# Patient Record
Sex: Male | Born: 1965 | Race: White | Hispanic: No | Smoking: Never smoker
Health system: Southern US, Community
[De-identification: ages and names within clinical notes are randomized; demographics above are authoritative.]

## PROBLEM LIST (undated history)

## (undated) DIAGNOSIS — L0292 Furuncle, unspecified: Secondary | ICD-10-CM

## (undated) DIAGNOSIS — M869 Osteomyelitis, unspecified: Secondary | ICD-10-CM

## (undated) DIAGNOSIS — E1140 Type 2 diabetes mellitus with diabetic neuropathy, unspecified: Secondary | ICD-10-CM

## (undated) DIAGNOSIS — E349 Endocrine disorder, unspecified: Secondary | ICD-10-CM

## (undated) DIAGNOSIS — I4891 Unspecified atrial fibrillation: Secondary | ICD-10-CM

## (undated) DIAGNOSIS — M14679 Charcot's joint, unspecified ankle and foot: Secondary | ICD-10-CM

## (undated) DIAGNOSIS — L0293 Carbuncle, unspecified: Secondary | ICD-10-CM

## (undated) DIAGNOSIS — I10 Essential (primary) hypertension: Secondary | ICD-10-CM

## (undated) DIAGNOSIS — G63 Polyneuropathy in diseases classified elsewhere: Secondary | ICD-10-CM

## (undated) DIAGNOSIS — I480 Paroxysmal atrial fibrillation: Secondary | ICD-10-CM

## (undated) DIAGNOSIS — I499 Cardiac arrhythmia, unspecified: Secondary | ICD-10-CM

## (undated) HISTORY — DX: Essential (primary) hypertension: I10

## (undated) HISTORY — DX: Paroxysmal atrial fibrillation: I48.0

## (undated) HISTORY — PX: FOOT AMPUTATION: SHX951

## (undated) HISTORY — DX: Endocrine disorder, unspecified: E34.9

## (undated) HISTORY — DX: Polyneuropathy in diseases classified elsewhere: G63

## (undated) HISTORY — PX: HERNIA REPAIR: SHX51

---
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
## 1998-04-14 ENCOUNTER — Encounter: Admission: RE | Admit: 1998-04-14 | Discharge: 1998-07-13 | Payer: Self-pay

## 2003-12-11 ENCOUNTER — Ambulatory Visit (HOSPITAL_BASED_OUTPATIENT_CLINIC_OR_DEPARTMENT_OTHER): Admission: RE | Admit: 2003-12-11 | Discharge: 2003-12-11 | Payer: Self-pay | Admitting: General Surgery

## 2005-06-14 ENCOUNTER — Emergency Department (HOSPITAL_COMMUNITY): Admission: EM | Admit: 2005-06-14 | Discharge: 2005-06-14 | Payer: Self-pay | Admitting: Emergency Medicine

## 2009-01-20 ENCOUNTER — Emergency Department (HOSPITAL_BASED_OUTPATIENT_CLINIC_OR_DEPARTMENT_OTHER): Admission: EM | Admit: 2009-01-20 | Discharge: 2009-01-20 | Payer: Self-pay | Admitting: Emergency Medicine

## 2009-03-14 ENCOUNTER — Emergency Department (HOSPITAL_BASED_OUTPATIENT_CLINIC_OR_DEPARTMENT_OTHER): Admission: EM | Admit: 2009-03-14 | Discharge: 2009-03-14 | Payer: Self-pay | Admitting: Emergency Medicine

## 2009-10-12 ENCOUNTER — Ambulatory Visit: Payer: Self-pay | Admitting: Diagnostic Radiology

## 2009-10-12 ENCOUNTER — Emergency Department (HOSPITAL_BASED_OUTPATIENT_CLINIC_OR_DEPARTMENT_OTHER): Admission: EM | Admit: 2009-10-12 | Discharge: 2009-10-12 | Payer: Self-pay | Admitting: Emergency Medicine

## 2011-02-14 LAB — CULTURE, ROUTINE-ABSCESS

## 2011-03-24 NOTE — Op Note (Signed)
[name], [name]                           ACCOUNT NO.:  [id number]   MEDICAL RECORD NO.:  [id number]                   PATIENT TYPE:  AMB   LOCATION:  DSC                                  FACILITY:  [facility]   PHYSICIAN:  Jimmye Norman III, M.D.               DATE OF BIRTH:  [birth year]   DATE OF PROCEDURE:  12/11/2003  DATE OF DISCHARGE:                                 OPERATIVE REPORT   PREOPERATIVE DIAGNOSIS:  Bilateral inguinal hernias.   POSTOPERATIVE DIAGNOSIS:  Recurrent left inguinal hernia and right inguinal  hernia.   PROCEDURE:  Bilateral inguinal hernia repair with mesh.   SURGEON:  Jimmye Norman, M.D.   ASSISTANT:  None.   ANESTHESIA:  General with a laryngeal airway.   ESTIMATED BLOOD LOSS:  Less than 50 mL.   COMPLICATIONS:  None.   CONDITION:  Stable.   FINDINGS:  The patient had bilateral direct and a right-sided indirect  hernia with a lipoma of the cord.   OPERATION:  The patient was taken to the operating room and placed on the  table in the supine position.  After an adequate laryngeal airway anesthetic  was administered, he was prepped and draped in the usual sterile manner  exposing both groins.   A left-sided incision was made initially at the site of his previous  inguinal hernia repair.  It was taken down to and through the subcutaneous  tissue using electrocautery, then down to and through the external oblique  fascia down through the superficial ring.  The spermatic cord on the left  side had been skeletonized from his previous operation.  There was no  apparent mesh in place.  We were able to see easily the very bulbous direct  hernia defect on the left side, which we repaired using mesh.   We explored the cord for an indirect sac, and none was found.  With  appropriate retractors in place, we were able to imbricate the direct hernia  on itself using 0 Ethibond sutures, then subsequently placed an oval piece  of mesh measuring approximately 6  x 3 cm in size from the pubic tubercle  along the reflected portion of the inguinal ligament inferolaterally and the  conjoined tendon anteromedially.   We stopped the sutures of the old Prolene attaching the mesh at the deep  inguinal ring where we tied it to itself.  We used antibiotic solution to  soak the mesh in prior to implantation and also to irrigate with.   Once the mesh was sewn into place we allowed the cord to fall back into the  canal and then we reapproximated the external oblique fascia using a running  3-0 Vicryl suture.  Once this was done, Scarpa's fascia was reapproximated  using interrupted 3-0 Vicryl sutures and then the skin was closed using a  running subcuticular stitch of 4-0 Prolene, which had to be replaced  with a  3-0 Prolene after that suture broke and the incision came apart.   On the right side there was almost an identical repair; however, upon  placing the spermatic cord up on a workbench on the right side, we found  that there was an indirect sac and a lipoma of the cord which was resected  and ligated at its base using a suture ligature of 0 Ethibond suture.  The  direct sac was imbricated it was on the left side using the 0 Ethibond  suture, then we implanted an oval piece of mesh starting at the pubic  tubercle, anteromedially attaching it to the conjoined tendon and  inferolaterally to the reflected portion of the inguinal ligament.   Once this was done, we tied the 0 Prolene to itself and then we irrigated  with antibiotic solution.  The external oblique fascia was closed using  running 3-0 Vicryl on top of the spermatic cord, then the subcu Scarpa's  fascia reapproximated using interrupted 3-0 Vicryl sutures.  Marcaine 0.5%  without epi was injected into the wound and into the incision on both sides;  a total of 30 cc was used.  We did close the skin on the right side using a  running subcuticular stitch of 4-0 Prolene.  The sterile dressings  were  applied and the patient was taken to the recovery room.                                               Kathrin Ruddy, M.D.    JW/MEDQ  D:  12/11/2003  T:  12/12/2003  Job:  [id number]

## 2011-06-12 DIAGNOSIS — E119 Type 2 diabetes mellitus without complications: Secondary | ICD-10-CM | POA: Insufficient documentation

## 2011-06-12 DIAGNOSIS — I10 Essential (primary) hypertension: Secondary | ICD-10-CM | POA: Insufficient documentation

## 2011-06-12 DIAGNOSIS — L0201 Cutaneous abscess of face: Secondary | ICD-10-CM | POA: Insufficient documentation

## 2011-06-12 DIAGNOSIS — L03211 Cellulitis of face: Secondary | ICD-10-CM | POA: Insufficient documentation

## 2011-06-13 ENCOUNTER — Encounter: Payer: Self-pay | Admitting: *Deleted

## 2011-06-13 ENCOUNTER — Emergency Department (HOSPITAL_BASED_OUTPATIENT_CLINIC_OR_DEPARTMENT_OTHER)
Admission: EM | Admit: 2011-06-13 | Discharge: 2011-06-13 | Disposition: A | Discharge status: Home / Self Care | Payer: BC Managed Care – PPO | Attending: Emergency Medicine | Admitting: Emergency Medicine

## 2011-06-13 DIAGNOSIS — L0201 Cutaneous abscess of face: Secondary | ICD-10-CM

## 2011-06-13 HISTORY — DX: Essential (primary) hypertension: I10

## 2011-06-13 HISTORY — DX: Furuncle, unspecified: L02.92

## 2011-06-13 MED ORDER — LIDOCAINE-EPINEPHRINE 2 %-1:100000 IJ SOLN
20.0000 mL | Freq: Once | INTRAMUSCULAR | Status: AC
  Administered 2011-06-13: 20 mL

## 2011-06-13 MED ORDER — DOXYCYCLINE HYCLATE 100 MG PO TABS
100.0000 mg | ORAL_TABLET | Freq: Once | ORAL | Status: AC
  Administered 2011-06-13: 100 mg via ORAL
  Filled 2011-06-13: qty 1

## 2011-06-13 MED ORDER — DOXYCYCLINE HYCLATE 100 MG PO CAPS: 100.0000 mg | ORAL_CAPSULE | Freq: Two times a day (BID) | ORAL | Status: AC

## 2011-06-13 NOTE — ED Notes (Signed)
Pt just started on BP meds, has not taken any tonight

## 2011-06-13 NOTE — ED Provider Notes (Signed)
History     CSN: [id number] Arrival date & time: 06/13/2011 12:56 AM  Chief Complaint  Patient presents with  . Recurrent Skin Infections   Patient is a 45 y.o. male presenting with abscess.  Abscess  This is a new problem. Episode onset: 3-4 days ago. The problem has been gradually worsening. The abscess is present on the face. The problem is moderate. The abscess is characterized by painfulness. Pertinent negatives include no fever.  pt states has hx 'skin infections' including to various other areas of face in past, although not current area in past. States has had areas 'lanced' in past. Denies hx mrsa. No fever or chills. Does not feel ill. No nv. Denies bite or sting. No injury to area. Is localized to left cheek. No eye involvement or pain. No oral/mouth pain. No headache.   Past Medical History  Diagnosis Date  . Boil     numerous boils in past  . Diabetes mellitus   . Hypertension     Past Surgical History  Procedure Date  . Hernia repair     History reviewed. No pertinent family history.  History  Substance Use Topics  . Smoking status: Never Smoker   . Smokeless tobacco: Not on file  . Alcohol Use: No      Review of Systems  Constitutional: Negative for fever.  HENT: Negative for neck pain.   Eyes: Negative for pain.  Respiratory: Negative for shortness of breath.   Cardiovascular: Negative for chest pain.  Gastrointestinal: Negative for abdominal pain.  Musculoskeletal: Negative for myalgias and arthralgias.  Skin: Negative for rash.  Neurological: Negative for headaches.  Hematological: Negative for adenopathy. Does not bruise/bleed easily.    Physical Exam  BP 154/106  Pulse 95  Temp(Src) 99.1 F (37.3 C) (Oral)  Resp 20  Ht 6' 4.5" (1.943 m)  Wt 248 lb (112.492 kg)  BMI 29.79 kg/m2  SpO2 97%  Physical Exam  Nursing note and vitals reviewed. Constitutional: He is oriented to person, place, and time. He appears well-developed and  well-nourished. No distress.  HENT:  Head: Atraumatic.       Area of induration, redness, warmth, tenderness to left cheek approx 3-4 cm diameter, coming to tiny head. No oral/mucosal lesions seen. No orbital or periorbital involvement.   Eyes: Conjunctivae are normal. Pupils are equal, round, and reactive to light.  Neck: Neck supple. No tracheal deviation present.  Cardiovascular: Normal rate.   Pulmonary/Chest: Effort normal. No accessory muscle usage. No respiratory distress.  Abdominal: He exhibits no distension.  Musculoskeletal: Normal range of motion.  Lymphadenopathy:    He has no cervical adenopathy.  Neurological: He is alert and oriented to person, place, and time.  Skin: Skin is warm and dry.  Psychiatric: He has a normal mood and affect.    ED Course  INCISION AND DRAINAGE Date/Time: 06/13/2011 3:06 AM Performed by: Suzi Roots Authorized by: Cathren Laine E Consent: Verbal consent obtained. Risks and benefits: risks, benefits and alternatives were discussed Consent given by: patient Type: abscess Body area: head/neck Location details: face Anesthesia: local infiltration Local anesthetic: lidocaine 1% with epinephrine Anesthetic total: 2 ml Patient sedated: no Scalpel size: 11 Needle gauge: 18 Incision type: single straight Drainage: purulent Drainage amount: moderate Wound treatment: wound left open Patient tolerance: Patient tolerated the procedure well with no immediate complications.    MDM Allergy to pcns. Doxycycline po given. Motrin po. I and D.   Pt tolerated I and D well. As  on face, relatively small incision made. Expressed pus, blunt dis to break loculations, additional amt pus drainage. Swelling improved. Pain relieved.     Suzi Roots, MD 06/13/11 [id number]

## 2011-06-13 NOTE — ED Notes (Signed)
Red area to left cheek for few days, now swelling all the way up to left eye

## 2011-07-06 ENCOUNTER — Ambulatory Visit: Payer: BC Managed Care – PPO | Attending: Family Medicine | Admitting: Physical Therapy

## 2011-07-06 DIAGNOSIS — R5381 Other malaise: Secondary | ICD-10-CM | POA: Insufficient documentation

## 2011-07-06 DIAGNOSIS — IMO0001 Reserved for inherently not codable concepts without codable children: Secondary | ICD-10-CM | POA: Insufficient documentation

## 2011-07-06 DIAGNOSIS — M25569 Pain in unspecified knee: Secondary | ICD-10-CM | POA: Insufficient documentation

## 2011-07-12 ENCOUNTER — Encounter: Payer: BC Managed Care – PPO | Admitting: Physical Therapy

## 2011-07-20 ENCOUNTER — Ambulatory Visit: Payer: BC Managed Care – PPO | Attending: Family Medicine | Admitting: Physical Therapy

## 2011-07-20 DIAGNOSIS — IMO0001 Reserved for inherently not codable concepts without codable children: Secondary | ICD-10-CM | POA: Insufficient documentation

## 2011-07-20 DIAGNOSIS — M25569 Pain in unspecified knee: Secondary | ICD-10-CM | POA: Insufficient documentation

## 2011-07-20 DIAGNOSIS — R5381 Other malaise: Secondary | ICD-10-CM | POA: Insufficient documentation

## 2011-07-26 ENCOUNTER — Encounter: Payer: BC Managed Care – PPO | Admitting: Physical Therapy

## 2012-08-22 ENCOUNTER — Ambulatory Visit (INDEPENDENT_AMBULATORY_CARE_PROVIDER_SITE_OTHER): Payer: Self-pay | Admitting: General Surgery

## 2012-11-11 ENCOUNTER — Other Ambulatory Visit: Payer: Self-pay | Admitting: Family Medicine

## 2012-11-11 ENCOUNTER — Ambulatory Visit (HOSPITAL_COMMUNITY)
Admission: RE | Admit: 2012-11-11 | Discharge: 2012-11-11 | Disposition: A | Discharge status: Home / Self Care | Payer: BC Managed Care – PPO | Source: Ambulatory Visit | Attending: Family Medicine | Admitting: Family Medicine

## 2012-11-11 DIAGNOSIS — R109 Unspecified abdominal pain: Secondary | ICD-10-CM

## 2012-11-11 DIAGNOSIS — R319 Hematuria, unspecified: Secondary | ICD-10-CM

## 2012-11-11 DIAGNOSIS — R1031 Right lower quadrant pain: Secondary | ICD-10-CM | POA: Insufficient documentation

## 2013-03-03 ENCOUNTER — Telehealth: Payer: Self-pay | Admitting: Nurse Practitioner

## 2013-03-03 ENCOUNTER — Encounter: Payer: Self-pay | Admitting: Nurse Practitioner

## 2013-03-03 ENCOUNTER — Ambulatory Visit (INDEPENDENT_AMBULATORY_CARE_PROVIDER_SITE_OTHER): Payer: BC Managed Care – PPO | Admitting: Nurse Practitioner

## 2013-03-03 VITALS — BP 158/102 | HR 74 | Temp 96.6°F | Ht 76.0 in | Wt 230.0 lb

## 2013-03-03 DIAGNOSIS — M5137 Other intervertebral disc degeneration, lumbosacral region: Secondary | ICD-10-CM

## 2013-03-03 DIAGNOSIS — E119 Type 2 diabetes mellitus without complications: Secondary | ICD-10-CM | POA: Insufficient documentation

## 2013-03-03 DIAGNOSIS — E118 Type 2 diabetes mellitus with unspecified complications: Secondary | ICD-10-CM

## 2013-03-03 DIAGNOSIS — I10 Essential (primary) hypertension: Secondary | ICD-10-CM

## 2013-03-03 DIAGNOSIS — I152 Hypertension secondary to endocrine disorders: Secondary | ICD-10-CM | POA: Insufficient documentation

## 2013-03-03 DIAGNOSIS — E1165 Type 2 diabetes mellitus with hyperglycemia: Secondary | ICD-10-CM

## 2013-03-03 DIAGNOSIS — M5136 Other intervertebral disc degeneration, lumbar region: Secondary | ICD-10-CM

## 2013-03-03 MED ORDER — CIPROFLOXACIN HCL 500 MG PO TABS: 500.0000 mg | ORAL_TABLET | Freq: Two times a day (BID) | ORAL | Status: DC

## 2013-03-03 NOTE — Telephone Encounter (Signed)
appt made for thurs at 8:30am with [facility]

## 2013-03-03 NOTE — Patient Instructions (Signed)
Epsom salt soaks BID Keep wound clean and dry Duoderm dialy Limit standing to 4 hours at work X 1 week Follow up Thursday

## 2013-03-03 NOTE — Progress Notes (Signed)
  Subjective:    Patient ID: [name], male    DOB: 07-19-1966, 47 y.o.   MRN: [id number]  HPI Patient has a large wound on bottom of right big toe. He is a diabetic and this started out as a blister on bottom of toe that he popped and now it os a big wound. It is tender to touch.    Review of Systems  All other systems reviewed and are negative.       Objective:   Physical Exam  Constitutional: He appears well-developed and well-nourished.  Cardiovascular: Normal rate and normal heart sounds.   Pulmonary/Chest: Effort normal and breath sounds normal.  Skin:  3 cm open wound bottom of right big toe. Surrounding tissue pale and tender to touch with central raw looking area.   BP 158/102  Pulse 74  Temp(Src) 96.6 F (35.9 C) (Oral)  Ht 6\' 4"  (1.93 m)  Wt 230 lb (104.327 kg)  BMI 28.01 kg/m2        Assessment & Plan:  Open wound right foot/ Diabetic Limit standing on foot for no more than 4 hours at work for at least 1 week Soak in epsom salt BID Do not pick at area. Duoderm daily RTO Thursday for recheck. - Cipro as ordered Mary-Margaret Daphine Deutscher, FNP

## 2013-03-04 ENCOUNTER — Telehealth: Payer: Self-pay | Admitting: Nurse Practitioner

## 2013-03-04 NOTE — Telephone Encounter (Signed)
How long does he need a note for?

## 2013-03-04 NOTE — Telephone Encounter (Signed)
Mmm to see ? Need note

## 2013-03-04 NOTE — Telephone Encounter (Signed)
Pt states that he doesn't need note.

## 2013-03-06 ENCOUNTER — Encounter: Payer: Self-pay | Admitting: Nurse Practitioner

## 2013-03-06 ENCOUNTER — Ambulatory Visit (INDEPENDENT_AMBULATORY_CARE_PROVIDER_SITE_OTHER): Payer: BC Managed Care – PPO | Admitting: Nurse Practitioner

## 2013-03-06 VITALS — BP 138/94 | HR 82 | Temp 98.0°F | Ht 76.0 in | Wt 224.0 lb

## 2013-03-06 DIAGNOSIS — S91101S Unspecified open wound of right great toe without damage to nail, sequela: Secondary | ICD-10-CM

## 2013-03-06 DIAGNOSIS — E119 Type 2 diabetes mellitus without complications: Secondary | ICD-10-CM

## 2013-03-06 DIAGNOSIS — IMO0002 Reserved for concepts with insufficient information to code with codable children: Secondary | ICD-10-CM

## 2013-03-06 NOTE — Progress Notes (Signed)
  Subjective:    Patient ID: [name], male    DOB: [birth year], 47 y.o.   MRN: [id number]  HPI Patient was seen Monday with a sore on bottom of right great toe. Patient is a diabetic which complicates the situation. He has been using duoderm on it which has helped. But his steel toe shoes that he wears at work puts pressure on area and makes it worse. Needs to be out of work for at least 1 week.    Review of Systems  All other systems reviewed and are negative.       Objective:   Physical Exam  Constitutional: He appears well-developed and well-nourished.  Cardiovascular: Normal rate and normal heart sounds.   Pulses:      Posterior tibial pulses are 2+ on the right side, and 2+ on the left side.  Pulmonary/Chest: Effort normal and breath sounds normal.  Skin:  3 cm annular erytematous area with 2cm central open raw area. Some of redness has improved since last visit.   BP 138/94  Pulse 82  Temp(Src) 98 F (36.7 C) (Oral)  Ht 6\' 4"  (1.93 m)  Wt 224 lb (101.606 kg)  BMI 27.28 kg/m2        Assessment & Plan:  Healing wound Right great toe  Continue Duoderm  Continue epsom salt soaks BID  recheck in 1 week  Stay off of it as much as possible Mary-Margaret Daphine Deutscher, FNP

## 2013-03-12 ENCOUNTER — Encounter: Payer: Self-pay | Admitting: Nurse Practitioner

## 2013-03-12 ENCOUNTER — Ambulatory Visit (INDEPENDENT_AMBULATORY_CARE_PROVIDER_SITE_OTHER): Payer: BC Managed Care – PPO | Admitting: Nurse Practitioner

## 2013-03-12 VITALS — BP 139/92 | HR 86 | Temp 98.8°F | Ht 76.0 in | Wt 225.0 lb

## 2013-03-12 DIAGNOSIS — IMO0002 Reserved for concepts with insufficient information to code with codable children: Secondary | ICD-10-CM

## 2013-03-12 DIAGNOSIS — S91101S Unspecified open wound of right great toe without damage to nail, sequela: Secondary | ICD-10-CM

## 2013-03-12 NOTE — Progress Notes (Signed)
  Subjective:    Patient ID: [name], male    DOB: [birth year], 47 y.o.   MRN: [id number]  HPI Patient in for recheck of wound right great toe. It has been there for about 2 weeks.Seems to be healing slowly. No drainage to speak of. STill tender to touch.    Review of Systems  All other systems reviewed and are negative.       Objective:   Physical Exam  Constitutional: He appears well-developed and well-nourished.  Cardiovascular: Normal rate and normal heart sounds.   Pulmonary/Chest: Effort normal and breath sounds normal.  Skin:  3cm hard yellowish dead tissue with central 2 cm opening.          Assessment & Plan:  Slowly healing wound right great toe/ Diabetes  To wound care center-ASAP  Mary-Margaret Daphine Deutscher, FNP

## 2013-03-17 ENCOUNTER — Telehealth: Payer: Self-pay | Admitting: Nurse Practitioner

## 2013-03-17 NOTE — Telephone Encounter (Signed)
Need chart to see if form is on chart

## 2013-03-17 NOTE — Telephone Encounter (Signed)
[place] Coaster have we received form yet

## 2013-03-19 ENCOUNTER — Encounter: Payer: Self-pay | Admitting: *Deleted

## 2013-03-19 NOTE — Telephone Encounter (Signed)
Pt picked up work note today. Disability forms also faxed today

## 2013-03-19 NOTE — Telephone Encounter (Signed)
Mmm to address 

## 2013-04-02 ENCOUNTER — Telehealth: Payer: Self-pay | Admitting: Nurse Practitioner

## 2013-04-02 MED ORDER — HYDROCODONE-ACETAMINOPHEN 10-325 MG PO TABS: 1.0000 | ORAL_TABLET | Freq: Four times a day (QID) | ORAL | Status: DC | PRN

## 2013-04-02 NOTE — Telephone Encounter (Signed)
Was given #20 of Norco on 5/13 at [facility]

## 2013-04-02 NOTE — Telephone Encounter (Signed)
Find out from pharmacy when was filled last and how much

## 2013-04-02 NOTE — Telephone Encounter (Signed)
Ready for pick up

## 2013-04-02 NOTE — Telephone Encounter (Signed)
rx up front pt aware 

## 2013-04-02 NOTE — Telephone Encounter (Signed)
Mmm to address 

## 2013-04-03 ENCOUNTER — Telehealth: Payer: Self-pay | Admitting: Nurse Practitioner

## 2013-04-03 MED ORDER — CIPROFLOXACIN HCL 500 MG PO TABS: 500.0000 mg | ORAL_TABLET | Freq: Two times a day (BID) | ORAL | Status: DC

## 2013-04-03 NOTE — Telephone Encounter (Signed)
Please advise 

## 2013-04-03 NOTE — Telephone Encounter (Signed)
Antibiotic rx sent to pharmacy

## 2013-04-04 ENCOUNTER — Telehealth: Payer: Self-pay | Admitting: Nurse Practitioner

## 2013-04-04 NOTE — Telephone Encounter (Signed)
Pt aware.

## 2013-04-04 NOTE — Telephone Encounter (Signed)
Spoke with patient.

## 2013-07-22 ENCOUNTER — Encounter (HOSPITAL_BASED_OUTPATIENT_CLINIC_OR_DEPARTMENT_OTHER): Payer: Self-pay | Admitting: *Deleted

## 2013-07-22 ENCOUNTER — Emergency Department (HOSPITAL_BASED_OUTPATIENT_CLINIC_OR_DEPARTMENT_OTHER): Payer: BC Managed Care – PPO

## 2013-07-22 ENCOUNTER — Emergency Department (HOSPITAL_BASED_OUTPATIENT_CLINIC_OR_DEPARTMENT_OTHER)
Admission: EM | Admit: 2013-07-22 | Discharge: 2013-07-23 | Disposition: A | Discharge status: Home / Self Care | Payer: BC Managed Care – PPO | Attending: Emergency Medicine | Admitting: Emergency Medicine

## 2013-07-22 DIAGNOSIS — M1990 Unspecified osteoarthritis, unspecified site: Secondary | ICD-10-CM

## 2013-07-22 DIAGNOSIS — Z792 Long term (current) use of antibiotics: Secondary | ICD-10-CM | POA: Insufficient documentation

## 2013-07-22 DIAGNOSIS — M19029 Primary osteoarthritis, unspecified elbow: Secondary | ICD-10-CM | POA: Insufficient documentation

## 2013-07-22 DIAGNOSIS — I10 Essential (primary) hypertension: Secondary | ICD-10-CM | POA: Insufficient documentation

## 2013-07-22 DIAGNOSIS — Z880 Allergy status to penicillin: Secondary | ICD-10-CM | POA: Insufficient documentation

## 2013-07-22 DIAGNOSIS — Z79899 Other long term (current) drug therapy: Secondary | ICD-10-CM | POA: Insufficient documentation

## 2013-07-22 DIAGNOSIS — E119 Type 2 diabetes mellitus without complications: Secondary | ICD-10-CM | POA: Insufficient documentation

## 2013-07-22 DIAGNOSIS — Z872 Personal history of diseases of the skin and subcutaneous tissue: Secondary | ICD-10-CM | POA: Insufficient documentation

## 2013-07-22 NOTE — ED Notes (Signed)
Right elbow pain for a few weeks. Pain radiates into his right shoulder. States he lifts boxes at work and has dislocated his shoulder in the past. Drove himself here from [place].

## 2013-07-23 MED ORDER — MELOXICAM 7.5 MG PO TABS: 7.5000 mg | ORAL_TABLET | Freq: Every day | ORAL | Status: DC

## 2013-07-23 MED ORDER — IBUPROFEN 800 MG PO TABS
800.0000 mg | ORAL_TABLET | Freq: Once | ORAL | Status: AC
  Administered 2013-07-23: 800 mg via ORAL
  Filled 2013-07-23: qty 1

## 2013-07-23 NOTE — ED Notes (Signed)
MD at bedside. 
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
## 2013-09-02 DIAGNOSIS — Y929 Unspecified place or not applicable: Secondary | ICD-10-CM | POA: Insufficient documentation

## 2013-09-02 DIAGNOSIS — M4802 Spinal stenosis, cervical region: Secondary | ICD-10-CM | POA: Insufficient documentation

## 2013-09-02 DIAGNOSIS — Y9389 Activity, other specified: Secondary | ICD-10-CM | POA: Insufficient documentation

## 2013-09-02 DIAGNOSIS — M5412 Radiculopathy, cervical region: Secondary | ICD-10-CM | POA: Insufficient documentation

## 2013-09-02 DIAGNOSIS — X500XXA Overexertion from strenuous movement or load, initial encounter: Secondary | ICD-10-CM | POA: Insufficient documentation

## 2013-09-02 DIAGNOSIS — Z792 Long term (current) use of antibiotics: Secondary | ICD-10-CM | POA: Insufficient documentation

## 2013-09-02 DIAGNOSIS — S0990XA Unspecified injury of head, initial encounter: Secondary | ICD-10-CM | POA: Insufficient documentation

## 2013-09-02 DIAGNOSIS — Z880 Allergy status to penicillin: Secondary | ICD-10-CM | POA: Insufficient documentation

## 2013-09-02 DIAGNOSIS — Z79899 Other long term (current) drug therapy: Secondary | ICD-10-CM | POA: Insufficient documentation

## 2013-09-02 DIAGNOSIS — E119 Type 2 diabetes mellitus without complications: Secondary | ICD-10-CM | POA: Insufficient documentation

## 2013-09-02 DIAGNOSIS — I10 Essential (primary) hypertension: Secondary | ICD-10-CM | POA: Insufficient documentation

## 2013-09-02 DIAGNOSIS — Z791 Long term (current) use of non-steroidal anti-inflammatories (NSAID): Secondary | ICD-10-CM | POA: Insufficient documentation
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
## 2013-09-03 ENCOUNTER — Ambulatory Visit (INDEPENDENT_AMBULATORY_CARE_PROVIDER_SITE_OTHER): Payer: BC Managed Care – PPO | Admitting: Nurse Practitioner

## 2013-09-03 ENCOUNTER — Emergency Department (HOSPITAL_BASED_OUTPATIENT_CLINIC_OR_DEPARTMENT_OTHER): Payer: BC Managed Care – PPO

## 2013-09-03 ENCOUNTER — Emergency Department (HOSPITAL_BASED_OUTPATIENT_CLINIC_OR_DEPARTMENT_OTHER)
Admission: EM | Admit: 2013-09-03 | Discharge: 2013-09-03 | Disposition: A | Discharge status: Home / Self Care | Payer: BC Managed Care – PPO | Attending: Emergency Medicine | Admitting: Emergency Medicine

## 2013-09-03 VITALS — BP 145/94 | HR 91 | Temp 97.5°F | Ht 74.0 in | Wt 229.0 lb

## 2013-09-03 DIAGNOSIS — M5412 Radiculopathy, cervical region: Secondary | ICD-10-CM

## 2013-09-03 DIAGNOSIS — M4802 Spinal stenosis, cervical region: Secondary | ICD-10-CM

## 2013-09-03 DIAGNOSIS — M436 Torticollis: Secondary | ICD-10-CM

## 2013-09-03 MED ORDER — CYCLOBENZAPRINE HCL 5 MG PO TABS: 5.0000 mg | ORAL_TABLET | Freq: Three times a day (TID) | ORAL | Status: DC | PRN

## 2013-09-03 MED ORDER — HYDROCODONE-ACETAMINOPHEN 5-325 MG PO TABS: 1.0000 | ORAL_TABLET | Freq: Four times a day (QID) | ORAL | Status: DC | PRN

## 2013-09-03 MED ORDER — IBUPROFEN 800 MG PO TABS: 800.0000 mg | ORAL_TABLET | Freq: Three times a day (TID) | ORAL | Status: DC | PRN

## 2013-09-03 NOTE — ED Notes (Signed)
Patient transported to CT 

## 2013-09-03 NOTE — Patient Instructions (Signed)
Torticollis, Acute °You have suddenly (acutely) developed a twisted neck (torticollis). This is usually a self-limited condition. °CAUSES  °Acute torticollis may be caused by malposition, trauma or infection. Most commonly, acute torticollis is caused by sleeping in an awkward position. Torticollis may also be caused by the flexion, extension or twisting of the neck muscles beyond their normal position. Sometimes, the exact cause may not be known. °SYMPTOMS  °Usually, there is pain and limited movement of the neck. Your neck may twist to one side. °DIAGNOSIS  °The diagnosis is often made by physical examination. X-rays, CT scans or MRIs may be done if there is a history of trauma or concern of infection. °TREATMENT  °For a common, stiff neck that develops during sleep, treatment is focused on relaxing the contracted neck muscle. Medications (including shots) may be used to treat the problem. Most cases resolve in several days. Torticollis usually responds to conservative physical therapy. If left untreated, the shortened and spastic neck muscle can cause deformities in the face and neck. Rarely, surgery is required. °HOME CARE INSTRUCTIONS  °· Use over-the-counter and prescription medications as directed by your caregiver. °· Do stretching exercises and massage the neck as directed by your caregiver. °· Follow up with physical therapy if needed and as directed by your caregiver. °SEEK IMMEDIATE MEDICAL CARE IF:  °· You develop difficulty breathing or noisy breathing (stridor). °· You drool, develop trouble swallowing or have pain with swallowing. °· You develop numbness or weakness in the hands or feet. °· You have changes in speech or vision. °· You have problems with urination or bowel movements. °· You have difficulty walking. °· You have a fever. °· You have increased pain. °MAKE SURE YOU:  °· Understand these instructions. °· Will watch your condition. °· Will get help right away if you are not doing well or  get worse. °Document Released: 10/20/2000 Document Revised: 01/15/2012 Document Reviewed: 12/01/2009 °[employer]® Patient Information ©2014 [employer]. ° °
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
## 2013-09-03 NOTE — ED Notes (Signed)
MD at bedside. 
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
## 2013-09-22 ENCOUNTER — Telehealth: Payer: Self-pay | Admitting: *Deleted

## 2013-09-22 ENCOUNTER — Encounter: Payer: Self-pay | Admitting: Nurse Practitioner

## 2013-09-22 ENCOUNTER — Ambulatory Visit (INDEPENDENT_AMBULATORY_CARE_PROVIDER_SITE_OTHER): Payer: BC Managed Care – PPO | Admitting: Nurse Practitioner

## 2013-09-22 VITALS — BP 152/91 | HR 76 | Temp 97.6°F | Ht 74.0 in | Wt 225.0 lb

## 2013-09-22 DIAGNOSIS — E1165 Type 2 diabetes mellitus with hyperglycemia: Secondary | ICD-10-CM

## 2013-09-22 DIAGNOSIS — R7309 Other abnormal glucose: Secondary | ICD-10-CM

## 2013-09-22 DIAGNOSIS — I10 Essential (primary) hypertension: Secondary | ICD-10-CM

## 2013-09-22 DIAGNOSIS — R739 Hyperglycemia, unspecified: Secondary | ICD-10-CM

## 2013-09-22 LAB — POCT GLYCOSYLATED HEMOGLOBIN (HGB A1C): Hemoglobin A1C: 9.1

## 2013-09-22 LAB — POCT UA - MICROALBUMIN: Microalbumin Ur, POC: 20 mg/L

## 2013-09-22 MED ORDER — METFORMIN HCL ER (MOD) 1000 MG PO TB24: 1000.0000 mg | ORAL_TABLET | Freq: Two times a day (BID) | ORAL | Status: DC

## 2013-09-22 MED ORDER — LISINOPRIL 20 MG PO TABS: 20.0000 mg | ORAL_TABLET | Freq: Every day | ORAL | Status: DC

## 2013-09-22 NOTE — Patient Instructions (Signed)

## 2013-09-22 NOTE — Progress Notes (Signed)
Subjective:    Patient ID: [name], male    DOB: 15-Feb-1966, 47 y.o.   MRN: [id number]  Hypertension This is a chronic problem. The current episode started more than 1 year ago. The problem is unchanged. The problem is uncontrolled. Pertinent negatives include no blurred vision, chest pain, headaches, malaise/fatigue, orthopnea, palpitations, peripheral edema or shortness of breath. There are no associated agents to hypertension. Risk factors for coronary artery disease include diabetes mellitus and male gender. Past treatments include nothing. The current treatment provides no improvement. Compliance problems include diet and exercise.   Diabetes He presents for his follow-up diabetic visit. He has type 2 diabetes mellitus. No MedicAlert identification noted. His disease course has been fluctuating. Pertinent negatives for hypoglycemia include no headaches. Pertinent negatives for diabetes include no blurred vision, no chest pain, no polydipsia, no polyphagia, no polyuria, no weakness and no weight loss. Symptoms are worsening. He is compliant with treatment some of the time. He rarely participates in exercise. His home blood glucose trend is fluctuating dramatically. (Patient has not been checking blood sugaars) An ACE inhibitor/angiotensin II receptor blocker is not being taken. He does not see a podiatrist.Eye exam is not current.   Patient went to er last week and they checked his blood sugar and  was 264. They told hm needed to be checked by PCP- patient denies polyuria and,polydipsia. He says that he feels fine.    Review of Systems  Constitutional: Negative for weight loss and malaise/fatigue.  Eyes: Negative for blurred vision.  Respiratory: Negative for shortness of breath.   Cardiovascular: Negative for chest pain, palpitations and orthopnea.  Endocrine: Negative for polydipsia, polyphagia and polyuria.  Neurological: Negative for weakness and headaches.       Objective:    Physical Exam  Constitutional: He is oriented to person, place, and time. He appears well-developed and well-nourished.  HENT:  Head: Normocephalic.  Right Ear: External ear normal.  Left Ear: External ear normal.  Nose: Nose normal.  Mouth/Throat: Oropharynx is clear and moist.  Eyes: EOM are normal. Pupils are equal, round, and reactive to light.  Neck: Normal range of motion. Neck supple. No JVD present. No thyromegaly present.  Cardiovascular: Normal rate, regular rhythm, normal heart sounds and intact distal pulses.  Exam reveals no gallop and no friction rub.   No murmur heard. Pulmonary/Chest: Effort normal and breath sounds normal. No respiratory distress. He has no wheezes. He has no rales. He exhibits no tenderness.  Abdominal: Soft. Bowel sounds are normal. He exhibits no mass. There is no tenderness.  Genitourinary: Prostate normal and penis normal.  Musculoskeletal: Normal range of motion. He exhibits no edema.  Lymphadenopathy:    He has no cervical adenopathy.  Neurological: He is alert and oriented to person, place, and time. No cranial nerve deficit.  Skin: Skin is warm and dry.  Psychiatric: He has a normal mood and affect. His behavior is normal. Judgment and thought content normal.    BP 152/91  Pulse 76  Temp(Src) 97.6 F (36.4 C) (Oral)  Ht 6\' 2"  (1.88 m)  Wt 225 lb (102.059 kg)  BMI 28.88 kg/m2].  Results for orders placed in visit on 09/22/13  POCT GLYCOSYLATED HEMOGLOBIN (HGB A1C)      Result Value Range   Hemoglobin A1C 9.1    POCT UA - MICROALBUMIN      Result Value Range   Microalbumin Ur, POC 20         Assessment & Plan:  1. Hyperglycemia   2. Type II or unspecified type diabetes mellitus with unspecified complication, uncontrolled   3. Essential hypertension, benign    Orders Placed This Encounter  Procedures  . Microalbumin, urine  . POCT glycosylated hemoglobin (Hb A1C)  . POCT UA - Microalbumin   Meds ordered this encounter   Medications  . metFORMIN (GLUMETZA) 1000 MG (MOD) 24 hr tablet    Sig: Take 1 tablet (1,000 mg total) by mouth 2 (two) times daily with a meal.    Dispense:  60 tablet    Refill:  5    Order Specific Question:  Supervising Provider    Answer:  Ernestina Penna [[id number]]  . lisinopril (PRINIVIL,ZESTRIL) 20 MG tablet    Sig: Take 1 tablet (20 mg total) by mouth daily.    Dispense:  30 tablet    Refill:  5    Order Specific Question:  Supervising Provider    Answer:  Ernestina Penna [[id number]]   Patient has not been taking metformin like suppose to because he wanted to stretch meds out- instructed MUST take as rx Continue all meds Labs pending Diet and exercise encouraged Health maintenance reviewed Follow up in 3 months  Mary-Margaret Daphine Deutscher, FNP

## 2013-09-24 ENCOUNTER — Telehealth: Payer: Self-pay | Admitting: Nurse Practitioner

## 2013-09-29 ENCOUNTER — Ambulatory Visit: Payer: Self-pay | Admitting: Nurse Practitioner

## 2013-10-16 NOTE — Telephone Encounter (Signed)
Taken care of per note

## 2013-12-08 ENCOUNTER — Emergency Department (HOSPITAL_BASED_OUTPATIENT_CLINIC_OR_DEPARTMENT_OTHER)
Admission: EM | Admit: 2013-12-08 | Discharge: 2013-12-09 | Disposition: A | Discharge status: Home / Self Care | Payer: 59 | Attending: Emergency Medicine | Admitting: Emergency Medicine

## 2013-12-08 ENCOUNTER — Encounter (HOSPITAL_BASED_OUTPATIENT_CLINIC_OR_DEPARTMENT_OTHER): Payer: Self-pay | Admitting: Emergency Medicine

## 2013-12-08 DIAGNOSIS — R5383 Other fatigue: Secondary | ICD-10-CM

## 2013-12-08 DIAGNOSIS — E119 Type 2 diabetes mellitus without complications: Secondary | ICD-10-CM | POA: Insufficient documentation

## 2013-12-08 DIAGNOSIS — R05 Cough: Secondary | ICD-10-CM | POA: Insufficient documentation

## 2013-12-08 DIAGNOSIS — Z872 Personal history of diseases of the skin and subcutaneous tissue: Secondary | ICD-10-CM | POA: Insufficient documentation

## 2013-12-08 DIAGNOSIS — Z880 Allergy status to penicillin: Secondary | ICD-10-CM | POA: Insufficient documentation

## 2013-12-08 DIAGNOSIS — B349 Viral infection, unspecified: Secondary | ICD-10-CM

## 2013-12-08 DIAGNOSIS — Z79899 Other long term (current) drug therapy: Secondary | ICD-10-CM | POA: Insufficient documentation

## 2013-12-08 DIAGNOSIS — B9789 Other viral agents as the cause of diseases classified elsewhere: Secondary | ICD-10-CM | POA: Insufficient documentation

## 2013-12-08 DIAGNOSIS — R059 Cough, unspecified: Secondary | ICD-10-CM | POA: Insufficient documentation

## 2013-12-08 DIAGNOSIS — R5381 Other malaise: Secondary | ICD-10-CM | POA: Insufficient documentation

## 2013-12-08 DIAGNOSIS — R1110 Vomiting, unspecified: Secondary | ICD-10-CM | POA: Insufficient documentation

## 2013-12-08 DIAGNOSIS — I10 Essential (primary) hypertension: Secondary | ICD-10-CM | POA: Insufficient documentation

## 2013-12-08 MED ORDER — SODIUM CHLORIDE 0.9 % IV BOLUS (SEPSIS)
1000.0000 mL | Freq: Once | INTRAVENOUS | Status: AC
  Administered 2013-12-09: 1000 mL via INTRAVENOUS

## 2013-12-08 NOTE — ED Notes (Signed)
Dizziness esp when moving head,  Some vomiting,  Headache,  chills

## 2013-12-09 ENCOUNTER — Emergency Department (HOSPITAL_BASED_OUTPATIENT_CLINIC_OR_DEPARTMENT_OTHER): Payer: 59

## 2013-12-09 LAB — COMPREHENSIVE METABOLIC PANEL
ALT: 24 U/L (ref 0–53)
AST: 16 U/L (ref 0–37)
Albumin: 3.9 g/dL (ref 3.5–5.2)
Alkaline Phosphatase: 158 U/L — ABNORMAL HIGH (ref 39–117)
BUN: 15 mg/dL (ref 6–23)
CO2: 28 mEq/L (ref 19–32)
Calcium: 9.4 mg/dL (ref 8.4–10.5)
Chloride: 97 mEq/L (ref 96–112)
Creatinine, Ser: 1.1 mg/dL (ref 0.50–1.35)
GFR calc Af Amer: 89 mL/min — ABNORMAL LOW (ref >90)
GFR calc non Af Amer: 77 mL/min — ABNORMAL LOW (ref >90)
Glucose, Bld: 263 mg/dL — ABNORMAL HIGH (ref 70–99)
Potassium: 4.1 mEq/L (ref 3.7–5.3)
Sodium: 136 mEq/L — ABNORMAL LOW (ref 137–147)
Total Bilirubin: 0.7 mg/dL (ref 0.3–1.2)
Total Protein: 7.4 g/dL (ref 6.0–8.3)

## 2013-12-09 LAB — URINALYSIS, ROUTINE W REFLEX MICROSCOPIC
Bilirubin Urine: NEGATIVE
Glucose, UA: >1000 mg/dL — AB
Hgb urine dipstick: NEGATIVE
Ketones, ur: NEGATIVE mg/dL
Leukocytes, UA: NEGATIVE
Nitrite: NEGATIVE
Protein, ur: NEGATIVE mg/dL
Specific Gravity, Urine: 1.034 — ABNORMAL HIGH (ref 1.005–1.030)
Urobilinogen, UA: 0.2 mg/dL (ref 0.0–1.0)
pH: 5 (ref 5.0–8.0)

## 2013-12-09 LAB — URINE MICROSCOPIC-ADD ON

## 2013-12-09 LAB — CBC WITH DIFFERENTIAL/PLATELET
Basophils Absolute: 0 10*3/uL (ref 0.0–0.1)
Basophils Relative: 0 % (ref 0–1)
Eosinophils Absolute: 0 10*3/uL (ref 0.0–0.7)
Eosinophils Relative: 0 % (ref 0–5)
HCT: 46.5 % (ref 39.0–52.0)
Hemoglobin: 16.3 g/dL (ref 13.0–17.0)
Lymphocytes Relative: 5 % — ABNORMAL LOW (ref 12–46)
Lymphs Abs: 0.3 10*3/uL — ABNORMAL LOW (ref 0.7–4.0)
MCH: 31 pg (ref 26.0–34.0)
MCHC: 35.1 g/dL (ref 30.0–36.0)
MCV: 88.6 fL (ref 78.0–100.0)
Monocytes Absolute: 0.8 10*3/uL (ref 0.1–1.0)
Monocytes Relative: 14 % — ABNORMAL HIGH (ref 3–12)
Neutro Abs: 4.9 10*3/uL (ref 1.7–7.7)
Neutrophils Relative %: 81 % — ABNORMAL HIGH (ref 43–77)
Platelets: 145 10*3/uL — ABNORMAL LOW (ref 150–400)
RBC: 5.25 MIL/uL (ref 4.22–5.81)
RDW: 12.3 % (ref 11.5–15.5)
WBC: 6 10*3/uL (ref 4.0–10.5)

## 2013-12-09 MED ORDER — ACETAMINOPHEN 500 MG PO TABS
1000.0000 mg | ORAL_TABLET | Freq: Once | ORAL | Status: AC
  Administered 2013-12-09: 1000 mg via ORAL
  Filled 2013-12-09: qty 2

## 2013-12-09 MED ORDER — SODIUM CHLORIDE 0.9 % IV BOLUS (SEPSIS)
1000.0000 mL | Freq: Once | INTRAVENOUS | Status: AC
  Administered 2013-12-09: 1000 mL via INTRAVENOUS

## 2013-12-09 MED ORDER — OSELTAMIVIR PHOSPHATE 75 MG PO CAPS: 75.0000 mg | ORAL_CAPSULE | Freq: Two times a day (BID) | ORAL | Status: DC

## 2013-12-09 MED ORDER — OSELTAMIVIR PHOSPHATE 75 MG PO CAPS
75.0000 mg | ORAL_CAPSULE | Freq: Once | ORAL | Status: AC
  Administered 2013-12-09: 75 mg via ORAL

## 2013-12-09 NOTE — Discharge Instructions (Signed)

## 2013-12-09 NOTE — ED Provider Notes (Addendum)
Medical screening examination/treatment/procedure(s) were conducted as a shared visit with non-physician practitioner(s) and myself.  I personally evaluated the patient during the encounter.  EKG Interpretation   None       Pt with URI symptoms, myalgias, fatigue, lightheadedness, one episode of vomiting.  No CP or SOB.  +chills, but no known fever.  No CP or SOB.  No vertigo symptoms or neuro deficits.  No pneumonia on CXR. Will give fluids and reassess.  3:38 pt feeling much better, ambulated without problem, no ongoing dizziness.  Tachycardia has resolved.  Will d/c, start on tamiflu given other comordities.  F/u with his PMD  Rolan BuccoMelanie Govanni Plemons, MD 12/09/13 [phone]  Rolan BuccoMelanie Nahal Wanless, MD 12/09/13 [phone]

## 2013-12-09 NOTE — ED Provider Notes (Signed)
CSN: [id number]     Arrival date & time 12/08/13  2247 History   First MD Initiated Contact with Patient 12/08/13 2324     Chief Complaint  Patient presents with  . Dizziness   (Consider location/radiation/quality/duration/timing/severity/associated sxs/prior Treatment) HPI Patient presents emergency department with weakness, vomiting, and cough.  The patient, states, that around 8 PM tonight he began to feel dizzy and vomited times one.  The patient denies chest pain, shortness breath, headache blurred vision, neck pain, neck pain, fever, or syncope.  The patient, states he did feel like he might pass out.  Patient, states nothing seems to make his condition, better or worse.  Patient did not take any medications prior to arrival for his symptoms Past Medical History  Diagnosis Date  . Boil     numerous boils in past  . Diabetes mellitus   . Hypertension    Past Surgical History  Procedure Laterality Date  . Hernia repair     Family History  Problem Relation Age of Onset  . Stroke Father    History  Substance Use Topics  . Smoking status: Never Smoker   . Smokeless tobacco: Not on file  . Alcohol Use: No    Review of Systems All other systems negative except as documented in the HPI. All pertinent positives and negatives as reviewed in the HPI.  Allergies  Penicillins  Home Medications   Current Outpatient Rx  Name  Route  Sig  Dispense  Refill  . HYDROcodone-acetaminophen (NORCO) 10-325 MG per tablet   Oral   Take 1 tablet by mouth every 6 (six) hours as needed for pain.   30 tablet   0   . ibuprofen (ADVIL,MOTRIN) 800 MG tablet   Oral   Take 1 tablet (800 mg total) by mouth every 8 (eight) hours as needed for pain.   40 tablet   0   . lisinopril (PRINIVIL,ZESTRIL) 20 MG tablet   Oral   Take 1 tablet (20 mg total) by mouth daily.   30 tablet   5   . metFORMIN (GLUMETZA) 1000 MG (MOD) 24 hr tablet   Oral   Take 1 tablet (1,000 mg total) by mouth 2 (two)  times daily with a meal.   60 tablet   5    BP 161/72  Pulse 118  Temp(Src) 99.1 F (37.3 C) (Oral)  SpO2 98% Physical Exam  Nursing note and vitals reviewed. Constitutional: He is oriented to person, place, and time. He appears well-developed and well-nourished. No distress.  HENT:  Head: Normocephalic and atraumatic.  Mouth/Throat: Oropharynx is clear and moist.  Eyes: Pupils are equal, round, and reactive to light.  Neck: Normal range of motion. Neck supple.  Cardiovascular: Normal rate, regular rhythm and normal heart sounds.  Exam reveals no gallop and no friction rub.   No murmur heard. Pulmonary/Chest: Effort normal and breath sounds normal. No respiratory distress.  Neurological: He is alert and oriented to person, place, and time. He exhibits normal muscle tone. Coordination normal.  Skin: Skin is warm and dry. No erythema.    ED Course  Procedures (including critical care time) Labs Review Labs Reviewed  COMPREHENSIVE METABOLIC PANEL - Abnormal; Notable for the following:    Sodium 136 (*)    Glucose, Bld 263 (*)    Alkaline Phosphatase 158 (*)    GFR calc non Af Amer 77 (*)    GFR calc Af Amer 89 (*)    All other components within  normal limits  CBC WITH DIFFERENTIAL - Abnormal; Notable for the following:    Platelets 145 (*)    Neutrophils Relative % 81 (*)    Lymphocytes Relative 5 (*)    Lymphs Abs 0.3 (*)    Monocytes Relative 14 (*)    All other components within normal limits  URINALYSIS, ROUTINE W REFLEX MICROSCOPIC - Abnormal; Notable for the following:    Specific Gravity, Urine 1.034 (*)    Glucose, UA >1000 (*)    All other components within normal limits  URINE MICROSCOPIC-ADD ON   Imaging Review Dg Chest 2 View  12/09/2013   CLINICAL DATA:  Weakness and dizziness.  EXAM: CHEST  2 VIEW  COMPARISON:  None.  FINDINGS: The lungs are clear. Heart size is normal. No pneumothorax or pleural fluid. No focal bony abnormality.  IMPRESSION: No acute  disease.   Electronically Signed   By: Drusilla Kannerhomas  Dalessio M.D.   On: 12/09/2013 00:43    Return here as needed. Follow up with your doctor. Increase his fluid intake. This is most likely a combination of dehydration and URI.   Carlyle Dollyhristopher W Tykira Wachs, PA-C 12/09/13 [phone]

## 2014-01-02 ENCOUNTER — Ambulatory Visit: Payer: Self-pay | Admitting: Nurse Practitioner

## 2014-03-09 ENCOUNTER — Telehealth: Payer: Self-pay | Admitting: Pharmacist

## 2014-03-09 ENCOUNTER — Ambulatory Visit (INDEPENDENT_AMBULATORY_CARE_PROVIDER_SITE_OTHER): Payer: 59 | Admitting: Family Medicine

## 2014-03-09 ENCOUNTER — Telehealth: Payer: Self-pay | Admitting: Family Medicine

## 2014-03-09 VITALS — BP 133/85 | HR 87 | Temp 99.8°F | Ht 74.0 in | Wt 218.0 lb

## 2014-03-09 DIAGNOSIS — R5383 Other fatigue: Secondary | ICD-10-CM

## 2014-03-09 DIAGNOSIS — R5381 Other malaise: Secondary | ICD-10-CM

## 2014-03-09 DIAGNOSIS — M14679 Charcot's joint, unspecified ankle and foot: Secondary | ICD-10-CM

## 2014-03-09 DIAGNOSIS — G609 Hereditary and idiopathic neuropathy, unspecified: Secondary | ICD-10-CM

## 2014-03-09 DIAGNOSIS — Z0000 Encounter for general adult medical examination without abnormal findings: Secondary | ICD-10-CM

## 2014-03-09 DIAGNOSIS — A5211 Tabes dorsalis: Secondary | ICD-10-CM

## 2014-03-09 DIAGNOSIS — R350 Frequency of micturition: Secondary | ICD-10-CM

## 2014-03-09 DIAGNOSIS — E119 Type 2 diabetes mellitus without complications: Secondary | ICD-10-CM

## 2014-03-09 LAB — GLUCOSE, POCT (MANUAL RESULT ENTRY): POC Glucose: 322 mg/dl — AB (ref 70–99)

## 2014-03-09 LAB — POCT UA - MICROALBUMIN: Microalbumin Ur, POC: NEGATIVE mg/L

## 2014-03-09 LAB — POCT GLYCOSYLATED HEMOGLOBIN (HGB A1C): Hemoglobin A1C: 10.1

## 2014-03-09 MED ORDER — GABAPENTIN 300 MG PO CAPS: 300.0000 mg | ORAL_CAPSULE | Freq: Three times a day (TID) | ORAL | Status: DC

## 2014-03-09 NOTE — Addendum Note (Signed)
Addended by: Prescott GumLAND, Denym Christenberry M on: 03/09/2014 11:32 AM   Modules accepted: Orders

## 2014-03-09 NOTE — Telephone Encounter (Signed)
Ok to schedule him in 2- 30 minute appointment wherever you can find a place.

## 2014-03-09 NOTE — Progress Notes (Signed)
   Subjective:    Patient ID: [name], male    DOB: 1966/06/08, 48 y.o.   MRN: [id number]  HPI This 48 y.o. male presents for evaluation of numbness and tingling in his LE's and having Some joint pain.  He was seen last week by orthopedics and had an injection in his left ankle. 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  He has hx of uncontrolled diabetes.  He is having difficulty walking on his feet.  He has pain in his feet and ortho is rx'd his pain meds.  He is not on neurontin.   Review of Systems C/o pain in his feet, ankles, and knees.   No chest pain, SOB, HA, dizziness, vision change, N/V, diarrhea, constipation, dysuria, urinary urgency or frequency, myalgias, arthralgias or rash.     Objective:   Physical Exam  Vital signs noted  Well developed well nourished male.  HEENT - Head atraumatic Normocephalic                Eyes - PERRLA, Conjuctiva - clear Sclera- Clear EOMI                Ears - EAC's Wnl TM's Wnl Gross Hearing WNL                Throat - oropharanx wnl Respiratory - Lungs CTA bilateral Cardiac - RRR S1 and S2 without murmur GI - Abdomen soft Nontender and bowel sounds active x 4 Extremities - No edema. Neuro - Grossly intact. MS - Ankles with deformity and charcot joint bilateral  Feet - patient with flat feet, normal sensation in bottom of feet bilateral no ulcers      Assessment & Plan:  Unspecified hereditary and idiopathic peripheral neuropathy - Plan: gabapentin (NEURONTIN) 300 MG capsule  Diabetes - Plan: gabapentin (NEURONTIN) 300 MG capsule, POCT glycosylated hemoglobin (Hb A1C), POCT UA - Microalbumin  Urine frequency - Plan: PSA, total and free  Other malaise and fatigue - Plan: POCT glycosylated hemoglobin (Hb A1C), CBC with Differential, CMP14+EGFR, PSA, total and free, Lipid panel, Thyroid Panel With TSH  Routine general medical examination at a health care facility - Plan: CBC with Differential,  CMP14+EGFR, PSA, total and free, Lipid panel, Thyroid Panel With TSH  Charcot ankle- Follow up with Ortho  Follow up with Tammy Eckard next week and myself the week after.  Lysbeth Penner FNP

## 2014-03-09 NOTE — Telephone Encounter (Signed)
appt given for monday

## 2014-03-10 ENCOUNTER — Other Ambulatory Visit: Payer: Self-pay | Admitting: Family Medicine

## 2014-03-10 DIAGNOSIS — E119 Type 2 diabetes mellitus without complications: Secondary | ICD-10-CM

## 2014-03-10 LAB — CBC WITH DIFFERENTIAL/PLATELET
Basophils Absolute: 0 10*3/uL (ref 0.0–0.2)
Basos: 1 %
Eos: 0 %
Eosinophils Absolute: 0 10*3/uL (ref 0.0–0.4)
HCT: 52.9 % — ABNORMAL HIGH (ref 37.5–51.0)
Hemoglobin: 18.5 g/dL — ABNORMAL HIGH (ref 12.6–17.7)
Immature Grans (Abs): 0.1 10*3/uL (ref 0.0–0.1)
Immature Granulocytes: 2 %
Lymphocytes Absolute: 2 10*3/uL (ref 0.7–3.1)
Lymphs: 30 %
MCH: 31.3 pg (ref 26.6–33.0)
MCHC: 35 g/dL (ref 31.5–35.7)
MCV: 90 fL (ref 79–97)
Monocytes Absolute: 0.4 10*3/uL (ref 0.1–0.9)
Monocytes: 7 %
Neutrophils Absolute: 4.1 10*3/uL (ref 1.4–7.0)
Neutrophils Relative %: 61 %
RBC: 5.91 x10E6/uL — ABNORMAL HIGH (ref 4.14–5.80)
RDW: 13.2 % (ref 12.3–15.4)
WBC: 6.7 10*3/uL (ref 3.4–10.8)

## 2014-03-10 LAB — CMP14+EGFR
ALT: 26 IU/L (ref 0–44)
AST: 14 IU/L (ref 0–40)
Albumin/Globulin Ratio: 1.7 (ref 1.1–2.5)
Albumin: 4.5 g/dL (ref 3.5–5.5)
Alkaline Phosphatase: 154 IU/L — ABNORMAL HIGH (ref 39–117)
BUN/Creatinine Ratio: 14 (ref 9–20)
BUN: 14 mg/dL (ref 6–24)
CO2: 25 mmol/L (ref 18–29)
Calcium: 9.7 mg/dL (ref 8.7–10.2)
Chloride: 96 mmol/L — ABNORMAL LOW (ref 97–108)
Creatinine, Ser: 0.97 mg/dL (ref 0.76–1.27)
GFR calc Af Amer: 106 mL/min/{1.73_m2} (ref >59)
GFR calc non Af Amer: 91 mL/min/{1.73_m2} (ref >59)
Globulin, Total: 2.6 g/dL (ref 1.5–4.5)
Glucose: 334 mg/dL — ABNORMAL HIGH (ref 65–99)
Potassium: 5.2 mmol/L (ref 3.5–5.2)
Sodium: 137 mmol/L (ref 134–144)
Total Bilirubin: 0.7 mg/dL (ref 0.0–1.2)
Total Protein: 7.1 g/dL (ref 6.0–8.5)

## 2014-03-10 LAB — PSA, TOTAL AND FREE
PSA, Free Pct: 32.5 %
PSA, Free: 0.13 ng/mL
PSA: 0.4 ng/mL (ref 0.0–4.0)

## 2014-03-10 LAB — THYROID PANEL WITH TSH
Free Thyroxine Index: 2.2 (ref 1.2–4.9)
T3 Uptake Ratio: 29 % (ref 24–39)
T4, Total: 7.6 ug/dL (ref 4.5–12.0)
TSH: 1.49 u[IU]/mL (ref 0.450–4.500)

## 2014-03-10 LAB — LIPID PANEL
Chol/HDL Ratio: 3.6 ratio units (ref 0.0–5.0)
Cholesterol, Total: 130 mg/dL (ref 100–199)
HDL: 36 mg/dL — ABNORMAL LOW (ref >39)
LDL Calculated: 65 mg/dL (ref 0–99)
Triglycerides: 144 mg/dL (ref 0–149)
VLDL Cholesterol Cal: 29 mg/dL (ref 5–40)

## 2014-03-10 MED ORDER — GLIPIZIDE 5 MG PO TABS
5.0000 mg | ORAL_TABLET | Freq: Two times a day (BID) | ORAL | Status: DC
Start: 2014-03-10 — End: 2014-07-03

## 2014-03-16 ENCOUNTER — Telehealth: Payer: Self-pay | Admitting: Family Medicine

## 2014-03-16 ENCOUNTER — Ambulatory Visit (INDEPENDENT_AMBULATORY_CARE_PROVIDER_SITE_OTHER): Payer: 59 | Admitting: Family Medicine

## 2014-03-16 ENCOUNTER — Encounter: Payer: Self-pay | Admitting: Family Medicine

## 2014-03-16 VITALS — BP 137/84 | HR 77 | Temp 98.4°F | Ht 74.0 in | Wt 221.8 lb

## 2014-03-16 DIAGNOSIS — E119 Type 2 diabetes mellitus without complications: Secondary | ICD-10-CM

## 2014-03-16 DIAGNOSIS — G629 Polyneuropathy, unspecified: Secondary | ICD-10-CM

## 2014-03-16 DIAGNOSIS — M14679 Charcot's joint, unspecified ankle and foot: Secondary | ICD-10-CM

## 2014-03-16 DIAGNOSIS — A5211 Tabes dorsalis: Secondary | ICD-10-CM

## 2014-03-16 DIAGNOSIS — G589 Mononeuropathy, unspecified: Secondary | ICD-10-CM

## 2014-03-16 NOTE — Telephone Encounter (Signed)
Message copied by Azalee CourseFULP, ASHLEY on Mon Mar 16, 2014 10:25 AM ------      Message from: Deatra CanterXFORD, WILLIAM J      Created: Tue Mar 10, 2014  7:47 PM       hgbaic elevated and diabetes uncontrolled added glipizide 5mg  po bid and follow up in 3 months ------

## 2014-03-16 NOTE — Progress Notes (Signed)
   Subjective:    Patient ID: [name], male    DOB: 12/12/1965, 48 y.o.   MRN: [id number]  HPI  This 48 y.o. male presents for evaluation of peripheral neuropathy.  He has charcot joint.  He has diabetes that is uncontrolled.  He has been hurting in his lower extremities.  He has been out of work for a week.  He has been still been hurting in his legs.  He has been started on neurontin and has been started on glipizide 5mg  po bid.  He is out of work.  He has been having a lot of pain in his ankles and his feet and has had to take off time from work.  Review of Systems C/o bilateral foot pain and numbness and tingling No chest pain, SOB, HA, dizziness, vision change, N/V, diarrhea, constipation, dysuria, urinary urgency or frequency, myalgias, arthralgias or rash.     Objective:   Physical Exam Vital signs noted  Well developed well nourished male.  HEENT - Head atraumatic Normocephalic                Eyes - PERRLA, Conjuctiva - clear Sclera- Clear EOMI                Ears - EAC's Wnl TM's Wnl Gross Hearing WNL                Nose - Nares patent                 Throat - oropharanx wnl Respiratory - Lungs CTA bilateral Cardiac - RRR S1 and S2 without murmur GI - Abdomen soft Nontender and bowel sounds active x 4 Extremities - bilateral charcot joint and diminished sensation in bilateral feet. Neuro - Grossly intact.        Assessment & Plan:  Neuropathy - Increase the neurontin to 600mg  po tid  Charcot ankle - Increase neurontin and follow up next week.  Continue to be out of work and re-eval next week.  Diabetes - Continue with the new glipizide and follow up with tammy Eckerd pharm D.  Explained why diabetes control important.  Deatra CanterWilliam J Oxford FNP

## 2014-03-23 ENCOUNTER — Encounter: Payer: Self-pay | Admitting: Family Medicine

## 2014-03-23 ENCOUNTER — Ambulatory Visit (INDEPENDENT_AMBULATORY_CARE_PROVIDER_SITE_OTHER): Payer: 59 | Admitting: Family Medicine

## 2014-03-23 VITALS — BP 139/86 | HR 82 | Temp 98.1°F | Ht 74.0 in | Wt 224.0 lb

## 2014-03-23 DIAGNOSIS — G629 Polyneuropathy, unspecified: Secondary | ICD-10-CM

## 2014-03-23 DIAGNOSIS — E119 Type 2 diabetes mellitus without complications: Secondary | ICD-10-CM

## 2014-03-23 DIAGNOSIS — A5211 Tabes dorsalis: Secondary | ICD-10-CM

## 2014-03-23 DIAGNOSIS — M1460 Charcot's joint, unspecified site: Secondary | ICD-10-CM

## 2014-03-23 DIAGNOSIS — G609 Hereditary and idiopathic neuropathy, unspecified: Secondary | ICD-10-CM

## 2014-03-23 NOTE — Progress Notes (Signed)
   Subjective:    Patient ID: [name], male    DOB: January 10, 1966, 48 y.o.   MRN: [id number]  HPI This 48 y.o. male presents for evaluation of charcot ankles and peripheral neuropathy. He has been having severe pain in his lower extremities and has a job which requires him To be on his feet for extended time.  He has been taking neurontin and is now on 600mg  tid. He has been feeling better.  He is diabetic and has been taking glipized which has brought his fsbs down.  His recent hgbaic is 10.1. He is tolerating the neurontin and walking some better but still Has some discomfort in his feet and ankles. He is going to follow up with orthopedics this summer.   Review of Systems C/o pain in lower extremities, feet, and ankles. No chest pain, SOB, HA, dizziness, vision change, N/V, diarrhea, constipation, dysuria, urinary urgency or frequency or rash.     Objective:   Physical Exam Vital signs noted  Well developed well nourished male.  HEENT - Head atraumatic Normocephalic                Eyes - PERRLA, Conjuctiva - clear Sclera- Clear EOMI                Ears - EAC's Wnl TM's Wnl Gross Hearing WNL                Throat - oropharanx wnl Respiratory - Lungs CTA bilateral Cardiac - RRR S1 and S2 without murmur GI - Abdomen soft Nontender and bowel sounds active x 4 Extremities - No edema. Neuro - Grossly intact. Feet - Mild numbness with tactile stimuli bilateral feet MS - Charcot ankles      Assessment & Plan:  Peripheral neuropathy - Continue taking neurontin 600mg  po tid  Charcot's joint- Continue taking neurontin 600mg  tid and take off work for another week.  Diabetes - Continue taking glipizide and follow up with Tammy Eckerd for diabetes visit. Discussed the importance of diabetes control.  Follow up next week  Deatra CanterWilliam J Omarri Eich FNP

## 2014-03-24 ENCOUNTER — Telehealth: Payer: Self-pay | Admitting: Pharmacist

## 2014-03-24 NOTE — Telephone Encounter (Signed)
appt made

## 2014-03-25 NOTE — Telephone Encounter (Signed)
appt made for 03/26/14 at 10:30am.

## 2014-03-26 ENCOUNTER — Encounter: Payer: Self-pay | Admitting: Pharmacist

## 2014-03-26 ENCOUNTER — Ambulatory Visit (INDEPENDENT_AMBULATORY_CARE_PROVIDER_SITE_OTHER): Payer: 59 | Admitting: Pharmacist

## 2014-03-26 ENCOUNTER — Telehealth: Payer: Self-pay | Admitting: Family Medicine

## 2014-03-26 VITALS — BP 118/78 | HR 77 | Ht 74.0 in | Wt 226.0 lb

## 2014-03-26 DIAGNOSIS — E118 Type 2 diabetes mellitus with unspecified complications: Secondary | ICD-10-CM

## 2014-03-26 LAB — GLUCOSE, POCT (MANUAL RESULT ENTRY): POC GLUCOSE: 434 mg/dL — AB (ref 70–99)

## 2014-03-26 LAB — POCT URINALYSIS DIPSTICK
BILIRUBIN UA: NEGATIVE
Ketones, UA: NEGATIVE
Leukocytes, UA: NEGATIVE
NITRITE UA: NEGATIVE
Protein, UA: NEGATIVE
Spec Grav, UA: 1.01
Urobilinogen, UA: NEGATIVE
pH, UA: 5

## 2014-03-26 LAB — POCT UA - MICROALBUMIN: Microalbumin Ur, POC: NEGATIVE mg/L

## 2014-03-26 MED ORDER — ASPIRIN EC 81 MG PO TBEC: 81.0000 mg | DELAYED_RELEASE_TABLET | Freq: Every day | ORAL | Status: DC

## 2014-03-26 MED ORDER — GLUCOSE BLOOD VI STRP: ORAL_STRIP | Status: DC

## 2014-03-26 NOTE — Telephone Encounter (Signed)
Rx sent to [facility] pharmacy.  

## 2014-03-26 NOTE — Progress Notes (Signed)
Subjective:    [name] is a 48 y.o. male who presents for an initial evaluation of Type 2 diabetes [place].  He was initially diagnosed with type 2 DM about 2 years ago.  Current symptoms/problems include hyperglycemia, paresthesia of the feet and polyuria and have been worsening. Symptoms have been present for 2 months.  Known diabetic complications: nephropathy Cardiovascular risk factors: diabetes mellitus, hypertension, male gender and sedentary lifestyle Current diabetic medications include oral agents (dual therapy): glipizide (generic), Glucophage XR.   Eye exam current (within one year): unknown Weight trend: stable Prior visit with dietician: no Current diet: in general, an "unhealthy" diet - patient reports drinking sugary drinks regularly and having sweets / cookie daily. Current exercise: none  Current monitoring regimen: home blood tests - one times daily Home blood sugar records: patient self reports home BG readings 170-175 over the last 2-3 days.. Any episodes of hypoglycemia? no  Is He on ACE inhibitor or angiotensin II receptor blocker?  Yes  lisinopril (Prinivil)    The following portions of the patient's history were reviewed and updated as appropriate: allergies, current medications, past family history, past medical history, past social history, past surgical history and problem list.  Objective:    BP 118/78  Pulse 77  Ht 6\' 2"  (1.88 m)  Wt 226 lb (102.513 kg)  BMI 29.00 kg/m2  Lab Review Glucose (mg/dL)  Date Value  1/4/78295/02/2014 334*     Glucose, Bld (mg/dL)  Date Value  5/6/21302/12/2013 263*     CO2 (mmol/L)  Date Value  03/09/2014 25   12/08/2013 28      BUN (mg/dL)  Date Value  8/6/57845/02/2014 14   12/08/2013 15      Creatinine, Ser (mg/dL)  Date Value  6/9/62955/02/2014 0.97   12/08/2013 1.10        Assessment:    Diabetes Mellitus type II, under inadequate control.    Plan:    1.  Since patient just started glipizide 5mg  bid and BG is improving  will not change at this time. Also continue Metformin 1000mg  BID  Start ASA 81mg  1 tablet daily 2.  Education: Reviewed 'ABCs' of diabetes management (respective goals in parentheses):  A1C (<7), blood pressure (<140/80), and cholesterol (LDL <100). Reviewed BG targets. 3.  Compliance at present is estimated to be inadequate. Efforts to improve compliance (if necessary) will be directed at dietary modifications: limit refined sugar, discussed CHO serving sizes, increase whole grains, low fat protein sources, increased exercise and regular blood sugar monitoring: two times daily. 4. Follow up: 4 weeks   Henrene Pastorammy Sammi Stolarz, PharmD, CPP

## 2014-03-26 NOTE — Telephone Encounter (Signed)
complete

## 2014-03-26 NOTE — Telephone Encounter (Signed)
Left message on patient's phone about appointment for today. I asked him to call me back to confirm.

## 2014-03-27 ENCOUNTER — Telehealth: Payer: Self-pay | Admitting: Pharmacist

## 2014-03-27 NOTE — Telephone Encounter (Signed)
Called patient. BG much better than yesterday.  Continue to check BG up to bid. Call office if BG over 200.

## 2014-03-31 ENCOUNTER — Encounter: Payer: Self-pay | Admitting: Family Medicine

## 2014-03-31 ENCOUNTER — Ambulatory Visit (INDEPENDENT_AMBULATORY_CARE_PROVIDER_SITE_OTHER): Payer: 59 | Admitting: Family Medicine

## 2014-03-31 VITALS — BP 123/77 | HR 86 | Temp 98.3°F | Ht 74.0 in | Wt 224.4 lb

## 2014-03-31 DIAGNOSIS — A5211 Tabes dorsalis: Secondary | ICD-10-CM

## 2014-03-31 DIAGNOSIS — M14679 Charcot's joint, unspecified ankle and foot: Secondary | ICD-10-CM

## 2014-03-31 DIAGNOSIS — E118 Type 2 diabetes mellitus with unspecified complications: Secondary | ICD-10-CM

## 2014-03-31 DIAGNOSIS — E1149 Type 2 diabetes mellitus with other diabetic neurological complication: Secondary | ICD-10-CM

## 2014-03-31 DIAGNOSIS — E1142 Type 2 diabetes mellitus with diabetic polyneuropathy: Secondary | ICD-10-CM

## 2014-03-31 DIAGNOSIS — IMO0002 Reserved for concepts with insufficient information to code with codable children: Secondary | ICD-10-CM

## 2014-03-31 DIAGNOSIS — E1165 Type 2 diabetes mellitus with hyperglycemia: Secondary | ICD-10-CM

## 2014-03-31 DIAGNOSIS — E1140 Type 2 diabetes mellitus with diabetic neuropathy, unspecified: Secondary | ICD-10-CM

## 2014-03-31 NOTE — Progress Notes (Signed)
   Subjective:    Patient ID: SWAYAM CORPORON, male    DOB: 02/22/1966, 48 y.o.   MRN: [id number]  HPI This 48 y.o. male presents for evaluation of pain in his bilateral ankles.  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  He has been out of work and feels like he needs another week before he will be ready for 12 hours shifts on his feet.  He sees a podiatrist in  [place] and he has custom inserts.  He has been seeing Tammy Eckerd clinical pharmacist and she is helping him with his diabetes. He has been taking the neurontin and is tolerating it fine.  He has been feeling a lot better.   Review of Systems C/o bilateral ankle pain No chest pain, SOB, HA, dizziness, vision change, N/V, diarrhea, constipation, dysuria, urinary urgency or frequency, myalgias, arthralgias or rash.     Objective:   Physical Exam Vital signs noted  Well developed well nourished male.  HEENT - Head atraumatic Normocephalic                Eyes - PERRLA, Conjuctiva - clear Sclera- Clear EOMI                Ears - EAC's Wnl TM's Wnl Gross Hearing WNL                Nose - Nares patent                 Throat - oropharanx wnl Respiratory - Lungs CTA bilateral Cardiac - RRR S1 and S2 without murmur GI - Abdomen soft Nontender and bowel sounds active x 4 Extremities - No edema. Neuro - Grossly intact. MS - Bilateral charcot ankle joints      Assessment & Plan:  Charcot ankle - Follow up with podiatry and consider referral to ortho if not better  Type II or unspecified type diabetes mellitus with unspecified complication, uncontrolled Follow up with Tammy Eckerd Pharm D.  Neuropathy in diabetes - Continue gabapentin and out of work for an additional week and follow up next week  [name] FNP

## 2014-04-01 ENCOUNTER — Telehealth: Payer: Self-pay | Admitting: Family Medicine

## 2014-04-01 NOTE — Telephone Encounter (Signed)
error 

## 2014-04-06 ENCOUNTER — Ambulatory Visit (INDEPENDENT_AMBULATORY_CARE_PROVIDER_SITE_OTHER): Payer: 59 | Admitting: Family Medicine

## 2014-04-06 VITALS — BP 139/78 | HR 87 | Temp 98.8°F | Ht 74.0 in | Wt 227.0 lb

## 2014-04-06 DIAGNOSIS — A5211 Tabes dorsalis: Secondary | ICD-10-CM

## 2014-04-06 DIAGNOSIS — G609 Hereditary and idiopathic neuropathy, unspecified: Secondary | ICD-10-CM

## 2014-04-06 DIAGNOSIS — M1460 Charcot's joint, unspecified site: Secondary | ICD-10-CM

## 2014-04-06 DIAGNOSIS — E119 Type 2 diabetes mellitus without complications: Secondary | ICD-10-CM

## 2014-04-06 MED ORDER — GABAPENTIN 300 MG PO CAPS: 300.0000 mg | ORAL_CAPSULE | Freq: Two times a day (BID) | ORAL | Status: DC

## 2014-04-06 NOTE — Progress Notes (Signed)
   Subjective:    Patient ID: [name], male    DOB: [birth year], 48 y.o.   MRN: [id number]  HPI This 48 y.o. male presents for evaluation of diabetes and bilateral charcot joint in the ankles. He has been doing better with the pain in his ankles due to charcot joint bilateral.  He is seeing ortho And is going to get surgery in the winter time.  He has doing better with his blood sugars and states last night blood sugar was 110.   Review of Systems C/o numbness and discomfort in bilateral feet.   No chest pain, SOB, HA, dizziness, vision change, N/V, diarrhea, constipation, dysuria, urinary urgency or frequency, myalgias, arthralgias or rash.  Objective:   Physical Exam Vital signs noted  Well developed well nourished male.  HEENT - Head atraumatic Normocephalic Respiratory - Lungs CTA bilateral Cardiac - RRR S1 and S2 without murmur GI - Abdomen soft Nontender and bowel sounds active x 4 Extremities - No edema. MS - Bilateral ankles with charcot joint and diminished sensation bilateral feet with monofilament      Assessment & Plan:  Unspecified hereditary and idiopathic peripheral neuropathy - Plan: gabapentin (NEURONTIN) 300 MG capsule  Diabetes - Plan: gabapentin (NEURONTIN) 300 MG capsule  Charcot's joint - Plan: gabapentin (NEURONTIN) 300 MG capsule  Out of work for another week and return 04/13/14 and light duty until 05/03/14  [name] FNP

## 2014-04-09 ENCOUNTER — Telehealth: Payer: Self-pay | Admitting: Family Medicine

## 2014-05-11 ENCOUNTER — Ambulatory Visit: Payer: 59

## 2014-05-26 ENCOUNTER — Other Ambulatory Visit: Payer: Self-pay | Admitting: Family Medicine

## 2014-05-27 NOTE — Telephone Encounter (Signed)
Not on med list

## 2014-06-12 ENCOUNTER — Encounter (HOSPITAL_COMMUNITY): Payer: Self-pay

## 2014-06-12 ENCOUNTER — Inpatient Hospital Stay (HOSPITAL_COMMUNITY)
Admission: AD | Admit: 2014-06-12 | Discharge: 2014-06-18 | DRG: 617 | Disposition: A | Discharge status: Home Health | Payer: 59 | Source: Ambulatory Visit | Attending: Internal Medicine | Admitting: Internal Medicine

## 2014-06-12 ENCOUNTER — Ambulatory Visit (INDEPENDENT_AMBULATORY_CARE_PROVIDER_SITE_OTHER): Payer: 59 | Admitting: Family Medicine

## 2014-06-12 ENCOUNTER — Encounter: Payer: Self-pay | Admitting: Family Medicine

## 2014-06-12 ENCOUNTER — Ambulatory Visit (INDEPENDENT_AMBULATORY_CARE_PROVIDER_SITE_OTHER): Payer: 59

## 2014-06-12 VITALS — BP 150/83 | HR 89 | Temp 98.2°F | Ht 74.0 in | Wt 224.0 lb

## 2014-06-12 DIAGNOSIS — Y33XXXS Other specified events, undetermined intent, sequela: Secondary | ICD-10-CM

## 2014-06-12 DIAGNOSIS — S91301S Unspecified open wound, right foot, sequela: Secondary | ICD-10-CM

## 2014-06-12 DIAGNOSIS — Z823 Family history of stroke: Secondary | ICD-10-CM

## 2014-06-12 DIAGNOSIS — E118 Type 2 diabetes mellitus with unspecified complications: Secondary | ICD-10-CM

## 2014-06-12 DIAGNOSIS — E1142 Type 2 diabetes mellitus with diabetic polyneuropathy: Secondary | ICD-10-CM | POA: Diagnosis present

## 2014-06-12 DIAGNOSIS — Z833 Family history of diabetes mellitus: Secondary | ICD-10-CM

## 2014-06-12 DIAGNOSIS — L97509 Non-pressure chronic ulcer of other part of unspecified foot with unspecified severity: Secondary | ICD-10-CM | POA: Diagnosis present

## 2014-06-12 DIAGNOSIS — E1149 Type 2 diabetes mellitus with other diabetic neurological complication: Secondary | ICD-10-CM | POA: Diagnosis present

## 2014-06-12 DIAGNOSIS — E1165 Type 2 diabetes mellitus with hyperglycemia: Secondary | ICD-10-CM

## 2014-06-12 DIAGNOSIS — IMO0002 Reserved for concepts with insufficient information to code with codable children: Principal | ICD-10-CM | POA: Diagnosis present

## 2014-06-12 DIAGNOSIS — Z79899 Other long term (current) drug therapy: Secondary | ICD-10-CM

## 2014-06-12 DIAGNOSIS — E1169 Type 2 diabetes mellitus with other specified complication: Principal | ICD-10-CM

## 2014-06-12 DIAGNOSIS — Z7982 Long term (current) use of aspirin: Secondary | ICD-10-CM

## 2014-06-12 DIAGNOSIS — L03039 Cellulitis of unspecified toe: Secondary | ICD-10-CM | POA: Diagnosis present

## 2014-06-12 DIAGNOSIS — A4901 Methicillin susceptible Staphylococcus aureus infection, unspecified site: Secondary | ICD-10-CM | POA: Diagnosis present

## 2014-06-12 DIAGNOSIS — I152 Hypertension secondary to endocrine disorders: Secondary | ICD-10-CM | POA: Diagnosis present

## 2014-06-12 DIAGNOSIS — M869 Osteomyelitis, unspecified: Secondary | ICD-10-CM | POA: Diagnosis present

## 2014-06-12 DIAGNOSIS — M9080 Osteopathy in diseases classified elsewhere, unspecified site: Secondary | ICD-10-CM | POA: Diagnosis present

## 2014-06-12 DIAGNOSIS — M14679 Charcot's joint, unspecified ankle and foot: Secondary | ICD-10-CM

## 2014-06-12 DIAGNOSIS — I10 Essential (primary) hypertension: Secondary | ICD-10-CM | POA: Diagnosis present

## 2014-06-12 DIAGNOSIS — E119 Type 2 diabetes mellitus without complications: Secondary | ICD-10-CM | POA: Diagnosis present

## 2014-06-12 DIAGNOSIS — L02619 Cutaneous abscess of unspecified foot: Secondary | ICD-10-CM | POA: Diagnosis present

## 2014-06-12 DIAGNOSIS — T148XXA Other injury of unspecified body region, initial encounter: Secondary | ICD-10-CM | POA: Diagnosis present

## 2014-06-12 DIAGNOSIS — M5136 Other intervertebral disc degeneration, lumbar region: Secondary | ICD-10-CM

## 2014-06-12 DIAGNOSIS — E1140 Type 2 diabetes mellitus with diabetic neuropathy, unspecified: Secondary | ICD-10-CM | POA: Diagnosis present

## 2014-06-12 LAB — COMPREHENSIVE METABOLIC PANEL
ALK PHOS: 173 U/L — AB (ref 39–117)
ALT: 18 U/L (ref 0–53)
AST: 13 U/L (ref 0–37)
Albumin: 3.7 g/dL (ref 3.5–5.2)
Anion gap: 9 (ref 5–15)
BUN: 16 mg/dL (ref 6–23)
CALCIUM: 9.5 mg/dL (ref 8.4–10.5)
CO2: 30 mEq/L (ref 19–32)
Chloride: 99 mEq/L (ref 96–112)
Creatinine, Ser: 0.88 mg/dL (ref 0.50–1.35)
GFR calc non Af Amer: >90 mL/min (ref >90)
Glucose, Bld: 220 mg/dL — ABNORMAL HIGH (ref 70–99)
POTASSIUM: 4.3 meq/L (ref 3.7–5.3)
SODIUM: 138 meq/L (ref 137–147)
TOTAL PROTEIN: 7.4 g/dL (ref 6.0–8.3)
Total Bilirubin: 0.9 mg/dL (ref 0.3–1.2)

## 2014-06-12 LAB — GLUCOSE, CAPILLARY
GLUCOSE-CAPILLARY: 194 mg/dL — AB (ref 70–99)
Glucose-Capillary: 160 mg/dL — ABNORMAL HIGH (ref 70–99)

## 2014-06-12 LAB — CBC WITH DIFFERENTIAL/PLATELET
BASOS PCT: 1 % (ref 0–1)
Basophils Absolute: 0 10*3/uL (ref 0.0–0.1)
EOS ABS: 0.1 10*3/uL (ref 0.0–0.7)
EOS PCT: 1 % (ref 0–5)
HEMATOCRIT: 45.2 % (ref 39.0–52.0)
HEMOGLOBIN: 16.2 g/dL (ref 13.0–17.0)
LYMPHS ABS: 2 10*3/uL (ref 0.7–4.0)
Lymphocytes Relative: 32 % (ref 12–46)
MCH: 32 pg (ref 26.0–34.0)
MCHC: 35.8 g/dL (ref 30.0–36.0)
MCV: 89.3 fL (ref 78.0–100.0)
MONO ABS: 0.7 10*3/uL (ref 0.1–1.0)
Monocytes Relative: 10 % (ref 3–12)
Neutro Abs: 3.6 10*3/uL (ref 1.7–7.7)
Neutrophils Relative %: 56 % (ref 43–77)
Platelets: 179 10*3/uL (ref 150–400)
RBC: 5.06 MIL/uL (ref 4.22–5.81)
RDW: 12.1 % (ref 11.5–15.5)
WBC: 6.3 10*3/uL (ref 4.0–10.5)

## 2014-06-12 MED ORDER — VANCOMYCIN HCL 10 G IV SOLR
1500.0000 mg | Freq: Once | INTRAVENOUS | Status: AC
  Administered 2014-06-12: 1500 mg via INTRAVENOUS
  Filled 2014-06-12: qty 1500

## 2014-06-12 MED ORDER — MORPHINE SULFATE 2 MG/ML IJ SOLN: 1.0000 mg | INTRAMUSCULAR | Status: DC | PRN

## 2014-06-12 MED ORDER — DOXYCYCLINE HYCLATE 100 MG PO TABS: 100.0000 mg | ORAL_TABLET | Freq: Two times a day (BID) | ORAL | Status: DC

## 2014-06-12 MED ORDER — SODIUM CHLORIDE 0.9 % IV SOLN: 1.5000 g | Freq: Four times a day (QID) | INTRAVENOUS | Status: DC

## 2014-06-12 MED ORDER — INSULIN ASPART 100 UNIT/ML ~~LOC~~ SOLN
0.0000 [IU] | Freq: Every day | SUBCUTANEOUS | Status: DC
  Administered 2014-06-13: 3 [IU] via SUBCUTANEOUS

## 2014-06-12 MED ORDER — ENOXAPARIN SODIUM 40 MG/0.4ML ~~LOC~~ SOLN
40.0000 mg | SUBCUTANEOUS | Status: DC
  Administered 2014-06-12: 40 mg via SUBCUTANEOUS
  Filled 2014-06-12: qty 0.4

## 2014-06-12 MED ORDER — VANCOMYCIN HCL IN DEXTROSE 1-5 GM/200ML-% IV SOLN
1000.0000 mg | Freq: Three times a day (TID) | INTRAVENOUS | Status: DC
  Administered 2014-06-13 – 2014-06-14 (×4): 1000 mg via INTRAVENOUS
  Filled 2014-06-12 (×8): qty 200

## 2014-06-12 MED ORDER — GABAPENTIN 300 MG PO CAPS
300.0000 mg | ORAL_CAPSULE | Freq: Two times a day (BID) | ORAL | Status: DC
  Administered 2014-06-12 – 2014-06-18 (×12): 300 mg via ORAL
  Filled 2014-06-12 (×12): qty 1

## 2014-06-12 MED ORDER — ACETAMINOPHEN 650 MG RE SUPP: 650.0000 mg | Freq: Four times a day (QID) | RECTAL | Status: DC | PRN

## 2014-06-12 MED ORDER — ONDANSETRON HCL 4 MG PO TABS: 4.0000 mg | ORAL_TABLET | Freq: Four times a day (QID) | ORAL | Status: DC | PRN

## 2014-06-12 MED ORDER — HYDROCODONE-ACETAMINOPHEN 10-325 MG PO TABS: 1.0000 | ORAL_TABLET | Freq: Four times a day (QID) | ORAL | Status: DC | PRN

## 2014-06-12 MED ORDER — LEVOFLOXACIN 750 MG PO TABS: 750.0000 mg | ORAL_TABLET | Freq: Every day | ORAL | Status: DC

## 2014-06-12 MED ORDER — LISINOPRIL 10 MG PO TABS
20.0000 mg | ORAL_TABLET | Freq: Every day | ORAL | Status: DC
  Administered 2014-06-12 – 2014-06-18 (×7): 20 mg via ORAL
  Filled 2014-06-12 (×7): qty 2

## 2014-06-12 MED ORDER — SENNA 8.6 MG PO TABS
1.0000 | ORAL_TABLET | Freq: Two times a day (BID) | ORAL | Status: DC
  Administered 2014-06-12 – 2014-06-18 (×11): 8.6 mg via ORAL
  Filled 2014-06-12 (×12): qty 1

## 2014-06-12 MED ORDER — ACETAMINOPHEN 325 MG PO TABS: 650.0000 mg | ORAL_TABLET | Freq: Four times a day (QID) | ORAL | Status: DC | PRN

## 2014-06-12 MED ORDER — ALUM & MAG HYDROXIDE-SIMETH 200-200-20 MG/5ML PO SUSP: 30.0000 mL | Freq: Four times a day (QID) | ORAL | Status: DC | PRN

## 2014-06-12 MED ORDER — HYDROCODONE-ACETAMINOPHEN 10-325 MG PO TABS
1.0000 | ORAL_TABLET | Freq: Four times a day (QID) | ORAL | Status: DC | PRN
  Administered 2014-06-16 – 2014-06-17 (×2): 1 via ORAL
  Filled 2014-06-12 (×2): qty 1

## 2014-06-12 MED ORDER — TRAZODONE HCL 50 MG PO TABS: 50.0000 mg | ORAL_TABLET | Freq: Every evening | ORAL | Status: DC | PRN

## 2014-06-12 MED ORDER — ONDANSETRON HCL 4 MG/2ML IJ SOLN: 4.0000 mg | Freq: Four times a day (QID) | INTRAMUSCULAR | Status: DC | PRN

## 2014-06-12 MED ORDER — HEPARIN SODIUM (PORCINE) 5000 UNIT/ML IJ SOLN: 5000.0000 [IU] | Freq: Three times a day (TID) | INTRAMUSCULAR | Status: DC

## 2014-06-12 MED ORDER — INSULIN ASPART 100 UNIT/ML ~~LOC~~ SOLN
0.0000 [IU] | Freq: Three times a day (TID) | SUBCUTANEOUS | Status: DC
  Administered 2014-06-12 – 2014-06-14 (×5): 3 [IU] via SUBCUTANEOUS
  Administered 2014-06-14: 8 [IU] via SUBCUTANEOUS
  Administered 2014-06-14: 3 [IU] via SUBCUTANEOUS

## 2014-06-12 NOTE — H&P (Signed)
Patient seen, independently examined and chart reviewed. I agree with exam, assessment and plan discussed with Toya SmothersKaren Black, NP.  Very pleasant 48 year old man with history of diabetes mellitus with Charcot deformities bilateral ankles/feet who developed wound on the right third toe several weeks ago, scratched and shower and had worsening over the course of several days. He developed redness and pain and was seen by his primary care provider stay in the office. Imaging suggested osteomyelitis and he was referred for direct admission.  PMH Diabetes mellitus with neuropathy, charcot joints Hypertension  Subjective: Currently he feels well. Minimal pain in the third right toe.  Objective:  Gen.  Appears calm and comfortable.  Psych. Alert. Speech fluent and clear.  Eyes. Appear grossly normal  ENT. Lips appear grossly normal  Cardiovascular. Regular rate and rhythm. No murmur, rub or gallop. No lower extremity edema.  Respiratory. Clear to auscultation bilaterally, no w/w/r. Normal respiratory effort.  Skin. Right 3rd toe with erythema and some edema. Non-tender. Ulcer bottom aspect of toe with no significant drainage.  Musculoskeletal. Charcot deformities both feet  Neurologic. Grossly unremarkable   Chemistry: Alkaline phosphatase somewhat elevated otherwise CMP unremarkable.  Heme: CBC normal  ID: Blood cultures pending  Imaging: Foot x-ray revealed erosive changes in the third distal phalanx right foot consistent with osteomyelitis.  Osteomyelitis right third toe. No systemic signs or symptoms. Cultures pending (blood here, wound obtained at office). No significant drainage at this point. Orthopedics has recommended antibiotics for now. No surgery anticipated currently.  If improves rapidly with antibiotics with consider discharge on oral antibiotics and followup with wound care in [place].  Brendia Sacksaniel Zian Delair, MD [facility] [phone]

## 2014-06-12 NOTE — Consult Note (Signed)
History and Physical  Anthony RavensKeith B Propp [id number] DOB: October 28, 1966 DOA: 06/12/2014  Referring physician:  PCP: Rudi HeapMOORE, DONALD, MD   Chief Complaint: foot wound/osteo  HPI: [name] is a very pleasant 48 y.o. male with past medical hx that includes DM, HTN, bilateral charcot joint ankles, neuropathy presents to room 327 with cc persistent foot wound. He reports had callus type wound on pad of 3rd toe on right foot. Yesterday noticed drainage. Last night at work pain intensified to point he had to leave work. He attributes this to the steel toe shoes as pain is much better not wearing those shoes. He chills, fever headache, nausea/vomiting/diarrhea. He went to PCP who obtained xray of foot that is concerning for osteomyelitis. He was referred for direct admit.   He reports antibiotics were "called in" today but he has not taken any. On admission VSS afebrile and not toxic appearing.  Review of Systems:  10 point review of systems completed and all systems are negative except as indicated in the history of present illness     Past Medical History   Diagnosis  Date   .  Boil         numerous boils in past   .  Diabetes mellitus     .  Hypertension     .  Neuropathy associated with endocrine disorder        Past Surgical History   Procedure  Laterality  Date   .  Hernia repair        Social History:  reports that he has never smoked. He does not have any smokeless tobacco history on file. He reports that he does not drink alcohol or use illicit drugs. He lives at home he works full-time at [employer]. he is independent with ADLs Allergies   Allergen  Reactions   .  Penicillins         Family History   Problem  Relation  Age of Onset   .  Stroke  Father     .  Diabetes  Brother     .  Stroke  Brother     .  Diabetes  Mother          Prior to Admission medications    Medication  Sig  Start Date  End Date  Taking?  Authorizing Provider   aspirin EC 81 MG tablet   Take 1 tablet (81 mg total) by mouth daily.  03/26/14      Tammy Eckard, PHARMD   doxycycline (VIBRA-TABS) 100 MG tablet  Take 1 tablet (100 mg total) by mouth 2 (two) times daily.  06/12/14      Deatra CanterWilliam J Oxford, FNP   gabapentin (NEURONTIN) 300 MG capsule  Take 1 capsule (300 mg total) by mouth 2 (two) times daily.  04/06/14      Deatra CanterWilliam J Oxford, FNP   glipiZIDE (GLUCOTROL) 5 MG tablet  Take 1 tablet (5 mg total) by mouth 2 (two) times daily before a meal.  03/10/14      Deatra CanterWilliam J Oxford, FNP   glucose blood (ACCU-CHEK AVIVA PLUS) test strip  Use to check BG up to twice daily.  Dx:  250.02 (uncontrolled type 2 DM)  03/26/14      Tammy Eckard, PHARMD   HYDROcodone-acetaminophen (NORCO) 10-325 MG per tablet  Take 1 tablet by mouth every 6 (six) hours as needed.  06/12/14      Deatra CanterWilliam J Oxford, FNP   ibuprofen (ADVIL,MOTRIN) 800  MG tablet  Take 1 tablet (800 mg total) by mouth every 8 (eight) hours as needed for pain.  09/03/13      Mary-Margaret Daphine Deutscher, FNP   levofloxacin (LEVAQUIN) 750 MG tablet  Take 1 tablet (750 mg total) by mouth daily.  06/12/14      [name], FNP   lisinopril (PRINIVIL,ZESTRIL) 20 MG tablet  Take 1 tablet (20 mg total) by mouth daily.  09/22/13      Mary-Margaret Daphine Deutscher, FNP   meloxicam (MOBIC) 15 MG tablet  1 TABLET ONCE A DAY ORALLY 30 DAY(S)        [name], FNP   metFORMIN (GLUMETZA) 1000 MG (MOD) 24 hr tablet  Take 1 tablet (1,000 mg total) by mouth 2 (two) times daily with a meal.  09/22/13      Mary-Margaret Daphine Deutscher, FNP    Physical Exam: Filed Vitals:     06/12/14 1239  06/12/14 1250   BP:  157/94  154/97   Pulse:  83  85   Temp:  98.4 F (36.9 C)  98.6 F (37 C)   TempSrc:  Oral  Oral   Resp:  18     Height:    6\' 4"  (1.93 m)   Weight:    102.059 kg (225 lb)   SpO2:  98%  98%       Wt Readings from Last 3 Encounters:   06/12/14  102.059 kg (225 lb)   06/12/14  101.606 kg (224 lb)   04/06/14  102.967 kg (227 lb)     Vital signs are stable as  recorded  General appearance is normal, body habitus NORMAL   The patient is alert and oriented x 3  The patient's mood and affect are normal  Gait assessment: NORMAL   The cardiovascular exam reveals normal pulses and temperature without edema or  swelling.  The lymphatic system is negative for palpable lymph nodes  The sensory exam is normal.  There are no pathologic reflexes.  Balance is normal.   Exam of the LEFT THIRD DIGIT OF THE FOOT   Inspection PLANTAR ULCER WITH SURROUNDING CALLOUS  REDNESS TO THE mcp JOINT  Range of motion UNAFFECTED  Stability UNAFFECTED  Strength grade 5 motor strength  Skin ABnormal, ERYTHEMA   A/P  IMPRESSION: Erosive changes in the third distal phalanx consistent with osteomyelitis.                  Labs on Admission:  Basic Metabolic Panel: No results found for this basename: NA, K, CL, CO2, GLUCOSE, BUN, CREATININE, CALCIUM, MG, PHOS,  in the last 168 hours Liver Function Tests: No results found for this basename: AST, ALT, ALKPHOS, BILITOT, PROT, ALBUMIN,  in the last 168 hours No results found for this basename: LIPASE, AMYLASE,  in the last 168 hours No results found for this basename: AMMONIA,  in the last 168 hours CBC: No results found for this basename: WBC, NEUTROABS, HGB, HCT, MCV, PLT,  in the last 168 hours Cardiac Enzymes: No results found for this basename: CKTOTAL, CKMB, CKMBINDEX, TROPONINI,  in the last 168 hours  BNP (last 3 results) No results found for this basename: PROBNP,  in the last 8760 hours CBG: No results found for this basename: GLUCAP,  in the last 168 hours  Radiological Exams on Admission: Dg Foot Complete Right  06/12/2014   CLINICAL DATA:  Third toe infection  EXAM: RIGHT FOOT COMPLETE - 3+ VIEW  COMPARISON:  None.  FINDINGS: No acute fracture or dislocation is noted. Erosive changes are noted in the third distal phalanx consistent with osteomyelitis. Degenerative changes of the first  tarsal metatarsal articulation are seen  IMPRESSION: Erosive changes in the third distal phalanx consistent with osteomyelitis.   Electronically Signed   By: Alcide Clever M.D.   On: 06/12/2014 10:28     EKG:   Assessment/Plan Principal Problem:   Osteomyelitis: Admit to medical floor. X-ray consistent with osteomyelitis. Will obtain CBC with diff, CMET, blood cultures. Have requested orthopedic consult. No s/sx systemic infection. Will defer antibiotics until evaluated by ortho in hopes of obtaining good wound culture. If patient develops s/sx systemic infection will initiate antibiotics.    Active Problems:   Essential hypertension, benign: home meds include lisinopril will continue    Type II or unspecified type diabetes mellitus with unspecified complication, will obtain A1c and use SSI for optimal control. Will hold oral agents for now.     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 Planning surgery in winter months    Code Status: full DVT Prophylaxis: Family Communication: none present Disposition Plan: home when ready  Agree with above plans XC START ANTIBIOTICS

## 2014-06-12 NOTE — Progress Notes (Signed)
ANTIBIOTIC CONSULT NOTE - INITIAL  Pharmacy Consult for Vancomycin Indication: Osteomyelitis  Allergies  Allergen Reactions  . Penicillins     Patient Measurements: Height: 6\' 4"  (193 cm) Weight: 225 lb (102.059 kg) IBW/kg (Calculated) : 86.8 Adjusted Body Weight:   Vital Signs: Temp: 98.6 F (37 C) (08/07 1250) Temp src: Oral (08/07 1250) BP: 152/87 mmHg (08/07 1516) Pulse Rate: 85 (08/07 1250) Intake/Output from previous day:   Intake/Output from this shift:    Labs:  Recent Labs  06/12/14 1346  WBC 6.3  HGB 16.2  PLT 179  CREATININE 0.88   Estimated Creatinine Clearance: 124.7 ml/min (by C-G formula based on Cr of 0.88). No results found for this basename: VANCOTROUGH, VANCOPEAK, VANCORANDOM, GENTTROUGH, GENTPEAK, GENTRANDOM, TOBRATROUGH, TOBRAPEAK, TOBRARND, AMIKACINPEAK, AMIKACINTROU, AMIKACIN,  in the last 72 hours   Microbiology: No results found for this or any previous visit (from the past 720 hour(s)).  Medical History: Past Medical History  Diagnosis Date  . Boil     numerous boils in past  . Diabetes mellitus   . Hypertension   . Neuropathy associated with endocrine disorder     Medications:  Scheduled:  . gabapentin  300 mg Oral BID  . heparin subcutaneous  5,000 Units Subcutaneous 3 times per day  . insulin aspart  0-15 Units Subcutaneous TID WC  . insulin aspart  0-5 Units Subcutaneous QHS  . lisinopril  20 mg Oral Daily  . senna  1 tablet Oral BID  . vancomycin  1,500 mg Intravenous Once  . [START ON 06/13/2014] vancomycin  1,000 mg Intravenous Q8H   Assessment: Callus type wound pad 3rd toe on right foot XRAY concerning for osteomyelitis  Goal of Therapy:  Vancomycin trough level 15-20 mcg/ml  Plan:  Vancomycin 1500 mg IV loading dose, then Vancomycin 1 GM IV every 8 hours Vancomycin trough at steady state Monitor renal function Labs per protocol  [name], Soo Steelman Bennett 06/12/2014,4:00 PM

## 2014-06-12 NOTE — H&P (Signed)
[facility] History and Physical  Anthony RavensKeith B Jeremiah [id number] DOB: [birth year] DOA: 06/12/2014  Referring physician:  PCP: Rudi HeapMOORE, DONALD, MD   Chief Complaint: foot wound/osteo  HPI: [name] is a very pleasant 48 y.o. male with past medical hx that includes DM, HTN, bilateral charcot joint ankles, neuropathy presents to room 327 with cc persistent foot wound. He reports had callus type wound on pad of 3rd toe on right foot. Yesterday noticed drainage. Last night at work pain intensified to point he had to leave work. He attributes this to the steel toe shoes as pain is much better not wearing those shoes. He chills, fever headache, nausea/vomiting/diarrhea. He went to PCP who obtained xray of foot that is concerning for osteomyelitis. He was referred for direct admit.   He reports antibiotics were "called in" today but he has not taken any. On admission VSS afebrile and not toxic appearing.  Review of Systems:  10 point review of systems completed and all systems are negative except as indicated in the history of present illness   Past Medical History  Diagnosis Date  . Boil     numerous boils in past  . Diabetes mellitus   . Hypertension   . Neuropathy associated with endocrine disorder    Past Surgical History  Procedure Laterality Date  . Hernia repair     Social History:  reports that he has never smoked. He does not have any smokeless tobacco history on file. He reports that he does not drink alcohol or use illicit drugs. He lives at home he works full-time at [employer]. he is independent with ADLs Allergies  Allergen Reactions  . Penicillins     Family History  Problem Relation Age of Onset  . Stroke Father   . Diabetes Brother   . Stroke Brother   . Diabetes Mother      Prior to Admission medications   Medication Sig Start Date End Date Taking? Authorizing Provider  aspirin EC 81 MG tablet Take 1 tablet (81 mg total) by mouth daily.  03/26/14   Tammy Eckard, PHARMD  doxycycline (VIBRA-TABS) 100 MG tablet Take 1 tablet (100 mg total) by mouth 2 (two) times daily. 06/12/14   Deatra CanterWilliam J Oxford, FNP  gabapentin (NEURONTIN) 300 MG capsule Take 1 capsule (300 mg total) by mouth 2 (two) times daily. 04/06/14   Deatra CanterWilliam J Oxford, FNP  glipiZIDE (GLUCOTROL) 5 MG tablet Take 1 tablet (5 mg total) by mouth 2 (two) times daily before a meal. 03/10/14   Deatra CanterWilliam J Oxford, FNP  glucose blood (ACCU-CHEK AVIVA PLUS) test strip Use to check BG up to twice daily.  Dx:  250.02 (uncontrolled type 2 DM) 03/26/14   Tammy Eckard, PHARMD  HYDROcodone-acetaminophen (NORCO) 10-325 MG per tablet Take 1 tablet by mouth every 6 (six) hours as needed. 06/12/14   Deatra CanterWilliam J Oxford, FNP  ibuprofen (ADVIL,MOTRIN) 800 MG tablet Take 1 tablet (800 mg total) by mouth every 8 (eight) hours as needed for pain. 09/03/13   Mary-Margaret Daphine DeutscherMartin, FNP  levofloxacin (LEVAQUIN) 750 MG tablet Take 1 tablet (750 mg total) by mouth daily. 06/12/14   Deatra CanterWilliam J Oxford, FNP  lisinopril (PRINIVIL,ZESTRIL) 20 MG tablet Take 1 tablet (20 mg total) by mouth daily. 09/22/13   Mary-Margaret Daphine DeutscherMartin, FNP  meloxicam (MOBIC) 15 MG tablet 1 TABLET ONCE A DAY ORALLY 30 DAY(S)    Deatra CanterWilliam J Oxford, FNP  metFORMIN (GLUMETZA) 1000 MG (MOD) 24 hr tablet Take 1 tablet (1,000  mg total) by mouth 2 (two) times daily with a meal. 09/22/13   Mary-Margaret Daphine Deutscher, FNP   Physical Exam: Filed Vitals:   06/12/14 1239 06/12/14 1250  BP: 157/94 154/97  Pulse: 83 85  Temp: 98.4 F (36.9 C) 98.6 F (37 C)  TempSrc: Oral Oral  Resp: 18   Height:  6\' 4"  (1.93 m)  Weight:  102.059 kg (225 lb)  SpO2: 98% 98%    Wt Readings from Last 3 Encounters:  06/12/14 102.059 kg (225 lb)  06/12/14 101.606 kg (224 lb)  04/06/14 102.967 kg (227 lb)    General:  Appears calm and comfortable Eyes: PERRL, normal lids, irises & conjunctiva ENT: grossly normal hearing, lips & tongue Neck: no LAD, masses or  thyromegaly Cardiovascular: RRR, no m/r/g. No LE edema. Respiratory: CTA bilaterally, no w/r/r. Normal respiratory effort. Abdomen: soft, ntnd positive bowel sounds Skin: no rash. Patent of third toe on right foot with open ulcer small amount of clear drainage. Peripheral tissue pale firm nontender. Tear aspect of third toe with erythema and swelling Musculoskeletal: grossly normal tone BUE/BLE Psychiatric: grossly normal mood and affect, speech fluent and appropriate Neurologic: grossly non-focal.          Labs on Admission:  Basic Metabolic Panel: No results found for this basename: NA, K, CL, CO2, GLUCOSE, BUN, CREATININE, CALCIUM, MG, PHOS,  in the last 168 hours Liver Function Tests: No results found for this basename: AST, ALT, ALKPHOS, BILITOT, PROT, ALBUMIN,  in the last 168 hours No results found for this basename: LIPASE, AMYLASE,  in the last 168 hours No results found for this basename: AMMONIA,  in the last 168 hours CBC: No results found for this basename: WBC, NEUTROABS, HGB, HCT, MCV, PLT,  in the last 168 hours Cardiac Enzymes: No results found for this basename: CKTOTAL, CKMB, CKMBINDEX, TROPONINI,  in the last 168 hours  BNP (last 3 results) No results found for this basename: PROBNP,  in the last 8760 hours CBG: No results found for this basename: GLUCAP,  in the last 168 hours  Radiological Exams on Admission: Dg Foot Complete Right  06/12/2014   CLINICAL DATA:  Third toe infection  EXAM: RIGHT FOOT COMPLETE - 3+ VIEW  COMPARISON:  None.  FINDINGS: No acute fracture or dislocation is noted. Erosive changes are noted in the third distal phalanx consistent with osteomyelitis. Degenerative changes of the first tarsal metatarsal articulation are seen  IMPRESSION: Erosive changes in the third distal phalanx consistent with osteomyelitis.   Electronically Signed   By: Alcide Clever M.D.   On: 06/12/2014 10:28    EKG:   Assessment/Plan Principal Problem:    Osteomyelitis: Admit to medical floor. X-ray consistent with osteomyelitis. Will obtain CBC with diff, CMET, blood cultures. Have requested orthopedic consult. No s/sx systemic infection. Will defer antibiotics until evaluated by ortho in hopes of obtaining good wound culture. If patient develops s/sx systemic infection will initiate antibiotics.   Active Problems:   Essential hypertension, benign: home meds include lisinopril will continue    Type II or unspecified type diabetes mellitus with unspecified complication, will obtain A1c and use SSI for optimal control. Will hold oral agents for now.     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 Planning surgery in winter months    Code Status: full DVT Prophylaxis: Family Communication: none present Disposition Plan: home when ready  Time spent: 47 mintues  [facility] Pager [phone]  **Disclaimer: This note may have been  dictated with voice recognition software. Similar sounding words can inadvertently be transcribed and this note may contain transcription errors which may not have been corrected upon publication of note.**

## 2014-06-12 NOTE — Progress Notes (Signed)
   Subjective:    Patient ID: [name], male    DOB: 1966-10-19, 48 y.o.   MRN: [id number]  HPI  This 48 y.o. male presents for evaluation of foot wound to right third toe.  He noticed the toe this am after work.  He has hx of flat feet and charcot joint due to peripheral neuropathy.  He has diabetes.  Review of Systems C/o right third toe pain   No chest pain, SOB, HA, dizziness, vision change, N/V, diarrhea, constipation, dysuria, urinary urgency or frequency, myalgias, arthralgias or rash.  Objective:   Physical Exam  Vital signs noted  Well developed well nourished male.  HEENT - Head atraumatic Normocephalic Respiratory - Lungs CTA bilateral Cardiac - RRR S1 and S2 without murmur GI - Abdomen soft Nontender and bowel sounds active x 4 Right foot - bottom of third toe with ulcer with clear drainage     Xray - Right 3rd toe with possible bony destruction Prelimnary reading by Anthony NoaWilliam Lakeidra Reliford,FNP Assessment & Plan:  Wound, open, foot with complication, right, sequela - Plan: DG Foot Complete Right, Ambulatory referral to Wound Clinic, levofloxacin (LEVAQUIN) 750 MG tablet, doxycycline (VIBRA-TABS) 100 MG tablet, Aerobic culture, HYDROcodone-acetaminophen (NORCO) 10-325 MG per tablet.  The Xrays show osteomyelitis and he should go to hospital.  Hospitalist at [facility] called and will accept for Medicine observation and he is advised to go to hospital to be admitted.  Deatra CanterWilliam J Shankar Silber FNP

## 2014-06-13 DIAGNOSIS — L97509 Non-pressure chronic ulcer of other part of unspecified foot with unspecified severity: Secondary | ICD-10-CM | POA: Diagnosis present

## 2014-06-13 DIAGNOSIS — E1149 Type 2 diabetes mellitus with other diabetic neurological complication: Secondary | ICD-10-CM | POA: Diagnosis present

## 2014-06-13 DIAGNOSIS — E1165 Type 2 diabetes mellitus with hyperglycemia: Secondary | ICD-10-CM | POA: Diagnosis present

## 2014-06-13 DIAGNOSIS — Z823 Family history of stroke: Secondary | ICD-10-CM | POA: Diagnosis not present

## 2014-06-13 DIAGNOSIS — L02619 Cutaneous abscess of unspecified foot: Secondary | ICD-10-CM | POA: Diagnosis present

## 2014-06-13 DIAGNOSIS — I10 Essential (primary) hypertension: Secondary | ICD-10-CM | POA: Diagnosis present

## 2014-06-13 DIAGNOSIS — M869 Osteomyelitis, unspecified: Secondary | ICD-10-CM | POA: Diagnosis present

## 2014-06-13 DIAGNOSIS — Z833 Family history of diabetes mellitus: Secondary | ICD-10-CM | POA: Diagnosis not present

## 2014-06-13 DIAGNOSIS — M79609 Pain in unspecified limb: Secondary | ICD-10-CM | POA: Diagnosis present

## 2014-06-13 DIAGNOSIS — IMO0002 Reserved for concepts with insufficient information to code with codable children: Secondary | ICD-10-CM | POA: Diagnosis present

## 2014-06-13 DIAGNOSIS — Z7982 Long term (current) use of aspirin: Secondary | ICD-10-CM | POA: Diagnosis not present

## 2014-06-13 DIAGNOSIS — Z79899 Other long term (current) drug therapy: Secondary | ICD-10-CM | POA: Diagnosis not present

## 2014-06-13 DIAGNOSIS — A4901 Methicillin susceptible Staphylococcus aureus infection, unspecified site: Secondary | ICD-10-CM | POA: Diagnosis present

## 2014-06-13 DIAGNOSIS — M9080 Osteopathy in diseases classified elsewhere, unspecified site: Secondary | ICD-10-CM | POA: Diagnosis present

## 2014-06-13 DIAGNOSIS — E1142 Type 2 diabetes mellitus with diabetic polyneuropathy: Secondary | ICD-10-CM | POA: Diagnosis present

## 2014-06-13 LAB — CBC
HEMATOCRIT: 45.2 % (ref 39.0–52.0)
HEMOGLOBIN: 15.8 g/dL (ref 13.0–17.0)
MCH: 31.2 pg (ref 26.0–34.0)
MCHC: 35 g/dL (ref 30.0–36.0)
MCV: 89.3 fL (ref 78.0–100.0)
Platelets: 163 10*3/uL (ref 150–400)
RBC: 5.06 MIL/uL (ref 4.22–5.81)
RDW: 12.2 % (ref 11.5–15.5)
WBC: 4.8 10*3/uL (ref 4.0–10.5)

## 2014-06-13 LAB — URINALYSIS, ROUTINE W REFLEX MICROSCOPIC
Bilirubin Urine: NEGATIVE
HGB URINE DIPSTICK: NEGATIVE
Ketones, ur: NEGATIVE mg/dL
Leukocytes, UA: NEGATIVE
Nitrite: NEGATIVE
Protein, ur: NEGATIVE mg/dL
Specific Gravity, Urine: 1.02 (ref 1.005–1.030)
Urobilinogen, UA: 0.2 mg/dL (ref 0.0–1.0)
pH: 6 (ref 5.0–8.0)

## 2014-06-13 LAB — BASIC METABOLIC PANEL
ANION GAP: 9 (ref 5–15)
BUN: 11 mg/dL (ref 6–23)
CHLORIDE: 99 meq/L (ref 96–112)
CO2: 29 meq/L (ref 19–32)
CREATININE: 0.89 mg/dL (ref 0.50–1.35)
Calcium: 8.9 mg/dL (ref 8.4–10.5)
GFR calc Af Amer: >90 mL/min (ref >90)
GFR calc non Af Amer: >90 mL/min (ref >90)
Glucose, Bld: 187 mg/dL — ABNORMAL HIGH (ref 70–99)
POTASSIUM: 4.6 meq/L (ref 3.7–5.3)
Sodium: 137 mEq/L (ref 137–147)

## 2014-06-13 LAB — HEMOGLOBIN A1C
Hgb A1c MFr Bld: 9.7 % — ABNORMAL HIGH (ref <5.7)
MEAN PLASMA GLUCOSE: 232 mg/dL — AB (ref <117)

## 2014-06-13 LAB — URINE MICROSCOPIC-ADD ON

## 2014-06-13 LAB — GLUCOSE, CAPILLARY
GLUCOSE-CAPILLARY: 174 mg/dL — AB (ref 70–99)
GLUCOSE-CAPILLARY: 198 mg/dL — AB (ref 70–99)
Glucose-Capillary: 158 mg/dL — ABNORMAL HIGH (ref 70–99)
Glucose-Capillary: 152 mg/dL — ABNORMAL HIGH (ref 70–99)

## 2014-06-13 NOTE — Progress Notes (Signed)
Patient ID: [name], male   DOB: Jan 03, 1966, 48 y.o.   MRN: [id number] The patient has osteomyelitis of his toe with a diabetic plantar ulcer on the third digit of the left foot. I've asked podiatry to consult on him it's okay if it's not until Monday

## 2014-06-13 NOTE — Progress Notes (Signed)
  PROGRESS NOTE  [name] [id number] DOB: 11/27/1965 DOA: 06/12/2014 PCP: Rudi HeapMOORE, DONALD, MD  Summary: 48 year old man with history of diabetes mellitus, bilateral Charcot deformities of ankles/feet who is admitted for osteomyelitis right third toe.  Assessment/Plan: 1. Osteomyelitis right third toe with overlying cellulitis. No systemic signs or symptoms. Remains afebrile. Appears somewhat better today. 2. 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 Stable. 3. Essential hypertension.   Plan to continue IV antibiotics. Orthopedics is recommended podiatry consultation. Would anticipate discharge home in 48 hours if continues to improve.  Code Status: full code DVT prophylaxis: Lovenox Family Communication: none present Disposition Plan: home  Brendia Sacksaniel Goodrich, MD  [facility]  Pager [phone] If 7PM-7AM, please contact night-coverage at [email or web address], password [email or web address] 06/13/2014, 3:27 PM  LOS: 1 day   Consultants:  Orthopedics  Procedures:    Antibiotics:  Vancomycin 8/7 >>   HPI/Subjective: Feels good. No pain. Ambulating to the bathroom without difficulty. No drainage.  Objective: Filed Vitals:   06/12/14 2034 06/13/14 0431 06/13/14 0802 06/13/14 1452  BP: 138/82 119/75 121/82 131/83  Pulse: 64 75  75  Temp: 97.7 F (36.5 C) 98.6 F (37 C)  98.7 F (37.1 C)  TempSrc: Oral Oral  Oral  Resp: 20 20  20   Height:      Weight:      SpO2: 100% 100%  100%    Intake/Output Summary (Last 24 hours) at 06/13/14 1527 Last data filed at 06/13/14 1453  Gross per 24 hour  Intake    480 ml  Output   1450 ml  Net   -970 ml     Filed Weights   06/12/14 1250  Weight: 102.059 kg (225 lb)    Exam:     Afebrile, vital signs stable. No hypoxia. Gen. Appears calm, comfortable.  Psych. Alert. Speech fluent and clear.  Cardiovascular. Regular rate and rhythm. No murmur, rub gallop. No lower extremity edema.  Respiratory. Clear to auscultation  bilaterally. No wheezes, rales or rhonchi. Normal respiratory effort.  Skin. Somewhat decreased erythema proximally third toe. Distal toe still erythematous. Ulcer on tip of toe. Try.  Data Reviewed:  Chemistry: Basic metabolic panel unremarkable. Blood sugars stable.  Heme: CBC stable.  ID: Blood cultures no growth today.  Scheduled Meds: . gabapentin  300 mg Oral BID  . insulin aspart  0-15 Units Subcutaneous TID WC  . insulin aspart  0-5 Units Subcutaneous QHS  . lisinopril  20 mg Oral Daily  . senna  1 tablet Oral BID  . vancomycin  1,000 mg Intravenous Q8H   Continuous Infusions:   Principal Problem:   Osteomyelitis of toe of right foot Active Problems:   Essential hypertension, benign   Type II or unspecified type diabetes mellitus with unspecified complication, uncontrolled   Neuropathy in diabetes   Osteomyelitis   Time spent 15 minutes

## 2014-06-14 DIAGNOSIS — A5211 Tabes dorsalis: Secondary | ICD-10-CM

## 2014-06-14 LAB — BASIC METABOLIC PANEL
Anion gap: 9 (ref 5–15)
BUN: 12 mg/dL (ref 6–23)
CALCIUM: 8.8 mg/dL (ref 8.4–10.5)
CO2: 30 mEq/L (ref 19–32)
Chloride: 98 mEq/L (ref 96–112)
Creatinine, Ser: 0.92 mg/dL (ref 0.50–1.35)
GFR calc Af Amer: >90 mL/min (ref >90)
Glucose, Bld: 300 mg/dL — ABNORMAL HIGH (ref 70–99)
POTASSIUM: 4.5 meq/L (ref 3.7–5.3)
Sodium: 137 mEq/L (ref 137–147)

## 2014-06-14 LAB — GLUCOSE, CAPILLARY
GLUCOSE-CAPILLARY: 153 mg/dL — AB (ref 70–99)
GLUCOSE-CAPILLARY: 189 mg/dL — AB (ref 70–99)
GLUCOSE-CAPILLARY: 225 mg/dL — AB (ref 70–99)
Glucose-Capillary: 261 mg/dL — ABNORMAL HIGH (ref 70–99)

## 2014-06-14 LAB — VANCOMYCIN, TROUGH: Vancomycin Tr: 11.3 ug/mL (ref 10.0–20.0)

## 2014-06-14 MED ORDER — VANCOMYCIN HCL 10 G IV SOLR
1250.0000 mg | Freq: Three times a day (TID) | INTRAVENOUS | Status: DC
  Administered 2014-06-14 – 2014-06-18 (×12): 1250 mg via INTRAVENOUS
  Filled 2014-06-14 (×15): qty 1250

## 2014-06-14 MED ORDER — INSULIN ASPART 100 UNIT/ML ~~LOC~~ SOLN
0.0000 [IU] | Freq: Every day | SUBCUTANEOUS | Status: DC
  Administered 2014-06-14: 2 [IU] via SUBCUTANEOUS

## 2014-06-14 MED ORDER — INSULIN ASPART 100 UNIT/ML ~~LOC~~ SOLN
0.0000 [IU] | Freq: Three times a day (TID) | SUBCUTANEOUS | Status: DC
  Administered 2014-06-15: 4 [IU] via SUBCUTANEOUS
  Administered 2014-06-15: 7 [IU] via SUBCUTANEOUS
  Administered 2014-06-15 – 2014-06-16 (×2): 4 [IU] via SUBCUTANEOUS
  Administered 2014-06-16 (×2): 7 [IU] via SUBCUTANEOUS
  Administered 2014-06-17 (×2): 3 [IU] via SUBCUTANEOUS
  Administered 2014-06-17: 7 [IU] via SUBCUTANEOUS
  Administered 2014-06-18: 3 [IU] via SUBCUTANEOUS
  Administered 2014-06-18: 7 [IU] via SUBCUTANEOUS

## 2014-06-14 NOTE — Progress Notes (Signed)
ANTIBIOTIC CONSULT NOTE   Pharmacy Consult for Vancomycin Indication: Osteomyelitis  Allergies  Allergen Reactions  . Penicillins Other (See Comments)    Pt broke out in sweat when he was 14    Patient Measurements: Height: 6\' 4"  (193 cm) Weight: 225 lb (102.059 kg) IBW/kg (Calculated) : 86.8 Adjusted Body Weight:   Vital Signs: Temp: 97.7 F (36.5 C) (08/09 0426) Temp src: Oral (08/09 0426) BP: 108/82 mmHg (08/09 0843) Pulse Rate: 68 (08/09 0426) Intake/Output from previous day: 08/08 0701 - 08/09 0700 In: 720 [P.O.:720] Out: 2825 [Urine:2825] Intake/Output from this shift:    Labs:  Recent Labs  06/12/14 1346 06/13/14 0627 06/14/14 0559  WBC 6.3 4.8  --   HGB 16.2 15.8  --   PLT 179 163  --   CREATININE 0.88 0.89 0.92   Estimated Creatinine Clearance: 119.2 ml/min (by C-G formula based on Cr of 0.92).  Recent Labs  06/14/14 0841  VANCOTROUGH 11.3     Microbiology: Recent Results (from the past 720 hour(s))  CULTURE, BLOOD (ROUTINE X 2)     Status: None   Collection Time    06/12/14  2:36 PM      Result Value Ref Range Status   Specimen Description LEFT ANTECUBITAL   Final   Special Requests BOTTLES DRAWN AEROBIC AND ANAEROBIC 10CC   Final   Culture NO GROWTH 2 DAYS   Final   Report Status PENDING   Incomplete  CULTURE, BLOOD (ROUTINE X 2)     Status: None   Collection Time    06/12/14  2:37 PM      Result Value Ref Range Status   Specimen Description RIGHT ANTECUBITAL   Final   Special Requests BOTTLES DRAWN AEROBIC AND ANAEROBIC 12CC   Final   Culture NO GROWTH 2 DAYS   Final   Report Status PENDING   Incomplete    Medical History: Past Medical History  Diagnosis Date  . Boil     numerous boils in past  . Diabetes mellitus   . Hypertension   . Neuropathy associated with endocrine disorder     Medications:  Scheduled:  . gabapentin  300 mg Oral BID  . insulin aspart  0-15 Units Subcutaneous TID WC  . insulin aspart  0-5 Units  Subcutaneous QHS  . lisinopril  20 mg Oral Daily  . senna  1 tablet Oral BID  . vancomycin  1,250 mg Intravenous Q8H   Assessment: Callus type wound pad 3rd toe on right foot XRAY concerning for osteomyelitis Orthopedics recommending podiatry consultation Patient afebrile, appears better Vancomycin trough below goal  Goal of Therapy:  Vancomycin trough level 15-20 mcg/ml  Plan:  Increase Vancomycin to 1250 mg IV every 8 hours Vancomycin trough at steady state Monitor renal function Labs per protocol  Raquel JamesPittman, Zerek Litsey Bennett 06/14/2014,9:52 AM

## 2014-06-14 NOTE — Plan of Care (Signed)
Lab called with critical value from previous blood cultures - Gram po cocci and clusters.  Dr. Truitt MerleNotified.

## 2014-06-14 NOTE — Progress Notes (Signed)
  PROGRESS NOTE  Anthony RavensKeith B [name] [id number] DOB: [birth year] DOA: 06/12/2014 PCP: Rudi HeapMOORE, DONALD, MD  Summary: 48 year old man with history of diabetes mellitus, bilateral Charcot deformities of ankles/feet who is admitted for osteomyelitis right third toe.  Assessment/Plan: 1. Osteomyelitis right third toe with overlying cellulitis. No systemic signs or symptoms. Remains afebrile. Appears somewhat better today. 2. 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 Stable. 3. Essential hypertension. Stable.   Overall seems to be improving. No systemic signs or symptoms. Plan to continue IV antibiotics. Await podiatry recommendations.  Increased sliding scale to resistant.  Would anticipate discharge home 8/10 of the procedure planned.  Code Status: full code DVT prophylaxis: Lovenox Family Communication: none present Disposition Plan: home  Brendia Sacksaniel Abdi Husak, MD  [facility]  Pager [phone] If 7PM-7AM, please contact night-coverage at [email or web address], password [email or web address] 06/14/2014, 6:32 PM  LOS: 2 days   Consultants:  Orthopedics  Procedures:    Antibiotics:  Vancomycin 8/7 >>   HPI/Subjective: No complaints. He is walking without difficulty. He feels that his toe is improving.  Objective: Filed Vitals:   06/13/14 2138 06/14/14 0426 06/14/14 0843 06/14/14 1449  BP: 127/93 112/78 108/82 119/83  Pulse: 68 68  79  Temp: 98 F (36.7 C) 97.7 F (36.5 C)  98.6 F (37 C)  TempSrc: Oral Oral  Oral  Resp: 20 20  20   Height:      Weight:      SpO2: 100% 99%  100%    Intake/Output Summary (Last 24 hours) at 06/14/14 1832 Last data filed at 06/14/14 1031  Gross per 24 hour  Intake    240 ml  Output   1950 ml  Net  -1710 ml     Filed Weights   06/12/14 1250  Weight: 102.059 kg (225 lb)    Exam:     Afebrile, vitals stable. No hypoxia.  Appears calm and comfortable.  Speech fluent and clear. Alert.  Cardiovascular  regular rate and rhythm. No murmur, rub or gallop. No lower extremity edema.  Respiratory clear to auscultation bilaterally. No wheezes, rales rhonchi. Normal respiratory effort.  Right third toe with some regression of erythema and some less edema. No drainage noted.  Data Reviewed:  Excellent urine output, 2825.  Capillary blood sugars are stable. Basic metabolic panel unremarkable.  Scheduled Meds: . gabapentin  300 mg Oral BID  . insulin aspart  0-15 Units Subcutaneous TID WC  . insulin aspart  0-5 Units Subcutaneous QHS  . lisinopril  20 mg Oral Daily  . senna  1 tablet Oral BID  . vancomycin  1,250 mg Intravenous Q8H   Continuous Infusions:   Principal Problem:   Osteomyelitis of toe of right foot Active Problems:   Essential hypertension, benign   Type II or unspecified type diabetes mellitus with unspecified complication, uncontrolled   Neuropathy in diabetes   Osteomyelitis   Time spent 15 minutes

## 2014-06-15 LAB — BASIC METABOLIC PANEL
ANION GAP: 8 (ref 5–15)
BUN: 12 mg/dL (ref 6–23)
CHLORIDE: 102 meq/L (ref 96–112)
CO2: 28 mEq/L (ref 19–32)
Calcium: 8.8 mg/dL (ref 8.4–10.5)
Creatinine, Ser: 0.94 mg/dL (ref 0.50–1.35)
GFR calc non Af Amer: >90 mL/min (ref >90)
Glucose, Bld: 237 mg/dL — ABNORMAL HIGH (ref 70–99)
POTASSIUM: 4.3 meq/L (ref 3.7–5.3)
SODIUM: 138 meq/L (ref 137–147)

## 2014-06-15 LAB — AEROBIC CULTURE

## 2014-06-15 LAB — GLUCOSE, CAPILLARY
GLUCOSE-CAPILLARY: 203 mg/dL — AB (ref 70–99)
GLUCOSE-CAPILLARY: 174 mg/dL — AB (ref 70–99)
Glucose-Capillary: 163 mg/dL — ABNORMAL HIGH (ref 70–99)
Glucose-Capillary: 137 mg/dL — ABNORMAL HIGH (ref 70–99)

## 2014-06-15 MED ORDER — INSULIN GLARGINE 100 UNIT/ML ~~LOC~~ SOLN
20.0000 [IU] | Freq: Every day | SUBCUTANEOUS | Status: DC
  Administered 2014-06-15 – 2014-06-16 (×2): 20 [IU] via SUBCUTANEOUS
  Filled 2014-06-15 (×2): qty 0.2

## 2014-06-15 NOTE — Progress Notes (Signed)
[facility] PROGRESS NOTE  ASAF ELMQUIST ZOX:[id number] DOB: 22-Sep-1966 DOA: 06/12/2014 PCP: [name], MD   Assessment/Plan:  1. Osteomyelitis right third toe with overlying cellulitis. One out of 2 positive blood culture. Likely contaminant.  No systemic signs or symptoms. Remains afebrile. Less erythema and swelling. Await podiatry recommendations. Vancomycin day #4. Consider transitioning to po antibiotics if appropriated 2. 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 CBG range 174-203. Adding Lantus per diabetes coordinator recommendations. monitor 3. Essential hypertension. Stable.  Code Status: full Family Communication: none present Disposition Plan: home hopefully later today or in am   Consultants:  Orthopedics  podiatry  Procedures:  none  Antibiotics:  Vancomycin 06/12/14>>  HPI/Subjective: Sitting up in bed eating breakfast. Denies pain/discomfort.   Objective: Filed Vitals:   06/15/14 0657  BP: 105/72  Pulse: 76  Temp: 97.8 F (36.6 C)  Resp: 14    Intake/Output Summary (Last 24 hours) at 06/15/14 1244 Last data filed at 06/15/14 0659  Gross per 24 hour  Intake      0 ml  Output    500 ml  Net   -500 ml   Filed Weights   06/12/14 1250  Weight: 102.059 kg (225 lb)    Exam:   General:  Well nourished calm and appears comfortable  Cardiovascular: RRR no m/g/r/ no LE edema  Respiratory: normal effort BS clear bilaterally to auscultation. i hear no wheeze/rhonchi or crackles  Abdomen: non-distended non-tender with +BS throughout.   Musculoskeletal: third toe on right foot less swollen and only mild erythema. Non-tender. Callus/wound on posterior to without drainage   Data Reviewed: Basic Metabolic Panel:  Recent Labs Lab 06/12/14 1346 06/13/14 0627 06/14/14 0559 06/15/14 0646  NA 138 137 137 138  K 4.3 4.6 4.5 4.3  CL 99 99 98 102  CO2 30 29 30 28   GLUCOSE 220* 187* 300* 237*   BUN 16 11 12 12   CREATININE 0.88 0.89 0.92 0.94  CALCIUM 9.5 8.9 8.8 8.8   Liver Function Tests:  Recent Labs Lab 06/12/14 1346  AST 13  ALT 18  ALKPHOS 173*  BILITOT 0.9  PROT 7.4  ALBUMIN 3.7   No results found for this basename: LIPASE, AMYLASE,  in the last 168 hours No results found for this basename: AMMONIA,  in the last 168 hours CBC:  Recent Labs Lab 06/12/14 1346 06/13/14 0627  WBC 6.3 4.8  NEUTROABS 3.6  --   HGB 16.2 15.8  HCT 45.2 45.2  MCV 89.3 89.3  PLT 179 163   Cardiac Enzymes: No results found for this basename: CKTOTAL, CKMB, CKMBINDEX, TROPONINI,  in the last 168 hours BNP (last 3 results) No results found for this basename: PROBNP,  in the last 8760 hours CBG:  Recent Labs Lab 06/14/14 1207 06/14/14 1654 06/14/14 2114 06/15/14 0819 06/15/14 1155  GLUCAP 153* 189* 225* 203* 174*    Recent Results (from the past 240 hour(s))  CULTURE, BLOOD (ROUTINE X 2)     Status: None   Collection Time    06/12/14  2:36 PM      Result Value Ref Range Status   Specimen Description LEFT ANTECUBITAL   Final   Special Requests BOTTLES DRAWN AEROBIC AND ANAEROBIC 10CC   Final   Culture NO GROWTH 3 DAYS   Final   Report Status PENDING   Incomplete  CULTURE, BLOOD (ROUTINE X 2)     Status: None   Collection Time  06/12/14  2:37 PM      Result Value Ref Range Status   Specimen Description RIGHT ANTECUBITAL   Final   Special Requests BOTTLES DRAWN AEROBIC AND ANAEROBIC 12CC   Final   Culture     Final   Value: GRAM POSITIVE COCCI IN CLUSTERS     Gram Stain Report Called to,Read Back By and Verified With: M.WRIGHT AT 1907 ON 06/14/14 BY S.VANHOORNE     Performed [facility]   Report Status PENDING   Incomplete     Studies: No results found.  Scheduled Meds: . gabapentin  300 mg Oral BID  . insulin aspart  0-20 Units Subcutaneous TID WC  . insulin aspart  0-5 Units Subcutaneous QHS  . insulin glargine  20 Units Subcutaneous QHS  .  lisinopril  20 mg Oral Daily  . senna  1 tablet Oral BID  . vancomycin  1,250 mg Intravenous Q8H   Continuous Infusions:   Principal Problem:   Osteomyelitis of toe of right foot Active Problems:   Essential hypertension, benign   Type II or unspecified type diabetes mellitus with unspecified complication, uncontrolled   Neuropathy in diabetes   Osteomyelitis    Time spent: 30 minutes    [facility] Response CenterBLACK,KAREN M  [facility] Pager [phone]. If 7PM-7AM, please contact night-coverage at [email or web address], password [email or web address] 06/15/2014, 12:44 PM  LOS: 3 days

## 2014-06-15 NOTE — Progress Notes (Signed)
Inpatient Diabetes Program Recommendations  AACE/ADA: New Consensus Statement on Inpatient Glycemic Control (2013)  Target Ranges:  Prepandial:   less than 140 mg/dL      Peak postprandial:   less than 180 mg/dL (1-2 hours)      Critically ill patients:  140 - 180 mg/dL   Reason for Assessment: Sub-optimal glycemic control  Diabetes history: Type 2 diabetes Outpatient Diabetes medications: Glucotrol 5 mg bid, Metformin 1000 mg bid  Current orders for Inpatient glycemic control: Resistant Novolog correction tid with meals and HS correction  Results for [name], [name] (MRN [id number]) as of 06/15/2014 10:31  Ref. Range 06/14/2014 07:47 06/14/2014 12:07 06/14/2014 16:54 06/14/2014 21:14 06/15/2014 08:19  Glucose-Capillary Latest Range: 70-99 mg/dL 865261 (H) 784153 (H) 696189 (H) 225 (H) 203 (H)   Note:  Sub-optimal glycemic control.  A1C of 9.7 indicates sub-optimal control prior to admission as well.  May benefit from addition of Lantus insulin 20 units daily or at HS to help improve glycemic control.  If patient is discharged after seeing podiatrist, could be started by PCP as an outpatient.  Attempted to contact patient by telephone, but line was busy.  Will [employer] diabetes class information to his nurse so he can be encouraged to attend.  Thank you.  Vincentina Sollers S. Elsie Lincolnouth, RN, CNS, CDE Inpatient Diabetes Program, team pager [phone]

## 2014-06-15 NOTE — Care Management Note (Unsigned)
    Page 1 of 1   06/18/2014     11:34:58 AM CARE MANAGEMENT NOTE 06/18/2014  Patient:  [name],[name]   Account Number:  [id number]  Date Initiated:  06/15/2014  Documentation initiated by:  Anibal HendersonBOLDEN,Reubin Bushnell  Subjective/Objective Assessment:   Admitted with osteomyelitis of toe. He is from home, is independent, and will return home. May need IV ABX for several weeks     Action/Plan:   He is concerned about missing work, and would like to go back to work asap. He is worried he will dislodge a PICC due to lifting at his job. Will discuss with MD   Anticipated DC Date:  06/18/2014   Anticipated DC Plan:  HOME W HOME HEALTH SERVICES      DC Planning Services  CM consult      [facility]   Choice offered to / List presented to:  C-1 Patient        HH arranged  HH-1 RN      [facility]   Status of service:  Completed, signed off Medicare Important Message given?   (If response is "NO", the following Medicare IM given date fields will be blank) Date Medicare IM given:   Medicare IM given by:   Date Additional Medicare IM given:   Additional Medicare IM given by:    Discharge Disposition:  HOME/SELF CARE  Per UR Regulation:  Reviewed for med. necessity/level of care/duration of stay  If discussed at Long Length of Stay Meetings, dates discussed:    Comments:  06/18/14 1130 Anibal HendersonGeneva Derelle Cockrell RN/CM Pt being D/C home on PO ABX. Had  partial toe amputation yesterday, but will return to MD office for dressing changes. Has been started on insulin pen, but was instructed by RN, and has a family member who is an Charity fundraiserN, and she will come by each day to give insulin and instruct pt until he feels comfortable with this. So, pt refuses HH. Does not feel he needs this. 06/15/14 1300 Anibal HendersonGeneva Quentez Lober RN/CM

## 2014-06-15 NOTE — Consult Note (Signed)
Podiatry Consult Note  Reason for Consultation:  Osteomyelitis of right third toe.  History of Present Illness: [name] is a 48 y.o. male admitted on 06/12/2014 for osteomyelitis of his right third toe.  He states that he has had a callus on the tip of his right third toe for some time.  The day before his admission he started noticing drainage.  He started experiencing pain.  He relates history of an ulceration of his right great toe.  He has been treated in the past by Dr. Tanda Rockers at [facility].  The ulceration of his right great toe responded to local wound care using Iodosorb.  Past Medical History  Diagnosis Date  . Boil     numerous boils in past  . Diabetes mellitus   . Hypertension   . Neuropathy associated with endocrine disorder    Scheduled Meds: . gabapentin  300 mg Oral BID  . insulin aspart  0-20 Units Subcutaneous TID WC  . insulin aspart  0-5 Units Subcutaneous QHS  . insulin glargine  20 Units Subcutaneous QHS  . lisinopril  20 mg Oral Daily  . senna  1 tablet Oral BID  . vancomycin  1,250 mg Intravenous Q8H   Continuous Infusions:  PRN Meds:.acetaminophen, acetaminophen, alum & mag hydroxide-simeth, HYDROcodone-acetaminophen, morphine injection, ondansetron (ZOFRAN) IV, ondansetron, traZODone  Allergies  Allergen Reactions  . Penicillins Other (See Comments)    Pt broke out in sweat when he was 14   Past Surgical History  Procedure Laterality Date  . Hernia repair     Family History  Problem Relation Age of Onset  . Stroke Father   . Diabetes Brother   . Stroke Brother   . Diabetes Mother    Social History:  reports that he has never smoked. He does not have any smokeless tobacco history on file. He reports that he does not drink alcohol or use illicit drugs.  Review of Systems: Constitutional:  No fever.  No chills. GI Symptoms:  No nausea.  No vomiting.  Physical Examination: Vital signs in last 24 hours:    Temp:  [97.8 F (36.6 C)-98.3 F (36.8 C)] 98.3 F (36.8 C) (08/10 1419) Pulse Rate:  [72-76] 73 (08/10 1419) Resp:  [14-20] 16 (08/10 1419) BP: (105-135)/(72-85) 135/85 mmHg (08/10 1419) SpO2:  [97 %-100 %] 100 % (08/10 1419)  The patient is a pleasant 48 year old male in no acute distress.  There is a full-thickness ulceration present along the distal tip of the right third toe is surrounding hyperkeratotic tissue present.  The wound bed is fibrotic.  The distal aspect of the right third toe is edematous and warm.  Dorsalis pedis pulse is palpable bilaterally.  Posterior tibial pulses palpable bilaterally.  There is collapse of the medial longitudinal arch of both feet.  Protective sensation is diminished bilaterally.  Lab/Test Results:   Recent Labs  06/13/14 0627 06/14/14 0559 06/15/14 0646  WBC 4.8  --   --   HGB 15.8  --   --   HCT 45.2  --   --   PLT 163  --   --   NA 137 137 138  K 4.6 4.5 4.3  CL 99 98 102  CO2 29 30 28   BUN 11 12 12   CREATININE 0.89 0.92 0.94  GLUCOSE 187* 300* 237*  CALCIUM 8.9 8.8 8.8    Recent Results (from the past 240 hour(s))  AEROBIC CULTURE     Status: Abnormal  Collection Time    06/12/14 10:24 AM      Result Value Ref Range Status   Aerobic Bacterial Culture Final report (*)  Final   Result 1 Staphylococcus aureus (*)  Final   Comment: Heavy growth     Based on resistance to penicillin and susceptibility to oxacillin     this isolate would be susceptible to:     * Penicillinase-stable penicillins; such as:         Cloxacillin         Dicloxacillin         Nafcillin     * Beta-lactam/beta-lactamase inhibitor combinations; such as:         Amoxicillin-clavulanic acid         Ampicillin-sulbactam     * Antistaphylococcal cephems; such as:         Cefaclor         Cefuroxime     * Antistaphylococcal carbapenems; such as:         Imipenem         Meropenem   Result 2 Comment (*)  Final   Comment: Beta hemolytic Streptococcus,  group B     Moderate growth     Penicillin and ampicillin are drugs of choice for treatment of     beta-hemolytic streptococcal infections. Susceptibility testing of     penicillins and other beta-lactam agents approved by the FDA for     treatment of beta-hemolytic streptococcal infections need not be     performed routinely because nonsusceptible isolates are extremely     rare in any beta-hemolytic streptococcus and have not been reported     for Streptococcus pyogenes (group A). (CLSI 2011)   Result 3 Routine flora   Final   Comment: Heavy growth   ANTIMICROBIAL SUSCEPTIBILITY Comment   Final   Comment:       ** S = Susceptible; I = Intermediate; R = Resistant **                        P = Positive; N = Negative                 MICS are expressed in micrograms per mL        Antibiotic                 RSLT#1    RSLT#2    RSLT#3    RSLT#4     Ciprofloxacin                  R     Clindamycin                    S     Erythromycin                   S     Gentamicin                     S     Levofloxacin                   I     Linezolid                      S     Moxifloxacin                   R  Oxacillin                      S     Penicillin                     R     Quinupristin/Dalfopristin      S     Rifampin                       S     Tetracycline                   S     Trimethoprim/Sulfa             S     Vancomycin                     S  CULTURE, BLOOD (ROUTINE X 2)     Status: None   Collection Time    06/12/14  2:36 PM      Result Value Ref Range Status   Specimen Description LEFT ANTECUBITAL   Final   Special Requests BOTTLES DRAWN AEROBIC AND ANAEROBIC 10CC   Final   Culture NO GROWTH 3 DAYS   Final   Report Status PENDING   Incomplete  CULTURE, BLOOD (ROUTINE X 2)     Status: None   Collection Time    06/12/14  2:37 PM      Result Value Ref Range Status   Specimen Description BLOOD RIGHT ANTECUBITAL   Final   Special Requests BOTTLES DRAWN AEROBIC AND  ANAEROBIC 12CC   Final   Culture  Setup Time     Final   Value: 06/14/2014 19:00     [facility]   Culture     Final   Value: GRAM POSITIVE COCCI IN CLUSTERS     Note: Gram Stain Report Called to,Read Back By and Verified With: M.WRIGHT AT 1907 ON 06/14/14 BY S.VANHORNE     [facility]formed at [facility]   Report Status PENDING   Incomplete     No results found.  Assessment: Osteomyelitis of the third toe of the right foot secondary to ulceration from neuropathy attributed diabetes mellitus.  Plan: The exam findings, diagnoses and treatment options were explained to the patient.  Given the erosive changes on plain film radiography, I recommend partial amputation of the right third toe.  The benefits and risk of this procedure were explained to him.  He agrees with this course of care.  For now, I recommend continuing IV antibiotic therapy.  Iodosorb dressing is to be applied to the right third toe and changed daily.  Surgery is tentatively planned for Wednesday morning.  I spoke with the patient's mother at his request regarding the planned surgery.  Rozelia Catapano IVAN 06/15/2014, 6:40 PM

## 2014-06-15 NOTE — Progress Notes (Signed)
Patient seen, independently examined and chart reviewed. I agree with exam, assessment and plan discussed with Toya SmothersKaren Black, NP.  Subjective: 1/2 blood cultures positive for gram-positive cocci in clusters. Staphylococcal S. aureus, beta-hemolytic strep and wound culture.  No complaints. No pain. Feels like to is improving.  Objective: Afebrile, vital signs stable. No hypoxia.  Gen. Appears calm and comfortable.  Psych. Alert. Speech fluent and clear.  Cardiovascular. Regular rate and rhythm. No murmur, rub or gallop. No lower extremity edema.  Respiratory. Clear to auscultation bilaterally. No wheezes, rales or rhonchi. Normal respiratory effort.  Skin. Right third toe appears unchanged today there is still some edema and erythema of the toe. Ulcer appears unchanged.   Basic metabolic panel unremarkable. Capillary blood sugars stable.  Right third toe cellulitis, osteomyelitis secondary to MSSA and Strep. Will adjust antibiotic therapy based on culture data. Follow-up podiatry recs  1/2 GPC bacteremia, suspect contaminant. Patient never had systemic symptoms or signs. F/u culture. If MSSA or Strep will need repeat cultures before placing a PICC.  Brendia Sacksaniel Kaylenn Civil, MD [facility] [phone]

## 2014-06-16 LAB — GLUCOSE, CAPILLARY
GLUCOSE-CAPILLARY: 208 mg/dL — AB (ref 70–99)
GLUCOSE-CAPILLARY: 213 mg/dL — AB (ref 70–99)
GLUCOSE-CAPILLARY: 168 mg/dL — AB (ref 70–99)
Glucose-Capillary: 161 mg/dL — ABNORMAL HIGH (ref 70–99)

## 2014-06-16 LAB — SURGICAL PCR SCREEN
MRSA, PCR: NEGATIVE
Staphylococcus aureus: POSITIVE — AB

## 2014-06-16 MED ORDER — INSULIN ASPART 100 UNIT/ML ~~LOC~~ SOLN
3.0000 [IU] | Freq: Three times a day (TID) | SUBCUTANEOUS | Status: DC
  Administered 2014-06-16 – 2014-06-17 (×3): 3 [IU] via SUBCUTANEOUS

## 2014-06-16 MED ORDER — LIVING WELL WITH DIABETES BOOK
Freq: Once | Status: DC
  Filled 2014-06-16: qty 1

## 2014-06-16 NOTE — Progress Notes (Signed)
Spoke with patient about diabetes and home regimen for diabetes control. Patient reports that he is followed by his PCP for diabetes management and currently he takes Glipizide 5 mg daily and Metformin 1000 mg BID as an outpatient for diabetes control. Patient states that he checks his blood glucose at home several times a day and it usually runs in the low to mid 100's mg/dl. Inquired about knowledge about A1C and patient reports that he knows what an A1C is but does not recall his most recent A1C.  Discussed A1C results (9.7%% on 06/12/14) and explained what an A1C is, basic pathophysiology of DM Type 2, basic home care, importance of checking CBGs and maintaining good CBG control to prevent long-term and short-term complications. Discussed impact of nutrition, exercise, stress, infection, sickness, and medications on diabetes control.  Patient reports that he does not know if the 9.7% A1C is an improvement in glycemic control or not. Discussed current insulin regimen patient as ordered while inpatient and inquired about the possibility of using insulin as an outpatient if MD orders at time of discharge. Patient states that he prefers to continue his oral DM medications if at all possible. Explained importance of tighter glycemic control especially after surgery for wound healing. Patient reports that he is willing to take insulin if he has too but is hopeful that after he has surgery and his infection is treated with antibiotics that his blood glucose will improve. Patient verbalized understanding of information discussed and he states that he has no further questions at this time related to diabetes.   If discharge plan for glycemic control is for patient to discharge on insulin, please advise nursing staff so that patient can receive education on insulin administration (RN can order insulin starter kit, have patient watch diabetes education videos, and allow patient to administer his own insulin injections  while inpatient).   Thanks, Barnie Alderman, RN, MSN, CCRN Diabetes Coordinator [facility]patient Diabetes [facility] [phone] (Team Pager) [phone] ([facility] office) [phone] [facility] office)

## 2014-06-16 NOTE — Progress Notes (Signed)
[facility] PROGRESS NOTE  [name] ZOX:[id number] DOB: [birth year] DOA: 06/12/2014 PCP: Rudi HeapMOORE, DONALD, MD  Assessment/Plan: 1. Osteomyelitis right third toe with overlying cellulitis. One out of 2 positive blood culture. Likely contaminant. No systemic signs or symptoms. Remains afebrile. Less erythema and swelling. appreciate podiatry recommendations. Scheduled for partial amputation tomorrow.  Vancomycin day #5.  2. 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 CBG range 137-208.  Will add meal coverage. Continue SSI and lantus. monitor 3. Essential hypertension. Stable.    Code Status: full Family Communication: none present Disposition Plan: home when ready   Consultants:  podiatry  Procedures:  none  Antibiotics:  Vancomycin 06/12/14>>  HPI/Subjective: Awake alert. Denies pain/discomfort. Verbalizes understanding of procedure scheduled tomorrow  Objective: Filed Vitals:   06/16/14 0704  BP: 109/74  Pulse: 80  Temp: 98.4 F (36.9 C)  Resp: 20    Intake/Output Summary (Last 24 hours) at 06/16/14 0916 Last data filed at 06/16/14 0239  Gross per 24 hour  Intake    240 ml  Output    400 ml  Net   -160 ml   Filed Weights   06/12/14 1250  Weight: 102.059 kg (225 lb)    Exam:   General:  Well nourished NAD  Cardiovascular: RRR No MGR No LE edema  Respiratory: normal effort BS clear bilaterally no wheeze  Abdomen: soft +BS non-tender non-distended  Musculoskeletal:  3rd toe right foot less erythema. Continues with edema. No tenderness  Data Reviewed: Basic Metabolic Panel:  Recent Labs Lab 06/12/14 1346 06/13/14 0627 06/14/14 0559 06/15/14 0646  NA 138 137 137 138  K 4.3 4.6 4.5 4.3  CL 99 99 98 102  CO2 30 29 30 28   GLUCOSE 220* 187* 300* 237*  BUN 16 11 12 12   CREATININE 0.88 0.89 0.92 0.94  CALCIUM 9.5 8.9 8.8 8.8   Liver Function Tests:  Recent Labs Lab 06/12/14 1346  AST 13   ALT 18  ALKPHOS 173*  BILITOT 0.9  PROT 7.4  ALBUMIN 3.7   No results found for this basename: LIPASE, AMYLASE,  in the last 168 hours No results found for this basename: AMMONIA,  in the last 168 hours CBC:  Recent Labs Lab 06/12/14 1346 06/13/14 0627  WBC 6.3 4.8  NEUTROABS 3.6  --   HGB 16.2 15.8  HCT 45.2 45.2  MCV 89.3 89.3  PLT 179 163   Cardiac Enzymes: No results found for this basename: CKTOTAL, CKMB, CKMBINDEX, TROPONINI,  in the last 168 hours BNP (last 3 results) No results found for this basename: PROBNP,  in the last 8760 hours CBG:  Recent Labs Lab 06/15/14 0819 06/15/14 1155 06/15/14 1712 06/15/14 2125 06/16/14 0728  GLUCAP 203* 174* 163* 137* 208*    Recent Results (from the past 240 hour(s))  AEROBIC CULTURE     Status: Abnormal   Collection Time    06/12/14 10:24 AM      Result Value Ref Range Status   Aerobic Bacterial Culture Final report (*)  Final   Result 1 Staphylococcus aureus (*)  Final   Comment: Heavy growth     Based on resistance to penicillin and susceptibility to oxacillin     this isolate would be susceptible to:     * Penicillinase-stable penicillins; such as:         Cloxacillin         Dicloxacillin         Nafcillin     *  Beta-lactam/beta-lactamase inhibitor combinations; such as:         Amoxicillin-clavulanic acid         Ampicillin-sulbactam     * Antistaphylococcal cephems; such as:         Cefaclor         Cefuroxime     * Antistaphylococcal carbapenems; such as:         Imipenem         Meropenem   Result 2 Comment (*)  Final   Comment: Beta hemolytic Streptococcus, group B     Moderate growth     Penicillin and ampicillin are drugs of choice for treatment of     beta-hemolytic streptococcal infections. Susceptibility testing of     penicillins and other beta-lactam agents approved by the FDA for     treatment of beta-hemolytic streptococcal infections need not be     performed routinely because  nonsusceptible isolates are extremely     rare in any beta-hemolytic streptococcus and have not been reported     for Streptococcus pyogenes (group A). (CLSI 2011)   Result 3 Routine flora   Final   Comment: Heavy growth   ANTIMICROBIAL SUSCEPTIBILITY Comment   Final   Comment:       ** S = Susceptible; I = Intermediate; R = Resistant **                        P = Positive; N = Negative                 MICS are expressed in micrograms per mL        Antibiotic                 RSLT#1    RSLT#2    RSLT#3    RSLT#4     Ciprofloxacin                  R     Clindamycin                    S     Erythromycin                   S     Gentamicin                     S     Levofloxacin                   I     Linezolid                      S     Moxifloxacin                   R     Oxacillin                      S     Penicillin                     R     Quinupristin/Dalfopristin      S     Rifampin                       S     Tetracycline  S     Trimethoprim/Sulfa             S     Vancomycin                     S  CULTURE, BLOOD (ROUTINE X 2)     Status: None   Collection Time    06/12/14  2:36 PM      Result Value Ref Range Status   Specimen Description LEFT ANTECUBITAL   Final   Special Requests BOTTLES DRAWN AEROBIC AND ANAEROBIC 10CC   Final   Culture NO GROWTH 3 DAYS   Final   Report Status PENDING   Incomplete  CULTURE, BLOOD (ROUTINE X 2)     Status: None   Collection Time    06/12/14  2:37 PM      Result Value Ref Range Status   Specimen Description BLOOD RIGHT ANTECUBITAL   Final   Special Requests BOTTLES DRAWN AEROBIC AND ANAEROBIC 12CC   Final   Culture  Setup Time     Final   Value: 06/14/2014 19:00     Performed at [facility]   Culture     Final   Value: GRAM POSITIVE COCCI IN CLUSTERS     Note: Gram Stain Report Called to,Read Back By and Verified With: M.WRIGHT AT 1907 ON 06/14/14 BY S.VANHORNE     Performed at [facility]   Report  Status PENDING   Incomplete     Studies: No results found.  Scheduled Meds: . gabapentin  300 mg Oral BID  . insulin aspart  0-20 Units Subcutaneous TID WC  . insulin aspart  0-5 Units Subcutaneous QHS  . insulin glargine  20 Units Subcutaneous QHS  . lisinopril  20 mg Oral Daily  . senna  1 tablet Oral BID  . vancomycin  1,250 mg Intravenous Q8H   Continuous Infusions:   Principal Problem:   Osteomyelitis of toe of right foot Active Problems:   Essential hypertension, benign   Type II or unspecified type diabetes mellitus with unspecified complication, uncontrolled   Neuropathy in diabetes   Osteomyelitis    Time spent: 35 minutes    [facility] M  [facility] Pager [phone]. If 7PM-7AM, please contact night-coverage at [email or web address], password [facility] 06/16/2014, 9:16 AM  LOS: 4 days

## 2014-06-16 NOTE — Progress Notes (Signed)
Podiatry Progress Note  History of Present Illness: [name] is a 48 y.o. male is being followed for osteomyelitis of his right third toe.  He denies any nausea, vomiting, fever or chills.  His parents and a neighbor are present at bedside.    Past Medical History  Diagnosis Date  . Boil     numerous boils in past  . Diabetes mellitus   . Hypertension   . Neuropathy associated with endocrine disorder    Scheduled Meds: . gabapentin  300 mg Oral BID  . insulin aspart  0-20 Units Subcutaneous TID WC  . insulin aspart  0-5 Units Subcutaneous QHS  . insulin aspart  3 Units Subcutaneous TID WC  . insulin glargine  20 Units Subcutaneous QHS  . lisinopril  20 mg Oral Daily  . senna  1 tablet Oral BID  . vancomycin  1,250 mg Intravenous Q8H   Continuous Infusions:  PRN Meds:.acetaminophen, acetaminophen, alum & mag hydroxide-simeth, HYDROcodone-acetaminophen, morphine injection, ondansetron (ZOFRAN) IV, ondansetron, traZODone  Allergies  Allergen Reactions  . Penicillins Other (See Comments)    Pt broke out in sweat when he was 14   Past Surgical History  Procedure Laterality Date  . Hernia repair     Family History  Problem Relation Age of Onset  . Stroke Father   . Diabetes Brother   . Stroke Brother   . Diabetes Mother    Social History:  reports that he has never smoked. He does not have any smokeless tobacco history on file. He reports that he does not drink alcohol or use illicit drugs.  Review of Systems: Constitutional:  No fever.  No chills. GI Symptoms:  No nausea.  No vomiting.  Physical Examination: Vital signs in last 24 hours:   Temp:  [98.2 F (36.8 C)-98.4 F (36.9 C)] 98.4 F (36.9 C) (08/11 0704) Pulse Rate:  [73-80] 80 (08/11 0704) Resp:  [16-20] 20 (08/11 0704) BP: (109-135)/(73-85) 109/74 mmHg (08/11 0704) SpO2:  [97 %-100 %] 97 % (08/11 0704)  The patient is a pleasant 48 year old male in no acute distress.  There are no changes in his  physical exam.  There is a full-thickness ulceration present along the distal tip of the right third toe is surrounding hyperkeratotic tissue present.  The wound bed is fibrotic.  The distal aspect of the right third toe is edematous and warm.  Dorsalis pedis pulse is palpable bilaterally.  Posterior tibial pulses palpable bilaterally.  There is collapse of the medial longitudinal arch of both feet.  Protective sensation is diminished bilaterally.  Lab/Test Results:    Recent Labs  06/14/14 0559 06/15/14 0646  NA 137 138  K 4.5 4.3  CL 98 102  CO2 30 28  BUN 12 12  CREATININE 0.92 0.94  GLUCOSE 300* 237*  CALCIUM 8.8 8.8    Recent Results (from the past 240 hour(s))  AEROBIC CULTURE     Status: Abnormal   Collection Time    06/12/14 10:24 AM      Result Value Ref Range Status   Aerobic Bacterial Culture Final report (*)  Final   Result 1 Staphylococcus aureus (*)  Final   Comment: Heavy growth     Based on resistance to penicillin and susceptibility to oxacillin     this isolate would be susceptible to:     * Penicillinase-stable penicillins; such as:         Cloxacillin         Dicloxacillin  Nafcillin     * Beta-lactam/beta-lactamase inhibitor combinations; such as:         Amoxicillin-clavulanic acid         Ampicillin-sulbactam     * Antistaphylococcal cephems; such as:         Cefaclor         Cefuroxime     * Antistaphylococcal carbapenems; such as:         Imipenem         Meropenem   Result 2 Comment (*)  Final   Comment: Beta hemolytic Streptococcus, group B     Moderate growth     Penicillin and ampicillin are drugs of choice for treatment of     beta-hemolytic streptococcal infections. Susceptibility testing of     penicillins and other beta-lactam agents approved by the FDA for     treatment of beta-hemolytic streptococcal infections need not be     performed routinely because nonsusceptible isolates are extremely     rare in any beta-hemolytic  streptococcus and have not been reported     for Streptococcus pyogenes (group A). (CLSI 2011)   Result 3 Routine flora   Final   Comment: Heavy growth   ANTIMICROBIAL SUSCEPTIBILITY Comment   Final   Comment:       ** S = Susceptible; I = Intermediate; R = Resistant **                        P = Positive; N = Negative                 MICS are expressed in micrograms per mL        Antibiotic                 RSLT#1    RSLT#2    RSLT#3    RSLT#4     Ciprofloxacin                  R     Clindamycin                    S     Erythromycin                   S     Gentamicin                     S     Levofloxacin                   I     Linezolid                      S     Moxifloxacin                   R     Oxacillin                      S     Penicillin                     R     Quinupristin/Dalfopristin      S     Rifampin                       S     Tetracycline  S     Trimethoprim/Sulfa             S     Vancomycin                     S  CULTURE, BLOOD (ROUTINE X 2)     Status: None   Collection Time    06/12/14  2:36 PM      Result Value Ref Range Status   Specimen Description BLOOD LEFT ANTECUBITAL   Final   Special Requests BOTTLES DRAWN AEROBIC AND ANAEROBIC 10CC   Final   Culture NO GROWTH 4 DAYS   Final   Report Status PENDING   Incomplete  CULTURE, BLOOD (ROUTINE X 2)     Status: None   Collection Time    06/12/14  2:37 PM      Result Value Ref Range Status   Specimen Description BLOOD RIGHT ANTECUBITAL   Final   Special Requests BOTTLES DRAWN AEROBIC AND ANAEROBIC 12CC   Final   Culture  Setup Time     Final   Value: 06/14/2014 19:00     Performed at [facility]   Culture     Final   Value: GRAM POSITIVE COCCI IN CLUSTERS     Note: Gram Stain Report Called to,Read Back By and Verified With: M.WRIGHT AT 1907 ON 06/14/14 BY S.VANHORNE     Performed at [facility]   Report Status PENDING   Incomplete     No results  found.  Assessment: Osteomyelitis of the third toe of the right foot secondary to ulceration from neuropathy attributed diabetes mellitus.  Plan: The benefits and risk of the surgical procedure were explained.  A consent will be obtained.  A partial amputation of the right third toe is planned for tomorrow at 11:20 AM.  He is NPO after midnight.  Nemesio Castrillon IVAN 06/16/2014, 1:08 PM

## 2014-06-16 NOTE — Progress Notes (Signed)
Patient seen and examined by me- please see note from Toya SmothersKaren Black.  For surgery in AM.  Continue IV abx for now.  Blood sugars still not controlled.  Meal coverage added- may need lantus increase  Marlin CanaryJessica Krysti Hickling DO

## 2014-06-17 ENCOUNTER — Inpatient Hospital Stay (HOSPITAL_COMMUNITY): Payer: 59

## 2014-06-17 ENCOUNTER — Encounter (HOSPITAL_COMMUNITY)
Admission: AD | Disposition: A | Discharge status: Home Health | Payer: Self-pay | Source: Ambulatory Visit | Attending: Family Medicine

## 2014-06-17 ENCOUNTER — Encounter (HOSPITAL_COMMUNITY): Payer: Self-pay

## 2014-06-17 ENCOUNTER — Encounter (HOSPITAL_COMMUNITY): Payer: 59 | Admitting: Anesthesiology

## 2014-06-17 ENCOUNTER — Inpatient Hospital Stay (HOSPITAL_COMMUNITY): Payer: 59 | Admitting: Anesthesiology

## 2014-06-17 HISTORY — PX: AMPUTATION: SHX166

## 2014-06-17 LAB — BASIC METABOLIC PANEL
Anion gap: 9 (ref 5–15)
BUN: 12 mg/dL (ref 6–23)
CALCIUM: 8.8 mg/dL (ref 8.4–10.5)
CHLORIDE: 101 meq/L (ref 96–112)
CO2: 28 meq/L (ref 19–32)
CREATININE: 0.97 mg/dL (ref 0.50–1.35)
GFR calc Af Amer: >90 mL/min (ref >90)
GFR calc non Af Amer: >90 mL/min (ref >90)
Glucose, Bld: 251 mg/dL — ABNORMAL HIGH (ref 70–99)
Potassium: 4.3 mEq/L (ref 3.7–5.3)
Sodium: 138 mEq/L (ref 137–147)

## 2014-06-17 LAB — GLUCOSE, CAPILLARY
GLUCOSE-CAPILLARY: 228 mg/dL — AB (ref 70–99)
GLUCOSE-CAPILLARY: 129 mg/dL — AB (ref 70–99)
Glucose-Capillary: 111 mg/dL — ABNORMAL HIGH (ref 70–99)
Glucose-Capillary: 138 mg/dL — ABNORMAL HIGH (ref 70–99)
Glucose-Capillary: 138 mg/dL — ABNORMAL HIGH (ref 70–99)

## 2014-06-17 LAB — CULTURE, BLOOD (ROUTINE X 2): Culture: NO GROWTH

## 2014-06-17 SURGERY — AMPUTATION DIGIT
Anesthesia: Monitor Anesthesia Care | Laterality: Right

## 2014-06-17 MED ORDER — CHLORHEXIDINE GLUCONATE CLOTH 2 % EX PADS: 6.0000 | MEDICATED_PAD | Freq: Every day | CUTANEOUS | Status: DC

## 2014-06-17 MED ORDER — MIDAZOLAM HCL 2 MG/2ML IJ SOLN
INTRAMUSCULAR | Status: AC
  Filled 2014-06-17: qty 2

## 2014-06-17 MED ORDER — FENTANYL CITRATE 0.05 MG/ML IJ SOLN
25.0000 ug | INTRAMUSCULAR | Status: AC
  Administered 2014-06-17 (×2): 25 ug via INTRAVENOUS

## 2014-06-17 MED ORDER — INSULIN STARTER KIT- PEN NEEDLES (ENGLISH)
1.0000 | Freq: Once | Status: AC
  Administered 2014-06-17: 1
  Filled 2014-06-17: qty 1

## 2014-06-17 MED ORDER — ONDANSETRON HCL 4 MG/2ML IJ SOLN
4.0000 mg | Freq: Once | INTRAMUSCULAR | Status: AC
  Administered 2014-06-17: 4 mg via INTRAVENOUS

## 2014-06-17 MED ORDER — INSULIN STARTER KIT- PEN NEEDLES (ENGLISH): 1.0000 | Freq: Once | Status: DC

## 2014-06-17 MED ORDER — LIDOCAINE HCL (CARDIAC) 10 MG/ML IV SOLN
INTRAVENOUS | Status: DC | PRN
  Administered 2014-06-17: 10 mg via INTRAVENOUS

## 2014-06-17 MED ORDER — ONDANSETRON HCL 4 MG/2ML IJ SOLN
INTRAMUSCULAR | Status: AC
  Filled 2014-06-17: qty 2

## 2014-06-17 MED ORDER — BUPIVACAINE HCL (PF) 0.5 % IJ SOLN
INTRAMUSCULAR | Status: AC
  Filled 2014-06-17: qty 30

## 2014-06-17 MED ORDER — BUPIVACAINE HCL (PF) 0.5 % IJ SOLN
INTRAMUSCULAR | Status: DC | PRN
  Administered 2014-06-17: 10 mL

## 2014-06-17 MED ORDER — INSULIN GLARGINE 100 UNIT/ML ~~LOC~~ SOLN
25.0000 [IU] | Freq: Every day | SUBCUTANEOUS | Status: DC
  Administered 2014-06-17: 25 [IU] via SUBCUTANEOUS
  Filled 2014-06-17 (×2): qty 0.25

## 2014-06-17 MED ORDER — FENTANYL CITRATE 0.05 MG/ML IJ SOLN: 25.0000 ug | INTRAMUSCULAR | Status: DC | PRN

## 2014-06-17 MED ORDER — FENTANYL CITRATE 0.05 MG/ML IJ SOLN
INTRAMUSCULAR | Status: DC | PRN
  Administered 2014-06-17 (×2): 50 ug via INTRAVENOUS

## 2014-06-17 MED ORDER — BACITRACIN-NEOMYCIN-POLYMYXIN OINTMENT TUBE
TOPICAL_OINTMENT | Freq: Two times a day (BID) | CUTANEOUS | Status: DC
  Administered 2014-06-17 – 2014-06-18 (×2): via TOPICAL
  Filled 2014-06-17: qty 15
  Filled 2014-06-17: qty 1

## 2014-06-17 MED ORDER — PROPOFOL INFUSION 10 MG/ML OPTIME
INTRAVENOUS | Status: DC | PRN
  Administered 2014-06-17: 200 ug/kg/min via INTRAVENOUS

## 2014-06-17 MED ORDER — LIDOCAINE HCL (PF) 1 % IJ SOLN
INTRAMUSCULAR | Status: AC
  Filled 2014-06-17: qty 5

## 2014-06-17 MED ORDER — FENTANYL CITRATE 0.05 MG/ML IJ SOLN
INTRAMUSCULAR | Status: AC
  Filled 2014-06-17: qty 2

## 2014-06-17 MED ORDER — PROPOFOL 10 MG/ML IV BOLUS
INTRAVENOUS | Status: AC
  Filled 2014-06-17: qty 20

## 2014-06-17 MED ORDER — MIDAZOLAM HCL 2 MG/2ML IJ SOLN
1.0000 mg | INTRAMUSCULAR | Status: DC | PRN
  Administered 2014-06-17: 2 mg via INTRAVENOUS

## 2014-06-17 MED ORDER — ONDANSETRON HCL 4 MG/2ML IJ SOLN: 4.0000 mg | Freq: Once | INTRAMUSCULAR | Status: DC | PRN

## 2014-06-17 MED ORDER — LACTATED RINGERS IV SOLN
INTRAVENOUS | Status: DC
  Administered 2014-06-17: 11:00:00 via INTRAVENOUS

## 2014-06-17 MED ORDER — INSULIN ASPART 100 UNIT/ML ~~LOC~~ SOLN
5.0000 [IU] | Freq: Three times a day (TID) | SUBCUTANEOUS | Status: DC
  Administered 2014-06-17 – 2014-06-18 (×4): 5 [IU] via SUBCUTANEOUS

## 2014-06-17 MED ORDER — PROPOFOL 10 MG/ML IV BOLUS
INTRAVENOUS | Status: DC | PRN
  Administered 2014-06-17 (×2): 10.2 mg via INTRAVENOUS

## 2014-06-17 MED ORDER — 0.9 % SODIUM CHLORIDE (POUR BTL) OPTIME
TOPICAL | Status: DC | PRN
  Administered 2014-06-17: 1000 mL

## 2014-06-17 MED ORDER — MUPIROCIN 2 % EX OINT
1.0000 "application " | TOPICAL_OINTMENT | Freq: Two times a day (BID) | CUTANEOUS | Status: DC
  Administered 2014-06-17 – 2014-06-18 (×2): 1 via NASAL

## 2014-06-17 SURGICAL SUPPLY — 39 items
BAG HAMPER (MISCELLANEOUS) ×2 IMPLANT
BANDAGE CONFORM 2  STR LF (GAUZE/BANDAGES/DRESSINGS) ×2 IMPLANT
BANDAGE ELASTIC 4 VELCRO NS (GAUZE/BANDAGES/DRESSINGS) ×2 IMPLANT
BANDAGE ESMARK 4X12 BL STRL LF (DISPOSABLE) ×1 IMPLANT
BANDAGE GAUZE ELAST BULKY 4 IN (GAUZE/BANDAGES/DRESSINGS) ×2 IMPLANT
BLADE SURG 15 STRL LF DISP TIS (BLADE) ×1 IMPLANT
BLADE SURG 15 STRL SS (BLADE) ×1
BNDG CONFORM 2 STRL LF (GAUZE/BANDAGES/DRESSINGS) ×2 IMPLANT
BNDG ESMARK 4X12 BLUE STRL LF (DISPOSABLE) ×2
BNDG GAUZE ELAST 4 BULKY (GAUZE/BANDAGES/DRESSINGS) ×2 IMPLANT
CLOTH BEACON ORANGE TIMEOUT ST (SAFETY) ×2 IMPLANT
COVER LIGHT HANDLE STERIS (MISCELLANEOUS) ×4 IMPLANT
CUFF TOURNIQUET SINGLE 18IN (TOURNIQUET CUFF) ×2 IMPLANT
DECANTER SPIKE VIAL GLASS SM (MISCELLANEOUS) ×2 IMPLANT
DRSG ADAPTIC 3X8 NADH LF (GAUZE/BANDAGES/DRESSINGS) ×2 IMPLANT
ELECT REM PT RETURN 9FT ADLT (ELECTROSURGICAL) ×2
ELECTRODE REM PT RTRN 9FT ADLT (ELECTROSURGICAL) ×1 IMPLANT
GAUZE SPONGE 4X4 12PLY STRL (GAUZE/BANDAGES/DRESSINGS) ×2 IMPLANT
GLOVE BIO SURGEON STRL SZ7.5 (GLOVE) ×2 IMPLANT
GLOVE BIOGEL PI IND STRL 7.0 (GLOVE) ×1 IMPLANT
GLOVE BIOGEL PI IND STRL 7.5 (GLOVE) ×2 IMPLANT
GLOVE BIOGEL PI INDICATOR 7.0 (GLOVE) ×1
GLOVE BIOGEL PI INDICATOR 7.5 (GLOVE) ×2
GOWN STRL REUS W/TWL LRG LVL3 (GOWN DISPOSABLE) ×2 IMPLANT
KIT ROOM TURNOVER APOR (KITS) ×2 IMPLANT
MANIFOLD NEPTUNE II (INSTRUMENTS) ×2 IMPLANT
NEEDLE HYPO 27GX1-1/4 (NEEDLE) ×4 IMPLANT
NS IRRIG 1000ML POUR BTL (IV SOLUTION) ×2 IMPLANT
PACK BASIC LIMB (CUSTOM PROCEDURE TRAY) ×2 IMPLANT
PAD ARMBOARD 7.5X6 YLW CONV (MISCELLANEOUS) ×2 IMPLANT
SET BASIN LINEN APH (SET/KITS/TRAYS/PACK) ×2 IMPLANT
SOL PREP PROV IODINE SCRUB 4OZ (MISCELLANEOUS) ×2 IMPLANT
SPONGE GAUZE 4X4 12PLY (GAUZE/BANDAGES/DRESSINGS) ×2 IMPLANT
SPONGE LAP 18X18 X RAY DECT (DISPOSABLE) ×2 IMPLANT
SUT ETHILON 4 0 PS 2 18 (SUTURE) IMPLANT
SUT PROLENE 4 0 PS 2 18 (SUTURE) ×2 IMPLANT
SUT VIC AB 4-0 PS2 27 (SUTURE) IMPLANT
SWAB CULTURE LIQ STUART DBL (MISCELLANEOUS) ×2 IMPLANT
SYRINGE CONTROL L 12CC (SYRINGE) ×4 IMPLANT

## 2014-06-17 NOTE — Progress Notes (Signed)
[facility] PROGRESS NOTE  TARANCE BALAN HYW:[id number] DOB: February 10, 1966 DOA: 06/12/2014 PCP: Redge Gainer, MD  Assessment/Plan:  1. Osteomyelitis right third toe with overlying cellulitis. One out of 2 positive blood culture. Likely contaminant. No systemic signs or symptoms of infection. Surgical screen + staph aureus. Negative MRSA Remains afebrile. Less erythema and swelling on 3rd right toe. Partial amputation today . Vancomycin day #6 per pharmacy. Hopefully will be able to transition to oral antibiotics after surgery. If not will need PICC.   2. 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 CBG range 168-228. Increase meal coverage and Lantus per diabetes coordinator. Will likely need insulin at discharge so will get starter kit and have him watch video and administer injection.  3. Essential hypertension. Controlled. Continue lisinopril.    Code Status: full Family Communication: none present Disposition Plan: home hopefully tomorrow   Consultants:  podiatry  Procedures:  Partial amputation right 3rd toe 06/17/14  Antibiotics:  Vancomycin 06/12/14>>>  HPI/Subjective: Awake alert "ready to get this done" denies pain/discomfort.   Objective: Filed Vitals:   06/17/14 0813  BP: 122/78  Pulse:   Temp:   Resp:     Intake/Output Summary (Last 24 hours) at 06/17/14 0932 Last data filed at 06/16/14 1855  Gross per 24 hour  Intake    600 ml  Output   1000 ml  Net   -400 ml   Filed Weights   06/12/14 1250  Weight: 102.059 kg (225 lb)    Exam:   General:  Ambulating in room with steady gait, appears calm and comfortable  Cardiovascular: RRR No m/g/r no LE edema  Respiratory: normal effort BS clear bilaterally no wheeze  Abdomen: soft non-distended +BS throughout non-tender  Musculoskeletal:  Right 3rd toe with swelling and erythema that seems to be improving. No tenderness.   Data Reviewed: Basic Metabolic  Panel:  Recent Labs Lab 06/12/14 1346 06/13/14 0627 06/14/14 0559 06/15/14 0646 06/17/14 0613  NA 138 137 137 138 138  K 4.3 4.6 4.5 4.3 4.3  CL 99 99 98 102 101  CO2 30 29 30 28 28   GLUCOSE 220* 187* 300* 237* 251*  BUN 16 11 12 12 12   CREATININE 0.88 0.89 0.92 0.94 0.97  CALCIUM 9.5 8.9 8.8 8.8 8.8   Liver Function Tests:  Recent Labs Lab 06/12/14 1346  AST 13  ALT 18  ALKPHOS 173*  BILITOT 0.9  PROT 7.4  ALBUMIN 3.7   No results found for this basename: LIPASE, AMYLASE,  in the last 168 hours No results found for this basename: AMMONIA,  in the last 168 hours CBC:  Recent Labs Lab 06/12/14 1346 06/13/14 0627  WBC 6.3 4.8  NEUTROABS 3.6  --   HGB 16.2 15.8  HCT 45.2 45.2  MCV 89.3 89.3  PLT 179 163   Cardiac Enzymes: No results found for this basename: CKTOTAL, CKMB, CKMBINDEX, TROPONINI,  in the last 168 hours BNP (last 3 results) No results found for this basename: PROBNP,  in the last 8760 hours CBG:  Recent Labs Lab 06/16/14 0728 06/16/14 1132 06/16/14 1649 06/16/14 2025 06/17/14 0732  GLUCAP 208* 213* 161* 168* 228*    Recent Results (from the past 240 hour(s))  AEROBIC CULTURE     Status: Abnormal   Collection Time    06/12/14 10:24 AM      Result Value Ref Range Status   Aerobic Bacterial Culture Final report (*)  Final   Result  1 Staphylococcus aureus (*)  Final   Comment: Heavy growth     Based on resistance to penicillin and susceptibility to oxacillin     this isolate would be susceptible to:     * Penicillinase-stable penicillins; such as:         Cloxacillin         Dicloxacillin         Nafcillin     * Beta-lactam/beta-lactamase inhibitor combinations; such as:         Amoxicillin-clavulanic acid         Ampicillin-sulbactam     * Antistaphylococcal cephems; such as:         Cefaclor         Cefuroxime     * Antistaphylococcal carbapenems; such as:         Imipenem         Meropenem   Result 2 Comment (*)  Final    Comment: Beta hemolytic Streptococcus, group B     Moderate growth     Penicillin and ampicillin are drugs of choice for treatment of     beta-hemolytic streptococcal infections. Susceptibility testing of     penicillins and other beta-lactam agents approved by the FDA for     treatment of beta-hemolytic streptococcal infections need not be     performed routinely because nonsusceptible isolates are extremely     rare in any beta-hemolytic streptococcus and have not been reported     for Streptococcus pyogenes (group A). (CLSI 2011)   Result 3 Routine flora   Final   Comment: Heavy growth   ANTIMICROBIAL SUSCEPTIBILITY Comment   Final   Comment:       ** S = Susceptible; I = Intermediate; R = Resistant **                        P = Positive; N = Negative                 MICS are expressed in micrograms per mL        Antibiotic                 RSLT#1    RSLT#2    RSLT#3    RSLT#4     Ciprofloxacin                  R     Clindamycin                    S     Erythromycin                   S     Gentamicin                     S     Levofloxacin                   I     Linezolid                      S     Moxifloxacin                   R     Oxacillin                      S     Penicillin  R     Quinupristin/Dalfopristin      S     Rifampin                       S     Tetracycline                   S     Trimethoprim/Sulfa             S     Vancomycin                     S  CULTURE, BLOOD (ROUTINE X 2)     Status: None   Collection Time    06/12/14  2:36 PM      Result Value Ref Range Status   Specimen Description BLOOD LEFT ANTECUBITAL   Final   Special Requests BOTTLES DRAWN AEROBIC AND ANAEROBIC 10CC   Final   Culture NO GROWTH 4 DAYS   Final   Report Status PENDING   Incomplete  CULTURE, BLOOD (ROUTINE X 2)     Status: None   Collection Time    06/12/14  2:37 PM      Result Value Ref Range Status   Specimen Description BLOOD RIGHT ANTECUBITAL   Final    Special Requests BOTTLES DRAWN AEROBIC AND ANAEROBIC 12CC   Final   Culture  Setup Time     Final   Value: 06/14/2014 19:00     Performed at [facility]   Culture     Final   Value: GRAM POSITIVE COCCI IN CLUSTERS     Note: Gram Stain Report Called to,Read Back By and Verified With: M.WRIGHT AT 1907 ON 06/14/14 BY S.VANHORNE     Performed at [facility]   Report Status PENDING   Incomplete  SURGICAL PCR SCREEN     Status: Abnormal   Collection Time    06/16/14  6:00 PM      Result Value Ref Range Status   MRSA, PCR NEGATIVE  NEGATIVE Final   Staphylococcus aureus POSITIVE (*) NEGATIVE Final   Comment:            The Xpert SA Assay (FDA     approved for NASAL specimens     in patients over 58 years of age),     is one component of     a comprehensive surveillance     program.  Test performance has     been validated by [employer] for patients greater     than or equal to 12 year old.     It is not intended     to diagnose infection nor to     guide or monitor treatment.     RESULT CALLED TO, READ BACK BY AND VERIFIED WITH:     WRIGHT A AT 2015 ON [id number] BY FORSYTH K     Studies: No results found.  Scheduled Meds: . Chlorhexidine Gluconate Cloth  6 each Topical Q0600  . gabapentin  300 mg Oral BID  . insulin aspart  0-20 Units Subcutaneous TID WC  . insulin aspart  0-5 Units Subcutaneous QHS  . insulin aspart  5 Units Subcutaneous TID WC  . insulin glargine  25 Units Subcutaneous QHS  . insulin starter kit- pen needles  1 kit Other Once  . insulin starter kit- pen needles  1 kit Other Once  . lisinopril  20 mg Oral Daily  . living well with diabetes book   Does not apply Once  . mupirocin ointment  1 application Nasal BID  . senna  1 tablet Oral BID  . vancomycin  1,250 mg Intravenous Q8H   Continuous Infusions:   Principal Problem:   Osteomyelitis of toe of right foot Active Problems:   Essential hypertension, benign   Type II or unspecified  type diabetes mellitus with unspecified complication, uncontrolled   Neuropathy in diabetes   Osteomyelitis    Time spent: 35 minutes    [facility] Pager [phone]. If 7PM-7AM, please contact night-coverage at [email or web address], password [facility] 06/17/2014, 9:32 AM  LOS: 5 days       Addendum I personally saw and evaluated patient, labs and vitals were reviewed. Patient having no complaints today, plan for partial amputation of third toe today. Will continue IV Vancomycin.

## 2014-06-17 NOTE — Progress Notes (Signed)
LATE ENTRY :   Patient started watching diabetic videos this evening after supper, including ones on self administration of insulin in the case that patient may need to be on insulin when discharged.  Patient verbalizes some previous knowledge on diabetes management regarding diet and PO medications.  Educated patient on foot care and instructed to assess feet daily to detect any sores/wounds early before infection develops.  Patient left to watch videos and to let staff know of any questions.  Diabetes consult already in place.

## 2014-06-17 NOTE — Transfer of Care (Signed)
Immediate Anesthesia Transfer of Care Note  Patient: [name]  Procedure(s) Performed: Procedure(s) (LRB): PARTIAL AMPUTATION RIGHT 3RD TOE (Right)  Patient Location: PACU  Anesthesia Type: MAC  Level of Consciousness: awake  Airway & Oxygen Therapy: Patient Spontanous Breathing. Nasal cannula  Post-op Assessment: Report given to PACU RN, Post -op Vital signs reviewed and stable and Patient moving all extremities  Post vital signs: Reviewed and stable  Complications: No apparent anesthesia complications

## 2014-06-17 NOTE — Progress Notes (Signed)
Inpatient Diabetes Program Recommendations  AACE/ADA: New Consensus Statement on Inpatient Glycemic Control (2013)  Target Ranges:  Prepandial:   less than 140 mg/dL      Peak postprandial:   less than 180 mg/dL (1-2 hours)      Critically ill patients:  140 - 180 mg/dL   Results for [name], [name] (MRN [id number]) as of 06/17/2014 08:05  Ref. Range 06/16/2014 07:28 06/16/2014 11:32 06/16/2014 16:49 06/16/2014 20:25 06/17/2014 07:32  Glucose-Capillary Latest Range: 70-99 mg/dL 027208 (H) 253213 (H) 664161 (H) 168 (H) 228 (H)   Diabetes history: Type 2 diabetes  Outpatient Diabetes medications: Glucotrol 5 mg BID, Metformin 1000 mg BID Current orders for Inpatient glycemic control: Lantus 20 units QHS, Novolog 0-20 units AC, Novolog 0-5 units HS, Novolog 3 units TID  Inpatient Diabetes Program Recommendations Insulin - Basal: Please consider increasing Lantus ot 25 units QHS. Insulin - Meal Coverage: Once diet is resumed, please consider increasing meal coverage to Novolog 5 units TID with meals.  Thanks, Orlando PennerMarie Niguel Moure, RN, MSN, CCRN Diabetes Coordinator Inpatient Diabetes Program [phone] (Team Pager) [phone] ([facility] office) [phone] [facility] [address]([facility] office)

## 2014-06-17 NOTE — Anesthesia Procedure Notes (Signed)
Procedure Name: MAC Date/Time: 06/17/2014 11:34 AM Performed by: Franco NonesYATES, Liese Dizdarevic S Pre-anesthesia Checklist: Patient identified, Emergency Drugs available, Suction available, Timeout performed and Patient being monitored Patient Re-evaluated:Patient Re-evaluated prior to inductionOxygen Delivery Method: Nasal Cannula    Procedure Name: MAC Date/Time: 06/17/2014 11:42 AM Performed by: Franco NonesYATES, Raef Sprigg S Pre-anesthesia Checklist: Patient identified, Emergency Drugs available, Suction available, Timeout performed and Patient being monitored Patient Re-evaluated:Patient Re-evaluated prior to inductionOxygen Delivery Method: Non-rebreather mask

## 2014-06-17 NOTE — Op Note (Signed)
OPERATIVE NOTE  DATE OF PROCEDURE:  06/17/2014  SURGEON:   Dallas SchimkeBenjamin Ivan Rees Santistevan, DPM  OR STAFF:   Circulator: Lennox Pippinsebra P Dallas, RN Scrub Person: Donald Poreeborah A Blackwell, CST   PREOPERATIVE DIAGNOSIS:   1.  Osteomyelitis of the distal phalanx of the third digit, right foot (ICD-9 730.17) 2.  Ulceration of the third digit, right foot (ICD-9 707.15) 3.  Diabetes mellitus with peripheral neuropathy (ICD-9 357.2)  POSTOPERATIVE DIAGNOSIS: Same  PROCEDURE: 1.  Partial amputation of the third digit, right foot (CPT 28825-T7)  ANESTHESIA:  Monitor Anesthesia Care   HEMOSTASIS:   Pneumatic ankle tourniquet set at 250 mmHg  ESTIMATED BLOOD LOSS:   Minimal (<5 cc)  MATERIALS USED:  None  INJECTABLES: Marcaine 0.5% plain  PATHOLOGY:   1.  Aerobic culture 2.  Anaerobic culture 3.  Distal aspect of the third toe, right foot  COMPLICATIONS:   None  INDICATIONS:  Ulceration of the distal aspect of the right third toe with radiographic findings consistent with osteomyelitis.  DESCRIPTION OF THE PROCEDURE:   The patient was brought to the operating room and placed on the operative table in the supine position.  A pneumatic tourniquet was applied to the patient's ankle.  Following sedation, the surgical site was anesthetized with 0.5% Marcaine plain.  The foot was then prepped, scrubbed, and draped in the usual sterile technique.  The foot was elevated, exsanguinated and the pneumatic ankle tourniquet inflated to 250 mmHg.    Attention was directed to the distal aspect of the right third toe.  An ulceration was noted along the distal aspect of the right third toe.  2 converging semielliptical incisions were performed encompassing the toe circumferentially.  Dissection was continued deep down to the level of the distal interphalangeal joint.  The distal interphalangeal joint was disarticulated.  The distal aspect of the toe was passed from the operative field and sent to pathology for  evaluation.  The head of the middle phalanx was evaluated and noted to be free of erosive changes.  Aerobic and anaerobic cultures were performed.  The surgical wound was irrigated with copious amounts of sterile irrigant.  The skin was reapproximated using 4-0 Prolene in a simple suture technique.    A sterile compressive dressing was applied to the right foot.  The pneumatic ankle tourniquet was deflated and a prompt hyperemic response was noted to all digits of the right foot.   The patient tolerated the procedure well.  The patient was then transferred to PACU with vital signs stable and vascular status intact to all toes of the operative foot.  Following a period of postoperative monitoring, the patient will be transferred back to his room.

## 2014-06-17 NOTE — Anesthesia Postprocedure Evaluation (Signed)
Anesthesia Post Note  Patient: [name]  Procedure(s) Performed: Procedure(s) (LRB): PARTIAL AMPUTATION RIGHT 3RD TOE (Right)  Anesthesia type: MAC  Patient location: PACU  Post pain: Pain level controlled  Post assessment: Post-op Vital signs reviewed, Patient's Cardiovascular Status Stable, Respiratory Function Stable, Patent Airway, No signs of Nausea or vomiting and Pain level controlled  Last Vitals:  Filed Vitals:   06/17/14 1218  BP:   Pulse:   Temp: 37.1 C  Resp:     Post vital signs: Reviewed and stable  Level of consciousness: awake and alert   Complications: No apparent anesthesia complications

## 2014-06-17 NOTE — Discharge Planning (Signed)
Pt educated on how to use insulin pen and shown how to watch diabetic videos.  Encouraged to ask questions as needed.  Pt stated he also has EMT and RN in family and will ask them as needed.

## 2014-06-17 NOTE — Anesthesia Preprocedure Evaluation (Signed)
Anesthesia Evaluation  Patient identified by MRN, date of birth, ID band Patient awake    Reviewed: Allergy & Precautions, H&P , NPO status , Patient's Chart, lab work & pertinent test results  Airway Mallampati: I TM Distance: >3 FB     Dental  (+) Teeth Intact   Pulmonary neg pulmonary ROS,  breath sounds clear to auscultation        Cardiovascular hypertension, Pt. on medications Rhythm:Regular Rate:Normal     Neuro/Psych    GI/Hepatic negative GI ROS,   Endo/Other  diabetes, Type 2, Oral Hypoglycemic Agents  Renal/GU      Musculoskeletal   Abdominal   Peds  Hematology   Anesthesia Other Findings   Reproductive/Obstetrics                           Anesthesia Physical Anesthesia Plan  ASA: III  Anesthesia Plan: MAC   Post-op Pain Management:    Induction: Intravenous  Airway Management Planned: Simple Face Mask  Additional Equipment:   Intra-op Plan:   Post-operative Plan:   Informed Consent: I have reviewed the patients History and Physical, chart, labs and discussed the procedure including the risks, benefits and alternatives for the proposed anesthesia with the patient or authorized representative who has indicated his/her understanding and acceptance.     Plan Discussed with:   Anesthesia Plan Comments:         Anesthesia Quick Evaluation

## 2014-06-18 ENCOUNTER — Telehealth: Payer: Self-pay | Admitting: Family Medicine

## 2014-06-18 DIAGNOSIS — T148XXA Other injury of unspecified body region, initial encounter: Secondary | ICD-10-CM | POA: Diagnosis present

## 2014-06-18 LAB — BASIC METABOLIC PANEL
ANION GAP: 9 (ref 5–15)
BUN: 13 mg/dL (ref 6–23)
CO2: 31 mEq/L (ref 19–32)
Calcium: 9.4 mg/dL (ref 8.4–10.5)
Chloride: 98 mEq/L (ref 96–112)
Creatinine, Ser: 1.02 mg/dL (ref 0.50–1.35)
GFR calc Af Amer: >90 mL/min (ref >90)
GFR calc non Af Amer: 85 mL/min — ABNORMAL LOW (ref >90)
Glucose, Bld: 139 mg/dL — ABNORMAL HIGH (ref 70–99)
Potassium: 4.7 mEq/L (ref 3.7–5.3)
Sodium: 138 mEq/L (ref 137–147)

## 2014-06-18 LAB — CBC
HCT: 48.7 % (ref 39.0–52.0)
Hemoglobin: 17.5 g/dL — ABNORMAL HIGH (ref 13.0–17.0)
MCH: 32.2 pg (ref 26.0–34.0)
MCHC: 35.9 g/dL (ref 30.0–36.0)
MCV: 89.5 fL (ref 78.0–100.0)
PLATELETS: 209 10*3/uL (ref 150–400)
RBC: 5.44 MIL/uL (ref 4.22–5.81)
RDW: 12.1 % (ref 11.5–15.5)
WBC: 6.8 10*3/uL (ref 4.0–10.5)

## 2014-06-18 LAB — GLUCOSE, CAPILLARY
GLUCOSE-CAPILLARY: 135 mg/dL — AB (ref 70–99)
Glucose-Capillary: 214 mg/dL — ABNORMAL HIGH (ref 70–99)

## 2014-06-18 MED ORDER — INSULIN GLARGINE 100 UNIT/ML ~~LOC~~ SOLN: 25.0000 [IU] | Freq: Every day | SUBCUTANEOUS | Status: DC

## 2014-06-18 MED ORDER — CIPROFLOXACIN HCL 500 MG PO TABS: 500.0000 mg | ORAL_TABLET | Freq: Two times a day (BID) | ORAL | Status: DC

## 2014-06-18 MED ORDER — BACITRACIN-NEOMYCIN-POLYMYXIN OINTMENT TUBE: 1.0000 "application " | TOPICAL_OINTMENT | Freq: Two times a day (BID) | CUTANEOUS | Status: DC

## 2014-06-18 MED ORDER — CIPROFLOXACIN HCL 250 MG PO TABS
500.0000 mg | ORAL_TABLET | Freq: Two times a day (BID) | ORAL | Status: DC
  Administered 2014-06-18: 500 mg via ORAL
  Filled 2014-06-18: qty 2

## 2014-06-18 NOTE — Progress Notes (Signed)
Patient with orders to be discharge home. Discharge instructions given, patient verbalized understanding. Prescriptions given. Patient stable. Patient left in private vehicle with friend.  

## 2014-06-18 NOTE — Discharge Summary (Signed)
Physician Discharge Summary  Anthony RavensKeith B Tingler WUJ:[id number] DOB: Apr 02, 1966 DOA: 06/12/2014  PCP: Rudi HeapMOORE, DONALD, MD  Admit date: 06/12/2014 Discharge date: 06/18/2014  Time spent: 40 minutes  Recommendations for Outpatient Follow-up:  1. Follow up with Dr Nolen MuMcKinney 06/19/14 for dressing change and follow cultures 2. Follow up with PCP 1 week for evaluation of CBG readings for optimal diabetes control as insulin started  Discharge Diagnoses:  Principal Problem:   Osteomyelitis of toe of right foot Active Problems:   Essential hypertension, benign   Type II or unspecified type diabetes mellitus with unspecified complication, uncontrolled   Neuropathy in diabetes   Osteomyelitis   Splinter   Discharge Condition: stable at discharge  Diet recommendation: carb modified  Filed Weights   06/12/14 1250  Weight: 102.059 kg (225 lb)    History of present illness:  [name] is a very pleasant 48 y.o. male with past medical hx that includes DM, HTN, bilateral charcot joint ankles, neuropathy presented to room 327 on 06/12/14 with cc persistent foot wound. He reported had callus type wound on pad of 3rd toe on right foot. Day prior to admission noticed drainage. Pain intensified to point he had to leave work. He attributed this to the steel toe shoes as pain much better not wearing those shoes. He had chills, fever headache, nausea/vomiting/diarrhea. He went to PCP who obtained xray of foot that concerning for osteomyelitis. He was referred for direct admit.  He reported antibiotics were "called in" but he had not taken any. On admission VSS afebrile and not toxic appearing.   Hospital Course:  Assessment/Plan:  1. Osteomyelitis right third toe with overlying cellulitis. Admitted to medical bed. Evaluated by Dr Nolen MuMckinney podiatry. 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 No complaints of pain. One out of 2 positive blood culture. Likely contaminant. No systemic signs or symptoms of infection. He  received Vancomycin for 6 days. Will be discharged 10 days of cipro. Follow up with Dr Nolen MuMckinney 06/19/14 for dressing change. Dr Nolen Mumckinney will follow cultures and adjust antibiotics as indicated 2. Diabetes mellitus type 2, uncontrolled by hemoglobin A1c 9.7. Provided with education regarding diet, A1c, insulin administration and signs symptoms of hypoglycemia. Will discharge with Lantus. Has demonstrated ability to self inject. Follow up with PCP 1-2 weeks for evaluation of control  3. Essential hypertension. Controlled. Continue lisinopril.  4. Splinter: right thumb. Splinter removed at bedside. Area tender without erythema or drainage. Small amount swelling. Neosporin applied. Advised to monitor closely. On antibiotics that will cover. If fails to improve see PCP  Procedures:  Partial amputation of 3rd right toe.   Consultations:  Dr Nolen MuMcKinney  Discharge Exam: Ceasar MonsFiled Vitals:   06/18/14 0530  BP: 132/80  Pulse: 80  Temp: 98 F (36.7 C)  Resp: 20    General: well nourished appears comfortable  Cardiovascular: RRR No MGR No LE edema Respiratory: normal effort BS clear no wheeze Skin: dressing to right foot dry and intact  Discharge Instructions You were cared for by a hospitalist during your hospital stay. If you have any questions about your discharge medications or the care you received while you were in the hospital after you are discharged, you can call the unit and asked to speak with the hospitalist on call if the hospitalist that took care of you is not available. Once you are discharged, your primary care physician will handle any further medical issues. Please note that NO REFILLS for any discharge medications will be authorized once you are discharged,  as it is imperative that you return to your primary care physician (or establish a relationship with a primary care physician if you do not have one) for your aftercare needs so that they can reassess your need for medications and  monitor your lab values.      Discharge Instructions   Diet - low sodium heart healthy    Complete by:  As directed      Discharge instructions    Complete by:  As directed   Take medication as directed Follow up with Dr Nolen Mu as scheduled 06/19/14 Follow up with PCP 1-2 weeks for evaluation of daily CBG readings     Increase activity slowly    Complete by:  As directed             Medication List    STOP taking these medications       doxycycline 100 MG tablet  Commonly known as:  VIBRA-TABS     levofloxacin 750 MG tablet  Commonly known as:  LEVAQUIN      TAKE these medications       ciprofloxacin 500 MG tablet  Commonly known as:  CIPRO  Take 1 tablet (500 mg total) by mouth 2 (two) times daily.     gabapentin 300 MG capsule  Commonly known as:  NEURONTIN  Take 600 mg by mouth 2 (two) times daily.     glipiZIDE 5 MG tablet  Commonly known as:  GLUCOTROL  Take 1 tablet (5 mg total) by mouth 2 (two) times daily before a meal.     HYDROcodone-acetaminophen 10-325 MG per tablet  Commonly known as:  NORCO  Take 1 tablet by mouth every 6 (six) hours as needed.     ibuprofen 800 MG tablet  Commonly known as:  ADVIL,MOTRIN  Take 1 tablet (800 mg total) by mouth every 8 (eight) hours as needed for pain.     insulin glargine 100 UNIT/ML injection  Commonly known as:  LANTUS  Inject 0.25 mLs (25 Units total) into the skin at bedtime.     lisinopril 20 MG tablet  Commonly known as:  PRINIVIL,ZESTRIL  Take 1 tablet (20 mg total) by mouth daily.     meloxicam 15 MG tablet  Commonly known as:  MOBIC  1 TABLET ONCE A DAY ORALLY 30 DAY(S)     metFORMIN 1000 MG (MOD) 24 hr tablet  Commonly known as:  GLUMETZA  Take 1 tablet (1,000 mg total) by mouth 2 (two) times daily with a meal.     neomycin-bacitracin-polymyxin Oint  Commonly known as:  NEOSPORIN  Apply 1 application topically 2 (two) times daily.       Allergies  Allergen Reactions  . Penicillins  Other (See Comments)    Pt broke out in sweat when he was 14   Follow-up Information   Follow up with [name], DPM On 06/19/2014. (appointment at 11:45.pleas arrive at 11:30 for dressing change)    Specialty:  Podiatry   Contact information:   [address] [place] [address] [phone]       Follow up with [name], MD. Schedule an appointment as soon as possible for a visit in 1 week. (for evaluation of CBG readings and diabetes control)    Specialty:  Family Medicine   Contact information:   [address] [place] [address] [phone]        The results of significant diagnostics from this hospitalization (including imaging, microbiology, ancillary and laboratory)  are listed below for reference.    Significant Diagnostic Studies: Dg Foot Complete Right  04-Jul-2014   CLINICAL DATA:  Post op  EXAM: RIGHT FOOT COMPLETE - 3+ VIEW  COMPARISON:  06/12/2014  FINDINGS: Again noted erosive changes at the tip of distal phalanx third toe consistent with osteomyelitis. Stable mild cystic changes distal aspect proximal phalanx great toe. Extensive degenerative changes first tarsal metatarsal joint again noted. There is plantar spur of calcaneus. Dorsal spurring of navicular and talus. No acute fracture or subluxation.  IMPRESSION: No acute fracture or subluxation. Again noted erosive changes distal phalanx third toe consistent with osteomyelitis. Mild cystic changes distal aspect proximal phalanx great toe again noted. Extensive degenerative changes first tarsal metatarsal joint. Plantar spur of calcaneus. Dorsal spurring of navicular and talus.   Electronically Signed   By: Natasha Mead M.D.   On: 07-04-14 13:05   Dg Foot Complete Right  06/12/2014   CLINICAL DATA:  Third toe infection  EXAM: RIGHT FOOT COMPLETE - 3+ VIEW  COMPARISON:  None.  FINDINGS: No acute fracture or dislocation is noted. Erosive changes are noted in the third distal phalanx consistent with  osteomyelitis. Degenerative changes of the first tarsal metatarsal articulation are seen  IMPRESSION: Erosive changes in the third distal phalanx consistent with osteomyelitis.   Electronically Signed   By: Alcide Clever M.D.   On: 06/12/2014 10:28    Microbiology: Recent Results (from the past 240 hour(s))  AEROBIC CULTURE     Status: Abnormal   Collection Time    06/12/14 10:24 AM      Result Value Ref Range Status   Aerobic Bacterial Culture Final report (*)  Final   Result 1 Staphylococcus aureus (*)  Final   Comment: Heavy growth     Based on resistance to penicillin and susceptibility to oxacillin     this isolate would be susceptible to:     * Penicillinase-stable penicillins; such as:         Cloxacillin         Dicloxacillin         Nafcillin     * Beta-lactam/beta-lactamase inhibitor combinations; such as:         Amoxicillin-clavulanic acid         Ampicillin-sulbactam     * Antistaphylococcal cephems; such as:         Cefaclor         Cefuroxime     * Antistaphylococcal carbapenems; such as:         Imipenem         Meropenem   Result 2 Comment (*)  Final   Comment: Beta hemolytic Streptococcus, group B     Moderate growth     Penicillin and ampicillin are drugs of choice for treatment of     beta-hemolytic streptococcal infections. Susceptibility testing of     penicillins and other beta-lactam agents approved by the FDA for     treatment of beta-hemolytic streptococcal infections need not be     performed routinely because nonsusceptible isolates are extremely     rare in any beta-hemolytic streptococcus and have not been reported     for Streptococcus pyogenes (group A). (CLSI 2011)   Result 3 Routine flora   Final   Comment: Heavy growth   ANTIMICROBIAL SUSCEPTIBILITY Comment   Final   Comment:       ** S = Susceptible; I = Intermediate; R = Resistant **  P = Positive; N = Negative                 MICS are expressed in micrograms per mL         Antibiotic                 RSLT#1    RSLT#2    RSLT#3    RSLT#4     Ciprofloxacin                  R     Clindamycin                    S     Erythromycin                   S     Gentamicin                     S     Levofloxacin                   I     Linezolid                      S     Moxifloxacin                   R     Oxacillin                      S     Penicillin                     R     Quinupristin/Dalfopristin      S     Rifampin                       S     Tetracycline                   S     Trimethoprim/Sulfa             S     Vancomycin                     S  CULTURE, BLOOD (ROUTINE X 2)     Status: None   Collection Time    06/12/14  2:36 PM      Result Value Ref Range Status   Specimen Description BLOOD LEFT ANTECUBITAL   Final   Special Requests BOTTLES DRAWN AEROBIC AND ANAEROBIC 10CC   Final   Culture NO GROWTH 5 DAYS   Final   Report Status 06/17/2014 FINAL   Final  CULTURE, BLOOD (ROUTINE X 2)     Status: None   Collection Time    06/12/14  2:37 PM      Result Value Ref Range Status   Specimen Description BLOOD RIGHT ANTECUBITAL   Final   Special Requests BOTTLES DRAWN AEROBIC AND ANAEROBIC 12CC   Final   Culture  Setup Time     Final   Value: 06/14/2014 19:00     Performed at [facility]   Culture     Final   Value: STAPHYLOCOCCUS SPECIES (COAGULASE NEGATIVE)     Note: THE SIGNIFICANCE OF ISOLATING THIS ORGANISM FROM A SINGLE VENIPUNCTURE CANNOT BE PREDICTED WITHOUT FURTHER CLINICAL AND CULTURE CORRELATION. SUSCEPTIBILITIES AVAILABLE ONLY ON REQUEST.  Note: Gram Stain Report Called to,Read Back By and Verified With: M.WRIGHT AT 1907 ON 06/14/14 BY S.VANHORNE     Performed at [facility]   Report Status 06/17/2014 FINAL   Final  SURGICAL PCR SCREEN     Status: Abnormal   Collection Time    06/16/14  6:00 PM      Result Value Ref Range Status   MRSA, PCR NEGATIVE  NEGATIVE Final   Staphylococcus aureus POSITIVE (*) NEGATIVE  Final   Comment:            The Xpert SA Assay (FDA     approved for NASAL specimens     in patients over 55 years of age),     is one component of     a comprehensive surveillance     program.  Test performance has     been validated by [facility] for patients greater     than or equal to 60 year old.     It is not intended     to diagnose infection nor to     guide or monitor treatment.     RESULT CALLED TO, READ BACK BY AND VERIFIED WITH:     WRIGHT A AT 2015 ON [id number] BY FORSYTH K  ANAEROBIC CULTURE     Status: None   Collection Time    06/17/14 11:56 AM      Result Value Ref Range Status   Specimen Description WOUND RIGHT 3RD TOE   Final   Special Requests VANCOMYCIN   Final   Gram Stain PENDING   Incomplete   Culture     Final   Value: NO ANAEROBES ISOLATED; CULTURE IN PROGRESS FOR 5 DAYS     Performed at [facility]   Report Status PENDING   Incomplete  WOUND CULTURE     Status: None   Collection Time    06/17/14 11:56 AM      Result Value Ref Range Status   Specimen Description WOUND RIGHT 3RD TOE   Final   Special Requests VANCOMYCIN   Final   Gram Stain PENDING   Incomplete   Culture     Final   Value: NO GROWTH 1 DAY     Performed at [facility]   Report Status PENDING   Incomplete     Labs: Basic Metabolic Panel:  Recent Labs Lab 06/13/14 0627 06/14/14 0559 06/15/14 0646 06/17/14 0613 06/18/14 0532  NA 137 137 138 138 138  K 4.6 4.5 4.3 4.3 4.7  CL 99 98 102 101 98  CO2 29 30 28 28 31   GLUCOSE 187* 300* 237* 251* 139*  BUN 11 12 12 12 13   CREATININE 0.89 0.92 0.94 0.97 1.02  CALCIUM 8.9 8.8 8.8 8.8 9.4   Liver Function Tests:  Recent Labs Lab 06/12/14 1346  AST 13  ALT 18  ALKPHOS 173*  BILITOT 0.9  PROT 7.4  ALBUMIN 3.7   No results found for this basename: LIPASE, AMYLASE,  in the last 168 hours No results found for this basename: AMMONIA,  in the last 168 hours CBC:  Recent Labs Lab 06/12/14 1346  06/13/14 0627 06/18/14 0532  WBC 6.3 4.8 6.8  NEUTROABS 3.6  --   --   HGB 16.2 15.8 17.5*  HCT 45.2 45.2 48.7  MCV 89.3 89.3 89.5  PLT 179 163 209   Cardiac Enzymes: No results found for this basename: CKTOTAL, CKMB, CKMBINDEX,  TROPONINI,  in the last 168 hours BNP: BNP (last 3 results) No results found for this basename: PROBNP,  in the last 8760 hours CBG:  Recent Labs Lab 06/17/14 1248 06/17/14 1620 06/17/14 2046 06/18/14 0738 06/18/14 1116  GLUCAP 138* 129* 138* 135* 214*       Signed:  BLACK,KAREN M  [facility] 06/18/2014, 12:13 PM    Addendum I personally examined patient on day of discharge. Dr Nolen Mu recommending discharge on 10 days of Cipro and will follow up on OR cultures from partial amputation of the third digit. Patient otherwise remained stable, afebrile, tolerating PO intake.  He was discharged home in stable condition on 06/18/2014.

## 2014-06-18 NOTE — Progress Notes (Signed)
UR review complete.  

## 2014-06-18 NOTE — Progress Notes (Addendum)
ANTIBIOTIC CONSULT NOTE   Pharmacy Consult for Vancomycin Indication: Osteomyelitis  Allergies  Allergen Reactions  . Penicillins Other (See Comments)    Pt broke out in sweat when he was 48    Patient Measurements: Height: 6\' 4"  (193 cm) Weight: 225 lb (102.059 kg) IBW/kg (Calculated) : 86.8  Vital Signs: Temp: 98 F (36.7 C) (08/13 0530) Temp src: Oral (08/13 0530) BP: 132/80 mmHg (08/13 0530) Pulse Rate: 80 (08/13 0530) Intake/Output from previous day: 08/12 0701 - 08/13 0700 In: 690 [P.O.:240; I.V.:450] Out: 300 [Urine:300] Intake/Output from this shift:    Labs:  Recent Labs  06/17/14 0613 06/18/14 0532  WBC  --  6.8  HGB  --  17.5*  PLT  --  209  CREATININE 0.97 1.02   Estimated Creatinine Clearance: 107.6 ml/min (by C-G formula based on Cr of 1.02). No results found for this basename: VANCOTROUGH, Leodis Binet, VANCORANDOM, GENTTROUGH, GENTPEAK, GENTRANDOM, TOBRATROUGH, TOBRAPEAK, TOBRARND, AMIKACINPEAK, AMIKACINTROU, AMIKACIN,  in the last 72 hours   Microbiology: Recent Results (from the past 720 hour(s))  AEROBIC CULTURE     Status: Abnormal   Collection Time    06/12/14 10:24 AM      Result Value Ref Range Status   Aerobic Bacterial Culture Final report (*)  Final   Result 1 Staphylococcus aureus (*)  Final   Comment: Heavy growth     Based on resistance to penicillin and susceptibility to oxacillin     this isolate would be susceptible to:     * Penicillinase-stable penicillins; such as:         Cloxacillin         Dicloxacillin         Nafcillin     * Beta-lactam/beta-lactamase inhibitor combinations; such as:         Amoxicillin-clavulanic acid         Ampicillin-sulbactam     * Antistaphylococcal cephems; such as:         Cefaclor         Cefuroxime     * Antistaphylococcal carbapenems; such as:         Imipenem         Meropenem   Result 2 Comment (*)  Final   Comment: Beta hemolytic Streptococcus, group B     Moderate growth   Penicillin and ampicillin are drugs of choice for treatment of     beta-hemolytic streptococcal infections. Susceptibility testing of     penicillins and other beta-lactam agents approved by the FDA for     treatment of beta-hemolytic streptococcal infections need not be     performed routinely because nonsusceptible isolates are extremely     rare in any beta-hemolytic streptococcus and have not been reported     for Streptococcus pyogenes (group A). (CLSI 2011)   Result 3 Routine flora   Final   Comment: Heavy growth   ANTIMICROBIAL SUSCEPTIBILITY Comment   Final   Comment:       ** S = Susceptible; I = Intermediate; R = Resistant **                        P = Positive; N = Negative                 MICS are expressed in micrograms per mL        Antibiotic  RSLT#1    RSLT#2    RSLT#3    RSLT#4     Ciprofloxacin                  R     Clindamycin                    S     Erythromycin                   S     Gentamicin                     S     Levofloxacin                   I     Linezolid                      S     Moxifloxacin                   R     Oxacillin                      S     Penicillin                     R     Quinupristin/Dalfopristin      S     Rifampin                       S     Tetracycline                   S     Trimethoprim/Sulfa             S     Vancomycin                     S  CULTURE, BLOOD (ROUTINE X 2)     Status: None   Collection Time    06/12/14  2:36 PM      Result Value Ref Range Status   Specimen Description BLOOD LEFT ANTECUBITAL   Final   Special Requests BOTTLES DRAWN AEROBIC AND ANAEROBIC 10CC   Final   Culture NO GROWTH 5 DAYS   Final   Report Status 06/17/2014 FINAL   Final  CULTURE, BLOOD (ROUTINE X 2)     Status: None   Collection Time    06/12/14  2:37 PM      Result Value Ref Range Status   Specimen Description BLOOD RIGHT ANTECUBITAL   Final   Special Requests BOTTLES DRAWN AEROBIC AND ANAEROBIC 12CC   Final    Culture  Setup Time     Final   Value: 06/14/2014 19:00     Performed at [facility]   Culture     Final   Value: STAPHYLOCOCCUS SPECIES (COAGULASE NEGATIVE)     Note: THE SIGNIFICANCE OF ISOLATING THIS ORGANISM FROM A SINGLE VENIPUNCTURE CANNOT BE PREDICTED WITHOUT FURTHER CLINICAL AND CULTURE CORRELATION. SUSCEPTIBILITIES AVAILABLE ONLY ON REQUEST.     Note: Gram Stain Report Called to,Read Back By and Verified With: M.WRIGHT AT 1907 ON 06/14/14 BY S.VANHORNE     Performed at [facility]   Report Status 06/17/2014 FINAL   Final  SURGICAL PCR SCREEN     Status: Abnormal   Collection  Time    06/16/14  6:00 PM      Result Value Ref Range Status   MRSA, PCR NEGATIVE  NEGATIVE Final   Staphylococcus aureus POSITIVE (*) NEGATIVE Final   Comment:            The Xpert SA Assay (FDA     approved for NASAL specimens     in patients over 50 years of age),     is one component of     a comprehensive surveillance     program.  Test performance has     been validated by [facility] for patients greater     than or equal to 50 year old.     It is not intended     to diagnose infection nor to     guide or monitor treatment.     RESULT CALLED TO, READ BACK BY AND VERIFIED WITH:     WRIGHT A AT 2015 ON [id number] BY FORSYTH K  WOUND CULTURE     Status: None   Collection Time    06/17/14 11:56 AM      Result Value Ref Range Status   Specimen Description WOUND RIGHT 3RD TOE   Final   Special Requests VANCOMYCIN   Final   Gram Stain PENDING   Incomplete   Culture     Final   Value: NO GROWTH 1 DAY     Performed at [facility]   Report Status PENDING   Incomplete    Medical History: Past Medical History  Diagnosis Date  . Boil     numerous boils in past  . Diabetes mellitus   . Hypertension   . Neuropathy associated with endocrine disorder     Medications:  Scheduled:  . Chlorhexidine Gluconate Cloth  6 each Topical Q0600  . gabapentin  300 mg Oral BID  .  insulin aspart  0-20 Units Subcutaneous TID WC  . insulin aspart  0-5 Units Subcutaneous QHS  . insulin aspart  5 Units Subcutaneous TID WC  . insulin glargine  25 Units Subcutaneous QHS  . lisinopril  20 mg Oral Daily  . living well with diabetes book   Does not apply Once  . mupirocin ointment  1 application Nasal BID  . neomycin-bacitracin-polymyxin   Topical BID  . senna  1 tablet Oral BID  . vancomycin  1,250 mg Intravenous Q8H   Assessment: 48 yo M admitted with wound on 3rd right toe.  Xray concerning for osteo.   He is s/p partial ray amputation on 06/17/14.  Hx MRSA.   He is afebrile with normal WBC.   Wound cx pending. 1/2 Blood cx +CNS-->likely contaminant. Renal function remains stable.  Vancomycin dose was increased on 06/14/14.  Vancomycin 8/7>>  Goal of Therapy:  Vancomycin trough level 15-20 mcg/ml  Plan:  Vancomycin to 1250 mg IV every 8 hours Weekly Vancomycin trough while on Vancomycin  Monitor renal function & cx data Duration of therapy per MD--->  If all infected bone removed would recommend IV Vancomycin x 24-48 hrs post-op followed by 2 weeks of Bactrim DS 2 tabs BID + Rifampin 600mg  daily.  Recommendation discussed with Dr Luciana Axeomer [facility]  If plan for IV Vanc after discharge would change dose to 2gm IV q12h & monitor trough.    Elson ClanLilliston, Zayden Maffei Michelle 06/18/2014,8:49 AM

## 2014-06-19 ENCOUNTER — Encounter (HOSPITAL_COMMUNITY): Payer: Self-pay | Admitting: Podiatry

## 2014-06-19 LAB — WOUND CULTURE
Culture: NO GROWTH
Gram Stain: NONE SEEN

## 2014-06-19 NOTE — Telephone Encounter (Signed)
Patient aware.

## 2014-06-19 NOTE — Telephone Encounter (Signed)
If you are geeting regular insulin syringes you do not need rx.

## 2014-06-22 LAB — ANAEROBIC CULTURE: Gram Stain: NONE SEEN

## 2014-06-29 ENCOUNTER — Ambulatory Visit (INDEPENDENT_AMBULATORY_CARE_PROVIDER_SITE_OTHER): Payer: 59 | Admitting: Family Medicine

## 2014-06-29 ENCOUNTER — Encounter: Payer: Self-pay | Admitting: Family Medicine

## 2014-06-29 VITALS — BP 139/82 | HR 81 | Temp 99.6°F | Ht 74.0 in | Wt 225.0 lb

## 2014-06-29 DIAGNOSIS — E1169 Type 2 diabetes mellitus with other specified complication: Secondary | ICD-10-CM

## 2014-06-29 DIAGNOSIS — M869 Osteomyelitis, unspecified: Secondary | ICD-10-CM

## 2014-06-29 DIAGNOSIS — E1059 Type 1 diabetes mellitus with other circulatory complications: Secondary | ICD-10-CM

## 2014-06-29 DIAGNOSIS — M9080 Osteopathy in diseases classified elsewhere, unspecified site: Secondary | ICD-10-CM

## 2014-06-29 MED ORDER — INSULIN SYRINGES (DISPOSABLE) U-100 0.3 ML MISC: 1.0000 | Status: DC

## 2014-06-29 MED ORDER — CANAGLIFLOZIN 300 MG PO TABS: 300.0000 mg | ORAL_TABLET | ORAL | Status: DC

## 2014-06-29 NOTE — Progress Notes (Signed)
   Subjective:    Patient ID: [name], male    DOB: 02-12-1966, 48 y.o.   MRN: [id number]  HPI  This 48 y.o. male presents for evaluation of follow up on right toe cellulitis and osteomyelitis. He has been having problems with diabetes control.  He is currently on metformin, glipizide, and now is on lantus insulin.  He states he wants off the lantus because he is deathly afraid of needles.  He Has been taking 25 units sq qd and states he has his sister administer the lantus and cannot watch because of fear of needles.  He reports having fasting glucose of 100 and postprandial blood sugars at or around 200.  He is seeing Podiatry for his toe.  His last hgbaic in 5/15 was 10.1.  Review of Systems C/o right toe amputation from osteomyleoliits.   No chest pain, SOB, HA, dizziness, vision change, N/V, diarrhea, constipation, dysuria, urinary urgency or frequency, myalgias, arthralgias or rash.  Objective:   Physical Exam Vital signs noted  Well developed well nourished male.  HEENT - Head atraumatic Normocephalic                Eyes - PERRLA, Conjuctiva - clear Sclera- Clear EOMI                Ears - EAC's Wnl TM's Wnl Gross Hearing WNL                Throat - oropharanx wnl Respiratory - Lungs CTA bilateral Cardiac - RRR S1 and S2 without murmur GI - Abdomen soft Nontender and bowel sounds active x 4 Extremities - right 3rd toe in dressing and right foot in boot       Assessment & Plan:  Type I (juvenile type) diabetes mellitus with peripheral circulatory disorders, not stated as uncontrolled(250.71) - Plan: Canagliflozin (INVOKANA) 300 MG TABS, Insulin Syringes, Disposable, U-100 0.3 ML MISC.  #28 samples of invokana given.  Diabetic osteomyelitis - Continue to follow up with podiatry.  Discussed with patient that we need to get his diabetes under control and for now he will need to take the lantus and will see if we can cut back.  Discussed if fsbs drops below 70 then cut  lantus dose in half.    Follow up in one week.  [name] FNP

## 2014-07-03 ENCOUNTER — Other Ambulatory Visit: Payer: Self-pay | Admitting: Family Medicine

## 2014-07-06 ENCOUNTER — Telehealth: Payer: Self-pay | Admitting: Family Medicine

## 2014-07-06 ENCOUNTER — Ambulatory Visit: Payer: 59 | Admitting: Family Medicine

## 2014-07-06 NOTE — Telephone Encounter (Signed)
Appt moved to 8:00 am on 07/08/14.  Patient aware.

## 2014-07-08 ENCOUNTER — Encounter: Payer: Self-pay | Admitting: Family Medicine

## 2014-07-08 ENCOUNTER — Ambulatory Visit (INDEPENDENT_AMBULATORY_CARE_PROVIDER_SITE_OTHER): Payer: 59 | Admitting: Family Medicine

## 2014-07-08 VITALS — BP 142/89 | HR 86 | Temp 97.3°F | Ht 74.0 in | Wt 224.0 lb

## 2014-07-08 DIAGNOSIS — M129 Arthropathy, unspecified: Secondary | ICD-10-CM

## 2014-07-08 DIAGNOSIS — E1149 Type 2 diabetes mellitus with other diabetic neurological complication: Secondary | ICD-10-CM

## 2014-07-08 DIAGNOSIS — M1990 Unspecified osteoarthritis, unspecified site: Secondary | ICD-10-CM

## 2014-07-08 MED ORDER — DICLOFENAC SODIUM 1 % TD GEL: 2.0000 g | Freq: Four times a day (QID) | TRANSDERMAL | Status: DC

## 2014-07-08 NOTE — Progress Notes (Signed)
   Subjective:    Patient ID: [name], male    DOB: Mar 30, 1966, 48 y.o.   MRN: [id number]  HPI  This 48 y.o. male presents for evaluation of follow up on diabetes and bilateral foot pain.  He has hx of peripheral neuropathy and charcot joint.  He is having some discomfort in his left ankle.  He takes mobic  po qd and he takes gabapentin and hydrocodone.  He is seeing podiatry.  He reports that his blood sugars have improved and run 130's in the am fasting.  Review of Systems C/o pain in ankles and feet   No chest pain, SOB, HA, dizziness, vision change, N/V, diarrhea, constipation, dysuria, urinary urgency or frequency, myalgias, arthralgias or rash.  Objective:   Physical Exam  Vital signs noted  Well developed well nourished male.  HEENT - Head atraumatic Normocephalic                Eyes - PERRLA, Conjuctiva - clear Sclera- Clear EOMI                Ears - EAC's Wnl TM's Wnl Gross Hearing WNL                Nose - Nares patent                 Throat - oropharanx wnl Respiratory - Lungs CTA bilateral Cardiac - RRR S1 and S2 without murmur MS - Bilateral feet with flat arches and deformities in ankle consistent with charcot joints     Assessment & Plan:  Arthritis - Plan: diclofenac sodium (VOLTAREN) 1 % GEL and mobic and hydrocodone.                  ACE wrap left ankle Diabetes - continue with invokana  Charcot joints - Continue to follow up with Podiatry  Neuropathy - Continue Gabapentin  Follow up in 2 weeks  Note for work  [name] FNP

## 2014-07-27 ENCOUNTER — Other Ambulatory Visit: Payer: Self-pay | Admitting: Pharmacist

## 2014-07-28 ENCOUNTER — Ambulatory Visit (INDEPENDENT_AMBULATORY_CARE_PROVIDER_SITE_OTHER): Payer: 59 | Admitting: Family Medicine

## 2014-07-28 ENCOUNTER — Encounter: Payer: Self-pay | Admitting: Family Medicine

## 2014-07-28 ENCOUNTER — Ambulatory Visit (INDEPENDENT_AMBULATORY_CARE_PROVIDER_SITE_OTHER): Payer: 59

## 2014-07-28 ENCOUNTER — Ambulatory Visit: Payer: 59 | Admitting: Family Medicine

## 2014-07-28 VITALS — BP 153/96 | HR 91 | Temp 97.3°F | Ht 74.0 in | Wt 230.0 lb

## 2014-07-28 DIAGNOSIS — IMO0001 Reserved for inherently not codable concepts without codable children: Secondary | ICD-10-CM

## 2014-07-28 DIAGNOSIS — S99921S Unspecified injury of right foot, sequela: Secondary | ICD-10-CM

## 2014-07-28 NOTE — Progress Notes (Signed)
   Subjective:    Patient ID: [name], male    DOB: July 23, 1966, 48 y.o.   MRN: [id number]  HPI Patient is here for right second toe injury.     Review of Systems No chest pain, SOB, HA, dizziness, vision change, N/V, diarrhea, constipation, dysuria, urinary urgency or frequency, myalgias, arthralgias or rash.     Objective:   Physical Exam Vital signs noted  Well developed well nourished male.  HEENT - Head atraumatic Normocephalic Respiratory - Lungs CTA bilateral Cardiac - RRR S1 and S2 without murmur Right second toe - tip of toe ecchymotic no ulcer. Bruising right second toe.  Xray right second toe - No fx    Assessment & Plan:  Toe injury, right, sequela - Plan: DG Foot Complete Right  [name] FNP

## 2014-08-31 ENCOUNTER — Ambulatory Visit (INDEPENDENT_AMBULATORY_CARE_PROVIDER_SITE_OTHER): Payer: 59 | Admitting: Family Medicine

## 2014-08-31 ENCOUNTER — Encounter: Payer: Self-pay | Admitting: Family Medicine

## 2014-08-31 ENCOUNTER — Ambulatory Visit (INDEPENDENT_AMBULATORY_CARE_PROVIDER_SITE_OTHER): Payer: 59

## 2014-08-31 VITALS — BP 121/78 | HR 97 | Temp 97.1°F | Ht 74.0 in | Wt 230.0 lb

## 2014-08-31 DIAGNOSIS — L97519 Non-pressure chronic ulcer of other part of right foot with unspecified severity: Secondary | ICD-10-CM

## 2014-08-31 MED ORDER — SULFAMETHOXAZOLE-TMP DS 800-160 MG PO TABS: 1.0000 | ORAL_TABLET | Freq: Two times a day (BID) | ORAL | Status: DC

## 2014-08-31 NOTE — Progress Notes (Signed)
   Subjective:    Patient ID: [name], male    DOB: 1966/05/26, 48 y.o.   MRN: [id number]  HPI C/o right second toe pain and ulcer.  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  He recently had osteomyelitis and had to have the tip of his third toe amputated.  He denies any pain in his right second toe.  Review of Systems  Constitutional: Negative for fever.  HENT: Negative for ear pain.   Eyes: Negative for discharge.  Respiratory: Negative for cough.   Cardiovascular: Negative for chest pain.  Gastrointestinal: Negative for abdominal distention.  Endocrine: Negative for polyuria.  Genitourinary: Negative for difficulty urinating.  Musculoskeletal: Negative for gait problem and neck pain.  Skin: Negative for color change and rash.  Neurological: Negative for speech difficulty and headaches.  Psychiatric/Behavioral: Negative for agitation.      xray right foot - Right second toe with possible osteomyelitis Prelimnary reading by Chrissie NoaWilliam Kerrie Timm,FNP Objective:    BP 121/78  Pulse 97  Temp(Src) 97.1 F (36.2 C) (Oral)  Ht 6\' 2"  (1.88 m)  Wt 230 lb (104.327 kg)  BMI 29.52 kg/m2 Physical Exam  Constitutional: He is oriented to person, place, and time. He appears well-developed and well-nourished.  HENT:  Head: Normocephalic and atraumatic.  Mouth/Throat: Oropharynx is clear and moist.  Eyes: Pupils are equal, round, and reactive to light.  Neck: Normal range of motion. Neck supple.  Cardiovascular: Normal rate and regular rhythm.   No murmur heard. Pulmonary/Chest: Effort normal and breath sounds normal.  Abdominal: Soft. Bowel sounds are normal. There is no tenderness.  Musculoskeletal:  Right second toe with large ulcer at bottom and tip of toe with necrotic tissue.  Neurological: He is alert and oriented to person, place, and time.  Skin: Skin is warm and dry.  Psychiatric: He has a normal mood and affect.           Assessment & Plan:     ICD-9-CM ICD-10-CM   1. Toe ulcer, right, with unspecified severity 707.15 L97.519 DG Foot Complete Right     Ambulatory referral to Wound Clinic     sulfamethoxazole-trimethoprim (BACTRIM DS) 800-160 MG per tablet   He has an appointment to see Dr. Nolen MuMcKinney for right second toe ulcer and discussed with him the xray findings.   Recommend be off of feet until seen by Dr. Nolen MuMckinney.  Return if symptoms worsen or fail to improve.  Deatra CanterWilliam J Mionna Advincula FNP

## 2014-09-01 ENCOUNTER — Encounter (HOSPITAL_COMMUNITY): Payer: Self-pay | Admitting: Pharmacy Technician

## 2014-09-01 ENCOUNTER — Encounter (HOSPITAL_COMMUNITY): Payer: Self-pay

## 2014-09-01 ENCOUNTER — Other Ambulatory Visit: Payer: Self-pay | Admitting: Podiatry

## 2014-09-01 ENCOUNTER — Encounter (HOSPITAL_COMMUNITY)
Admission: RE | Admit: 2014-09-01 | Discharge: 2014-09-01 | Disposition: A | Discharge status: Home / Self Care | Payer: 59 | Source: Ambulatory Visit | Attending: Podiatry | Admitting: Podiatry

## 2014-09-01 ENCOUNTER — Other Ambulatory Visit: Payer: Self-pay

## 2014-09-01 DIAGNOSIS — I10 Essential (primary) hypertension: Secondary | ICD-10-CM | POA: Diagnosis not present

## 2014-09-01 DIAGNOSIS — Z79899 Other long term (current) drug therapy: Secondary | ICD-10-CM | POA: Diagnosis not present

## 2014-09-01 DIAGNOSIS — Z7982 Long term (current) use of aspirin: Secondary | ICD-10-CM | POA: Diagnosis not present

## 2014-09-01 DIAGNOSIS — E1142 Type 2 diabetes mellitus with diabetic polyneuropathy: Secondary | ICD-10-CM | POA: Diagnosis not present

## 2014-09-01 DIAGNOSIS — M86171 Other acute osteomyelitis, right ankle and foot: Secondary | ICD-10-CM | POA: Diagnosis not present

## 2014-09-01 DIAGNOSIS — M1990 Unspecified osteoarthritis, unspecified site: Secondary | ICD-10-CM | POA: Diagnosis not present

## 2014-09-01 DIAGNOSIS — M868X7 Other osteomyelitis, ankle and foot: Secondary | ICD-10-CM | POA: Diagnosis present

## 2014-09-01 LAB — BASIC METABOLIC PANEL
Anion gap: 13 (ref 5–15)
BUN: 17 mg/dL (ref 6–23)
CALCIUM: 9.5 mg/dL (ref 8.4–10.5)
CHLORIDE: 100 meq/L (ref 96–112)
CO2: 27 mEq/L (ref 19–32)
CREATININE: 1.29 mg/dL (ref 0.50–1.35)
GFR calc Af Amer: 73 mL/min — ABNORMAL LOW (ref >90)
GFR calc non Af Amer: 63 mL/min — ABNORMAL LOW (ref >90)
Glucose, Bld: 106 mg/dL — ABNORMAL HIGH (ref 70–99)
Potassium: 5.1 mEq/L (ref 3.7–5.3)
Sodium: 140 mEq/L (ref 137–147)

## 2014-09-01 LAB — HEMOGLOBIN AND HEMATOCRIT, BLOOD
HEMATOCRIT: 45 % (ref 39.0–52.0)
HEMOGLOBIN: 16 g/dL (ref 13.0–17.0)

## 2014-09-01 NOTE — Pre-Procedure Instructions (Signed)
Patient given information to sign up for my chart at home. 

## 2014-09-01 NOTE — Patient Instructions (Signed)
[name]  09/01/2014   Your procedure is scheduled on:   09/02/2014  [facility] at  1030  AM.  Call this number if you have problems the morning of surgery: [phone]   Remember:   Do not eat food or drink liquids after midnight.   Take these medicines the morning of surgery with A SIP OF WATER:  Gabaprntin, hydrocodone, lisinopril, mobic. Take 1/2  Of your usual insulin dosage the night before your surgery.   Do not wear jewelry, make-up or nail polish.  Do not wear lotions, powders, or perfumes.   Do not shave 48 hours prior to surgery. Men may shave face and neck.  Do not bring valuables to the hospital.  [facility] is not [facility] any belongings or valuables.               Contacts, dentures or bridgework may not be worn into surgery.  Leave suitcase in the car. After surgery it may be brought to your room.  For patients admitted to the hospital, discharge time is determined by your treatment team.               Patients discharged the day of surgery will not be allowed to drive home.  Name and phone number of your driver: family  Special Instructions: Shower using CHG 2 nights before surgery and the night before surgery.  If you shower the day of surgery use CHG.  Use special wash - you have one bottle of CHG for all showers.  You should use approximately 1/3 of the bottle for each shower.   Please read over the following fact sheets that you were given: Pain Booklet, Coughing and Deep Breathing, Surgical Site Infection Prevention, Anesthesia Post-op Instructions and Care and Recovery After Surgery Toe Injuries and Amputations You have cut off (amputated) part of your toe. Your outcome depends largely on how much was amputated. If just the tip is amputated, often the end of the toe will grow back and the toe may return much to the same as it was before the injury. If more of the toe is missing, your caregiver has done the best with the tissue  remaining to allow you to keep as much toe as is possible or has finished the amputation at a level that will leave you with the most functional toe. This means a toe that will work the best for you. Please read the instructions outlined below and refer to this sheet in the next few weeks. These instructions provide you with general information on caring for yourself. Your caregiver may also give you specific instructions. While your treatment has been done according to the most current medical practices available, unavoidable complications occasionally occur. If you have any problems or questions after discharge, call your caregiver. HOME CARE INSTRUCTIONS   You may resume a normal diet and activities as directed or allowed.  Keep your foot elevated when possible. This helps decrease pain and swelling.  Keep ice packs (a bag of ice wrapped in a towel) on the injured area for 15-20 minutes, 03-04 times per day, for the first two days. Use ice only if OK with your caregiver.  Change dressings if necessary or as directed.  Clean the wounded area as directed.  Only take over-the-counter or prescription medicines for pain, discomfort, or fever as directed by your caregiver.  Keep appointments as directed. SEEK IMMEDIATE MEDICAL CARE IF:  There is redness,  swelling, numbness or increasing pain in the wound.  There is pus coming from wound.  You have an unexplained oral temperature above 102 F (38.9 C) or as your caregiver suggests.  There is a bad (foul) smell coming from the wound or dressing.  The edges of the wound break open (the edges are not staying together) after sutures or staples have been removed. Document Released: 09/13/2005 Document Revised: 01/15/2012 Document Reviewed: 02/10/2009 [facility] Patient Information 2015 [place] [place]. This information is not intended to replace advice given to you by your health care provider. Make sure you discuss any questions you have with your  health care provider. PATIENT INSTRUCTIONS POST-ANESTHESIA  IMMEDIATELY FOLLOWING SURGERY:  Do not drive or operate machinery for the first twenty four hours after surgery.  Do not make any important decisions for twenty four hours after surgery or while taking narcotic pain medications or sedatives.  If you develop intractable nausea and vomiting or a severe headache please notify your doctor immediately.  FOLLOW-UP:  Please make an appointment with your surgeon as instructed. You do not need to follow up with anesthesia unless specifically instructed to do so.  WOUND CARE INSTRUCTIONS (if applicable):  Keep a dry clean dressing on the anesthesia/puncture wound site if there is drainage.  Once the wound has quit draining you may leave it open to air.  Generally you should leave the bandage intact for twenty four hours unless there is drainage.  If the epidural site drains for more than 36-48 hours please call the anesthesia department.  QUESTIONS?:  Please feel free to call your physician or the hospital operator if you have any questions, and they will be happy to assist you.

## 2014-09-02 ENCOUNTER — Encounter (HOSPITAL_COMMUNITY)
Admission: RE | Disposition: A | Discharge status: Home / Self Care | Payer: Self-pay | Source: Ambulatory Visit | Attending: Podiatry

## 2014-09-02 ENCOUNTER — Ambulatory Visit (HOSPITAL_COMMUNITY): Payer: 59 | Admitting: Anesthesiology

## 2014-09-02 ENCOUNTER — Encounter (HOSPITAL_COMMUNITY): Payer: Self-pay | Admitting: *Deleted

## 2014-09-02 ENCOUNTER — Ambulatory Visit (HOSPITAL_COMMUNITY)
Admission: RE | Admit: 2014-09-02 | Discharge: 2014-09-02 | Disposition: A | Discharge status: Home / Self Care | Payer: 59 | Source: Ambulatory Visit | Attending: Podiatry | Admitting: Podiatry

## 2014-09-02 ENCOUNTER — Encounter (HOSPITAL_COMMUNITY): Payer: 59 | Admitting: Anesthesiology

## 2014-09-02 ENCOUNTER — Ambulatory Visit (HOSPITAL_COMMUNITY): Payer: 59

## 2014-09-02 DIAGNOSIS — M869 Osteomyelitis, unspecified: Secondary | ICD-10-CM

## 2014-09-02 DIAGNOSIS — M1990 Unspecified osteoarthritis, unspecified site: Secondary | ICD-10-CM | POA: Insufficient documentation

## 2014-09-02 DIAGNOSIS — M86171 Other acute osteomyelitis, right ankle and foot: Secondary | ICD-10-CM | POA: Diagnosis not present

## 2014-09-02 DIAGNOSIS — Z9889 Other specified postprocedural states: Secondary | ICD-10-CM

## 2014-09-02 DIAGNOSIS — Z7982 Long term (current) use of aspirin: Secondary | ICD-10-CM | POA: Insufficient documentation

## 2014-09-02 DIAGNOSIS — E1142 Type 2 diabetes mellitus with diabetic polyneuropathy: Secondary | ICD-10-CM | POA: Insufficient documentation

## 2014-09-02 DIAGNOSIS — Z79899 Other long term (current) drug therapy: Secondary | ICD-10-CM | POA: Insufficient documentation

## 2014-09-02 DIAGNOSIS — I10 Essential (primary) hypertension: Secondary | ICD-10-CM | POA: Insufficient documentation

## 2014-09-02 HISTORY — PX: AMPUTATION: SHX166

## 2014-09-02 LAB — GLUCOSE, CAPILLARY: Glucose-Capillary: 176 mg/dL — ABNORMAL HIGH (ref 70–99)

## 2014-09-02 SURGERY — AMPUTATION DIGIT
Anesthesia: Monitor Anesthesia Care | Site: Toe | Laterality: Right

## 2014-09-02 MED ORDER — FENTANYL CITRATE 0.05 MG/ML IJ SOLN: 25.0000 ug | INTRAMUSCULAR | Status: DC | PRN

## 2014-09-02 MED ORDER — FENTANYL CITRATE 0.05 MG/ML IJ SOLN
INTRAMUSCULAR | Status: AC
  Filled 2014-09-02: qty 2

## 2014-09-02 MED ORDER — 0.9 % SODIUM CHLORIDE (POUR BTL) OPTIME
TOPICAL | Status: DC | PRN
  Administered 2014-09-02: 500 mL

## 2014-09-02 MED ORDER — MIDAZOLAM HCL 5 MG/5ML IJ SOLN
INTRAMUSCULAR | Status: DC | PRN
  Administered 2014-09-02: 1 mg via INTRAVENOUS
  Administered 2014-09-02: 2 mg via INTRAVENOUS
  Administered 2014-09-02: 1 mg via INTRAVENOUS

## 2014-09-02 MED ORDER — LACTATED RINGERS IV SOLN
INTRAVENOUS | Status: DC
  Administered 2014-09-02: 1000 mL via INTRAVENOUS

## 2014-09-02 MED ORDER — PROPOFOL 10 MG/ML IV EMUL
INTRAVENOUS | Status: AC
  Filled 2014-09-02: qty 20

## 2014-09-02 MED ORDER — BUPIVACAINE HCL (PF) 0.5 % IJ SOLN
INTRAMUSCULAR | Status: DC | PRN
  Administered 2014-09-02: 10 mL

## 2014-09-02 MED ORDER — MIDAZOLAM HCL 2 MG/2ML IJ SOLN
INTRAMUSCULAR | Status: AC
  Filled 2014-09-02: qty 2

## 2014-09-02 MED ORDER — PROPOFOL INFUSION 10 MG/ML OPTIME
INTRAVENOUS | Status: DC | PRN
  Administered 2014-09-02: 60 ug/kg/min via INTRAVENOUS

## 2014-09-02 MED ORDER — CLINDAMYCIN PHOSPHATE 600 MG/50ML IV SOLN
600.0000 mg | Freq: Once | INTRAVENOUS | Status: AC
  Administered 2014-09-02: 600 mg via INTRAVENOUS
  Filled 2014-09-02: qty 50

## 2014-09-02 MED ORDER — ONDANSETRON HCL 4 MG/2ML IJ SOLN: 4.0000 mg | Freq: Once | INTRAMUSCULAR | Status: DC | PRN

## 2014-09-02 MED ORDER — BUPIVACAINE HCL (PF) 0.5 % IJ SOLN
INTRAMUSCULAR | Status: AC
  Filled 2014-09-02: qty 30

## 2014-09-02 MED ORDER — MIDAZOLAM HCL 2 MG/2ML IJ SOLN
1.0000 mg | INTRAMUSCULAR | Status: DC | PRN
  Administered 2014-09-02: 2 mg via INTRAVENOUS

## 2014-09-02 MED ORDER — FENTANYL CITRATE 0.05 MG/ML IJ SOLN
INTRAMUSCULAR | Status: DC | PRN
  Administered 2014-09-02: 50 ug via INTRAVENOUS
  Administered 2014-09-02 (×2): 25 ug via INTRAVENOUS

## 2014-09-02 MED ORDER — FENTANYL CITRATE 0.05 MG/ML IJ SOLN
25.0000 ug | INTRAMUSCULAR | Status: AC
  Administered 2014-09-02 (×2): 25 ug via INTRAVENOUS

## 2014-09-02 SURGICAL SUPPLY — 37 items
BAG HAMPER (MISCELLANEOUS) ×2 IMPLANT
BANDAGE ELASTIC 4 VELCRO NS (GAUZE/BANDAGES/DRESSINGS) ×2 IMPLANT
BANDAGE ESMARK 4X12 BL STRL LF (DISPOSABLE) ×1 IMPLANT
BANDAGE GAUZE ELAST BULKY 4 IN (GAUZE/BANDAGES/DRESSINGS) ×2 IMPLANT
BLADE 15 SAFETY STRL DISP (BLADE) ×4 IMPLANT
BLADE AVERAGE 25X9 (BLADE) IMPLANT
BNDG CONFORM 2 STRL LF (GAUZE/BANDAGES/DRESSINGS) IMPLANT
BNDG ESMARK 4X12 BLUE STRL LF (DISPOSABLE) ×2
BNDG GAUZE ELAST 4 BULKY (GAUZE/BANDAGES/DRESSINGS) ×2 IMPLANT
CLOTH BEACON ORANGE TIMEOUT ST (SAFETY) ×2 IMPLANT
COVER LIGHT HANDLE STERIS (MISCELLANEOUS) ×4 IMPLANT
CUFF TOURNIQUET SINGLE 18IN (TOURNIQUET CUFF) ×2 IMPLANT
DECANTER SPIKE VIAL GLASS SM (MISCELLANEOUS) ×2 IMPLANT
DRSG ADAPTIC 3X8 NADH LF (GAUZE/BANDAGES/DRESSINGS) ×2 IMPLANT
ELECT REM PT RETURN 9FT ADLT (ELECTROSURGICAL) ×2
ELECTRODE REM PT RTRN 9FT ADLT (ELECTROSURGICAL) ×1 IMPLANT
GAUZE SPONGE 4X4 12PLY STRL (GAUZE/BANDAGES/DRESSINGS) ×2 IMPLANT
GLOVE BIO SURGEON STRL SZ7.5 (GLOVE) ×2 IMPLANT
GLOVE BIOGEL PI IND STRL 7.0 (GLOVE) ×1 IMPLANT
GLOVE BIOGEL PI INDICATOR 7.0 (GLOVE) ×1
GLOVE ECLIPSE 7.0 STRL STRAW (GLOVE) ×2 IMPLANT
GOWN STRL REUS W/TWL LRG LVL3 (GOWN DISPOSABLE) ×4 IMPLANT
KIT ROOM TURNOVER APOR (KITS) ×2 IMPLANT
MANIFOLD NEPTUNE II (INSTRUMENTS) ×2 IMPLANT
NEEDLE HYPO 27GX1-1/4 (NEEDLE) ×4 IMPLANT
NS IRRIG 1000ML POUR BTL (IV SOLUTION) ×2 IMPLANT
PACK BASIC LIMB (CUSTOM PROCEDURE TRAY) ×2 IMPLANT
PAD ARMBOARD 7.5X6 YLW CONV (MISCELLANEOUS) ×2 IMPLANT
RASP SM TEAR CROSS CUT (RASP) IMPLANT
SET BASIN LINEN APH (SET/KITS/TRAYS/PACK) ×2 IMPLANT
SOL PREP PROV IODINE SCRUB 4OZ (MISCELLANEOUS) IMPLANT
SPONGE GAUZE 4X4 12PLY (GAUZE/BANDAGES/DRESSINGS) ×2 IMPLANT
SPONGE LAP 18X18 X RAY DECT (DISPOSABLE) ×2 IMPLANT
SUT ETHILON 4 0 PS 2 18 (SUTURE) IMPLANT
SUT PROLENE 4 0 PS 2 18 (SUTURE) ×2 IMPLANT
SUT VIC AB 4-0 PS2 27 (SUTURE) IMPLANT
SYRINGE CONTROL L 12CC (SYRINGE) ×4 IMPLANT

## 2014-09-02 NOTE — H&P (Signed)
HISTORY AND PHYSICAL INTERVAL NOTE:  09/02/2014  12:21 PM  [name]  has presented today for surgery, with the diagnosis of osteomyelitis 2nd toe right foot, diabetes mellitus with peripheral neuropathy.  The various methods of treatment have been discussed with the patient.  No guarantees were given.  After consideration of risks, benefits and other options for treatment, the patient has consented to surgery.  I have reviewed the patients' chart and labs.    Patient Vitals for the past 24 hrs:  BP Temp Temp src Pulse Resp SpO2  09/02/14 1200 95/58 mmHg - - - 19 97 %  09/02/14 1145 99/52 mmHg - - - 20 97 %  09/02/14 1130 99/62 mmHg - - - 19 97 %  09/02/14 1115 101/64 mmHg - - - 13 97 %  09/02/14 1100 101/63 mmHg - - - 26 97 %  09/02/14 1050 108/69 mmHg - - - 20 95 %  09/02/14 1045 105/77 mmHg - - - 35 92 %  09/02/14 1032 126/69 mmHg 98 F (36.7 C) Oral 103 21 93 %    A history and physical examination was performed in my office.  The patient was reexamined.  There have been no changes to this history and physical examination.  Anthony SchimkeBenjamin Ivan Maddy [name], DPM

## 2014-09-02 NOTE — Anesthesia Postprocedure Evaluation (Signed)
  Anesthesia Post-op Note  Patient: [name]  Procedure(s) Performed: Procedure(s): PARTIAL AMPUTATION 2ND TOE RIGHT FOOT (Right)  Patient Location: PACU  Anesthesia Type:MAC  Level of Consciousness: awake, alert , oriented and patient cooperative  Airway and Oxygen Therapy: Patient Spontanous Breathing  Post-op Pain: 2 /10, mild  Post-op Assessment: Post-op Vital signs reviewed, Patient's Cardiovascular Status Stable, Respiratory Function Stable, Patent Airway, No signs of Nausea or vomiting and Pain level controlled  Post-op Vital Signs: Reviewed and stable  Last Vitals:  Filed Vitals:   09/02/14 1245  BP: 91/61  Pulse:   Temp:   Resp: 19    Complications: No apparent anesthesia complications

## 2014-09-02 NOTE — Transfer of Care (Signed)
Immediate Anesthesia Transfer of Care Note  Patient: [name]  Procedure(s) Performed: Procedure(s): PARTIAL AMPUTATION 2ND TOE RIGHT FOOT (Right)  Patient Location: PACU  Anesthesia Type:MAC  Level of Consciousness: awake, alert  and patient cooperative  Airway & Oxygen Therapy: Patient Spontanous Breathing and Patient connected to face mask oxygen  Post-op Assessment: Report given to PACU RN, Post -op Vital signs reviewed and stable and Patient moving all extremities  Post vital signs: Reviewed and stable  Complications: No apparent anesthesia complications

## 2014-09-02 NOTE — Anesthesia Preprocedure Evaluation (Signed)
Anesthesia Evaluation  Patient identified by MRN, date of birth, ID band Patient awake    Reviewed: Allergy & Precautions, H&P , NPO status   Airway Mallampati: I  TM Distance: >3 FB     Dental  (+) Teeth Intact   Pulmonary  breath sounds clear to auscultation        Cardiovascular hypertension, Rhythm:Regular     Neuro/Psych  Neuromuscular disease (peripheral neuropathy)    GI/Hepatic   Endo/Other  diabetes, Type 2, Oral Hypoglycemic Agents  Renal/GU      Musculoskeletal  (+) Arthritis -,   Abdominal   Peds  Hematology   Anesthesia Other Findings   Reproductive/Obstetrics                             Anesthesia Physical Anesthesia Plan  ASA: III  Anesthesia Plan: MAC   Post-op Pain Management:    Induction: Intravenous  Airway Management Planned: Simple Face Mask  Additional Equipment:   Intra-op Plan:   Post-operative Plan:   Informed Consent: I have reviewed the patients History and Physical, chart, labs and discussed the procedure including the risks, benefits and alternatives for the proposed anesthesia with the patient or authorized representative who has indicated his/her understanding and acceptance.     Plan Discussed with:   Anesthesia Plan Comments:         Anesthesia Quick Evaluation

## 2014-09-02 NOTE — Op Note (Signed)
OPERATIVE NOTE  DATE OF PROCEDURE:  09/02/2014  SURGEON:   Dallas SchimkeBenjamin Ivan Kmya Placide, DPM  OR STAFF:   Circulator: Eliane Decreeatherine Jann Page, RN Scrub Person: Donald Poreeborah A Blackwell, CST   PREOPERATIVE DIAGNOSIS:   1.  Osteomyelitis of the second toe, right foot (ICD 10 M86.9) 2.  Diabetes mellitus with peripheral neuropathy (ICD 10 E 11.42)  POSTOPERATIVE DIAGNOSIS: Same  PROCEDURE: Partial amputation of the second toe, right foot (CPT [phone])  ANESTHESIA:  Monitor Anesthesia Care   HEMOSTASIS:   Pneumatic ankle tourniquet set at 250 mmHg  ESTIMATED BLOOD LOSS:   Minimal (<5 cc)  MATERIALS USED:  None  INJECTABLES: Marcaine 0.5% plain; 10mL  PATHOLOGY:   Distal aspect of the second toe, right foot  COMPLICATIONS:   None  INDICATIONS:  Ulceration of the right second toe with necrosis and radiographic changes consistent with osteomyelitis.  DESCRIPTION OF THE PROCEDURE:   The patient was brought to the operating room and placed on the operative table in the supine position.  A pneumatic ankle tourniquet was applied to the patient's ankle.  Following sedation, the surgical site was anesthetized with 0.5% Marcaine plain.  The foot was then prepped, scrubbed, and draped in the usual sterile technique.  The foot was elevated, exsanguinated and the pneumatic ankle tourniquet inflated to 250 mmHg.    Attention was directed to the distal aspect of the right second toe.  2 converging semi-elliptical incisions were made encompassing the toe circumferentially.  Dissection was continued deep down to the level of the distal interphalangeal joint.  The distal interphalangeal joint was disarticulated.  The distal aspect of the toe was removed, passed from the operative field and sent to pathology for evaluation.  Upon inspection of the head of the middle phalanx, a small defect in the articular surface was identified.  The head of the middle phalanx was resected and passed from the operative  field.  The surgical wound was irrigated with copious amounts of sterile irrigant.  The skin was reapproximated using 4-0 Prolene in a simple suture technique.  A sterile dressing was applied to the right foot.  The pneumatic ankle tourniquet was deflated and a prompt hyperemic response was noted to all digits of the operative foot.   The patient tolerated the procedure well.  The patient was then transferred to PACU with vital signs stable and vascular status intact to all toes of the operative foot.  Following a period of postoperative monitoring, the patient will be discharged home.

## 2014-09-02 NOTE — Discharge Instructions (Signed)
Incision Care °An incision is when a surgeon cuts into your body tissues. After surgery, the incision needs to be cared for properly to prevent infection.  °HOME CARE INSTRUCTIONS  °· Take all medicine as directed by your caregiver. Only take over-the-counter or prescription medicines for pain, discomfort, or fever as directed by your caregiver. °· Do not remove your bandage (dressing) or get your incision wet until your surgeon gives you permission. In the event that your dressing becomes wet, dirty, or starts to smell, change the dressing and call your surgeon for instructions as soon as possible. °· Take showers. Do not take tub baths, swim, or do anything that may soak the wound until it is healed. °· Resume your normal diet and activities as directed or allowed. °· Avoid lifting any weight until you are instructed otherwise. °· Use anti-itch antihistamine medicine as directed by your caregiver. The wound may itch when it is healing. Do not pick or scratch at the wound. °· Follow up with your caregiver for stitch (suture) or staple removal as directed. °· Drink enough fluids to keep your urine clear or pale yellow. °SEEK MEDICAL CARE IF:  °· You have redness, swelling, or increasing pain in the wound that is not controlled with medicine. °· You have drainage, blood, or pus coming from the wound that lasts longer than 1 day. °· You develop muscle aches, chills, or a general ill feeling. °· You notice a bad smell coming from the wound or dressing. °· Your wound edges separate after the sutures, staples, or skin adhesive strips have been removed. °· You develop persistent nausea or vomiting. °SEEK IMMEDIATE MEDICAL CARE IF:  °· You have a fever. °· You develop a rash. °· You develop dizzy episodes or faint while standing. °· You have difficulty breathing. °· You develop any reaction or side effects to medicine given. °MAKE SURE YOU:  °· Understand these instructions. °· Will watch your condition. °· Will get help  right away if you are not doing well or get worse. °Document Released: 05/12/2005 Document Revised: 01/15/2012 Document Reviewed: 12/17/2013 °ExitCare® Patient Information ©2015 [employer], LLC. This information is not intended to replace advice given to you by your health care provider. Make sure you discuss any questions you have with your health care provider. ° ° °

## 2014-09-02 NOTE — Addendum Note (Signed)
Addended by: Ferman HammingMCKINNEY, Jonni Oelkers on: 09/02/2014 07:30 AM   Modules accepted: Orders

## 2014-09-03 ENCOUNTER — Encounter (HOSPITAL_COMMUNITY): Payer: Self-pay | Admitting: Podiatry

## 2014-09-03 LAB — GLUCOSE, CAPILLARY: Glucose-Capillary: 138 mg/dL — ABNORMAL HIGH (ref 70–99)

## 2014-09-30 ENCOUNTER — Other Ambulatory Visit: Payer: Self-pay | Admitting: *Deleted

## 2014-09-30 MED ORDER — GLUCOSE BLOOD VI STRP: ORAL_STRIP | Status: DC

## 2014-10-08 ENCOUNTER — Telehealth: Payer: Self-pay | Admitting: Family Medicine

## 2014-10-08 ENCOUNTER — Other Ambulatory Visit: Payer: Self-pay | Admitting: Nurse Practitioner

## 2014-10-08 NOTE — Telephone Encounter (Signed)
[name]

## 2014-10-08 NOTE — Telephone Encounter (Signed)
Rx for Metformin okayed by Ander SladeBill [place] [facility]

## 2014-10-09 NOTE — Telephone Encounter (Signed)
Refill on Metformin done by Ander SladeBill Oxford.

## 2014-10-16 ENCOUNTER — Telehealth: Payer: Self-pay | Admitting: Family Medicine

## 2014-10-16 MED ORDER — METFORMIN HCL 1000 MG PO TABS: ORAL_TABLET | ORAL | Status: DC

## 2014-10-16 NOTE — Telephone Encounter (Signed)
done

## 2014-10-30 ENCOUNTER — Other Ambulatory Visit: Payer: Self-pay | Admitting: Family Medicine

## 2014-11-24 ENCOUNTER — Telehealth: Payer: Self-pay | Admitting: Family Medicine

## 2014-11-24 NOTE — Telephone Encounter (Signed)
Pt notified that he will need diabetic recheck this month and he can discuss meds at appt Verbalizes understanding

## 2014-11-29 ENCOUNTER — Other Ambulatory Visit: Payer: Self-pay | Admitting: Family Medicine

## 2014-12-09 ENCOUNTER — Other Ambulatory Visit: Payer: Self-pay | Admitting: Nurse Practitioner

## 2015-01-01 ENCOUNTER — Other Ambulatory Visit: Payer: Self-pay | Admitting: Family Medicine

## 2015-01-04 NOTE — Telephone Encounter (Signed)
Last seen 09/03/15 B Oxford  Last glucose in hospital 10/15

## 2015-01-30 ENCOUNTER — Other Ambulatory Visit: Payer: Self-pay | Admitting: Family Medicine

## 2015-01-31 ENCOUNTER — Other Ambulatory Visit: Payer: Self-pay | Admitting: Family Medicine

## 2015-02-01 ENCOUNTER — Other Ambulatory Visit: Payer: Self-pay

## 2015-02-01 MED ORDER — METFORMIN HCL 1000 MG PO TABS: ORAL_TABLET | ORAL | Status: DC

## 2015-02-27 ENCOUNTER — Other Ambulatory Visit: Payer: Self-pay | Admitting: Family Medicine

## 2015-03-01 NOTE — Telephone Encounter (Signed)
no more refills without being seen  

## 2015-03-02 NOTE — Telephone Encounter (Signed)
lmovm that refills were sent to pharmacy, but NTBS before more refills

## 2015-03-05 ENCOUNTER — Other Ambulatory Visit: Payer: Self-pay | Admitting: Family Medicine

## 2015-03-23 ENCOUNTER — Other Ambulatory Visit: Payer: Self-pay | Admitting: Family Medicine

## 2015-03-24 MED ORDER — LISINOPRIL 20 MG PO TABS: ORAL_TABLET | ORAL | Status: DC

## 2015-03-24 NOTE — Telephone Encounter (Signed)
done

## 2015-03-25 ENCOUNTER — Other Ambulatory Visit: Payer: Self-pay | Admitting: Family Medicine

## 2015-03-25 NOTE — Telephone Encounter (Signed)
Last seen 08/31/14 B Oxford

## 2015-03-29 ENCOUNTER — Other Ambulatory Visit (HOSPITAL_COMMUNITY): Payer: Self-pay | Admitting: Podiatry

## 2015-03-29 ENCOUNTER — Ambulatory Visit (HOSPITAL_COMMUNITY)
Admission: RE | Admit: 2015-03-29 | Discharge: 2015-03-29 | Disposition: A | Discharge status: Home / Self Care | Payer: 59 | Source: Ambulatory Visit | Attending: Podiatry | Admitting: Podiatry

## 2015-03-29 DIAGNOSIS — L03031 Cellulitis of right toe: Secondary | ICD-10-CM | POA: Insufficient documentation

## 2015-03-29 DIAGNOSIS — M869 Osteomyelitis, unspecified: Secondary | ICD-10-CM

## 2015-03-29 DIAGNOSIS — S91101A Unspecified open wound of right great toe without damage to nail, initial encounter: Secondary | ICD-10-CM | POA: Diagnosis not present

## 2015-03-29 DIAGNOSIS — X58XXXA Exposure to other specified factors, initial encounter: Secondary | ICD-10-CM | POA: Insufficient documentation

## 2015-03-29 DIAGNOSIS — L02611 Cutaneous abscess of right foot: Secondary | ICD-10-CM | POA: Diagnosis not present

## 2015-03-29 DIAGNOSIS — M79671 Pain in right foot: Secondary | ICD-10-CM | POA: Diagnosis present

## 2015-03-29 LAB — POCT I-STAT CREATININE: Creatinine, Ser: 0.9 mg/dL (ref 0.61–1.24)

## 2015-03-29 MED ORDER — GADOBENATE DIMEGLUMINE 529 MG/ML IV SOLN
20.0000 mL | Freq: Once | INTRAVENOUS | Status: AC | PRN
  Administered 2015-03-29: 20 mL via INTRAVENOUS

## 2015-03-31 ENCOUNTER — Ambulatory Visit (HOSPITAL_COMMUNITY)
Admission: RE | Admit: 2015-03-31 | Discharge: 2015-03-31 | Disposition: A | Discharge status: Home / Self Care | Payer: 59 | Source: Ambulatory Visit | Attending: Podiatry | Admitting: Podiatry

## 2015-03-31 ENCOUNTER — Encounter (HOSPITAL_COMMUNITY): Payer: Self-pay

## 2015-03-31 ENCOUNTER — Other Ambulatory Visit: Payer: Self-pay | Admitting: Podiatry

## 2015-03-31 ENCOUNTER — Encounter (HOSPITAL_COMMUNITY)
Admission: RE | Admit: 2015-03-31 | Discharge: 2015-03-31 | Disposition: A | Discharge status: Home / Self Care | Payer: 59 | Source: Ambulatory Visit | Attending: Podiatry | Admitting: Podiatry

## 2015-03-31 DIAGNOSIS — M86179 Other acute osteomyelitis, unspecified ankle and foot: Secondary | ICD-10-CM | POA: Insufficient documentation

## 2015-03-31 LAB — CBC
HCT: 44.2 % (ref 39.0–52.0)
Hemoglobin: 15.3 g/dL (ref 13.0–17.0)
MCH: 31.4 pg (ref 26.0–34.0)
MCHC: 34.6 g/dL (ref 30.0–36.0)
MCV: 90.6 fL (ref 78.0–100.0)
PLATELETS: 260 10*3/uL (ref 150–400)
RBC: 4.88 MIL/uL (ref 4.22–5.81)
RDW: 12.2 % (ref 11.5–15.5)
WBC: 8.3 10*3/uL (ref 4.0–10.5)

## 2015-03-31 LAB — BASIC METABOLIC PANEL
ANION GAP: 11 (ref 5–15)
BUN: 16 mg/dL (ref 6–20)
CHLORIDE: 98 mmol/L — AB (ref 101–111)
CO2: 28 mmol/L (ref 22–32)
Calcium: 9.3 mg/dL (ref 8.9–10.3)
Creatinine, Ser: 1.04 mg/dL (ref 0.61–1.24)
GFR calc Af Amer: >60 mL/min (ref >60)
GFR calc non Af Amer: >60 mL/min (ref >60)
Glucose, Bld: 301 mg/dL — ABNORMAL HIGH (ref 65–99)
Potassium: 4.6 mmol/L (ref 3.5–5.1)
SODIUM: 137 mmol/L (ref 135–145)

## 2015-03-31 NOTE — Patient Instructions (Addendum)
Your procedure is scheduled on: 04/01/2015  [facility] at  12:10   AM.  Call this number if you have problems the morning of surgery: [phone]   Remember:   Do not drink or eat food:After Midnight.  :  Take these medicines the morning of surgery with A SIP OF WATER: Lisinopril   Do not wear jewelry, make-up or nail polish.  Do not wear lotions, powders, or perfumes. You may wear deodorant.  Do not shave 48 hours prior to surgery. Men may shave face and neck.  Do not bring valuables to the hospital.  Contacts, dentures or bridgework may not be worn into surgery.  Leave suitcase in the car. After surgery it may be brought to your room.  For patients admitted to the hospital, checkout time is 11:00 AM the day of discharge.   Patients discharged the day of surgery will not be allowed to drive home.    Special Instructions: Shower using CHG night before surgery and shower the day of surgery use CHG.  Use special wash - you have one bottle of CHG for all showers.  You should use approximately 1/2 of the bottle for each shower.   Please read over the following fact sheets that you were given: Pain Booklet, MRSA Information, Surgical Site Infection Prevention and Care and Recovery After Surgery  Toe Injuries and Amputations You have cut off (amputated) part of your toe. Your outcome depends largely on how much was amputated. If just the tip is amputated, often the end of the toe will grow back and the toe may return much to the same as it was before the injury. If more of the toe is missing, your caregiver has done the best with the tissue remaining to allow you to keep as much toe as is possible or has finished the amputation at a level that will leave you with the most functional toe. This means a toe that will work the best for you. Please read the instructions outlined below and refer to this sheet in the next few weeks. These instructions provide you with general information on  caring for yourself. Your caregiver may also give you specific instructions. While your treatment has been done according to the most current medical practices available, unavoidable complications occasionally occur. If you have any problems or questions after discharge, call your caregiver. HOME CARE INSTRUCTIONS   You may resume a normal diet and activities as directed or allowed.  Keep your foot elevated when possible. This helps decrease pain and swelling.  Keep ice packs (a bag of ice wrapped in a towel) on the injured area for 15-20 minutes, 03-04 times per day, for the first two days. Use ice only if OK with your caregiver.  Change dressings if necessary or as directed.  Clean the wounded area as directed.  Only take over-the-counter or prescription medicines for pain, discomfort, or fever as directed by your caregiver.  Keep appointments as directed. SEEK IMMEDIATE MEDICAL CARE IF:  There is redness, swelling, numbness or increasing pain in the wound.  There is pus coming from wound.  You have an unexplained oral temperature above 102 F (38.9 C) or as your caregiver suggests.  There is a bad (foul) smell coming from the wound or dressing.  The edges of the wound break open (the edges are not staying together) after sutures or staples have been removed. Document Released: 09/13/2005 Document Revised: 01/15/2012 Document Reviewed: 02/10/2009 ExitCare Patient Information 2015 [employer]. This information is not intended to replace advice given to you by your health care provider. Make sure you discuss any questions you have with your health care provider.  

## 2015-04-01 ENCOUNTER — Encounter (HOSPITAL_COMMUNITY)
Admission: RE | Disposition: A | Discharge status: Home / Self Care | Payer: Self-pay | Source: Ambulatory Visit | Attending: Podiatry

## 2015-04-01 ENCOUNTER — Ambulatory Visit (HOSPITAL_COMMUNITY)
Admission: RE | Admit: 2015-04-01 | Discharge: 2015-04-01 | Disposition: A | Discharge status: Home / Self Care | Payer: 59 | Source: Ambulatory Visit | Attending: Podiatry | Admitting: Podiatry

## 2015-04-01 ENCOUNTER — Ambulatory Visit (HOSPITAL_COMMUNITY): Payer: 59 | Admitting: Anesthesiology

## 2015-04-01 ENCOUNTER — Other Ambulatory Visit (HOSPITAL_COMMUNITY): Payer: Self-pay

## 2015-04-01 ENCOUNTER — Encounter (HOSPITAL_COMMUNITY): Payer: Self-pay | Admitting: *Deleted

## 2015-04-01 DIAGNOSIS — M1990 Unspecified osteoarthritis, unspecified site: Secondary | ICD-10-CM | POA: Insufficient documentation

## 2015-04-01 DIAGNOSIS — M868X7 Other osteomyelitis, ankle and foot: Secondary | ICD-10-CM | POA: Diagnosis present

## 2015-04-01 DIAGNOSIS — M87877 Other osteonecrosis, right toe(s): Secondary | ICD-10-CM | POA: Insufficient documentation

## 2015-04-01 DIAGNOSIS — M86171 Other acute osteomyelitis, right ankle and foot: Secondary | ICD-10-CM | POA: Insufficient documentation

## 2015-04-01 DIAGNOSIS — G629 Polyneuropathy, unspecified: Secondary | ICD-10-CM | POA: Insufficient documentation

## 2015-04-01 DIAGNOSIS — I10 Essential (primary) hypertension: Secondary | ICD-10-CM | POA: Insufficient documentation

## 2015-04-01 DIAGNOSIS — E119 Type 2 diabetes mellitus without complications: Secondary | ICD-10-CM | POA: Diagnosis not present

## 2015-04-01 HISTORY — PX: AMPUTATION: SHX166

## 2015-04-01 LAB — GLUCOSE, CAPILLARY
Glucose-Capillary: 211 mg/dL — ABNORMAL HIGH (ref 65–99)
Glucose-Capillary: 161 mg/dL — ABNORMAL HIGH (ref 65–99)

## 2015-04-01 SURGERY — AMPUTATION DIGIT
Anesthesia: Monitor Anesthesia Care | Site: Toe | Laterality: Right

## 2015-04-01 MED ORDER — MIDAZOLAM HCL 5 MG/5ML IJ SOLN
INTRAMUSCULAR | Status: DC | PRN
  Administered 2015-04-01: 2 mg via INTRAVENOUS

## 2015-04-01 MED ORDER — VANCOMYCIN HCL 10 G IV SOLR
2000.0000 mg | Freq: Once | INTRAVENOUS | Status: AC
  Administered 2015-04-01: 2000 mg via INTRAVENOUS
  Filled 2015-04-01: qty 500

## 2015-04-01 MED ORDER — 0.9 % SODIUM CHLORIDE (POUR BTL) OPTIME
TOPICAL | Status: DC | PRN
  Administered 2015-04-01: 1000 mL

## 2015-04-01 MED ORDER — FENTANYL CITRATE (PF) 100 MCG/2ML IJ SOLN
25.0000 ug | INTRAMUSCULAR | Status: AC
  Administered 2015-04-01 (×2): 25 ug via INTRAVENOUS
  Filled 2015-04-01: qty 2

## 2015-04-01 MED ORDER — ONDANSETRON HCL 4 MG/2ML IJ SOLN: 4.0000 mg | Freq: Once | INTRAMUSCULAR | Status: DC | PRN

## 2015-04-01 MED ORDER — PROPOFOL 10 MG/ML IV BOLUS
INTRAVENOUS | Status: AC
  Filled 2015-04-01: qty 20

## 2015-04-01 MED ORDER — LACTATED RINGERS IV SOLN
INTRAVENOUS | Status: DC
  Administered 2015-04-01: 14:00:00 via INTRAVENOUS

## 2015-04-01 MED ORDER — MIDAZOLAM HCL 2 MG/2ML IJ SOLN
1.0000 mg | INTRAMUSCULAR | Status: DC | PRN
  Administered 2015-04-01: 2 mg via INTRAVENOUS
  Filled 2015-04-01: qty 2

## 2015-04-01 MED ORDER — MIDAZOLAM HCL 2 MG/2ML IJ SOLN
INTRAMUSCULAR | Status: AC
  Filled 2015-04-01: qty 2

## 2015-04-01 MED ORDER — DEXTROSE 5 % IV SOLN
INTRAVENOUS | Status: DC | PRN
  Administered 2015-04-01: 14:00:00 via INTRAVENOUS

## 2015-04-01 MED ORDER — FENTANYL CITRATE (PF) 100 MCG/2ML IJ SOLN
INTRAMUSCULAR | Status: AC
  Filled 2015-04-01: qty 2

## 2015-04-01 MED ORDER — BUPIVACAINE HCL (PF) 0.25 % IJ SOLN
INTRAMUSCULAR | Status: AC
  Filled 2015-04-01: qty 30

## 2015-04-01 MED ORDER — PROPOFOL INFUSION 10 MG/ML OPTIME
INTRAVENOUS | Status: DC | PRN
  Administered 2015-04-01: 125 ug/kg/min via INTRAVENOUS

## 2015-04-01 MED ORDER — FENTANYL CITRATE (PF) 100 MCG/2ML IJ SOLN: 25.0000 ug | INTRAMUSCULAR | Status: DC | PRN

## 2015-04-01 MED ORDER — FENTANYL CITRATE (PF) 100 MCG/2ML IJ SOLN
INTRAMUSCULAR | Status: DC | PRN
  Administered 2015-04-01: 50 ug via INTRAVENOUS

## 2015-04-01 MED ORDER — LIDOCAINE HCL 1 % IJ SOLN
INTRAMUSCULAR | Status: DC | PRN
  Administered 2015-04-01: 10 mL

## 2015-04-01 MED ORDER — LIDOCAINE HCL (PF) 1 % IJ SOLN
INTRAMUSCULAR | Status: AC
  Filled 2015-04-01: qty 30

## 2015-04-01 SURGICAL SUPPLY — 39 items
BAG HAMPER (MISCELLANEOUS) ×2 IMPLANT
BANDAGE ELASTIC 4 VELCRO NS (GAUZE/BANDAGES/DRESSINGS) ×2 IMPLANT
BANDAGE ESMARK 4X12 BL STRL LF (DISPOSABLE) ×1 IMPLANT
BLADE SURG 15 STRL LF DISP TIS (BLADE) ×1 IMPLANT
BLADE SURG 15 STRL SS (BLADE) ×1
BNDG ESMARK 4X12 BLUE STRL LF (DISPOSABLE) ×2
BNDG GAUZE ELAST 4 BULKY (GAUZE/BANDAGES/DRESSINGS) ×2 IMPLANT
CLOTH BEACON ORANGE TIMEOUT ST (SAFETY) ×2 IMPLANT
COVER LIGHT HANDLE STERIS (MISCELLANEOUS) ×2 IMPLANT
CUFF TOURNIQUET SINGLE 18IN (TOURNIQUET CUFF) ×2 IMPLANT
DRAPE ORTHO 2.5IN SPLIT 77X108 (DRAPES) IMPLANT
DRAPE ORTHO SPLIT 77X108 STRL (DRAPES)
DRSG XEROFORM 1X8 (GAUZE/BANDAGES/DRESSINGS) ×2 IMPLANT
ELECT REM PT RETURN 9FT ADLT (ELECTROSURGICAL) ×2
ELECTRODE REM PT RTRN 9FT ADLT (ELECTROSURGICAL) ×1 IMPLANT
FORMALIN 10 PREFIL 120ML (MISCELLANEOUS) ×2 IMPLANT
GAUZE SPONGE 4X4 12PLY STRL (GAUZE/BANDAGES/DRESSINGS) ×2 IMPLANT
GLOVE BIO SURGEON STRL SZ 6 (GLOVE) ×2 IMPLANT
GLOVE BIOGEL PI IND STRL 6 (GLOVE) ×1 IMPLANT
GLOVE BIOGEL PI IND STRL 6.5 (GLOVE) ×1 IMPLANT
GLOVE BIOGEL PI INDICATOR 6 (GLOVE) ×1
GLOVE BIOGEL PI INDICATOR 6.5 (GLOVE) ×1
GLOVE ECLIPSE 6.5 STRL STRAW (GLOVE) ×2 IMPLANT
GLOVE INDICATOR 7.0 STRL GRN (GLOVE) ×2 IMPLANT
GOWN STRL REUS W/TWL LRG LVL3 (GOWN DISPOSABLE) ×2 IMPLANT
KIT ROOM TURNOVER APOR (KITS) ×2 IMPLANT
MANIFOLD NEPTUNE II (INSTRUMENTS) ×2 IMPLANT
NEEDLE HYPO 27GX1-1/4 (NEEDLE) ×4 IMPLANT
NS IRRIG 1000ML POUR BTL (IV SOLUTION) ×2 IMPLANT
PACK BASIC LIMB (CUSTOM PROCEDURE TRAY) ×2 IMPLANT
PAD ARMBOARD 7.5X6 YLW CONV (MISCELLANEOUS) ×2 IMPLANT
SET BASIN LINEN APH (SET/KITS/TRAYS/PACK) ×2 IMPLANT
SPONGE GAUZE 4X4 12PLY (GAUZE/BANDAGES/DRESSINGS) ×2 IMPLANT
SUT ETHILON 3 0 FSL (SUTURE) ×2 IMPLANT
SUT ETHILON 4 0 PS 2 18 (SUTURE) ×2 IMPLANT
SUT VIC AB 4-0 PS2 27 (SUTURE) IMPLANT
SUT VICRYL AB 3-0 FS1 BRD 27IN (SUTURE) IMPLANT
SYR CONTROL 10ML LL (SYRINGE) ×2 IMPLANT
TOWEL OR 17X26 4PK STRL BLUE (TOWEL DISPOSABLE) ×2 IMPLANT

## 2015-04-01 NOTE — H&P (Signed)
HISTORY AND PHYSICAL INTERVAL NOTE:  04/01/2015  1:38 PM  [name]  has presented today for surgery, with the diagnosis of osteomyelitis and right hallux amputation.  The various methods of treatment have been discussed with the patient.  No guarantees were given.  After consideration of risks, benefits and other options for treatment, the patient has consented to surgery.  I have reviewed the patients' chart and labs.    Patient Vitals for the past 24 hrs:  BP Temp Temp src Pulse Resp SpO2 Height Weight  04/01/15 1307 106/78 mmHg 98 F (36.7 C) Oral 88 18 98 % 6\' 4"  (1.93 m) 102.059 kg (225 lb)    A history and physical examination was performed in my office.  The patient was reexamined.  There have been no changes to this history and physical examination.  [name], DPM

## 2015-04-01 NOTE — Transfer of Care (Signed)
Immediate Anesthesia Transfer of Care Note  Patient: [name]  Procedure(s) Performed: Procedure(s): AMPUTATION DIGIT 1ST TOE RIGHT FOOT (Right)  Patient Location: PACU  Anesthesia Type:MAC  Level of Consciousness: awake, alert , oriented and patient cooperative  Airway & Oxygen Therapy: Patient Spontanous Breathing  Post-op Assessment: Report given to RN, Post -op Vital signs reviewed and stable and Patient moving all extremities  Post vital signs: Reviewed and stable  Last Vitals:  Filed Vitals:   04/01/15 1400  BP: 112/68  Pulse:   Temp:   Resp: 22    Complications: No apparent anesthesia complications

## 2015-04-01 NOTE — Anesthesia Preprocedure Evaluation (Signed)
Anesthesia Evaluation  Patient identified by MRN, date of birth, ID band Patient awake    Reviewed: Allergy & Precautions, H&P , NPO status   Airway Mallampati: I  TM Distance: >3 FB     Dental  (+) Teeth Intact   Pulmonary  breath sounds clear to auscultation        Cardiovascular hypertension, Rhythm:Regular     Neuro/Psych  Neuromuscular disease (peripheral neuropathy)    GI/Hepatic   Endo/Other  diabetes, Type 2, Oral Hypoglycemic Agents  Renal/GU      Musculoskeletal  (+) Arthritis -,   Abdominal   Peds  Hematology   Anesthesia Other Findings   Reproductive/Obstetrics                             Anesthesia Physical Anesthesia Plan  ASA: III  Anesthesia Plan: MAC   Post-op Pain Management:    Induction: Intravenous  Airway Management Planned: Simple Face Mask  Additional Equipment:   Intra-op Plan:   Post-operative Plan:   Informed Consent: I have reviewed the patients History and Physical, chart, labs and discussed the procedure including the risks, benefits and alternatives for the proposed anesthesia with the patient or authorized representative who has indicated his/her understanding and acceptance.     Plan Discussed with:   Anesthesia Plan Comments:         Anesthesia Quick Evaluation  

## 2015-04-01 NOTE — Discharge Instructions (Signed)
Please keep dressing clean, dry, intact and do not get wet.  Keep the pin clean, dry, and protected.  Ice and elevate surgical extremity on top of foot or behind the knee for 30 minutes on and 30 minutes off.  Non weight bear to the right foot with aid of surgical shoe and walker.  Decrease weight bearing activity for only bathroom privileges.  Wear surgical shoe when going to the bathroom otherwise surgical shoe should be off if resting on chair/bed.  Incision Care An incision is when a surgeon cuts into your body tissues. After surgery, the incision needs to be cared for properly to prevent infection.  HOME CARE INSTRUCTIONS   Take all medicine as directed by your caregiver. Only take over-the-counter or prescription medicines for pain, discomfort, or fever as directed by your caregiver.  Do not remove your bandage (dressing) or get your incision wet until your surgeon gives you permission. In the event that your dressing becomes wet, dirty, or starts to smell, change the dressing and call your surgeon for instructions as soon as possible.  Take showers. Do not take tub baths, swim, or do anything that may soak the wound until it is healed.  Resume your normal diet and activities as directed or allowed.  Avoid lifting any weight until you are instructed otherwise.  Use anti-itch antihistamine medicine as directed by your caregiver. The wound may itch when it is healing. Do not pick or scratch at the wound.  Follow up with your caregiver for stitch (suture) or staple removal as directed.  Drink enough fluids to keep your urine clear or pale yellow. SEEK MEDICAL CARE IF:   You have redness, swelling, or increasing pain in the wound that is not controlled with medicine.  You have drainage, blood, or pus coming from the wound that lasts longer than 1 day.  You develop muscle aches, chills, or a general ill feeling.  You notice a bad smell coming from the wound or dressing.  Your  wound edges separate after the sutures, staples, or skin adhesive strips have been removed.  You develop persistent nausea or vomiting. SEEK IMMEDIATE MEDICAL CARE IF:   You have a fever.  You develop a rash.  You develop dizzy episodes or faint while standing.  You have difficulty breathing.  You develop any reaction or side effects to medicine given. MAKE SURE YOU:   Understand these instructions.  Will watch your condition.  Will get help right away if you are not doing well or get worse. Document Released: 05/12/2005 Document Revised: 01/15/2012 Document Reviewed: 12/17/2013 [facility] Patient Information 2015 [place], [place]. This information is not intended to replace advice given to you by your health care provider. Make sure you discuss any questions you have with your health care provider.

## 2015-04-01 NOTE — Op Note (Signed)
OPERATIVE NOTE  DATE OF PROCEDURE:  04/01/2015  SURGEON:   Laurell JosephsSherry Rashidah [place], DPM  OR STAFF:   Circulator: Epimenio Sarinatherine J Page, RN Scrub Person: Nicki Reaperynthia S Wrenn, RN   PREOPERATIVE DIAGNOSIS:   osteomyelitis right foot-right great toe  POSTOPERATIVE DIAGNOSIS: Same  PROCEDURE: Right hallux amputation  PATHOLOGY:   Right hallux  ANESTHESIA:  Monitor Anesthesia Care with 10 mL of one-to-one mix of 1% lidocaine plain and 0.25% Marcaine plain  HEMOSTASIS:   Pneumatic ankle tourniquet set at 250 mmHg  ESTIMATED BLOOD LOSS:   Minimal  MATERIALS USED:  3-0 nylon  INJECTABLES: 10 mL of a one-to-one mix of 1% lidocaine plain and 0.25% Marcaine plain  COMPLICATIONS:   None  DESCRIPTION OF THE PROCEDURE:   The patient was brought to the operating room and placed on the operative table in the supine position.  A pneumatic ankle tourniquet was applied to the patient's ankle.  Following sedation, the surgical site was anesthetized with 10 mL of one-to-one mix of 1% lidocaine plain and 0.25% Marcaine plain.  The foot was then prepped, scrubbed, and draped in the usual sterile technique.  The foot was elevated, exsanguinated and the pneumatic ankle tourniquet inflated to 250 mmHg.    Attention was directed to the right hallux where an ulceration was noted to the distal medial and another ulceration noted to the distal lateral tip of the toe. The wound was extremely malodorous with purulent drainage expressed from the distal medial ulceration. A fishmouth incision was made from the skin to bone. The right hallux was disarticulated at the metatarsophalangeal joint, removed from the operative field, and sent to pathology. The first metatarsal head appeared healthy and viable.  The wound was then irrigated with copious amounts of normal sterile saline. The skin was reapproximated using 3-0 nylon with vertical mattress and simple interrupted suture techniques. A postop injection consisting of 10 mL of a  one-to-one mix of 1% lidocaine plain and 0.25% Marcaine plain was injected. The incision was then dressed with Xeroform, 4 x 4's, Kerlix, and an Ace bandage. The pneumatic ankle tourniquet was deflated and a prompt hyperemic response was noted to all remaining toes of the right foot. The patient tolerated the procedure and anesthesia well. He was transferred to the PACU with vital signs stable and vascular status at preoperative baselines all remaining toes of the right foot. Following a period of postop monitoring the patient will be discharged home on written and oral postoperative instructions.

## 2015-04-01 NOTE — Anesthesia Postprocedure Evaluation (Signed)
  Anesthesia Post-op Note  Patient: [name]  Procedure(s) Performed: Procedure(s): AMPUTATION DIGIT 1ST TOE RIGHT FOOT (Right)  Patient Location: PACU  Anesthesia Type:MAC  Level of Consciousness: awake, alert , oriented and patient cooperative  Airway and Oxygen Therapy: Patient Spontanous Breathing  Post-op Pain: none  Post-op Assessment: Post-op Vital signs reviewed, Patient's Cardiovascular Status Stable, Respiratory Function Stable, Patent Airway and Pain level controlled  Post-op Vital Signs: Reviewed and stable  Last Vitals:  Filed Vitals:   04/01/15 1400  BP: 112/68  Pulse:   Temp:   Resp: 22    Complications: No apparent anesthesia complications

## 2015-04-02 ENCOUNTER — Encounter (HOSPITAL_COMMUNITY): Payer: Self-pay | Admitting: Podiatry

## 2015-04-08 ENCOUNTER — Ambulatory Visit: Payer: Self-pay | Admitting: Family Medicine

## 2015-04-18 ENCOUNTER — Other Ambulatory Visit: Payer: Self-pay | Admitting: Family Medicine

## 2015-04-21 ENCOUNTER — Ambulatory Visit (INDEPENDENT_AMBULATORY_CARE_PROVIDER_SITE_OTHER): Payer: 59 | Admitting: Family Medicine

## 2015-04-21 ENCOUNTER — Encounter: Payer: Self-pay | Admitting: Family Medicine

## 2015-04-21 VITALS — BP 149/95 | HR 89 | Temp 97.7°F | Ht 76.0 in | Wt 223.0 lb

## 2015-04-21 DIAGNOSIS — L97509 Non-pressure chronic ulcer of other part of unspecified foot with unspecified severity: Secondary | ICD-10-CM

## 2015-04-21 DIAGNOSIS — I10 Essential (primary) hypertension: Secondary | ICD-10-CM

## 2015-04-21 DIAGNOSIS — E11621 Type 2 diabetes mellitus with foot ulcer: Secondary | ICD-10-CM | POA: Diagnosis not present

## 2015-04-21 LAB — POCT UA - MICROALBUMIN: Microalbumin Ur, POC: NEGATIVE mg/L

## 2015-04-21 LAB — GLUCOSE, POCT (MANUAL RESULT ENTRY): POC Glucose: 292 mg/dl — AB (ref 70–99)

## 2015-04-21 LAB — POCT GLYCOSYLATED HEMOGLOBIN (HGB A1C): Hemoglobin A1C: 10

## 2015-04-21 MED ORDER — AMLODIPINE BESYLATE 5 MG PO TABS: 5.0000 mg | ORAL_TABLET | Freq: Every day | ORAL | Status: DC

## 2015-04-21 NOTE — Progress Notes (Signed)
Subjective:    Patient ID: [name], male    DOB: [birth year], 49 y.o.   MRN: [id number]  HPI 49 year old gentleman with diabetes and hypertension. Recently had amputation of the right great toe secondary to osteomyelitis. Diabetes regimen includes glipizide metformin and Invokana. Sugars range from 150-200 fasting and last A1c was 9.7. That was last checked 10 months ago. At that time lipids were good on no medication. Generally he feels well and attitudes seems good.  Patient Active Problem List   Diagnosis Date Noted  . Splinter 06/18/2014  . Osteomyelitis of toe of right foot 06/13/2014  . Osteomyelitis 06/12/2014  . Neuropathy in diabetes 03/31/2014  . Charcot ankle 03/09/2014  . Essential hypertension, benign 03/03/2013  . Diabetes 03/03/2013  . DDD (degenerative disc disease), lumbar 03/03/2013   Outpatient Encounter Prescriptions as of 04/21/2015  Medication Sig  . aspirin (ASPIRIN EC) 81 MG EC tablet Take 81 mg by mouth daily. Swallow whole.  . gabapentin (NEURONTIN) 300 MG capsule Take 600 mg by mouth daily.   Marland Kitchen glipiZIDE (GLUCOTROL) 5 MG tablet Take 5 mg by mouth daily before breakfast.  . glucose blood (BAYER CONTOUR TEST) test strip TEST BLOOD SUGAR UP TO TWICE DAILY  . lisinopril (PRINIVIL,ZESTRIL) 20 MG tablet TAKE 1 TABLET (20 MG TOTAL) BY MOUTH DAILY.  . metFORMIN (GLUCOPHAGE) 1000 MG tablet TAKE 1 TABLET TWICE A DAY  . Multiple Vitamin (MULTIVITAMIN WITH MINERALS) TABS tablet Take 1 tablet by mouth daily.  . canagliflozin (INVOKANA) 300 MG TABS tablet Take 300 mg by mouth daily before breakfast.   . HYDROcodone-acetaminophen (NORCO) 10-325 MG per tablet Take 1 tablet by mouth every 6 (six) hours as needed. (Patient not taking: Reported on 04/21/2015)  . ibuprofen (ADVIL,MOTRIN) 800 MG tablet Take 1 tablet (800 mg total) by mouth every 8 (eight) hours as needed for pain. (Patient not taking: Reported on 04/21/2015)   No facility-administered encounter medications  on file as of 04/21/2015.      Review of Systems  Constitutional: Negative.   HENT: Negative.   Eyes: Negative.   Respiratory: Negative.  Negative for shortness of breath.   Cardiovascular: Negative.  Negative for chest pain and leg swelling.  Gastrointestinal: Negative.   Genitourinary: Negative.   Musculoskeletal: Negative.   Skin: Negative.   Neurological: Negative.   Psychiatric/Behavioral: Negative.   All other systems reviewed and are negative.      Objective:   Physical Exam  Constitutional: He is oriented to person, place, and time. He appears well-developed and well-nourished.  Cardiovascular: Normal rate and regular rhythm.   Pulmonary/Chest: Effort normal and breath sounds normal.  Neurological: He is alert and oriented to person, place, and time.  Psychiatric: He has a normal mood and affect.     BP 149/95 mmHg  Pulse 89  Temp(Src) 97.7 F (36.5 C) (Oral)  Ht $R'6\' 4"'yg$  (1.93 m)  Wt 223 lb (101.152 kg)  BMI 27.16 kg/m2      Assessment & Plan:  1. Type 2 diabetes mellitus with foot ulcer We need to do better by his diabetes. He should be taking all his medicines twice a day. I think probably he was taking glipizide once a day. - POCT glycosylated hemoglobin (Hb A1C) - BMP8+EGFR - POCT UA - Microalbumin - Lipid panel  2. Essential hypertension, benign Will keep track of his lipids at least once a year. Add amlodipine 5 mg for better control blood pressure but continue lisinopril    Lillette Boxer  Sabra Heck MD - Lipid panel

## 2015-04-21 NOTE — Patient Instructions (Addendum)
How to Avoid Diabetes Problems You can do a lot to prevent or slow down diabetes problems. Following your diabetes plan and taking care of yourself can reduce your risk of serious or life-threatening complications. Below, you will find certain things you can do to prevent diabetes problems. MANAGE YOUR DIABETES Follow your health care provider's, nurse educator's, and dietitian's instructions for managing your diabetes. They will teach you the basics of diabetes care. They can help answer questions you may have. Learn about diabetes and make healthy choices regarding eating and physical activity. Monitor your blood glucose level regularly. Your health care provider will help you decide how often to check your blood glucose level depending on your treatment goals and how well you are meeting them.  DO NOT USE NICOTINE Nicotine and diabetes are a dangerous combination. Nicotine raises your risk for diabetes problems. If you quit using nicotine, you will lower your risk for heart attack, stroke, nerve disease, and kidney disease. Your cholesterol and your blood pressure levels may improve. Your blood circulation will also improve. Do not use any tobacco products, including cigarettes, chewing tobacco, or electronic cigarettes. If you need help quitting, ask your health care provider. KEEP YOUR BLOOD PRESSURE UNDER CONTROL Keeping your blood pressure under control will help prevent damage to your eyes, kidneys, heart, and blood vessels. Blood pressure consists of two numbers. The top number should be below 120, and the bottom number should be below 80 (120/80). Keep your blood pressure as close to these numbers as you can. If you already have kidney disease, you may want even lower blood pressure to protect your kidneys. Talk to your health care provider to make sure that your blood pressure goal is right for your needs. Meal planning, medicines, and exercise can help you reach your blood pressure target. Have  your blood pressure checked at every visit with your health care provider. KEEP YOUR CHOLESTEROL UNDER CONTROL Normal cholesterol levels will help prevent heart disease and stroke. These are the biggest health problems for people with diabetes. Keeping cholesterol levels under control can also help with blood flow. Have your cholesterol level checked at least once a year. Your health care provider may prescribe a medicine known as a statin. Statins lower your cholesterol. If you are not taking a statin, ask your health care provider if you should be. Meal planning, exercise, and medicines can help you reach your cholesterol targets.  SCHEDULE AND KEEP YOUR ANNUAL PHYSICAL EXAMS AND EYE EXAMS Your health care provider will tell you how often he or she wants to see you depending on your plan of treatment. It is important that you keep these appointments so that possible problems can be identified early and complications can be avoided or treated.  Every visit with your health care provider should include your weight, blood pressure, and an evaluation of your blood glucose control.  Your hemoglobin A1c should be checked:  At least twice a year if you are at your goal.  Every 3 months if there are changes in treatment.  If you are not meeting your goals.  Your blood lipids should be checked yearly. You should also be checked yearly to see if you have protein in your urine (microalbumin).  Schedule a dilated eye exam within 5 years of your diagnosis if you have type 1 diabetes, and then yearly. Schedule a dilated eye exam at diagnosis if you have type 2 diabetes, and then yearly. All exams thereafter can be extended to every 2   to 3 years if one or more exams have been normal. KEEP YOUR VACCINES CURRENT The flu vaccine is recommended yearly. The formula for the vaccine changes every year and needs to be updated for the best protection against current viruses. It is recommended that people with diabetes  who are over 65 years old get the pneumonia vaccine. In some cases, two separate shots may be given. Ask your health care provider if your pneumonia vaccination is up-to-date. However, there are some instances where another vaccine is recommended. Check with your health care provider. TAKE CARE OF YOUR FEET  Diabetes may cause you to have a poor blood supply (circulation) to your legs and feet. Because of this, the skin may be thinner, break easier, and heal more slowly. You also may have nerve damage in your legs and feet, causing decreased feeling. You may not notice minor injuries to your feet that could lead to serious problems or infections. Taking care of your feet is very important. Visual foot exams are performed at every routine medical visit. The exams check for cuts, injuries, or other problems with the feet. A comprehensive foot exam should be done yearly. This includes visual inspection as well as assessing foot pulses and testing for loss of sensation. You should also do the following:  Inspect your feet daily for cuts, calluses, blisters, ingrown toenails, and signs of infection, such as redness, swelling, or pus.  Wash and dry your feet thoroughly, especially between the toes.  Avoid soaking your feet regularly in hot water baths.  Moisturize dry skin with lotion, avoiding areas between your toes.  Cut toenails straight across and file the edges.  Avoid shoes that do not fit well or have areas that irritate your skin.  Avoid going barefooted or wearing only socks. Your feet need protection. TAKE CARE OF YOUR TEETH People with poorly controlled diabetes are more likely to have gum (periodontal) disease. These infections make diabetes harder to control. Periodontal diseases, if left untreated, can lead to tooth loss. Brush your teeth twice a day, floss, and see your dentist for checkups and cleaning every 6 months, or 2 times a year. ASK YOUR HEALTH CARE PROVIDER ABOUT TAKING  ASPIRIN Taking aspirin daily is recommended to help prevent cardiovascular disease in people with and without diabetes. Ask your health care provider if this would benefit you and what dose he or she would recommend. DRINK RESPONSIBLY Moderate amounts of alcohol (less than 1 drink per day for adult women and less than 2 drinks per day for adult men) have a minimal effect on blood glucose if ingested with food. It is important to eat food with alcohol to avoid hypoglycemia. People should avoid alcohol if they have a history of alcohol abuse or dependence, if they are pregnant, and if they have liver disease, pancreatitis, advanced neuropathy, or severe hypertriglyceridemia. LESSEN STRESS Living with diabetes can be stressful. When you are under stress, your blood glucose may be affected in two ways:  Stress hormones may cause your blood glucose to rise.  You may be distracted from taking good care of yourself. It is a good idea to be aware of your stress level and make changes that are necessary to help you better manage challenging situations. Support groups, planned relaxation, a hobby you enjoy, meditation, healthy relationships, and exercise all work to lower your stress level. If your efforts do not seem to be helping, get help from your health care provider or a trained mental health professional. Document   Released: 07/11/2011 Document Revised: 03/09/2014 Document Reviewed: 12/17/2013 [employer] Patient Information 2015 [employer]. This information is not intended to replace advice given to you by your health care provider. Make sure you discuss any questions you have with your health care provider.  

## 2015-04-21 NOTE — Addendum Note (Signed)
Addended by: Tommas Olp on: 04/21/2015 09:22 AM   Modules accepted: Orders

## 2015-04-22 LAB — LIPID PANEL
CHOLESTEROL TOTAL: 154 mg/dL (ref 100–199)
Chol/HDL Ratio: 5.3 ratio units — ABNORMAL HIGH (ref 0.0–5.0)
HDL: 29 mg/dL — ABNORMAL LOW (ref >39)
LDL CALC: 67 mg/dL (ref 0–99)
TRIGLYCERIDES: 292 mg/dL — AB (ref 0–149)
VLDL CHOLESTEROL CAL: 58 mg/dL — AB (ref 5–40)

## 2015-04-22 LAB — BMP8+EGFR
BUN / CREAT RATIO: 19 (ref 9–20)
BUN: 16 mg/dL (ref 6–24)
CALCIUM: 9.9 mg/dL (ref 8.7–10.2)
CO2: 26 mmol/L (ref 18–29)
CREATININE: 0.85 mg/dL (ref 0.76–1.27)
Chloride: 96 mmol/L — ABNORMAL LOW (ref 97–108)
GFR calc Af Amer: 117 mL/min/{1.73_m2} (ref >59)
GFR calc non Af Amer: 102 mL/min/{1.73_m2} (ref >59)
Glucose: 276 mg/dL — ABNORMAL HIGH (ref 65–99)
Potassium: 5 mmol/L (ref 3.5–5.2)
Sodium: 139 mmol/L (ref 134–144)

## 2015-04-27 ENCOUNTER — Ambulatory Visit: Payer: Self-pay

## 2015-05-06 ENCOUNTER — Encounter: Payer: Self-pay | Admitting: Pharmacist

## 2015-05-06 ENCOUNTER — Ambulatory Visit (INDEPENDENT_AMBULATORY_CARE_PROVIDER_SITE_OTHER): Payer: 59 | Admitting: Pharmacist

## 2015-05-06 VITALS — BP 124/78 | HR 77 | Ht 76.0 in | Wt 227.5 lb

## 2015-05-06 DIAGNOSIS — E1169 Type 2 diabetes mellitus with other specified complication: Secondary | ICD-10-CM

## 2015-05-06 DIAGNOSIS — G629 Polyneuropathy, unspecified: Secondary | ICD-10-CM | POA: Diagnosis not present

## 2015-05-06 DIAGNOSIS — I10 Essential (primary) hypertension: Secondary | ICD-10-CM

## 2015-05-06 DIAGNOSIS — L97509 Non-pressure chronic ulcer of other part of unspecified foot with unspecified severity: Secondary | ICD-10-CM | POA: Diagnosis not present

## 2015-05-06 DIAGNOSIS — E785 Hyperlipidemia, unspecified: Secondary | ICD-10-CM | POA: Diagnosis not present

## 2015-05-06 DIAGNOSIS — E11621 Type 2 diabetes mellitus with foot ulcer: Secondary | ICD-10-CM

## 2015-05-06 MED ORDER — PEN NEEDLES 32G X 4 MM MISC: 1.0000 | Freq: Every day | Status: DC

## 2015-05-06 MED ORDER — INSULIN GLARGINE 100 UNIT/ML SOLOSTAR PEN: 10.0000 [IU] | PEN_INJECTOR | Freq: Every day | SUBCUTANEOUS | Status: DC

## 2015-05-06 NOTE — Progress Notes (Signed)
Subjective:    [name] is a 49 y.o. male who presents for evaluation of Type 2 diabetes mellitus.  It has been about 3 years since I last saw [name] when he was initially diagnosed with type 2 diabetes.   Until the last year his BG was well controlled.  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  Current medications for diabetes are Metformin  bid, glipizide  1 tablet qd and Invokana  1 tablet qd.   [name] states that he does not think he could ever give himself an insulin shot or injection.  He is frightened of injections.  Current symptoms/problems include foot ulcerations, hyperglycemia and polydipsia and have been worsening.   Known diabetic complications: neuropathy, toe amputations Cardiovascular risk factors: advanced age (older than 70 for men, 79 for women), diabetes mellitus, dyslipidemia, hypertension, male gender and sedentary lifestyle  Eye exam current (within one year): no patient does have appt for 2 weeks Weight trend: increased 4# over last month  Current diet: in general, an "unhealthy" diet Current exercise: none  Patient c/o fatigue and drowsiness at work - he works 3rd shift and thinks its related to shift changes.  When reviewing meds though patient states he is taking gabapentin prior to work.  Current monitoring regimen: home blood tests - 1 to 2 times daily Home blood sugar records: fasting range: 150 to 250 Any episodes of hypoglycemia? no  Is He on ACE inhibitor or angiotensin II receptor blocker?  Yes  lisinopril (Prinivil)    The following portions of the patient's history were reviewed and updated as appropriate: allergies, current medications, past family history, past medical history, past social history, past surgical history and problem list.   Objective:    BP 124/78 mmHg  Pulse 77  Ht  (1.93 m)  Wt 227 lb 8 oz (103.193 kg)  BMI 27.70  kg/m2  A1c = 10% (04/21/2015)  Lab Review GLUCOSE (mg/dL)  Date Value  16/08/9603 276*  03/09/2014 334*   GLUCOSE, BLD (mg/dL)  Date Value  54/07/8118 301*  09/01/2014 106*  06/18/2014 139*   CO2  Date Value  04/21/2015 26 mmol/L  03/31/2015 28 mmol/L  09/01/2014 27 mEq/L   BUN (mg/dL)  Date Value  14/78/2956 16  03/31/2015 16  09/01/2014 17  06/18/2014 13  03/09/2014 14   CREATININE, SER (mg/dL)  Date Value  21/30/8657 0.85  03/31/2015 1.04  03/29/2015 0.90    Assessment:    Diabetes Mellitus type II, under inadequate control.   Fatigue and Drowsiness during waking hours - possibly related to elevated BG or timing of gabapentin.  Neuropathy HTN - controlled Hyperlipidemia - LDL was at goal but Tg elevated - likely related to diet and uncontrolled DM. Plan:    1.  Rx changes: Lantus inject 10 units daily for 1 week - increase by 2 units every 7 days while am FBG is 150 of greater.  First injection is given in office and patient is agreeable to injecting insulin with pen once he sees it is simple.    Recommended that patient switch gabapentin from taking before work to before time of sleep to decrease fatigue and drowsiness at work. 2.  Education: Reviewed 'ABCs' of diabetes management (respective goals in parentheses):  A1C (<7), blood pressure (<130/80), and cholesterol (LDL <100). 3.  Compliance at present is estimated to be fair. Efforts to  improve compliance (if necessary) will be directed at dietary modifications: reviewed CHO counting and serving sizes.  Patient is to limit CHO to 50 to 55 grams per meal and 20 grams per snack and increased exercise. 4. Follow up: 4 weeks

## 2015-05-06 NOTE — Patient Instructions (Signed)
Start Lantus insulin - inject 10 units once a day.  Check blood glucose daily when you get up.  If your blood glucose readings after 1 week are over 150 then increase by 2 units to 12 units daily.  Continue to increase weekly by 2 units until your blood glucose readings upon arising are less than 150.  Change gabapentin and take prior to when you go to sleep to help with drowsiness.   Diabetes and Standards of Medical Care   Diabetes is complicated. You may find that your diabetes team includes a dietitian, nurse, diabetes educator, eye doctor, and more. To help everyone know what is going on and to help you get the care you deserve, the following schedule of care was developed to help keep you on track. Below are the tests, exams, vaccines, medicines, education, and plans you will need.  Blood Glucose Goals Prior to meals = 80 - 130 Within 2 hours of the start of a meal = less than 180  HbA1c test (goal is less than 7.0% - your last value was 10.0%) This test shows how well you have controlled your glucose over the past 2 to 3 months. It is used to see if your diabetes management plan needs to be adjusted.   It is performed at least 2 times a year if you are meeting treatment goals.  It is performed 4 times a year if therapy has changed or if you are not meeting treatment goals.  Blood pressure test  This test is performed at every routine medical visit. The goal is less than 140/90 mmHg for most people, but 130/80 mmHg in some cases. Ask your health care provider about your goal.  Dental exam  Follow up with the dentist regularly.  Eye exam  If you are diagnosed with type 1 diabetes as a child, get an exam upon reaching the age of 58 years or older and have had diabetes for 3 to 5 years. Yearly eye exams are recommended after that initial eye exam.  If you are diagnosed with type 1 diabetes as an adult, get an exam within 5 years of diagnosis and then yearly.  If you are diagnosed  with type 2 diabetes, get an exam as soon as possible after the diagnosis and then yearly.  Foot care exam  Visual foot exams are performed at every routine medical visit. The exams check for cuts, injuries, or other problems with the feet.  A comprehensive foot exam should be done yearly. This includes visual inspection as well as assessing foot pulses and testing for loss of sensation.  Check your feet nightly for cuts, injuries, or other problems with your feet. Tell your health care provider if anything is not healing.  Kidney function test (urine microalbumin)  This test is performed once a year.  Type 1 diabetes: The first test is performed 5 years after diagnosis.  Type 2 diabetes: The first test is performed at the time of diagnosis.  A serum creatinine and estimated glomerular filtration rate (eGFR) test is done once a year to assess the level of chronic kidney disease (CKD), if present.  Lipid profile (cholesterol, HDL, LDL, triglycerides)  Performed every 5 years for most people.  The goal for LDL is less than 100 mg/dL. If you are at high risk, the goal is less than 70 mg/dL.  The goal for HDL is 40 mg/dL to 50 mg/dL for men and 50 mg/dL to 60 mg/dL for women. An HDL cholesterol  of 60 mg/dL or higher gives some protection against heart disease.  The goal for triglycerides is less than 150 mg/dL.  Influenza vaccine, pneumococcal vaccine, and hepatitis B vaccine  The influenza vaccine is recommended yearly.  The pneumococcal vaccine is generally given once in a lifetime. However, there are some instances when another vaccination is recommended. Check with your health care provider.  The hepatitis B vaccine is also recommended for adults with diabetes.  Diabetes self-management education  Education is recommended at diagnosis and ongoing as needed.  Treatment plan  Your treatment plan is reviewed at every medical visit.  Document Released: 08/20/2009 Document  Revised: 06/25/2013 Document Reviewed: 03/25/2013 [facility] Patient Information 2014 [place].

## 2015-06-07 ENCOUNTER — Encounter (INDEPENDENT_AMBULATORY_CARE_PROVIDER_SITE_OTHER): Payer: Self-pay

## 2015-06-07 ENCOUNTER — Ambulatory Visit (INDEPENDENT_AMBULATORY_CARE_PROVIDER_SITE_OTHER): Payer: 59 | Admitting: Pharmacist

## 2015-06-07 VITALS — BP 132/80 | HR 80 | Ht 76.0 in | Wt 226.0 lb

## 2015-06-07 DIAGNOSIS — G629 Polyneuropathy, unspecified: Secondary | ICD-10-CM

## 2015-06-07 DIAGNOSIS — E11621 Type 2 diabetes mellitus with foot ulcer: Secondary | ICD-10-CM

## 2015-06-07 DIAGNOSIS — E1169 Type 2 diabetes mellitus with other specified complication: Secondary | ICD-10-CM

## 2015-06-07 DIAGNOSIS — L97509 Non-pressure chronic ulcer of other part of unspecified foot with unspecified severity: Secondary | ICD-10-CM | POA: Diagnosis not present

## 2015-06-07 DIAGNOSIS — E785 Hyperlipidemia, unspecified: Secondary | ICD-10-CM | POA: Diagnosis not present

## 2015-06-07 MED ORDER — CEPHALEXIN 500 MG PO CAPS: 500.0000 mg | ORAL_CAPSULE | Freq: Three times a day (TID) | ORAL | Status: DC

## 2015-06-07 NOTE — Patient Instructions (Signed)
Diabetes and Standards of Medical Care   Diabetes is complicated. You may find that your diabetes team includes a dietitian, nurse, diabetes educator, eye doctor, and more. To help everyone know what is going on and to help you get the care you deserve, the following schedule of care was developed to help keep you on track. Below are the tests, exams, vaccines, medicines, education, and plans you will need.  Blood Glucose Goals Prior to meals = 80 - 130 Within 2 hours of the start of a meal = less than 180  HbA1c test (goal is less than 7.0% - your last value was 10.0%) This test shows how well you have controlled your glucose over the past 2 to 3 months. It is used to see if your diabetes management plan needs to be adjusted.   It is performed at least 2 times a year if you are meeting treatment goals.  It is performed 4 times a year if therapy has changed or if you are not meeting treatment goals.  Blood pressure test  This test is performed at every routine medical visit. The goal is less than 140/90 mmHg for most people, but 130/80 mmHg in some cases. Ask your health care provider about your goal.  Dental exam  Follow up with the dentist regularly.  Eye exam  If you are diagnosed with type 1 diabetes as a child, get an exam upon reaching the age of 28 years or older and have had diabetes for 3 to 5 years. Yearly eye exams are recommended after that initial eye exam.  If you are diagnosed with type 1 diabetes as an adult, get an exam within 5 years of diagnosis and then yearly.  If you are diagnosed with type 2 diabetes, get an exam as soon as possible after the diagnosis and then yearly.  Foot care exam  Visual foot exams are performed at every routine medical visit. The exams check for cuts, injuries, or other problems with the feet.  A comprehensive foot exam should be done yearly. This includes visual inspection as well as assessing foot pulses and testing for loss of  sensation.  Check your feet nightly for cuts, injuries, or other problems with your feet. Tell your health care provider if anything is not healing.  Kidney function test (urine microalbumin)  This test is performed once a year.  Type 1 diabetes: The first test is performed 5 years after diagnosis.  Type 2 diabetes: The first test is performed at the time of diagnosis.  A serum creatinine and estimated glomerular filtration rate (eGFR) test is done once a year to assess the level of chronic kidney disease (CKD), if present.  Lipid profile (cholesterol, HDL, LDL, triglycerides)  Performed every 5 years for most people.  The goal for LDL is less than 100 mg/dL. If you are at high risk, the goal is less than 70 mg/dL.  The goal for HDL is 40 mg/dL to 50 mg/dL for men and 50 mg/dL to 60 mg/dL for women. An HDL cholesterol of 60 mg/dL or higher gives some protection against heart disease.  The goal for triglycerides is less than 150 mg/dL.  Influenza vaccine, pneumococcal vaccine, and hepatitis B vaccine  The influenza vaccine is recommended yearly.  The pneumococcal vaccine is generally given once in a lifetime. However, there are some instances when another vaccination is recommended. Check with your health care provider.  The hepatitis B vaccine is also recommended for adults with diabetes.  Diabetes self-management education  Education is recommended at diagnosis and ongoing as needed.  Treatment plan  Your treatment plan is reviewed at every medical visit.  Document Released: 08/20/2009 Document Revised: 06/25/2013 Document Reviewed: 03/25/2013 [employer] Patient Information 2014 [employer].   

## 2015-06-07 NOTE — Progress Notes (Signed)
Subjective:    [name] is a 49 y.o. male who presents for evaluation of Type 2 diabetes mellitus.  I last saw [name] about 1 month ago when Lantus insulin was initiated.  He at first was not sure he could self administer injections but he states that he has not had any problems.  He states that his fatigue has improved greatly and the he feels better than he has in a long time.  He reports less neuropathy and has not been taking gabapentin regularly as he has not needed.   He was initially diagnosed with DM about 3 years ago and until the last year his BG was well controlled.  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 Patient reports that he is checking feet daily.  Current medications for diabetes are Lantus 12 units daily, Metformin  bid, glipizide  1 tablet qd and Invokana  1 tablet qd.    Current symptoms/problems include foot ulcerations, hyperglycemia and polydipsia and have been improving over the last month.  Patient does have a boil on the wrist of his right arm.  It has been present for about 3 weeks and per patient is improving.  He is applying polysporin and perioxide to it.   Known diabetic complications: neuropathy, toe amputations Cardiovascular risk factors: advanced age (older than 47 for men, 65 for women), diabetes mellitus, dyslipidemia, hypertension, male gender and sedentary lifestyle  Eye exam current (within one year): yes  Weight trend: decreased 2# over last month  Current diet: in general, a "healthy" diet  , patient is no longer drinking drinks with sugar,  he has increased his vegetable and salad intake.   Current exercise: none   Current monitoring regimen: home blood tests - 1 to 2 times daily Home blood sugar records: fasting range: 125 - 150 Any episodes of hypoglycemia? no  Is He on ACE inhibitor or angiotensin II receptor blocker?  Yes  lisinopril  (Prinivil)   The following portions of the patient's history were reviewed and updated as appropriate: allergies, current medications, past family history, past medical history, past social history, past surgical history and problem list.   Objective:    There were no vitals taken for this visit.  A1c = 10% (04/21/2015)  Lab Review GLUCOSE (mg/dL)  Date Value  16/08/9603 276*  03/09/2014 334*   GLUCOSE, BLD (mg/dL)  Date Value  54/07/8118 301*  09/01/2014 106*  06/18/2014 139*   CO2  Date Value  04/21/2015 26 mmol/L  03/31/2015 28 mmol/L  09/01/2014 27 mEq/L   BUN (mg/dL)  Date Value  14/78/2956 16  03/31/2015 16  09/01/2014 17  06/18/2014 13  03/09/2014 14   CREATININE, SER (mg/dL)  Date Value  21/30/8657 0.85  03/31/2015 1.04  03/29/2015 0.90    Assessment:    Diabetes Mellitus type II, under improving control.   Fatigue and Drowsiness - improved over last month with better BG and stopping gabapentin Neuropathy - improved with better BG control HTN - controlled Hyperlipidemia - LDL was at goal but Tg elevated - likely related to diet and uncontrolled DM. Folliculitis  Plan:    1.  Rx changes:   Continue Lantus inject 12 units daily, metformin  bid, Invokana  daily and glipizide  qam  Recommended that patient continue gabapentin prn neuropathic pain  Cephalexin  1 capsule tid for 7 days. 2.  Education: Reviewed 'ABCs'  of diabetes management (respective goals in parentheses):  A1C (<7), blood pressure (<130/80), and cholesterol (LDL <100). 3.  Compliance at present is estimated to be good:  Continue to limit high CHO foods and sugar containing drinks.  Also continue to incrase vegetables.    Recommended increase physical activity 150 minutes weekly. 4. Follow up: 6 weeks.   Henrene Pastor, PharmD, CPP, CDE

## 2015-06-21 ENCOUNTER — Other Ambulatory Visit: Payer: Self-pay | Admitting: *Deleted

## 2015-06-21 MED ORDER — CANAGLIFLOZIN 300 MG PO TABS: 300.0000 mg | ORAL_TABLET | Freq: Every day | ORAL | Status: DC

## 2015-07-22 ENCOUNTER — Telehealth: Payer: Self-pay | Admitting: Pharmacist

## 2015-07-22 ENCOUNTER — Ambulatory Visit (INDEPENDENT_AMBULATORY_CARE_PROVIDER_SITE_OTHER): Payer: 59 | Admitting: Pharmacist

## 2015-07-22 ENCOUNTER — Encounter: Payer: Self-pay | Admitting: Pharmacist

## 2015-07-22 VITALS — BP 128/88 | HR 78 | Ht 76.0 in | Wt 214.0 lb

## 2015-07-22 DIAGNOSIS — E119 Type 2 diabetes mellitus without complications: Secondary | ICD-10-CM | POA: Insufficient documentation

## 2015-07-22 DIAGNOSIS — E1169 Type 2 diabetes mellitus with other specified complication: Secondary | ICD-10-CM | POA: Diagnosis not present

## 2015-07-22 DIAGNOSIS — E785 Hyperlipidemia, unspecified: Secondary | ICD-10-CM

## 2015-07-22 DIAGNOSIS — E1140 Type 2 diabetes mellitus with diabetic neuropathy, unspecified: Secondary | ICD-10-CM | POA: Diagnosis not present

## 2015-07-22 DIAGNOSIS — S91301S Unspecified open wound, right foot, sequela: Secondary | ICD-10-CM

## 2015-07-22 DIAGNOSIS — I10 Essential (primary) hypertension: Secondary | ICD-10-CM

## 2015-07-22 LAB — POCT GLYCOSYLATED HEMOGLOBIN (HGB A1C): Hemoglobin A1C: 9.4

## 2015-07-22 MED ORDER — CEPHALEXIN 500 MG PO CAPS: 500.0000 mg | ORAL_CAPSULE | Freq: Three times a day (TID) | ORAL | Status: DC

## 2015-07-22 NOTE — Telephone Encounter (Signed)
appt made for 08/04/2015 with Dr Hyacinth Meeker.  Patient has enough medication to last until appt.

## 2015-07-22 NOTE — Patient Instructions (Signed)
Increase lantus to 15 units once a day.  If after 5 days day your fasting blood glucose in the morning (when awaking) is over 120 then increase to 17 units.   Call office if you experience any low blood glucose readings (less than 70) call office for adjustment -  [phone]) or send a message through My Chart if you have further questions.   Hypoglycemia Hypoglycemia occurs when the glucose in your blood is too low. Glucose is a type of sugar that is your body's main energy source. Hormones, such as insulin and glucagon, control the level of glucose in the blood. Insulin lowers blood glucose and glucagon increases blood glucose. Having too much insulin in your blood stream, or not eating enough food containing sugar, can result in hypoglycemia. Hypoglycemia can happen to people with or without diabetes. It can develop quickly and can be a medical emergency.  CAUSES   Missing or delaying meals.  Not eating enough carbohydrates at meals.  Taking too much diabetes medicine.  Not timing your oral diabetes medicine or insulin doses with meals, snacks, and exercise.  Nausea and vomiting.  Certain medicines.  Severe illnesses, such as hepatitis, kidney disorders, and certain eating disorders.  Increased activity or exercise without eating something extra or adjusting medicines.  Drinking too much alcohol.  A nerve disorder that affects body functions like your heart rate, blood pressure, and digestion (autonomic neuropathy).  A condition where the stomach muscles do not function properly (gastroparesis). Therefore, medicines and food may not absorb properly.  Rarely, a tumor of the pancreas can produce too much insulin. SYMPTOMS   Hunger.  Sweating (diaphoresis).  Change in body temperature.  Shakiness.  Headache.  Anxiety.  Lightheadedness.  Irritability.  Difficulty concentrating.  Dry mouth.  Tingling or numbness in the hands or feet.  Restless sleep or sleep  disturbances.  Altered speech and coordination.  Change in mental status.  Seizures or prolonged convulsions.  Combativeness.  Drowsiness (lethargic).  Weakness.  Increased heart rate or palpitations.  Confusion.  Pale, gray skin color.  Blurred or double vision.  Fainting. DIAGNOSIS  A physical exam and medical history will be performed. Your caregiver may make a diagnosis based on your symptoms. Blood tests and other lab tests may be performed to confirm a diagnosis. Once the diagnosis is made, your caregiver will see if your signs and symptoms go away once your blood glucose is raised.  TREATMENT  Usually, you can easily treat your hypoglycemia when you notice symptoms.  Check your blood glucose. If it is less than 70 mg/dl, take one of the following:   3-4 glucose tablets.    cup juice.    cup regular soda.   1 cup skim milk.   -1 tube of glucose gel.   5-6 hard candies.   Avoid high-fat drinks or food that may delay a rise in blood glucose levels.  Do not take more than the recommended amount of sugary foods, drinks, gel, or tablets. Doing so will cause your blood glucose to go too high.   Wait 10-15 minutes and recheck your blood glucose. If it is still less than 70 mg/dl or below your target range, repeat treatment.   Eat a snack if it is more than 1 hour until your next meal.  There may be a time when your blood glucose may go so low that you are unable to treat yourself at home when you start to notice symptoms. You may need someone to  help you. You may even faint or be unable to swallow. If you cannot treat yourself, someone will need to bring you to the hospital.  [place]  If you have diabetes, follow your diabetes management plan by:  Taking your medicines as directed.  Following your exercise plan.  Following your meal plan. Do not skip meals. Eat on time.  Testing your blood glucose regularly. Check your blood  glucose before and after exercise. If you exercise longer or different than usual, be sure to check blood glucose more frequently.  Wearing your medical alert jewelry that says you have diabetes.  Identify the cause of your hypoglycemia. Then, develop ways to prevent the recurrence of hypoglycemia.  Do not take a hot bath or shower right after an insulin shot.  Always carry treatment with you. Glucose tablets are the easiest to carry.  If you are going to drink alcohol, drink it only with meals.  Tell friends or family members ways to keep you safe during a seizure. This may include removing hard or sharp objects from the area or turning you on your side.  Maintain a healthy weight. SEEK MEDICAL CARE IF:   You are having problems keeping your blood glucose in your target range.  You are having frequent episodes of hypoglycemia.  You feel you might be having side effects from your medicines.  You are not sure why your blood glucose is dropping so low.  You notice a change in vision or a new problem with your vision. SEEK IMMEDIATE MEDICAL CARE IF:   Confusion develops.  A change in mental status occurs.  The inability to swallow develops.  Fainting occurs. Document Released: 10/23/2005 Document Revised: 10/28/2013 Document Reviewed: 02/19/2012 [facility] Patient Information 2015 [place], [place]. This information is not intended to replace advice given to you by your health care provider. Make sure you discuss any questions you have with your health care provider.   Diabetes and Standards of Medical Care   Diabetes is complicated. You may find that your diabetes team includes a dietitian, nurse, diabetes educator, eye doctor, and more. To help everyone know what is going on and to help you get the care you deserve, the following schedule of care was developed to help keep you on track. Below are the tests, exams, vaccines, medicines, education, and plans you will need.  Blood  Glucose Goals Prior to meals = 80 - 130 Within 2 hours of the start of a meal = less than 180  HbA1c test (goal is less than 7.0% - your last value was 9.4% - down from 10%) This test shows how well you have controlled your glucose over the past 2 to 3 months. It is used to see if your diabetes management plan needs to be adjusted.   It is performed at least 2 times a year if you are meeting treatment goals.  It is performed 4 times a year if therapy has changed or if you are not meeting treatment goals.  Blood pressure test  This test is performed at every routine medical visit. The goal is less than 140/90 mmHg for most people, but 130/80 mmHg in some cases. Ask your health care provider about your goal.  Dental exam  Follow up with the dentist regularly.  Eye exam  If you are diagnosed with type 1 diabetes as a child, get an exam upon reaching the age of 60 years or older and have had diabetes for 3 to 5 years. Yearly  eye exams are recommended after that initial eye exam.  If you are diagnosed with type 1 diabetes as an adult, get an exam within 5 years of diagnosis and then yearly.  If you are diagnosed with type 2 diabetes, get an exam as soon as possible after the diagnosis and then yearly.  Foot care exam  Visual foot exams are performed at every routine medical visit. The exams check for cuts, injuries, or other problems with the feet.  A comprehensive foot exam should be done yearly. This includes visual inspection as well as assessing foot pulses and testing for loss of sensation.  Check your feet nightly for cuts, injuries, or other problems with your feet. Tell your health care provider if anything is not healing.  Kidney function test (urine microalbumin)  This test is performed once a year.  Type 1 diabetes: The first test is performed 5 years after diagnosis.  Type 2 diabetes: The first test is performed at the time of diagnosis.  A serum creatinine and  estimated glomerular filtration rate (eGFR) test is done once a year to assess the level of chronic kidney disease (CKD), if present.  Lipid profile (cholesterol, HDL, LDL, triglycerides)  Performed every 5 years for most people.  The goal for LDL is less than 100 mg/dL. If you are at high risk, the goal is less than 70 mg/dL.  The goal for HDL is 40 mg/dL to 50 mg/dL for men and 50 mg/dL to 60 mg/dL for women. An HDL cholesterol of 60 mg/dL or higher gives some protection against heart disease.  The goal for triglycerides is less than 150 mg/dL.  Influenza vaccine, pneumococcal vaccine, and hepatitis B vaccine  The influenza vaccine is recommended yearly.  The pneumococcal vaccine is generally given once in a lifetime. However, there are some instances when another vaccination is recommended. Check with your health care provider.  The hepatitis B vaccine is also recommended for adults with diabetes.  Diabetes self-management education  Education is recommended at diagnosis and ongoing as needed.  Treatment plan  Your treatment plan is reviewed at every medical visit.  Document Released: 08/20/2009 Document Revised: 06/25/2013 Document Reviewed: 03/25/2013 [facility] Patient Information 2014 [place].

## 2015-07-22 NOTE — Progress Notes (Signed)
Subjective:    [name] is a 49 y.o. male who presents for evaluation of Type 2 diabetes mellitus.  I last saw [name] about 6 weeks ago.   He has been taking Lantus for about 10 weeks now and has not had any complications.   He states that his fatigue has improved greatly and the he feels better than he has in a long time.   He has not been taking gabapentin regularly as he feels that neuropathy is better.  He works on concrete floors and when he needs something for pain he is taking hydrocodone/APAP 1/2 to 1 tablet as needed for pain.  He asks for Rx today (he last got after foot surgery 04/2015).  He was initially diagnosed with DM about 3 years ago and until the last year his BG was well controlled.  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 Patient reports that he is checking feet daily.  Current medications for diabetes are Lantus 12 units daily, Metformin 1000mg  bid, glipizide 5mg  1 tablet qd and Invokana 300mg  1 tablet qd.    Current symptoms/problems include foot ulcerations, hyperglycemia and polydipsia and have been improving over the last month.  Patient does have a sore his right arm which he states is from an insect bite.  It is located just above his right elbow.  It has been present for about 1 week and per patient is improving.  He is applying polysporin.  There is purulent discharge from area.  Its About the size of a nickel with red borders.  Known diabetic complications: neuropathy, toe amputations.  Patient reports that neuropathy has improved with better BG results. Cardiovascular risk factors: advanced age (older than 46 for men, 39 for women), diabetes mellitus, dyslipidemia, hypertension, male gender and sedentary lifestyle  Eye exam current (within one year): yes  Weight trend: decreased 12# over 6 weeks  Current diet: in general, a "healthy" diet  , patient no longer  drinks beverages  with sugar,  he has increased his vegetable and salad intake.   Current exercise: none but he is doing more yard work for extra work.   Current monitoring regimen: home blood tests - 1 to 2 times daily Home blood sugar records: fasting range: 125 - 150 Any episodes of hypoglycemia? no  Is He on ACE inhibitor or angiotensin II receptor blocker?  Yes  lisinopril (Prinivil)   The following portions of the patient's history were reviewed and updated as appropriate: allergies, current medications, past family history, past medical history, past social history, past surgical history and problem list.   Objective:    BP 128/88 mmHg  Pulse 78  Ht 6\' 4"  (1.93 m)  Wt 214 lb (97.07 kg)  BMI 26.06 kg/m2  A1c = 9.4% (today)  Lab Review GLUCOSE (mg/dL)  Date Value  16/08/9603 276*  03/09/2014 334*   GLUCOSE, BLD (mg/dL)  Date Value  54/07/8118 301*  09/01/2014 106*  06/18/2014 139*   CO2  Date Value  04/21/2015 26 mmol/L  03/31/2015 28 mmol/L  09/01/2014 27 mEq/L   BUN (mg/dL)  Date Value  14/78/2956 16  03/31/2015 16  09/01/2014 17  06/18/2014 13  03/09/2014 14   CREATININE, SER (mg/dL)  Date Value  21/30/8657 0.85  03/31/2015 1.04  03/29/2015 0.90    Assessment:    Diabetes Mellitus type II, under improving control.   Neuropathy  HTN -  controlled Hyperlipidemia - LDL was at goal but Tg elevated - likely related to diet and uncontrolled DM. Overweight - weight has decreased by 12# since last visit.   Plan:    1.  Rx changes:   Increase Lantus inject to 15 units daily,  Continue metformin  bid, Invokana  daily and glipizide  qam  Recommended that patient continue gabapentin prn neuropathic pain.  He is given appt to follow up with PCP regarding chronic pain and treatment.  Continue lisinopril  daily.  Ok to remain off amlodipine  as BP is at goal despite not taking amlodipine for the last 4 to 6 week.s  Cephalexin  1 capsule tid for 7  days. 2.  Education: Reviewed 'ABCs' of diabetes management (respective goals in parentheses):  A1C (<7), blood pressure (<130/80), and cholesterol (LDL <100). 3.  Compliance at present is estimated to be good:  Continue to limit high CHO foods and sugar containing drinks.  Also continue to increase vegetables.    Continue with increase physical activity - try to add in exercise and not just yardwork. Goal is 150 minutes weekly. 4. Follow up: 6 weeks.   Henrene Pastor, PharmD, CPP, CDE

## 2015-07-23 LAB — LIPID PANEL
CHOLESTEROL TOTAL: 122 mg/dL (ref 100–199)
Chol/HDL Ratio: 4.2 ratio units (ref 0.0–5.0)
HDL: 29 mg/dL — ABNORMAL LOW (ref >39)
LDL Calculated: 65 mg/dL (ref 0–99)
Triglycerides: 142 mg/dL (ref 0–149)
VLDL Cholesterol Cal: 28 mg/dL (ref 5–40)

## 2015-07-23 LAB — CMP14+EGFR
ALBUMIN: 3.9 g/dL (ref 3.5–5.5)
ALK PHOS: 166 IU/L — AB (ref 39–117)
ALT: 25 IU/L (ref 0–44)
AST: 18 IU/L (ref 0–40)
Albumin/Globulin Ratio: 1.4 (ref 1.1–2.5)
BILIRUBIN TOTAL: 0.7 mg/dL (ref 0.0–1.2)
BUN / CREAT RATIO: 12 (ref 9–20)
BUN: 11 mg/dL (ref 6–24)
CHLORIDE: 97 mmol/L (ref 97–108)
CO2: 32 mmol/L — ABNORMAL HIGH (ref 18–29)
Calcium: 9.4 mg/dL (ref 8.7–10.2)
Creatinine, Ser: 0.9 mg/dL (ref 0.76–1.27)
GFR calc Af Amer: 115 mL/min/{1.73_m2} (ref >59)
GFR calc non Af Amer: 99 mL/min/{1.73_m2} (ref >59)
GLOBULIN, TOTAL: 2.8 g/dL (ref 1.5–4.5)
GLUCOSE: 285 mg/dL — AB (ref 65–99)
POTASSIUM: 4.8 mmol/L (ref 3.5–5.2)
SODIUM: 137 mmol/L (ref 134–144)
Total Protein: 6.7 g/dL (ref 6.0–8.5)

## 2015-07-23 LAB — MICROALBUMIN / CREATININE URINE RATIO
Creatinine, Urine: 200.3 mg/dL
MICROALB/CREAT RATIO: 90.1 mg/g{creat} — AB (ref 0.0–30.0)
MICROALBUM., U, RANDOM: 180.5 ug/mL

## 2015-07-27 NOTE — Telephone Encounter (Signed)
It looks like Dr Hyacinth Meeker OK'd this but I do not see an Rx printed.  Please complete Rx and have Dr Hyacinth Meeker to sign.  Patient needs to be notified.

## 2015-07-27 NOTE — Addendum Note (Signed)
Addended by: Gwenith Daily on: 07/27/2015 05:01 PM   Modules accepted: Orders

## 2015-07-27 NOTE — Telephone Encounter (Signed)
I called [facility] to verify what the directions on his hydrocodone are and the normal quantity.  Last filled there 03/2015 for hydrocodone 5/300 take 1 every 4 hrs PRN. This isn't what the patient reported. Called patient to clarify and phone was busy x 2 .  Will try again later.

## 2015-07-27 NOTE — Telephone Encounter (Signed)
Okay to refill? 

## 2015-08-04 ENCOUNTER — Ambulatory Visit (INDEPENDENT_AMBULATORY_CARE_PROVIDER_SITE_OTHER): Payer: 59 | Admitting: Family Medicine

## 2015-08-04 ENCOUNTER — Encounter: Payer: Self-pay | Admitting: Family Medicine

## 2015-08-04 VITALS — BP 137/83 | HR 76 | Temp 97.1°F | Ht 76.0 in | Wt 218.0 lb

## 2015-08-04 DIAGNOSIS — M79672 Pain in left foot: Secondary | ICD-10-CM | POA: Diagnosis not present

## 2015-08-04 DIAGNOSIS — S91301S Unspecified open wound, right foot, sequela: Secondary | ICD-10-CM

## 2015-08-04 MED ORDER — AMITRIPTYLINE HCL 25 MG PO TABS: 25.0000 mg | ORAL_TABLET | Freq: Every day | ORAL | Status: DC

## 2015-08-04 MED ORDER — HYDROCODONE-ACETAMINOPHEN 10-325 MG PO TABS: 1.0000 | ORAL_TABLET | Freq: Four times a day (QID) | ORAL | Status: DC | PRN

## 2015-08-04 NOTE — Progress Notes (Signed)
Subjective:    Patient ID: [name], male    DOB: Jan 24, 1966, 49 y.o.   MRN: [id number]  HPI Patient here today for on-going left foot pain. 50 year old with left foot pain. He has multiple issues with the foot. In looking at it it is formed but he also has some diabetic neuropathy. He also has a diagnosis of Charcot ankle. He had osteomyelitis in the right foot and amputation of one toe. We do not think this is related to a heel spur or plantar fasciitis. He has taken hydrocodone in the past with success and only takes it as needed. He doubts the efficacy of gabapentin and I am inclined to agree.       Patient Active Problem List   Diagnosis Date Noted  . Type 2 diabetes mellitus 07/22/2015  . Splinter 06/18/2014  . Osteomyelitis of toe of right foot 06/13/2014  . Osteomyelitis 06/12/2014  . Neuropathy in diabetes 03/31/2014  . Charcot ankle 03/09/2014  . Essential hypertension, benign 03/03/2013  . Diabetes 03/03/2013  . DDD (degenerative disc disease), lumbar 03/03/2013   Outpatient Encounter Prescriptions as of 08/04/2015  Medication Sig  . aspirin (ASPIRIN EC) 81 MG EC tablet Take 81 mg by mouth daily. Swallow whole.  . canagliflozin (INVOKANA) 300 MG TABS tablet Take 300 mg by mouth daily before breakfast.  . cephALEXin (KEFLEX) 500 MG capsule Take 1 capsule (500 mg total) by mouth 3 (three) times daily.  Marland Kitchen gabapentin (NEURONTIN) 300 MG capsule Take 600 mg by mouth daily. Take before sleep  . glipiZIDE (GLUCOTROL) 5 MG tablet Take 5 mg by mouth daily before breakfast.  . glucose blood (BAYER CONTOUR TEST) test strip TEST BLOOD SUGAR UP TO TWICE DAILY  . HYDROcodone-acetaminophen (NORCO) 10-325 MG per tablet Take 1 tablet by mouth every 6 (six) hours as needed.  Marland Kitchen ibuprofen (ADVIL,MOTRIN) 800 MG tablet Take 1 tablet (800 mg total) by mouth every 8 (eight) hours as needed for pain.  . Insulin Glargine (LANTUS SOLOSTAR) 100 UNIT/ML Solostar Pen Inject 10-20 Units into the  skin daily.  . Insulin Pen Needle (PEN NEEDLES) 32G X 4 MM MISC 1 each by Does not apply route daily. Use with Lantus Solostar to inject insulin once daily  . lisinopril (PRINIVIL,ZESTRIL) 20 MG tablet TAKE 1 TABLET (20 MG TOTAL) BY MOUTH DAILY.  . metFORMIN (GLUCOPHAGE) 1000 MG tablet TAKE 1 TABLET TWICE A DAY  . Multiple Vitamin (MULTIVITAMIN WITH MINERALS) TABS tablet Take 1 tablet by mouth daily.   No facility-administered encounter medications on file as of 08/04/2015.      Review of Systems  Constitutional: Negative.   HENT: Negative.   Eyes: Negative.   Respiratory: Negative.   Cardiovascular: Negative.   Gastrointestinal: Negative.   Endocrine: Negative.   Genitourinary: Negative.   Musculoskeletal: Positive for arthralgias (left foot pain).  Skin: Negative.   Allergic/Immunologic: Negative.   Neurological: Negative.   Hematological: Negative.   Psychiatric/Behavioral: Negative.        Objective:   Physical Exam  Constitutional: He appears well-developed and well-nourished.  Cardiovascular: Normal rate and regular rhythm.   Pulmonary/Chest: Effort normal and breath sounds normal.  Musculoskeletal:  Left foot: The foot is flat with a soft tissue protuberance in the instep. Grossly it appears abnormal. There is no particular tenderness over the heel or the plantar fascia. Pain seems to localize more the dorsum and the area of tarsals  Psychiatric: He has a normal mood and affect. Thought content normal.  BP 137/83 mmHg  Pulse 76  Temp(Src) 97.1 F (36.2 C) (Oral)  Ht  (1.93 m)  Wt 218 lb (98.884 kg)  BMI 26.55 kg/m2       Assessment & Plan:  1. Wound, open, foot with complication, right, sequela We'll refill hydrocodone but I would like to taper him off of gabapentin which she feels is not effective and begin amitriptyline 25 mg at bedtime for chronic pain. - HYDROcodone-acetaminophen (NORCO) 10-325 MG tablet; Take 1 tablet by mouth every 6 (six) hours  as needed.  Dispense: 30 tablet; Refill: 0  Frederica Kuster MD

## 2015-08-24 ENCOUNTER — Encounter: Payer: Self-pay | Admitting: Family Medicine

## 2015-09-17 ENCOUNTER — Encounter (HOSPITAL_COMMUNITY): Payer: Self-pay | Admitting: Emergency Medicine

## 2015-09-17 ENCOUNTER — Encounter: Payer: Self-pay | Admitting: Family Medicine

## 2015-09-17 ENCOUNTER — Emergency Department (HOSPITAL_COMMUNITY): Payer: 59

## 2015-09-17 ENCOUNTER — Emergency Department (HOSPITAL_COMMUNITY)
Admission: EM | Admit: 2015-09-17 | Discharge: 2015-09-17 | Disposition: A | Discharge status: Home / Self Care | Payer: 59 | Attending: Emergency Medicine | Admitting: Emergency Medicine

## 2015-09-17 ENCOUNTER — Telehealth: Payer: Self-pay | Admitting: Family Medicine

## 2015-09-17 ENCOUNTER — Ambulatory Visit (INDEPENDENT_AMBULATORY_CARE_PROVIDER_SITE_OTHER): Payer: 59 | Admitting: Family Medicine

## 2015-09-17 VITALS — BP 127/92 | HR 93 | Temp 101.1°F | Ht 76.0 in | Wt 212.6 lb

## 2015-09-17 DIAGNOSIS — I10 Essential (primary) hypertension: Secondary | ICD-10-CM | POA: Insufficient documentation

## 2015-09-17 DIAGNOSIS — G629 Polyneuropathy, unspecified: Secondary | ICD-10-CM | POA: Insufficient documentation

## 2015-09-17 DIAGNOSIS — Z79899 Other long term (current) drug therapy: Secondary | ICD-10-CM | POA: Diagnosis not present

## 2015-09-17 DIAGNOSIS — M869 Osteomyelitis, unspecified: Secondary | ICD-10-CM

## 2015-09-17 DIAGNOSIS — Z1211 Encounter for screening for malignant neoplasm of colon: Secondary | ICD-10-CM

## 2015-09-17 DIAGNOSIS — L03119 Cellulitis of unspecified part of limb: Secondary | ICD-10-CM

## 2015-09-17 DIAGNOSIS — S91301S Unspecified open wound, right foot, sequela: Secondary | ICD-10-CM

## 2015-09-17 DIAGNOSIS — Z880 Allergy status to penicillin: Secondary | ICD-10-CM | POA: Diagnosis not present

## 2015-09-17 DIAGNOSIS — R000 Tachycardia, unspecified: Secondary | ICD-10-CM | POA: Diagnosis not present

## 2015-09-17 DIAGNOSIS — Z7982 Long term (current) use of aspirin: Secondary | ICD-10-CM | POA: Insufficient documentation

## 2015-09-17 DIAGNOSIS — Z794 Long term (current) use of insulin: Secondary | ICD-10-CM | POA: Insufficient documentation

## 2015-09-17 DIAGNOSIS — L03116 Cellulitis of left lower limb: Secondary | ICD-10-CM | POA: Diagnosis not present

## 2015-09-17 DIAGNOSIS — L84 Corns and callosities: Secondary | ICD-10-CM | POA: Diagnosis not present

## 2015-09-17 DIAGNOSIS — R05 Cough: Secondary | ICD-10-CM | POA: Insufficient documentation

## 2015-09-17 DIAGNOSIS — R21 Rash and other nonspecific skin eruption: Secondary | ICD-10-CM | POA: Diagnosis present

## 2015-09-17 DIAGNOSIS — M79671 Pain in right foot: Secondary | ICD-10-CM | POA: Diagnosis not present

## 2015-09-17 DIAGNOSIS — E119 Type 2 diabetes mellitus without complications: Secondary | ICD-10-CM | POA: Diagnosis not present

## 2015-09-17 LAB — BASIC METABOLIC PANEL
ANION GAP: 9 (ref 5–15)
BUN: 19 mg/dL (ref 6–20)
CALCIUM: 9.1 mg/dL (ref 8.9–10.3)
CO2: 26 mmol/L (ref 22–32)
Chloride: 97 mmol/L — ABNORMAL LOW (ref 101–111)
Creatinine, Ser: 0.92 mg/dL (ref 0.61–1.24)
GFR calc non Af Amer: >60 mL/min (ref >60)
GLUCOSE: 234 mg/dL — AB (ref 65–99)
POTASSIUM: 3.6 mmol/L (ref 3.5–5.1)
SODIUM: 132 mmol/L — AB (ref 135–145)

## 2015-09-17 LAB — CBC WITH DIFFERENTIAL/PLATELET
BASOS ABS: 0 10*3/uL (ref 0.0–0.1)
BASOS PCT: 0 %
EOS ABS: 0 10*3/uL (ref 0.0–0.7)
EOS PCT: 0 %
HCT: 44.4 % (ref 39.0–52.0)
Hemoglobin: 16 g/dL (ref 13.0–17.0)
LYMPHS PCT: 16 %
Lymphs Abs: 1.8 10*3/uL (ref 0.7–4.0)
MCH: 31.6 pg (ref 26.0–34.0)
MCHC: 36 g/dL (ref 30.0–36.0)
MCV: 87.7 fL (ref 78.0–100.0)
MONO ABS: 0.9 10*3/uL (ref 0.1–1.0)
Monocytes Relative: 8 %
Neutro Abs: 8.7 10*3/uL — ABNORMAL HIGH (ref 1.7–7.7)
Neutrophils Relative %: 76 %
PLATELETS: 222 10*3/uL (ref 150–400)
RBC: 5.06 MIL/uL (ref 4.22–5.81)
RDW: 12 % (ref 11.5–15.5)
WBC: 11.5 10*3/uL — AB (ref 4.0–10.5)

## 2015-09-17 LAB — CBG MONITORING, ED: Glucose-Capillary: 198 mg/dL — ABNORMAL HIGH (ref 65–99)

## 2015-09-17 MED ORDER — HYDROCODONE-ACETAMINOPHEN 10-325 MG PO TABS: 1.0000 | ORAL_TABLET | Freq: Four times a day (QID) | ORAL | Status: DC | PRN

## 2015-09-17 MED ORDER — VANCOMYCIN HCL 10 G IV SOLR
1500.0000 mg | Freq: Once | INTRAVENOUS | Status: AC
  Administered 2015-09-17: 1500 mg via INTRAVENOUS
  Filled 2015-09-17: qty 1500

## 2015-09-17 MED ORDER — CEPHALEXIN 500 MG PO CAPS: 500.0000 mg | ORAL_CAPSULE | Freq: Four times a day (QID) | ORAL | Status: DC

## 2015-09-17 MED ORDER — SODIUM CHLORIDE 0.9 % IV BOLUS (SEPSIS)
1000.0000 mL | Freq: Once | INTRAVENOUS | Status: AC
  Administered 2015-09-17: 1000 mL via INTRAVENOUS

## 2015-09-17 MED ORDER — SULFAMETHOXAZOLE-TRIMETHOPRIM 800-160 MG PO TABS: 1.0000 | ORAL_TABLET | Freq: Two times a day (BID) | ORAL | Status: AC

## 2015-09-17 MED ORDER — ACETAMINOPHEN 325 MG PO TABS
650.0000 mg | ORAL_TABLET | Freq: Once | ORAL | Status: AC
  Administered 2015-09-17: 650 mg via ORAL
  Filled 2015-09-17: qty 2

## 2015-09-17 MED ORDER — FENTANYL CITRATE (PF) 100 MCG/2ML IJ SOLN
100.0000 ug | Freq: Once | INTRAMUSCULAR | Status: DC
  Filled 2015-09-17: qty 2

## 2015-09-17 NOTE — ED Notes (Addendum)
Pt sent by PCP for possible IV antibiotics. Pt has a ruptured blood vessel on LT interior ankle. Pt noted to have redness and edema spreading from ruptured vessel down to bottom of his LT foot. Pt is a diabetic. Pt able to ambulate. CBG 198 in triage.

## 2015-09-17 NOTE — ED Provider Notes (Addendum)
CSN: [id number]     Arrival date & time 09/17/15  1815 History   First MD Initiated Contact with Patient 09/17/15 1843     Chief Complaint  Patient presents with  . Foot Pain     (Consider location/radiation/quality/duration/timing/severity/associated sxs/prior Treatment) Patient is a 49 y.o. male presenting with rash.  Rash Location:  Foot Foot rash location:  Sole of R foot and sole of L foot Quality: dryness, painful and redness   Quality: not scaling and not swelling   Pain details:    Severity:  Mild   Onset quality:  Gradual   Duration:  6 days   Timing:  Constant   Progression:  Unchanged Severity:  Moderate Onset quality:  Gradual Timing:  Constant Chronicity:  New Associated symptoms: fever   Associated symptoms: no abdominal pain, no shortness of breath and no sore throat     Past Medical History  Diagnosis Date  . Boil     numerous boils in past  . Diabetes mellitus   . Hypertension   . Neuropathy associated with endocrine disorder [facility])    Past Surgical History  Procedure Laterality Date  . Amputation Right 06/17/2014    Procedure: PARTIAL AMPUTATION RIGHT 3RD TOE;  Surgeon: [name], DPM;  Location: [facility];  Service: Podiatry;  Laterality: Right;  . Hernia repair Bilateral     and 3rd hernia repair does not remember ehat side  . Amputation Right 09/02/2014    Procedure: PARTIAL AMPUTATION 2ND TOE RIGHT FOOT;  Surgeon: [name], DPM;  Location: [facility];  Service: Podiatry;  Laterality: Right;  . Amputation Right 04/01/2015    Procedure: AMPUTATION DIGIT 1ST TOE RIGHT FOOT;  Surgeon: Laurell Josephs, DPM;  Location: [facility];  Service: Podiatry;  Laterality: Right;   Family History  Problem Relation Age of Onset  . Stroke Father   . Diabetes Brother   . Stroke Brother   . Diabetes Mother    Social History  Substance Use Topics  . Smoking status: Never Smoker   . Smokeless tobacco: None  . Alcohol Use: No    Review of Systems   Constitutional: Positive for fever. Negative for chills.  HENT: Negative for congestion, drooling, ear pain, facial swelling, sneezing and sore throat.   Respiratory: Positive for cough (intermittent, unchanged). Negative for shortness of breath.   Cardiovascular: Negative for chest pain.  Gastrointestinal: Negative for abdominal pain.  Musculoskeletal: Negative for back pain.  Skin: Positive for rash (bottom of left foot with some calluses, slight pain ).  All other systems reviewed and are negative.     Allergies  Penicillins  Home Medications   Prior to Admission medications   Medication Sig Start Date End Date Taking? Authorizing Provider  amitriptyline (ELAVIL) 25 MG tablet Take 1 tablet (25 mg total) by mouth at bedtime. 08/04/15  Yes Frederica Kuster, MD  aspirin (ASPIRIN EC) 81 MG EC tablet Take 81 mg by mouth daily. Swallow whole.   Yes Historical Provider, MD  canagliflozin (INVOKANA) 300 MG TABS tablet Take 300 mg by mouth daily before breakfast. 06/21/15  Yes Frederica Kuster, MD  gabapentin (NEURONTIN) 300 MG capsule Take 600 mg by mouth daily. Take before sleep   Yes Historical Provider, MD  glipiZIDE (GLUCOTROL) 5 MG tablet Take 5 mg by mouth daily before breakfast.   Yes Historical Provider, MD  Insulin Glargine (LANTUS SOLOSTAR) 100 UNIT/ML Solostar Pen Inject 10-20 Units into the skin daily. Patient taking differently: Inject  16 Units into the skin daily.  05/06/15  Yes Tammy Eckard, PHARMD  lisinopril (PRINIVIL,ZESTRIL) 20 MG tablet TAKE 1 TABLET (20 MG TOTAL) BY MOUTH DAILY. 04/19/15  Yes Frederica KusterStephen M Miller, MD  metFORMIN (GLUCOPHAGE) 1000 MG tablet TAKE 1 TABLET TWICE A DAY 04/19/15  Yes Frederica KusterStephen M Miller, MD  Multiple Vitamin (MULTIVITAMIN WITH MINERALS) TABS tablet Take 1 tablet by mouth daily.   Yes Historical Provider, MD  cephALEXin (KEFLEX) 500 MG capsule Take 1 capsule (500 mg total) by mouth 4 (four) times daily. 09/17/15   Marily MemosJason Rinaldo Macqueen, MD  glucose blood (BAYER  CONTOUR TEST) test strip TEST BLOOD SUGAR UP TO TWICE DAILY 09/30/14   Deatra CanterWilliam J Oxford, FNP  HYDROcodone-acetaminophen [facility](NORCO) 10-325 MG tablet Take 1 tablet by mouth every 6 (six) hours as needed for moderate pain. 09/17/15   Marily MemosJason Ciarah Peace, MD  Insulin Pen Needle (PEN NEEDLES) 32G X 4 MM MISC 1 each by Does not apply route daily. Use with Lantus Solostar to inject insulin once daily 05/06/15   Tammy Eckard, PHARMD  sulfamethoxazole-trimethoprim (BACTRIM DS,SEPTRA DS) 800-160 MG tablet Take 1 tablet by mouth 2 (two) times daily. 09/17/15 09/24/15  Barbara CowerJason Len Azeez, MD   BP 145/91 mmHg  Pulse 110  Temp(Src) 98.3 F (36.8 C) (Oral)  Resp 18  Wt 212 lb (96.163 kg)  SpO2 100% Physical Exam  Constitutional: He appears well-developed and well-nourished.  HENT:  Head: Normocephalic and atraumatic.  Neck: Normal range of motion.  Cardiovascular: Normal rate.   Pulmonary/Chest: Effort normal. No respiratory distress.  Abdominal: He exhibits no distension.  Musculoskeletal: Normal range of motion. He exhibits tenderness (none in bony portion of right foot).  Neurological: He is alert.  Skin: Rash (2/3 sole of leftfoot with some ttp assocaited with it and warmth, minimal induration, no fluctuancee ) noted.  Nursing note and vitals reviewed.   ED Course  Procedures (including critical care time) Labs Review Labs Reviewed  CBC WITH DIFFERENTIAL/PLATELET - Abnormal; Notable for the following:    WBC 11.5 (*)    Neutro Abs 8.7 (*)    All other components within normal limits  BASIC METABOLIC PANEL - Abnormal; Notable for the following:    Sodium 132 (*)    Chloride 97 (*)    Glucose, Bld 234 (*)    All other components within normal limits  CBG MONITORING, ED - Abnormal; Notable for the following:    Glucose-Capillary 198 (*)    All other components within normal limits    Imaging Review Dg Foot 2 Views Left  09/17/2015  CLINICAL DATA:  Redness and swelling along the plantar aspect of the  foot for 2-3 days. No known injury. Chronic foot deformity. EXAM: LEFT FOOT - 2 VIEW COMPARISON:  Right foot radiograph from 2015. FINDINGS: Chronic midfoot, hindfoot and ankle joint degenerative changes. Possible previous subtalar fusion. No acute bony findings or destructive bony changes. No gas is seen in the soft tissues. 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 No acute bony findings or destructive bony changes. Electronically Signed   By: Rudie MeyerP.  Gallerani M.D.   On: 09/17/2015 19:49   I have personally reviewed and evaluated these images and lab results as part of my medical decision-making.   EKG Interpretation None      MDM   Final diagnoses:  Cellulitis of left lower extremity   Likely cellulitis with slight tachycardia. Febrile at primary office, will give dose of vanc, fluids, pain meds, tylenol and reassess.  Likely will be able to dc on po abx if symptoms and VS improve.   On reassessment, pain or erythema hasn't worsened. Vitals improved. Continuing vanc infusion and can be d/c on keflex/bactrim to follow up with pcp on Monday, here if not improving in 2 days. Sister will help keep an eye on it and will bring back if needed.     Marily Memos, MD 09/17/15 9147  Marily Memos, MD 09/17/15 [phone]

## 2015-09-17 NOTE — Telephone Encounter (Signed)
Form on [place] office

## 2015-09-20 NOTE — Telephone Encounter (Signed)
[facility]

## 2015-09-21 NOTE — Progress Notes (Signed)
BP 127/92 mmHg  Pulse 93  Temp(Src) 101.1 F (38.4 C) (Oral)  Ht _0  (1.93 m)  Wt 212 lb 9.6 oz (96.435 kg)  BMI 25.89 kg/m2   Subjective:    Patient ID: [name], male    DOB: 04-May-1966, 49 y.o.   MRN: [id number]  HPI: [name] is a 49 y.o. male presenting on 09/17/2015 for Red spot on inside of left ankle, breakdown on bottom of lef and Fever   HPI Left foot infection Patient has redness and warmth on the sole of his left foot going up to the inside of his ankle. He also has developed a fever of 101. He has decreased sensation in his feet from his diabetes. He has had this spot on his foot and ankle for at least a week. He has also lost some toes on the other foot because of his diabetes and infections.  Relevant past medical, surgical, family and social history reviewed and updated as indicated. Interim medical history since our last visit reviewed. Allergies and medications reviewed and updated.  Review of Systems  Constitutional: Positive for fever.  HENT: Negative for ear discharge and ear pain.   Eyes: Negative for discharge and visual disturbance.  Respiratory: Negative for shortness of breath and wheezing.   Cardiovascular: Positive for leg swelling. Negative for chest pain.  Gastrointestinal: Negative for abdominal pain, diarrhea and constipation.  Genitourinary: Negative for difficulty urinating.  Musculoskeletal: Negative for back pain and gait problem.  Skin: Positive for color change and rash.  Neurological: Negative for syncope, light-headedness and headaches.  All other systems reviewed and are negative.   Per HPI unless specifically indicated above     Medication List       This list is accurate as of: 09/17/15  6:24 PM.  Always use your most recent med list.               amitriptyline 25 MG tablet  Commonly known as:  ELAVIL  Take 1 tablet (25 mg total) by mouth at bedtime.     aspirin EC 81 MG EC tablet  Generic drug:  aspirin   Take 81 mg by mouth daily. Swallow whole.     canagliflozin 300 MG Tabs tablet  Commonly known as:  INVOKANA  Take 300 mg by mouth daily before breakfast.     gabapentin 300 MG capsule  Commonly known as:  NEURONTIN  Take 600 mg by mouth daily. Take before sleep     glipiZIDE 5 MG tablet  Commonly known as:  GLUCOTROL  Take 5 mg by mouth daily before breakfast.     glucose blood test strip  Commonly known as:  BAYER CONTOUR TEST  TEST BLOOD SUGAR UP TO TWICE DAILY     HYDROcodone-acetaminophen 10-325 MG tablet  Commonly known as:  NORCO  Take 1 tablet by mouth every 6 (six) hours as needed.     ibuprofen 800 MG tablet  Commonly known as:  ADVIL,MOTRIN  Take 1 tablet (800 mg total) by mouth every 8 (eight) hours as needed for pain.     Insulin Glargine 100 UNIT/ML Solostar Pen  Commonly known as:  LANTUS SOLOSTAR  Inject 10-20 Units into the skin daily.     lisinopril 20 MG tablet  Commonly known as:  PRINIVIL,ZESTRIL  TAKE 1 TABLET (20 MG TOTAL) BY MOUTH DAILY.     metFORMIN 1000 MG tablet  Commonly known as:  GLUCOPHAGE  TAKE 1 TABLET TWICE A  DAY     multivitamin with minerals Tabs tablet  Take 1 tablet by mouth daily.     Pen Needles 32G X 4 MM Misc  1 each by Does not apply route daily. Use with Lantus Solostar to inject insulin once daily           Objective:    BP 127/92 mmHg  Pulse 93  Temp(Src) 101.1 F (38.4 C) (Oral)  Ht _0  (1.93 m)  Wt 212 lb 9.6 oz (96.435 kg)  BMI 25.89 kg/m2  Wt Readings from Last 3 Encounters:  09/17/15 212 lb (96.163 kg)  09/17/15 212 lb 9.6 oz (96.435 kg)  08/04/15 218 lb (98.884 kg)    Physical Exam  Constitutional: He is oriented to person, place, and time. He appears well-developed and well-nourished. No distress.  Eyes: Conjunctivae and EOM are normal. Pupils are equal, round, and reactive to light. Right eye exhibits no discharge. No scleral icterus.  Neck: Neck supple. No thyromegaly present.    Cardiovascular: Normal rate, regular rhythm, normal heart sounds and intact distal pulses.   No murmur heard. Pulmonary/Chest: Effort normal and breath sounds normal. No respiratory distress. He has no wheezes.  Musculoskeletal: Normal range of motion. He exhibits no edema.  Lymphadenopathy:    He has no cervical adenopathy.  Neurological: He is alert and oriented to person, place, and time. Coordination normal.  Skin: Skin is warm and dry. Rash (Petechial rash on inner left ankle) noted. He is not diaphoretic. There is erythema (Erythema and ulceration on the plantar surface of left foot that is 4 cm in diameter).  Psychiatric: He has a normal mood and affect. His behavior is normal.  Vitals reviewed.   Results for orders placed or performed in visit on 07/22/15  CMP14+EGFR  Result Value Ref Range   Glucose 285 (H) 65 - 99 mg/dL   BUN 11 6 - 24 mg/dL   Creatinine, Ser 0.90 0.76 - 1.27 mg/dL   GFR calc non Af Amer 99 >59 mL/min/1.73   GFR calc Af Amer 115 >59 mL/min/1.73   BUN/Creatinine Ratio 12 9 - 20   Sodium 137 134 - 144 mmol/L   Potassium 4.8 3.5 - 5.2 mmol/L   Chloride 97 97 - 108 mmol/L   CO2 32 (H) 18 - 29 mmol/L   Calcium 9.4 8.7 - 10.2 mg/dL   Total Protein 6.7 6.0 - 8.5 g/dL   Albumin 3.9 3.5 - 5.5 g/dL   Globulin, Total 2.8 1.5 - 4.5 g/dL   Albumin/Globulin Ratio 1.4 1.1 - 2.5   Bilirubin Total 0.7 0.0 - 1.2 mg/dL   Alkaline Phosphatase 166 (H) 39 - 117 IU/L   AST 18 0 - 40 IU/L   ALT 25 0 - 44 IU/L  Lipid panel  Result Value Ref Range   Cholesterol, Total 122 100 - 199 mg/dL   Triglycerides 142 0 - 149 mg/dL   HDL 29 (L) >39 mg/dL   VLDL Cholesterol Cal 28 5 - 40 mg/dL   LDL Calculated 65 0 - 99 mg/dL   Chol/HDL Ratio 4.2 0.0 - 5.0 ratio units  Microalbumin / creatinine urine ratio  Result Value Ref Range   Creatinine, Urine 200.3 Not Estab. mg/dL   Microalbum.,U,Random 180.5 Not Estab. ug/mL   MICROALB/CREAT RATIO 90.1 (H) 0.0 - 30.0 mg/g creat  POCT  glycosylated hemoglobin (Hb A1C)  Result Value Ref Range   Hemoglobin A1C 9.4       Assessment & Plan:  Problem List Items Addressed This Visit    None    Visit Diagnoses    Cellulitis of foot    -  Primary    fever and concern for possible osteomyelitis, sent to emergency department    Screen for colon cancer        Relevant Orders    Ambulatory referral to Gastroenterology       Follow up plan: Return in about 1 week (around 09/24/2015), or if symptoms worsen or fail to improve.  Counseling provided for all of the vaccine components Orders Placed This Encounter  Procedures  . Ambulatory referral to Gastroenterology    Caryl Pina, MD [facility] 09/21/2015, 11:20 AM

## 2015-09-22 ENCOUNTER — Ambulatory Visit: Payer: Self-pay | Admitting: Pharmacist

## 2015-10-14 ENCOUNTER — Other Ambulatory Visit: Payer: Self-pay | Admitting: Family Medicine

## 2015-10-15 ENCOUNTER — Ambulatory Visit (INDEPENDENT_AMBULATORY_CARE_PROVIDER_SITE_OTHER): Payer: 59 | Admitting: Pharmacist

## 2015-10-15 ENCOUNTER — Encounter: Payer: Self-pay | Admitting: Pharmacist

## 2015-10-15 VITALS — BP 132/87 | HR 76 | Ht 76.0 in | Wt 220.8 lb

## 2015-10-15 DIAGNOSIS — Z23 Encounter for immunization: Secondary | ICD-10-CM

## 2015-10-15 DIAGNOSIS — Z794 Long term (current) use of insulin: Secondary | ICD-10-CM

## 2015-10-15 DIAGNOSIS — E1140 Type 2 diabetes mellitus with diabetic neuropathy, unspecified: Secondary | ICD-10-CM

## 2015-10-15 LAB — POCT GLYCOSYLATED HEMOGLOBIN (HGB A1C): HEMOGLOBIN A1C: 11.4

## 2015-10-15 MED ORDER — INSULIN GLARGINE 100 UNIT/ML SOLOSTAR PEN: 25.0000 [IU] | PEN_INJECTOR | Freq: Every day | SUBCUTANEOUS | Status: DC

## 2015-10-15 MED ORDER — METFORMIN HCL 1000 MG PO TABS: 1000.0000 mg | ORAL_TABLET | Freq: Two times a day (BID) | ORAL | Status: DC

## 2015-10-15 NOTE — Patient Instructions (Signed)
Increase Lantus inject to 25 units daily - check blood glucose upon getting up.  If over 150 then increase by 1 units daily.  When you start to get readings less than 150 then hold at that Lantus dose.    Continue metformin 1060m twice a day with food, Invokana 3093mdaily and glipizide 9m19make 1 tablet daily  Diabetes and Standards of Medical Care   Diabetes is complicated. You may find that your diabetes team includes a dietitian, nurse, diabetes educator, eye doctor, and more. To help everyone know what is going on and to help you get the care you deserve, the following schedule of care was developed to help keep you on track. Below are the tests, exams, vaccines, medicines, education, and plans you will need.  Blood Glucose Goals Prior to meals = 80 - 130 Within 2 hours of the start of a meal = less than 180  HbA1c test (goal is less than 7.0% - your last value was 11.4%) This test shows how well you have controlled your glucose over the past 2 to 3 months. It is used to see if your diabetes management plan needs to be adjusted.   It is performed at least 2 times a year if you are meeting treatment goals.  It is performed 4 times a year if therapy has changed or if you are not meeting treatment goals.  Blood pressure test  This test is performed at every routine medical visit. The goal is less than 140/90 mmHg for most people, but 130/80 mmHg in some cases. Ask your health care provider about your goal.  Dental exam  Follow up with the dentist regularly.  Eye exam  If you are diagnosed with type 1 diabetes as a child, get an exam upon reaching the age of 10 35ars or older and have had diabetes for 3 to 5 years. Yearly eye exams are recommended after that initial eye exam.  If you are diagnosed with type 1 diabetes as an adult, get an exam within 5 years of diagnosis and then yearly.  If you are diagnosed with type 2 diabetes, get an exam as soon as possible after the diagnosis  and then yearly.  Foot care exam  Visual foot exams are performed at every routine medical visit. The exams check for cuts, injuries, or other problems with the feet.  A comprehensive foot exam should be done yearly. This includes visual inspection as well as assessing foot pulses and testing for loss of sensation.  Check your feet nightly for cuts, injuries, or other problems with your feet. Tell your health care provider if anything is not healing.  Kidney function test (urine microalbumin)  This test is performed once a year.  Type 1 diabetes: The first test is performed 5 years after diagnosis.  Type 2 diabetes: The first test is performed at the time of diagnosis.  A serum creatinine and estimated glomerular filtration rate (eGFR) test is done once a year to assess the level of chronic kidney disease (CKD), if present.  Lipid profile (cholesterol, HDL, LDL, triglycerides)  Performed every 5 years for most people.  The goal for LDL is less than 100 mg/dL. If you are at high risk, the goal is less than 70 mg/dL.  The goal for HDL is 40 mg/dL to 50 mg/dL for men and 50 mg/dL to 60 mg/dL for women. An HDL cholesterol of 60 mg/dL or higher gives some protection against heart disease.  The goal for  triglycerides is less than 150 mg/dL.  Influenza vaccine, pneumococcal vaccine, and hepatitis B vaccine  The influenza vaccine is recommended yearly.  The pneumococcal vaccine is generally given once in a lifetime. However, there are some instances when another vaccination is recommended. Check with your health care provider.  The hepatitis B vaccine is also recommended for adults with diabetes.  Diabetes self-management education  Education is recommended at diagnosis and ongoing as needed.  Treatment plan  Your treatment plan is reviewed at every medical visit.  Document Released: 08/20/2009 Document Revised: 06/25/2013 Document Reviewed: 03/25/2013 [facility] Patient  Information 2014 [place].

## 2015-10-15 NOTE — Progress Notes (Addendum)
Subjective:    [name] is a 49 y.o. male who presents for reevaluation of Type 2 diabetes mellitus.  I last saw [name] about 3 months ago.   He has been taking Lantus for about 10 weeks now and has not had any complications.   Current medications for diabetes are Lantus 12 units daily, Metformin  bid, glipizide  1 tablet qd and Invokana  1 tablet qd.    He states that his fatigue has increased over the last month.  He states this is due to trying to care for his father and mother.  His mother recently broke her hip and is at [facility] for rehab.  He also mentions that he is dating someone new who is a Engineer, civil (consulting) and she has been really encouraging him to get better control of his DM  He was initially diagnosed with DM about 3 years ago and until the last year his BG was well controlled.  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 He was in hospital 09/17/2015 due to cellulitis.  Patient reports that he is checking feet daily.  Current symptoms/problems include foot ulcerations, hyperglycemia, polydipsia and fatigue   Known diabetic complications: neuropathy, toe amputations.  Cardiovascular risk factors: advanced age (older than 37 for men, 13 for women), diabetes mellitus, dyslipidemia, hypertension, male gender and sedentary lifestyle  Eye exam current (within one year): yes  Weight trend: increased 8# over last 3 montsh  Current diet: in general, a "unhealthy" diet  , patient no longer drinks beverages with sugar,  A little more candy with the holidays.  More eating out - hamburgers and fries. Eating 3 sandwiches. Current exercise: none but he is doing more yard work for extra work.   Current monitoring regimen: home blood tests - 1 to 2 times daily Home blood sugar records: fasting range: 125 - 190 (per patient report).  Denies any BG readings over 200. Any episodes of  hypoglycemia? no  Is He on ACE inhibitor or angiotensin II receptor blocker?  Yes  lisinopril (Prinivil)    He has not been taking gabapentin regularly as he feels that neuropathy is better.  He works on concrete floors and when he needs something for pain he is taking hydrocodone/APAP 1/2 to 1 tablet as needed for pain.   The following portions of the patient's history were reviewed and updated as appropriate: allergies, current medications, past family history, past medical history, past social history, past surgical history and problem list.   Objective:    BP 132/87 mmHg  Pulse 76  Ht  (1.93 m)  Wt 220 lb 12 oz (100.132 kg)  BMI 26.88 kg/m2  A1c = 11.4% (today) increased from 9.4% 07/22/2015  Lab Review GLUCOSE (mg/dL)  Date Value  16/08/9603 285*  04/21/2015 276*  03/09/2014 334*   GLUCOSE, BLD (mg/dL)  Date Value  54/07/8118 234*  03/31/2015 301*  09/01/2014 106*   CO2 (mmol/L)  Date Value  09/17/2015 26  07/22/2015 32*  04/21/2015 26   BUN (mg/dL)  Date Value  14/78/2956 19  07/22/2015 11  04/21/2015 16  03/31/2015 16  09/01/2014 17  03/09/2014 14   CREATININE, SER (mg/dL)  Date Value  21/30/8657 0.92  07/22/2015 0.90  04/21/2015 0.85    Assessment:    Diabetes Mellitus type II, under inadequate control.  I am puzzled that patient reported BG readings  are much lower than what his A1c reflects.   Neuropathy - stable HTN - controlled Hyperlipidemia - LDL and Tg was at goal at last check - HDL was low Overweight - weight has increased over last 3 months by 8#   Plan:    1.  Rx changes:   Increase Lantus inject to 25 units daily - check blood glucose upon getting up.  If over 150 then increase by 1 unit daily   Continue metformin 1000mg  bid, Invokana 300mg  daily and glipizide 5mg  qam  Recommended that patient continue gabapentin prn neuropathic pain.  He is given appt to follow up with PCP regarding chronic pain and treatment.  Continue  lisinopril 20mg  daily.   2.  Education: Reviewed 'ABCs' of diabetes management (respective goals in parentheses):  A1C (<7), blood pressure (<130/80), and cholesterol (LDL <100).  Reviewed complications associated with DM especially poor wound healing and neuropathy. 3.  Compliance at present is estimated to be poor:    Discussed limiting bread (1 sandwich instead of 3) and other high CHO foods.  Discussed ways to increase non starchy vegetables.  Continue to increase physical activity - try to add in exercise and not just yardwork. Goal is 150 minutes weekly. 4. Follow up: 1 week with PCP;  4 weeks with me - he is reminded to bring in his blood glucose monitor to review at next visit.Henrene Pastor.   Caylei Sperry, PharmD, CPP, CDE  Addendum:  influenza vaccine given in office today

## 2015-10-16 LAB — CMP14+EGFR
A/G RATIO: 1.6 (ref 1.1–2.5)
ALK PHOS: 160 IU/L — AB (ref 39–117)
ALT: 26 IU/L (ref 0–44)
AST: 12 IU/L (ref 0–40)
Albumin: 4.2 g/dL (ref 3.5–5.5)
BILIRUBIN TOTAL: 0.6 mg/dL (ref 0.0–1.2)
BUN / CREAT RATIO: 16 (ref 9–20)
BUN: 16 mg/dL (ref 6–24)
CALCIUM: 9.3 mg/dL (ref 8.7–10.2)
CHLORIDE: 95 mmol/L — AB (ref 97–106)
CO2: 27 mmol/L (ref 18–29)
Creatinine, Ser: 0.98 mg/dL (ref 0.76–1.27)
GFR calc non Af Amer: 89 mL/min/{1.73_m2} (ref >59)
GFR, EST AFRICAN AMERICAN: 103 mL/min/{1.73_m2} (ref >59)
GLOBULIN, TOTAL: 2.6 g/dL (ref 1.5–4.5)
Glucose: 397 mg/dL — ABNORMAL HIGH (ref 65–99)
Potassium: 5.1 mmol/L (ref 3.5–5.2)
SODIUM: 134 mmol/L — AB (ref 136–144)
Total Protein: 6.8 g/dL (ref 6.0–8.5)

## 2015-10-20 ENCOUNTER — Ambulatory Visit: Payer: Self-pay | Admitting: Family Medicine

## 2015-10-28 ENCOUNTER — Ambulatory Visit (INDEPENDENT_AMBULATORY_CARE_PROVIDER_SITE_OTHER): Payer: 59 | Admitting: Family Medicine

## 2015-10-28 ENCOUNTER — Encounter: Payer: Self-pay | Admitting: Family Medicine

## 2015-10-28 VITALS — BP 143/90 | HR 93 | Temp 97.6°F | Ht 76.0 in | Wt 226.0 lb

## 2015-10-28 DIAGNOSIS — S91301S Unspecified open wound, right foot, sequela: Secondary | ICD-10-CM

## 2015-10-28 MED ORDER — HYDROCODONE-ACETAMINOPHEN 10-325 MG PO TABS: 1.0000 | ORAL_TABLET | Freq: Four times a day (QID) | ORAL | Status: DC | PRN

## 2015-10-28 NOTE — Progress Notes (Signed)
   Subjective:    Patient ID: [name], male    DOB: January 29, 1966, 49 y.o.   MRN: [id number]  HPI 49 year old gentleman with diabetes that is not well controlled. Admittedly he eats much of the wrong food. Last A1c was 11.4. 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 He takes Neurontin as needed as well as hydrocodone 10 mg as needed.    Review of Systems  Constitutional: Negative.   Respiratory: Negative.   Cardiovascular: Negative.   Gastrointestinal: Negative.   Musculoskeletal: Positive for arthralgias.  Allergic/Immunologic: Positive for environmental allergies.  Psychiatric/Behavioral: Negative.       BP 143/90 mmHg  Pulse 93  Temp(Src) 97.6 F (36.4 C) (Oral)  Ht 6\' 4"  (1.93 m)  Wt 226 lb (102.513 kg)  BMI 27.52 kg/m2  Objective:   Physical Exam  Constitutional: He appears well-developed and well-nourished.  Cardiovascular: Normal rate and regular rhythm.   Pulmonary/Chest: Effort normal and breath sounds normal.  Musculoskeletal:  Exam of left knee reveals some crepitance with flexion and extension          Assessment & Plan:  1. Wound, open, foot with complication, right, sequela The wound has healed but he still has significant pain worse with walking and working but he has not considered not working. Will refill hydrocodone. He takes this as needed. I do not think there is any abuse potential here - HYDROcodone-acetaminophen (NORCO) 10-325 MG tablet; Take 1 tablet by mouth every 6 (six) hours as needed for moderate pain.  Dispense: 120 tablet; Refill: 0  Frederica KusterStephen M Caretha Rumbaugh MD

## 2015-11-17 ENCOUNTER — Ambulatory Visit: Payer: Self-pay | Admitting: Pharmacist

## 2015-12-07 ENCOUNTER — Encounter: Payer: Self-pay | Admitting: Family Medicine

## 2015-12-07 ENCOUNTER — Encounter (INDEPENDENT_AMBULATORY_CARE_PROVIDER_SITE_OTHER): Payer: Self-pay

## 2015-12-07 ENCOUNTER — Ambulatory Visit (INDEPENDENT_AMBULATORY_CARE_PROVIDER_SITE_OTHER): Payer: 59 | Admitting: Family Medicine

## 2015-12-07 ENCOUNTER — Ambulatory Visit (HOSPITAL_COMMUNITY)
Admission: RE | Admit: 2015-12-07 | Discharge: 2015-12-07 | Disposition: A | Discharge status: Home / Self Care | Payer: 59 | Source: Ambulatory Visit | Attending: Family Medicine | Admitting: Family Medicine

## 2015-12-07 VITALS — BP 126/92 | HR 101 | Temp 97.2°F | Ht 76.0 in | Wt 221.0 lb

## 2015-12-07 DIAGNOSIS — E119 Type 2 diabetes mellitus without complications: Secondary | ICD-10-CM | POA: Diagnosis not present

## 2015-12-07 DIAGNOSIS — R29810 Facial weakness: Secondary | ICD-10-CM | POA: Diagnosis not present

## 2015-12-07 DIAGNOSIS — R519 Headache, unspecified: Secondary | ICD-10-CM

## 2015-12-07 DIAGNOSIS — R51 Headache: Secondary | ICD-10-CM

## 2015-12-07 DIAGNOSIS — I10 Essential (primary) hypertension: Secondary | ICD-10-CM | POA: Insufficient documentation

## 2015-12-07 DIAGNOSIS — R42 Dizziness and giddiness: Secondary | ICD-10-CM

## 2015-12-07 DIAGNOSIS — R202 Paresthesia of skin: Secondary | ICD-10-CM

## 2015-12-07 LAB — GLUCOSE, POCT (MANUAL RESULT ENTRY): POC GLUCOSE: 214 mg/dL — AB (ref 70–99)

## 2015-12-07 NOTE — Patient Instructions (Signed)
Great to meet you!  I am sending you for an MRI to be sure that you have not had any stroke.   It sounds like you could possibly have a viral infection causing several symptoms but nothing can explain all of your symptoms easily.   Get plenty of fluids, continue Lantus.   Lets see you back Thursday.

## 2015-12-07 NOTE — Progress Notes (Signed)
   HPI  Patient presents today here with acute complaints  He reports 3 days of BL Foot numbness, dizziness with mild room spinning sensation, and intermittent HA.   He also states that he had R sided facial droop last week which has resolved.   He took a partial course of antibiotics last week for nasal lesion while his fasce was drooping.   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  He is compliant with lantus, tolerating fluids normally.  His last CBG was 150.   He has no increased dizziness with head turning.    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, status post great toe education on the right Neuro: Alert and oriented, Dix-Hallpike maneuver, strength 5/5 and sensation intact in all 4 extremities, cranial nerves II through XII intact, sensation intact to monofilament throughout bilateral feet  Diabetic foot exam 1+ dorsalis pedis pulse bilaterally, status post great toe amputation on the right Inversion of left foot consistent with Charcot ankle Sensation intact to monofilament throughout, no paresthesia  He shows me a picture of himself from last week with a right-sided facial droop, no facial droop on exam today  Assessment and plan:  # Dizziness, headache, foot numbness Considering poorly controlled diabetes, obvious severe diabetic damage leading to amputation and Charcot ankle, and unexplained dizziness, headache, right-sided facial droop which has resolved, and foot numbness I am going to treat him aggressively and evaluate for stroke today Stat MRI The bilateral foot  numbness does not make sense for stroke, however given his severe diabetes I think this is most prudent. Note that his brother has had an early ad a stroke which was debilitating and is now residing in a nursing home. (brother was in his 51s at the time)   Follow up on Thursday (2 days), covered out of work for 2 missed nights + 2 more days.    Orders Placed This Encounter  Procedures  . MR Brain Wo Contrast    Standing Status: Future     Number of Occurrences:      Standing Expiration Date: 02/03/2017    Order Specific Question:  Reason for Exam (SYMPTOM  OR DIAGNOSIS REQUIRED)    Answer:  eval for ischemia    Order Specific Question:  Preferred imaging location?    Answer:  [facility]    Order Specific Question:  Does the patient have a pacemaker or implanted devices?    Answer:  No    Order Specific Question:  What is the patient's sedation requirement?    Answer:  No Sedation  . CMP14+EGFR  . CBC with Differential/Platelet  . POCT glucose (manual entry)    Laroy Apple, MD [facility] 12/07/2015, 9:42 AM

## 2015-12-08 LAB — CMP14+EGFR
ALK PHOS: 152 IU/L — AB (ref 39–117)
ALT: 31 IU/L (ref 0–44)
AST: 18 IU/L (ref 0–40)
Albumin/Globulin Ratio: 1.6 (ref 1.1–2.5)
Albumin: 4.3 g/dL (ref 3.5–5.5)
BILIRUBIN TOTAL: 0.7 mg/dL (ref 0.0–1.2)
BUN / CREAT RATIO: 16 (ref 9–20)
BUN: 15 mg/dL (ref 6–24)
CHLORIDE: 95 mmol/L — AB (ref 96–106)
CO2: 27 mmol/L (ref 18–29)
Calcium: 9.6 mg/dL (ref 8.7–10.2)
Creatinine, Ser: 0.93 mg/dL (ref 0.76–1.27)
GFR calc Af Amer: 109 mL/min/{1.73_m2} (ref >59)
GFR calc non Af Amer: 95 mL/min/{1.73_m2} (ref >59)
GLUCOSE: 209 mg/dL — AB (ref 65–99)
Globulin, Total: 2.7 g/dL (ref 1.5–4.5)
Potassium: 4.3 mmol/L (ref 3.5–5.2)
Sodium: 139 mmol/L (ref 134–144)
Total Protein: 7 g/dL (ref 6.0–8.5)

## 2015-12-08 LAB — CBC WITH DIFFERENTIAL/PLATELET
BASOS ABS: 0 10*3/uL (ref 0.0–0.2)
Basos: 0 %
EOS (ABSOLUTE): 0 10*3/uL (ref 0.0–0.4)
Eos: 0 %
HEMOGLOBIN: 19 g/dL — AB (ref 12.6–17.7)
Hematocrit: 53.5 % — ABNORMAL HIGH (ref 37.5–51.0)
Immature Grans (Abs): 0.1 10*3/uL (ref 0.0–0.1)
Immature Granulocytes: 1 %
LYMPHS ABS: 1.7 10*3/uL (ref 0.7–3.1)
LYMPHS: 24 %
MCH: 31.6 pg (ref 26.6–33.0)
MCHC: 35.5 g/dL (ref 31.5–35.7)
MCV: 89 fL (ref 79–97)
MONOCYTES: 6 %
Monocytes Absolute: 0.4 10*3/uL (ref 0.1–0.9)
NEUTROS PCT: 69 %
Neutrophils Absolute: 4.8 10*3/uL (ref 1.4–7.0)
Platelets: 183 10*3/uL (ref 150–379)
RBC: 6.01 x10E6/uL — AB (ref 4.14–5.80)
RDW: 13.7 % (ref 12.3–15.4)
WBC: 7.1 10*3/uL (ref 3.4–10.8)

## 2015-12-09 ENCOUNTER — Telehealth: Payer: Self-pay | Admitting: Family Medicine

## 2015-12-09 ENCOUNTER — Encounter: Payer: Self-pay | Admitting: Family Medicine

## 2015-12-09 ENCOUNTER — Ambulatory Visit (INDEPENDENT_AMBULATORY_CARE_PROVIDER_SITE_OTHER): Payer: 59 | Admitting: Family Medicine

## 2015-12-09 VITALS — BP 122/80 | HR 85 | Temp 97.5°F | Ht 76.0 in | Wt 221.8 lb

## 2015-12-09 DIAGNOSIS — R42 Dizziness and giddiness: Secondary | ICD-10-CM | POA: Diagnosis not present

## 2015-12-09 DIAGNOSIS — D751 Secondary polycythemia: Secondary | ICD-10-CM

## 2015-12-09 MED ORDER — PREGABALIN 75 MG PO CAPS: 75.0000 mg | ORAL_CAPSULE | Freq: Two times a day (BID) | ORAL | Status: DC

## 2015-12-09 NOTE — Progress Notes (Signed)
   HPI  Patient presents today seen for follow-up of dizziness.  Patient explains that his dizziness has improved but continued. He feels these improved with rest, he got out side and walked around a little bit yesterday and had a return of dizziness. It is improved clearly been is persisting.  On further discussion I found that he has had this dizziness intermittently for several months, usually comes on in short spells and then goes away. It does not get worse with head turning, however it is a room spinning type sensation.  He also has a cap- Like distribution headache for about 3-4 days, no nausea or vomiting and no photophobia.  Considering his dizziness, right facial droop last week, and recent worsening of foot numbness I sent him for an MRI on Monday which was negative for acute stroke.  PMH: Smoking status noted ROS: Per HPI  Objective: BP 122/80 mmHg  Pulse 85  Temp(Src) 97.5 F (36.4 C) (Oral)  Ht  (1.93 m)  Wt 221 lb 12.8 oz (100.608 kg)  BMI 27.01 kg/m2 Gen: NAD, alert, cooperative with exam HEENT: NCAT, PERRLA CV: RRR, good S1/S2, no murmur Resp: CTABL, no wheezes, non-labored Ext: No edema, warm Neuro: Alert and oriented, No gross deficits  Assessment and plan:  # Dizziness, recent right-sided facial droop which has resolved. MRI brain was negative for stroke. Considering his severe diabetes and family history on concerned that this may have been a TIA. Refer to neurology for recommendations and evaluation For now provided reassurance, it is improving. It sounds vertiginous in room spinning sensation, however he has no worsening with head turning in his Dix-Hallpike was negative last visit.  # Diabetic neuropathy Change gabapentin to Lyrica. Discontinue Elavil, he has not been using this  # Headache Tension type headache  I have placed him out of work for another few days, he will return to see me on Monday and plan to return to work on  Tuesday. Considering his dizziness have asked him not to drive, he drives a forklift at work so I believe that this may be dangerous for him to perform for this short period of time.  Orders Placed This Encounter  Procedures  . CBC with Differential    Meds ordered this encounter  Medications  . pregabalin (LYRICA) 75 MG capsule    Sig: Take 1 capsule (75 mg total) by mouth 2 (two) times daily.    Dispense:  60 capsule    Refill:  2    Murtis Sink, MD [facility] 12/09/2015, 11:06 AM

## 2015-12-09 NOTE — Patient Instructions (Signed)
Great to see you!  Lets see you back on Monday and plan to return back to work Tuesday.   Start lyrica tonight, stop gabapentin  Dizziness Dizziness is a common problem. It is a feeling of unsteadiness or light-headedness. You may feel like you are about to faint. Dizziness can lead to injury if you stumble or fall. Anyone can become dizzy, but dizziness is more common in older adults. This condition can be caused by a number of things, including medicines, dehydration, or illness. HOME CARE INSTRUCTIONS Taking these steps may help with your condition: Eating and Drinking  Drink enough fluid to keep your urine clear or pale yellow. This helps to keep you from becoming dehydrated. Try to drink more clear fluids, such as water.  Do not drink alcohol.  Limit your caffeine intake if directed by your health care provider.  Limit your salt intake if directed by your health care provider. Activity  Avoid making quick movements.  Rise slowly from chairs and steady yourself until you feel okay.  In the morning, first sit up on the side of the bed. When you feel okay, stand slowly while you hold onto something until you know that your balance is fine.  Move your legs often if you need to stand in one place for a long time. Tighten and relax your muscles in your legs while you are standing.  Do not drive or operate heavy machinery if you feel dizzy.  Avoid bending down if you feel dizzy. Place items in your home so that they are easy for you to reach without leaning over. Lifestyle  Do not use any tobacco products, including cigarettes, chewing tobacco, or electronic cigarettes. If you need help quitting, ask your health care provider.  Try to reduce your stress level, such as with yoga or meditation. Talk with your health care provider if you need help. General Instructions  Watch your dizziness for any changes.  Take medicines only as directed by your health care provider. Talk with  your health care provider if you think that your dizziness is caused by a medicine that you are taking.  Tell a friend or a family member that you are feeling dizzy. If he or she notices any changes in your behavior, have this person call your health care provider.  Keep all follow-up visits as directed by your health care provider. This is important. SEEK MEDICAL CARE IF:  Your dizziness does not go away.  Your dizziness or light-headedness gets worse.  You feel nauseous.  You have reduced hearing.  You have new symptoms.  You are unsteady on your feet or you feel like the room is spinning. SEEK IMMEDIATE MEDICAL CARE IF:  You vomit or have diarrhea and are unable to eat or drink anything.  You have problems talking, walking, swallowing, or using your arms, hands, or legs.  You feel generally weak.  You are not thinking clearly or you have trouble forming sentences. It may take a friend or family member to notice this.  You have chest pain, abdominal pain, shortness of breath, or sweating.  Your vision changes.  You notice any bleeding.  You have a headache.  You have neck pain or a stiff neck.  You have a fever.   This information is not intended to replace advice given to you by your health care provider. Make sure you discuss any questions you have with your health care provider.   Document Released: 04/18/2001 Document Revised: 03/09/2015 Document Reviewed:  10/19/2014 Elsevier Interactive Patient Education [employer]

## 2015-12-10 LAB — CBC WITH DIFFERENTIAL/PLATELET
BASOS: 1 %
Basophils Absolute: 0 10*3/uL (ref 0.0–0.2)
EOS (ABSOLUTE): 0 10*3/uL (ref 0.0–0.4)
Eos: 0 %
HEMOGLOBIN: 18.7 g/dL — AB (ref 12.6–17.7)
Hematocrit: 54.4 % — ABNORMAL HIGH (ref 37.5–51.0)
IMMATURE GRANS (ABS): 0 10*3/uL (ref 0.0–0.1)
IMMATURE GRANULOCYTES: 1 %
LYMPHS: 24 %
Lymphocytes Absolute: 1.6 10*3/uL (ref 0.7–3.1)
MCH: 30.8 pg (ref 26.6–33.0)
MCHC: 34.4 g/dL (ref 31.5–35.7)
MCV: 90 fL (ref 79–97)
MONOCYTES: 6 %
Monocytes Absolute: 0.4 10*3/uL (ref 0.1–0.9)
NEUTROS ABS: 4.4 10*3/uL (ref 1.4–7.0)
NEUTROS PCT: 68 %
Platelets: 191 10*3/uL (ref 150–379)
RBC: 6.07 x10E6/uL — ABNORMAL HIGH (ref 4.14–5.80)
RDW: 13.9 % (ref 12.3–15.4)
WBC: 6.5 10*3/uL (ref 3.4–10.8)

## 2015-12-10 NOTE — Telephone Encounter (Signed)
He has failed gabapentin, will ask to attempt prior auth.   Polycythemia that is unexplained, referring to Heme/Onc  Murtis Sink, MD [facility] 12/10/2015, 9:48 AM

## 2015-12-13 ENCOUNTER — Ambulatory Visit (INDEPENDENT_AMBULATORY_CARE_PROVIDER_SITE_OTHER): Payer: 59 | Admitting: Family Medicine

## 2015-12-13 ENCOUNTER — Encounter: Payer: Self-pay | Admitting: Family Medicine

## 2015-12-13 ENCOUNTER — Telehealth: Payer: Self-pay | Admitting: Family Medicine

## 2015-12-13 VITALS — BP 126/80 | HR 88 | Temp 97.7°F | Ht 76.0 in | Wt 223.8 lb

## 2015-12-13 DIAGNOSIS — E1142 Type 2 diabetes mellitus with diabetic polyneuropathy: Secondary | ICD-10-CM

## 2015-12-13 DIAGNOSIS — R42 Dizziness and giddiness: Secondary | ICD-10-CM

## 2015-12-13 DIAGNOSIS — Z0289 Encounter for other administrative examinations: Secondary | ICD-10-CM

## 2015-12-13 NOTE — Patient Instructions (Signed)
Great to see you!   

## 2015-12-13 NOTE — Progress Notes (Signed)
   HPI  Patient presents today  Here for follow-up of numbness and dizziness.   patient explains that he feels better since resting for a week.  his numbness and tingling f his bilateral feet has eased up some. Note most notably his dizziness has improved  he is able to drive to the clinic without problem this morning.   Lyrica was denied by insurance ,we will try to prior authorized the medicatin as he ha tried gabapentin which causes too muh sedation totake during daylight hours.  PMH: Smoking status noted ROS: Per HPI  Objective: BP 126/80 mmHg  Pulse 88  Temp(Src) 97.7 F (36.5 C) (Oral)  Ht  (1.93 m)  Wt 223 lb 12.8 oz (101.515 kg)  BMI 27.25 kg/m2 Gen: NAD, alert, cooperative with exam HEENT: NCAT CV: RRR, good S1/S2, no murmur Resp: CTABL, no wheezes, non-labored Ext: No edema, warm Neuro: Alert and oriented, No gross deficits  Assessment and plan:  # Dizziness resolved  # Diabetic neuropathy Trying to change to lyrica for day-time benefit without sedation Consider TCA Return to work tomorrow.     Murtis Sink, MD [facility] 12/13/2015, 10:00 AM

## 2015-12-14 ENCOUNTER — Telehealth: Payer: Self-pay

## 2015-12-14 MED ORDER — DULOXETINE HCL 30 MG PO CPEP: 30.0000 mg | ORAL_CAPSULE | Freq: Every day | ORAL | Status: DC

## 2015-12-14 MED ORDER — GABAPENTIN 600 MG PO TABS: 600.0000 mg | ORAL_TABLET | Freq: Every day | ORAL | Status: DC

## 2015-12-14 NOTE — Telephone Encounter (Signed)
Insurance denied prior authorization for Lyrica  Must have dx of Seizure disorder or Neuropathic pain associated with spinal cord injury or tried Cymbalta or Amitriptyline

## 2015-12-14 NOTE — Telephone Encounter (Signed)
I discussed with patient when he was here working on [employer] paperwork in the clinic.   Trial of cymbalta, RTC 1 month for re-check.   Continue gabapentin at night  Murtis Sink, MD [facility] 12/14/2015, 2:21 PM

## 2015-12-23 ENCOUNTER — Ambulatory Visit (HOSPITAL_COMMUNITY): Payer: 59 | Admitting: Oncology

## 2016-01-06 ENCOUNTER — Ambulatory Visit (HOSPITAL_COMMUNITY): Payer: 59 | Admitting: Oncology

## 2016-01-10 ENCOUNTER — Ambulatory Visit: Payer: 59 | Admitting: Family Medicine

## 2016-01-11 ENCOUNTER — Other Ambulatory Visit: Payer: Self-pay | Admitting: Family Medicine

## 2016-01-26 ENCOUNTER — Other Ambulatory Visit: Payer: Self-pay | Admitting: Family Medicine

## 2016-01-26 DIAGNOSIS — S91301S Unspecified open wound, right foot, sequela: Secondary | ICD-10-CM

## 2016-01-26 MED ORDER — HYDROCODONE-ACETAMINOPHEN 10-325 MG PO TABS: 1.0000 | ORAL_TABLET | Freq: Four times a day (QID) | ORAL | Status: DC | PRN

## 2016-01-26 NOTE — Telephone Encounter (Signed)
Home number busy x 2, cell phone no answer and VM full

## 2016-01-26 NOTE — Telephone Encounter (Signed)
Last seen and filled 10/28/15

## 2016-01-27 NOTE — Telephone Encounter (Signed)
Patient came by office for script.

## 2016-01-28 ENCOUNTER — Ambulatory Visit (HOSPITAL_COMMUNITY): Payer: 59 | Admitting: Oncology

## 2016-02-08 ENCOUNTER — Telehealth: Payer: Self-pay | Admitting: Family Medicine

## 2016-02-09 MED ORDER — BASAGLAR KWIKPEN 100 UNIT/ML ~~LOC~~ SOPN: 25.0000 [IU] | PEN_INJECTOR | Freq: Every day | SUBCUTANEOUS | Status: DC

## 2016-02-09 NOTE — Telephone Encounter (Signed)
Rx sent in for Basaglar. Patient is also due appt.   With PCP.  Appt made with Dr Hyacinth MeekerMiller for 03/03/16 to recheck diabetes. Patient notified.

## 2016-03-03 ENCOUNTER — Ambulatory Visit (INDEPENDENT_AMBULATORY_CARE_PROVIDER_SITE_OTHER): Payer: 59 | Admitting: Family Medicine

## 2016-03-03 ENCOUNTER — Encounter: Payer: Self-pay | Admitting: Family Medicine

## 2016-03-03 ENCOUNTER — Encounter (INDEPENDENT_AMBULATORY_CARE_PROVIDER_SITE_OTHER): Payer: Self-pay

## 2016-03-03 VITALS — BP 120/83 | HR 88 | Temp 97.5°F | Ht 76.0 in | Wt 221.6 lb

## 2016-03-03 DIAGNOSIS — Z794 Long term (current) use of insulin: Secondary | ICD-10-CM | POA: Diagnosis not present

## 2016-03-03 DIAGNOSIS — I10 Essential (primary) hypertension: Secondary | ICD-10-CM

## 2016-03-03 DIAGNOSIS — E1169 Type 2 diabetes mellitus with other specified complication: Secondary | ICD-10-CM | POA: Diagnosis not present

## 2016-03-03 DIAGNOSIS — E1140 Type 2 diabetes mellitus with diabetic neuropathy, unspecified: Secondary | ICD-10-CM | POA: Diagnosis not present

## 2016-03-03 DIAGNOSIS — E785 Hyperlipidemia, unspecified: Secondary | ICD-10-CM

## 2016-03-03 LAB — CMP14+EGFR
ALT: 28 IU/L (ref 0–44)
AST: 18 IU/L (ref 0–40)
Albumin/Globulin Ratio: 1.8 (ref 1.2–2.2)
Albumin: 4.3 g/dL (ref 3.5–5.5)
Alkaline Phosphatase: 143 IU/L — ABNORMAL HIGH (ref 39–117)
BILIRUBIN TOTAL: 1 mg/dL (ref 0.0–1.2)
BUN / CREAT RATIO: 15 (ref 9–20)
BUN: 14 mg/dL (ref 6–24)
CHLORIDE: 100 mmol/L (ref 96–106)
CO2: 27 mmol/L (ref 18–29)
Calcium: 9.3 mg/dL (ref 8.7–10.2)
Creatinine, Ser: 0.94 mg/dL (ref 0.76–1.27)
GFR, EST AFRICAN AMERICAN: 108 mL/min/{1.73_m2} (ref >59)
GFR, EST NON AFRICAN AMERICAN: 93 mL/min/{1.73_m2} (ref >59)
GLUCOSE: 218 mg/dL — AB (ref 65–99)
Globulin, Total: 2.4 g/dL (ref 1.5–4.5)
POTASSIUM: 4.8 mmol/L (ref 3.5–5.2)
SODIUM: 142 mmol/L (ref 134–144)
TOTAL PROTEIN: 6.7 g/dL (ref 6.0–8.5)

## 2016-03-03 LAB — LIPID PANEL
CHOL/HDL RATIO: 3.5 ratio (ref 0.0–5.0)
Cholesterol, Total: 113 mg/dL (ref 100–199)
HDL: 32 mg/dL — ABNORMAL LOW (ref >39)
LDL Calculated: 63 mg/dL (ref 0–99)
Triglycerides: 90 mg/dL (ref 0–149)
VLDL Cholesterol Cal: 18 mg/dL (ref 5–40)

## 2016-03-03 LAB — BAYER DCA HB A1C WAIVED: HB A1C: 11.1 % — AB (ref <7.0)

## 2016-03-03 NOTE — Progress Notes (Signed)
Subjective:    Patient ID: [name], male    DOB: 07-Jan-1966, 50 y.o.   MRN: [id number]  HPI 50 year old gentleman here to follow-up diabetes. He is on multiple medicines for sugar but continues to eat Snickers and biscuits and generally not doing very well with his diet. Last A1c was 11.4. Today is 11.1 which basically is unchanged. We spent some time today again talking about diet and he acknowledges he can do better but remains to be seen if that happens. He uses hydrocodone once or twice a week for leg pain if he has been walking a lot. But generally symptoms of neuropathy are not large.  Patient Active Problem List   Diagnosis Date Noted  . Polycythemia 12/09/2015  . Dizziness 12/09/2015  . Type 2 diabetes mellitus ([place]) 07/22/2015  . Splinter 06/18/2014  . Osteomyelitis of toe of right foot ([place]) 06/13/2014  . Osteomyelitis ([place]) 06/12/2014  . Neuropathy in diabetes ([place]) 03/31/2014  . Charcot ankle 03/09/2014  . Essential hypertension, benign 03/03/2013  . Diabetes ([place]) 03/03/2013  . DDD (degenerative disc disease), lumbar 03/03/2013   Outpatient Encounter Prescriptions as of 03/03/2016  Medication Sig  . aspirin (ASPIRIN EC) 81 MG EC tablet Take 81 mg by mouth daily. Swallow whole.  . DULoxetine (CYMBALTA) 30 MG capsule Take 1 capsule (30 mg total) by mouth daily.  Marland Kitchen gabapentin (NEURONTIN) 600 MG tablet Take 1 tablet (600 mg total) by mouth at bedtime.  Marland Kitchen glipiZIDE (GLUCOTROL) 5 MG tablet Take 5 mg by mouth daily before breakfast.  . glipiZIDE (GLUCOTROL) 5 MG tablet TAKE 1 TABLET (5 MG TOTAL) BY MOUTH 2 (TWO) TIMES DAILY BEFORE A MEAL.  Marland Kitchen glucose blood (BAYER CONTOUR TEST) test strip TEST BLOOD SUGAR UP TO TWICE DAILY  . HYDROcodone-acetaminophen (NORCO) 10-325 MG tablet Take 1 tablet by mouth every 6 (six) hours as needed for moderate pain.  . Insulin Glargine (BASAGLAR KWIKPEN) 100 UNIT/ML SOPN Inject 0.25-0.35 mLs (25-35 Units total) into the skin at bedtime.  .  Insulin Pen Needle (PEN NEEDLES) 32G X 4 MM MISC 1 each by Does not apply route daily. Use with Lantus Solostar to inject insulin once daily  . INVOKANA 300 MG TABS tablet TAKE 1 TABLET DAILY BEFORE BREAKFAST  . lisinopril (PRINIVIL,ZESTRIL) 20 MG tablet TAKE 1 TABLET (20 MG TOTAL) BY MOUTH DAILY.  . metFORMIN (GLUCOPHAGE) 1000 MG tablet Take 1 tablet (1,000 mg total) by mouth 2 (two) times daily.  . Multiple Vitamin (MULTIVITAMIN WITH MINERALS) TABS tablet Take 1 tablet by mouth daily.   No facility-administered encounter medications on file as of 03/03/2016.      Review of Systems  Constitutional: Negative.   HENT: Negative.   Respiratory: Negative.   Cardiovascular: Negative.   Neurological: Positive for numbness.  Psychiatric/Behavioral: Negative.        Objective:   Physical Exam  Constitutional: He is oriented to person, place, and time. He appears well-developed and well-nourished.  Cardiovascular: Normal rate, regular rhythm and normal heart sounds.   Pulmonary/Chest: Effort normal and breath sounds normal.  Abdominal: Soft.  Neurological: He is alert and oriented to person, place, and time.          Assessment & Plan:  1. Type 2 diabetes mellitus with diabetic neuropathy, with long-term current use of insulin (HCC) Just increased long-acting insulin to 30 units daily at bedtime and as noted above try to be more compliant with sweets and carbohydrate restriction - Bayer DCA Hb A1c Waived  2. Hyperlipidemia associated with type 2 diabetes mellitus ([place]) Surprisingly is not on any cholesterol medicine  - Lipid panel  3. Essential hypertension Blood pressures are controlled on current regimen continue lisinopril.  Wardell Honour MD - [phone]

## 2016-05-02 ENCOUNTER — Other Ambulatory Visit: Payer: Self-pay | Admitting: Family Medicine

## 2016-05-31 LAB — HM DIABETES EYE EXAM

## 2016-06-08 ENCOUNTER — Telehealth: Payer: Self-pay | Admitting: Family Medicine

## 2016-06-08 DIAGNOSIS — S91301S Unspecified open wound, right foot, sequela: Secondary | ICD-10-CM

## 2016-06-08 MED ORDER — HYDROCODONE-ACETAMINOPHEN 10-325 MG PO TABS: 1.0000 | ORAL_TABLET | Freq: Four times a day (QID) | ORAL | 0 refills | Status: DC | PRN

## 2016-06-08 NOTE — Telephone Encounter (Signed)
Last filled 01/26/16, last seen 03/03/16. Rx will print

## 2016-06-19 ENCOUNTER — Ambulatory Visit (INDEPENDENT_AMBULATORY_CARE_PROVIDER_SITE_OTHER): Payer: 59

## 2016-06-19 ENCOUNTER — Ambulatory Visit (INDEPENDENT_AMBULATORY_CARE_PROVIDER_SITE_OTHER): Payer: 59 | Admitting: Pediatrics

## 2016-06-19 ENCOUNTER — Encounter: Payer: Self-pay | Admitting: Pediatrics

## 2016-06-19 ENCOUNTER — Telehealth: Payer: Self-pay | Admitting: Family Medicine

## 2016-06-19 ENCOUNTER — Other Ambulatory Visit: Payer: Self-pay | Admitting: Pharmacist

## 2016-06-19 VITALS — BP 120/86 | HR 91 | Temp 97.8°F | Ht 76.0 in | Wt 218.6 lb

## 2016-06-19 DIAGNOSIS — M79671 Pain in right foot: Secondary | ICD-10-CM | POA: Diagnosis not present

## 2016-06-19 DIAGNOSIS — Z794 Long term (current) use of insulin: Secondary | ICD-10-CM

## 2016-06-19 DIAGNOSIS — L03031 Cellulitis of right toe: Secondary | ICD-10-CM

## 2016-06-19 DIAGNOSIS — E1140 Type 2 diabetes mellitus with diabetic neuropathy, unspecified: Secondary | ICD-10-CM

## 2016-06-19 DIAGNOSIS — Z1159 Encounter for screening for other viral diseases: Secondary | ICD-10-CM | POA: Diagnosis not present

## 2016-06-19 LAB — BAYER DCA HB A1C WAIVED: HB A1C: 7.4 % — AB (ref <7.0)

## 2016-06-19 MED ORDER — CEPHALEXIN 500 MG PO CAPS: 500.0000 mg | ORAL_CAPSULE | Freq: Four times a day (QID) | ORAL | 0 refills | Status: DC

## 2016-06-19 NOTE — Progress Notes (Signed)
    Subjective:    Patient ID: [name], male    DOB: 09-Dec-1965, 50 y.o.   MRN: [id number]  CC: Foot Pain and Foot Swelling   HPI: [name] is a 50 y.o. male presenting for Foot Pain and Foot Swelling     Relevant past medical, surgical, family and social history reviewed. Interim medical history since our last visit reviewed. Allergies and medications reviewed and updated.  History  Smoking Status  . Never Smoker  Smokeless Tobacco  . Never Used    ROS: Per HPI      Objective:    BP 120/86   Pulse 91   Temp 97.8 F (36.6 C) (Oral)   Ht 6\' 4"  (1.93 m)   Wt 218 lb 9.6 oz (99.2 kg)   BMI 26.61 kg/m   Wt Readings from Last 3 Encounters:  06/19/16 218 lb 9.6 oz (99.2 kg)  03/03/16 221 lb 9.6 oz (100.5 kg)  12/13/15 223 lb 12.8 oz (101.5 kg)     Gen: NAD, alert, cooperative with exam, NCAT EYES: EOMI, no conjunctival injection, or no icterus Ext: No edema, warm Neuro: Alert and oriented, strength equal b/l UE and LE, coordination grossly normal. Sensation intact to soft touch b/l feet. MSK: [place] ankles b/l,  No tenderness over 5th toe, R foot palpation. SKIN: R foot 5 toe with <651mm ulceration medial side. Some redness on toe, up to MCP joint.     Assessment & Plan:  [name] was seen today for foot pain and foot swelling.  Diagnoses and all orders for this visit:  Right foot pain No fractures -     DG Foot Complete Right; Future  Cellulitis of toe of right foot Treat for mild-mod diabetic foot infection Pt with no known MRSA No discharge to culture from wound. Pt has appt with podiatrist tomorrow -     cephALEXin (KEFLEX) 500 MG capsule; Take 1 capsule (500 mg total) by mouth 4 (four) times daily.  Need for hepatitis C screening test -     Hepatitis C antibody  Type 2 diabetes mellitus with diabetic neuropathy, with long-term current use of insulin (HCC) Increased insulin last DM2 visit. Taking meds regularly. Improved A1c today, to  7.4 Continue to check AM BGLs  Follow up plan: Return in about 4 weeks (around 07/17/2016) for Tammy DM2 appt.  Rex Krasarol Vincent, MD [facility] 06/19/2016, 12:46 PM

## 2016-06-19 NOTE — Telephone Encounter (Signed)
Letter is ready for pick-up.

## 2016-06-20 ENCOUNTER — Other Ambulatory Visit (HOSPITAL_COMMUNITY): Payer: Self-pay | Admitting: Podiatry

## 2016-06-20 ENCOUNTER — Other Ambulatory Visit: Payer: 59

## 2016-06-20 DIAGNOSIS — M869 Osteomyelitis, unspecified: Secondary | ICD-10-CM

## 2016-06-20 LAB — HEPATITIS C ANTIBODY: Hep C Virus Ab: <0.1 s/co ratio (ref 0.0–0.9)

## 2016-06-21 ENCOUNTER — Ambulatory Visit (HOSPITAL_COMMUNITY)
Admission: RE | Admit: 2016-06-21 | Discharge: 2016-06-21 | Disposition: A | Discharge status: Home / Self Care | Payer: 59 | Source: Ambulatory Visit | Attending: Podiatry | Admitting: Podiatry

## 2016-06-21 DIAGNOSIS — M25474 Effusion, right foot: Secondary | ICD-10-CM | POA: Diagnosis not present

## 2016-06-21 DIAGNOSIS — M869 Osteomyelitis, unspecified: Secondary | ICD-10-CM

## 2016-06-21 DIAGNOSIS — M868X7 Other osteomyelitis, ankle and foot: Secondary | ICD-10-CM | POA: Insufficient documentation

## 2016-06-21 MED ORDER — GADOBENATE DIMEGLUMINE 529 MG/ML IV SOLN
19.0000 mL | Freq: Once | INTRAVENOUS | Status: AC | PRN
  Administered 2016-06-21: 19 mL via INTRAVENOUS

## 2016-06-29 HISTORY — PX: AMPUTATION: SHX166

## 2016-07-02 ENCOUNTER — Other Ambulatory Visit: Payer: Self-pay | Admitting: Family Medicine

## 2016-07-06 ENCOUNTER — Encounter: Payer: Self-pay | Admitting: Family Medicine

## 2016-07-06 ENCOUNTER — Ambulatory Visit (INDEPENDENT_AMBULATORY_CARE_PROVIDER_SITE_OTHER): Payer: 59 | Admitting: Family Medicine

## 2016-07-06 VITALS — BP 131/88 | HR 90 | Temp 97.5°F | Ht 76.0 in | Wt 217.6 lb

## 2016-07-06 DIAGNOSIS — L97509 Non-pressure chronic ulcer of other part of unspecified foot with unspecified severity: Secondary | ICD-10-CM

## 2016-07-06 DIAGNOSIS — I10 Essential (primary) hypertension: Secondary | ICD-10-CM

## 2016-07-06 DIAGNOSIS — Z794 Long term (current) use of insulin: Secondary | ICD-10-CM | POA: Diagnosis not present

## 2016-07-06 DIAGNOSIS — M5136 Other intervertebral disc degeneration, lumbar region: Secondary | ICD-10-CM | POA: Diagnosis not present

## 2016-07-06 DIAGNOSIS — E11621 Type 2 diabetes mellitus with foot ulcer: Secondary | ICD-10-CM | POA: Diagnosis not present

## 2016-07-06 NOTE — Progress Notes (Signed)
Subjective:    Patient ID: [name], male    DOB: 23-Sep-1966, 50 y.o.   MRN: [id number]  HPI 50 year old gentleman with diabetes. Since his last visit here he's had a wound on the bottom of his foot and toe amputation that is being managed by podiatry. The wound is healing with Silvadene cream and patch. He had an A1c done 2 weeks ago was 7.4 down from greater than 10. Concern is the amputations on Invokana. I think it is still too early to say this is related to that particular drug or a class effect or just diabetes causing amputations. Pain is controlled with gabapentin. He does take hydrocodone about twice a week for pain.  Patient Active Problem List   Diagnosis Date Noted  . Polycythemia 12/09/2015  . Dizziness 12/09/2015  . Type 2 diabetes mellitus (HCC) 07/22/2015  . Splinter 06/18/2014  . Osteomyelitis of toe of right foot (HCC) 06/13/2014  . Osteomyelitis (HCC) 06/12/2014  . Neuropathy in diabetes (HCC) 03/31/2014  . Charcot ankle 03/09/2014  . Essential hypertension, benign 03/03/2013  . Diabetes (HCC) 03/03/2013  . DDD (degenerative disc disease), lumbar 03/03/2013   Outpatient Encounter Prescriptions as of 07/06/2016  Medication Sig  . aspirin (ASPIRIN EC) 81 MG EC tablet Take 81 mg by mouth daily. Swallow whole.  . DULoxetine (CYMBALTA) 30 MG capsule Take 1 capsule (30 mg total) by mouth daily.  Marland Kitchen. gabapentin (NEURONTIN) 600 MG tablet Take 1 tablet (600 mg total) by mouth at bedtime.  Marland Kitchen. glipiZIDE (GLUCOTROL) 5 MG tablet TAKE 1 TABLET (5 MG TOTAL) BY MOUTH 2 (TWO) TIMES DAILY BEFORE A MEAL.  Marland Kitchen. glucose blood (BAYER CONTOUR TEST) test strip TEST BLOOD SUGAR UP TO TWICE DAILY  . Insulin Glargine (BASAGLAR KWIKPEN) 100 UNIT/ML SOPN INJECT 0.25-0.35 MLS (25-35 UNITS TOTAL) INTO THE SKIN AT BEDTIME.  . Insulin Pen Needle (PEN NEEDLES) 32G X 4 MM MISC 1 each by Does not apply route daily. Use with Lantus Solostar to inject insulin once daily  . INVOKANA 300 MG TABS tablet  TAKE 1 TABLET DAILY BEFORE BREAKFAST  . lisinopril (PRINIVIL,ZESTRIL) 20 MG tablet TAKE 1 TABLET (20 MG TOTAL) BY MOUTH DAILY.  . metFORMIN (GLUCOPHAGE) 1000 MG tablet Take 1 tablet (1,000 mg total) by mouth 2 (two) times daily.  . Multiple Vitamin (MULTIVITAMIN WITH MINERALS) TABS tablet Take 1 tablet by mouth daily.  Marland Kitchen. HYDROcodone-acetaminophen (NORCO) 10-325 MG tablet Take 1 tablet by mouth every 6 (six) hours as needed for moderate pain. (Patient not taking: Reported on 07/06/2016)  . [DISCONTINUED] cephALEXin (KEFLEX) 500 MG capsule Take 1 capsule (500 mg total) by mouth 4 (four) times daily.   No facility-administered encounter medications on file as of 07/06/2016.       Review of Systems  Constitutional: Negative.   Eyes: Negative.   Respiratory: Negative.   Cardiovascular: Negative.   Musculoskeletal: Negative.   Psychiatric/Behavioral: Negative.        Objective:   Physical Exam  Constitutional: He is oriented to person, place, and time. He appears well-developed and well-nourished.  Eyes:  Head eye exam. By history unchanged from previous  Cardiovascular: Normal rate, regular rhythm and normal heart sounds.   Cannot palpate pulses  Pulmonary/Chest: Effort normal and breath sounds normal.  Neurological: He is alert and oriented to person, place, and time.  Psychiatric: He has a normal mood and affect. His behavior is normal.   BP 131/88 (BP Location: Right Arm, Patient Position: Sitting, Cuff Size: Large)  Pulse 90   Temp 97.5 F (36.4 C) (Oral)   Ht 6\' 4"  (1.93 m)   Wt 217 lb 9.6 oz (98.7 kg)   BMI 26.49 kg/m        Assessment & Plan:  1. Essential hypertension, benign Blood pressure is about work was at 131/88 on lisinopril  2. Type 2 diabetes mellitus with foot ulcer, with long-term current use of insulin (HCC) A1c has greatly improved on current regimen of long-acting insulin glipizide Invokana alignment metformin  3. DDD (degenerative disc disease),  lumbar Head toe amputation per podiatry. We discussed the use of Invokana and mutually decided to continue with that for now since his diabetes is otherwise doing so well.  Frederica Kuster MD

## 2016-07-11 ENCOUNTER — Encounter: Payer: Self-pay | Admitting: Family Medicine

## 2016-07-11 ENCOUNTER — Ambulatory Visit (INDEPENDENT_AMBULATORY_CARE_PROVIDER_SITE_OTHER): Payer: 59 | Admitting: Family Medicine

## 2016-07-11 VITALS — BP 134/77 | HR 96 | Temp 98.8°F | Ht 76.0 in | Wt 219.6 lb

## 2016-07-11 DIAGNOSIS — M109 Gout, unspecified: Secondary | ICD-10-CM

## 2016-07-11 DIAGNOSIS — R609 Edema, unspecified: Secondary | ICD-10-CM | POA: Diagnosis not present

## 2016-07-11 DIAGNOSIS — M1009 Idiopathic gout, multiple sites: Secondary | ICD-10-CM

## 2016-07-11 MED ORDER — COLCHICINE 0.6 MG PO TABS: ORAL_TABLET | ORAL | 1 refills | Status: DC

## 2016-07-11 MED ORDER — METHYLPREDNISOLONE ACETATE 80 MG/ML IJ SUSP
80.0000 mg | Freq: Once | INTRAMUSCULAR | Status: AC
  Administered 2016-07-11: 80 mg via INTRAMUSCULAR

## 2016-07-11 NOTE — Progress Notes (Signed)
   Subjective:    Patient ID: [name], male    DOB: 10-20-1966, 50 y.o.   MRN: [id number]  HPI sudden onset of left first finger in the joint pain and redness and swelling. He has no history of gout. He has been variously using icy hot warm soaks and ice packs without relief. Podiatrist, earlier this morning, told him he may have gout.  Patient Active Problem List   Diagnosis Date Noted  . Polycythemia 12/09/2015  . Dizziness 12/09/2015  . Type 2 diabetes mellitus (HCC) 07/22/2015  . Splinter 06/18/2014  . Osteomyelitis of toe of right foot (HCC) 06/13/2014  . Osteomyelitis (HCC) 06/12/2014  . Neuropathy in diabetes (HCC) 03/31/2014  . Charcot ankle 03/09/2014  . Essential hypertension, benign 03/03/2013  . Diabetes (HCC) 03/03/2013  . DDD (degenerative disc disease), lumbar 03/03/2013   Outpatient Encounter Prescriptions as of 07/11/2016  Medication Sig  . aspirin (ASPIRIN EC) 81 MG EC tablet Take 81 mg by mouth daily. Swallow whole.  . DULoxetine (CYMBALTA) 30 MG capsule Take 1 capsule (30 mg total) by mouth daily.  Marland Kitchen. gabapentin (NEURONTIN) 600 MG tablet Take 1 tablet (600 mg total) by mouth at bedtime.  Marland Kitchen. glipiZIDE (GLUCOTROL) 5 MG tablet TAKE 1 TABLET (5 MG TOTAL) BY MOUTH 2 (TWO) TIMES DAILY BEFORE A MEAL.  Marland Kitchen. glucose blood (BAYER CONTOUR TEST) test strip TEST BLOOD SUGAR UP TO TWICE DAILY  . HYDROcodone-acetaminophen (NORCO) 10-325 MG tablet Take 1 tablet by mouth every 6 (six) hours as needed for moderate pain.  . Insulin Glargine (BASAGLAR KWIKPEN) 100 UNIT/ML SOPN INJECT 0.25-0.35 MLS (25-35 UNITS TOTAL) INTO THE SKIN AT BEDTIME.  . Insulin Pen Needle (PEN NEEDLES) 32G X 4 MM MISC 1 each by Does not apply route daily. Use with Lantus Solostar to inject insulin once daily  . INVOKANA 300 MG TABS tablet TAKE 1 TABLET DAILY BEFORE BREAKFAST  . lisinopril (PRINIVIL,ZESTRIL) 20 MG tablet TAKE 1 TABLET (20 MG TOTAL) BY MOUTH DAILY.  . metFORMIN (GLUCOPHAGE) 1000 MG tablet Take 1  tablet (1,000 mg total) by mouth 2 (two) times daily.  . Multiple Vitamin (MULTIVITAMIN WITH MINERALS) TABS tablet Take 1 tablet by mouth daily.   No facility-administered encounter medications on file as of 07/11/2016.       Review of Systems  Musculoskeletal: Positive for arthralgias.  Skin: Positive for color change.       Objective:   Physical Exam  Constitutional: He appears well-developed and well-nourished.  Musculoskeletal:  Left hand: First MP joint is red and swollen and tender to the touch and warm to touch.   BP 134/77   Pulse 96   Temp 98.8 F (37.1 C) (Oral)   Ht 6\' 4"  (1.93 m)   Wt 219 lb 9.6 oz (99.6 kg)   BMI 26.73 kg/m         Assessment & Plan:  1. Edema, unspecified type I think this person has gout but we will see what the uric acid level is and how he responds to treatment - Uric acid - methylPREDNISolone acetate (DEPO-MEDROL) injection 80 mg; Inject 1 mL (80 mg total) into the muscle once. 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 Patient does have hydrocodone which he can take if needed. I've asked him to avoid NSAIDs and anything warm but ice packs are okay  Frederica KusterStephen M Miller MD

## 2016-07-11 NOTE — Addendum Note (Signed)
Addended by: Ardine EngAYTON, Tristan Proto A on: 07/11/2016 05:36 PM   Modules accepted: Orders

## 2016-07-12 LAB — URIC ACID: URIC ACID: 4.4 mg/dL (ref 3.7–8.6)

## 2016-07-13 ENCOUNTER — Telehealth: Payer: Self-pay | Admitting: Family Medicine

## 2016-07-13 NOTE — Telephone Encounter (Signed)
Pt needs to be seen. Pt's uric acid level was normal

## 2016-07-13 NOTE — Telephone Encounter (Signed)
[name] is [employer] coverage - what so you think?

## 2016-07-14 NOTE — Telephone Encounter (Signed)
Pt aware lab normal and NTBS - call for appt to follow up on edema

## 2016-07-15 ENCOUNTER — Encounter: Payer: Self-pay | Admitting: Nurse Practitioner

## 2016-07-15 ENCOUNTER — Ambulatory Visit (INDEPENDENT_AMBULATORY_CARE_PROVIDER_SITE_OTHER): Payer: 59 | Admitting: Nurse Practitioner

## 2016-07-15 VITALS — BP 139/84 | HR 83 | Temp 97.6°F | Ht 76.0 in | Wt 215.0 lb

## 2016-07-15 DIAGNOSIS — M79645 Pain in left finger(s): Secondary | ICD-10-CM | POA: Diagnosis not present

## 2016-07-15 MED ORDER — NAPROXEN 500 MG PO TABS: 500.0000 mg | ORAL_TABLET | Freq: Two times a day (BID) | ORAL | 1 refills | Status: DC

## 2016-07-15 NOTE — Patient Instructions (Signed)
Tendinitis °Tendinitis is swelling and inflammation of the tendons. Tendons are band-like tissues that connect muscle to bone. Tendinitis commonly occurs in the:  °· Shoulders (rotator cuff). °· Heels (Achilles tendon). °· Elbows (triceps tendon). °CAUSES °Tendinitis is usually caused by overusing the tendon, muscles, and joints involved. When the tissue surrounding a tendon (synovium) becomes inflamed, it is called tenosynovitis. Tendinitis commonly develops in people whose jobs require repetitive motions. °SYMPTOMS °· Pain. °· Tenderness. °· Mild swelling. °DIAGNOSIS °Tendinitis is usually diagnosed by physical exam. Your health care provider may also order X-rays or other imaging tests. °TREATMENT °Your health care provider may recommend certain medicines or exercises for your treatment. °HOME CARE INSTRUCTIONS  °· Use a sling or splint for as long as directed by your health care provider until the pain decreases. °· Put ice on the injured area. °¨ Put ice in a plastic bag. °¨ Place a towel between your skin and the bag. °¨ Leave the ice on for 15-20 minutes, 3-4 times a day, or as directed by your health care provider. °· Avoid using the limb while the tendon is painful. Perform gentle range of motion exercises only as directed by your health care provider. Stop exercises if pain or discomfort increase, unless directed otherwise by your health care provider. °· Only take over-the-counter or prescription medicines for pain, discomfort, or fever as directed by your health care provider. °SEEK MEDICAL CARE IF:  °· Your pain and swelling increase. °· You develop new, unexplained symptoms, especially increased numbness in the hands. °MAKE SURE YOU:  °· Understand these instructions. °· Will watch your condition. °· Will get help right away if you are not doing well or get worse. °  °This information is not intended to replace advice given to you by your health care provider. Make sure you discuss any questions you  have with your health care provider. °  °Document Released: 10/20/2000 Document Revised: 11/13/2014 Document Reviewed: 01/09/2011 °Elsevier Interactive Patient Education ©2016 [employer] ° °

## 2016-07-15 NOTE — Progress Notes (Signed)
   Subjective:    Patient ID: [name], male    DOB: 12/07/1965, 50 y.o.   MRN: [id number]  HPI Patient in today c/o hand pain- he saw Dr. Hyacinth MeekerMiller 07/11/16 and was told he had gout and was given a depomedrol injection. Uric acid level came back normal so was not gout. Patient is concerned because does not know what is going on and he can hardly move the thumb on his left hand.    Review of Systems  Constitutional: Negative.   HENT: Negative.   Respiratory: Negative.   Cardiovascular: Negative.   Genitourinary: Negative.   Neurological: Negative.   Psychiatric/Behavioral: Negative.   All other systems reviewed and are negative.      Objective:   Physical Exam  Constitutional: He appears well-developed and well-nourished. No distress.  Cardiovascular: Normal rate.   Pulmonary/Chest: Effort normal and breath sounds normal.  Musculoskeletal:  Pain in  proximal MIP joint in left thumb. Mild edema. Decreased ROM OF thumb with pain on extension.  Neurological: He is alert.  Skin: Skin is warm.  Psychiatric: He has a normal mood and affect. His behavior is normal. Judgment and thought content normal.   BP 139/84   Pulse 83   Temp 97.6 F (36.4 C) (Oral)   Ht 6\' 4"  (1.93 m)   Wt 215 lb (97.5 kg)   BMI 26.17 kg/m         Assessment & Plan:   1. Pain of left thumb    Meds ordered this encounter  Medications  . naproxen (NAPROSYN) 500 MG tablet    Sig: Take 1 tablet (500 mg total) by mouth 2 (two) times daily with a meal.    Dispense:  60 tablet    Refill:  1    Order Specific Question:   Supervising Provider    Answer:   Oswaldo DoneVINCENT, CAROL L [[id number]]   Orders Placed This Encounter  Procedures  . Ambulatory referral to Orthopedic Surgery    Referral Priority:   Routine    Referral Type:   Surgical    Referral Reason:   Specialty Services Required    Requested Specialty:   Orthopedic Surgery    Number of Visits Requested:   1   Soak in epsom salt  BID Rest Ice Compression sleeve for thumb RTO prn  Mary-Margaret Daphine DeutscherMartin, FNP

## 2016-07-20 ENCOUNTER — Telehealth: Payer: Self-pay | Admitting: Nurse Practitioner

## 2016-07-20 NOTE — Telephone Encounter (Signed)
Spoke to pt and advised we are working on the referral and as soon as we get the appt scheduled we will let him know. Pt voiced understanding.

## 2016-07-28 ENCOUNTER — Other Ambulatory Visit: Payer: Self-pay | Admitting: Family Medicine

## 2016-07-31 ENCOUNTER — Telehealth: Payer: Self-pay | Admitting: Family Medicine

## 2016-08-01 ENCOUNTER — Telehealth: Payer: Self-pay | Admitting: Family Medicine

## 2016-08-01 NOTE — Telephone Encounter (Signed)
scheduled

## 2016-08-01 NOTE — Telephone Encounter (Signed)
Patient aware that he will only need the flu shot

## 2016-08-05 ENCOUNTER — Ambulatory Visit (INDEPENDENT_AMBULATORY_CARE_PROVIDER_SITE_OTHER): Payer: 59 | Admitting: Family

## 2016-08-05 ENCOUNTER — Encounter: Payer: Self-pay | Admitting: Family

## 2016-08-05 ENCOUNTER — Ambulatory Visit (INDEPENDENT_AMBULATORY_CARE_PROVIDER_SITE_OTHER): Payer: 59

## 2016-08-05 VITALS — BP 138/88 | HR 91 | Temp 97.2°F | Ht 76.0 in | Wt 210.0 lb

## 2016-08-05 DIAGNOSIS — Z23 Encounter for immunization: Secondary | ICD-10-CM | POA: Diagnosis not present

## 2016-08-05 DIAGNOSIS — M25532 Pain in left wrist: Secondary | ICD-10-CM | POA: Diagnosis not present

## 2016-08-05 DIAGNOSIS — M25432 Effusion, left wrist: Secondary | ICD-10-CM | POA: Diagnosis not present

## 2016-08-05 MED ORDER — TRAMADOL HCL 50 MG PO TABS: 50.0000 mg | ORAL_TABLET | Freq: Three times a day (TID) | ORAL | 0 refills | Status: DC | PRN

## 2016-08-05 MED ORDER — PREDNISONE 10 MG (21) PO TBPK: ORAL_TABLET | ORAL | 0 refills | Status: DC

## 2016-08-05 NOTE — Progress Notes (Signed)
   Subjective:    Patient ID: France RavensKeith B Bishop, male    DOB: 1966-09-17, 50 y.o.   MRN: [id number]  PT presents to the office with wrist pain that started 4 weeks ago. PT has seen ortho and had an x-ray that showed inflammation, but no fracture. Pt's uric acid was 4.4.  Wrist Pain   The pain is present in the left wrist. This is a new problem. The current episode started 1 to 4 weeks ago. There has been no history of extremity trauma. The problem occurs constantly. The problem has been unchanged. The quality of the pain is described as aching. The pain is at a severity of 10/10. The pain is moderate. Associated symptoms include joint locking and joint swelling. Pertinent negatives include no inability to bear weight, itching, numbness, stiffness or tingling. He has tried NSAIDS, acetaminophen, heat, cold, rest and OTC pain meds for the symptoms. The treatment provided mild relief. Family history does not include gout. His past medical history is significant for osteoarthritis.      Review of Systems  Musculoskeletal: Positive for joint swelling. Negative for stiffness.  Skin: Negative for itching.  Neurological: Negative for tingling and numbness.  All other systems reviewed and are negative.      Objective:   Physical Exam  Constitutional: He is oriented to person, place, and time. He appears well-developed and well-nourished. No distress.  Cardiovascular: Normal rate, regular rhythm, normal heart sounds and intact distal pulses.   No murmur heard. Pulmonary/Chest: Effort normal and breath sounds normal. No respiratory distress. He has no wheezes.  Musculoskeletal: Normal range of motion. He exhibits edema and tenderness.  Neurological: He is alert and oriented to person, place, and time.  Skin: Skin is warm and dry. No rash noted. No erythema.  Psychiatric: He has a normal mood and affect. His behavior is normal. Judgment and thought content normal.  Vitals reviewed.    BP 138/88    Pulse 91   Temp 97.2 F (36.2 C) (Oral)   Ht 6\' 4"  (1.93 m)   Wt 210 lb (95.3 kg)   BMI 25.56 kg/m       Assessment & Plan:  1. Left wrist pain - Arthritis Panel - predniSONE (STERAPRED UNI-PAK 21 TAB) 10 MG (21) TBPK tablet; Use as directed  Dispense: 21 tablet; Refill: 0 - traMADol (ULTRAM) 50 MG tablet; Take 1-2 tablets (50-100 mg total) by mouth every 8 (eight) hours as needed.  Dispense: 60 tablet; Refill: 0  2. Wrist swelling, left - Arthritis Panel - predniSONE (STERAPRED UNI-PAK 21 TAB) 10 MG (21) TBPK tablet; Use as directed  Dispense: 21 tablet; Refill: 0 - traMADol (ULTRAM) 50 MG tablet; Take 1-2 tablets (50-100 mg total) by mouth every 8 (eight) hours as needed.  Dispense: 60 tablet; Refill: 0   Rest Ice Continue wrist splint and ROM exercises RTO prn and keep appt with ortho Jannifer Rodneyhristy Arlow Spiers, FNP

## 2016-08-05 NOTE — Patient Instructions (Signed)
Gout Gout is an inflammatory arthritis caused by a buildup of uric acid crystals in the joints. Uric acid is a chemical that is normally present in the blood. When the level of uric acid in the blood is too high it can form crystals that deposit in your joints and tissues. This causes joint redness, soreness, and swelling (inflammation). Repeat attacks are common. Over time, uric acid crystals can form into masses (tophi) near a joint, destroying bone and causing disfigurement. Gout is treatable and often preventable. CAUSES  The disease begins with elevated levels of uric acid in the blood. Uric acid is produced by your body when it breaks down a naturally found substance called purines. Certain foods you eat, such as meats and fish, contain high amounts of purines. Causes of an elevated uric acid level include:  Being passed down from parent to child (heredity).  Diseases that cause increased uric acid production (such as obesity, psoriasis, and certain cancers).  Excessive alcohol use.  Diet, especially diets rich in meat and seafood.  Medicines, including certain cancer-fighting medicines (chemotherapy), water pills (diuretics), and aspirin.  Chronic kidney disease. The kidneys are no longer able to remove uric acid well.  Problems with metabolism. Conditions strongly associated with gout include:  Obesity.  High blood pressure.  High cholesterol.  Diabetes. Not everyone with elevated uric acid levels gets gout. It is not understood why some people get gout and others do not. Surgery, joint injury, and eating too much of certain foods are some of the factors that can lead to gout attacks. SYMPTOMS   An attack of gout comes on quickly. It causes intense pain with redness, swelling, and warmth in a joint.  Fever can occur.  Often, only one joint is involved. Certain joints are more commonly involved:  Base of the big toe.  Knee.  Ankle.  Wrist.  Finger. Without  treatment, an attack usually goes away in a few days to weeks. Between attacks, you usually will not have symptoms, which is different from many other forms of arthritis. DIAGNOSIS  Your caregiver will suspect gout based on your symptoms and exam. In some cases, tests may be recommended. The tests may include:  Blood tests.  Urine tests.  X-rays.  Joint fluid exam. This exam requires a needle to remove fluid from the joint (arthrocentesis). Using a microscope, gout is confirmed when uric acid crystals are seen in the joint fluid. TREATMENT  There are two phases to gout treatment: treating the sudden onset (acute) attack and preventing attacks (prophylaxis).  Treatment of an Acute Attack.  Medicines are used. These include anti-inflammatory medicines or steroid medicines.  An injection of steroid medicine into the affected joint is sometimes necessary.  The painful joint is rested. Movement can worsen the arthritis.  You may use warm or cold treatments on painful joints, depending which works best for you.  Treatment to Prevent Attacks.  If you suffer from frequent gout attacks, your caregiver may advise preventive medicine. These medicines are started after the acute attack subsides. These medicines either help your kidneys eliminate uric acid from your body or decrease your uric acid production. You may need to stay on these medicines for a very long time.  The early phase of treatment with preventive medicine can be associated with an increase in acute gout attacks. For this reason, during the first few months of treatment, your caregiver may also advise you to take medicines usually used for acute gout treatment. Be sure you   understand your caregiver's directions. Your caregiver may make several adjustments to your medicine dose before these medicines are effective.  Discuss dietary treatment with your caregiver or dietitian. Alcohol and drinks high in sugar and fructose and foods  such as meat, poultry, and seafood can increase uric acid levels. Your caregiver or dietitian can advise you on drinks and foods that should be limited. HOME CARE INSTRUCTIONS   Do not take aspirin to relieve pain. This raises uric acid levels.  Only take over-the-counter or prescription medicines for pain, discomfort, or fever as directed by your caregiver.  Rest the joint as much as possible. When in bed, keep sheets and blankets off painful areas.  Keep the affected joint raised (elevated).  Apply warm or cold treatments to painful joints. Use of warm or cold treatments depends on which works best for you.  Use crutches if the painful joint is in your leg.  Drink enough fluids to keep your urine clear or pale yellow. This helps your body get rid of uric acid. Limit alcohol, sugary drinks, and fructose drinks.  Follow your dietary instructions. Pay careful attention to the amount of protein you eat. Your daily diet should emphasize fruits, vegetables, whole grains, and fat-free or low-fat milk products. Discuss the use of coffee, vitamin C, and cherries with your caregiver or dietitian. These may be helpful in lowering uric acid levels.  Maintain a healthy body weight. SEEK MEDICAL CARE IF:   You develop diarrhea, vomiting, or any side effects from medicines.  You do not feel better in 24 hours, or you are getting worse. SEEK IMMEDIATE MEDICAL CARE IF:   Your joint becomes suddenly more tender, and you have chills or a fever. MAKE SURE YOU:   Understand these instructions.  Will watch your condition.  Will get help right away if you are not doing well or get worse.   This information is not intended to replace advice given to you by your health care provider. Make sure you discuss any questions you have with your health care provider.   Document Released: 10/20/2000 Document Revised: 11/13/2014 Document Reviewed: 06/05/2012 Elsevier Interactive Patient Education 2016  [employer] Wrist Pain There are many things that can cause wrist pain. Some common causes include:  An injury to the wrist area, such as a sprain, strain, or fracture.  Overuse of the joint.  A condition that causes increased pressure on a nerve in the wrist (carpal tunnel syndrome).  Wear and tear of the joints that occurs with aging (osteoarthritis).  A variety of other types of arthritis. Sometimes, the cause of wrist pain is not known. The pain often goes away when you follow your health care provider's instructions for relieving pain at home. If your wrist pain continues, tests may need to be done to diagnose your condition. HOME CARE INSTRUCTIONS Pay attention to any changes in your symptoms. Take these actions to help with your pain:  Rest the wrist area for at least 48 hours or as told by your health care provider.  If directed, apply ice to the injured area:  Put ice in a plastic bag.  Place a towel between your skin and the bag.  Leave the ice on for 20 minutes, 2-3 times per day.  Keep your arm raised (elevated) above the level of your heart while you are sitting or lying down.  If a splint or elastic bandage has been applied, use it as told by your health care provider.  Remove the  splint or bandage only as told by your health care provider.  Loosen the splint or bandage if your fingers become numb or have a tingling feeling, or if they turn cold or blue.  Take over-the-counter and prescription medicines only as told by your health care provider.  Keep all follow-up visits as told by your health care provider. This is important. SEEK MEDICAL CARE IF:  Your pain is not helped by treatment.  Your pain gets worse. SEEK IMMEDIATE MEDICAL CARE IF:  Your fingers become swollen.  Your fingers turn white, very red, or cold and blue.  Your fingers are numb or have a tingling feeling.  You have difficulty moving your fingers.   This information is not  intended to replace advice given to you by your health care provider. Make sure you discuss any questions you have with your health care provider.   Document Released: 08/02/2005 Document Revised: 07/14/2015 Document Reviewed: 03/10/2015 Elsevier Interactive Patient Education [employer]

## 2016-08-06 LAB — ARTHRITIS PANEL
Basophils Absolute: 0 10*3/uL (ref 0.0–0.2)
Basos: 0 %
EOS (ABSOLUTE): 0 10*3/uL (ref 0.0–0.4)
EOS: 0 %
HEMATOCRIT: 49.3 % (ref 37.5–51.0)
HEMOGLOBIN: 16.5 g/dL (ref 12.6–17.7)
Immature Grans (Abs): 0 10*3/uL (ref 0.0–0.1)
Immature Granulocytes: 1 %
LYMPHS ABS: 1.5 10*3/uL (ref 0.7–3.1)
Lymphs: 20 %
MCH: 31 pg (ref 26.6–33.0)
MCHC: 33.5 g/dL (ref 31.5–35.7)
MCV: 93 fL (ref 79–97)
MONOCYTES: 7 %
MONOS ABS: 0.5 10*3/uL (ref 0.1–0.9)
NEUTROS ABS: 5.4 10*3/uL (ref 1.4–7.0)
Neutrophils: 72 %
Platelets: 220 10*3/uL (ref 150–379)
RBC: 5.32 x10E6/uL (ref 4.14–5.80)
RDW: 13.1 % (ref 12.3–15.4)
Rhuematoid fact SerPl-aCnc: <10 IU/mL (ref 0.0–13.9)
SED RATE: 3 mm/h (ref 0–30)
Uric Acid: 4.6 mg/dL (ref 3.7–8.6)
WBC: 7.5 10*3/uL (ref 3.4–10.8)

## 2016-08-08 ENCOUNTER — Ambulatory Visit: Payer: 59

## 2016-08-10 ENCOUNTER — Encounter: Payer: Self-pay | Admitting: Family Medicine

## 2016-08-10 ENCOUNTER — Ambulatory Visit (INDEPENDENT_AMBULATORY_CARE_PROVIDER_SITE_OTHER): Payer: 59 | Admitting: Family Medicine

## 2016-08-10 ENCOUNTER — Telehealth: Payer: Self-pay | Admitting: Family Medicine

## 2016-08-10 ENCOUNTER — Ambulatory Visit (INDEPENDENT_AMBULATORY_CARE_PROVIDER_SITE_OTHER): Payer: 59

## 2016-08-10 VITALS — BP 135/85 | HR 89 | Temp 98.0°F | Ht 76.0 in | Wt 210.4 lb

## 2016-08-10 DIAGNOSIS — M25532 Pain in left wrist: Secondary | ICD-10-CM

## 2016-08-10 NOTE — Progress Notes (Signed)
BP 135/85   Pulse 89   Temp 98 F (36.7 C) (Oral)   Ht 6\' 4"  (1.93 m)   Wt 210 lb 6.4 oz (95.4 kg)   BMI 25.61 kg/m    Subjective:    Patient ID: [name], male    DOB: 10-10-1966, 50 y.o.   MRN: [id number]  HPI: [name] is a 50 y.o. male presenting on 08/10/2016 for Left wrist pain (ongoing x 3-4 weeks, no injury, patient has been seen several times for this - last saw Christy on 08/05/16, has not taken Prednisone dose pack, did not know it was prescribed.  )   HPI Left wrist pain and thumb pain Patient comes in with worsening left wrist and thumb pain that's been going on for the past 4 weeks. He saw orthopedic about 3 weeks ago and had an x-ray which is on his phone that showed some degeneration in the first metacarpal carpal joint. On x-ray today the degeneration looks significantly worse along with the patient having pain going up into his forearm with both erythema and warmth surrounding that joint and going up into his forearm. He has had an arthritis panel within normal CRP and normal uric acid and a normal WBC. The pain has significantly inhibited his ability to perform his work functions with that hand and wrist. The pain is significantly worsened with flexion and extension of the wrist.  Relevant past medical, surgical, family and social history reviewed and updated as indicated. Interim medical history since our last visit reviewed. Allergies and medications reviewed and updated.  Review of Systems  Constitutional: Negative for chills and fever.  Eyes: Negative for discharge.  Respiratory: Negative for shortness of breath and wheezing.   Cardiovascular: Negative for chest pain and leg swelling.  Musculoskeletal: Positive for arthralgias and joint swelling. Negative for back pain and gait problem.  Skin: Positive for color change. Negative for rash.  All other systems reviewed and are negative.  Per HPI unless specifically indicated above     Objective:      BP 135/85   Pulse 89   Temp 98 F (36.7 C) (Oral)   Ht 6\' 4"  (1.93 m)   Wt 210 lb 6.4 oz (95.4 kg)   BMI 25.61 kg/m   Wt Readings from Last 3 Encounters:  08/10/16 210 lb 6.4 oz (95.4 kg)  08/05/16 210 lb (95.3 kg)  07/15/16 215 lb (97.5 kg)    Physical Exam  Constitutional: He is oriented to person, place, and time. He appears well-developed and well-nourished. No distress.  Eyes: Conjunctivae are normal. Right eye exhibits no discharge. No scleral icterus.  Cardiovascular: Normal rate, regular rhythm, normal heart sounds and intact distal pulses.   No murmur heard. Pulmonary/Chest: Effort normal and breath sounds normal. No respiratory distress. He has no wheezes.  Musculoskeletal: Normal range of motion. He exhibits no edema.       Left hand: He exhibits tenderness, bony tenderness and swelling. He exhibits normal range of motion. Normal sensation noted. Normal strength noted.       Hands: Neurological: He is alert and oriented to person, place, and time. Coordination normal.  Skin: Skin is warm and dry. No rash noted. He is not diaphoretic. There is erythema (Erythema and warmth overlying the joint).  Psychiatric: He has a normal mood and affect. His behavior is normal.  Nursing note and vitals reviewed.  Left wrist x-ray: Degenerative changes of the carpal metacarpal joint that's appear to be  worsened from his previous x-ray which she has a copy of on his phone. We cannot see the original images in our computer system, await final read per radiology    Assessment & Plan:   Problem List Items Addressed This Visit    None    Visit Diagnoses    Left wrist pain    -  Primary   Swelling and warmth and erythema in left wrist and first MCP joint. Has had rheumatologic workup and uric acid was normal and XR was of thumb not wrist   Relevant Orders   DG Wrist Complete Left (Completed)       Follow up plan: Return if symptoms worsen or fail to improve.  Counseling provided  for all of the vaccine components Orders Placed This Encounter  Procedures  . DG Wrist Complete Left    Arville CareJoshua Dettinger, MD [facility] 08/10/2016, 9:03 AM

## 2016-08-10 NOTE — Telephone Encounter (Signed)
Clarified patient is to stay out of work until after MRI results

## 2016-08-22 ENCOUNTER — Ambulatory Visit: Payer: 59 | Admitting: Pharmacist

## 2016-08-31 ENCOUNTER — Ambulatory Visit: Payer: 59 | Admitting: Pharmacist

## 2016-09-15 ENCOUNTER — Ambulatory Visit: Payer: 59 | Admitting: Family Medicine

## 2016-09-20 ENCOUNTER — Encounter: Payer: Self-pay | Admitting: Family Medicine

## 2016-09-20 ENCOUNTER — Ambulatory Visit (INDEPENDENT_AMBULATORY_CARE_PROVIDER_SITE_OTHER): Payer: 59 | Admitting: Family Medicine

## 2016-09-20 VITALS — BP 129/74 | HR 89 | Temp 97.4°F | Ht 76.0 in | Wt 226.0 lb

## 2016-09-20 DIAGNOSIS — E1142 Type 2 diabetes mellitus with diabetic polyneuropathy: Secondary | ICD-10-CM

## 2016-09-20 DIAGNOSIS — I10 Essential (primary) hypertension: Secondary | ICD-10-CM

## 2016-09-20 DIAGNOSIS — Z794 Long term (current) use of insulin: Secondary | ICD-10-CM | POA: Diagnosis not present

## 2016-09-20 DIAGNOSIS — E11621 Type 2 diabetes mellitus with foot ulcer: Secondary | ICD-10-CM

## 2016-09-20 DIAGNOSIS — S91301S Unspecified open wound, right foot, sequela: Secondary | ICD-10-CM

## 2016-09-20 DIAGNOSIS — L97509 Non-pressure chronic ulcer of other part of unspecified foot with unspecified severity: Secondary | ICD-10-CM

## 2016-09-20 DIAGNOSIS — Z0289 Encounter for other administrative examinations: Secondary | ICD-10-CM

## 2016-09-20 LAB — BAYER DCA HB A1C WAIVED: HB A1C (BAYER DCA - WAIVED): 7.3 % — ABNORMAL HIGH (ref <7.0)

## 2016-09-20 MED ORDER — HYDROCODONE-ACETAMINOPHEN 10-325 MG PO TABS: 1.0000 | ORAL_TABLET | Freq: Four times a day (QID) | ORAL | 0 refills | Status: DC | PRN

## 2016-09-20 NOTE — Progress Notes (Signed)
Subjective:    Patient ID: [name], male    DOB: 1966/10/03, 50 y.o.   MRN: [id number]  HPI since his last visit here he has had wrist surgery. It sounds like he probably had osteomyelitis. He tells me he had a staph infection and he is still taking an antibiotic for that. He has follow-up appointment with infectious his disease and then orthopedics after that. He has gained some 16 pounds in the last month or so. Last A1c was 7.4 but that is down from 11+. He has had some episodes that sound like hypoglycemia and I have taken him off of glipizide as of today. He will continue with Invokana Lantus insulin and metformin. He still requests refill hydrocodone that he takes when necessary when he is on his feet a lot. He tells me he takes about 2-3 per week  Patient Active Problem List   Diagnosis Date Noted  . Polycythemia 12/09/2015  . Dizziness 12/09/2015  . Type 2 diabetes mellitus (HCC) 07/22/2015  . Splinter 06/18/2014  . Osteomyelitis of toe of right foot (HCC) 06/13/2014  . Osteomyelitis (HCC) 06/12/2014  . Neuropathy in diabetes (HCC) 03/31/2014  . Charcot ankle 03/09/2014  . Essential hypertension, benign 03/03/2013  . Diabetes (HCC) 03/03/2013  . DDD (degenerative disc disease), lumbar 03/03/2013   Outpatient Encounter Prescriptions as of 09/20/2016  Medication Sig  . aspirin (ASPIRIN EC) 81 MG EC tablet Take 81 mg by mouth daily. Swallow whole.  . colchicine 0.6 MG tablet Take 2 tablets  and 1 tab 1 hour later  . DULoxetine (CYMBALTA) 30 MG capsule Take 1 capsule (30 mg total) by mouth daily.  Marland Kitchen. gabapentin (NEURONTIN) 600 MG tablet Take 1 tablet (600 mg total) by mouth at bedtime.  Marland Kitchen. glipiZIDE (GLUCOTROL) 5 MG tablet TAKE 1 TABLET (5 MG TOTAL) BY MOUTH 2 (TWO) TIMES DAILY BEFORE A MEAL.  Marland Kitchen. glucose blood (BAYER CONTOUR TEST) test strip TEST BLOOD SUGAR UP TO TWICE DAILY  . HYDROcodone-acetaminophen (NORCO) 10-325 MG tablet Take 1 tablet by mouth every 6 (six) hours as  needed for moderate pain.  . Insulin Glargine (BASAGLAR KWIKPEN) 100 UNIT/ML SOPN INJECT 0.25-0.35 MLS (25-35 UNITS TOTAL) INTO THE SKIN AT BEDTIME.  . Insulin Pen Needle (PEN NEEDLES) 32G X 4 MM MISC 1 each by Does not apply route daily. Use with Lantus Solostar to inject insulin once daily  . INVOKANA 300 MG TABS tablet TAKE 1 TABLET DAILY BEFORE BREAKFAST  . lisinopril (PRINIVIL,ZESTRIL) 20 MG tablet TAKE 1 TABLET (20 MG TOTAL) BY MOUTH DAILY.  . metFORMIN (GLUCOPHAGE) 1000 MG tablet Take 1 tablet (1,000 mg total) by mouth 2 (two) times daily.  . Multiple Vitamin (MULTIVITAMIN WITH MINERALS) TABS tablet Take 1 tablet by mouth daily.  . naproxen (NAPROSYN) 500 MG tablet Take 1 tablet (500 mg total) by mouth 2 (two) times daily with a meal.  . predniSONE (STERAPRED UNI-PAK 21 TAB) 10 MG (21) TBPK tablet Use as directed  . traMADol (ULTRAM) 50 MG tablet Take 1-2 tablets (50-100 mg total) by mouth every 8 (eight) hours as needed.   No facility-administered encounter medications on file as of 09/20/2016.       Review of Systems  Constitutional: Negative.   Respiratory: Negative.   Cardiovascular: Negative.   Musculoskeletal: Positive for arthralgias.  Psychiatric/Behavioral: Negative.        Objective:   Physical Exam  Constitutional: He is oriented to person, place, and time. He appears well-developed and well-nourished.  Cardiovascular: Normal rate, regular rhythm and normal heart sounds.   Pulmonary/Chest: Effort normal and breath sounds normal.  Neurological: He is alert and oriented to person, place, and time.  Psychiatric: He has a normal mood and affect.   BP 129/74   Pulse 89   Temp 97.4 F (36.3 C) (Oral)   Ht 6\' 4"  (1.93 m)   Wt 226 lb (102.5 kg)   BMI 27.51 kg/m         Assessment & Plan:  1. Wound, open, foot with complication, right, sequela Feet are doing okay. Still has pain and takes gabapentin and also depends on hydrocodone - HYDROcodone-acetaminophen  (NORCO) 10-325 MG tablet; Take 1 tablet by mouth every 6 (six) hours as needed for moderate pain.  Dispense: 60 tablet; Refill: 0  2. Type 2 diabetes mellitus with foot ulcer, with long-term current use of insulin (HCC) Repeat A1c today hopefully it still will be in the 7 range - Bayer DCA Hb A1c Waived - Microalbumin / creatinine urine ratio  3. Pain management contract signed Continue with hydrocodone at 2-3 per week per his history  4. Essential hypertension, benign Clydene Pughsher well controlled on current regimen. Continue same  5. Diabetic polyneuropathy associated with type 2 diabetes mellitus (HCC) Continue gabapentin and good control of diabetes  Frederica KusterStephen M Miller MD

## 2016-09-21 LAB — MICROALBUMIN / CREATININE URINE RATIO
Creatinine, Urine: 83.5 mg/dL
Microalb/Creat Ratio: 4.6 mg/g creat (ref 0.0–30.0)
Microalbumin, Urine: 3.8 ug/mL

## 2016-09-30 ENCOUNTER — Other Ambulatory Visit: Payer: Self-pay | Admitting: Pediatrics

## 2016-10-02 ENCOUNTER — Ambulatory Visit (INDEPENDENT_AMBULATORY_CARE_PROVIDER_SITE_OTHER): Payer: 59 | Admitting: Family

## 2016-10-02 ENCOUNTER — Encounter: Payer: Self-pay | Admitting: Family

## 2016-10-02 VITALS — BP 133/78 | HR 92 | Temp 97.2°F | Ht 76.0 in | Wt 219.0 lb

## 2016-10-02 DIAGNOSIS — E1142 Type 2 diabetes mellitus with diabetic polyneuropathy: Secondary | ICD-10-CM | POA: Diagnosis not present

## 2016-10-02 DIAGNOSIS — R609 Edema, unspecified: Secondary | ICD-10-CM

## 2016-10-02 MED ORDER — FUROSEMIDE 20 MG PO TABS: 20.0000 mg | ORAL_TABLET | Freq: Every day | ORAL | 3 refills | Status: DC | PRN

## 2016-10-02 NOTE — Patient Instructions (Signed)

## 2016-10-02 NOTE — Progress Notes (Signed)
   Subjective:    Patient ID: [name], male    DOB: 04/15/1966, 50 y.o.   MRN: [id number]  HPI Pt presents to the office today with bilateral ankle edema that started a few months, but has become worse. PT states he has diabetic neuropathy and takes gabapentin & Norco for the pain. PT states this seems to help. Pt states the swelling is worse in his right ankle and worse after he has been working all day.    Review of Systems  Cardiovascular: Positive for leg swelling. Palpitations: ankle swelling.  All other systems reviewed and are negative.      Objective:   Physical Exam  Constitutional: He is oriented to person, place, and time. He appears well-developed and well-nourished.  Neck: Normal range of motion. Neck supple.  Cardiovascular: Normal rate, regular rhythm, normal heart sounds and intact distal pulses.   Pulmonary/Chest: Effort normal and breath sounds normal.  Musculoskeletal: He exhibits edema (trace edema in bilateral medial ankles ).  Neurological: He is alert and oriented to person, place, and time.  Skin: Skin is warm and dry.  Psychiatric: He has a normal mood and affect. His behavior is normal. Judgment and thought content normal.      BP 133/78   Pulse 92   Temp 97.2 F (36.2 C) (Oral)   Ht 6\' 4"  (1.93 m)   Wt 219 lb (99.3 kg)   BMI 26.66 kg/m      Assessment & Plan:  1. Diabetic polyneuropathy associated with type 2 diabetes mellitus (HCC) -Continue gabapentin   2. Peripheral edema -Wear compression hose daily -Low salt diet -Take lasix only as needed for swelling in ankles -Keep appts with ortho - furosemide (LASIX) 20 MG tablet; Take 1 tablet (20 mg total) by mouth daily as needed.  Dispense: 30 tablet; Refill: 3  Jannifer Rodneyhristy Margarite Vessel, FNP   Jannifer Rodneyhristy Tryton Bodi, [place]

## 2016-10-05 ENCOUNTER — Encounter: Payer: Self-pay | Admitting: Internal Medicine

## 2016-10-05 ENCOUNTER — Ambulatory Visit (INDEPENDENT_AMBULATORY_CARE_PROVIDER_SITE_OTHER): Payer: 59 | Admitting: Internal Medicine

## 2016-10-05 VITALS — BP 137/87 | HR 88 | Temp 98.1°F | Ht 76.0 in | Wt 218.0 lb

## 2016-10-05 DIAGNOSIS — M86042 Acute hematogenous osteomyelitis, left hand: Secondary | ICD-10-CM

## 2016-10-05 NOTE — Progress Notes (Signed)
[place] [facility]      Reason for Consult: osteomyelitis of thumb    Referring Physician: Dr. Caralyn Guile    Patient ID: [name], male    DOB: [birth year], 50 y.o.   MRN: [id number]  HPI:   He comes in for evaluation following osteomyelitis of thumb.  He first developed swelling of his left arm after sleeping wrong and noted it was swollen from thumb up his forearm.  He saw his PCP and was given antibiotics and used ice but persisted.  This continued for about 1 month and he was referred to Dr. Caralyn Guile.  He did an MRI and noted septic arthritis of CMC joint of the left thumb with osteomyelitis and involvment of the extensor pollicis brevis and abductor pollicis longus tendon sheaths.  On 08/17/2016 he went to the OR and underwent bone excision and debridement and post op was on clindamycin.  Culture grew MRSA clindamycin resistant and was changed to doxycycline.  It appears he was given a 10 day course but he tells me he continued on antibiotics since.  He presents now with no complaints, thumb without pain, no erythema, no swelling, normal movement.  Incision healed.  No fever, no chills, no diarrhea. Recently diagnosed with diabetes and improving A1C.  MRI independently reviewed and area of infection noted. Previous record reviewed Dr. Angus Palms operative note.  Some purulent fluid noted.    Past Medical History:  Diagnosis Date  . Boil    numerous boils in past  . Diabetes mellitus   . Hypertension   . Neuropathy associated with endocrine disorder [facility])     Prior to Admission medications   Medication Sig Start Date End Date Taking? Authorizing Provider  aspirin (ASPIRIN EC) 81 MG EC tablet Take 81 mg by mouth daily. Swallow whole.   Yes Historical Provider, MD  colchicine 0.6 MG tablet Take 2 tablets  and 1 tab 1 hour later 07/11/16  Yes Wardell Honour, MD  DULoxetine (CYMBALTA) 30 MG capsule Take 1 capsule (30 mg total) by mouth daily. 12/14/15  Yes Timmothy Euler, MD  furosemide (LASIX) 20 MG tablet Take 1 tablet (20 mg total) by mouth daily as needed. 10/02/16  Yes Sharion Balloon, FNP  gabapentin (NEURONTIN) 600 MG tablet Take 1 tablet (600 mg total) by mouth at bedtime. 12/14/15  Yes Timmothy Euler, MD  glipiZIDE (GLUCOTROL) 5 MG tablet TAKE 1 TABLET (5 MG TOTAL) BY MOUTH 2 (TWO) TIMES DAILY BEFORE A MEAL. 07/28/16  Yes Wardell Honour, MD  glucose blood (BAYER CONTOUR TEST) test strip TEST BLOOD SUGAR UP TO TWICE DAILY 09/30/14  Yes Lysbeth Penner, FNP  HYDROcodone-acetaminophen (NORCO) 10-325 MG tablet Take 1 tablet by mouth every 6 (six) hours as needed for moderate pain. 09/20/16  Yes Wardell Honour, MD  Insulin Glargine [facility] KWIKPEN) 100 UNIT/ML SOPN INJECT 0.25-0.35 MLS (25-35 UNITS TOTAL) INTO THE SKIN AT BEDTIME. 10/02/16  Yes Wardell Honour, MD  Insulin Pen Needle (PEN NEEDLES) 32G X 4 MM MISC 1 each by Does not apply route daily. Use with Lantus Solostar to inject insulin once daily 05/06/15  Yes Cherre Robins, PharmD  INVOKANA 300 MG TABS tablet TAKE 1 TABLET DAILY BEFORE BREAKFAST 07/03/16  Yes Wardell Honour, MD  lisinopril (PRINIVIL,ZESTRIL) 20 MG tablet TAKE 1 TABLET (20 MG TOTAL) BY MOUTH DAILY. 04/19/15  Yes Wardell Honour, MD  metFORMIN (GLUCOPHAGE) 1000 MG tablet Take 1 tablet (1,000  mg total) by mouth 2 (two) times daily. 10/15/15  Yes Cherre Robins, PharmD  Multiple Vitamin (MULTIVITAMIN WITH MINERALS) TABS tablet Take 1 tablet by mouth daily.   Yes Historical Provider, MD  naproxen (NAPROSYN) 500 MG tablet Take 1 tablet (500 mg total) by mouth 2 (two) times daily with a meal. 07/15/16  Yes Mary-Margaret Hassell Done, FNP    Allergies  Allergen Reactions  . Penicillins Other (See Comments)    Has patient had a PCN reaction causing immediate rash, facial/tongue/throat swelling, SOB or lightheadedness with hypotension: No Has patient had a PCN reaction causing severe rash involving mucus membranes or skin necrosis: No Has  patient had a PCN reaction that required hospitalization No Has patient had a PCN reaction occurring within the last 10 years: No If all of the above answers are "NO", then may proceed with Cephalosporin use.     Social History  Substance Use Topics  . Smoking status: Never Smoker  . Smokeless tobacco: Never Used  . Alcohol use No    Family History  Problem Relation Age of Onset  . Stroke Father   . Diabetes Brother   . Stroke Brother   . Diabetes Mother     Review of Systems  Constitutional: negative for fevers and chills Gastrointestinal: negative for nausea and diarrhea Neurological: negative for headaches All other systems reviewed and are negative    Constitutional: in no apparent distress and alert  Vitals:   10/05/16 0842  BP: 137/87  Pulse: 88  Temp: 98.1 F (36.7 C)   EYES: anicteric ENMT: no thrush Cardiovascular: Cor RRR Respiratory: CT AB; normal respiratory effort GI: Bowel sounds are normal, liver is not enlarged, spleen is not enlarged Musculoskeletal: left thumb with no edema, no warmth, no erythema.  Normal ROM.  Incision healed.  Skin: negatives: no rash Hematologic: no cervical lad  Labs: Lab Results  Component Value Date   WBC 7.5 08/05/2016   HGB 16.0 09/17/2015   HCT 49.3 08/05/2016   MCV 93 08/05/2016   PLT 220 08/05/2016    Lab Results  Component Value Date   CREATININE 0.94 03/03/2016   BUN 14 03/03/2016   NA 142 03/03/2016   K 4.8 03/03/2016   CL 100 03/03/2016   CO2 27 03/03/2016    Lab Results  Component Value Date   ALT 28 03/03/2016   AST 18 03/03/2016   ALKPHOS 143 (H) 03/03/2016   BILITOT 1.0 03/03/2016     Assessment: osteomyeltis and septic arthritis of thumb.  At this point, it clinically appears healed and no current concerns for infection.  I will check inflammatory markers and if no significant issues, no indication for further treatment.  I discussed concerns including new swelling, pain and to let me know if  this appears.   Plan: 1) observe off of antibiotics 2) continued improvement of sugar management.  Will call if there are concerns with CRP or ESR, otherwise, rtc PRN

## 2016-10-06 LAB — C-REACTIVE PROTEIN: CRP: 3 mg/L (ref <8.0)

## 2016-10-06 LAB — SEDIMENTATION RATE: Sed Rate: 1 mm/hr (ref 0–20)

## 2016-10-23 ENCOUNTER — Other Ambulatory Visit: Payer: Self-pay | Admitting: Family Medicine

## 2016-10-23 ENCOUNTER — Other Ambulatory Visit: Payer: Self-pay | Admitting: Pharmacist

## 2016-11-24 ENCOUNTER — Telehealth: Payer: Self-pay | Admitting: Family Medicine

## 2016-11-24 ENCOUNTER — Ambulatory Visit (INDEPENDENT_AMBULATORY_CARE_PROVIDER_SITE_OTHER): Payer: 59 | Admitting: Family Medicine

## 2016-11-24 ENCOUNTER — Encounter: Payer: Self-pay | Admitting: Family Medicine

## 2016-11-24 VITALS — BP 131/78 | HR 107 | Temp 98.1°F | Ht 76.0 in | Wt 219.0 lb

## 2016-11-24 DIAGNOSIS — H6522 Chronic serous otitis media, left ear: Secondary | ICD-10-CM

## 2016-11-24 DIAGNOSIS — S91301S Unspecified open wound, right foot, sequela: Secondary | ICD-10-CM

## 2016-11-24 DIAGNOSIS — W57XXXA Bitten or stung by nonvenomous insect and other nonvenomous arthropods, initial encounter: Secondary | ICD-10-CM

## 2016-11-24 MED ORDER — HYDROCODONE-ACETAMINOPHEN 10-325 MG PO TABS: 1.0000 | ORAL_TABLET | Freq: Four times a day (QID) | ORAL | 0 refills | Status: DC | PRN

## 2016-11-24 MED ORDER — FLUTICASONE PROPIONATE 50 MCG/ACT NA SUSP: 2.0000 | Freq: Every day | NASAL | 6 refills | Status: DC

## 2016-11-24 MED ORDER — MUPIROCIN 2 % EX OINT: 1.0000 "application " | TOPICAL_OINTMENT | Freq: Two times a day (BID) | CUTANEOUS | 0 refills | Status: DC

## 2016-11-24 NOTE — Progress Notes (Signed)
Subjective:    Patient ID: [name], male    DOB: [birth year], 51 y.o.   MRN: [id number]  HPI Patient here today for 3 issues which includes ear pressure, bump on his leg and pain med refills.  The bump on his leg is 33 days old some posterior part of his right leg there is some pain associated with it. He is also requesting refill his hydrocodone he takes this when necessary for foot pain. Where he works he has to spend lots of time on his feet and walks and when he does that he takes the hydrocodone for pain in his feet. His diabetic with neuropathy. Also complains of a feeling of some fullness or fluid in his left ear. Is no pain.    Patient Active Problem List   Diagnosis Date Noted  . Peripheral edema 10/02/2016  . Polycythemia 12/09/2015  . Dizziness 12/09/2015  . Type 2 diabetes mellitus (HCC) 07/22/2015  . Splinter 06/18/2014  . Osteomyelitis of toe of right foot (HCC) 06/13/2014  . Osteomyelitis (HCC) 06/12/2014  . Neuropathy in diabetes (HCC) 03/31/2014  . Charcot ankle 03/09/2014  . Essential hypertension, benign 03/03/2013  . Diabetes (HCC) 03/03/2013  . DDD (degenerative disc disease), lumbar 03/03/2013   Outpatient Encounter Prescriptions as of 11/24/2016  Medication Sig  . aspirin (ASPIRIN EC) 81 MG EC tablet Take 81 mg by mouth daily. Swallow whole.  . colchicine 0.6 MG tablet Take 2 tablets  and 1 tab 1 hour later  . DULoxetine (CYMBALTA) 30 MG capsule Take 1 capsule (30 mg total) by mouth daily.  . furosemide (LASIX) 20 MG tablet Take 1 tablet (20 mg total) by mouth daily as needed.  . gabapentin (NEURONTIN) 600 MG tablet Take 1 tablet (600 mg total) by mouth at bedtime.  Marland Kitchen glipiZIDE (GLUCOTROL) 5 MG tablet TAKE 1 TABLET (5 MG TOTAL) BY MOUTH 2 (TWO) TIMES DAILY BEFORE A MEAL.  Marland Kitchen glucose blood (BAYER CONTOUR TEST) test strip TEST BLOOD SUGAR UP TO TWICE DAILY  . HYDROcodone-acetaminophen (NORCO) 10-325 MG tablet Take 1 tablet by mouth every 6 (six) hours as  needed for moderate pain.  . Insulin Glargine (BASAGLAR KWIKPEN) 100 UNIT/ML SOPN INJECT 0.25-0.35 MLS (25-35 UNITS TOTAL) INTO THE SKIN AT BEDTIME.  . Insulin Pen Needle (PEN NEEDLES) 32G X 4 MM MISC 1 each by Does not apply route daily. Use with Lantus Solostar to inject insulin once daily  . INVOKANA 300 MG TABS tablet TAKE 1 TABLET DAILY BEFORE BREAKFAST  . lisinopril (PRINIVIL,ZESTRIL) 20 MG tablet TAKE 1 TABLET (20 MG TOTAL) BY MOUTH DAILY.  . metFORMIN (GLUCOPHAGE) 1000 MG tablet TAKE 1 TABLET (1,000 MG TOTAL) BY MOUTH 2 (TWO) TIMES DAILY.  . Multiple Vitamin (MULTIVITAMIN WITH MINERALS) TABS tablet Take 1 tablet by mouth daily.  . naproxen (NAPROSYN) 500 MG tablet Take 1 tablet (500 mg total) by mouth 2 (two) times daily with a meal.   No facility-administered encounter medications on file as of 11/24/2016.      Review of Systems  Constitutional: Negative.   HENT: Positive for ear pain (left ).   Eyes: Negative.   Respiratory: Negative.   Cardiovascular: Negative.   Gastrointestinal: Negative.   Endocrine: Negative.   Genitourinary: Negative.   Musculoskeletal: Negative.   Skin: Negative.        Bump on right leg  Allergic/Immunologic: Negative.   Neurological: Negative.   Hematological: Negative.   Psychiatric/Behavioral: Negative.        Objective:  Physical Exam  Constitutional: He appears well-developed and well-nourished.  HENT:  Head: Normocephalic.  Both tympanic membranes are dull consistent with effusion  Skin:  There is a bite on posterior aspect of right leg: There is a scab in the center of it. It is indurated. There is no cellulitis.   BP 131/78 (BP Location: Left Arm)   Pulse (!) 107   Temp 98.1 F (36.7 C) (Oral)   Ht 6\' 4"  (1.93 m)   Wt 219 lb (99.3 kg)   BMI 26.66 kg/m         Assessment & Plan:  1. Wound, open, foot with complication, right, sequela He is followed by podiatrist. Requesting diabetic shoes. States that podiatrist will  send over form to sign for shoes. - HYDROcodone-acetaminophen (NORCO) 10-325 MG tablet; Take 1 tablet by mouth every 6 (six) hours as needed for moderate pain.  Dispense: 60 tablet; Refill: 0  2. Insect bite, initial encounter Suspicion is by her bite. No obvious cellulitis. Will treat with Bactroban ointment to be applied twice a day  3. Left chronic serous otitis media Do Valsalva and ear exercises. Flonase 1 puff each nostril daily  Frederica KusterStephen M Miller MD

## 2016-11-24 NOTE — Telephone Encounter (Signed)
Pt aware  done

## 2016-11-28 ENCOUNTER — Telehealth: Payer: Self-pay | Admitting: Family Medicine

## 2016-11-28 NOTE — Telephone Encounter (Signed)
Attempted to leave message, mailbox full. Forms were faxed to Podiatrist today

## 2016-11-29 ENCOUNTER — Other Ambulatory Visit: Payer: Self-pay | Admitting: Pharmacist

## 2016-12-27 ENCOUNTER — Other Ambulatory Visit: Payer: Self-pay | Admitting: Family Medicine

## 2016-12-29 ENCOUNTER — Telehealth: Payer: Self-pay | Admitting: Family Medicine

## 2016-12-29 NOTE — Telephone Encounter (Signed)
error 

## 2017-01-05 ENCOUNTER — Encounter: Payer: BLUE CROSS/BLUE SHIELD | Admitting: Family Medicine

## 2017-01-08 ENCOUNTER — Encounter: Payer: Self-pay | Admitting: Family Medicine

## 2017-01-08 ENCOUNTER — Ambulatory Visit (INDEPENDENT_AMBULATORY_CARE_PROVIDER_SITE_OTHER): Payer: BLUE CROSS/BLUE SHIELD | Admitting: Family Medicine

## 2017-01-08 VITALS — BP 117/80 | HR 110 | Temp 97.8°F | Ht 76.0 in | Wt 218.6 lb

## 2017-01-08 DIAGNOSIS — M7551 Bursitis of right shoulder: Secondary | ICD-10-CM

## 2017-01-08 MED ORDER — METHYLPREDNISOLONE ACETATE 80 MG/ML IJ SUSP
80.0000 mg | Freq: Once | INTRAMUSCULAR | Status: AC
  Administered 2017-01-08: 80 mg via INTRAMUSCULAR

## 2017-01-08 NOTE — Progress Notes (Signed)
BP 117/80   Pulse (!) 110   Temp 97.8 F (36.6 C) (Oral)   Ht 6\' 4"  (1.93 m)   Wt 218 lb 9.6 oz (99.2 kg)   BMI 26.61 kg/m    Subjective:    Patient ID: [name], male    DOB: 1966/08/19, 51 y.o.   MRN: [id number]  HPI: [name] is a 51 y.o. male presenting on 01/08/2017 for Right shoulder pain (no injury, began 4 weeks ago, pain has worsened)   HPI Right shoulder pain Patient has been having right shoulder pain for the past 4.5 weeks. He says it's worse with overhead movements and range of motion and also does bother him when he sleeps on that side. He denies any numbness or weakness in that arm. The pain is right in the anterior part in the middle of his shoulder. He feels like he gets pinched and he can't raise his arm up over his head. He does not recall any specific incident that caused this injury but does do a lot of movement and range of motion with that arm.  Relevant past medical, surgical, family and social history reviewed and updated as indicated. Interim medical history since our last visit reviewed. Allergies and medications reviewed and updated.  Review of Systems  Constitutional: Negative for chills and fever.  Eyes: Negative for discharge.  Respiratory: Negative for shortness of breath and wheezing.   Cardiovascular: Negative for chest pain and leg swelling.  Musculoskeletal: Positive for arthralgias. Negative for back pain, gait problem and joint swelling.  Skin: Negative for rash.  Neurological: Negative for weakness and numbness.  All other systems reviewed and are negative.   Per HPI unless specifically indicated above     Objective:    BP 117/80   Pulse (!) 110   Temp 97.8 F (36.6 C) (Oral)   Ht 6\' 4"  (1.93 m)   Wt 218 lb 9.6 oz (99.2 kg)   BMI 26.61 kg/m   Wt Readings from Last 3 Encounters:  01/08/17 218 lb 9.6 oz (99.2 kg)  11/24/16 219 lb (99.3 kg)  10/05/16 218 lb (98.9 kg)    Physical Exam  Constitutional: He is oriented  to person, place, and time. He appears well-developed and well-nourished. No distress.  Eyes: Conjunctivae are normal. No scleral icterus.  Musculoskeletal: Normal range of motion. He exhibits tenderness (Anterior and lateral tenderness on right shoulder). He exhibits no edema.  Neurological: He is alert and oriented to person, place, and time. Coordination normal.  Skin: Skin is warm and dry. No rash noted. He is not diaphoretic.  Psychiatric: He has a normal mood and affect. His behavior is normal.  Nursing note and vitals reviewed.  Subacromial right injection: Risk factors of bleeding and infection discussed with patient and patient is agreeable towards injection. Patient prepped with Betadine. Lateral approach towards injection used. Injected 80 mg of Depo-Medrol and 1 mL of 2% lidocaine. Patient tolerated procedure well and no side effects from noted. Minimal to no bleeding. Simple bandage applied after.     Assessment & Plan:   Problem List Items Addressed This Visit    None    Visit Diagnoses    Subacromial bursitis of right shoulder joint    -  Primary   Relevant Medications   methylPREDNISolone acetate (DEPO-MEDROL) injection 80 mg (Start on 01/08/2017  8:30 AM)       Follow up plan: Return if symptoms worsen or fail to improve.  Counseling provided  for all of the vaccine components No orders of the defined types were placed in this encounter.   Arville Care, MD [facility] 01/08/2017, 8:22 AM

## 2017-01-11 ENCOUNTER — Ambulatory Visit (INDEPENDENT_AMBULATORY_CARE_PROVIDER_SITE_OTHER): Payer: BLUE CROSS/BLUE SHIELD | Admitting: Family Medicine

## 2017-01-11 ENCOUNTER — Encounter: Payer: Self-pay | Admitting: Family Medicine

## 2017-01-11 VITALS — BP 125/77 | HR 89 | Temp 97.1°F | Ht 76.0 in | Wt 219.6 lb

## 2017-01-11 DIAGNOSIS — Z794 Long term (current) use of insulin: Secondary | ICD-10-CM

## 2017-01-11 DIAGNOSIS — Z0000 Encounter for general adult medical examination without abnormal findings: Secondary | ICD-10-CM

## 2017-01-11 DIAGNOSIS — I10 Essential (primary) hypertension: Secondary | ICD-10-CM

## 2017-01-11 DIAGNOSIS — E1140 Type 2 diabetes mellitus with diabetic neuropathy, unspecified: Secondary | ICD-10-CM

## 2017-01-11 DIAGNOSIS — E1142 Type 2 diabetes mellitus with diabetic polyneuropathy: Secondary | ICD-10-CM

## 2017-01-11 DIAGNOSIS — S91301S Unspecified open wound, right foot, sequela: Secondary | ICD-10-CM

## 2017-01-11 LAB — BAYER DCA HB A1C WAIVED: HB A1C (BAYER DCA - WAIVED): 12.3 % — ABNORMAL HIGH (ref <7.0)

## 2017-01-11 MED ORDER — HYDROCODONE-ACETAMINOPHEN 10-325 MG PO TABS: 1.0000 | ORAL_TABLET | Freq: Four times a day (QID) | ORAL | 0 refills | Status: DC | PRN

## 2017-01-11 MED ORDER — LISINOPRIL 5 MG PO TABS: 5.0000 mg | ORAL_TABLET | Freq: Every day | ORAL | 1 refills | Status: DC

## 2017-01-11 NOTE — Progress Notes (Signed)
Subjective:    Patient ID: [name], male    DOB: 05/05/1966, 51 y.o.   MRN: [id number]  HPI 51 year old gentleman who is followed for diabetes. He does not check sugars at home. Last A1c was 7.3. Lipids were assessed about one year ago and were at goal at. He is not on lipid lowering therapy. We talked about standards of care with his diabetes. I had thought he was on lisinopril but he says that he is not. We'll plan to restart lisinopril at 5 mg today. I would still hold off on the statin unless we see his LDL increasing. He continues to have some foot pain for which he takes gabapentin. He also uses hydrocodone when necessary for foot pain when he is on his feet and walking a lot at work  Patient Active Problem List   Diagnosis Date Noted  . Peripheral edema 10/02/2016  . Polycythemia 12/09/2015  . Dizziness 12/09/2015  . Type 2 diabetes mellitus ([place]) 07/22/2015  . Splinter 06/18/2014  . Osteomyelitis of toe of right foot ([place]) 06/13/2014  . Osteomyelitis ([place]) 06/12/2014  . Neuropathy in diabetes ([place]) 03/31/2014  . Charcot ankle 03/09/2014  . Essential hypertension, benign 03/03/2013  . Diabetes ([place]) 03/03/2013  . DDD (degenerative disc disease), lumbar 03/03/2013   Outpatient Encounter Prescriptions as of 01/11/2017  Medication Sig  . gabapentin (NEURONTIN) 600 MG tablet Take 1 tablet (600 mg total) by mouth at bedtime.  Marland Kitchen HYDROcodone-acetaminophen (NORCO) 10-325 MG tablet Take 1 tablet by mouth every 6 (six) hours as needed for moderate pain.  . Insulin Glargine (BASAGLAR KWIKPEN) 100 UNIT/ML SOPN INJECT 0.25-0.35 MLS (25-35 UNITS TOTAL) INTO THE SKIN AT BEDTIME.  . metFORMIN (GLUCOPHAGE) 1000 MG tablet TAKE 1 TABLET (1,000 MG TOTAL) BY MOUTH 2 (TWO) TIMES DAILY.  Marland Kitchen aspirin (ASPIRIN EC) 81 MG EC tablet Take 81 mg by mouth daily. Swallow whole.  . BD PEN NEEDLE NANO U/F 32G X 4 MM MISC USE WITH INSULIN ONCE DAILY (Patient not taking: Reported on 01/11/2017)  . colchicine 0.6  MG tablet Take 2 tablets  and 1 tab 1 hour later (Patient not taking: Reported on 01/11/2017)  . fluticasone (FLONASE) 50 MCG/ACT nasal spray Place 2 sprays into both nostrils daily. (Patient not taking: Reported on 01/11/2017)  . furosemide (LASIX) 20 MG tablet Take 1 tablet (20 mg total) by mouth daily as needed. (Patient not taking: Reported on 01/11/2017)  . glipiZIDE (GLUCOTROL) 5 MG tablet TAKE 1 TABLET (5 MG TOTAL) BY MOUTH 2 (TWO) TIMES DAILY BEFORE A MEAL. (Patient not taking: Reported on 01/11/2017)  . glucose blood (BAYER CONTOUR TEST) test strip TEST BLOOD SUGAR UP TO TWICE DAILY (Patient not taking: Reported on 01/11/2017)  . lisinopril (PRINIVIL,ZESTRIL) 20 MG tablet TAKE 1 TABLET (20 MG TOTAL) BY MOUTH DAILY. (Patient not taking: Reported on 01/11/2017)  . Multiple Vitamin (MULTIVITAMIN WITH MINERALS) TABS tablet Take 1 tablet by mouth daily.  . naproxen (NAPROSYN) 500 MG tablet Take 1 tablet (500 mg total) by mouth 2 (two) times daily with a meal. (Patient not taking: Reported on 01/11/2017)  . [DISCONTINUED] DULoxetine (CYMBALTA) 30 MG capsule Take 1 capsule (30 mg total) by mouth daily.  . [DISCONTINUED] mupirocin ointment (BACTROBAN) 2 % Apply 1 application topically 2 (two) times daily.   No facility-administered encounter medications on file as of 01/11/2017.       Review of Systems  Constitutional: Negative.   HENT: Negative.   Eyes: Negative.   Respiratory: Negative.  Negative for shortness of breath.   Cardiovascular: Negative.  Negative for chest pain and leg swelling.  Gastrointestinal: Negative.   Genitourinary: Negative.   Musculoskeletal: Negative.   Skin: Negative.   Neurological: Negative.   Psychiatric/Behavioral: Negative.   All other systems reviewed and are negative.      Objective:   Physical Exam  Constitutional: He is oriented to person, place, and time. He appears well-developed and well-nourished.  HENT:  Right Ear: External ear normal.  Left Ear: External  ear normal.  Nose: Nose normal.  Mouth/Throat: Oropharynx is clear and moist.  Eyes: Conjunctivae are normal. Pupils are equal, round, and reactive to light.  Neck: Normal range of motion.  Cardiovascular: Normal rate, regular rhythm and normal heart sounds.   Pulmonary/Chest: Effort normal and breath sounds normal.  Abdominal: Soft. He exhibits no mass. There is tenderness.  Musculoskeletal: Normal range of motion. He exhibits no edema.  Neurological: He is alert and oriented to person, place, and time. He has normal reflexes.  Skin: Skin is warm and dry.  Psychiatric: He has a normal mood and affect. His behavior is normal.   BP 125/77   Pulse 89   Temp 97.1 F (36.2 C) (Oral)   Ht 6' 4"  (1.93 m)   Wt 219 lb 9.6 oz (99.6 kg)   BMI 26.73 kg/m         Assessment & Plan:  1. Annual physical exam He sees a foot doctor regularly for issues related to his feet that seems to be doing well at present - CMP14+EGFR - Lipid panel  2. Type 2 diabetes mellitus with diabetic neuropathy, with long-term current use of insulin (HCC) Current medications include metformin and glipizide and basal insulin - Bayer DCA Hb A1c Waived  3. Wound, open, foot with complication, right, sequela Wounds have healed but he continues to have pain with excessive walking - HYDROcodone-acetaminophen (NORCO) 10-325 MG tablet; Take 1 tablet by mouth every 6 (six) hours as needed for moderate pain.  Dispense: 60 tablet; Refill: 0  4. Essential hypertension, benign Begin lisinopril 5 mg  5. Diabetic polyneuropathy associated with type 2 diabetes mellitus ([place]) Into new with gabapentin as before as well as good control of diabetes

## 2017-01-12 LAB — LIPID PANEL
CHOLESTEROL TOTAL: 125 mg/dL (ref 100–199)
Chol/HDL Ratio: 3.6 ratio units (ref 0.0–5.0)
HDL: 35 mg/dL — AB (ref >39)
LDL CALC: 65 mg/dL (ref 0–99)
Triglycerides: 124 mg/dL (ref 0–149)
VLDL Cholesterol Cal: 25 mg/dL (ref 5–40)

## 2017-01-12 LAB — CMP14+EGFR
ALBUMIN: 4.5 g/dL (ref 3.5–5.5)
ALK PHOS: 171 IU/L — AB (ref 39–117)
ALT: 22 IU/L (ref 0–44)
AST: 17 IU/L (ref 0–40)
Albumin/Globulin Ratio: 1.8 (ref 1.2–2.2)
BUN / CREAT RATIO: 19 (ref 9–20)
BUN: 18 mg/dL (ref 6–24)
Bilirubin Total: 0.9 mg/dL (ref 0.0–1.2)
CO2: 26 mmol/L (ref 18–29)
CREATININE: 0.96 mg/dL (ref 0.76–1.27)
Calcium: 9.4 mg/dL (ref 8.7–10.2)
Chloride: 98 mmol/L (ref 96–106)
GFR calc non Af Amer: 91 mL/min/{1.73_m2} (ref >59)
GFR, EST AFRICAN AMERICAN: 105 mL/min/{1.73_m2} (ref >59)
GLUCOSE: 226 mg/dL — AB (ref 65–99)
Globulin, Total: 2.5 g/dL (ref 1.5–4.5)
Potassium: 4.2 mmol/L (ref 3.5–5.2)
Sodium: 142 mmol/L (ref 134–144)
TOTAL PROTEIN: 7 g/dL (ref 6.0–8.5)

## 2017-02-07 ENCOUNTER — Other Ambulatory Visit: Payer: Self-pay | Admitting: Family Medicine

## 2017-02-08 ENCOUNTER — Emergency Department (HOSPITAL_COMMUNITY): Payer: BLUE CROSS/BLUE SHIELD

## 2017-02-08 ENCOUNTER — Encounter: Payer: Self-pay | Admitting: Pediatrics

## 2017-02-08 ENCOUNTER — Ambulatory Visit (INDEPENDENT_AMBULATORY_CARE_PROVIDER_SITE_OTHER): Payer: BLUE CROSS/BLUE SHIELD | Admitting: Pediatrics

## 2017-02-08 ENCOUNTER — Observation Stay (HOSPITAL_COMMUNITY)
Admission: EM | Admit: 2017-02-08 | Discharge: 2017-02-09 | Disposition: A | Discharge status: Home / Self Care | Payer: BLUE CROSS/BLUE SHIELD | Attending: Internal Medicine | Admitting: Internal Medicine

## 2017-02-08 ENCOUNTER — Encounter (HOSPITAL_COMMUNITY): Payer: Self-pay | Admitting: Emergency Medicine

## 2017-02-08 VITALS — BP 115/71 | HR 97 | Temp 98.1°F | Resp 20 | Ht 76.0 in | Wt 218.6 lb

## 2017-02-08 DIAGNOSIS — M14679 Charcot's joint, unspecified ankle and foot: Secondary | ICD-10-CM | POA: Diagnosis present

## 2017-02-08 DIAGNOSIS — L02612 Cutaneous abscess of left foot: Secondary | ICD-10-CM | POA: Diagnosis not present

## 2017-02-08 DIAGNOSIS — L02619 Cutaneous abscess of unspecified foot: Secondary | ICD-10-CM

## 2017-02-08 DIAGNOSIS — L03116 Cellulitis of left lower limb: Secondary | ICD-10-CM | POA: Diagnosis not present

## 2017-02-08 DIAGNOSIS — I10 Essential (primary) hypertension: Secondary | ICD-10-CM | POA: Diagnosis not present

## 2017-02-08 DIAGNOSIS — E119 Type 2 diabetes mellitus without complications: Secondary | ICD-10-CM | POA: Diagnosis not present

## 2017-02-08 DIAGNOSIS — L03119 Cellulitis of unspecified part of limb: Secondary | ICD-10-CM | POA: Diagnosis present

## 2017-02-08 DIAGNOSIS — Z794 Long term (current) use of insulin: Secondary | ICD-10-CM | POA: Insufficient documentation

## 2017-02-08 DIAGNOSIS — E1140 Type 2 diabetes mellitus with diabetic neuropathy, unspecified: Secondary | ICD-10-CM | POA: Diagnosis present

## 2017-02-08 DIAGNOSIS — M79671 Pain in right foot: Secondary | ICD-10-CM

## 2017-02-08 DIAGNOSIS — I152 Hypertension secondary to endocrine disorders: Secondary | ICD-10-CM | POA: Diagnosis present

## 2017-02-08 DIAGNOSIS — Z7982 Long term (current) use of aspirin: Secondary | ICD-10-CM | POA: Diagnosis not present

## 2017-02-08 DIAGNOSIS — Z79899 Other long term (current) drug therapy: Secondary | ICD-10-CM | POA: Diagnosis not present

## 2017-02-08 HISTORY — DX: Charcot's joint, unspecified ankle and foot: M14.679

## 2017-02-08 HISTORY — DX: Osteomyelitis, unspecified: M86.9

## 2017-02-08 LAB — BASIC METABOLIC PANEL
Anion gap: 9 (ref 5–15)
BUN: 18 mg/dL (ref 6–20)
CHLORIDE: 99 mmol/L — AB (ref 101–111)
CO2: 26 mmol/L (ref 22–32)
CREATININE: 0.96 mg/dL (ref 0.61–1.24)
Calcium: 9 mg/dL (ref 8.9–10.3)
GFR calc Af Amer: >60 mL/min (ref >60)
GFR calc non Af Amer: >60 mL/min (ref >60)
Glucose, Bld: 214 mg/dL — ABNORMAL HIGH (ref 65–99)
Potassium: 4 mmol/L (ref 3.5–5.1)
Sodium: 134 mmol/L — ABNORMAL LOW (ref 135–145)

## 2017-02-08 LAB — CBC WITH DIFFERENTIAL/PLATELET
BASOS ABS: 0 10*3/uL (ref 0.0–0.1)
Basophils Relative: 0 %
Eosinophils Absolute: 0 10*3/uL (ref 0.0–0.7)
Eosinophils Relative: 0 %
HCT: 45.3 % (ref 39.0–52.0)
Hemoglobin: 15.8 g/dL (ref 13.0–17.0)
LYMPHS PCT: 12 %
Lymphs Abs: 0.9 10*3/uL (ref 0.7–4.0)
MCH: 31.3 pg (ref 26.0–34.0)
MCHC: 34.9 g/dL (ref 30.0–36.0)
MCV: 89.9 fL (ref 78.0–100.0)
MONO ABS: 0.7 10*3/uL (ref 0.1–1.0)
MONOS PCT: 9 %
NEUTROS ABS: 6.4 10*3/uL (ref 1.7–7.7)
Neutrophils Relative %: 79 %
PLATELETS: 177 10*3/uL (ref 150–400)
RBC: 5.04 MIL/uL (ref 4.22–5.81)
RDW: 12.3 % (ref 11.5–15.5)
WBC: 8 10*3/uL (ref 4.0–10.5)

## 2017-02-08 LAB — LACTIC ACID, PLASMA
Lactic Acid, Venous: 0.9 mmol/L (ref 0.5–1.9)
Lactic Acid, Venous: 0.8 mmol/L (ref 0.5–1.9)

## 2017-02-08 LAB — GLUCOSE, CAPILLARY: Glucose-Capillary: 198 mg/dL — ABNORMAL HIGH (ref 65–99)

## 2017-02-08 MED ORDER — VANCOMYCIN HCL IN DEXTROSE 1-5 GM/200ML-% IV SOLN: 1000.0000 mg | Freq: Once | INTRAVENOUS | Status: DC

## 2017-02-08 MED ORDER — GABAPENTIN 300 MG PO CAPS
600.0000 mg | ORAL_CAPSULE | Freq: Every day | ORAL | Status: DC
  Administered 2017-02-08: 600 mg via ORAL
  Filled 2017-02-08: qty 2

## 2017-02-08 MED ORDER — POVIDONE-IODINE 10 % EX SOLN
CUTANEOUS | Status: AC
  Filled 2017-02-08: qty 118

## 2017-02-08 MED ORDER — ACETAMINOPHEN 325 MG PO TABS
650.0000 mg | ORAL_TABLET | Freq: Four times a day (QID) | ORAL | Status: DC | PRN
Start: 2017-02-08 — End: 2017-02-09

## 2017-02-08 MED ORDER — SODIUM CHLORIDE 0.9 % IV SOLN
INTRAVENOUS | Status: DC
Start: 2017-02-08 — End: 2017-02-09
  Administered 2017-02-08 – 2017-02-09 (×2): via INTRAVENOUS

## 2017-02-08 MED ORDER — ENOXAPARIN SODIUM 40 MG/0.4ML ~~LOC~~ SOLN
40.0000 mg | SUBCUTANEOUS | Status: DC
  Administered 2017-02-08: 40 mg via SUBCUTANEOUS
  Filled 2017-02-08: qty 0.4

## 2017-02-08 MED ORDER — ONDANSETRON HCL 4 MG PO TABS: 4.0000 mg | ORAL_TABLET | Freq: Four times a day (QID) | ORAL | Status: DC | PRN

## 2017-02-08 MED ORDER — INSULIN ASPART 100 UNIT/ML ~~LOC~~ SOLN: 0.0000 [IU] | Freq: Every day | SUBCUTANEOUS | Status: DC

## 2017-02-08 MED ORDER — VANCOMYCIN HCL IN DEXTROSE 1-5 GM/200ML-% IV SOLN
1000.0000 mg | Freq: Three times a day (TID) | INTRAVENOUS | Status: DC
  Administered 2017-02-09 (×2): 1000 mg via INTRAVENOUS
  Filled 2017-02-08 (×2): qty 200

## 2017-02-08 MED ORDER — GADOBENATE DIMEGLUMINE 529 MG/ML IV SOLN
20.0000 mL | Freq: Once | INTRAVENOUS | Status: AC | PRN
  Administered 2017-02-08: 20 mL via INTRAVENOUS

## 2017-02-08 MED ORDER — ONDANSETRON HCL 4 MG/2ML IJ SOLN: 4.0000 mg | Freq: Four times a day (QID) | INTRAMUSCULAR | Status: DC | PRN

## 2017-02-08 MED ORDER — INSULIN ASPART 100 UNIT/ML ~~LOC~~ SOLN
4.0000 [IU] | Freq: Three times a day (TID) | SUBCUTANEOUS | Status: DC
  Administered 2017-02-09 (×2): 4 [IU] via SUBCUTANEOUS

## 2017-02-08 MED ORDER — ASPIRIN EC 81 MG PO TBEC
81.0000 mg | DELAYED_RELEASE_TABLET | Freq: Every day | ORAL | Status: DC
  Administered 2017-02-08 – 2017-02-09 (×2): 81 mg via ORAL
  Filled 2017-02-08 (×2): qty 1

## 2017-02-08 MED ORDER — ACETAMINOPHEN 650 MG RE SUPP: 650.0000 mg | Freq: Four times a day (QID) | RECTAL | Status: DC | PRN

## 2017-02-08 MED ORDER — LISINOPRIL 5 MG PO TABS
5.0000 mg | ORAL_TABLET | Freq: Every day | ORAL | Status: DC
  Administered 2017-02-09: 5 mg via ORAL
  Filled 2017-02-08: qty 1

## 2017-02-08 MED ORDER — HYDROCODONE-ACETAMINOPHEN 10-325 MG PO TABS: 1.0000 | ORAL_TABLET | Freq: Four times a day (QID) | ORAL | Status: DC | PRN

## 2017-02-08 MED ORDER — ADULT MULTIVITAMIN W/MINERALS CH
1.0000 | ORAL_TABLET | Freq: Every day | ORAL | Status: DC
  Administered 2017-02-09: 1 via ORAL
  Filled 2017-02-08: qty 1

## 2017-02-08 MED ORDER — INSULIN GLARGINE 100 UNIT/ML ~~LOC~~ SOLN
30.0000 [IU] | Freq: Every day | SUBCUTANEOUS | Status: DC
  Administered 2017-02-08: 30 [IU] via SUBCUTANEOUS
  Filled 2017-02-08 (×4): qty 0.3

## 2017-02-08 MED ORDER — VANCOMYCIN HCL IN DEXTROSE 1-5 GM/200ML-% IV SOLN
1000.0000 mg | INTRAVENOUS | Status: AC
  Administered 2017-02-08 (×2): 1000 mg via INTRAVENOUS
  Filled 2017-02-08 (×2): qty 200

## 2017-02-08 MED ORDER — CLINDAMYCIN PHOSPHATE 600 MG/50ML IV SOLN
600.0000 mg | Freq: Once | INTRAVENOUS | Status: AC
  Administered 2017-02-08: 600 mg via INTRAVENOUS
  Filled 2017-02-08: qty 50

## 2017-02-08 MED ORDER — INSULIN ASPART 100 UNIT/ML ~~LOC~~ SOLN
0.0000 [IU] | Freq: Three times a day (TID) | SUBCUTANEOUS | Status: DC
  Administered 2017-02-09 (×2): 5 [IU] via SUBCUTANEOUS

## 2017-02-08 MED ORDER — PNEUMOCOCCAL VAC POLYVALENT 25 MCG/0.5ML IJ INJ: 0.5000 mL | INJECTION | INTRAMUSCULAR | Status: DC | PRN

## 2017-02-08 NOTE — ED Notes (Signed)
Foot rewrapped

## 2017-02-08 NOTE — ED Triage Notes (Addendum)
Patient sent here by DR Mckinney's office with doctors note to admit patient for abscess of left foot, cellulitis-IV antibiotics. Patient states abscess appeared Wednesday with chills. Patient had I&D at doctors office prior to coming to ER and had X-rays with no abnormalities or osteomyelitis.

## 2017-02-08 NOTE — Progress Notes (Signed)
  Subjective:   Patient ID: [name], male    DOB: 02/13/1966, 51 y.o.   MRN: [id number] CC: Chills; Dizziness; and no appetite  HPI: [name] is a 51 y.o. male presenting for Chills; Dizziness; and no appetite  Started having symptoms 3 days ago Has had chills off and on Feeling cold much of the time  DM2: worsening control past few months, A1c 12.3 4 weeks ago, was 7.3 four months ago   Relevant past medical, surgical, family and social history reviewed. Allergies and medications reviewed and updated. History  Smoking Status  . Never Smoker  Smokeless Tobacco  . Never Used   ROS: Per HPI   Objective:    BP 115/71   Pulse 97   Temp 98.1 F (36.7 C) (Oral)   Resp 20   Ht  (1.93 m)   Wt 218 lb 9.6 oz (99.2 kg)   SpO2 99%   BMI 26.61 kg/m   Wt Readings from Last 3 Encounters:  02/08/17 218 lb 9.6 oz (99.2 kg)  01/11/17 219 lb 9.6 oz (99.6 kg)  01/08/17 218 lb 9.6 oz (99.2 kg)    Gen: NAD, alert, cooperative with exam, NCAT EYES: EOMI, no conjunctival injection, or no icterus CV: NRRR, normal S1/S2, no murmur, distal pulses 2+ b/l Resp: CTABL, no wheezes, normal WOB Ext: No edema, warm Neuro: Alert and oriented MSK: L insole with 2cm superficial area of fluctuance visible through thin outer layer of skin, draining small amount of serous fluid from distal end. Redness, warmth over instep extending to top of foot  Assessment & Plan:   Infected diabetic foot wound with cellulitis Needs debridement, antibiotics No fever here Pt spoke with his foot/ankle doctor, will be able to see him now for debridement and further evaluation Pt wants to go there now to be seen rather than debridement here If any worsening systemic symptoms will need admission for IV abx  Follow up plan: prn Rex Kras, MD [place] [facility]

## 2017-02-08 NOTE — Progress Notes (Signed)
Pharmacy Antibiotic Note  [name] is a 51 y.o. male admitted on 02/08/2017 with cellulitis.  Pharmacy has been consulted for Vancomycin dosing.  Plan: Vancomycin  loading dose then  IV every 8 hours.  Goal trough 10-15 mcg/mL.  F/U cxs and clinical progress Monitor V/S, labs, and levels as indicated  Height:  (193 cm) Weight: 213 lb (96.6 kg) IBW/kg (Calculated) : 86.8  Temp (24hrs), Avg:98.5 F (36.9 C), Min:98.1 F (36.7 C), Max:99.2 F (37.3 C)   Recent Labs Lab 02/08/17 1230 02/08/17 1234 02/08/17 1521  WBC  --  8.0  --   CREATININE  --  0.96  --   LATICACIDVEN 0.9  --  0.8    Estimated Creatinine Clearance: 110.5 mL/min (by C-G formula based on SCr of 0.96 mg/dL).    Allergies  Allergen Reactions  . Penicillins Other (See Comments)    Has patient had a PCN reaction causing immediate rash, facial/tongue/throat swelling, SOB or lightheadedness with hypotension: No Has patient had a PCN reaction causing severe rash involving mucus membranes or skin necrosis: No Has patient had a PCN reaction that required hospitalization No Has patient had a PCN reaction occurring within the last 10 years: No If all of the above answers are "NO", then may proceed with Cephalosporin use.     Antimicrobials this admission: Vancomycin 4/5 >>  Clindamycin 4/5 x 1 dose in ED   Dose adjustments this admission: N/A  Microbiology results: 4/5 BCx: pending  Thank you for allowing pharmacy to be a part of this patient's care.  Elder Cyphers, BS Loura Back, [place] Clinical Pharmacist Pager [phone] 02/08/2017 7:41 PM

## 2017-02-08 NOTE — Consult Note (Signed)
Podiatry Consult Note  Reason for Consultation:  Cellulitis, left foot.  History of Present Illness: [name] is a 51 y.o. male started feeling bad on Tuesday.  Thought he had the flu.  Called out of work.  Saw his primary care physician earlier today.  Admission recommended.  Contacted my office for evaluation.  Debridement performed by Dr. Allena Katz earlier today.  Culture obtained.  Radiographs performed.  No erosive changes present.  Referred to ER for admission.   Past Medical History:  Diagnosis Date  . Boil    numerous boils in past  . Charcot ankle   . DDD (degenerative disc disease), lumbar   . Diabetes mellitus   . Hypertension   . Neuropathy associated with endocrine disorder (HCC)   . Osteomyelitis of toe of right foot (HCC)    Scheduled Meds: Continuous Infusions: PRN Meds:.  Allergies  Allergen Reactions  . Penicillins Other (See Comments)    Has patient had a PCN reaction causing immediate rash, facial/tongue/throat swelling, SOB or lightheadedness with hypotension: No Has patient had a PCN reaction causing severe rash involving mucus membranes or skin necrosis: No Has patient had a PCN reaction that required hospitalization No Has patient had a PCN reaction occurring within the last 10 years: No If all of the above answers are "NO", then may proceed with Cephalosporin use.    Past Surgical History:  Procedure Laterality Date  . AMPUTATION Right 06/17/2014   Procedure: PARTIAL AMPUTATION RIGHT 3RD TOE;  Surgeon: [name], DPM;  Location: [facility];  Service: Podiatry;  Laterality: Right;  . AMPUTATION Right 09/02/2014   Procedure: PARTIAL AMPUTATION 2ND TOE RIGHT FOOT;  Surgeon: [name], DPM;  Location: [facility];  Service: Podiatry;  Laterality: Right;  . AMPUTATION Right 04/01/2015   Procedure: AMPUTATION DIGIT 1ST TOE RIGHT FOOT;  Surgeon: Laurell Josephs, DPM;  Location: [facility];  Service: Podiatry;  Laterality: Right;  . AMPUTATION Right  06/29/2016   pinkeye   . HERNIA REPAIR Bilateral    and 3rd hernia repair does not remember ehat side   Family History  Problem Relation Age of Onset  . Stroke Father   . Diabetes Brother   . Stroke Brother   . Diabetes Mother    Social History:  reports that he has never smoked. He has never used smokeless tobacco. He reports that he does not drink alcohol or use drugs.  Review of Systems: CONSTITUTIONAL: Positive for fever. Positive for chills. GASTROINTESTINAL: Positive for nausea. No vomiting.  Physical Examination: Vital signs in last 24 hours:   Temp:  [98.1 F (36.7 C)-98.3 F (36.8 C)] 98.3 F (36.8 C) (04/05 1731) Pulse Rate:  [82-97] 84 (04/05 1730) Resp:  [16-20] 18 (04/05 1731) BP: (115-137)/(71-92) 120/78 (04/05 1730) SpO2:  [98 %-99 %] 98 % (04/05 1730) Weight:  [218 lb (98.9 kg)-218 lb 9.6 oz (99.2 kg)] 218 lb (98.9 kg) (04/05 1213)  Alert.  Sking of both feet is wam.  Erythema of the left foot extending to the ankle.  Full thickness wound of the plantar medial aspect of the left arch consistent with debridement earlier today.  Full thickness ulceration plantar aspect of the right first metatarsal.  Wound bed is granular.  Pedal pulses are palpable bilaterally.  Edema of the left foot.  Calves soft and nontender.  Lab/Test Results:   Recent Labs  02/08/17 1234  WBC 8.0  HGB 15.8  HCT 45.3  PLT 177  NA 134*  K  4.0  CL 99*  CO2 26  BUN 18  CREATININE 0.96  GLUCOSE 214*  CALCIUM 9.0    No results found for this or any previous visit (from the past 240 hour(s)).   [name]  Result Date: 02/08/2017 CLINICAL DATA:  Diabetic patient with an open wound on the plantar surface of the left foot at the third metatarsal head. The wound was lanced today. EXAM: MRI OF THE LEFT FOREFOOT WITHOUT AND WITH CONTRAST TECHNIQUE: Multiplanar, multisequence MR imaging of the left foot was performed both before and after administration of intravenous  contrast. CONTRAST:  20 ml MULTIHANCE GADOBENATE DIMEGLUMINE 529 MG/ML IV SOLN COMPARISON:  Plain films left foot 09/16/2017. FINDINGS: Bones/Joint/Cartilage There is no bone marrow signal abnormality to suggest osteomyelitis. Degenerative change is seen at the first tarsometatarsal joint with mild subchondral edema and cyst formation. There is some osteophytosis about the joint. No fracture dislocation. Ligaments Intact. Muscles and Tendons Intrinsic musculature of the foot appears mildly atrophied. No tear. No intramuscular fluid collection. Soft tissues No abscess is identified. Small defect in the skin on the plantar surface of the foot deep to the base of the first metatarsal may represent the site of abscess drainage. IMPRESSION: Negative for abscess, osteomyelitis or septic joint. Possible small defect in the skin on the plantar surface of the foot at the level of the base of the first metatarsal may represent the site of the patient's prior abscess. Electronically Signed   By: Drusilla Kanner M.D.   On: 02/08/2017 14:20    Assessment: 1.  Cellulitis, left foot. 2.  Wound, left foot. 3.  Ulceration, right foot. 4.  Diabetes mellitus with peripheral neuropathy.  Plan: The clinical findings, assessment and plan were explained.  Betadine dressing applied to both feet.  Change daily.  IV antibiotics per primary team.  Stay off feet.  Up to restroom and dinner table only.  Will follow during admission.  [name] 02/08/2017, 5:50 PM

## 2017-02-08 NOTE — ED Provider Notes (Signed)
[facility] Provider Note   CSN: [id number] Arrival date & time: 02/08/17  1205     History   Chief Complaint Chief Complaint  Patient presents with  . Cellulitis    HPI [name] is a 51 y.o. male.   Pt was seen at 1225. Per pt, c/o unknown onset and worsening of persistent left foot "wound" that was noticed 2 days ago. Pt states he "just developed chills" and "felt like I had a fever." Pt then noticed an abscess with surrounding redness on his left plantar foot. Pt was evaluated by his PMD and Podiatrist today, had abscess I&D in office, and was sent to the ED for further evaluation and admission for IV abx. Pt has hx of peripheral neuropathy, poor DM control and amputations of multiple toes due to osteomyelitis. Denies any other wounds/rashes, cannot recall injury, no focal motor weakness, no CP/SOB, no abd pain.   Past Medical History:  Diagnosis Date  . Boil    numerous boils in past  . Charcot ankle   . DDD (degenerative disc disease), lumbar   . Diabetes mellitus   . Hypertension   . Neuropathy associated with endocrine disorder (HCC)   . Osteomyelitis of toe of right foot [facility])     Patient Active Problem List   Diagnosis Date Noted  . Peripheral edema 10/02/2016  . Polycythemia 12/09/2015  . Dizziness 12/09/2015  . Type 2 diabetes mellitus (HCC) 07/22/2015  . Splinter 06/18/2014  . Osteomyelitis of toe of right foot (HCC) 06/13/2014  . Osteomyelitis (HCC) 06/12/2014  . Neuropathy in diabetes (HCC) 03/31/2014  . Charcot ankle 03/09/2014  . Essential hypertension, benign 03/03/2013  . Diabetes (HCC) 03/03/2013  . DDD (degenerative disc disease), lumbar 03/03/2013    Past Surgical History:  Procedure Laterality Date  . AMPUTATION Right 06/17/2014   Procedure: PARTIAL AMPUTATION RIGHT 3RD TOE;  Surgeon: [name], DPM;  Location: [facility];  Service: Podiatry;  Laterality: Right;  . AMPUTATION Right 09/02/2014   Procedure: PARTIAL  AMPUTATION 2ND TOE RIGHT FOOT;  Surgeon: [name], DPM;  Location: [facility];  Service: Podiatry;  Laterality: Right;  . AMPUTATION Right 04/01/2015   Procedure: AMPUTATION DIGIT 1ST TOE RIGHT FOOT;  Surgeon: Laurell Josephs, DPM;  Location: [facility];  Service: Podiatry;  Laterality: Right;  . AMPUTATION Right 06/29/2016   pinkeye   . HERNIA REPAIR Bilateral    and 3rd hernia repair does not remember ehat side       Home Medications    Prior to Admission medications   Medication Sig Start Date End Date Taking? Authorizing Provider  aspirin (ASPIRIN EC) 81 MG EC tablet Take 81 mg by mouth daily. Swallow whole.    Historical Provider, MD  BD PEN NEEDLE NANO U/F 32G X 4 MM MISC USE WITH INSULIN ONCE DAILY 11/30/16   Frederica Kuster, MD  colchicine 0.6 MG tablet Take 2 tablets  and 1 tab 1 hour later 07/11/16   Frederica Kuster, MD  fluticasone [facility]) 50 MCG/ACT nasal spray Place 2 sprays into both nostrils daily. 11/24/16   Frederica Kuster, MD  furosemide (LASIX) 20 MG tablet Take 1 tablet (20 mg total) by mouth daily as needed. 10/02/16   Junie Spencer, FNP  gabapentin (NEURONTIN) 600 MG tablet TAKE 1 TABLET (600 MG TOTAL) BY MOUTH AT BEDTIME. 02/07/17   Mechele Claude, MD  glipiZIDE (GLUCOTROL) 5 MG tablet TAKE 1 TABLET (5 MG TOTAL) BY MOUTH 2 (TWO)  TIMES DAILY BEFORE A MEAL. 10/23/16   Frederica Kuster, MD  glucose blood (BAYER CONTOUR TEST) test strip TEST BLOOD SUGAR UP TO TWICE DAILY 09/30/14   [name], FNP  HYDROcodone-acetaminophen [facility]) 10-325 MG tablet Take 1 tablet by mouth every 6 (six) hours as needed for moderate pain. 01/11/17   Frederica Kuster, MD  Insulin Glargine [facility] KWIKPEN) 100 UNIT/ML SOPN INJECT 0.25-0.35 MLS (25-35 UNITS TOTAL) INTO THE SKIN AT BEDTIME. 12/27/16   Frederica Kuster, MD  lisinopril (PRINIVIL,ZESTRIL) 5 MG tablet Take 1 tablet (5 mg total) by mouth daily. 01/11/17   Frederica Kuster, MD  metFORMIN (GLUCOPHAGE) 1000 MG tablet TAKE 1 TABLET  (1,000 MG TOTAL) BY MOUTH 2 (TWO) TIMES DAILY. 10/23/16   Frederica Kuster, MD  Multiple Vitamin (MULTIVITAMIN WITH MINERALS) TABS tablet Take 1 tablet by mouth daily.    Historical Provider, MD  naproxen (NAPROSYN) 500 MG tablet Take 1 tablet (500 mg total) by mouth 2 (two) times daily with a meal. 07/15/16   Mary-Margaret Daphine Deutscher, FNP    Family History Family History  Problem Relation Age of Onset  . Stroke Father   . Diabetes Brother   . Stroke Brother   . Diabetes Mother     Social History Social History  Substance Use Topics  . Smoking status: Never Smoker  . Smokeless tobacco: Never Used  . Alcohol use No     Allergies   Penicillins   Review of Systems Review of Systems ROS: Statement: All systems negative except as marked or noted in the HPI; Constitutional: +subjective fever and chills. ; ; Eyes: Negative for eye pain, redness and discharge. ; ; ENMT: Negative for ear pain, hoarseness, nasal congestion, sinus pressure and sore throat. ; ; Cardiovascular: Negative for chest pain, palpitations, diaphoresis, dyspnea and peripheral edema. ; ; Respiratory: Negative for cough, wheezing and stridor. ; ; Gastrointestinal: Negative for nausea, vomiting, diarrhea, abdominal pain, blood in stool, hematemesis, jaundice and rectal bleeding. . ; ; Genitourinary: Negative for dysuria, flank pain and hematuria. ; ; Musculoskeletal: Negative for back pain and neck pain. Negative for swelling and trauma.; ; Skin: +rash, abscess. Negative for pruritus, abrasions, blisters, bruising and skin lesion.; ; Neuro: Negative for headache, lightheadedness and neck stiffness. Negative for weakness, altered level of consciousness, altered mental status, extremity weakness, paresthesias, involuntary movement, seizure and syncope.       Physical Exam Updated Vital Signs BP (!) 134/92 (BP Location: Left Arm)   Pulse 94   Temp 98.2 F (36.8 C) (Oral)   Resp 18   Ht  (1.93 m)   Wt 218 lb (98.9 kg)    SpO2 99%   BMI 26.54 kg/m   Physical Exam 1230: Physical examination:  Nursing notes reviewed; Vital signs and O2 SAT reviewed;  Constitutional: Well developed, Well nourished, Well hydrated, In no acute distress; Head:  Normocephalic, atraumatic; Eyes: EOMI, PERRL, No scleral icterus; ENMT: Mouth and pharynx normal, Mucous membranes moist; Neck: Supple, Full range of motion, No lymphadenopathy; Cardiovascular: Regular rate and rhythm, No gallop; Respiratory: Breath sounds clear & equal bilaterally, No wheezes.  Speaking full sentences with ease, Normal respiratory effort/excursion; Chest: Nontender, Movement normal; Abdomen: Soft, Nontender, Nondistended, Normal bowel sounds; Genitourinary: No CVA tenderness; Extremities: Pulses normal, No tenderness, No edema, No calf edema or asymmetry. +left medial plantar foot with shallow open ulcer, surrounding edema, warmth, tenderness, and erythema with streaking up medial foot, ankle to lower leg.; Neuro: AA&Ox3, Major CN  grossly intact.  Speech clear. No gross focal motor or sensory deficits in extremities.; Skin: Color normal, Warm, Dry.   ED Treatments / Results  Labs (all labs ordered are listed, but only abnormal results are displayed)   EKG  EKG Interpretation None       Radiology   Procedures Procedures (including critical care time)  Medications Ordered in ED Medications  clindamycin (CLEOCIN) IVPB 600 mg (not administered)     Initial Impression / Assessment and Plan / ED Course  I have reviewed the triage vital signs and the nursing notes.  Pertinent labs & imaging results that were available during my care of the patient were reviewed by me and considered in my medical decision making (see chart for details).  MDM Reviewed: previous chart, vitals and nursing note Reviewed previous: labs and MRI Interpretation: labs and MRI    Results for orders placed or performed during the hospital encounter of 02/08/17  Basic  metabolic panel  Result Value Ref Range   Sodium 134 (L) 135 - 145 mmol/L   Potassium 4.0 3.5 - 5.1 mmol/L   Chloride 99 (L) 101 - 111 mmol/L   CO2 26 22 - 32 mmol/L   Glucose, Bld 214 (H) 65 - 99 mg/dL   BUN 18 6 - 20 mg/dL   Creatinine, Ser 1.61 0.61 - 1.24 mg/dL   Calcium 9.0 8.9 - 09.6 mg/dL   GFR calc non Af Amer >60 >60 mL/min   GFR calc Af Amer >60 >60 mL/min   Anion gap 9 5 - 15  Lactic acid, plasma  Result Value Ref Range   Lactic Acid, Venous 0.9 0.5 - 1.9 mmol/L  CBC with Differential  Result Value Ref Range   WBC 8.0 4.0 - 10.5 K/uL   RBC 5.04 4.22 - 5.81 MIL/uL   Hemoglobin 15.8 13.0 - 17.0 g/dL   HCT 04.5 40.9 - 81.1 %   MCV 89.9 78.0 - 100.0 fL   MCH 31.3 26.0 - 34.0 pg   MCHC 34.9 30.0 - 36.0 g/dL   RDW 91.4 78.2 - 95.6 %   Platelets 177 150 - 400 K/uL   Neutrophils Relative % 79 %   Neutro Abs 6.4 1.7 - 7.7 K/uL   Lymphocytes Relative 12 %   Lymphs Abs 0.9 0.7 - 4.0 K/uL   Monocytes Relative 9 %   Monocytes Absolute 0.7 0.1 - 1.0 K/uL   Eosinophils Relative 0 %   Eosinophils Absolute 0.0 0.0 - 0.7 K/uL   Basophils Relative 0 %   Basophils Absolute 0.0 0.0 - 0.1 K/uL   [name] Result Date: 02/08/2017 CLINICAL DATA:  Diabetic patient with an open wound on the plantar surface of the left foot at the third metatarsal head. The wound was lanced today. EXAM: MRI OF THE LEFT FOREFOOT WITHOUT AND WITH CONTRAST TECHNIQUE: Multiplanar, multisequence MR imaging of the left foot was performed both before and after administration of intravenous contrast. CONTRAST:  20 ml MULTIHANCE GADOBENATE DIMEGLUMINE 529 MG/ML IV SOLN COMPARISON:  Plain films left foot 09/16/2017. FINDINGS: Bones/Joint/Cartilage There is no bone marrow signal abnormality to suggest osteomyelitis. Degenerative change is seen at the first tarsometatarsal joint with mild subchondral edema and cyst formation. There is some osteophytosis about the joint. No fracture dislocation. Ligaments  Intact. Muscles and Tendons Intrinsic musculature of the foot appears mildly atrophied. No tear. No intramuscular fluid collection. Soft tissues No abscess is identified. Small defect in the skin on the  plantar surface of the foot deep to the base of the first metatarsal may represent the site of abscess drainage. IMPRESSION: Negative for abscess, osteomyelitis or septic joint. Possible small defect in the skin on the plantar surface of the foot at the level of the base of the first metatarsal may represent the site of the patient's prior abscess. Electronically Signed   By: Drusilla Kanner M.D.   On: 02/08/2017 14:20    1445:  Given pt's hx osteo with multiple amputations, as well as hx peripheral neuropathy; MRI obtained. No osteo on MRI. IV clindamycin given. Dx and testing d/w pt.  Questions answered.  Verb understanding, agreeable to admit.  T/C to [facility]. Ardyth Harps, case discussed, including:  HPI, pertinent PM/SHx, VS/PE, dx testing, ED course and treatment:  Agreeable to admit, requests to write temporary orders, obtain observational medical bed to team [facility].   Final Clinical Impressions(s) / ED Diagnoses   Final diagnoses:  Cellulitis and abscess of foot    New Prescriptions New Prescriptions   No medications on file      Samuel Jester, DO 02/11/17 0005

## 2017-02-08 NOTE — ED Notes (Signed)
Dr. Nolen Mu at bedside dressing foot.

## 2017-02-08 NOTE — H&P (Signed)
History and Physical    [name] ZOX:[id number] DOB: 1966/07/24 DOA: 02/08/2017  Referring MD/NP/PA: Samuel Jester, EDP PCP: Johna Sheriff, MD  Patient coming from: Podiatry office  Chief Complaint: Left foot edema, erythema, I and D today  HPI: [name] is a 51 y.o. male  with history of hypertension, insulin-dependent diabetes with Charcot foot who presents to the hospital today from his podiatrist's office for IV antibiotics. He states that 2 days ago he started noticing an area on the plantar aspect of his left foot that was fluctuant, he also developed subjective fevers and chills. His PCP referred him to the podiatrist today where an incision and drainage was performed, cultures were sent. The podiatrist recommended he come to the ER for admission for IV antibiotics. In the ED an MRI was performed that did not show evidence for osteomyelitis. WBC count is normal at 8.0 and there are no signs of sepsis.  Past Medical/Surgical History: Past Medical History:  Diagnosis Date  . Boil    numerous boils in past  . Charcot ankle   . DDD (degenerative disc disease), lumbar   . Diabetes mellitus   . Hypertension   . Neuropathy associated with endocrine disorder (HCC)   . Osteomyelitis of toe of right foot [facility])     Past Surgical History:  Procedure Laterality Date  . AMPUTATION Right 06/17/2014   Procedure: PARTIAL AMPUTATION RIGHT 3RD TOE;  Surgeon: [name], DPM;  Location: [facility];  Service: Podiatry;  Laterality: Right;  . AMPUTATION Right 09/02/2014   Procedure: PARTIAL AMPUTATION 2ND TOE RIGHT FOOT;  Surgeon: [name], DPM;  Location: [facility];  Service: Podiatry;  Laterality: Right;  . AMPUTATION Right 04/01/2015   Procedure: AMPUTATION DIGIT 1ST TOE RIGHT FOOT;  Surgeon: Laurell Josephs, DPM;  Location: [facility];  Service: Podiatry;  Laterality: Right;  . AMPUTATION Right 06/29/2016   pinkeye   . HERNIA REPAIR Bilateral    and 3rd hernia repair  does not remember ehat side    Social History:  reports that he has never smoked. He has never used smokeless tobacco. He reports that he does not drink alcohol or use drugs.  Allergies: Allergies  Allergen Reactions  . Penicillins Other (See Comments)    Has patient had a PCN reaction causing immediate rash, facial/tongue/throat swelling, SOB or lightheadedness with hypotension: No Has patient had a PCN reaction causing severe rash involving mucus membranes or skin necrosis: No Has patient had a PCN reaction that required hospitalization No Has patient had a PCN reaction occurring within the last 10 years: No If all of the above answers are "NO", then may proceed with Cephalosporin use.     Family History:  Family History  Problem Relation Age of Onset  . Stroke Father   . Diabetes Brother   . Stroke Brother   . Diabetes Mother     Prior to Admission medications   Medication Sig Start Date End Date Taking? Authorizing Provider  aspirin (ASPIRIN EC) 81 MG EC tablet Take 81 mg by mouth daily. Swallow whole.   Yes Historical Provider, MD  BD PEN NEEDLE NANO U/F 32G X 4 MM MISC USE WITH INSULIN ONCE DAILY 11/30/16  Yes Frederica Kuster, MD  fluticasone [facility]) 50 MCG/ACT nasal spray Place 2 sprays into both nostrils daily. 11/24/16  Yes Frederica Kuster, MD  gabapentin (NEURONTIN) 600 MG tablet TAKE 1 TABLET (600 MG TOTAL) BY MOUTH AT BEDTIME. 02/07/17  Yes  Mechele Claude, MD  glucose blood (BAYER CONTOUR TEST) test strip TEST BLOOD SUGAR UP TO TWICE DAILY 09/30/14  Yes [name], FNP  HYDROcodone-acetaminophen (NORCO) 10-325 MG tablet Take 1 tablet by mouth every 6 (six) hours as needed for moderate pain. 01/11/17  Yes Frederica Kuster, MD  Insulin Glargine [facility] KWIKPEN) 100 UNIT/ML SOPN INJECT 0.25-0.35 MLS (25-35 UNITS TOTAL) INTO THE SKIN AT BEDTIME. 12/27/16  Yes Frederica Kuster, MD  lisinopril (PRINIVIL,ZESTRIL) 5 MG tablet Take 1 tablet (5 mg total) by mouth daily.  01/11/17  Yes Frederica Kuster, MD  metFORMIN (GLUCOPHAGE) 1000 MG tablet TAKE 1 TABLET (1,000 MG TOTAL) BY MOUTH 2 (TWO) TIMES DAILY. 10/23/16  Yes Frederica Kuster, MD  Multiple Vitamin (MULTIVITAMIN WITH MINERALS) TABS tablet Take 1 tablet by mouth daily.   Yes Historical Provider, MD  colchicine 0.6 MG tablet Take 2 tablets  and 1 tab 1 hour later Patient not taking: Reported on 02/08/2017 07/11/16   Frederica Kuster, MD  glipiZIDE (GLUCOTROL) 5 MG tablet TAKE 1 TABLET (5 MG TOTAL) BY MOUTH 2 (TWO) TIMES DAILY BEFORE A MEAL. Patient not taking: Reported on 02/08/2017 10/23/16   Frederica Kuster, MD    Review of Systems:  Constitutional: Denies diaphoresis, appetite change and fatigue.  HEENT: Denies photophobia, eye pain, redness, hearing loss, ear pain, congestion, sore throat, rhinorrhea, sneezing, mouth sores, trouble swallowing, neck pain, neck stiffness and tinnitus.   Respiratory: Denies SOB, DOE, cough, chest tightness,  and wheezing.   Cardiovascular: Denies chest pain, palpitations and leg swelling.  Gastrointestinal: Denies nausea, vomiting, abdominal pain, diarrhea, constipation, blood in stool and abdominal distention.  Genitourinary: Denies dysuria, urgency, frequency, hematuria, flank pain and difficulty urinating.  Endocrine: Denies: hot or cold intolerance, sweats, changes in hair or nails, polyuria, polydipsia. Musculoskeletal: Denies myalgias, back pain, joint swelling, arthralgias and gait problem.  Skin: Denies pallor, rash and wound.  Neurological: Denies dizziness, seizures, syncope, weakness, light-headedness, numbness and headaches.  Hematological: Denies adenopathy. Easy bruising, personal or family bleeding history  Psychiatric/Behavioral: Denies suicidal ideation, mood changes, confusion, nervousness, sleep disturbance and agitation    Physical Exam: Vitals:   02/08/17 1630 02/08/17 1700 02/08/17 1730 02/08/17 1731  BP: 125/74 137/82 120/78   Pulse: 82 84 84     Resp:    18  Temp:    98.3 F (36.8 C)  TempSrc:    Oral  SpO2: 98% 98% 98%   Weight:      Height:         Constitutional: NAD, calm, comfortable Eyes: PERRL, lids and conjunctivae normal ENMT: Mucous membranes are moist. Posterior pharynx clear of any exudate or lesions.Normal dentition.  Neck: normal, supple, no masses, no thyromegaly Respiratory: clear to auscultation bilaterally, no wheezing, no crackles. Normal respiratory effort. No accessory muscle use.  Cardiovascular: Regular rate and rhythm, no murmurs / rubs / gallops. No extremity edema. 2+ pedal pulses. No carotid bruits.  Abdomen: no tenderness, no masses palpated. No hepatosplenomegaly. Bowel sounds positive.  Musculoskeletal: no clubbing / cyanosis. Bilateral Charcot foot deformation. Left foot with wound on the plantar aspect of the first metatarsal with streaky erythema around his ankle extending up the inner aspect of his calf.  Skin: no rashes, lesions, ulcers. No induration Neurologic: CN 2-12 grossly intact. Sensation intact, DTR normal. Strength 5/5 in all 4.  Psychiatric: Normal judgment and insight. Alert and oriented x 3. Normal mood.    Labs on Admission: I have personally reviewed  the following labs and imaging studies  CBC:  Recent Labs Lab 02/08/17 1234  WBC 8.0  NEUTROABS 6.4  HGB 15.8  HCT 45.3  MCV 89.9  PLT 177   Basic Metabolic Panel:  Recent Labs Lab 02/08/17 1234  NA 134*  K 4.0  CL 99*  CO2 26  GLUCOSE 214*  BUN 18  CREATININE 0.96  CALCIUM 9.0   GFR: Estimated Creatinine Clearance: 110.5 mL/min (by C-G formula based on SCr of 0.96 mg/dL). Liver Function Tests: No results for input(s): AST, ALT, ALKPHOS, BILITOT, PROT, ALBUMIN in the last 168 hours. No results for input(s): LIPASE, AMYLASE in the last 168 hours. No results for input(s): AMMONIA in the last 168 hours. Coagulation Profile: No results for input(s): INR, PROTIME in the last 168 hours. Cardiac Enzymes: No  results for input(s): CKTOTAL, CKMB, CKMBINDEX, TROPONINI in the last 168 hours. BNP (last 3 results) No results for input(s): PROBNP in the last 8760 hours. HbA1C: No results for input(s): HGBA1C in the last 72 hours. CBG: No results for input(s): GLUCAP in the last 168 hours. Lipid Profile: No results for input(s): CHOL, HDL, LDLCALC, TRIG, CHOLHDL, LDLDIRECT in the last 72 hours. Thyroid Function Tests: No results for input(s): TSH, T4TOTAL, FREET4, T3FREE, THYROIDAB in the last 72 hours. Anemia Panel: No results for input(s): VITAMINB12, FOLATE, FERRITIN, TIBC, IRON, RETICCTPCT in the last 72 hours. Urine analysis:    Component Value Date/Time   COLORURINE YELLOW 06/13/2014 1048   APPEARANCEUR CLEAR 06/13/2014 1048   LABSPEC 1.020 06/13/2014 1048   PHURINE 6.0 06/13/2014 1048   GLUCOSEU >1000 (A) 06/13/2014 1048   HGBUR NEGATIVE 06/13/2014 1048   BILIRUBINUR NEGATIVE 06/13/2014 1048   BILIRUBINUR negative 03/26/2014 1212   KETONESUR NEGATIVE 06/13/2014 1048   PROTEINUR NEGATIVE 06/13/2014 1048   UROBILINOGEN 0.2 06/13/2014 1048   NITRITE NEGATIVE 06/13/2014 1048   LEUKOCYTESUR NEGATIVE 06/13/2014 1048   Sepsis Labs: (procalcitonin:4,lacticidven:4) )No results found for this or any previous visit (from the past 240 hour(s)).   Radiological Exams on Admission: [name]  Result Date: 02/08/2017 CLINICAL DATA:  Diabetic patient with an open wound on the plantar surface of the left foot at the third metatarsal head. The wound was lanced today. EXAM: MRI OF THE LEFT FOREFOOT WITHOUT AND WITH CONTRAST TECHNIQUE: Multiplanar, multisequence MR imaging of the left foot was performed both before and after administration of intravenous contrast. CONTRAST:  20 ml MULTIHANCE GADOBENATE DIMEGLUMINE 529 MG/ML IV SOLN COMPARISON:  Plain films left foot 09/16/2017. FINDINGS: Bones/Joint/Cartilage There is no bone marrow signal abnormality to suggest osteomyelitis.  Degenerative change is seen at the first tarsometatarsal joint with mild subchondral edema and cyst formation. There is some osteophytosis about the joint. No fracture dislocation. Ligaments Intact. Muscles and Tendons Intrinsic musculature of the foot appears mildly atrophied. No tear. No intramuscular fluid collection. Soft tissues No abscess is identified. Small defect in the skin on the plantar surface of the foot deep to the base of the first metatarsal may represent the site of abscess drainage. IMPRESSION: Negative for abscess, osteomyelitis or septic joint. Possible small defect in the skin on the plantar surface of the foot at the level of the base of the first metatarsal may represent the site of the patient's prior abscess. Electronically Signed   By: Drusilla Kanner M.D.   On: 02/08/2017 14:20    EKG: Independently reviewed. None obtained in the ED  Assessment/Plan Principal Problem:   Cellulitis and abscess  of foot Active Problems:   Essential hypertension, benign   Diabetes (HCC)   Charcot ankle   Neuropathy in diabetes (HCC)   Cellulitis and abscess of left foot -Status post incision and drainage at the podiatrist's office, cultures have been sent and are pending. -Start IV vancomycin, suspect will remain on IV antibiotics for 24-48 hours prior to transitioning to oral antibiotics.  -I did speak with Dr. Nolen Mu, podiatrist, in the ED who is currently dressing the wound and will follow patient while in the hospital.  Diabetes -Check A1c. -Continue Lantus, start sliding scale insulin. -Hold metformin while in the hospital.  Diabetic peripheral neuropathy -Continue Neurontin.  Hypertension -Well-controlled, continue home medications.   DVT prophylaxis: Lovenox  Code Status: Full code  Family Communication: Patient only  Disposition Plan: Anticipate discharge home in 24-48 hours  Consults called: Podiatry  Admission status: Observation    Time Spent: 75 minutes    Chaya Jan MD [facility] Pager [phone]  If 7PM-7AM, please contact night-coverage [email or web address] Password [facility]  02/08/2017, 6:12 PM

## 2017-02-08 NOTE — ED Notes (Signed)
Pt given sprite zero 

## 2017-02-09 DIAGNOSIS — L03119 Cellulitis of unspecified part of limb: Secondary | ICD-10-CM | POA: Diagnosis not present

## 2017-02-09 DIAGNOSIS — L02619 Cutaneous abscess of unspecified foot: Secondary | ICD-10-CM | POA: Diagnosis not present

## 2017-02-09 LAB — BASIC METABOLIC PANEL
ANION GAP: 7 (ref 5–15)
BUN: 13 mg/dL (ref 6–20)
CALCIUM: 8.6 mg/dL — AB (ref 8.9–10.3)
CO2: 28 mmol/L (ref 22–32)
CREATININE: 0.9 mg/dL (ref 0.61–1.24)
Chloride: 98 mmol/L — ABNORMAL LOW (ref 101–111)
Glucose, Bld: 268 mg/dL — ABNORMAL HIGH (ref 65–99)
Potassium: 4.1 mmol/L (ref 3.5–5.1)
SODIUM: 133 mmol/L — AB (ref 135–145)

## 2017-02-09 LAB — CBC
HCT: 42.2 % (ref 39.0–52.0)
HEMOGLOBIN: 14.7 g/dL (ref 13.0–17.0)
MCH: 31.5 pg (ref 26.0–34.0)
MCHC: 34.8 g/dL (ref 30.0–36.0)
MCV: 90.4 fL (ref 78.0–100.0)
PLATELETS: 175 10*3/uL (ref 150–400)
RBC: 4.67 MIL/uL (ref 4.22–5.81)
RDW: 12.3 % (ref 11.5–15.5)
WBC: 5.2 10*3/uL (ref 4.0–10.5)

## 2017-02-09 LAB — GLUCOSE, CAPILLARY
GLUCOSE-CAPILLARY: 220 mg/dL — AB (ref 65–99)
Glucose-Capillary: 226 mg/dL — ABNORMAL HIGH (ref 65–99)

## 2017-02-09 MED ORDER — DOXYCYCLINE HYCLATE 100 MG PO TABS: 100.0000 mg | ORAL_TABLET | Freq: Two times a day (BID) | ORAL | 0 refills | Status: DC

## 2017-02-09 NOTE — Care Management Note (Signed)
Case Management Note  Patient Details  Name: [name] MRN: [id number] Date of Birth: 1966/07/21  Subjective/Objective:                  Pt admitted with wound to his foot. He is from home, ind with ADL's. He has PCP, transportation to appointments. He has insurance with drug coverage. He states podiatrist does his wound care. No HH pta. No needs communicated.   Action/Plan: Discharging home today with self care.   Expected Discharge Date:  02/09/17               Expected Discharge Plan:  Home/Self Care  In-House Referral:  NA  Discharge planning Services  NA  Post Acute Care Choice:  NA Choice offered to:  NA  Status of Service:  Completed, signed off  Malcolm Metro, RN 02/09/2017, 2:07 PM

## 2017-02-09 NOTE — Progress Notes (Signed)
IV removed, WNL. D/C instructions and prescription given to pt. Verbalized understanding. Pt is transporting himself home.

## 2017-02-09 NOTE — Discharge Summary (Signed)
Physician Discharge Summary  [name] ZOX:[id number] DOB: [birth year] DOA: 02/08/2017  PCP: Johna Sheriff, MD  Admit date: 02/08/2017 Discharge date: 02/09/2017  Time spent: 45 minutes  Recommendations for Outpatient Follow-up:  -Will be discharged home today. -Advised to follow up with podiatrist in 2 weeks.   Discharge Diagnoses:  Principal Problem:   Cellulitis and abscess of foot Active Problems:   Essential hypertension, benign   Diabetes (HCC)   Charcot ankle   Neuropathy in diabetes [facility])   Discharge Condition: Stable and improved  Filed Weights   02/08/17 1213 02/08/17 1838  Weight: 98.9 kg (218 lb) 96.6 kg (213 lb)    History of present illness:  [name] is a 51 y.o. male  with history of hypertension, insulin-dependent diabetes with Charcot foot who presents to the hospital today from his podiatrist's office for IV antibiotics. He states that 2 days ago he started noticing an area on the plantar aspect of his left foot that was fluctuant, he also developed subjective fevers and chills. His PCP referred him to the podiatrist today where an incision and drainage was performed, cultures were sent. The podiatrist recommended he come to the ER for admission for IV antibiotics. In the ED an MRI was performed that did not show evidence for osteomyelitis. WBC count is normal at 8.0 and there are no signs of sepsis.  Hospital Course:   Cellulitis and Abscess of Left Foot -Status post incision and drainage at the podiatrist's office, cultures have been sent and are pending. -Per Dr. Nolen Mu, ok to DC home today. -Will send on a 14 day course of doxycycline.  Rest of chronic conditions have been stable.  Procedures:  None   Consultations:  podiatry  Discharge Instructions  Discharge Instructions    Diet - low sodium heart healthy    Complete by:  As directed    Increase activity slowly    Complete by:  As directed      Allergies as of 02/09/2017        Reactions   Penicillins Other (See Comments)   Has patient had a PCN reaction causing immediate rash, facial/tongue/throat swelling, SOB or lightheadedness with hypotension: No Has patient had a PCN reaction causing severe rash involving mucus membranes or skin necrosis: No Has patient had a PCN reaction that required hospitalization No Has patient had a PCN reaction occurring within the last 10 years: No If all of the above answers are "NO", then may proceed with Cephalosporin use.      Medication List    STOP taking these medications   glipiZIDE 5 MG tablet Commonly known as:  GLUCOTROL     TAKE these medications   aspirin EC 81 MG EC tablet Generic drug:  aspirin Take 81 mg by mouth daily. Swallow whole.   BASAGLAR KWIKPEN 100 UNIT/ML Sopn INJECT 0.25-0.35 MLS (25-35 UNITS TOTAL) INTO THE SKIN AT BEDTIME.   BD PEN NEEDLE NANO U/F 32G X 4 MM Misc Generic drug:  Insulin Pen Needle USE WITH INSULIN ONCE DAILY   colchicine 0.6 MG tablet Take 2 tablets  and 1 tab 1 hour later   doxycycline 100 MG tablet Commonly known as:  VIBRA-TABS Take 1 tablet (100 mg total) by mouth 2 (two) times daily.   fluticasone 50 MCG/ACT nasal spray Commonly known as:  FLONASE Place 2 sprays into both nostrils daily.   gabapentin 600 MG tablet Commonly known as:  NEURONTIN TAKE 1 TABLET (600 MG TOTAL)  BY MOUTH AT BEDTIME.   glucose blood test strip Commonly known as:  BAYER CONTOUR TEST TEST BLOOD SUGAR UP TO TWICE DAILY   HYDROcodone-acetaminophen 10-325 MG tablet Commonly known as:  NORCO Take 1 tablet by mouth every 6 (six) hours as needed for moderate pain.   lisinopril 5 MG tablet Commonly known as:  PRINIVIL,ZESTRIL Take 1 tablet (5 mg total) by mouth daily.   metFORMIN 1000 MG tablet Commonly known as:  GLUCOPHAGE TAKE 1 TABLET (1,000 MG TOTAL) BY MOUTH 2 (TWO) TIMES DAILY.   multivitamin with minerals Tabs tablet Take 1 tablet by mouth daily.      Allergies   Allergen Reactions  . Penicillins Other (See Comments)    Has patient had a PCN reaction causing immediate rash, facial/tongue/throat swelling, SOB or lightheadedness with hypotension: No Has patient had a PCN reaction causing severe rash involving mucus membranes or skin necrosis: No Has patient had a PCN reaction that required hospitalization No Has patient had a PCN reaction occurring within the last 10 years: No If all of the above answers are "NO", then may proceed with Cephalosporin use.    Follow-up Information    [name], DPM. Schedule an appointment as soon as possible for a visit on 02/27/2017.   Specialty:  Podiatry Why:  10:00 am Contact information: [address] [place] [address] [phone]            The results of significant diagnostics from this hospitalization (including imaging, microbiology, ancillary and laboratory) are listed below for reference.    Significant Diagnostic Studies: [name]  Result Date: 02/08/2017 CLINICAL DATA:  Diabetic patient with an open wound on the plantar surface of the left foot at the third metatarsal head. The wound was lanced today. EXAM: MRI OF THE LEFT FOREFOOT WITHOUT AND WITH CONTRAST TECHNIQUE: Multiplanar, multisequence MR imaging of the left foot was performed both before and after administration of intravenous contrast. CONTRAST:  20 ml MULTIHANCE GADOBENATE DIMEGLUMINE 529 MG/ML IV SOLN COMPARISON:  Plain films left foot 09/16/2017. FINDINGS: Bones/Joint/Cartilage There is no bone marrow signal abnormality to suggest osteomyelitis. Degenerative change is seen at the first tarsometatarsal joint with mild subchondral edema and cyst formation. There is some osteophytosis about the joint. No fracture dislocation. Ligaments Intact. Muscles and Tendons Intrinsic musculature of the foot appears mildly atrophied. No tear. No intramuscular fluid collection. Soft tissues No abscess is identified.  Small defect in the skin on the plantar surface of the foot deep to the base of the first metatarsal may represent the site of abscess drainage. IMPRESSION: Negative for abscess, osteomyelitis or septic joint. Possible small defect in the skin on the plantar surface of the foot at the level of the base of the first metatarsal may represent the site of the patient's prior abscess. Electronically Signed   By: Drusilla Kanner M.D.   On: 02/08/2017 14:20    Microbiology: Recent Results (from the past 240 hour(s))  Culture, blood (routine x 2)     Status: None (Preliminary result)   Collection Time: 02/08/17  7:55 PM  Result Value Ref Range Status   Specimen Description RIGHT ANTECUBITAL  Final   Special Requests   Final    BOTTLES DRAWN AEROBIC AND ANAEROBIC Blood Culture adequate volume   Culture NO GROWTH < 12 HOURS  Final   Report Status PENDING  Incomplete  Culture, blood (routine x 2)     Status: None (Preliminary result)  Collection Time: 02/08/17  7:55 PM  Result Value Ref Range Status   Specimen Description LEFT ANTECUBITAL  Final   Special Requests   Final    BOTTLES DRAWN AEROBIC AND ANAEROBIC Blood Culture adequate volume   Culture NO GROWTH < 12 HOURS  Final   Report Status PENDING  Incomplete     Labs: Basic Metabolic Panel:  Recent Labs Lab 02/08/17 1234 02/09/17 0555  NA 134* 133*  K 4.0 4.1  CL 99* 98*  CO2 26 28  GLUCOSE 214* 268*  BUN 18 13  CREATININE 0.96 0.90  CALCIUM 9.0 8.6*   Liver Function Tests: No results for input(s): AST, ALT, ALKPHOS, BILITOT, PROT, ALBUMIN in the last 168 hours. No results for input(s): LIPASE, AMYLASE in the last 168 hours. No results for input(s): AMMONIA in the last 168 hours. CBC:  Recent Labs Lab 02/08/17 1234 02/09/17 0555  WBC 8.0 5.2  NEUTROABS 6.4  --   HGB 15.8 14.7  HCT 45.3 42.2  MCV 89.9 90.4  PLT 177 175   Cardiac Enzymes: No results for input(s): CKTOTAL, CKMB, CKMBINDEX, TROPONINI in the last 168  hours. BNP: BNP (last 3 results) No results for input(s): BNP in the last 8760 hours.  ProBNP (last 3 results) No results for input(s): PROBNP in the last 8760 hours.  CBG:  Recent Labs Lab 02/08/17 2144 02/09/17 0744 02/09/17 1116  GLUCAP 198* 226* 220*       Signed:  Chaya Jan  [facility] Pager: [phone] 02/09/2017, 5:10 PM

## 2017-02-09 NOTE — Progress Notes (Signed)
Podiatry Progress Note  Subjective [name] was admitted yesterday with cellulitis of his left lower extremity.  He Is currently receiving vancomycin.  He denies any nausea, vomiting, fever or chills.  Objective Vital signs in last 24 hours:   Temp:  [98.1 F (36.7 C)-99.2 F (37.3 C)] 98.7 F (37.1 C) (04/06 0413) Pulse Rate:  [82-86] 83 (04/06 0413) Resp:  [18] 18 (04/06 0413) BP: (106-137)/(56-86) 106/56 (04/06 0413) SpO2:  [97 %-99 %] 98 % (04/06 0413) Weight:  [213 lb (96.6 kg)] 213 lb (96.6 kg) (04/05 1838)  A dressing is clean, dry and intact on both feet.  There is a full-thickness ulceration present along the plantar aspect of the right first metatarsal head.  The wound bed is granular with no acute signs of infection present.  There is a full-thickness wound present on the plantar aspect of the left midfoot.  There is periwound erythema present.  The erythema has receded compared to yesterday.  Dorsalis pedis pulse is palpable bilaterally.  Posterior tibial pulse is palpable bilaterally.  There is edema of the left foot.  Calves are soft and nontender.  There is decrease in the medial longitudinal arch of both feet.  Lab/Test Results  Recent Labs  02/08/17 1234 02/09/17 0555  WBC 8.0 5.2  HGB 15.8 14.7  HCT 45.3 42.2  PLT 177 175  NA 134* 133*  K 4.0 4.1  CL 99* 98*  CO2 26 28  BUN 18 13  CREATININE 0.96 0.90  GLUCOSE 214* 268*  CALCIUM 9.0 8.6*    Recent Results (from the past 240 hour(s))  Culture, blood (routine x 2)     Status: None (Preliminary result)   Collection Time: 02/08/17  7:55 PM  Result Value Ref Range Status   Specimen Description RIGHT ANTECUBITAL  Final   Special Requests   Final    BOTTLES DRAWN AEROBIC AND ANAEROBIC Blood Culture adequate volume   Culture NO GROWTH < 12 HOURS  Final   Report Status PENDING  Incomplete  Culture, blood (routine x 2)     Status: None (Preliminary result)   Collection Time: 02/08/17  7:55 PM  Result  Value Ref Range Status   Specimen Description LEFT ANTECUBITAL  Final   Special Requests   Final    BOTTLES DRAWN AEROBIC AND ANAEROBIC Blood Culture adequate volume   Culture NO GROWTH < 12 HOURS  Final   Report Status PENDING  Incomplete     [name]  Result Date: 02/08/2017 CLINICAL DATA:  Diabetic patient with an open wound on the plantar surface of the left foot at the third metatarsal head. The wound was lanced today. EXAM: MRI OF THE LEFT FOREFOOT WITHOUT AND WITH CONTRAST TECHNIQUE: Multiplanar, multisequence MR imaging of the left foot was performed both before and after administration of intravenous contrast. CONTRAST:  20 ml MULTIHANCE GADOBENATE DIMEGLUMINE 529 MG/ML IV SOLN COMPARISON:  Plain films left foot 09/16/2017. FINDINGS: Bones/Joint/Cartilage There is no bone marrow signal abnormality to suggest osteomyelitis. Degenerative change is seen at the first tarsometatarsal joint with mild subchondral edema and cyst formation. There is some osteophytosis about the joint. No fracture dislocation. Ligaments Intact. Muscles and Tendons Intrinsic musculature of the foot appears mildly atrophied. No tear. No intramuscular fluid collection. Soft tissues No abscess is identified. Small defect in the skin on the plantar surface of the foot deep to the base of the first metatarsal may represent the site of abscess drainage. IMPRESSION:  Negative for abscess, osteomyelitis or septic joint. Possible small defect in the skin on the plantar surface of the foot at the level of the base of the first metatarsal may represent the site of the patient's prior abscess. Electronically Signed   By: [name] M.D.   On: 02/08/2017 14:20    Medications Scheduled Meds: . aspirin EC  81 mg Oral Daily  . enoxaparin (LOVENOX) injection  40 mg Subcutaneous Q24H  . gabapentin  600 mg Oral QHS  . insulin aspart  0-15 Units Subcutaneous TID WC  . insulin aspart  0-5 Units Subcutaneous QHS  .  insulin aspart  4 Units Subcutaneous TID WC  . insulin glargine  30 Units Subcutaneous QHS  . lisinopril  5 mg Oral Daily  . multivitamin with minerals  1 tablet Oral Daily  . vancomycin  1,000 mg Intravenous Q8H   Continuous Infusions: . sodium chloride 75 mL/hr at 02/09/17 1103   PRN Meds:.acetaminophen **OR** acetaminophen, HYDROcodone-acetaminophen, ondansetron **OR** ondansetron (ZOFRAN) IV, pneumococcal 23 valent vaccine  Assessment 1.  Cellulitis, left lower extremity. 2.  Diabetic ulceration, right foot. 3.  Diabetes mellitus with peripheral neuropathy.  Plan A Betadine dressing was applied to both feet.  He was instructed to change dressing daily.  From my standpoint, he is okay to go home with oral antibiotics for 10 days.  I recommend doxycycline 100 mg twice a day.  He is to follow-up with me on Tuesday in my private office or sooner if problems arise.  He may not return to work.  [name] 02/09/2017, 4:32 PM

## 2017-02-09 NOTE — Progress Notes (Signed)
Inpatient Diabetes Program Recommendations  AACE/ADA: New Consensus Statement on Inpatient Glycemic Control (2015)  Target Ranges:  Prepandial:   less than 140 mg/dL      Peak postprandial:   less than 180 mg/dL (1-2 hours)      Critically ill patients:  140 - 180 mg/dL  Results for JASKIRAT, SCHWIEGER (MRN [id number]) as of 02/09/2017 13:12  Ref. Range 02/08/2017 21:44 02/09/2017 07:44 02/09/2017 11:16  Glucose-Capillary Latest Ref Range: 65 - 99 mg/dL 409 (H) 811 (H) 914 (H)    Review of Glycemic Control  Diabetes history: DM2 Outpatient Diabetes medications: Basaglar 25-35 units QHS, Metformin 1000 mg BID, Glipizide 5 mg BID (not taking Glipizide per home medication list) Current orders for Inpatient glycemic control: Lantus 30 units QHS, Novolog 0-15 units TID with meals, Novolog 0-5 units QHS, Novolog 4 units TID with meals  Inpatient Diabetes Program Recommendations: Insulin - Basal: Please consider increasing Lantus to 35 units QHS. Insulin - Meal Coverage: Please consider increasing meal coverage to Novolog 6 units TID with meals.  Thanks, Orlando Penner, RN, MSN, CDE Diabetes Coordinator Inpatient Diabetes Program [phone] (Team Pager from 8am to 5pm)

## 2017-02-10 LAB — HIV ANTIBODY (ROUTINE TESTING W REFLEX): HIV SCREEN 4TH GENERATION: NONREACTIVE

## 2017-02-10 LAB — HEMOGLOBIN A1C
HEMOGLOBIN A1C: 10.8 % — AB (ref 4.8–5.6)
Mean Plasma Glucose: 263 mg/dL

## 2017-02-13 LAB — CULTURE, BLOOD (ROUTINE X 2)
CULTURE: NO GROWTH
Culture: NO GROWTH
SPECIAL REQUESTS: ADEQUATE
Special Requests: ADEQUATE

## 2017-03-06 HISTORY — PX: FOOT SURGERY: SHX648

## 2017-03-13 ENCOUNTER — Telehealth: Payer: Self-pay | Admitting: *Deleted

## 2017-03-13 MED ORDER — BASAGLAR KWIKPEN 100 UNIT/ML ~~LOC~~ SOPN: 25.0000 [IU] | PEN_INJECTOR | Freq: Every day | SUBCUTANEOUS | 3 refills | Status: DC

## 2017-03-13 MED ORDER — INSULIN PEN NEEDLE 32G X 4 MM MISC: 3 refills | Status: DC

## 2017-03-13 NOTE — Telephone Encounter (Signed)
done

## 2017-04-04 ENCOUNTER — Other Ambulatory Visit: Payer: Self-pay | Admitting: *Deleted

## 2017-04-04 MED ORDER — METFORMIN HCL 1000 MG PO TABS: 1000.0000 mg | ORAL_TABLET | Freq: Two times a day (BID) | ORAL | 0 refills | Status: DC

## 2017-04-04 MED ORDER — BASAGLAR KWIKPEN 100 UNIT/ML ~~LOC~~ SOPN: 25.0000 [IU] | PEN_INJECTOR | Freq: Every day | SUBCUTANEOUS | 1 refills | Status: DC

## 2017-04-04 MED ORDER — LISINOPRIL 5 MG PO TABS: 5.0000 mg | ORAL_TABLET | Freq: Every day | ORAL | 0 refills | Status: DC

## 2017-04-04 MED ORDER — GABAPENTIN 600 MG PO TABS: 600.0000 mg | ORAL_TABLET | Freq: Every day | ORAL | 1 refills | Status: DC

## 2017-04-04 MED ORDER — INSULIN PEN NEEDLE 32G X 4 MM MISC: 3 refills | Status: DC

## 2017-04-11 ENCOUNTER — Encounter (HOSPITAL_COMMUNITY): Payer: Self-pay | Admitting: Emergency Medicine

## 2017-04-11 ENCOUNTER — Emergency Department (HOSPITAL_COMMUNITY): Payer: BLUE CROSS/BLUE SHIELD

## 2017-04-11 ENCOUNTER — Inpatient Hospital Stay (HOSPITAL_COMMUNITY)
Admission: EM | Admit: 2017-04-11 | Discharge: 2017-04-13 | DRG: 638 | Disposition: A | Discharge status: Home / Self Care | Payer: BLUE CROSS/BLUE SHIELD | Attending: Internal Medicine | Admitting: Internal Medicine

## 2017-04-11 DIAGNOSIS — Z833 Family history of diabetes mellitus: Secondary | ICD-10-CM | POA: Diagnosis not present

## 2017-04-11 DIAGNOSIS — M14679 Charcot's joint, unspecified ankle and foot: Secondary | ICD-10-CM | POA: Diagnosis present

## 2017-04-11 DIAGNOSIS — L97522 Non-pressure chronic ulcer of other part of left foot with fat layer exposed: Secondary | ICD-10-CM

## 2017-04-11 DIAGNOSIS — L03119 Cellulitis of unspecified part of limb: Secondary | ICD-10-CM | POA: Diagnosis present

## 2017-04-11 DIAGNOSIS — L089 Local infection of the skin and subcutaneous tissue, unspecified: Secondary | ICD-10-CM | POA: Diagnosis present

## 2017-04-11 DIAGNOSIS — L97529 Non-pressure chronic ulcer of other part of left foot with unspecified severity: Secondary | ICD-10-CM | POA: Diagnosis present

## 2017-04-11 DIAGNOSIS — E871 Hypo-osmolality and hyponatremia: Secondary | ICD-10-CM | POA: Diagnosis present

## 2017-04-11 DIAGNOSIS — Z880 Allergy status to penicillin: Secondary | ICD-10-CM

## 2017-04-11 DIAGNOSIS — Z7984 Long term (current) use of oral hypoglycemic drugs: Secondary | ICD-10-CM

## 2017-04-11 DIAGNOSIS — E11621 Type 2 diabetes mellitus with foot ulcer: Secondary | ICD-10-CM | POA: Diagnosis not present

## 2017-04-11 DIAGNOSIS — I152 Hypertension secondary to endocrine disorders: Secondary | ICD-10-CM | POA: Diagnosis present

## 2017-04-11 DIAGNOSIS — L02619 Cutaneous abscess of unspecified foot: Secondary | ICD-10-CM | POA: Diagnosis present

## 2017-04-11 DIAGNOSIS — Z89411 Acquired absence of right great toe: Secondary | ICD-10-CM | POA: Diagnosis not present

## 2017-04-11 DIAGNOSIS — M5136 Other intervertebral disc degeneration, lumbar region: Secondary | ICD-10-CM | POA: Diagnosis present

## 2017-04-11 DIAGNOSIS — Z89421 Acquired absence of other right toe(s): Secondary | ICD-10-CM

## 2017-04-11 DIAGNOSIS — M14671 Charcot's joint, right ankle and foot: Secondary | ICD-10-CM

## 2017-04-11 DIAGNOSIS — Z7982 Long term (current) use of aspirin: Secondary | ICD-10-CM | POA: Diagnosis not present

## 2017-04-11 DIAGNOSIS — E119 Type 2 diabetes mellitus without complications: Secondary | ICD-10-CM

## 2017-04-11 DIAGNOSIS — L97512 Non-pressure chronic ulcer of other part of right foot with fat layer exposed: Secondary | ICD-10-CM

## 2017-04-11 DIAGNOSIS — E11628 Type 2 diabetes mellitus with other skin complications: Secondary | ICD-10-CM | POA: Diagnosis present

## 2017-04-11 DIAGNOSIS — E11610 Type 2 diabetes mellitus with diabetic neuropathic arthropathy: Secondary | ICD-10-CM | POA: Diagnosis present

## 2017-04-11 DIAGNOSIS — M14672 Charcot's joint, left ankle and foot: Secondary | ICD-10-CM

## 2017-04-11 DIAGNOSIS — I10 Essential (primary) hypertension: Secondary | ICD-10-CM | POA: Diagnosis present

## 2017-04-11 DIAGNOSIS — E1142 Type 2 diabetes mellitus with diabetic polyneuropathy: Secondary | ICD-10-CM | POA: Diagnosis present

## 2017-04-11 DIAGNOSIS — D649 Anemia, unspecified: Secondary | ICD-10-CM | POA: Diagnosis present

## 2017-04-11 DIAGNOSIS — L03116 Cellulitis of left lower limb: Secondary | ICD-10-CM

## 2017-04-11 DIAGNOSIS — L97519 Non-pressure chronic ulcer of other part of right foot with unspecified severity: Secondary | ICD-10-CM | POA: Diagnosis present

## 2017-04-11 DIAGNOSIS — Z79899 Other long term (current) drug therapy: Secondary | ICD-10-CM

## 2017-04-11 DIAGNOSIS — L97502 Non-pressure chronic ulcer of other part of unspecified foot with fat layer exposed: Secondary | ICD-10-CM

## 2017-04-11 LAB — BASIC METABOLIC PANEL
ANION GAP: 10 (ref 5–15)
BUN: 12 mg/dL (ref 6–20)
CALCIUM: 8.4 mg/dL — AB (ref 8.9–10.3)
CO2: 25 mmol/L (ref 22–32)
Chloride: 95 mmol/L — ABNORMAL LOW (ref 101–111)
Creatinine, Ser: 0.75 mg/dL (ref 0.61–1.24)
Glucose, Bld: 303 mg/dL — ABNORMAL HIGH (ref 65–99)
Potassium: 3.5 mmol/L (ref 3.5–5.1)
SODIUM: 130 mmol/L — AB (ref 135–145)

## 2017-04-11 LAB — CBC WITH DIFFERENTIAL/PLATELET
BASOS ABS: 0 10*3/uL (ref 0.0–0.1)
BASOS PCT: 0 %
EOS PCT: 0 %
Eosinophils Absolute: 0 10*3/uL (ref 0.0–0.7)
HCT: 36.2 % — ABNORMAL LOW (ref 39.0–52.0)
Hemoglobin: 12.5 g/dL — ABNORMAL LOW (ref 13.0–17.0)
Lymphocytes Relative: 11 %
Lymphs Abs: 1 10*3/uL (ref 0.7–4.0)
MCH: 30.1 pg (ref 26.0–34.0)
MCHC: 34.5 g/dL (ref 30.0–36.0)
MCV: 87.2 fL (ref 78.0–100.0)
MONO ABS: 0.6 10*3/uL (ref 0.1–1.0)
Monocytes Relative: 6 %
NEUTROS ABS: 7.8 10*3/uL — AB (ref 1.7–7.7)
Neutrophils Relative %: 83 %
PLATELETS: 234 10*3/uL (ref 150–400)
RBC: 4.15 MIL/uL — ABNORMAL LOW (ref 4.22–5.81)
RDW: 11.8 % (ref 11.5–15.5)
WBC: 9.4 10*3/uL (ref 4.0–10.5)

## 2017-04-11 LAB — GLUCOSE, CAPILLARY: GLUCOSE-CAPILLARY: 179 mg/dL — AB (ref 65–99)

## 2017-04-11 LAB — SEDIMENTATION RATE: Sed Rate: 64 mm/h — ABNORMAL HIGH (ref 0–16)

## 2017-04-11 LAB — C-REACTIVE PROTEIN: CRP: 17.8 mg/dL — ABNORMAL HIGH

## 2017-04-11 MED ORDER — CEFTRIAXONE SODIUM 2 G IJ SOLR
2.0000 g | INTRAMUSCULAR | Status: DC
  Administered 2017-04-11 – 2017-04-12 (×2): 2 g via INTRAVENOUS
  Filled 2017-04-11 (×5): qty 2

## 2017-04-11 MED ORDER — ONDANSETRON HCL 4 MG PO TABS: 4.0000 mg | ORAL_TABLET | Freq: Four times a day (QID) | ORAL | Status: DC | PRN

## 2017-04-11 MED ORDER — GADOBENATE DIMEGLUMINE 529 MG/ML IV SOLN
20.0000 mL | Freq: Once | INTRAVENOUS | Status: AC | PRN
  Administered 2017-04-11: 20 mL via INTRAVENOUS

## 2017-04-11 MED ORDER — DEXTROSE 5 % IV SOLN
INTRAVENOUS | Status: AC
  Filled 2017-04-11: qty 2

## 2017-04-11 MED ORDER — ACETAMINOPHEN 650 MG RE SUPP: 650.0000 mg | Freq: Four times a day (QID) | RECTAL | Status: DC | PRN

## 2017-04-11 MED ORDER — ACETAMINOPHEN 325 MG PO TABS: 650.0000 mg | ORAL_TABLET | Freq: Four times a day (QID) | ORAL | Status: DC | PRN

## 2017-04-11 MED ORDER — DOCUSATE SODIUM 100 MG PO CAPS
100.0000 mg | ORAL_CAPSULE | Freq: Two times a day (BID) | ORAL | Status: DC
  Filled 2017-04-11 (×2): qty 1

## 2017-04-11 MED ORDER — VANCOMYCIN HCL IN DEXTROSE 1-5 GM/200ML-% IV SOLN
1000.0000 mg | Freq: Three times a day (TID) | INTRAVENOUS | Status: DC
  Administered 2017-04-12 – 2017-04-13 (×5): 1000 mg via INTRAVENOUS
  Filled 2017-04-11 (×3): qty 200

## 2017-04-11 MED ORDER — INSULIN GLARGINE 100 UNIT/ML ~~LOC~~ SOLN
28.0000 [IU] | Freq: Every day | SUBCUTANEOUS | Status: DC
  Administered 2017-04-11: 28 [IU] via SUBCUTANEOUS
  Filled 2017-04-11 (×2): qty 0.28

## 2017-04-11 MED ORDER — HYDROCODONE-ACETAMINOPHEN 10-325 MG PO TABS: 1.0000 | ORAL_TABLET | Freq: Four times a day (QID) | ORAL | Status: DC | PRN

## 2017-04-11 MED ORDER — ONDANSETRON HCL 4 MG/2ML IJ SOLN: 4.0000 mg | Freq: Four times a day (QID) | INTRAMUSCULAR | Status: DC | PRN

## 2017-04-11 MED ORDER — INSULIN ASPART 100 UNIT/ML ~~LOC~~ SOLN
0.0000 [IU] | Freq: Three times a day (TID) | SUBCUTANEOUS | Status: DC
  Administered 2017-04-12: 3 [IU] via SUBCUTANEOUS
  Administered 2017-04-12: 5 [IU] via SUBCUTANEOUS
  Administered 2017-04-12 – 2017-04-13 (×3): 3 [IU] via SUBCUTANEOUS

## 2017-04-11 MED ORDER — LISINOPRIL 5 MG PO TABS
5.0000 mg | ORAL_TABLET | Freq: Every day | ORAL | Status: DC
Start: 2017-04-11 — End: 2017-04-13
  Administered 2017-04-12 – 2017-04-13 (×3): 5 mg via ORAL
  Filled 2017-04-11 (×4): qty 1

## 2017-04-11 MED ORDER — VANCOMYCIN HCL IN DEXTROSE 1-5 GM/200ML-% IV SOLN
1000.0000 mg | Freq: Once | INTRAVENOUS | Status: AC
  Administered 2017-04-11: 1000 mg via INTRAVENOUS
  Filled 2017-04-11: qty 200

## 2017-04-11 MED ORDER — ASPIRIN EC 81 MG PO TBEC
81.0000 mg | DELAYED_RELEASE_TABLET | Freq: Every day | ORAL | Status: DC
  Administered 2017-04-11 – 2017-04-13 (×3): 81 mg via ORAL
  Filled 2017-04-11 (×6): qty 1

## 2017-04-11 MED ORDER — LACTATED RINGERS IV SOLN
INTRAVENOUS | Status: DC
  Administered 2017-04-12 (×2): via INTRAVENOUS

## 2017-04-11 MED ORDER — METRONIDAZOLE IN NACL 5-0.79 MG/ML-% IV SOLN
500.0000 mg | Freq: Three times a day (TID) | INTRAVENOUS | Status: DC
  Administered 2017-04-12 – 2017-04-13 (×5): 500 mg via INTRAVENOUS
  Filled 2017-04-11 (×5): qty 100

## 2017-04-11 NOTE — ED Provider Notes (Signed)
[facility]-EMERGENCY DEPT Provider Note   CSN: [id number] Arrival date & time: 04/11/17  1429     History   Chief Complaint Chief Complaint  Patient presents with  . Foot Ulcer    HPI [name] is a 51 y.o. male.  The history is provided by the patient.  He was sent here from his podiatrist's office because of failure to respond to antibiotics. 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 He also in the left foot is now developing some erythema surrounding it, and his podiatrist was able to probe the wound down to the level of bone. He has been on indomethacin for the last 7 days, and erythema around the foot has progressed in that time, and the depth of the wound has been able to probe has increased. He denies fever, chills, sweats. He has severe neuropathy with Charcot joint and is not complaining of any pain.  Past Medical History:  Diagnosis Date  . Boil    numerous boils in past  . Charcot ankle   . DDD (degenerative disc disease), lumbar   . Diabetes mellitus   . Hypertension   . Neuropathy associated with endocrine disorder (HCC)   . Osteomyelitis of toe of right foot [facility])     Patient Active Problem List   Diagnosis Date Noted  . Cellulitis and abscess of foot 02/08/2017  . Peripheral edema 10/02/2016  . Polycythemia 12/09/2015  . Dizziness 12/09/2015  . Type 2 diabetes mellitus (HCC) 07/22/2015  . Splinter 06/18/2014  . Osteomyelitis of toe of right foot (HCC) 06/13/2014  . Osteomyelitis (HCC) 06/12/2014  . Neuropathy in diabetes (HCC) 03/31/2014  . Charcot ankle 03/09/2014  . Essential hypertension, benign 03/03/2013  . Diabetes (HCC) 03/03/2013  . DDD (degenerative disc disease), lumbar 03/03/2013    Past Surgical History:  Procedure Laterality Date  . AMPUTATION Right 06/17/2014   Procedure: PARTIAL AMPUTATION RIGHT 3RD TOE;  Surgeon: [name], DPM;  Location: [facility];  Service: Podiatry;  Laterality: Right;  . AMPUTATION Right  09/02/2014   Procedure: PARTIAL AMPUTATION 2ND TOE RIGHT FOOT;  Surgeon: [name], DPM;  Location: [facility];  Service: Podiatry;  Laterality: Right;  . AMPUTATION Right 04/01/2015   Procedure: AMPUTATION DIGIT 1ST TOE RIGHT FOOT;  Surgeon: Laurell Josephs, DPM;  Location: [facility];  Service: Podiatry;  Laterality: Right;  . AMPUTATION Right 06/29/2016   pinkeye   . HERNIA REPAIR Bilateral    and 3rd hernia repair does not remember ehat side       Home Medications    Prior to Admission medications   Medication Sig Start Date End Date Taking? Authorizing Provider  aspirin (ASPIRIN EC) 81 MG EC tablet Take 81 mg by mouth daily. Swallow whole.    [provider]  colchicine 0.6 MG tablet Take 2 tablets  and 1 tab 1 hour later Patient not taking: Reported on 02/08/2017 07/11/16   Frederica Kuster, MD  doxycycline (VIBRA-TABS) 100 MG tablet Take 1 tablet (100 mg total) by mouth 2 (two) times daily. 02/09/17   Philip Aspen, Limmie Patricia, MD  fluticasone (FLONASE) 50 MCG/ACT nasal spray Place 2 sprays into both nostrils daily. 11/24/16   Frederica Kuster, MD  gabapentin (NEURONTIN) 600 MG tablet Take 1 tablet (600 mg total) by mouth at bedtime. 04/04/17   Mechele Claude, MD  glucose blood (BAYER CONTOUR TEST) test strip TEST BLOOD SUGAR UP TO TWICE DAILY 09/30/14   Nils Pyle  J, FNP  HYDROcodone-acetaminophen (NORCO) 10-325 MG tablet Take 1 tablet by mouth every 6 (six) hours as needed for moderate pain. 01/11/17   Frederica Kuster, MD  Insulin Glargine (BASAGLAR KWIKPEN) 100 UNIT/ML SOPN Inject 0.25-0.35 mLs (25-35 Units total) into the skin at bedtime. 04/04/17   Mechele Claude, MD  Insulin Pen Needle (BD PEN NEEDLE NANO U/F) 32G X 4 MM MISC USE WITH INSULIN ONCE DAILY 04/04/17   Mechele Claude, MD  lisinopril (PRINIVIL,ZESTRIL) 5 MG tablet Take 1 tablet (5 mg total) by mouth daily. 04/04/17   Mechele Claude, MD  metFORMIN (GLUCOPHAGE) 1000 MG tablet Take 1 tablet (1,000 mg total) by  mouth 2 (two) times daily. 04/04/17   Mechele Claude, MD  Multiple Vitamin (MULTIVITAMIN WITH MINERALS) TABS tablet Take 1 tablet by mouth daily.    [provider]    Family History Family History  Problem Relation Age of Onset  . Stroke Father   . Diabetes Brother   . Stroke Brother   . Diabetes Mother     Social History Social History  Substance Use Topics  . Smoking status: Never Smoker  . Smokeless tobacco: Never Used  . Alcohol use No     Allergies   Penicillins   Review of Systems Review of Systems  All other systems reviewed and are negative.    Physical Exam Updated Vital Signs BP 127/75 (BP Location: Right Arm)   Pulse 91   Temp 99 F (37.2 C) (Oral)   Resp 18   Ht 6\' 4"  (1.93 m)   Wt 96.2 kg (212 lb)   SpO2 98%   BMI 25.81 kg/m   Physical Exam  Nursing note and vitals reviewed.  51 year old male, resting comfortably and in no acute distress. Vital signs are normal. Oxygen saturation is 98%, which is normal. Head is normocephalic and atraumatic. PERRLA, EOMI. Oropharynx is clear. Neck is nontender and supple without adenopathy or JVD. Back is nontender and there is no CVA tenderness. Lungs are clear without rales, wheezes, or rhonchi. Chest is nontender. Heart has regular rate and rhythm without murmur. Abdomen is soft, flat, nontender without masses or hepatosplenomegaly and peristalsis is normoactive. Extremities have no cyanosis or edema. Charcot foot present bilaterally. Status post amputation of right first toe. Plantar ulceration on the right foot over the distal first metatarsal region measures 1.5 x 2.0 cm. There is no erythema or drainage. His ulcer cannot be probed beyond the exposed subcutaneous tissue. Ulceration in the left mid foot measuring 3.0 cm in diameter. There is no obvious drainage, but wound can be probed to a depth of approximately 5 mm. There is erythema of the skin of the left midfoot. Skin is warm and dry without  rash. Neurologic: Mental status is normal, cranial nerves are intact, there are no motor deficits. Peripheral neuropathy present.  ED Treatments / Results  Labs (all labs ordered are listed, but only abnormal results are displayed) Labs Reviewed  CBC WITH DIFFERENTIAL/PLATELET - Abnormal; Notable for the following:       Result Value   RBC 4.15 (*)    Hemoglobin 12.5 (*)    HCT 36.2 (*)    Neutro Abs 7.8 (*)    All other components within normal limits  BASIC METABOLIC PANEL - Abnormal; Notable for the following:    Sodium 130 (*)    Chloride 95 (*)    Glucose, Bld 303 (*)    Calcium 8.4 (*)    All other  components within normal limits  SEDIMENTATION RATE - Abnormal; Notable for the following:    Sed Rate 64 (*)    All other components within normal limits  AEROBIC CULTURE (SUPERFICIAL SPECIMEN)  C-REACTIVE PROTEIN    Radiology Dg Foot Complete Left  Result Date: 04/11/2017 CLINICAL DATA:  Soft tissue ulcers of the left foot. 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 There is new soft tissue swelling over that joint. There is no bone destruction. The patient has a flatfoot deformity. Ulceration on the plantar aspect of the midfoot is noted there is no evidence of adjacent bone destruction. Severe arthritic changes of the ankle joint. IMPRESSION: 1. No findings of osteomyelitis. 2. Marked degenerative changes in the midfoot and ankle with a flatfoot deformity. 3. Soft tissue swelling over the first tarsal metatarsal joint. Electronically Signed   By: Francene BoyersJames  Maxwell M.D.   On: 04/11/2017 16:50    Procedures Procedures (including critical care time)  Medications Ordered in ED Medications  vancomycin (VANCOCIN) IVPB 1000 mg/200 mL premix (0 mg Intravenous Stopped 04/11/17 1746)  gadobenate dimeglumine (MULTIHANCE) injection 20 mL (20 mLs Intravenous Contrast Given 04/11/17 1755)       Initial Impression / Assessment and Plan / ED Course  I have reviewed the triage vital signs and the nursing notes.  Pertinent labs & imaging results that were available during my care of the patient were reviewed by me and considered in my medical decision making (see chart for details).  Bilateral foot ulcers secondary to Charcot joint with worsening erythema of the left foot. This is in spite of one week of antibiotics and is a clear failure of outpatient management. Old records are reviewed, and he had been admitted for abscess and cellulitis of the left foot 2 months ago. He is given a dose of vancomycin and will be sent for plain films and MRI scans.  Foot x-ray does not show signs of osteomyelitis, MRI is pending. Laboratory workup to show elevated sedimentation rate at 64. Hyponatremia is present and unchanged from baseline. Mild anemia is present, which is a 2 g drop from last hemoglobin which was obtained months ago. Case is discussed with Dr. Ophelia CharterYates of [facility] who agrees to admit the patient.  Final Clinical Impressions(s) / ED Diagnoses   Final diagnoses:  Foot ulcer with fat layer exposed (HCC)  Cellulitis of left foot  Skin ulcer of left foot with fat layer exposed (HCC)  Skin ulcer of right foot with fat layer exposed (HCC)  Charcot's joint, right ankle and foot  Charcot's joint, left ankle and foot  Hyponatremia  Normochromic normocytic anemia    New Prescriptions New Prescriptions   No medications on file     Dione BoozeGlick, Areesha Dehaven, MD 04/11/17 1900

## 2017-04-11 NOTE — ED Triage Notes (Signed)
Pt has chronic diabetic foot ulcers to both feet, right foot is stable.  Per PCP left foot probes to bone with peri wound erythema. Pt has been on Clindamycin 300mg . PCP sent to ED to get IV antibiotics

## 2017-04-11 NOTE — ED Notes (Signed)
Pt returned from xray

## 2017-04-11 NOTE — ED Notes (Signed)
Pt returned from MRI °

## 2017-04-11 NOTE — H&P (Signed)
History and Physical    [name] VQX:[id number] DOB: 1966/03/19 DOA: 04/11/2017  PCP: Eustaquio Maize, MD Consultants:  [name] - podiatry Patient coming from: home - lives with his parents; NOK: parents, [phone]  Chief Complaint: sent by podiatry for foot ulcer  HPI: [name] is a 51 y.o. male with medical history significant of prior amputations, HTN, DM, and Charcot feet presenting with chronic left foot ulcer which appears to be worsening.  He reports that it started with a small cyst.  He says a doctor at [facility] popped it and sent him to podiatry.  The podiatrist on call cut on it a little bit and since then Dr. [name] has been treating it.  Last Wednesday he noticed redness.  He was started on PO Clindamycin on Thursday.  It started really bothering him Tuesday - his leg was getting numb, more red, draining watery discharge.  Prior right toe amputation, similar presentation.  No fevers, feels fine otherwise.  He saw Dr. [name] today, who gave him a CAM walker and sent him to the ER.    Letter today from Dr. [name] at [facility]: "The patient was seen in my office today.  He suffers from chronic ulcerations of both feet.  The right foot appears stable.  The ulceration of the left foot probes to bone with periwound erythema.  He has been on  clindaymcin 300 mg TID.  I have instructed the patient to go to emergency room for evaluation, admission, and IV antibiotics."   ED Course:  B foot ulcers with Charcot foot and worsening erythema of the left foot despite 1 week of PO antibiotics and failure of outpatient management.  Given Vanc and sent for plain films (negative for osteomyelitis) and MRI.  ESR 64.  Stable hyponatremia.  Anemia worse than prior.  Review of Systems: As per HPI; otherwise review of systems reviewed and negative.   Ambulatory Status:  Ambulates without assistance  Past Medical History:  Diagnosis Date  . Boil    numerous  boils in past  . Charcot ankle   . DDD (degenerative disc disease), lumbar   . Diabetes mellitus   . Hypertension   . Neuropathy associated with endocrine disorder ([place])   . Osteomyelitis of toe of right foot [facility])     Past Surgical History:  Procedure Laterality Date  . AMPUTATION Right 06/17/2014   Procedure: PARTIAL AMPUTATION RIGHT 3RD TOE;  Surgeon: Marcheta Grammes, DPM;  Location: [facility];  Service: Podiatry;  Laterality: Right;  . AMPUTATION Right 09/02/2014   Procedure: PARTIAL AMPUTATION 2ND TOE RIGHT FOOT;  Surgeon: Marcheta Grammes, DPM;  Location: [facility];  Service: Podiatry;  Laterality: Right;  . AMPUTATION Right 04/01/2015   Procedure: AMPUTATION DIGIT 1ST TOE RIGHT FOOT;  Surgeon: Juanita Laster, DPM;  Location: [facility];  Service: Podiatry;  Laterality: Right;  . AMPUTATION Right 06/29/2016   pinkeye   . HERNIA REPAIR Bilateral    and 3rd hernia repair does not remember ehat side    Social History   Social History  . Marital status: Single    Spouse name: N/A  . Number of children: N/A  . Years of education: N/A   Occupational History  . Receiving    Social History Main Topics  . Smoking status: Never Smoker  . Smokeless tobacco: Never Used  . Alcohol use No  . Drug use: No  . Sexual activity: Yes    Birth control/  protection: None   Other Topics Concern  . Not on file   Social History Narrative  . No narrative on file    Allergies  Allergen Reactions  . Penicillins Other (See Comments)    Has patient had a PCN reaction causing immediate rash, facial/tongue/throat swelling, SOB or lightheadedness with hypotension: No Has patient had a PCN reaction causing severe rash involving mucus membranes or skin necrosis: No Has patient had a PCN reaction that required hospitalization No Has patient had a PCN reaction occurring within the last 10 years: No If all of the above answers are "NO", then may proceed with Cephalosporin use.     Family  History  Problem Relation Age of Onset  . Stroke Father   . Diabetes Brother   . Stroke Brother   . Diabetes Mother     Prior to Admission medications   Medication Sig Start Date End Date Taking? Authorizing Provider  aspirin (ASPIRIN EC) 81 MG EC tablet Take 81 mg by mouth daily. Swallow whole.   Yes [provider]  gabapentin (NEURONTIN) 600 MG tablet Take 1 tablet (600 mg total) by mouth at bedtime. 04/04/17  Yes Stacks, Cletus Gash, MD  glucose blood (BAYER CONTOUR TEST) test strip TEST BLOOD SUGAR UP TO TWICE DAILY 09/30/14  Yes Lysbeth Penner, FNP  HYDROcodone-acetaminophen (NORCO) 10-325 MG tablet Take 1 tablet by mouth every 6 (six) hours as needed for moderate pain. 01/11/17  Yes Wardell Honour, MD  Insulin Glargine (BASAGLAR KWIKPEN) 100 UNIT/ML SOPN Inject 0.25-0.35 mLs (25-35 Units total) into the skin at bedtime. Patient taking differently: Inject 28 Units into the skin at bedtime.  04/04/17  Yes Stacks, Cletus Gash, MD  lisinopril (PRINIVIL,ZESTRIL) 5 MG tablet Take 1 tablet (5 mg total) by mouth daily. 04/04/17  Yes Stacks, Cletus Gash, MD  clindamycin (CLEOCIN) 300 MG capsule 1 CAPSULE 3 TIMES A DAY FOR 10 DAYS 04/05/17   [provider]  Insulin Pen Needle (BD PEN NEEDLE NANO U/F) 32G X 4 MM MISC USE WITH INSULIN ONCE DAILY 04/04/17   Claretta Fraise, MD  metFORMIN (GLUCOPHAGE) 1000 MG tablet Take 1 tablet (1,000 mg total) by mouth 2 (two) times daily. 04/04/17   Claretta Fraise, MD    Physical Exam: Vitals:   04/11/17 1440  BP: 127/75  Pulse: 91  Resp: 18  Temp: 99 F (37.2 C)  TempSrc: Oral  SpO2: 98%  Weight: 96.2 kg (212 lb)  Height: _0  (1.93 m)     General:  Appears calm and comfortable and is NAD Eyes:  PERRL, EOMI, normal lids, iris ENT:  grossly normal hearing, lips & tongue, mmm Neck:  no LAD, masses or thyromegaly Cardiovascular:  RRR, no m/r/g. No LE edema.  Respiratory:  CTA bilaterally, no w/r/r. Normal respiratory effort. Abdomen:  soft,  ntnd, NABS Skin:  Foot ulcers on the right plantar first MT head (adjacent to amputation) and the left midfoot medially.  The left foot has surrounding erythema on the plantar and dorsal surfaces encompassing most of the foot.  No foul smelling drainage noted and no obvious purulence. Musculoskeletal:  grossly normal tone BUE/BLE, good ROM, no bony abnormality.  S/p amputation of right great toe. Psychiatric:  grossly normal mood and affect, speech fluent and appropriate, AOx3 Neurologic:  CN 2-12 grossly intact, moves all extremities in coordinated fashion, sensation intact  Labs on Admission: I have personally reviewed following labs and imaging studies  CBC:  Recent Labs Lab 04/11/17 1644  WBC 9.4  NEUTROABS 7.8*  HGB 12.5*  HCT 36.2*  MCV 87.2  PLT 099   Basic Metabolic Panel:  Recent Labs Lab 04/11/17 1644  NA 130*  K 3.5  CL 95*  CO2 25  GLUCOSE 303*  BUN 12  CREATININE 0.75  CALCIUM 8.4*   GFR: Estimated Creatinine Clearance: 132.6 mL/min (by C-G formula based on SCr of 0.75 mg/dL). Liver Function Tests: No results for input(s): AST, ALT, ALKPHOS, BILITOT, PROT, ALBUMIN in the last 168 hours. No results for input(s): LIPASE, AMYLASE in the last 168 hours. No results for input(s): AMMONIA in the last 168 hours. Coagulation Profile: No results for input(s): INR, PROTIME in the last 168 hours. Cardiac Enzymes: No results for input(s): CKTOTAL, CKMB, CKMBINDEX, TROPONINI in the last 168 hours. BNP (last 3 results) No results for input(s): PROBNP in the last 8760 hours. HbA1C: No results for input(s): HGBA1C in the last 72 hours. CBG: No results for input(s): GLUCAP in the last 168 hours. Lipid Profile: No results for input(s): CHOL, HDL, LDLCALC, TRIG, CHOLHDL, LDLDIRECT in the last 72 hours. Thyroid Function Tests: No results for input(s): TSH, T4TOTAL, FREET4, T3FREE, THYROIDAB in the last 72 hours. Anemia Panel: No results for input(s): VITAMINB12, FOLATE,  FERRITIN, TIBC, IRON, RETICCTPCT in the last 72 hours. Urine analysis:    Component Value Date/Time   COLORURINE YELLOW 06/13/2014 1048   APPEARANCEUR CLEAR 06/13/2014 1048   LABSPEC 1.020 06/13/2014 1048   PHURINE 6.0 06/13/2014 1048   GLUCOSEU >1000 (A) 06/13/2014 1048   HGBUR NEGATIVE 06/13/2014 1048   BILIRUBINUR NEGATIVE 06/13/2014 1048   BILIRUBINUR negative 03/26/2014 1212   KETONESUR NEGATIVE 06/13/2014 1048   PROTEINUR NEGATIVE 06/13/2014 1048   UROBILINOGEN 0.2 06/13/2014 1048   NITRITE NEGATIVE 06/13/2014 1048   LEUKOCYTESUR NEGATIVE 06/13/2014 1048    Creatinine Clearance: Estimated Creatinine Clearance: 132.6 mL/min (by C-G formula based on SCr of 0.75 mg/dL).  Sepsis Labs: _0 (procalcitonin:4,lacticidven:4) )No results found for this or any previous visit (from the past 240 hour(s)).   Radiological Exams on Admission: [name]  Result Date: 04/11/2017 CLINICAL DATA:  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 Foot ulcer with fat varix post and current clinical exam. Patient has been on clindamycin. EXAM: MRI OF THE LEFT FOREFOOT WITHOUT AND WITH CONTRAST TECHNIQUE: Multiplanar, multisequence MR imaging of the right forefoot was performed both before and after administration of intravenous contrast. CONTRAST:  55m MULTIHANCE GADOBENATE DIMEGLUMINE 529 MG/ML IV SOLN COMPARISON:  02/08/2017 FINDINGS: Bones/Joint/Cartilage Interval diffuse reactive marrow edema of the medial cuneiform and first metatarsal base through metatarsal neck as well as across the second TMT joint involving the middle cuneiform and base of second metatarsal secondary to chronic plantar soft tissue ulceration and inflammation. Chronic sub cortical cystic change is again noted along the plantar aspect of the first metatarsal base with minimal osteophytosis. No frank bone destruction is apparent. Ligaments Intact Muscles and Tendons No intramuscular fluid collections. Mild  intrinsic muscle atrophy of the forefoot. Soft tissues Diffuse inflammatory change about medial aspect of the forefoot with soft tissue ulceration along the plantar and medial aspect of the first metatarsal and medial cuneiform. IMPRESSION: 1. Prominent soft tissue ulceration along the plantar medial aspect of the forefoot adjacent to the first TMT articulation with inflammatory soft tissue edema consistent with cellulitis. 2. Diffuse pattern of marrow edema noted of the medial cuneiform and first metatarsal and to a lesser degree the middle cuneiform and base of second metatarsal in a  pattern suggesting reactive marrow edema. More focal subcortical cystic change at the base of the medial cuneiform and first metatarsal are believed to be secondary to pre-existing osteoarthritis and less likely due to changes of an early osteomyelitis. No definite evidence of septic arthritis. Electronically Signed   By: Ashley Royalty M.D.   On: 04/11/2017 18:56   Dg Foot Complete Left  Result Date: 04/11/2017 CLINICAL DATA:  Soft tissue ulcers of the left foot. 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 There is new soft tissue swelling over that joint. There is no bone destruction. The patient has a flatfoot deformity. Ulceration on the plantar aspect of the midfoot is noted there is no evidence of adjacent bone destruction. Severe arthritic changes of the ankle joint. IMPRESSION: 1. No findings of osteomyelitis. 2. Marked degenerative changes in the midfoot and ankle with a flatfoot deformity. 3. Soft tissue swelling over the first tarsal metatarsal joint. Electronically Signed   By: Lorriane Shire M.D.   On: 04/11/2017 16:50    EKG: Not done  Assessment/Plan Principal Problem:   Diabetic foot infection ([place]) Active Problems:   Essential hypertension, benign   Charcot ankle   Type 2 diabetes mellitus (H[facility])    Cellulitis and abscess of foot   Diabetic foot infection with ulceration and cellulitis in patient with Charcot foot deformity -MRI performed and appears to shows cellulitis but does not rule out osteomyelitis -Will admit, Med Surg -Antibiotics with Rocephin, Flagyl, and Vancomycin per diabetic foot ulcer order set -May need consult by orthopedics/surgery -Podiatry consult requested -Diabetic foot infection order set utilized, including orders for CM, SW, DM coordinator, wound care, and nutrition consult. -Goal would be for glucose <150 to facilitate wound healing. -Patient should be on bed rest, non-weight bearing. -Excellent BP control is needed  -Na 130 (corrected 133), prior 133 -Hgb 12.5, prior 14.7 -ESR 64 -Additional pending labs include: CRP; blood cultures x 2; pre-albumin; and aerobic and anaerobic cultures. -Continue ASA -Will order ABIs to evaluate circulation since this is essential for wound healing.  DM -Glucose 303 -Hold Metformin -Continue insulin glargine -Cover with SSI  HTN -Continue Lisinopril   DVT prophylaxis: SCDs Code Status:  Full - confirmed with patient Family Communication: None present Disposition Plan:  Home once clinically improved Consults called: Podiatry; SW/CM/wound care/DM coordinator/nutrition Admission status: Admit - It is my clinical opinion that admission to INPATIENT is reasonable and necessary because this patient will require at least 2 midnights in the hospital to treat this condition based on the medical complexity of the problems presented.  Given the aforementioned information, the predictability of an adverse outcome is felt to be significant.    Karmen Bongo MD [facility]  If 7PM-7AM, please contact night-coverage [email or web address] Password [facility]  04/11/2017, 7:57 PM

## 2017-04-11 NOTE — ED Notes (Signed)
Dr. Yates in room 

## 2017-04-11 NOTE — ED Notes (Signed)
Patient transported to X-ray 

## 2017-04-11 NOTE — Progress Notes (Signed)
Pharmacy Antibiotic Note  [name] is a 51 y.o. male admitted on 04/11/2017 with diabetic foot.  Pharmacy has been consulted for Vancomycin dosing.  Plan: Vancomycin 1gm IV every 8 hours.  Goal trough 15-20 mcg/mL.  Monitor labs, micro and vitals.    Height: 6\' 4"  (193 cm) Weight: 201 lb 8 oz (91.4 kg) IBW/kg (Calculated) : 86.8  Temp (24hrs), Avg:98.4 F (36.9 C), Min:97.8 F (36.6 C), Max:99 F (37.2 C)   Recent Labs Lab 04/11/17 1644  WBC 9.4  CREATININE 0.75    Estimated Creatinine Clearance: 132.6 mL/min (by C-G formula based on SCr of 0.75 mg/dL).    Allergies  Allergen Reactions  . Penicillins Other (See Comments)    Has patient had a PCN reaction causing immediate rash, facial/tongue/throat swelling, SOB or lightheadedness with hypotension: No Has patient had a PCN reaction causing severe rash involving mucus membranes or skin necrosis: No Has patient had a PCN reaction that required hospitalization No Has patient had a PCN reaction occurring within the last 10 years: No If all of the above answers are "NO", then may proceed with Cephalosporin use.    Antimicrobials this admission: Vanc 6/6 >>  Rocephin 6/6 >>  Flagyl 6/6 >>   Dose adjustments this admission: n/a   Microbiology results: 6/6 BCx: pending  Thank you for allowing pharmacy to be a part of this patient's care.  Mady GemmaHayes, Kerem Gilmer R 04/11/2017 9:22 PM

## 2017-04-12 ENCOUNTER — Inpatient Hospital Stay (HOSPITAL_COMMUNITY): Payer: BLUE CROSS/BLUE SHIELD

## 2017-04-12 DIAGNOSIS — L03116 Cellulitis of left lower limb: Secondary | ICD-10-CM

## 2017-04-12 DIAGNOSIS — M14672 Charcot's joint, left ankle and foot: Secondary | ICD-10-CM

## 2017-04-12 DIAGNOSIS — L089 Local infection of the skin and subcutaneous tissue, unspecified: Secondary | ICD-10-CM

## 2017-04-12 DIAGNOSIS — E11628 Type 2 diabetes mellitus with other skin complications: Secondary | ICD-10-CM

## 2017-04-12 LAB — GLUCOSE, CAPILLARY
GLUCOSE-CAPILLARY: 245 mg/dL — AB (ref 65–99)
GLUCOSE-CAPILLARY: 186 mg/dL — AB (ref 65–99)
Glucose-Capillary: 182 mg/dL — ABNORMAL HIGH (ref 65–99)

## 2017-04-12 LAB — BASIC METABOLIC PANEL
ANION GAP: 8 (ref 5–15)
BUN: 10 mg/dL (ref 6–20)
CHLORIDE: 98 mmol/L — AB (ref 101–111)
CO2: 29 mmol/L (ref 22–32)
CREATININE: 0.75 mg/dL (ref 0.61–1.24)
Calcium: 8.7 mg/dL — ABNORMAL LOW (ref 8.9–10.3)
GFR calc non Af Amer: >60 mL/min (ref >60)
Glucose, Bld: 220 mg/dL — ABNORMAL HIGH (ref 65–99)
POTASSIUM: 3.5 mmol/L (ref 3.5–5.1)
Sodium: 135 mmol/L (ref 135–145)

## 2017-04-12 LAB — CBC
HCT: 36.8 % — ABNORMAL LOW (ref 39.0–52.0)
HEMOGLOBIN: 12.3 g/dL — AB (ref 13.0–17.0)
MCH: 29.6 pg (ref 26.0–34.0)
MCHC: 33.4 g/dL (ref 30.0–36.0)
MCV: 88.7 fL (ref 78.0–100.0)
PLATELETS: 258 10*3/uL (ref 150–400)
RBC: 4.15 MIL/uL — AB (ref 4.22–5.81)
RDW: 11.5 % (ref 11.5–15.5)
WBC: 7.6 10*3/uL (ref 4.0–10.5)

## 2017-04-12 LAB — PREALBUMIN: Prealbumin: 9.6 mg/dL — ABNORMAL LOW (ref 18–38)

## 2017-04-12 MED ORDER — INSULIN ASPART 100 UNIT/ML ~~LOC~~ SOLN: 0.0000 [IU] | Freq: Every day | SUBCUTANEOUS | Status: DC

## 2017-04-12 MED ORDER — PRO-STAT SUGAR FREE PO LIQD
30.0000 mL | Freq: Two times a day (BID) | ORAL | Status: DC
  Administered 2017-04-12 – 2017-04-13 (×2): 30 mL via ORAL
  Filled 2017-04-12 (×2): qty 30

## 2017-04-12 MED ORDER — INSULIN GLARGINE 100 UNIT/ML ~~LOC~~ SOLN
30.0000 [IU] | Freq: Every day | SUBCUTANEOUS | Status: DC
  Administered 2017-04-12: 30 [IU] via SUBCUTANEOUS
  Filled 2017-04-12 (×4): qty 0.3

## 2017-04-12 MED ORDER — ADULT MULTIVITAMIN W/MINERALS CH
1.0000 | ORAL_TABLET | Freq: Every day | ORAL | Status: DC
  Administered 2017-04-12 – 2017-04-13 (×2): 1 via ORAL
  Filled 2017-04-12 (×2): qty 1

## 2017-04-12 MED ORDER — LIVING WELL WITH DIABETES BOOK
Freq: Once | Status: AC
  Administered 2017-04-12: 15:00:00
  Filled 2017-04-12: qty 1

## 2017-04-12 NOTE — Consult Note (Signed)
Podiatry Consult Note  Reason for Consultation:  Chronic ulceration of the left foot   History of Present Illness: [name] is a 51 y.o. male presented to my private office yesterday for evaluation of changes in the appearance of his left foot.  His foot was erythematous.  The ulceration appeared larger and probed to bone.  I was concerned by the cellulitic appearance of his foot and possible underlying osteomyelitis.  I referred to the emergency room for evaluation and admission.    He was placed on clindamycin last week by Dr. Posey Pronto.  He has been taking the antibiotic and tolerating it well.  He has been applying a collagen dressing to his left foot and changing the dressing every other day.  He has continued to work against medical advice.   Past Medical History:  Diagnosis Date  . Boil    numerous boils in past  . Charcot ankle   . DDD (degenerative disc disease), lumbar   . Diabetes mellitus   . Hypertension   . Neuropathy associated with endocrine disorder ([place])   . Osteomyelitis of toe of right foot ([facility])    Scheduled Meds: . aspirin EC  81 mg Oral Daily  . docusate sodium  100 mg Oral BID  . feeding supplement (PRO-STAT SUGAR FREE 64)  30 mL Oral BID  . insulin aspart  0-15 Units Subcutaneous TID WC  . insulin aspart  0-5 Units Subcutaneous QHS  . insulin glargine  30 Units Subcutaneous QHS  . lisinopril  5 mg Oral Daily  . multivitamin with minerals  1 tablet Oral Daily   Continuous Infusions: . cefTRIAXone (ROCEPHIN)  IV Stopped (04/11/17 2338)   And  . metronidazole 500 mg (04/12/17 1644)  . lactated ringers Stopped (04/12/17 1644)  . vancomycin 1,000 mg (04/12/17 1654)   PRN Meds:.acetaminophen **OR** acetaminophen, HYDROcodone-acetaminophen, ondansetron **OR** ondansetron (ZOFRAN) IV  Allergies  Allergen Reactions  . Penicillins Other (See Comments)    Has patient had a PCN reaction causing immediate rash, facial/tongue/throat swelling, SOB or  lightheadedness with hypotension: No Has patient had a PCN reaction causing severe rash involving mucus membranes or skin necrosis: No Has patient had a PCN reaction that required hospitalization No Has patient had a PCN reaction occurring within the last 10 years: No If all of the above answers are "NO", then may proceed with Cephalosporin use.    Past Surgical History:  Procedure Laterality Date  . AMPUTATION Right 06/17/2014   Procedure: PARTIAL AMPUTATION RIGHT 3RD TOE;  Surgeon: Marcheta Grammes, DPM;  Location: [facility];  Service: Podiatry;  Laterality: Right;  . AMPUTATION Right 09/02/2014   Procedure: PARTIAL AMPUTATION 2ND TOE RIGHT FOOT;  Surgeon: Marcheta Grammes, DPM;  Location: [facility];  Service: Podiatry;  Laterality: Right;  . AMPUTATION Right 04/01/2015   Procedure: AMPUTATION DIGIT 1ST TOE RIGHT FOOT;  Surgeon: Juanita Laster, DPM;  Location: [facility];  Service: Podiatry;  Laterality: Right;  . AMPUTATION Right 06/29/2016   pinkeye   . HERNIA REPAIR Bilateral    and 3rd hernia repair does not remember ehat side   Family History  Problem Relation Age of Onset  . Stroke Father   . Diabetes Brother   . Stroke Brother   . Diabetes Mother    Social History:  reports that he has never smoked. He has never used smokeless tobacco. He reports that he does not drink alcohol or use drugs.  Review of Systems: Significant for fever.  Denies  nausea, vomiting, chills or shortness of breath.  Physical Examination: Vital signs in last 24 hours:   Temp:  [97.8 F (36.6 C)-101 F (38.3 C)] 101 F (38.3 C) (06/07 0508) Pulse Rate:  [77-81] 81 (06/07 1011) Resp:  [16-18] 18 (06/07 1011) BP: (119-140)/(61-85) 119/61 (06/07 1011) SpO2:  [97 %-100 %] 98 % (06/07 1011) Weight:  [201 lb 8 oz (91.4 kg)] 201 lb 8 oz (91.4 kg) (06/06 2102)  Dressing intact on both feet with no strikethrough.  There is a full thickness ulceration of along the medial plantar aspect of the left midfoot.   The wound bed is pink, red and viable.  Periwound is hyperkeratotic and erythematous.  The erythema extends to the dorsal midfoot.  The ulceration probes to bone at 9 o'clock.  There is moderate serosanguineous exudate.  There is no malodor.  There is a full thickness ulceration along the plantar aspect of the right first metatarsal head.  The wound bed is granular.  Periwound is hyperkeratotic.  No erythema of the right foot.  Mild serosanguineous exudate.  No malodor.  No signs of infection of the right foot.   Pedal pulses are palpable bilaterally.  There is edema of the left foot.  There is collapse of the medial longitudinal arch of both feet.  Lab/Test Results:   Recent Labs  04/11/17 1644 04/12/17 0458  WBC 9.4 7.6  HGB 12.5* 12.3*  HCT 36.2* 36.8*  PLT 234 258  NA 130* 135  K 3.5 3.5  CL 95* 98*  CO2 25 29  BUN 12 10  CREATININE 0.75 0.75  GLUCOSE 303* 220*  CALCIUM 8.4* 8.7*    Recent Results (from the past 240 hour(s))  Aerobic Culture (superficial specimen)     Status: None (Preliminary result)   Collection Time: 04/11/17  3:57 PM  Result Value Ref Range Status   Specimen Description FOOT LEFT  Final   Special Requests NONE  Final   Gram Stain   Final    ABUNDANT WBC PRESENT, PREDOMINANTLY MONONUCLEAR RARE GRAM POSITIVE COCCI IN PAIRS    Culture   Final    CULTURE REINCUBATED FOR BETTER GROWTH Performed at [facility], [place] [address] [place], [place] [address]    Report Status PENDING  Incomplete  Blood Cultures x 2 sites     Status: None (Preliminary result)   Collection Time: 04/11/17  9:12 PM  Result Value Ref Range Status   Specimen Description RIGHT ANTECUBITAL  Final   Special Requests   Final    BOTTLES DRAWN AEROBIC AND ANAEROBIC Blood Culture results may not be optimal due to an inadequate volume of blood received in culture bottles   Culture NO GROWTH < 12 HOURS  Final   Report Status PENDING  Incomplete  Blood Cultures x 2 sites     Status:  None (Preliminary result)   Collection Time: 04/11/17  9:12 PM  Result Value Ref Range Status   Specimen Description BLOOD RIGHT HAND  Final   Special Requests   Final    BOTTLES DRAWN AEROBIC AND ANAEROBIC Blood Culture results may not be optimal due to an inadequate volume of blood received in culture bottles   Culture NO GROWTH < 12 HOURS  Final   Report Status PENDING  Incomplete     [name]  Result Date: 04/11/2017 CLINICAL DATA:  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 Foot ulcer with fat varix post and current clinical exam.  Patient has been on clindamycin. EXAM: MRI OF THE LEFT FOREFOOT WITHOUT AND WITH CONTRAST TECHNIQUE: Multiplanar, multisequence MR imaging of the right forefoot was performed both before and after administration of intravenous contrast. CONTRAST:  28m MULTIHANCE GADOBENATE DIMEGLUMINE 529 MG/ML IV SOLN COMPARISON:  02/08/2017 FINDINGS: Bones/Joint/Cartilage Interval diffuse reactive marrow edema of the medial cuneiform and first metatarsal base through metatarsal neck as well as across the second TMT joint involving the middle cuneiform and base of second metatarsal secondary to chronic plantar soft tissue ulceration and inflammation. Chronic sub cortical cystic change is again noted along the plantar aspect of the first metatarsal base with minimal osteophytosis. No frank bone destruction is apparent. Ligaments Intact Muscles and Tendons No intramuscular fluid collections. Mild intrinsic muscle atrophy of the forefoot. Soft tissues Diffuse inflammatory change about medial aspect of the forefoot with soft tissue ulceration along the plantar and medial aspect of the first metatarsal and medial cuneiform. IMPRESSION: 1. Prominent soft tissue ulceration along the plantar medial aspect of the forefoot adjacent to the first TMT articulation with inflammatory soft tissue edema consistent with cellulitis. 2. Diffuse pattern of marrow edema noted of the medial  cuneiform and first metatarsal and to a lesser degree the middle cuneiform and base of second metatarsal in a pattern suggesting reactive marrow edema. More focal subcortical cystic change at the base of the medial cuneiform and first metatarsal are believed to be secondary to pre-existing osteoarthritis and less likely due to changes of an early osteomyelitis. No definite evidence of septic arthritis. Electronically Signed   By: DAshley RoyaltyM.D.   On: 04/11/2017 18:56   UKoreaArterial Seg Single  Result Date: 04/12/2017 CLINICAL DATA:  51year old male with a diabetic foot infection. Evaluate for hemodynamically significant peripheral arterial disease. EXAM: NONINVASIVE PHYSIOLOGIC VASCULAR STUDY OF BILATERAL LOWER EXTREMITIES TECHNIQUE: Evaluation of both lower extremities was performed at rest, including calculation of ankle-brachial indices, multiple segmental pressure evaluation, segmental Doppler and segmental pulse volume recording. COMPARISON:  MRI of the left foot 04/11/2017 FINDINGS: Right ABI:  1.3 Left ABI:  1.3 Right Lower Extremity: Normal triphasic arterial waveforms at the ankle. Left Lower Extremity: Minimally abnormal biphasic arterial waveforms without significant dampening. IMPRESSION: Normal bilateral resting ankle-brachial indices and normal to minimally abnormal arterial waveforms. Signed, HCriselda Peaches MD [facility] Electronically Signed   By: HJacqulynn CadetM.D.   On: 04/12/2017 09:21   Dg Foot Complete Left  Result Date: 04/11/2017 CLINICAL DATA:  Soft tissue ulcers of the left foot. 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 There is new soft tissue swelling over that joint. There is no bone destruction. The patient has a flatfoot deformity. Ulceration on the plantar aspect of the midfoot is noted there is no  evidence of adjacent bone destruction. Severe arthritic changes of the ankle joint. IMPRESSION: 1. No findings of osteomyelitis. 2. Marked degenerative changes in the midfoot and ankle with a flatfoot deformity. 3. Soft tissue swelling over the first tarsal metatarsal joint. Electronically Signed   By: JLorriane ShireM.D.   On: 04/11/2017 16:50    Assessment: 1.  Cellulitis, left foot. 2.  Ulceration, left foot. 3.  Ulceration, right foot. 4.  Charcot arthropathy, both feet. 5.  Diabetes mellitus with peripheral neuropathy.  Plan: Radiographs and MRI negative for osteomyelitis.  Noninvasive arterial study revealed normal ABI and normal to minimally abnormal waveforms.  WBC within normal limits.  ESR and CRP elevated.  Likely cellulitis superimposed on possible acute charcot arthropathy of the left foot.  Agree with plan to discharge patient on oral antibiotics.  Recommend immobilization with CAM Walker.  He would benefit from [place] walker.  Will obtain as outpatient.  Limit weightbearing to bathroom privileges while wearing CAM Walker.  May not return to work.  Betadine dressing applied to both feet today.  He is to resume collagen dressings at home.  Follow-up with me in my [place] office on Tuesday.  Ashon Rosenberg IVAN 04/12/2017, 5:39 PM

## 2017-04-12 NOTE — Consult Note (Signed)
WOC nurse consulted as a part of DM foot ulcer order set.  It appears podiatry sent patient to the hospital for evaluation, admission and IV antibiotics.  Unclear if podiatry will see patient while inpatient.  MRI does not indicate osteomyelitis.  ABI's normal.   Discussed with bedside nurse, reviewed bedside nurse admission information, measurements and documentation for drainage.  I will order saline moist gauze for now until podiatry or ortho if needed completes any further surgical intervention.    Bedside nurse aware.   Discussed POC with patient and bedside nurse.  Re consult if needed, will not follow at this time. Thanks  Clarence Dunsmore M.D.[employer] MSN, RN,CWOCN, CNS [phone])

## 2017-04-12 NOTE — Progress Notes (Addendum)
Inpatient Diabetes Program Recommendations  AACE/ADA: New Consensus Statement on Inpatient Glycemic Control (2015)  Target Ranges:  Prepandial:   less than 140 mg/dL      Peak postprandial:   less than 180 mg/dL (1-2 hours)      Critically ill patients:  140 - 180 mg/dL  Results for [name], [name] (MRN [id number]) as of 04/12/2017 08:42  Ref. Range 04/11/2017 16:44 04/12/2017 04:58  Glucose Latest Ref Range: 65 - 99 mg/dL 528303 (H) 413220 (H)   Results for [name], [name] (MRN [id number]) as of 04/12/2017 08:42  Ref. Range 04/11/2017 21:32 04/12/2017 07:50  Glucose-Capillary Latest Ref Range: 65 - 99 mg/dL 536179 (H) 644182 (H)   Review of Glycemic Control  Diabetes history: DM2 Outpatient Diabetes medications: Basaglar 28-30 units QHS, Metformin 1000 mg BID Current orders for Inpatient glycemic control: Lantus 28 units QHS, Novolog 0-15 units TID with meals  Inpatient Diabetes Program Recommendations: Insulin - Basal: Please consider increasing Lantus to 30 units QHS. Correction (SSI): Please consider ordering Novolog 0-5 units QHS for bedtime correction scale. HgbA1C: A1C in process.  Addendum 04/12/17@14 :30-Spoke with patient about diabetes and home regimen for diabetes control. Patient reports that he is followed by PCP for diabetes management and currently he takes Basaglar 28-30 units QHS and Metformin 1000 mg BID as an outpatient for diabetes control. Patient reports that he is taking DM medications as prescribed.  Patient states that he does not consistently check his glucose but when he does check it, his glucose is usually in the upper 100's- mid 200's mg/dl.  Inquired about prior A1C and patient reports that his A1C has been good as 7.3% in the past but has recently gotten up to 12%.  Discussed last A1C results in the chart (10.8% on 02/08/17) and explained that his current A1C indicates an average glucose of 263 mg/dl over the past 2-3 months. Discussed glucose and A1C goals. Discussed importance of checking  CBGs and maintaining good CBG control to prevent long-term and short-term complications. Explained how hyperglycemia leads to damage within blood vessels which lead to the common complications seen with uncontrolled diabetes. Stressed to the patient the importance of improving glycemic control to prevent further complications from uncontrolled diabetes. Discussed impact of nutrition, exercise, stress, sickness, and medications on diabetes control. Patient reports that he has made several dietary changes over the past couple of months (after finding out his A1C was 12%), such as he has cut out bread and cookies completely and he is trying to be much more conscious of dietary choices.  Patient reports that he has lost a significant amount of weight since making dietary changes.  Discussed carb modified diet. Patient requested further dietary education on Carb Modified diet as well as more education on foods that promote wound healing. Placed consult for RD.  Encouraged patient to check his glucose more consistently to keep a log book of glucose readings which he will need to take to doctor appointments. Explained how the doctor he follows up with can use the log book to continue to make adjustments with DM medications if needed. Inquired about seeing an Endocrinologist in the past and patient states that he does not have an Actorndocrinologist. Encouraged patient to ask PCP about referring him to a local Endocrinologist to assist with improving DM control. Informed patient that the Living Well with Diabetes booklet had been ordered and once he receives it encouraged him to read the entire book to enhance DM knowledge. Patient verbalized understanding of  information discussed and he states that he has no further questions at this time related to diabetes.  Thanks, Orlando Penner, RN, MSN, CDE Diabetes Coordinator Inpatient Diabetes Program [phone] (Team Pager from 8am to 5pm)

## 2017-04-12 NOTE — Clinical Social Work Note (Signed)
Patient referred to CSW for access to medications. This is not a CSW function. This is a CM function. LCSW to notify CM of patient's need.   LCSW signing off.     Alyx Gee, Juleen ChinaHeather D, LCSW

## 2017-04-12 NOTE — Progress Notes (Addendum)
Initial Nutrition Assessment  DOCUMENTATION CODES:  Not applicable  INTERVENTION:  Will order 30 mL Prostat BID, each supplement provides 100 kcal and 15 grams of protein.  MVI with minerals   DM education given w/ handouts "Type 2 Diabetes Nutrition Therapy" , "Diabetes label reading" and a copy of the DM My Plate  Summary of recommendations: 1. No subway bread or fast food biscuits  2. Dont skip meals 3. Never eat a carb alone. Pair with veg/protein. Plate method  NUTRITION DIAGNOSIS:  Increased nutrient needs related to wound healing as evidenced by estimated nutritional requirements for this outcome  GOAL:  Patient will meet greater than or equal to 90% of their needs + Improvement in BG/Glycemic control  MONITOR:  PO intake, Supplement acceptance, Diet advancement, Labs, Weight trends, Skin (Education needs)  REASON FOR ASSESSMENT:  Consult Wound healing + Diet education  ASSESSMENT:  51 y/o male PMHx many prior toe amputations, HTN, Uncontrolled DM, charcot feet, DDD. Presents from outpatient visit at podiatry for evaluation of his Left foot. RD consulted for wound healing and DM diet ed.   RD provided "Type 2 Diabetes Nutrition Therapy" and "Diabetes Label Reading" handouts from the Academy of Nutrition and Dietetics.  Diet recall Breakfast: Biscuit (bacon/egg/cheese) Lunch: Subway sub, McDonald mcnuggets. Sometimes salad with fat free ranch Sara LeeDinner. Typically skips. Or eats left over of sandwich Bevs: Water, 0 kcal powerade.  Foods he eats "only once in a while": sweet tea, ice cream, chips, fries, juice, lemonade,   Patient gets the majority of his foods from fast food restaurants. We discussed why this is a problem. Portion size is typically much larger and the foods are typically very dense in carbs.  Also, it is much more difficult to know the nutrition information of the foods he is eating. Educated that the subway bread, the biscuits and the chicken nugget  breading are all very high in carbs. He thought his subs were okay because they were wheat.The salad was his only meal that sounded appropriate. He was very hesitant to agree to cut back on fast food. Whenever RD stated something wasn't good he would say he only ate a small amount or ate it infrequently.   He is skipping meals. RD stated why this is bad and how it can lead to erratic BG. He should always eat something, especially given the fact he is on insulin.   He sounds to eat carbs alone frequently. He mentioned eating a large quantity of fruit alone. We discussed importance of pairing his carbs with veg/protein as this will decrease rise in BG.   He does not read labels. RD showed him a label and pointed out pertinent aspects ie he wants protein/fiber to be high and sugar/total carbs to be low.    Provided examples of ways to balance meals/snacks and encouraged intake of high-fiber, whole grain complex carbohydrates.   He is not checking his BG. Strongly urged to do this as it can be a report card for how he is eating. If it is >250 he needs to look at what he had eaten. Though may be difficult to treat with infection.  Expect poor-fair compliance. Education significantly limited by the patients interjections and poor health literacy. He said "only now and again" to about a dozen high carb foods. Suspect he is eating high carb foods actually quite often, just different ones.   Current diet order is Carb Modified.  patient is consuming approximately 75-100% of meals at this time.  Will add MVI and Prostat to cover all bases.   Labs: New a1c pending. A1C on 4/5 was 10.8. Current BGs 180-250 Meds: Insulin, Colace, IV abx, IVF,    Recent Labs Lab 04/11/17 1644 04/12/17 0458  NA 130* 135  K 3.5 3.5  CL 95* 98*  CO2 25 29  BUN 12 10  CREATININE 0.75 0.75  CALCIUM 8.4* 8.7*  GLUCOSE 303* 220*   Diet Order:  Diet Carb Modified Fluid consistency: Thin; Room service appropriate?  Yes  Skin: Weeping bilateral feet, Diabetic uclerc to both feet. L worse than right.   Last BM:  6/6  Height:  Ht Readings from Last 1 Encounters:  04/11/17 6\' 4"  (1.93 m)   Weight:  Wt Readings from Last 1 Encounters:  04/11/17 201 lb 8 oz (91.4 kg)   Wt Readings from Last 10 Encounters:  04/11/17 201 lb 8 oz (91.4 kg)  02/08/17 213 lb (96.6 kg)  02/08/17 218 lb 9.6 oz (99.2 kg)  01/11/17 219 lb 9.6 oz (99.6 kg)  01/08/17 218 lb 9.6 oz (99.2 kg)  11/24/16 219 lb (99.3 kg)  10/05/16 218 lb (98.9 kg)  10/02/16 219 lb (99.3 kg)  09/20/16 226 lb (102.5 kg)  08/10/16 210 lb 6.4 oz (95.4 kg)   Ideal Body Weight:  91.82 kg  BMI:  Body mass index is 24.53 kg/m.  Estimated Nutritional Needs:  Kcal:  2300-2450 (25-26 kcal/kg bw) Protein:  90-100 g (1-1.1 g/kg bw) Fluid:  2>2.3 L (25 ml/kg bw)  EDUCATION NEEDS:  Education needs addressed  Christophe Louis RD, LDN, CNSC Clinical Nutrition Pager: [phone] 04/12/2017 4:04 PM

## 2017-04-12 NOTE — Progress Notes (Signed)
PROGRESS NOTE    [name]  BJY:[id number] DOB: December 17, 1965 DOA: 04/11/2017 PCP: Johna SheriffVincent, Carol L, MD    Brief Narrative: Patient with HTN, DM, charcot ankle joints, admitted for cellulitis, sent in by his podiatrist as clinda did not help.  ABI was negative.  MRI did not show any definite osteomyelitis.  BS has been in the 200's.  He thinks the redness is a little better.    Assessment & Plan:   Principal Problem:   Diabetic foot infection (HCC) Active Problems:   Essential hypertension, benign   Charcot ankle   Type 2 diabetes mellitus (HCC)   Cellulitis and abscess of foot  1. Diabetic foot ulcer:   He had temperature, but no leukocytosis, and negative MRI for osteo.  Will continue with IV antibiotic today, and plan to discharge him home tomorrow.   2. DM:  Will adjust insulin up.  Metformin has been held.   Carb modified diet.  3. HTN: Continue with meds.  BP controlled.     DVT prophylaxis: Lovenox.  Code Status: FULL CODE.  Family Communication: none at bedside.  Disposition Plan: Home   Consultants:   None.   Procedures:   None.   Antimicrobials: Anti-infectives    Start     Dose/Rate Route Frequency Ordered Stop   04/12/17 0000  vancomycin (VANCOCIN) IVPB 1000 mg/200 mL premix     1,000 mg 200 mL/hr over 60 Minutes Intravenous Every 8 hours 04/11/17 2125     04/11/17 2300  metroNIDAZOLE (FLAGYL) IVPB 500 mg     500 mg 100 mL/hr over 60 Minutes Intravenous Every 8 hours 04/11/17 2110     04/11/17 2200  cefTRIAXone (ROCEPHIN) 2 g in dextrose 5 % 50 mL IVPB     2 g 100 mL/hr over 30 Minutes Intravenous Every 24 hours 04/11/17 2110     04/11/17 1600  vancomycin (VANCOCIN) IVPB 1000 mg/200 mL premix     1,000 mg 200 mL/hr over 60 Minutes Intravenous  Once 04/11/17 1557 04/11/17 1746       Subjective:   No complaints.   Objective: Vitals:   04/11/17 2026 04/11/17 2102 04/12/17 0508 04/12/17 1011  BP: 140/85 137/69 125/69 119/61  Pulse: 79 77 80 81    Resp: 16 16 16 18   Temp:  97.8 F (36.6 C) (!) 101 F (38.3 C)   TempSrc:  Oral Oral   SpO2: 99% 100% 97% 98%  Weight:  91.4 kg (201 lb 8 oz)    Height:  6\' 4"  (1.93 m)      Intake/Output Summary (Last 24 hours) at 04/12/17 1702 Last data filed at 04/12/17 1701  Gross per 24 hour  Intake             2635 ml  Output              500 ml  Net             2135 ml   Filed Weights   04/11/17 1440 04/11/17 2102  Weight: 96.2 kg (212 lb) 91.4 kg (201 lb 8 oz)    Examination:  General exam: Appears calm and comfortable  Respiratory system: Clear to auscultation. Respiratory effort normal. Cardiovascular system: S1 & S2 heard, RRR. No JVD, murmurs, rubs, gallops or clicks. No pedal edema. Gastrointestinal system: Abdomen is nondistended, soft and nontender. No organomegaly or masses felt. Normal bowel sounds heard. Central nervous system: Alert and oriented. No focal neurological deficits. Extremities: Symmetric 5 x  5 power. Skin: ulcer on both feet.  Dry and clean.  Slight erythema of the right dorsal foot.  Psychiatry: Judgement and insight appear normal. Mood & affect appropriate.   Data Reviewed: I have personally reviewed following labs and imaging studies  CBC:  Recent Labs Lab 04/11/17 1644 04/12/17 0458  WBC 9.4 7.6  NEUTROABS 7.8*  --   HGB 12.5* 12.3*  HCT 36.2* 36.8*  MCV 87.2 88.7  PLT 234 258   Basic Metabolic Panel:  Recent Labs Lab 04/11/17 1644 04/12/17 0458  NA 130* 135  K 3.5 3.5  CL 95* 98*  CO2 25 29  GLUCOSE 303* 220*  BUN 12 10  CREATININE 0.75 0.75  CALCIUM 8.4* 8.7*    Recent Labs Lab 04/11/17 2132 04/12/17 0750 04/12/17 1133 04/12/17 1626  GLUCAP 179* 182* 245* 186*    Recent Results (from the past 240 hour(s))  Aerobic Culture (superficial specimen)     Status: None (Preliminary result)   Collection Time: 04/11/17  3:57 PM  Result Value Ref Range Status   Specimen Description FOOT LEFT  Final   Special Requests NONE   Final   Gram Stain   Final    ABUNDANT WBC PRESENT, PREDOMINANTLY MONONUCLEAR RARE GRAM POSITIVE COCCI IN PAIRS    Culture   Final    CULTURE REINCUBATED FOR BETTER GROWTH Performed at [facility], [address] [place], [place] [address]    Report Status PENDING  Incomplete  Blood Cultures x 2 sites     Status: None (Preliminary result)   Collection Time: 04/11/17  9:12 PM  Result Value Ref Range Status   Specimen Description RIGHT ANTECUBITAL  Final   Special Requests   Final    BOTTLES DRAWN AEROBIC AND ANAEROBIC Blood Culture results may not be optimal due to an inadequate volume of blood received in culture bottles   Culture NO GROWTH < 12 HOURS  Final   Report Status PENDING  Incomplete  Blood Cultures x 2 sites     Status: None (Preliminary result)   Collection Time: 04/11/17  9:12 PM  Result Value Ref Range Status   Specimen Description BLOOD RIGHT HAND  Final   Special Requests   Final    BOTTLES DRAWN AEROBIC AND ANAEROBIC Blood Culture results may not be optimal due to an inadequate volume of blood received in culture bottles   Culture NO GROWTH < 12 HOURS  Final   Report Status PENDING  Incomplete     Radiology Studies: [name]  Result Date: 04/11/2017 CLINICAL DATA:  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 Foot ulcer with fat varix post and current clinical exam. Patient has been on clindamycin. EXAM: MRI OF THE LEFT FOREFOOT WITHOUT AND WITH CONTRAST TECHNIQUE: Multiplanar, multisequence MR imaging of the right forefoot was performed both before and after administration of intravenous contrast. CONTRAST:  20mL MULTIHANCE GADOBENATE DIMEGLUMINE 529 MG/ML IV SOLN COMPARISON:  02/08/2017 FINDINGS: Bones/Joint/Cartilage Interval diffuse reactive marrow edema of the medial cuneiform and first metatarsal base through metatarsal neck as well as across the second TMT joint involving the middle cuneiform and base of second metatarsal secondary to  chronic plantar soft tissue ulceration and inflammation. Chronic sub cortical cystic change is again noted along the plantar aspect of the first metatarsal base with minimal osteophytosis. No frank bone destruction is apparent. Ligaments Intact Muscles and Tendons No intramuscular fluid collections. Mild intrinsic muscle atrophy of the forefoot. Soft tissues Diffuse  inflammatory change about medial aspect of the forefoot with soft tissue ulceration along the plantar and medial aspect of the first metatarsal and medial cuneiform. IMPRESSION: 1. Prominent soft tissue ulceration along the plantar medial aspect of the forefoot adjacent to the first TMT articulation with inflammatory soft tissue edema consistent with cellulitis. 2. Diffuse pattern of marrow edema noted of the medial cuneiform and first metatarsal and to a lesser degree the middle cuneiform and base of second metatarsal in a pattern suggesting reactive marrow edema. More focal subcortical cystic change at the base of the medial cuneiform and first metatarsal are believed to be secondary to pre-existing osteoarthritis and less likely due to changes of an early osteomyelitis. No definite evidence of septic arthritis. Electronically Signed   By: Tollie Eth M.D.   On: 04/11/2017 18:56   US Arterial Seg Single  Result Date: 04/12/2017 CLINICAL DATA:  51 year old male with a diabetic foot infection. Evaluate for hemodynamically significant peripheral arterial disease. EXAM: NONINVASIVE PHYSIOLOGIC VASCULAR STUDY OF BILATERAL LOWER EXTREMITIES TECHNIQUE: Evaluation of both lower extremities was performed at rest, including calculation of ankle-brachial indices, multiple segmental pressure evaluation, segmental Doppler and segmental pulse volume recording. COMPARISON:  MRI of the left foot 04/11/2017 FINDINGS: Right ABI:  1.3 Left ABI:  1.3 Right Lower Extremity: Normal triphasic arterial waveforms at the ankle. Left Lower Extremity: Minimally abnormal  biphasic arterial waveforms without significant dampening. IMPRESSION: Normal bilateral resting ankle-brachial indices and normal to minimally abnormal arterial waveforms. Signed, Sterling Big, MD [facility] Electronically Signed   By: Malachy Moan M.D.   On: 04/12/2017 09:21   Dg Foot Complete Left  Result Date: 04/11/2017 CLINICAL DATA:  Soft tissue ulcers of the left foot. 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 There is new soft tissue swelling over that joint. There is no bone destruction. The patient has a flatfoot deformity. Ulceration on the plantar aspect of the midfoot is noted there is no evidence of adjacent bone destruction. Severe arthritic changes of the ankle joint. IMPRESSION: 1. No findings of osteomyelitis. 2. Marked degenerative changes in the midfoot and ankle with a flatfoot deformity. 3. Soft tissue swelling over the first tarsal metatarsal joint. Electronically Signed   By: Francene Boyers M.D.   On: 04/11/2017 16:50    Scheduled Meds: . aspirin EC  81 mg Oral Daily  . docusate sodium  100 mg Oral BID  . feeding supplement (PRO-STAT SUGAR FREE 64)  30 mL Oral BID  . insulin aspart  0-15 Units Subcutaneous TID WC  . insulin aspart  0-5 Units Subcutaneous QHS  . insulin glargine  30 Units Subcutaneous QHS  . lisinopril  5 mg Oral Daily  . multivitamin with minerals  1 tablet Oral Daily   Continuous Infusions: . cefTRIAXone (ROCEPHIN)  IV Stopped (04/11/17 2338)   And  . metronidazole 500 mg (04/12/17 1644)  . lactated ringers Stopped (04/12/17 1644)  . vancomycin 1,000 mg (04/12/17 1654)     LOS: 1 day   Damaree Sargent, MD FACP Hospitalist.   If 7PM-7AM, please contact night-coverage [email or web address] Password [email or web address] 04/12/2017, 5:02 PM

## 2017-04-13 LAB — BASIC METABOLIC PANEL
ANION GAP: 9 (ref 5–15)
BUN: 11 mg/dL (ref 6–20)
CHLORIDE: 98 mmol/L — AB (ref 101–111)
CO2: 27 mmol/L (ref 22–32)
Calcium: 8.5 mg/dL — ABNORMAL LOW (ref 8.9–10.3)
Creatinine, Ser: 0.82 mg/dL (ref 0.61–1.24)
GFR calc Af Amer: >60 mL/min (ref >60)
GFR calc non Af Amer: >60 mL/min (ref >60)
Glucose, Bld: 188 mg/dL — ABNORMAL HIGH (ref 65–99)
Potassium: 3.8 mmol/L (ref 3.5–5.1)
Sodium: 134 mmol/L — ABNORMAL LOW (ref 135–145)

## 2017-04-13 LAB — GLUCOSE, CAPILLARY
GLUCOSE-CAPILLARY: 174 mg/dL — AB (ref 65–99)
Glucose-Capillary: 198 mg/dL — ABNORMAL HIGH (ref 65–99)

## 2017-04-13 LAB — HEMOGLOBIN A1C
Hgb A1c MFr Bld: 9.8 % — ABNORMAL HIGH (ref 4.8–5.6)
Mean Plasma Glucose: 235 mg/dL

## 2017-04-13 LAB — HIV ANTIBODY (ROUTINE TESTING W REFLEX): HIV SCREEN 4TH GENERATION: NONREACTIVE

## 2017-04-13 MED ORDER — SULFAMETHOXAZOLE-TRIMETHOPRIM 400-80 MG PO TABS: 1.0000 | ORAL_TABLET | Freq: Two times a day (BID) | ORAL | 0 refills | Status: DC

## 2017-04-13 MED ORDER — BLOOD GLUCOSE MONITOR KIT: PACK | 0 refills | Status: DC

## 2017-04-13 MED ORDER — INSULIN PEN NEEDLE 32G X 4 MM MISC: 3 refills | Status: DC

## 2017-04-13 NOTE — Care Management Note (Signed)
Case Management Note  Patient Details  Name: France RavensKeith B Loyd MRN: [id number] Date of Birth: [birth year]  Subjective/Objective:                  Admitted with food infection. Pt from home, lives with parents. Reports dressing wound himself, not being homebound and [facility]. Pt reports need for new diabetic supplies including meter and lancets.   Action/Plan: Pt plans to return home with self care. Possibly today. MD asked to provide Rx for diabetic supplies.   Expected Discharge Date:     04/13/2017             Expected Discharge Plan:  Home/Self Care  In-House Referral:  NA  Discharge planning Services  CM Consult  Post Acute Care Choice:  NA Choice offered to:  NA  Status of Service:  Completed, signed off  Malcolm MetroChildress, Johonna Binette Demske, RN 04/13/2017, 10:50 AM

## 2017-04-13 NOTE — Discharge Summary (Signed)
Physician Discharge Summary  [name]:[id number] DOB: 1966-04-14 DOA: 04/11/2017  PCP: Anthony Maize, MD  Admit date: 04/11/2017 Discharge date: 04/13/2017  Admitted From: Home.  Disposition: Home.   Recommendations for Outpatient Follow-up:  1. Follow up with PCP in 1-2 weeks 2. Follow up with Podiatry as scheduled.   Home Health: No.  Equipment/Devices: CAM walker.  Discharge Condition: Improved.  CODE STATUS: FULL CODE.  Diet recommendation: Carb modified diet.   Brief/Interim Summary: Patient was admitted for cellulitis by Dr [name] on April 11, 2017.  As per her H and P:   " [name] is a 51 y.o. male with medical history significant of prior amputations, HTN, DM, and Charcot feet presenting with chronic left foot ulcer which appears to be worsening.  He reports that it started with a small cyst.  He says a doctor at [name] popped it and sent him to podiatry.  The podiatrist on call cut on it a little bit and since then Dr. Caprice [name] has been treating it.  Last Wednesday he noticed redness.  He was started on PO Clindamycin on Thursday.  It started really bothering him Tuesday - his leg was getting numb, more red, draining watery discharge.  Prior right toe amputation, similar presentation.  No fevers, feels fine otherwise.  He saw Dr. Caprice [name] today, who gave him a CAM walker and sent him to the ER.    Letter today from Dr. Caprice [name] at [facility]: "The patient was seen in my office today.  He suffers from chronic ulcerations of both feet.  The right foot appears stable.  The ulceration of the left foot probes to bone with periwound erythema.  He has been on  clindaymcin 300 mg TID.  I have instructed the patient to go to emergency room for evaluation, admission, and IV antibiotics."   ED Course:  B foot ulcers with Charcot foot and worsening erythema of the left foot despite 1 week of PO antibiotics and failure of outpatient management.  Given Vanc and  sent for plain films (negative for osteomyelitis) and MRI.  ESR 64.  Stable hyponatremia.  Anemia worse than prior.  HOSPITAL COURSE: Patient with HTN, DM, charcot ankle joints, admitted for cellulitis, sent in by his podiatrist as clinda did not help.  ABI was negative.  MRI did not show any definite osteomyelitis.  BS has been in the 200's.  He thinks the redness is a little better. There is no leukocytosis, fever or chills.  He will be discharged home today on Bactrim, and follow up with his PCP and Podiatrist.  He was recommended to use the CAM walker, and stay off his feet as per instructions from podiatry.   Discharge Diagnoses:  Principal Problem:   Diabetic foot infection ([place]) Active Problems:   Essential hypertension, benign   Charcot ankle   Type 2 diabetes mellitus (HCC)   Cellulitis and abscess of foot    Discharge Instructions   Allergies as of 04/13/2017      Reactions   Penicillins Other (See Comments)   Has patient had a PCN reaction causing immediate rash, facial/tongue/throat swelling, SOB or lightheadedness with hypotension: No Has patient had a PCN reaction causing severe rash involving mucus membranes or skin necrosis: No Has patient had a PCN reaction that required hospitalization No Has patient had a PCN reaction occurring within the last 10 years: No If all of the above answers are "NO", then may proceed with Cephalosporin  use.      Medication List    STOP taking these medications   clindamycin 300 MG capsule Commonly known as:  CLEOCIN     TAKE these medications   aspirin EC 81 MG EC tablet Generic drug:  aspirin Take 81 mg by mouth daily. Swallow whole.   BASAGLAR KWIKPEN 100 UNIT/ML Sopn Inject 0.25-0.35 mLs (25-35 Units total) into the skin at bedtime. What changed:  how much to take   blood glucose meter kit and supplies Kit Dispense based on patient and insurance preference. Use up to four times daily as directed. (FOR ICD-9 250.00, 250.01).    gabapentin 600 MG tablet Commonly known as:  NEURONTIN Take 1 tablet (600 mg total) by mouth at bedtime.   glucose blood test strip Commonly known as:  BAYER CONTOUR TEST TEST BLOOD SUGAR UP TO TWICE DAILY   HYDROcodone-acetaminophen 10-325 MG tablet Commonly known as:  NORCO Take 1 tablet by mouth every 6 (six) hours as needed for moderate pain.   Insulin Pen Needle 32G X 4 MM Misc Commonly known as:  BD PEN NEEDLE NANO U/F USE WITH INSULIN ONCE DAILY   lisinopril 5 MG tablet Commonly known as:  PRINIVIL,ZESTRIL Take 1 tablet (5 mg total) by mouth daily.   metFORMIN 1000 MG tablet Commonly known as:  GLUCOPHAGE Take 1 tablet (1,000 mg total) by mouth 2 (two) times daily.   sulfamethoxazole-trimethoprim 400-80 MG tablet Commonly known as:  BACTRIM Take 1 tablet by mouth 2 (two) times daily.       Allergies  Allergen Reactions  . Penicillins Other (See Comments)    Has patient had a PCN reaction causing immediate rash, facial/tongue/throat swelling, SOB or lightheadedness with hypotension: No Has patient had a PCN reaction causing severe rash involving mucus membranes or skin necrosis: No Has patient had a PCN reaction that required hospitalization No Has patient had a PCN reaction occurring within the last 10 years: No If all of the above answers are "NO", then may proceed with Cephalosporin use.     Consultations:  Podiatry.    Procedures/Studies: [name]  Result Date: 04/11/2017 CLINICAL DATA:  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 Foot ulcer with fat varix post and current clinical exam. Patient has been on clindamycin. EXAM: MRI OF THE LEFT FOREFOOT WITHOUT AND WITH CONTRAST TECHNIQUE: Multiplanar, multisequence MR imaging of the right forefoot was performed both before and after administration of intravenous contrast. CONTRAST:  76m MULTIHANCE GADOBENATE DIMEGLUMINE 529 MG/ML IV SOLN COMPARISON:  02/08/2017 FINDINGS:  Bones/Joint/Cartilage Interval diffuse reactive marrow edema of the medial cuneiform and first metatarsal base through metatarsal neck as well as across the second TMT joint involving the middle cuneiform and base of second metatarsal secondary to chronic plantar soft tissue ulceration and inflammation. Chronic sub cortical cystic change is again noted along the plantar aspect of the first metatarsal base with minimal osteophytosis. No frank bone destruction is apparent. Ligaments Intact Muscles and Tendons No intramuscular fluid collections. Mild intrinsic muscle atrophy of the forefoot. Soft tissues Diffuse inflammatory change about medial aspect of the forefoot with soft tissue ulceration along the plantar and medial aspect of the first metatarsal and medial cuneiform. IMPRESSION: 1. Prominent soft tissue ulceration along the plantar medial aspect of the forefoot adjacent to the first TMT articulation with inflammatory soft tissue edema consistent with cellulitis. 2. Diffuse pattern of marrow edema noted of the medial cuneiform and first metatarsal and to a lesser degree the  middle cuneiform and base of second metatarsal in a pattern suggesting reactive marrow edema. More focal subcortical cystic change at the base of the medial cuneiform and first metatarsal are believed to be secondary to pre-existing osteoarthritis and less likely due to changes of an early osteomyelitis. No definite evidence of septic arthritis. Electronically Signed   By: Ashley Royalty M.D.   On: 04/11/2017 18:56   US Arterial Seg Single  Result Date: 04/12/2017 CLINICAL DATA:  51 year old male with a diabetic foot infection. Evaluate for hemodynamically significant peripheral arterial disease. EXAM: NONINVASIVE PHYSIOLOGIC VASCULAR STUDY OF BILATERAL LOWER EXTREMITIES TECHNIQUE: Evaluation of both lower extremities was performed at rest, including calculation of ankle-brachial indices, multiple segmental pressure evaluation, segmental  Doppler and segmental pulse volume recording. COMPARISON:  MRI of the left foot 04/11/2017 FINDINGS: Right ABI:  1.3 Left ABI:  1.3 Right Lower Extremity: Normal triphasic arterial waveforms at the ankle. Left Lower Extremity: Minimally abnormal biphasic arterial waveforms without significant dampening. IMPRESSION: Normal bilateral resting ankle-brachial indices and normal to minimally abnormal arterial waveforms. Signed, Criselda Peaches, MD [facility] Electronically Signed   By: Jacqulynn Cadet M.D.   On: 04/12/2017 09:21   Dg Foot Complete Left  Result Date: 04/11/2017 CLINICAL DATA:  Soft tissue ulcers of the left foot. 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 There is new soft tissue swelling over that joint. There is no bone destruction. The patient has a flatfoot deformity. Ulceration on the plantar aspect of the midfoot is noted there is no evidence of adjacent bone destruction. Severe arthritic changes of the ankle joint. IMPRESSION: 1. No findings of osteomyelitis. 2. Marked degenerative changes in the midfoot and ankle with a flatfoot deformity. 3. Soft tissue swelling over the first tarsal metatarsal joint. Electronically Signed   By: Lorriane Shire M.D.   On: 04/11/2017 16:50     Subjective: My foot felt better.   Discharge Exam: Vitals:   04/12/17 2036 04/13/17 0615  BP: 117/65 127/73  Pulse: 86 76  Resp: 18   Temp: 99.5 F (37.5 C) 98.5 F (36.9 C)   Vitals:   04/12/17 0508 04/12/17 1011 04/12/17 2036 04/13/17 0615  BP: 125/69 119/61 117/65 127/73  Pulse: 80 81 86 76  Resp: _0 Temp: (!) 101 F (38.3 C)  99.5 F (37.5 C) 98.5 F (36.9 C)  TempSrc: Oral  Oral Oral  SpO2: 97% 98% 99% 99%  Weight:      Height:        General: Pt is alert, awake, not in acute distress Cardiovascular: RRR, S1/S2  +, no rubs, no gallops Respiratory: CTA bilaterally, no wheezing, no rhonchi Abdominal: Soft, NT, ND, bowel sounds + Extremities: no edema, no cyanosis    The results of significant diagnostics from this hospitalization (including imaging, microbiology, ancillary and laboratory) are listed below for reference.     Microbiology: Recent Results (from the past 240 hour(s))  Aerobic Culture (superficial specimen)     Status: None (Preliminary result)   Collection Time: 04/11/17  3:57 PM  Result Value Ref Range Status   Specimen Description FOOT LEFT  Final   Special Requests NONE  Final   Gram Stain   Final    ABUNDANT WBC PRESENT, PREDOMINANTLY MONONUCLEAR RARE GRAM POSITIVE COCCI IN PAIRS    Culture   Final    FEW STAPHYLOCOCCUS AUREUS  SUSCEPTIBILITIES TO FOLLOW Performed at [facility], [place] [address] [place], [place] [address]    Report Status PENDING  Incomplete  Blood Cultures x 2 sites     Status: None (Preliminary result)   Collection Time: 04/11/17  9:12 PM  Result Value Ref Range Status   Specimen Description RIGHT ANTECUBITAL  Final   Special Requests   Final    BOTTLES DRAWN AEROBIC AND ANAEROBIC Blood Culture results may not be optimal due to an inadequate volume of blood received in culture bottles   Culture NO GROWTH 2 DAYS  Final   Report Status PENDING  Incomplete  Blood Cultures x 2 sites     Status: None (Preliminary result)   Collection Time: 04/11/17  9:12 PM  Result Value Ref Range Status   Specimen Description BLOOD RIGHT HAND  Final   Special Requests   Final    BOTTLES DRAWN AEROBIC AND ANAEROBIC Blood Culture results may not be optimal due to an inadequate volume of blood received in culture bottles   Culture NO GROWTH 2 DAYS  Final   Report Status PENDING  Incomplete     Labs: BNP (last 3 results) No results for input(s): BNP in the last 8760 hours. Basic Metabolic Panel:  Recent Labs Lab 04/11/17 1644 04/12/17 0458 04/13/17 0446   NA 130* 135 134*  K 3.5 3.5 3.8  CL 95* 98* 98*  CO2 _0 GLUCOSE 303* 220* 188*  BUN _1 CREATININE 0.75 0.75 0.82  CALCIUM 8.4* 8.7* 8.5*   CBC:  Recent Labs Lab 04/11/17 1644 04/12/17 0458  WBC 9.4 7.6  NEUTROABS 7.8*  --   HGB 12.5* 12.3*  HCT 36.2* 36.8*  MCV 87.2 88.7  PLT 234 258   CBG:  Recent Labs Lab 04/12/17 0750 04/12/17 1133 04/12/17 1626 04/13/17 0800 04/13/17 1120  GLUCAP 182* 245* 186* 174* 198*   Hgb A1c  Recent Labs  04/11/17 2112  HGBA1C 9.8*   Urinalysis    Component Value Date/Time   COLORURINE YELLOW 06/13/2014 1048   APPEARANCEUR CLEAR 06/13/2014 1048   LABSPEC 1.020 06/13/2014 1048   PHURINE 6.0 06/13/2014 1048   GLUCOSEU >1000 (A) 06/13/2014 1048   HGBUR NEGATIVE 06/13/2014 1048   BILIRUBINUR NEGATIVE 06/13/2014 1048   BILIRUBINUR negative 03/26/2014 1212   KETONESUR NEGATIVE 06/13/2014 1048   PROTEINUR NEGATIVE 06/13/2014 1048   UROBILINOGEN 0.2 06/13/2014 1048   NITRITE NEGATIVE 06/13/2014 1048   LEUKOCYTESUR NEGATIVE 06/13/2014 1048   Microbiology Recent Results (from the past 240 hour(s))  Aerobic Culture (superficial specimen)     Status: None (Preliminary result)   Collection Time: 04/11/17  3:57 PM  Result Value Ref Range Status   Specimen Description FOOT LEFT  Final   Special Requests NONE  Final   Gram Stain   Final    ABUNDANT WBC PRESENT, PREDOMINANTLY MONONUCLEAR RARE GRAM POSITIVE COCCI IN PAIRS    Culture   Final    FEW STAPHYLOCOCCUS AUREUS SUSCEPTIBILITIES TO FOLLOW Performed at [facility], [place] [address] [place], [place] [address]    Report Status PENDING  Incomplete  Blood Cultures x 2 sites     Status: None (Preliminary result)   Collection Time: 04/11/17  9:12 PM  Result Value Ref Range Status   Specimen Description RIGHT ANTECUBITAL  Final   Special Requests   Final    BOTTLES DRAWN AEROBIC AND ANAEROBIC Blood Culture results may not be optimal due to  an inadequate  volume of blood received in culture bottles   Culture NO GROWTH 2 DAYS  Final   Report Status PENDING  Incomplete  Blood Cultures x 2 sites     Status: None (Preliminary result)   Collection Time: 04/11/17  9:12 PM  Result Value Ref Range Status   Specimen Description BLOOD RIGHT HAND  Final   Special Requests   Final    BOTTLES DRAWN AEROBIC AND ANAEROBIC Blood Culture results may not be optimal due to an inadequate volume of blood received in culture bottles   Culture NO GROWTH 2 DAYS  Final   Report Status PENDING  Incomplete    Time coordinating discharge: Over 30 minutes SIGNED:  Orvan Falconer, MD FACP [facility] 04/13/2017, 11:23 AM   If 7PM-7AM, please contact night-coverage [email or web address] Password [email or web address]

## 2017-04-13 NOTE — Progress Notes (Signed)
Patient discharged home with personal belongings. IV removed and site intact. Pt sent home with all prescriptions.

## 2017-04-13 NOTE — Progress Notes (Signed)
Inpatient Diabetes Program Recommendations  AACE/ADA: New Consensus Statement on Inpatient Glycemic Control (2015)  Target Ranges:  Prepandial:   less than 140 mg/dL      Peak postprandial:   less than 180 mg/dL (1-2 hours)      Critically ill patients:  140 - 180 mg/dL  Results for [name], [name] (MRN [id number]) as of 04/13/2017 08:07  Ref. Range 04/11/2017 21:32 04/12/2017 07:50 04/12/2017 11:33 04/12/2017 16:26 04/13/2017 08:00  Glucose-Capillary Latest Ref Range: 65 - 99 mg/dL 782179 (H) 956182 (H) 213245 (H) 186 (H) 174 (H)   Results for [name], [name] (MRN [id number]) as of 04/13/2017 08:07  Ref. Range 02/08/2017 12:34 04/11/2017 21:12  Hemoglobin A1C Latest Ref Range: 4.8 - 5.6 % 10.8 (H) 9.8 (H)   Review of Glycemic Control  Diabetes history: DM2 Outpatient Diabetes medications: Basaglar 28-30 units QHS, Metformin 1000 mg BID Current orders for Inpatient glycemic control: Lantus 30 QHS, Novolog 0-15 units TID with meals, Novolog 0-5 units QHS  Inpatient Diabetes Program Recommendations: Insulin -Meal Coverage: Please consider ordering Novolog 5 units TID with meals for meal coverage.  HgbA1C: A1C 9.8% on 04/13/17 indicating an average glucose of 235 mg/dl.   Thanks, Orlando PennerMarie Raydan Schlabach, RN, MSN, CDE Diabetes Coordinator Inpatient Diabetes Program [phone] (Team Pager from 8am to 5pm)

## 2017-04-15 LAB — AEROBIC CULTURE W GRAM STAIN (SUPERFICIAL SPECIMEN)

## 2017-04-15 LAB — AEROBIC CULTURE  (SUPERFICIAL SPECIMEN)

## 2017-04-16 LAB — CULTURE, BLOOD (ROUTINE X 2)
CULTURE: NO GROWTH
Culture: NO GROWTH

## 2017-05-04 ENCOUNTER — Ambulatory Visit (INDEPENDENT_AMBULATORY_CARE_PROVIDER_SITE_OTHER): Payer: BLUE CROSS/BLUE SHIELD | Admitting: Family Medicine

## 2017-05-04 ENCOUNTER — Encounter: Payer: Self-pay | Admitting: Family Medicine

## 2017-05-04 VITALS — BP 134/76 | HR 104 | Temp 97.0°F | Ht 76.0 in | Wt 190.2 lb

## 2017-05-04 DIAGNOSIS — Z0289 Encounter for other administrative examinations: Secondary | ICD-10-CM | POA: Diagnosis not present

## 2017-05-04 DIAGNOSIS — G8929 Other chronic pain: Secondary | ICD-10-CM | POA: Diagnosis not present

## 2017-05-04 DIAGNOSIS — M79673 Pain in unspecified foot: Secondary | ICD-10-CM

## 2017-05-04 MED ORDER — HYDROCODONE-ACETAMINOPHEN 10-325 MG PO TABS: 1.0000 | ORAL_TABLET | Freq: Four times a day (QID) | ORAL | 0 refills | Status: DC | PRN

## 2017-05-04 NOTE — Patient Instructions (Signed)
Great to meet you!  Come back in 3 months unless you need us sooner.    

## 2017-05-04 NOTE — Progress Notes (Signed)
   HPI  Patient presents today here for follow-up chronic pain.  Patient has Charcot joint and chronic foot ulcers, he's had a recent bout with active cellulitis and has a follow-up with podiatry already scheduled. He is doing well doxycycline but does have increased pain. He states that his left foot pain is helped quite a bit by [employer] Walker.  He states he takes hydrocodone very judiciously. He is very careful with his dosing. He dates that he does not take any other controlled substances or or illegal drugs.  PMH: Smoking status noted ROS: Per HPI  Objective: BP 134/76   Pulse (!) 104   Temp 97 F (36.1 C) (Oral)   Ht 6\' 4"  (1.93 m)   Wt 190 lb 3.2 oz (86.3 kg)   BMI 23.15 kg/m  Gen: NAD, alert, cooperative with exam HEENT: NCAT CV: RRR, good S1/S2, no murmur Resp: CTABL, no wheezes, non-labored Abd: SNTND, BS present, no guarding or organomegaly Ext: Interned and right ankle, Cam Walker present on left foot Neuro: Alert and oriented, No gross deficits  Assessment and plan:  # Chronic foot pain, pain management agreement signed Pain managed well with intermittent use of hydrocodone, refilled #60 today. Pain seems to be multifactorial with severe diabetic neuropathy, chronic ulcers, recently active cellulitis, and Charcot joint Discussed expectations around controlled substance agreements, he feels very comfortable and does not want to review the contract again. [place] controlled substance database was reviewed with no red flags UDS collected  His custom importance of good diabetic control, he is too early for repeat A1c, he had minimal improvement between previous A1c's.   Orders Placed This Encounter  Procedures  . ToxASSURE Select 13 (MW), Urine    Meds ordered this encounter  Medications  . doxycycline (VIBRA-TABS) 100 MG tablet    Sig: 1 TABLET 2 TIMES A DAY FOR 10 DAYS    Refill:  0  . HYDROcodone-acetaminophen (NORCO) 10-325 MG tablet    Sig: Take 1  tablet by mouth every 6 (six) hours as needed for moderate pain.    Dispense:  60 tablet    Refill:  0    Murtis SinkSam Jazminn Pomales, MD [facility] 05/04/2017, 11:34 AM

## 2017-05-11 LAB — TOXASSURE SELECT 13 (MW), URINE

## 2017-05-24 DIAGNOSIS — E11610 Type 2 diabetes mellitus with diabetic neuropathic arthropathy: Secondary | ICD-10-CM | POA: Insufficient documentation

## 2017-06-17 ENCOUNTER — Other Ambulatory Visit: Payer: Self-pay | Admitting: Family Medicine

## 2017-07-25 ENCOUNTER — Ambulatory Visit (INDEPENDENT_AMBULATORY_CARE_PROVIDER_SITE_OTHER): Payer: BLUE CROSS/BLUE SHIELD | Admitting: Family Medicine

## 2017-07-25 ENCOUNTER — Encounter: Payer: Self-pay | Admitting: Family Medicine

## 2017-07-25 VITALS — BP 127/80 | HR 98 | Temp 97.9°F

## 2017-07-25 DIAGNOSIS — E1140 Type 2 diabetes mellitus with diabetic neuropathy, unspecified: Secondary | ICD-10-CM

## 2017-07-25 DIAGNOSIS — Z794 Long term (current) use of insulin: Secondary | ICD-10-CM | POA: Diagnosis not present

## 2017-07-25 LAB — BAYER DCA HB A1C WAIVED: HB A1C (BAYER DCA - WAIVED): 9 % — ABNORMAL HIGH (ref <7.0)

## 2017-07-25 MED ORDER — BASAGLAR KWIKPEN 100 UNIT/ML ~~LOC~~ SOPN: 35.0000 [IU] | PEN_INJECTOR | Freq: Every day | SUBCUTANEOUS | 1 refills | Status: DC

## 2017-07-25 MED ORDER — LISINOPRIL 5 MG PO TABS: 5.0000 mg | ORAL_TABLET | Freq: Every day | ORAL | 1 refills | Status: DC

## 2017-07-25 MED ORDER — INSULIN PEN NEEDLE 32G X 4 MM MISC: 3 refills | Status: DC

## 2017-07-25 MED ORDER — METFORMIN HCL 1000 MG PO TABS: 1000.0000 mg | ORAL_TABLET | Freq: Two times a day (BID) | ORAL | 1 refills | Status: DC

## 2017-07-25 MED ORDER — GLUCOSE BLOOD VI STRP: ORAL_STRIP | 2 refills | Status: DC

## 2017-07-25 MED ORDER — GABAPENTIN 600 MG PO TABS: 600.0000 mg | ORAL_TABLET | Freq: Every day | ORAL | 1 refills | Status: DC

## 2017-07-25 NOTE — Progress Notes (Signed)
BP 127/80   Pulse 98   Temp 97.9 F (36.6 C) (Oral)    Subjective:    Patient ID: [name], male    DOB: 01-12-1966, 51 y.o.   MRN: [id number]  HPI: [name] is a 51 y.o. male presenting on 07/25/2017 for Diabetes (patient will be losing insurance at beginning of year and going to [employer] nurse recommended he come here and talk to you about his diabetes and get instructions on what to do when his insulin is low or elevated) and Medication refills (Hydrocodone)   HPI Type 2 diabetes mellitus Patient comes in today for recheck of his diabetes. Patient has been currently taking Metformin and basiglar. Patient is currently on an ACE inhibitor/ARB. Patient has not seen an ophthalmologist this year. Patient denies any new issues with their feet but does have neuropathy that is not bothering him too bad at this point and is controlled on Neurontin.   Relevant past medical, surgical, family and social history reviewed and updated as indicated. Interim medical history since our last visit reviewed. Allergies and medications reviewed and updated.  Review of Systems  Constitutional: Negative for chills and fever.  Respiratory: Negative for shortness of breath and wheezing.   Cardiovascular: Negative for chest pain and leg swelling.  Musculoskeletal: Negative for back pain and gait problem.  Skin: Negative for rash.  Neurological: Positive for numbness. Negative for dizziness, weakness and light-headedness.  All other systems reviewed and are negative.   Per HPI unless specifically indicated above   Allergies as of 07/25/2017      Reactions   Penicillins Other (See Comments)   Has patient had a PCN reaction causing immediate rash, facial/tongue/throat swelling, SOB or lightheadedness with hypotension: No Has patient had a PCN reaction causing severe rash involving mucus membranes or skin necrosis: No Has patient had a PCN reaction that required hospitalization  No Has patient had a PCN reaction occurring within the last 10 years: No If all of the above answers are "NO", then may proceed with Cephalosporin use.      Medication List       Accurate as of 07/25/17  4:28 PM. Always use your most recent med list.          aspirin EC 81 MG EC tablet Generic drug:  aspirin Take 81 mg by mouth daily. Swallow whole.   BASAGLAR KWIKPEN 100 UNIT/ML Sopn Inject 0.35-0.5 mLs (35-50 Units total) into the skin at bedtime.   blood glucose meter kit and supplies Kit Dispense based on patient and insurance preference. Use up to four times daily as directed. (FOR ICD-9 250.00, 250.01).   doxycycline 100 MG tablet Commonly known as:  VIBRA-TABS 1 TABLET 2 TIMES A DAY FOR 10 DAYS   gabapentin 600 MG tablet Commonly known as:  NEURONTIN Take 1 tablet (600 mg total) by mouth at bedtime.   glucose blood test strip Commonly known as:  BAYER CONTOUR TEST TEST BLOOD SUGAR UP TO TWICE DAILY   HYDROcodone-acetaminophen 10-325 MG tablet Commonly known as:  NORCO Take 1 tablet by mouth every 6 (six) hours as needed for moderate pain.   Insulin Pen Needle 32G X 4 MM Misc Commonly known as:  BD PEN NEEDLE NANO U/F USE WITH INSULIN ONCE DAILY   lisinopril 5 MG tablet Commonly known as:  PRINIVIL,ZESTRIL Take 1 tablet (5 mg total) by mouth daily.   metFORMIN 1000 MG tablet Commonly known as:  GLUCOPHAGE Take 1 tablet (  1,000 mg total) by mouth 2 (two) times daily.   sulfamethoxazole-trimethoprim 400-80 MG tablet Commonly known as:  BACTRIM Take 1 tablet by mouth 2 (two) times daily.            Discharge Care Instructions        Start     Ordered   07/25/17 0000  [facility] Hb A1c Waived     07/25/17 1610   07/25/17 0000  gabapentin (NEURONTIN) 600 MG tablet  Daily at bedtime     07/25/17 1627   07/25/17 0000  Insulin Glargine (BASAGLAR KWIKPEN) 100 UNIT/ML SOPN  Daily at bedtime     07/25/17 1627   07/25/17 0000  lisinopril  (PRINIVIL,ZESTRIL) 5 MG tablet  Daily     07/25/17 1627   07/25/17 0000  Insulin Pen Needle (BD PEN NEEDLE NANO U/F) 32G X 4 MM MISC     07/25/17 1627   07/25/17 0000  metFORMIN (GLUCOPHAGE) 1000 MG tablet  2 times daily     07/25/17 1627   07/25/17 0000  glucose blood (BAYER CONTOUR TEST) test strip    Comments:  Dx 250.60   07/25/17 1627         Objective:    BP 127/80   Pulse 98   Temp 97.9 F (36.6 C) (Oral)   Wt Readings from Last 3 Encounters:  05/04/17 190 lb 3.2 oz (86.3 kg)  04/11/17 201 lb 8 oz (91.4 kg)  02/08/17 213 lb (96.6 kg)    Physical Exam  Constitutional: He is oriented to person, place, and time. He appears well-developed and well-nourished. No distress.  Eyes: Conjunctivae are normal. No scleral icterus.  Cardiovascular: Normal rate, regular rhythm, normal heart sounds and intact distal pulses.   No murmur heard. Pulmonary/Chest: Effort normal and breath sounds normal. No respiratory distress. He has no wheezes. He has no rales.  Musculoskeletal: Normal range of motion. He exhibits no edema.  Neurological: He is alert and oriented to person, place, and time. Coordination normal.  Skin: Skin is warm and dry. No rash noted. He is not diaphoretic.  Psychiatric: He has a normal mood and affect. His behavior is normal.  Nursing note and vitals reviewed.     Assessment & Plan:   Problem List Items Addressed This Visit      Endocrine   Type 2 diabetes mellitus ([place]) - Primary   Relevant Medications   gabapentin (NEURONTIN) 600 MG tablet   Insulin Glargine (BASAGLAR KWIKPEN) 100 UNIT/ML SOPN   lisinopril (PRINIVIL,ZESTRIL) 5 MG tablet   Insulin Pen Needle (BD PEN NEEDLE NANO U/F) 32G X 4 MM MISC   metFORMIN (GLUCOPHAGE) 1000 MG tablet   glucose blood (BAYER CONTOUR TEST) test strip   Other Relevant Orders   [facility] Hb A1c Waived (Completed)      Continue current medication, will monitor and check A1c, it sounds like his blood sugars have been  improving but we will see where it is at Follow up plan: Return in about 3 months (around 10/24/2017), or if symptoms worsen or fail to improve, for Recheck diabetes.  Counseling provided for all of the vaccine components Orders Placed This Encounter  Procedures  . [facility] Hb A1c [place], MD [facility] 07/25/2017, 4:28 PM

## 2017-07-25 NOTE — Patient Instructions (Signed)
If morning blood sugar is greater than 150 then increase by 2 or 3 units.  Repeat morning blood sugars for 3 days and if still greater than 150 then increase by 2 or 3 units  Keep increasing until the blood sugar in the morning and is consistently below 150

## 2017-07-26 ENCOUNTER — Telehealth: Payer: Self-pay

## 2017-07-26 DIAGNOSIS — M79673 Pain in unspecified foot: Principal | ICD-10-CM

## 2017-07-26 DIAGNOSIS — G8929 Other chronic pain: Secondary | ICD-10-CM

## 2017-07-26 MED ORDER — HYDROCODONE-ACETAMINOPHEN 10-325 MG PO TABS: 1.0000 | ORAL_TABLET | Freq: Four times a day (QID) | ORAL | 0 refills | Status: DC | PRN

## 2017-07-26 NOTE — Telephone Encounter (Signed)
Rx refilled.   Murtis Sink, MD [facility] 07/26/2017, 11:57 AM

## 2017-07-26 NOTE — Telephone Encounter (Signed)
I did write that on his patient instructions, for some reason he is thinking that they want him to do it like a sliding scale chart but he is only on long-acting insulin and I did write exactly what I want him to do on his patient instructions that I printed out for him. It should be on his AVS. If he does not have the AVS still been present in new one for him

## 2017-07-26 NOTE — Telephone Encounter (Signed)
Dr. Louanne Skye saw this patient yesterday for diabetes since you were out.  His insurance through his employer will end the last of October and he will be going on [employer] so he asked that we go ahead and send in his regular prescriptons for him before it ends, so we did.  However, Dr. Louanne Skye did not refill his Hydrocodone because of his pain contract with you.  Dr. Louanne Skye asked that I send this to you to see if you are able to refill this since he was seen.  Please advise.

## 2017-07-26 NOTE — Telephone Encounter (Signed)
Patient said he had requested at visit yesterday, a chart that directed him on what to do when his blood sugar was high or low, how many units of insulin to take, etc.  He will be coming in tomorrow to pick up prescription and would like to be able to pick this up then.

## 2017-07-26 NOTE — Telephone Encounter (Signed)
Prescription placed at front desk, patient aware

## 2017-07-27 NOTE — Telephone Encounter (Signed)
Patient did not get his AVS while he was here, he had already called today and someone else is sending him a copy of this.

## 2017-07-31 ENCOUNTER — Telehealth: Payer: Self-pay | Admitting: *Deleted

## 2017-07-31 DIAGNOSIS — Z794 Long term (current) use of insulin: Principal | ICD-10-CM

## 2017-07-31 DIAGNOSIS — E1140 Type 2 diabetes mellitus with diabetic neuropathy, unspecified: Secondary | ICD-10-CM

## 2017-07-31 MED ORDER — BASAGLAR KWIKPEN 100 UNIT/ML ~~LOC~~ SOPN: 35.0000 [IU] | PEN_INJECTOR | Freq: Every day | SUBCUTANEOUS | 1 refills | Status: DC

## 2017-07-31 NOTE — Telephone Encounter (Signed)
Updated Rx & no print 15 pens or 3 boxes is a 3 month supply

## 2017-08-06 ENCOUNTER — Other Ambulatory Visit: Payer: Self-pay | Admitting: Family Medicine

## 2017-08-06 DIAGNOSIS — Z794 Long term (current) use of insulin: Principal | ICD-10-CM

## 2017-08-06 DIAGNOSIS — E1140 Type 2 diabetes mellitus with diabetic neuropathy, unspecified: Secondary | ICD-10-CM

## 2017-08-06 MED ORDER — BASAGLAR KWIKPEN 100 UNIT/ML ~~LOC~~ SOPN: 35.0000 [IU] | PEN_INJECTOR | Freq: Every day | SUBCUTANEOUS | 1 refills | Status: DC

## 2017-08-06 NOTE — Telephone Encounter (Signed)
Rx sent to pharmacy   

## 2017-08-21 ENCOUNTER — Other Ambulatory Visit: Payer: Self-pay | Admitting: *Deleted

## 2017-08-21 ENCOUNTER — Ambulatory Visit (INDEPENDENT_AMBULATORY_CARE_PROVIDER_SITE_OTHER): Payer: BLUE CROSS/BLUE SHIELD

## 2017-08-21 DIAGNOSIS — Z23 Encounter for immunization: Secondary | ICD-10-CM

## 2017-08-21 MED ORDER — GLUCOSE BLOOD VI STRP: ORAL_STRIP | 2 refills | Status: DC

## 2017-08-21 MED ORDER — LANCETS 30G MISC: 2 refills | Status: DC

## 2017-08-30 ENCOUNTER — Other Ambulatory Visit: Payer: BLUE CROSS/BLUE SHIELD

## 2017-09-18 DIAGNOSIS — E11610 Type 2 diabetes mellitus with diabetic neuropathic arthropathy: Secondary | ICD-10-CM | POA: Diagnosis not present

## 2017-09-18 DIAGNOSIS — M14672 Charcot's joint, left ankle and foot: Secondary | ICD-10-CM | POA: Diagnosis not present

## 2017-09-18 DIAGNOSIS — M14679 Charcot's joint, unspecified ankle and foot: Secondary | ICD-10-CM | POA: Diagnosis not present

## 2017-09-18 DIAGNOSIS — E1142 Type 2 diabetes mellitus with diabetic polyneuropathy: Secondary | ICD-10-CM | POA: Diagnosis not present

## 2017-09-21 LAB — HM DIABETES EYE EXAM

## 2017-09-24 DIAGNOSIS — M21072 Valgus deformity, not elsewhere classified, left ankle: Secondary | ICD-10-CM | POA: Diagnosis not present

## 2017-09-24 DIAGNOSIS — Z7982 Long term (current) use of aspirin: Secondary | ICD-10-CM | POA: Diagnosis not present

## 2017-09-24 DIAGNOSIS — Z7984 Long term (current) use of oral hypoglycemic drugs: Secondary | ICD-10-CM | POA: Diagnosis not present

## 2017-09-24 DIAGNOSIS — Z79899 Other long term (current) drug therapy: Secondary | ICD-10-CM | POA: Diagnosis not present

## 2017-09-24 DIAGNOSIS — E11610 Type 2 diabetes mellitus with diabetic neuropathic arthropathy: Secondary | ICD-10-CM | POA: Diagnosis not present

## 2017-09-24 DIAGNOSIS — M14672 Charcot's joint, left ankle and foot: Secondary | ICD-10-CM | POA: Diagnosis not present

## 2017-09-24 DIAGNOSIS — I10 Essential (primary) hypertension: Secondary | ICD-10-CM | POA: Diagnosis not present

## 2017-10-02 DIAGNOSIS — Z4789 Encounter for other orthopedic aftercare: Secondary | ICD-10-CM | POA: Diagnosis not present

## 2017-10-19 DIAGNOSIS — M14672 Charcot's joint, left ankle and foot: Secondary | ICD-10-CM | POA: Diagnosis not present

## 2018-01-12 DIAGNOSIS — Z89512 Acquired absence of left leg below knee: Secondary | ICD-10-CM | POA: Insufficient documentation

## 2018-01-24 ENCOUNTER — Encounter: Payer: Self-pay | Admitting: Physician Assistant

## 2018-01-24 ENCOUNTER — Ambulatory Visit: Payer: Self-pay | Admitting: Physician Assistant

## 2018-01-24 VITALS — BP 138/92 | HR 123 | Temp 97.9°F

## 2018-01-24 DIAGNOSIS — E1142 Type 2 diabetes mellitus with diabetic polyneuropathy: Secondary | ICD-10-CM

## 2018-01-24 DIAGNOSIS — E1165 Type 2 diabetes mellitus with hyperglycemia: Secondary | ICD-10-CM

## 2018-01-24 DIAGNOSIS — M14671 Charcot's joint, right ankle and foot: Secondary | ICD-10-CM

## 2018-01-24 DIAGNOSIS — Z125 Encounter for screening for malignant neoplasm of prostate: Secondary | ICD-10-CM

## 2018-01-24 DIAGNOSIS — R000 Tachycardia, unspecified: Secondary | ICD-10-CM

## 2018-01-24 DIAGNOSIS — Z1211 Encounter for screening for malignant neoplasm of colon: Secondary | ICD-10-CM

## 2018-01-24 DIAGNOSIS — I10 Essential (primary) hypertension: Secondary | ICD-10-CM

## 2018-01-24 NOTE — Progress Notes (Signed)
BP (!) 143/95 (BP Location: Left Arm, Patient Position: Sitting, Cuff Size: Normal)   Pulse (!) 123   Temp 97.9 F (36.6 C)   SpO2 95%    Subjective:    Patient ID: [name], male    DOB: 20-Dec-1965, 52 y.o.   MRN: [id number]  HPI: [name] is a 52 y.o. male presenting on 01/24/2018 for New Patient (Initial Visit)   HPI  Chief Complaint  Patient presents with  . New Patient (Initial Visit)    Pt used to have insurance and doesn't any longer.  He is trying to get medicaid  Pt previously of [facility].  Pt still sees dr Zenda Alpers,  Podiatrist for R charcot foot.  He had BKA on L a few weeks ago.   Pt has spoken with someone about a prosthetic.  Pt had the L BKA due to complications and sepsis after surgery for charcot foot/ankle.  Pt hasn't seen PCP since September  Pt states fbs about 225 lately.  He had gone up to 50 units of the basaglar.  He has been out of his test stips for about 2 weeks.   Last DM eye exam- [place] my eye doctor last year- 09/21/2017  a1c in hospital was 11.2  Pt has follow-up with surgeon-  April 16.   He says he has Never had a colonoscopy  Pt says he forgot his lisinopril this morning and he thinks that is why his bp and pulse are up.  Pt denies any CP or sob.  No history or heart problems.   Pt says he is feeling well.  Pt says he recently increased his insulin to 50 u qam but hasn't been checking his bs because he ran out of test strips  Relevant past medical, surgical, family and social history reviewed and updated as indicated. Interim medical history since our last visit reviewed. Allergies and medications reviewed and updated.   Current Outpatient Medications:  .  aspirin (ASPIRIN EC) 81 MG EC tablet, Take 81 mg by mouth daily. Swallow whole., Disp: , Rfl:  .  gabapentin (NEURONTIN) 600 MG tablet, Take 1 tablet (600 mg total) by mouth at bedtime., Disp: 90 tablet, Rfl: 1 .  HYDROcodone-acetaminophen  (NORCO) 10-325 MG tablet, Take 1 tablet by mouth every 6 (six) hours as needed for moderate pain., Disp: 60 tablet, Rfl: 0 .  ibuprofen (ADVIL,MOTRIN) 200 MG tablet, Take 200 mg by mouth every 6 (six) hours as needed., Disp: , Rfl:  .  Insulin Glargine (BASAGLAR KWIKPEN) 100 UNIT/ML SOPN, Inject 0.35-0.5 mLs (35-50 Units total) into the skin at bedtime. (Patient taking differently: Inject 42 Units into the skin every morning. ), Disp: 15 pen, Rfl: 1 .  lisinopril (PRINIVIL,ZESTRIL) 5 MG tablet, Take 1 tablet (5 mg total) by mouth daily., Disp: 90 tablet, Rfl: 1 .  metFORMIN (GLUCOPHAGE) 1000 MG tablet, Take 1 tablet (1,000 mg total) by mouth 2 (two) times daily., Disp: 180 tablet, Rfl: 1 .  Multiple Vitamins-Minerals (MULTIVITAMIN WITH MINERALS) tablet, Take 1 tablet by mouth daily., Disp: , Rfl:  .  oxyCODONE (OXY IR/ROXICODONE) 5 MG immediate release tablet, Take 5 mg by mouth every 4 (four) hours as needed for severe pain., Disp: , Rfl:  .  vitamin C (ASCORBIC ACID) 500 MG tablet, Take 500 mg by mouth daily., Disp: , Rfl:  .  zinc gluconate 50 MG tablet, Take 50 mg by mouth daily., Disp: , Rfl:    Review of  Systems  Constitutional: Negative for appetite change, chills, diaphoresis, fatigue, fever and unexpected weight change.  HENT: Negative for congestion, dental problem, drooling, ear pain, facial swelling, hearing loss, mouth sores, sneezing, sore throat, trouble swallowing and voice change.   Eyes: Negative for pain, discharge, redness, itching and visual disturbance.  Respiratory: Negative for cough, choking, shortness of breath and wheezing.   Cardiovascular: Negative for chest pain, palpitations and leg swelling.  Gastrointestinal: Negative for abdominal pain, blood in stool, constipation, diarrhea and vomiting.  Endocrine: Negative for cold intolerance, heat intolerance and polydipsia.  Genitourinary: Negative for decreased urine volume, dysuria and hematuria.  Musculoskeletal:  Negative for arthralgias, back pain and gait problem.  Skin: Negative for rash.  Allergic/Immunologic: Negative for environmental allergies.  Neurological: Negative for seizures, syncope, light-headedness and headaches.  Hematological: Negative for adenopathy.  Psychiatric/Behavioral: Negative for agitation, dysphoric mood and suicidal ideas. The patient is not nervous/anxious.     Per HPI unless specifically indicated above     Objective:    BP (!) 143/95 (BP Location: Left Arm, Patient Position: Sitting, Cuff Size: Normal)   Pulse (!) 123   Temp 97.9 F (36.6 C)   SpO2 95%   Wt Readings from Last 3 Encounters:  05/04/17 190 lb 3.2 oz (86.3 kg)  04/11/17 201 lb 8 oz (91.4 kg)  02/08/17 213 lb (96.6 kg)    Physical Exam  Constitutional: He is oriented to person, place, and time. He appears well-developed and well-nourished.  HENT:  Head: Normocephalic and atraumatic.  Mouth/Throat: Oropharynx is clear and moist. No oropharyngeal exudate.  Eyes: Pupils are equal, round, and reactive to light. Conjunctivae and EOM are normal.  Neck: Neck supple. No thyromegaly present.  Cardiovascular: Regular rhythm, normal heart sounds and normal pulses. Tachycardia present.  Pulmonary/Chest: Effort normal and breath sounds normal. He has no wheezes. He has no rales.  Abdominal: Soft. Bowel sounds are normal. He exhibits no mass. There is no hepatosplenomegaly. There is no tenderness.  Musculoskeletal: He exhibits no edema.  R charcot foot deformity.   No tenderness.  No ulceration or skin breakdown.  S/p amputation R great toe and R 5th toes.  Nails surgically removed.  L stump at mid-shin- staples intact without signs infection- no redness, swelling or discharge.   Lymphadenopathy:    He has no cervical adenopathy.  Neurological: He is alert and oriented to person, place, and time.  Skin: Skin is warm and dry. No rash noted.  Psychiatric: He has a normal mood and affect. His behavior is  normal. Thought content normal.  Vitals reviewed.    EKG- ST at 115.  No obvious ischemic changes    Assessment & Plan:   Encounter Diagnoses  Name Primary?  Marland Kitchen. Uncontrolled type 2 diabetes mellitus with hyperglycemia (HCC) Yes  . Tachycardia   . Essential hypertension   . Screening for prostate cancer   . Screening for colon cancer   . Diabetic polyneuropathy associated with type 2 diabetes mellitus (HCC)   . Charcot's joint of right ankle      -Fasting labs tomorrow morning -Pt to Monitor fbs -Pt was given iFOBT for colon cancer screening -Pt will continue basaglar since pt has plenty at home for now. -discussed medication and further evaluation of tachycardia but pt states it is only due to forgetting his lisinopril today and declines new medication -Discussed increase in gabapentin if needed -pt to follow up with podiatrist and surgeon as scheduled -F/u 2 wk with bs log.  He is to RTO sooner if he has any problems or starts feeling bad.

## 2018-01-26 ENCOUNTER — Other Ambulatory Visit: Payer: Self-pay | Admitting: Physician Assistant

## 2018-01-26 DIAGNOSIS — Z1211 Encounter for screening for malignant neoplasm of colon: Secondary | ICD-10-CM

## 2018-01-30 ENCOUNTER — Other Ambulatory Visit (HOSPITAL_COMMUNITY)
Admission: RE | Admit: 2018-01-30 | Discharge: 2018-01-30 | Disposition: A | Discharge status: Home / Self Care | Payer: BLUE CROSS/BLUE SHIELD | Source: Ambulatory Visit | Attending: Physician Assistant | Admitting: Physician Assistant

## 2018-01-30 DIAGNOSIS — E1165 Type 2 diabetes mellitus with hyperglycemia: Secondary | ICD-10-CM | POA: Insufficient documentation

## 2018-01-30 DIAGNOSIS — Z125 Encounter for screening for malignant neoplasm of prostate: Secondary | ICD-10-CM

## 2018-01-30 DIAGNOSIS — R000 Tachycardia, unspecified: Secondary | ICD-10-CM

## 2018-01-30 DIAGNOSIS — I10 Essential (primary) hypertension: Secondary | ICD-10-CM

## 2018-01-30 DIAGNOSIS — E1142 Type 2 diabetes mellitus with diabetic polyneuropathy: Secondary | ICD-10-CM

## 2018-01-30 LAB — CBC
HEMATOCRIT: 46.6 % (ref 39.0–52.0)
HEMOGLOBIN: 15.9 g/dL (ref 13.0–17.0)
MCH: 29.9 pg (ref 26.0–34.0)
MCHC: 34.1 g/dL (ref 30.0–36.0)
MCV: 87.6 fL (ref 78.0–100.0)
Platelets: 169 10*3/uL (ref 150–400)
RBC: 5.32 MIL/uL (ref 4.22–5.81)
RDW: 13.6 % (ref 11.5–15.5)
WBC: 5 10*3/uL (ref 4.0–10.5)

## 2018-01-30 LAB — COMPREHENSIVE METABOLIC PANEL
ALT: 36 U/L (ref 17–63)
AST: 25 U/L (ref 15–41)
Albumin: 4.1 g/dL (ref 3.5–5.0)
Alkaline Phosphatase: 182 U/L — ABNORMAL HIGH (ref 38–126)
Anion gap: 13 (ref 5–15)
BUN: 12 mg/dL (ref 6–20)
CHLORIDE: 98 mmol/L — AB (ref 101–111)
CO2: 25 mmol/L (ref 22–32)
Calcium: 9.7 mg/dL (ref 8.9–10.3)
Creatinine, Ser: 0.89 mg/dL (ref 0.61–1.24)
GFR calc Af Amer: >60 mL/min (ref >60)
Glucose, Bld: 348 mg/dL — ABNORMAL HIGH (ref 65–99)
POTASSIUM: 4.1 mmol/L (ref 3.5–5.1)
SODIUM: 136 mmol/L (ref 135–145)
Total Bilirubin: 1.3 mg/dL — ABNORMAL HIGH (ref 0.3–1.2)
Total Protein: 8.1 g/dL (ref 6.5–8.1)

## 2018-01-30 LAB — PSA: Prostatic Specific Antigen: 0.29 ng/mL (ref 0.00–4.00)

## 2018-01-30 LAB — LIPID PANEL
CHOL/HDL RATIO: 4.6 ratio
CHOLESTEROL: 139 mg/dL (ref 0–200)
HDL: 30 mg/dL — AB (ref >40)
LDL Cholesterol: 55 mg/dL (ref 0–99)
Triglycerides: 272 mg/dL — ABNORMAL HIGH (ref <150)
VLDL: 54 mg/dL — ABNORMAL HIGH (ref 0–40)

## 2018-01-30 LAB — HEMOGLOBIN A1C
HEMOGLOBIN A1C: 9.7 % — AB (ref 4.8–5.6)
MEAN PLASMA GLUCOSE: 231.69 mg/dL

## 2018-01-31 LAB — MICROALBUMIN, URINE: MICROALB UR: 93.4 ug/mL — AB

## 2018-02-07 ENCOUNTER — Ambulatory Visit: Payer: Self-pay | Admitting: Physician Assistant

## 2018-02-13 ENCOUNTER — Ambulatory Visit (INDEPENDENT_AMBULATORY_CARE_PROVIDER_SITE_OTHER): Payer: BLUE CROSS/BLUE SHIELD | Admitting: Family Medicine

## 2018-02-13 ENCOUNTER — Encounter: Payer: Self-pay | Admitting: Family Medicine

## 2018-02-13 VITALS — BP 148/96 | HR 107 | Temp 98.0°F

## 2018-02-13 DIAGNOSIS — G8929 Other chronic pain: Secondary | ICD-10-CM | POA: Diagnosis not present

## 2018-02-13 DIAGNOSIS — M79673 Pain in unspecified foot: Secondary | ICD-10-CM | POA: Diagnosis not present

## 2018-02-13 DIAGNOSIS — Z89512 Acquired absence of left leg below knee: Secondary | ICD-10-CM

## 2018-02-13 DIAGNOSIS — L03116 Cellulitis of left lower limb: Secondary | ICD-10-CM | POA: Diagnosis not present

## 2018-02-13 DIAGNOSIS — Z794 Long term (current) use of insulin: Secondary | ICD-10-CM

## 2018-02-13 DIAGNOSIS — E1140 Type 2 diabetes mellitus with diabetic neuropathy, unspecified: Secondary | ICD-10-CM

## 2018-02-13 MED ORDER — SULFAMETHOXAZOLE-TRIMETHOPRIM 800-160 MG PO TABS: 1.0000 | ORAL_TABLET | Freq: Two times a day (BID) | ORAL | 0 refills | Status: DC

## 2018-02-13 MED ORDER — HYDROCODONE-ACETAMINOPHEN 10-325 MG PO TABS: 1.0000 | ORAL_TABLET | Freq: Three times a day (TID) | ORAL | 0 refills | Status: DC | PRN

## 2018-02-13 MED ORDER — BASAGLAR KWIKPEN 100 UNIT/ML ~~LOC~~ SOPN: 50.0000 [IU] | PEN_INJECTOR | Freq: Every day | SUBCUTANEOUS | 1 refills | Status: DC

## 2018-02-13 NOTE — Progress Notes (Signed)
BP (!) 148/96   Pulse (!) 107   Temp 98 F (36.7 C) (Oral)    Subjective:    Patient ID: [name], male    DOB: 20-Nov-1965, 52 y.o.   MRN: [id number]  HPI: [name] is a 52 y.o. male presenting on 02/13/2018 for Redness around area of leg that was amputated (goes back to see vascular surgeon next Tuesday)   HPI Status post amputation secondary to diabetic foot infection Patient is coming in today for hospital follow-up and reestablish care with our office status post left BKA secondary to diabetic wound infection of left lower extremity.  He still has significant pain from the procedure and pain in his foot and leg from neuropathy and wants a refill for pain medications.  He says his blood sugars are running around 190-200 and he did [facility] but came back is 9.1 which is down from 11.  He says he is currently using about 50 units of basic lar at bedtime and metformin for his diabetes.  He is coming in today because on the wound site around where his staples are 1 of the staples is popped out because of swelling and redness and is starting to get some purulent drainage.  He denies any fevers or chills or redness or warmth anywhere else has a 4 x 5 cm area of erythema around the wound site healing.  He has significant pain there as well.  He goes to see his vascular surgeon next week.  Relevant past medical, surgical, family and social history reviewed and updated as indicated. Interim medical history since our last visit reviewed. Allergies and medications reviewed and updated.  Review of Systems  Constitutional: Negative for chills and fever.  Respiratory: Negative for shortness of breath and wheezing.   Cardiovascular: Negative for chest pain and leg swelling.  Musculoskeletal: Positive for arthralgias. Negative for back pain and gait problem.  Skin: Positive for color change and wound. Negative for rash.  All other systems reviewed and are  negative.   Per HPI unless specifically indicated above   Allergies as of 02/13/2018      Reactions   Penicillins Other (See Comments)   Has patient had a PCN reaction causing immediate rash, facial/tongue/throat swelling, SOB or lightheadedness with hypotension: No Has patient had a PCN reaction causing severe rash involving mucus membranes or skin necrosis: No Has patient had a PCN reaction that required hospitalization No Has patient had a PCN reaction occurring within the last 10 years: No If all of the above answers are "NO", then may proceed with Cephalosporin use.      Medication List        Accurate as of 02/13/18  4:02 PM. Always use your most recent med list.          aspirin EC 81 MG EC tablet Generic drug:  aspirin Take 81 mg by mouth daily. Swallow whole.   BASAGLAR KWIKPEN 100 UNIT/ML Sopn Inject 0.5 mLs (50 Units total) into the skin at bedtime.   gabapentin 600 MG tablet Commonly known as:  NEURONTIN Take 1 tablet (600 mg total) by mouth at bedtime.   HYDROcodone-acetaminophen 10-325 MG tablet Commonly known as:  NORCO Take 1 tablet by mouth every 8 (eight) hours as needed for moderate pain.   ibuprofen 200 MG tablet Commonly known as:  ADVIL,MOTRIN Take 200 mg by mouth every 6 (six) hours as needed.   lisinopril 5 MG  tablet Commonly known as:  PRINIVIL,ZESTRIL Take 1 tablet (5 mg total) by mouth daily.   metFORMIN 1000 MG tablet Commonly known as:  GLUCOPHAGE Take 1 tablet (1,000 mg total) by mouth 2 (two) times daily.   multivitamin with minerals tablet Take 1 tablet by mouth daily.   oxyCODONE 5 MG immediate release tablet Commonly known as:  Oxy IR/ROXICODONE Take 5 mg by mouth every 4 (four) hours as needed for severe pain.   sulfamethoxazole-trimethoprim 800-160 MG tablet Commonly known as:  BACTRIM DS,SEPTRA DS Take 1 tablet by mouth 2 (two) times daily.   vitamin C 500 MG tablet Commonly known as:  ASCORBIC ACID Take 500 mg by  mouth daily.   zinc gluconate 50 MG tablet Take 50 mg by mouth daily.          Objective:    BP (!) 148/96   Pulse (!) 107   Temp 98 F (36.7 C) (Oral)   Wt Readings from Last 3 Encounters:  05/04/17 190 lb 3.2 oz (86.3 kg)  04/11/17 201 lb 8 oz (91.4 kg)  02/08/17 213 lb (96.6 kg)    Physical Exam  Constitutional: He is oriented to person, place, and time. He appears well-developed and well-nourished. No distress.  Eyes: Conjunctivae are normal. No scleral icterus.  Cardiovascular: Normal rate, regular rhythm, normal heart sounds and intact distal pulses.  No murmur heard. Pulmonary/Chest: Effort normal and breath sounds normal. No respiratory distress. He has no wheezes. He has no rales.  Musculoskeletal: Normal range of motion. He exhibits edema (1+ edema in both lower extremities) and deformity (Status post BKA of left leg).  Neurological: He is alert and oriented to person, place, and time. Coordination normal.  Skin: Skin is warm and dry. No rash noted. He is not diaphoretic. There is erythema (4 x 5 area of erythema surrounding the anterior aspect of the wound site, one staple has popped out because of swelling and there is a small wound dehiscence where the staples out.).  Psychiatric: He has a normal mood and affect. His behavior is normal.  Nursing note and vitals reviewed.   Results for orders placed or performed during the hospital encounter of 01/30/18  CBC  Result Value Ref Range   WBC 5.0 4.0 - 10.5 K/uL   RBC 5.32 4.22 - 5.81 MIL/uL   Hemoglobin 15.9 13.0 - 17.0 g/dL   HCT 16.1 09.6 - 04.5 %   MCV 87.6 78.0 - 100.0 fL   MCH 29.9 26.0 - 34.0 pg   MCHC 34.1 30.0 - 36.0 g/dL   RDW 40.9 81.1 - 91.4 %   Platelets 169 150 - 400 K/uL  PSA  Result Value Ref Range   Prostatic Specific Antigen 0.29 0.00 - 4.00 ng/mL  Lipid panel  Result Value Ref Range   Cholesterol 139 0 - 200 mg/dL   Triglycerides 782 (H) <150 mg/dL   HDL 30 (L) >95 mg/dL   Total CHOL/HDL  Ratio 4.6 RATIO   VLDL 54 (H) 0 - 40 mg/dL   LDL Cholesterol 55 0 - 99 mg/dL  Hemoglobin A2Z  Result Value Ref Range   Hgb A1c MFr Bld 9.7 (H) 4.8 - 5.6 %   Mean Plasma Glucose 231.69 mg/dL  Comprehensive metabolic panel  Result Value Ref Range   Sodium 136 135 - 145 mmol/L   Potassium 4.1 3.5 - 5.1 mmol/L   Chloride 98 (L) 101 - 111 mmol/L   CO2 25 22 - 32 mmol/L  Glucose, Bld 348 (H) 65 - 99 mg/dL   BUN 12 6 - 20 mg/dL   Creatinine, Ser 5.780.89 0.61 - 1.24 mg/dL   Calcium 9.7 8.9 - 46.910.3 mg/dL   Total Protein 8.1 6.5 - 8.1 g/dL   Albumin 4.1 3.5 - 5.0 g/dL   AST 25 15 - 41 U/L   ALT 36 17 - 63 U/L   Alkaline Phosphatase 182 (H) 38 - 126 U/L   Total Bilirubin 1.3 (H) 0.3 - 1.2 mg/dL   GFR calc non Af Amer >60 >60 mL/min   GFR calc Af Amer >60 >60 mL/min   Anion gap 13 5 - 15  Microalbumin, urine  Result Value Ref Range   Microalb, Ur 93.4 (H) Not Estab. ug/mL      Assessment & Plan:   Problem List Items Addressed This Visit      Endocrine   Type 2 diabetes mellitus (HCC)   Relevant Medications   Insulin Glargine (BASAGLAR KWIKPEN) 100 UNIT/ML SOPN    Other Visit Diagnoses    Cellulitis of left lower extremity    -  Primary   Relevant Medications   sulfamethoxazole-trimethoprim (BACTRIM DS,SEPTRA DS) 800-160 MG tablet   S/P BKA (below knee amputation) unilateral, left (HCC)       Relevant Medications   HYDROcodone-acetaminophen (NORCO) 10-325 MG tablet   Chronic foot pain, unspecified laterality       Relevant Medications   HYDROcodone-acetaminophen (NORCO) 10-325 MG tablet      He will likely have to heal by secondary intention on area that is open, will do Bactrim for antibiotic  Follow up plan: Return in about 2 weeks (around 02/27/2018), or if symptoms worsen or fail to improve, for dm recheck.  Counseling provided for all of the vaccine components No orders of the defined types were placed in this encounter.   Arville CareJoshua Dettinger, MD [facility] 02/13/2018, 4:02 PM

## 2018-02-21 ENCOUNTER — Other Ambulatory Visit: Payer: Self-pay | Admitting: Family Medicine

## 2018-02-21 DIAGNOSIS — E1140 Type 2 diabetes mellitus with diabetic neuropathy, unspecified: Secondary | ICD-10-CM

## 2018-02-21 DIAGNOSIS — Z794 Long term (current) use of insulin: Principal | ICD-10-CM

## 2018-03-04 LAB — IFOBT (OCCULT BLOOD): IFOBT: NEGATIVE

## 2018-03-18 ENCOUNTER — Telehealth: Payer: Self-pay | Admitting: Family Medicine

## 2018-03-18 NOTE — Telephone Encounter (Signed)
Appt made with Dr. Louanne Skye Friday 03/22/18

## 2018-03-18 NOTE — Telephone Encounter (Signed)
I do not know the answer to this, do you know how to go about getting a prosthetic leg?

## 2018-03-19 DIAGNOSIS — E1342 Other specified diabetes mellitus with diabetic polyneuropathy: Secondary | ICD-10-CM | POA: Diagnosis not present

## 2018-03-19 DIAGNOSIS — L601 Onycholysis: Secondary | ICD-10-CM | POA: Diagnosis not present

## 2018-03-22 ENCOUNTER — Encounter: Payer: Self-pay | Admitting: Family Medicine

## 2018-03-22 ENCOUNTER — Telehealth: Payer: Self-pay | Admitting: Family Medicine

## 2018-03-22 ENCOUNTER — Ambulatory Visit: Payer: BLUE CROSS/BLUE SHIELD | Admitting: Family Medicine

## 2018-03-22 VITALS — BP 138/94 | HR 88 | Temp 97.7°F

## 2018-03-22 DIAGNOSIS — E1140 Type 2 diabetes mellitus with diabetic neuropathy, unspecified: Secondary | ICD-10-CM

## 2018-03-22 DIAGNOSIS — Z89512 Acquired absence of left leg below knee: Secondary | ICD-10-CM | POA: Diagnosis not present

## 2018-03-22 DIAGNOSIS — Z794 Long term (current) use of insulin: Principal | ICD-10-CM

## 2018-03-22 MED ORDER — BLOOD GLUCOSE TEST VI STRP: 1.0000 | ORAL_STRIP | Freq: Two times a day (BID) | 11 refills | Status: DC

## 2018-03-22 MED ORDER — BASAGLAR KWIKPEN 100 UNIT/ML ~~LOC~~ SOPN: 50.0000 [IU] | PEN_INJECTOR | Freq: Every day | SUBCUTANEOUS | 11 refills | Status: DC

## 2018-03-22 MED ORDER — BASAGLAR KWIKPEN 100 UNIT/ML ~~LOC~~ SOPN: 50.0000 [IU] | PEN_INJECTOR | Freq: Every day | SUBCUTANEOUS | 1 refills | Status: DC

## 2018-03-22 NOTE — Telephone Encounter (Signed)
Go ahead and change it and send it in for a full year

## 2018-03-22 NOTE — Telephone Encounter (Signed)
Please advise 

## 2018-03-22 NOTE — Progress Notes (Signed)
BP (!) 138/94   Pulse 88   Temp 97.7 F (36.5 C) (Oral)    Subjective:    Patient ID: [name], male    DOB: 1966/07/30, 52 y.o.   MRN: [id number]  HPI: [name] is a 52 y.o. male presenting on 03/22/2018 for Prescription for prosthetic (per [facility] [phone] Marlon Pel; notes must include reason why he needs the prosthetic, etc)   HPI Status post BKA coming in for prosthesis order Patient is coming in for a visit to get order for prosthetic leg.  Patient had below the knee amputation on January 11, 2018.  They have been working with swelling and getting it to heal up to this point and he is finally to the point where he can start thinking about a prosthetic.  He is coming in today for an order so he can go get sized and fitted for proper prosthetic for him.  Prior to having his amputation and infections in that foot that led to the amputation 2 years ago the patient was active working and Designer, multimedia and basketball and jogging and he expects to hope to be able to do all those things again with a prosthesis.  His functional K level would be a level 4.  Patient recently had his below the knee amputation so he has not had any type of prosthetic device previously and this is going to be his first time trying to get a prosthetic device for that left lower leg.  Patient would need a below the knee prosthetic device and would like one that would allow him to be active like he was before including mowing the lawn and possibly playing softball and jogging and playing basketball like he used to.  Patient currently without a prosthetic device is almost primarily wheelchair-bound.  He is able to transfer in and out of the wheelchair easily but because he is missing his left lower leg and foot he is unable to ambulate without a wheelchair or a crutch.  He says the crutch frequently makes him feel like he is going to fall because it is less stable and that is why is coming in for a  prosthetic device.  Again patient does not have a previous prosthetic device and this is a new order after a recent BKA.  Patient has already contacted [facility] in [place] and is planning to go through them for his type of prosthetic device and they will work with him to gets one that we will make him as active as possible.  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  His left leg stump is well-healed now and the swelling has gone down greatly and is almost to what would be considered his normal size.  Patient is having some issues with wound healing on his right foot but is being followed very closely with podiatry to help manage that and he is able to use that right foot almost normally.  Patient is expected to need a prosthetic device for the rest of his life for that left lower extremity  Relevant past medical, surgical, family and social history reviewed and updated as indicated. Interim medical history since our last visit reviewed. Allergies and medications reviewed and updated.  Review of Systems  Constitutional: Negative for chills and fever.  Respiratory: Negative for shortness of breath and wheezing.   Cardiovascular: Negative for chest pain  and leg swelling.  Musculoskeletal: Negative for back pain, gait problem and joint swelling.  Skin: Negative for color change, rash and wound.  All other systems reviewed and are negative.   Per HPI unless specifically indicated above   Allergies as of 03/22/2018      Reactions   Penicillins Other (See Comments)   Has patient had a PCN reaction causing immediate rash, facial/tongue/throat swelling, SOB or lightheadedness with hypotension: No Has patient had a PCN reaction causing severe rash involving mucus membranes or skin necrosis: No Has patient had a PCN reaction that required hospitalization No Has patient had a PCN reaction occurring within the last 10  years: No If all of the above answers are "NO", then may proceed with Cephalosporin use.      Medication List        Accurate as of 03/22/18  8:44 AM. Always use your most recent med list.          aspirin EC 81 MG EC tablet Generic drug:  aspirin Take 81 mg by mouth daily. Swallow whole.   BASAGLAR KWIKPEN 100 UNIT/ML Sopn Inject 0.5 mLs (50 Units total) into the skin at bedtime.   BLOOD GLUCOSE TEST STRIPS Strp 1 strip by In Vitro route 2 (two) times daily.   doxycycline 100 MG capsule Commonly known as:  VIBRAMYCIN Take 1 capsule by mouth 2 (two) times daily.   gabapentin 600 MG tablet Commonly known as:  NEURONTIN Take 1 tablet (600 mg total) by mouth at bedtime.   HYDROcodone-acetaminophen 10-325 MG tablet Commonly known as:  NORCO Take 1 tablet by mouth every 8 (eight) hours as needed for moderate pain.   ibuprofen 200 MG tablet Commonly known as:  ADVIL,MOTRIN Take 200 mg by mouth every 6 (six) hours as needed.   lisinopril 5 MG tablet Commonly known as:  PRINIVIL,ZESTRIL TAKE 1 TABLET DAILY   metFORMIN 1000 MG tablet Commonly known as:  GLUCOPHAGE TAKE 1 TABLET TWICE A DAY   multivitamin with minerals tablet Take 1 tablet by mouth daily.   oxyCODONE 5 MG immediate release tablet Commonly known as:  Oxy IR/ROXICODONE Take 5 mg by mouth every 4 (four) hours as needed for severe pain.   vitamin C 500 MG tablet Commonly known as:  ASCORBIC ACID Take 500 mg by mouth daily.   zinc gluconate 50 MG tablet Take 50 mg by mouth daily.          Objective:    BP (!) 138/94   Pulse 88   Temp 97.7 F (36.5 C) (Oral)   Wt Readings from Last 3 Encounters:  05/04/17 190 lb 3.2 oz (86.3 kg)  04/11/17 201 lb 8 oz (91.4 kg)  02/08/17 213 lb (96.6 kg)    Physical Exam  Constitutional: He is oriented to person, place, and time. He appears well-developed and well-nourished. No distress.  Eyes: Conjunctivae are normal. No scleral icterus.  Cardiovascular:  Normal rate, regular rhythm, normal heart sounds and intact distal pulses.  No murmur heard. Pulmonary/Chest: Effort normal and breath sounds normal. No respiratory distress. He has no wheezes.  Musculoskeletal: Normal range of motion. He exhibits no edema.       Left lower leg: He exhibits swelling (Small amounts of swelling but almost completely gone) and deformity (Left below the knee amputation). He exhibits no tenderness and no edema.  Neurological: He is alert and oriented to person, place, and time. Coordination normal.  Skin: Skin is warm and dry. No rash noted.  He is not diaphoretic.  Psychiatric: He has a normal mood and affect. His behavior is normal.  Nursing note and vitals reviewed.     Assessment & Plan:   Problem List Items Addressed This Visit      Endocrine   Type 2 diabetes mellitus (HCC)   Relevant Medications   Insulin Glargine (BASAGLAR KWIKPEN) 100 UNIT/ML SOPN   Glucose Blood (BLOOD GLUCOSE TEST STRIPS) STRP    Other Visit Diagnoses    S/P BKA (below knee amputation) unilateral, left (HCC)    -  Primary   Relevant Orders   DME Other see comment      Patient needs medication refill on test strips for his diabetes, he is following up next month.  Follow up plan: Return in about 1 month (around 04/19/2018), or if symptoms worsen or fail to improve, for Diabetes recheck.  Counseling provided for all of the vaccine components Orders Placed This Encounter  Procedures  . DME Other see comment    Arville Care, MD [facility] 03/22/2018, 8:44 AM

## 2018-03-26 DIAGNOSIS — E1342 Other specified diabetes mellitus with diabetic polyneuropathy: Secondary | ICD-10-CM | POA: Diagnosis not present

## 2018-03-26 DIAGNOSIS — L601 Onycholysis: Secondary | ICD-10-CM | POA: Diagnosis not present

## 2018-04-04 IMAGING — MR MR FOOT*R* WO/W CM
4 of 9 series · 13 of 40 positions shown · IV contrast (multihance)
Comparison: None.

CLINICAL DATA: History diabetes, prior amputation of the great toe.
No known injury. Fifth toe pain.

EXAM:
MRI OF THE RIGHT FOREFOOT WITHOUT AND WITH CONTRAST
TECHNIQUE: Multiplanar, multisequence MR imaging was performed both before and
after administration of intravenous contrast.
CONTRAST:  19mL MULTIHANCE GADOBENATE DIMEGLUMINE 529 MG/ML IV SOLN

[Series 4: T1 · coronal · 3.0mm · 0.18mm/px · 4 of 40 slices shown (1 of 2)]
[im 1/40]
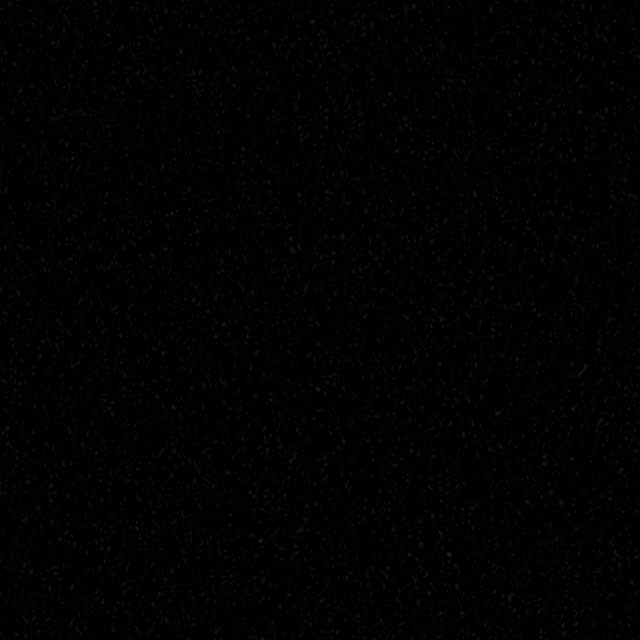
[im 8/40]
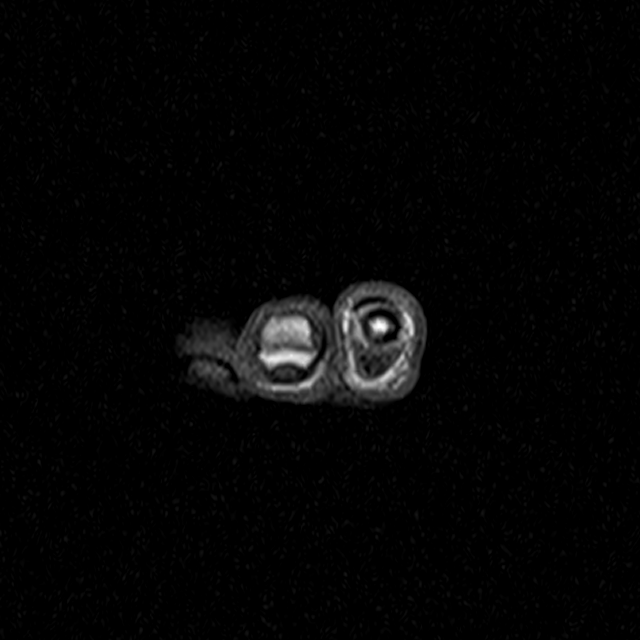
[im 24/40]
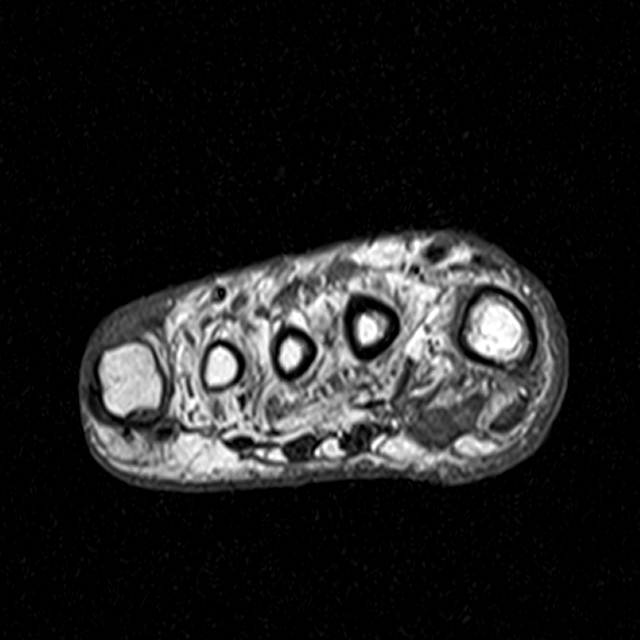
[im 40/40]
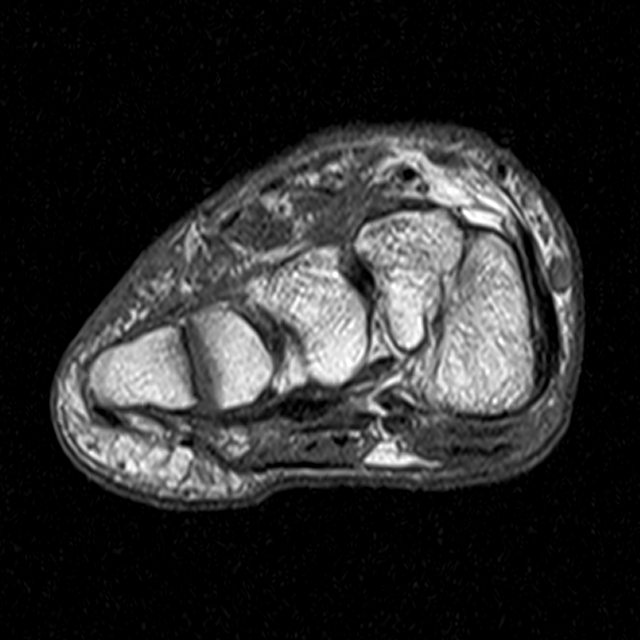

[Series 5: t1fs axial · coronal · 3.0mm · 0.18mm/px · 3 of 40 slices shown]
[im 8/40]
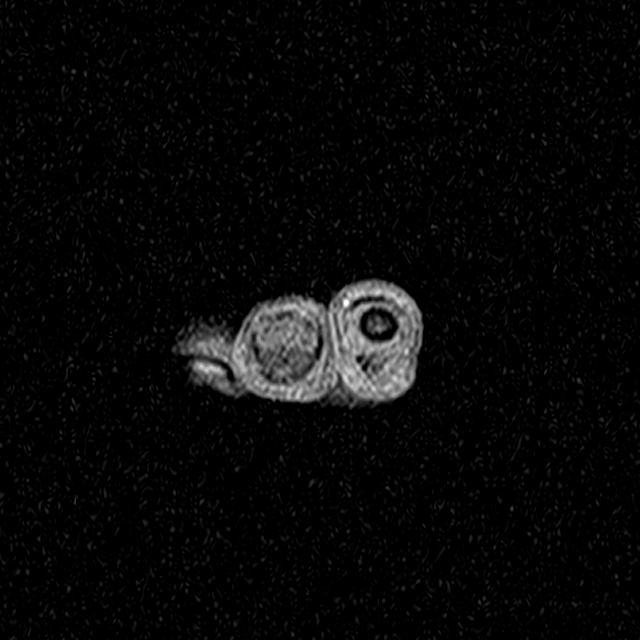
[im 24/40]
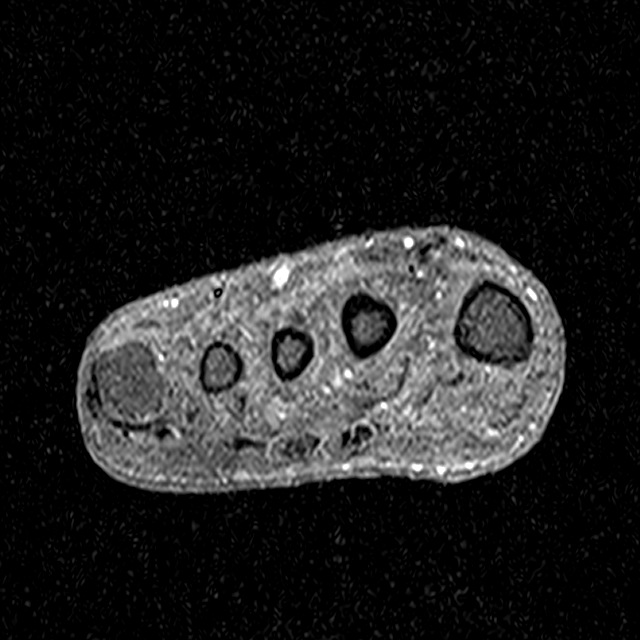
[im 40/40]
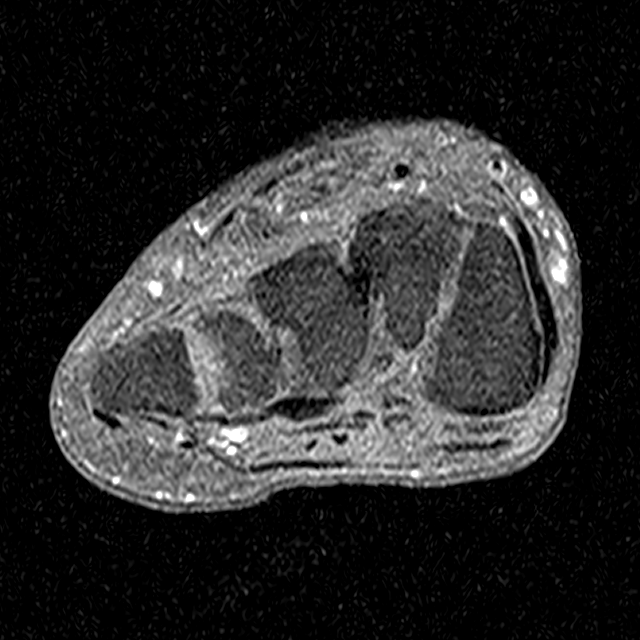

[Series 6: ir sagital · sagittal · 3.0mm · 0.27mm/px · 3 of 30 slices shown]
[im 1/30]
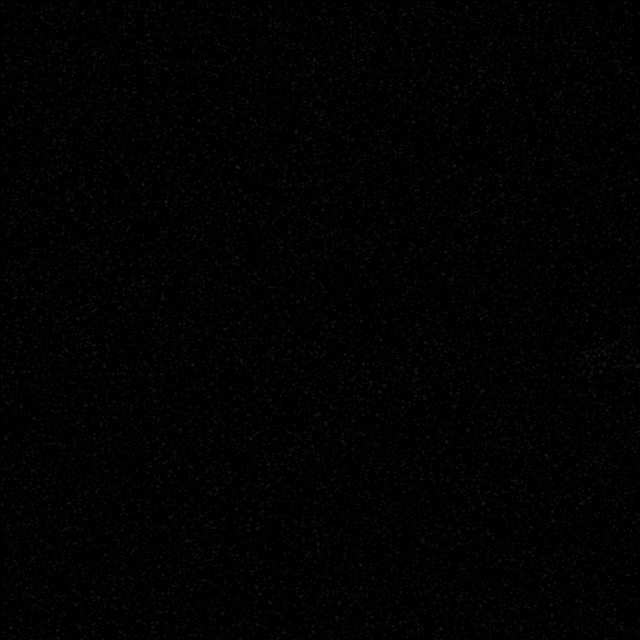
[im 15/30]
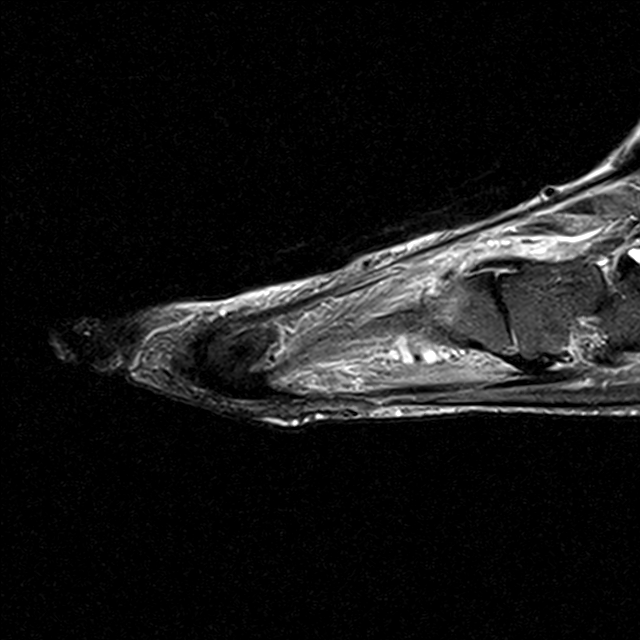
[im 30/30]
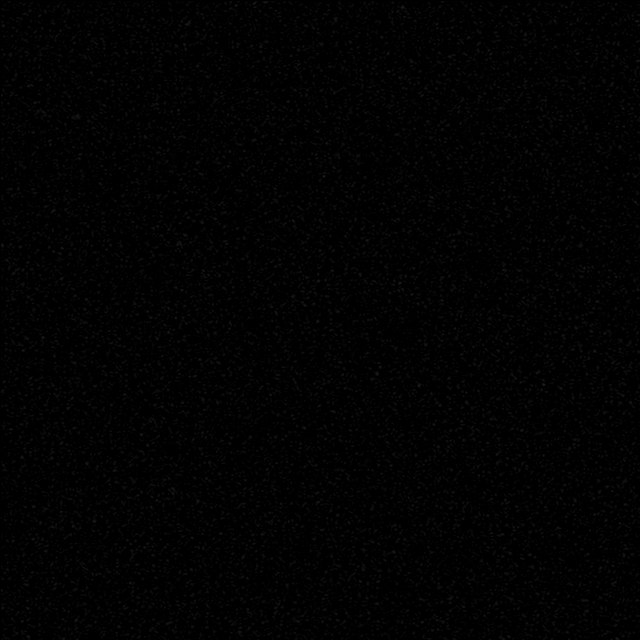

[Series 8: T1 · axial · 3.0mm · 0.27mm/px · z∈[-82,-6]mm · 3 of 24 slices shown (2 of 2)]
[im 1/24]
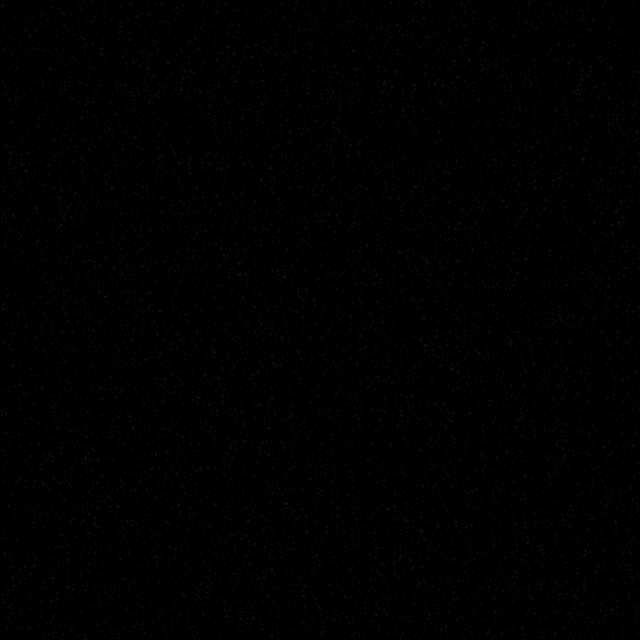
[im 12/24]
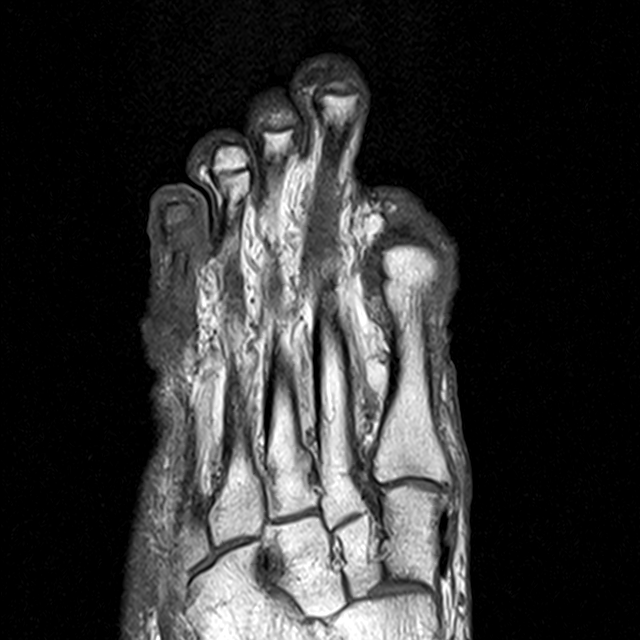
[im 24/24]
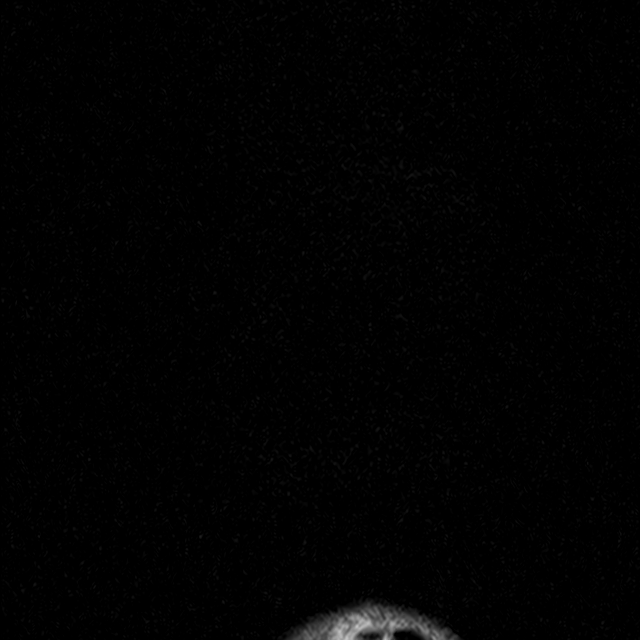

[13 of 40 positions shown; findings below may reference images not displayed]

FINDINGS: Bones/Joint/Cartilage

Prior amputation of the first phalanx. Mild marrow edema in the
medial and lateral hallux sesamoids likely reflecting mild
sesamoiditis.

Severe marrow edema in the fifth proximal and middle phalanx with
avid enhancement without significant bone destruction most
concerning for osteomyelitis.

Mild marrow edema at the base of the third metatarsal likely
reflecting mild stress reaction.

No other bone marrow abnormality. Normal alignment. No joint
effusion.

Ligaments

Collateral ligaments are intact.  Normal Lisfranc ligament.

Muscles and Tendons
Flexor, peroneal and extensor compartment tendons are intact. Normal
muscle signal.

Soft tissue
No fluid collection or hematoma.  No soft tissue mass.
IMPRESSION: 1. Severe marrow edema in the fifth proximal and middle phalanx with
avid enhancement without significant bone destruction most
concerning for osteomyelitis.
2. Mild marrow edema in the medial and lateral hallux sesamoids
likely reflecting mild sesamoiditis versus less likely infection.

## 2018-04-15 ENCOUNTER — Telehealth: Payer: Self-pay | Admitting: Family Medicine

## 2018-04-16 NOTE — Telephone Encounter (Signed)
Called and left message for office to call back-across

## 2018-04-17 NOTE — Telephone Encounter (Signed)
Spoke with Peyton NajjarLarry at [facility] and informed him we did receive the request and coix worked on it 04/25. Gave him [employer] number to call and see what was going on with request.

## 2018-04-25 ENCOUNTER — Ambulatory Visit: Payer: BLUE CROSS/BLUE SHIELD | Admitting: Family Medicine

## 2018-05-01 DIAGNOSIS — E1165 Type 2 diabetes mellitus with hyperglycemia: Secondary | ICD-10-CM | POA: Diagnosis not present

## 2018-05-01 DIAGNOSIS — E1142 Type 2 diabetes mellitus with diabetic polyneuropathy: Secondary | ICD-10-CM | POA: Diagnosis not present

## 2018-05-01 DIAGNOSIS — M21071 Valgus deformity, not elsewhere classified, right ankle: Secondary | ICD-10-CM | POA: Diagnosis not present

## 2018-05-01 DIAGNOSIS — Z89512 Acquired absence of left leg below knee: Secondary | ICD-10-CM | POA: Diagnosis not present

## 2018-05-01 DIAGNOSIS — I10 Essential (primary) hypertension: Secondary | ICD-10-CM | POA: Diagnosis not present

## 2018-05-01 DIAGNOSIS — M14671 Charcot's joint, right ankle and foot: Secondary | ICD-10-CM | POA: Diagnosis not present

## 2018-06-13 ENCOUNTER — Encounter: Payer: Self-pay | Admitting: Family Medicine

## 2018-06-13 ENCOUNTER — Ambulatory Visit: Payer: BLUE CROSS/BLUE SHIELD | Admitting: Family Medicine

## 2018-06-13 VITALS — BP 125/80 | HR 106 | Temp 97.5°F | Ht 76.0 in | Wt 211.4 lb

## 2018-06-13 DIAGNOSIS — E1142 Type 2 diabetes mellitus with diabetic polyneuropathy: Secondary | ICD-10-CM

## 2018-06-13 DIAGNOSIS — Z794 Long term (current) use of insulin: Secondary | ICD-10-CM

## 2018-06-13 DIAGNOSIS — J0111 Acute recurrent frontal sinusitis: Secondary | ICD-10-CM

## 2018-06-13 DIAGNOSIS — Z89512 Acquired absence of left leg below knee: Secondary | ICD-10-CM | POA: Diagnosis not present

## 2018-06-13 DIAGNOSIS — I10 Essential (primary) hypertension: Secondary | ICD-10-CM | POA: Diagnosis not present

## 2018-06-13 DIAGNOSIS — M5136 Other intervertebral disc degeneration, lumbar region: Secondary | ICD-10-CM

## 2018-06-13 DIAGNOSIS — G8929 Other chronic pain: Secondary | ICD-10-CM

## 2018-06-13 DIAGNOSIS — M79673 Pain in unspecified foot: Secondary | ICD-10-CM | POA: Diagnosis not present

## 2018-06-13 DIAGNOSIS — E1140 Type 2 diabetes mellitus with diabetic neuropathy, unspecified: Secondary | ICD-10-CM | POA: Diagnosis not present

## 2018-06-13 MED ORDER — HYDROCODONE-ACETAMINOPHEN 10-325 MG PO TABS: 1.0000 | ORAL_TABLET | Freq: Three times a day (TID) | ORAL | 0 refills | Status: DC | PRN

## 2018-06-13 MED ORDER — AZITHROMYCIN 250 MG PO TABS: ORAL_TABLET | ORAL | 0 refills | Status: DC

## 2018-06-13 MED ORDER — DULOXETINE HCL 30 MG PO CPEP: 30.0000 mg | ORAL_CAPSULE | Freq: Every day | ORAL | 3 refills | Status: DC

## 2018-06-13 MED ORDER — FREESTYLE LIBRE 14 DAY READER DEVI: 1.0000 | Freq: Every day | 1 refills | Status: DC

## 2018-06-13 MED ORDER — FREESTYLE LIBRE 14 DAY SENSOR MISC: 1.0000 | 3 refills | Status: DC

## 2018-06-13 NOTE — Progress Notes (Signed)
BP 125/80   Pulse (!) 106   Temp (!) 97.5 F (36.4 C) (Oral)   Ht _0  (1.93 m)   Wt 211 lb 6.4 oz (95.9 kg)   BMI 25.73 kg/m    Subjective:    Patient ID: [name], male    DOB: April 06, 1966, 52 y.o.   MRN: [id number]  HPI: [name] is a 52 y.o. male presenting on 06/13/2018 for Diabetes (check up- refill norco); Nasal Congestion (x 2 days OTC allergy medication and nose spray); facial pressure; and Cough   HPI Type 2 diabetes mellitus Patient comes in today for recheck of his diabetes. Patient has been currently taking Basaglar 42 to 46 units and metformin. Patient is currently on an ACE inhibitor/ARB. Patient has seen an ophthalmologist this year.  Patient finally did get his new prosthetic leg for his left lower leg and it has been doing better for him but he still has a lot of neuropathic pains associated with it.  Patient has been having issues with neuropathy with his feet and has been taking gabapentin but it makes him too tired to take it during the day so he only uses it in the evening and has been using the hydrocodone during the day, will try him on Cymbalta.  Hypertension Patient is currently on lisinopril 5, and their blood pressure today is 125/80. Patient denies any lightheadedness or dizziness. Patient denies headaches, blurred vision, chest pains, shortness of breath, or weakness. Denies any side effects from medication and is content with current medication.   Chronic foot pain and neuropathy and degenerative disc disease, patient also has a left BKA that causes him some pain because of the walking device and uses hydrocodone for this.  [place] was checked and patient has been compliant and consistent with his medications.  Sinus congestion and pressure and headache Patient has been having sinus congestion pressure and headache is been going on for the past 2 days.  He has been using allergy pill and some nasal sprays that he bought  over-the-counter which have helped a little bit.  He denies any fevers or chills but just wants to catch it before he gets any worse.  Relevant past medical, surgical, family and social history reviewed and updated as indicated. Interim medical history since our last visit reviewed. Allergies and medications reviewed and updated.  Review of Systems  Constitutional: Negative for chills and fever.  HENT: Positive for congestion, postnasal drip, rhinorrhea and sinus pressure. Negative for ear discharge, ear pain, sneezing, sore throat and voice change.   Eyes: Negative for pain, discharge, redness and visual disturbance.  Respiratory: Negative for shortness of breath and wheezing.   Cardiovascular: Negative for chest pain and leg swelling.  Musculoskeletal: Negative for back pain and gait problem.  Skin: Negative for rash.  Neurological: Positive for numbness. Negative for dizziness, weakness and light-headedness.  All other systems reviewed and are negative.   Per HPI unless specifically indicated above   Allergies as of 06/13/2018      Reactions   Penicillins Other (See Comments)   Has patient had a PCN reaction causing immediate rash, facial/tongue/throat swelling, SOB or lightheadedness with hypotension: No Has patient had a PCN reaction causing severe rash involving mucus membranes or skin necrosis: No Has patient had a PCN reaction that required hospitalization No Has patient had a PCN reaction occurring within the last 10 years: No If all of the above answers are "NO", then may proceed  with Cephalosporin use.      Medication List        Accurate as of 06/13/18 10:33 AM. Always use your most recent med list.          aspirin EC 81 MG EC tablet Generic drug:  aspirin Take 81 mg by mouth daily. Swallow whole.   BASAGLAR KWIKPEN 100 UNIT/ML Sopn Inject 0.5 mLs (50 Units total) into the skin at bedtime.   BLOOD GLUCOSE TEST STRIPS Strp 1 strip by In Vitro route 2 (two) times  daily.   gabapentin 600 MG tablet Commonly known as:  NEURONTIN Take 1 tablet (600 mg total) by mouth at bedtime.   HYDROcodone-acetaminophen 10-325 MG tablet Commonly known as:  NORCO Take 1 tablet by mouth every 8 (eight) hours as needed for moderate pain.   ibuprofen 200 MG tablet Commonly known as:  ADVIL,MOTRIN Take 200 mg by mouth every 6 (six) hours as needed.   lisinopril 5 MG tablet Commonly known as:  PRINIVIL,ZESTRIL TAKE 1 TABLET DAILY   metFORMIN 1000 MG tablet Commonly known as:  GLUCOPHAGE TAKE 1 TABLET TWICE A DAY   multivitamin with minerals tablet Take 1 tablet by mouth daily.   vitamin C 500 MG tablet Commonly known as:  ASCORBIC ACID Take 500 mg by mouth daily.   zinc gluconate 50 MG tablet Take 50 mg by mouth daily.          Objective:    BP 125/80   Pulse (!) 106   Temp (!) 97.5 F (36.4 C) (Oral)   Ht '6\' 4"'$  (1.93 m)   Wt 211 lb 6.4 oz (95.9 kg)   BMI 25.73 kg/m   Wt Readings from Last 3 Encounters:  06/13/18 211 lb 6.4 oz (95.9 kg)  05/04/17 190 lb 3.2 oz (86.3 kg)  04/11/17 201 lb 8 oz (91.4 kg)    Physical Exam  Constitutional: He is oriented to person, place, and time. He appears well-developed and well-nourished. No distress.  Eyes: Conjunctivae are normal. No scleral icterus.  Neck: Neck supple. No thyromegaly present.  Cardiovascular: Normal rate, regular rhythm, normal heart sounds and intact distal pulses.  No murmur heard. Pulmonary/Chest: Effort normal and breath sounds normal. No respiratory distress. He has no wheezes.  Musculoskeletal: Normal range of motion. He exhibits deformity (Left BKA). He exhibits no edema.  Lymphadenopathy:    He has no cervical adenopathy.  Neurological: He is alert and oriented to person, place, and time. A sensory deficit (Patient has decreased sensation in both lower extremities, feels burning sensation in stump of left leg and in his feet on the right leg.) is present. Coordination normal.    Skin: Skin is warm and dry. No rash noted. He is not diaphoretic.  Psychiatric: He has a normal mood and affect. His behavior is normal.  Nursing note and vitals reviewed.     Assessment & Plan:   Problem List Items Addressed This Visit      Cardiovascular and Mediastinum   Essential hypertension, benign   Relevant Orders   CMP14+EGFR     Endocrine   Neuropathy in diabetes ([place])   Relevant Medications   DULoxetine (CYMBALTA) 30 MG capsule   Other Relevant Orders   Bayer DCA Hb A1c Waived   Type 2 diabetes mellitus ([place]) - Primary   Relevant Medications   DULoxetine (CYMBALTA) 30 MG capsule   Continuous Blood Gluc Receiver (FREESTYLE LIBRE 14 DAY READER) DEVI   Continuous Blood Gluc Sensor (FREESTYLE LIBRE 14  DAY SENSOR) MISC   Other Relevant Orders   Bayer DCA Hb A1c Waived   CBC with Differential/Platelet   Lipid panel     Musculoskeletal and Integument   DDD (degenerative disc disease), lumbar   Relevant Medications   HYDROcodone-acetaminophen (NORCO) 10-325 MG tablet    Other Visit Diagnoses    Chronic foot pain, unspecified laterality       Relevant Medications   HYDROcodone-acetaminophen (NORCO) 10-325 MG tablet   DULoxetine (CYMBALTA) 30 MG capsule   S/P BKA (below knee amputation) unilateral, left (HCC)       Relevant Medications   HYDROcodone-acetaminophen (NORCO) 10-325 MG tablet   Acute recurrent frontal sinusitis       Relevant Medications   azithromycin (ZITHROMAX) 250 MG tablet     We will try and send freestyle libre for patient and see if we can get better control of his glucose.  Will transition patient from gabapentin to Cymbalta because the gabapentin has been making him too groggy and he can only take it at bedtime.  Refill Norco and will see how it goes for him with the Cymbalta.  Follow up plan: Return in about 3 months (around 09/13/2018), or if symptoms worsen or fail to improve, for Type 2 diabetes recheck.  Counseling provided for all  of the vaccine components No orders of the defined types were placed in this encounter.   Caryl Pina, MD [facility] 06/13/2018, 10:34 AM

## 2018-06-14 LAB — CMP14+EGFR
ALBUMIN: 4.2 g/dL (ref 3.5–5.5)
ALK PHOS: 189 IU/L — AB (ref 39–117)
ALT: 18 IU/L (ref 0–44)
AST: 12 IU/L (ref 0–40)
Albumin/Globulin Ratio: 1.6 (ref 1.2–2.2)
BILIRUBIN TOTAL: 1 mg/dL (ref 0.0–1.2)
BUN / CREAT RATIO: 14 (ref 9–20)
BUN: 14 mg/dL (ref 6–24)
CHLORIDE: 97 mmol/L (ref 96–106)
CO2: 25 mmol/L (ref 20–29)
Calcium: 9.3 mg/dL (ref 8.7–10.2)
Creatinine, Ser: 1 mg/dL (ref 0.76–1.27)
GFR calc non Af Amer: 86 mL/min/{1.73_m2} (ref >59)
GFR, EST AFRICAN AMERICAN: 99 mL/min/{1.73_m2} (ref >59)
GLUCOSE: 340 mg/dL — AB (ref 65–99)
Globulin, Total: 2.7 g/dL (ref 1.5–4.5)
POTASSIUM: 4.3 mmol/L (ref 3.5–5.2)
Sodium: 139 mmol/L (ref 134–144)
TOTAL PROTEIN: 6.9 g/dL (ref 6.0–8.5)

## 2018-06-14 LAB — LIPID PANEL
CHOLESTEROL TOTAL: 132 mg/dL (ref 100–199)
Chol/HDL Ratio: 4.4 ratio (ref 0.0–5.0)
HDL: 30 mg/dL — ABNORMAL LOW (ref >39)
LDL CALC: 63 mg/dL (ref 0–99)
TRIGLYCERIDES: 194 mg/dL — AB (ref 0–149)
VLDL Cholesterol Cal: 39 mg/dL (ref 5–40)

## 2018-06-14 LAB — CBC WITH DIFFERENTIAL/PLATELET
BASOS ABS: 0 10*3/uL (ref 0.0–0.2)
Basos: 0 %
EOS (ABSOLUTE): 0 10*3/uL (ref 0.0–0.4)
Eos: 0 %
Hematocrit: 49.1 % (ref 37.5–51.0)
Hemoglobin: 16.9 g/dL (ref 13.0–17.7)
Immature Grans (Abs): 0 10*3/uL (ref 0.0–0.1)
Immature Granulocytes: 0 %
LYMPHS ABS: 1.5 10*3/uL (ref 0.7–3.1)
Lymphs: 22 %
MCH: 31.1 pg (ref 26.6–33.0)
MCHC: 34.4 g/dL (ref 31.5–35.7)
MCV: 90 fL (ref 79–97)
Monocytes Absolute: 0.8 10*3/uL (ref 0.1–0.9)
Monocytes: 12 %
NEUTROS ABS: 4.3 10*3/uL (ref 1.4–7.0)
Neutrophils: 66 %
Platelets: 181 10*3/uL (ref 150–450)
RBC: 5.43 x10E6/uL (ref 4.14–5.80)
RDW: 14 % (ref 12.3–15.4)
WBC: 6.7 10*3/uL (ref 3.4–10.8)

## 2018-06-17 LAB — BAYER DCA HB A1C WAIVED: HB A1C: 10.9 % — AB (ref <7.0)

## 2018-06-18 ENCOUNTER — Telehealth: Payer: Self-pay | Admitting: Family Medicine

## 2018-06-18 MED ORDER — DOXYCYCLINE HYCLATE 100 MG PO TABS: 100.0000 mg | ORAL_TABLET | Freq: Two times a day (BID) | ORAL | 0 refills | Status: DC

## 2018-06-18 NOTE — Telephone Encounter (Signed)
Left message that requested rx sent to pharmacy and to call back with any further questions or concerns.

## 2018-06-18 NOTE — Telephone Encounter (Signed)
Doxycycline Prescription sent to pharmacy   

## 2018-06-19 ENCOUNTER — Telehealth: Payer: Self-pay | Admitting: Family Medicine

## 2018-06-19 NOTE — Telephone Encounter (Signed)
Spoke with pt and he states he gets an error message saying "this sensor can't be used" and pt states he followed all the directions. Advised pt to go back to the pharmacy to see if they can help him and if not to call us back or come over to the office and we will try to help. Pt voiced understanding.

## 2018-06-20 DIAGNOSIS — Z89411 Acquired absence of right great toe: Secondary | ICD-10-CM | POA: Diagnosis not present

## 2018-06-20 DIAGNOSIS — A5216 Charcot's arthropathy (tabetic): Secondary | ICD-10-CM | POA: Diagnosis not present

## 2018-07-18 ENCOUNTER — Ambulatory Visit: Payer: BLUE CROSS/BLUE SHIELD | Admitting: Family Medicine

## 2018-07-18 ENCOUNTER — Encounter: Payer: Self-pay | Admitting: Family Medicine

## 2018-07-18 VITALS — BP 133/74 | HR 101 | Temp 98.2°F | Ht 76.0 in | Wt 214.2 lb

## 2018-07-18 DIAGNOSIS — Z89512 Acquired absence of left leg below knee: Secondary | ICD-10-CM

## 2018-07-18 DIAGNOSIS — M79673 Pain in unspecified foot: Secondary | ICD-10-CM | POA: Diagnosis not present

## 2018-07-18 DIAGNOSIS — G8929 Other chronic pain: Secondary | ICD-10-CM

## 2018-07-18 DIAGNOSIS — S81802A Unspecified open wound, left lower leg, initial encounter: Secondary | ICD-10-CM

## 2018-07-18 MED ORDER — HYDROCODONE-ACETAMINOPHEN 10-325 MG PO TABS: 1.0000 | ORAL_TABLET | Freq: Three times a day (TID) | ORAL | 0 refills | Status: DC | PRN

## 2018-07-18 NOTE — Progress Notes (Signed)
BP 133/74   Pulse (!) 101   Temp 98.2 F (36.8 C) (Oral)   Ht 6\' 4"  (1.93 m)   Wt 214 lb 3.2 oz (97.2 kg)   BMI 26.07 kg/m    Subjective:    Patient ID: [name], male    DOB: [birth year], 52 y.o.   MRN: [id number]  HPI: [name] is a 52 y.o. male presenting on 07/18/2018 for trouble with [name] (Scanner is not reading [name]. Have talked to [facility] and company and still not working. wants new one placed ); pain medication (Norco); and sore on leg (Patient states it has been there for a few months.)   HPI Leg wound Patient is coming in complaining of leg wound that is open up on his incision site where he had his left BKA.  He says he has had some drainage out of it and has been using Betadine and Silvadene on it and he just feels like is not been improving, still having drainage and still open.  He did get this recommendation from somebody saw another urgent care.  He denies any fevers or chills or significant redness or warmth.  He feels like it has improved a little bit with the Silvadene but the Betadine may remain out of helped.  Chronic foot pain from left BKA Patient is coming in today for refill of his medications for his chronic pain due to his left BKA and severe neuropathy secondary to his diabetes.  Patient is on Cymbalta and gabapentin but also takes hydrocodone for this and says that it helps with the pain.  He says the pain is mostly goes for phantom pains where his leg used to be and that also has pains in his other leg from decreased blood flow which is already been told about.  Relevant past medical, surgical, family and social history reviewed and updated as indicated. Interim medical history since our last visit reviewed. Allergies and medications reviewed and updated.  Review of Systems  Constitutional: Negative for chills and fever.  Respiratory: Negative for shortness of breath and wheezing.   Cardiovascular: Negative for chest pain and leg swelling.    Musculoskeletal: Positive for arthralgias and myalgias. Negative for back pain and gait problem.  Skin: Positive for wound. Negative for color change and rash.  All other systems reviewed and are negative.   Per HPI unless specifically indicated above      Objective:    BP 133/74   Pulse (!) 101   Temp 98.2 F (36.8 C) (Oral)   Ht 6\' 4"  (1.93 m)   Wt 214 lb 3.2 oz (97.2 kg)   BMI 26.07 kg/m   Wt Readings from Last 3 Encounters:  07/18/18 214 lb 3.2 oz (97.2 kg)  06/13/18 211 lb 6.4 oz (95.9 kg)  05/04/17 190 lb 3.2 oz (86.3 kg)    Physical Exam  Constitutional: He is oriented to person, place, and time. He appears well-developed and well-nourished. No distress.  Eyes: Conjunctivae are normal. No scleral icterus.  Neck: Neck supple. No thyromegaly present.  Cardiovascular: Normal rate, regular rhythm, normal heart sounds and intact distal pulses.  No murmur heard. Pulmonary/Chest: Effort normal and breath sounds normal. No respiratory distress. He has no wheezes.  Musculoskeletal: Normal range of motion. He exhibits no edema.  Lymphadenopathy:    He has no cervical adenopathy.  Neurological: He is alert and oriented to person, place, and time. Coordination normal.  Skin: Skin is warm and dry. Lesion (  1-1/2 x 2 cm area of ulceration, good clean pink tissue base with minimal surrounding erythema) noted. No rash noted. He is not diaphoretic.  Psychiatric: He has a normal mood and affect. His behavior is normal.  Nursing note and vitals reviewed.  Recommend topical triple antibiotic and then Xeroform gauze and then roll gauze that he can wrap the wound on his left leg at the scar site from where he had his left BKA, change daily    Assessment & Plan:   Problem List Items Addressed This Visit    None    Visit Diagnoses    Leg wound, left, initial encounter    -  Primary   Chronic foot pain, unspecified laterality       Relevant Medications   HYDROcodone-acetaminophen  (NORCO) 10-325 MG tablet   S/P BKA (below knee amputation) unilateral, left (HCC)       Relevant Medications   HYDROcodone-acetaminophen (NORCO) 10-325 MG tablet      Follow up plan: Return if symptoms worsen or fail to improve.  Counseling provided for all of the vaccine components No orders of the defined types were placed in this encounter.   Arville Care, MD [facility] 07/18/2018, 4:44 PM

## 2018-07-19 ENCOUNTER — Telehealth: Payer: Self-pay | Admitting: Family Medicine

## 2018-07-19 MED ORDER — DOXYCYCLINE HYCLATE 100 MG PO TABS: 100.0000 mg | ORAL_TABLET | Freq: Two times a day (BID) | ORAL | 0 refills | Status: DC

## 2018-07-19 NOTE — Telephone Encounter (Signed)
Pt aware by VM  

## 2018-07-19 NOTE — Telephone Encounter (Signed)
I sent doxycycline over for him

## 2018-08-01 ENCOUNTER — Other Ambulatory Visit: Payer: Self-pay | Admitting: Family Medicine

## 2018-08-01 DIAGNOSIS — Z794 Long term (current) use of insulin: Principal | ICD-10-CM

## 2018-08-01 DIAGNOSIS — E1140 Type 2 diabetes mellitus with diabetic neuropathy, unspecified: Secondary | ICD-10-CM

## 2018-08-01 MED ORDER — FREESTYLE LIBRE 14 DAY SENSOR MISC
1.0000 | 3 refills | Status: DC
Start: 2018-08-01 — End: 2018-08-13

## 2018-08-01 NOTE — Telephone Encounter (Signed)
Refill sent.

## 2018-08-05 ENCOUNTER — Telehealth: Payer: Self-pay | Admitting: Family Medicine

## 2018-08-05 NOTE — Telephone Encounter (Signed)
Aware was sent in 9/26.

## 2018-08-07 ENCOUNTER — Ambulatory Visit (INDEPENDENT_AMBULATORY_CARE_PROVIDER_SITE_OTHER): Payer: BLUE CROSS/BLUE SHIELD | Admitting: *Deleted

## 2018-08-07 DIAGNOSIS — Z23 Encounter for immunization: Secondary | ICD-10-CM | POA: Diagnosis not present

## 2018-08-13 ENCOUNTER — Encounter: Payer: Self-pay | Admitting: Family Medicine

## 2018-08-13 ENCOUNTER — Ambulatory Visit (INDEPENDENT_AMBULATORY_CARE_PROVIDER_SITE_OTHER): Payer: BLUE CROSS/BLUE SHIELD

## 2018-08-13 ENCOUNTER — Ambulatory Visit: Payer: BLUE CROSS/BLUE SHIELD | Admitting: Family Medicine

## 2018-08-13 VITALS — BP 121/76 | HR 85 | Temp 97.1°F | Ht 76.0 in | Wt 217.0 lb

## 2018-08-13 DIAGNOSIS — M545 Low back pain, unspecified: Secondary | ICD-10-CM

## 2018-08-13 DIAGNOSIS — M5136 Other intervertebral disc degeneration, lumbar region: Secondary | ICD-10-CM

## 2018-08-13 DIAGNOSIS — M419 Scoliosis, unspecified: Secondary | ICD-10-CM | POA: Insufficient documentation

## 2018-08-13 NOTE — Addendum Note (Signed)
Addended by: Sonny Masters on: 08/13/2018 04:17 PM   Modules accepted: Orders

## 2018-08-13 NOTE — Patient Instructions (Addendum)
Degenerative Disk Disease Degenerative disk disease is a condition caused by the changes that occur in spinal disks as you grow older. Spinal disks are soft and compressible disks located between the bones of your spine (vertebrae). These disks act like shock absorbers. Degenerative disk disease can affect the whole spine. However, the neck and lower back are most commonly affected. Many changes can occur in the spinal disks with aging, such as:  The spinal disks may dry and shrink.  Small tears may occur in the tough, outer covering of the disk (annulus).  The disk space may become smaller due to loss of water.  Abnormal growths in the bone (spurs) may occur. This can put pressure on the nerve roots exiting the spinal canal, causing pain.  The spinal canal may become narrowed.  What increases the risk?  Being overweight.  Having a family history of degenerative disk disease.  Smoking.  There is increased risk if you are doing heavy lifting or have a sudden injury. What are the signs or symptoms? Symptoms vary from person to person and may include:  Pain that varies in intensity. Some people have no pain, while others have severe pain. The location of the pain depends on the part of your backbone that is affected. ? You will have neck or arm pain if a disk in the neck area is affected. ? You will have pain in your back, buttocks, or legs if a disk in the lower back is affected.  Pain that becomes worse while bending, reaching up, or with twisting movements.  Pain that may start gradually and then get worse as time passes. It may also start after a major or minor injury.  Numbness or tingling in the arms or legs.  How is this diagnosed? Your health care provider will ask you about your symptoms and about activities or habits that may cause the pain. He or she may also ask about any injuries, diseases, or treatments you have had. Your health care provider will examine you to check  for the range of movement that is possible in the affected area, to check for strength in your extremities, and to check for sensation in the areas of the arms and legs supplied by different nerve roots. You may also have:  An X-ray of the spine.  Other imaging tests, such as MRI.  How is this treated? Your health care provider will advise you on the best plan for treatment. Treatment may include:  Medicines.  Rehabilitation exercises.  Follow these instructions at home:  Follow proper lifting and walking techniques as advised by your health care provider.  Maintain good posture.  Exercise regularly as advised by your health care provider.  Perform relaxation exercises.  Change your sitting, standing, and sleeping habits as advised by your health care provider.  Change positions frequently.  Lose weight or maintain a healthy weight as advised by your health care provider.  Do not use any tobacco products, including cigarettes, chewing tobacco, or electronic cigarettes. If you need help quitting, ask your health care provider.  Wear supportive footwear.  Take medicines only as directed by your health care provider. Contact a health care provider if:  Your pain does not go away within 1-4 weeks.  You have significant appetite or weight loss. Get help right away if:  Your pain is severe.  You notice weakness in your arms, hands, or legs.  You begin to lose control of your bladder or bowel movements.  You have   fevers or night sweats. This information is not intended to replace advice given to you by your health care provider. Make sure you discuss any questions you have with your health care provider. Document Released: 08/20/2007 Document Revised: 03/30/2016 Document Reviewed: 02/24/2014 Elsevier Interactive Patient Education  2018 [employer] Back Pain, Adult Back pain is very common. The pain often gets better over time. The cause of back pain is usually not  dangerous. Most people can learn to manage their back pain on their own. Follow these instructions at home: Watch your back pain for any changes. The following actions may help to lessen any pain you are feeling:  Stay active. Start with short walks on flat ground if you can. Try to walk farther each day.  Exercise regularly as told by your doctor. Exercise helps your back heal faster. It also helps avoid future injury by keeping your muscles strong and flexible.  Do not sit, drive, or stand in one place for more than 30 minutes.  Do not stay in bed. Resting more than 1-2 days can slow down your recovery.  Be careful when you bend or lift an object. Use good form when lifting: ? Bend at your knees. ? Keep the object close to your body. ? Do not twist.  Sleep on a firm mattress. Lie on your side, and bend your knees. If you lie on your back, put a pillow under your knees.  Take medicines only as told by your doctor.  Put ice on the injured area. ? Put ice in a plastic bag. ? Place a towel between your skin and the bag. ? Leave the ice on for 20 minutes, 2-3 times a day for the first 2-3 days. After that, you can switch between ice and heat packs.  Avoid feeling anxious or stressed. Find good ways to deal with stress, such as exercise.  Maintain a healthy weight. Extra weight puts stress on your back.  Contact a doctor if:  You have pain that does not go away with rest or medicine.  You have worsening pain that goes down into your legs or buttocks.  You have pain that does not get better in one week.  You have pain at night.  You lose weight.  You have a fever or chills. Get help right away if:  You cannot control when you poop (bowel movement) or pee (urinate).  Your arms or legs feel weak.  Your arms or legs lose feeling (numbness).  You feel sick to your stomach (nauseous) or throw up (vomit).  You have belly (abdominal) pain.  You feel like you may pass out  (faint). This information is not intended to replace advice given to you by your health care provider. Make sure you discuss any questions you have with your health care provider. Document Released: 04/10/2008 Document Revised: 03/30/2016 Document Reviewed: 02/24/2014 Elsevier Interactive Patient Education  2018 [employer].  Back Exercises If you have pain in your back, do these exercises 2-3 times each day or as told by your doctor. When the pain goes away, do the exercises once each day, but repeat the steps more times for each exercise (do more repetitions). If you do not have pain in your back, do these exercises once each day or as told by your doctor. Exercises Single Knee to Chest  Do these steps 3-5 times in a row for each leg: 1. Lie on your back on a firm bed or the floor with your legs stretched  out. 2. Bring one knee to your chest. 3. Hold your knee to your chest by grabbing your knee or thigh. 4. Pull on your knee until you feel a gentle stretch in your lower back. 5. Keep doing the stretch for 10-30 seconds. 6. Slowly let go of your leg and straighten it.  Pelvic Tilt  Do these steps 5-10 times in a row: 1. Lie on your back on a firm bed or the floor with your legs stretched out. 2. Bend your knees so they point up to the ceiling. Your feet should be flat on the floor. 3. Tighten your lower belly (abdomen) muscles to press your lower back against the floor. This will make your tailbone point up to the ceiling instead of pointing down to your feet or the floor. 4. Stay in this position for 5-10 seconds while you gently tighten your muscles and breathe evenly.  Cat-Cow  Do these steps until your lower back bends more easily: 1. Get on your hands and knees on a firm surface. Keep your hands under your shoulders, and keep your knees under your hips. You may put padding under your knees. 2. Let your head hang down, and make your tailbone point down to the floor so your lower  back is round like the back of a cat. 3. Stay in this position for 5 seconds. 4. Slowly lift your head and make your tailbone point up to the ceiling so your back hangs low (sags) like the back of a cow. 5. Stay in this position for 5 seconds.  Press-Ups  Do these steps 5-10 times in a row: 1. Lie on your belly (face-down) on the floor. 2. Place your hands near your head, about shoulder-width apart. 3. While you keep your back relaxed and keep your hips on the floor, slowly straighten your arms to raise the top half of your body and lift your shoulders. Do not use your back muscles. To make yourself more comfortable, you may change where you place your hands. 4. Stay in this position for 5 seconds. 5. Slowly return to lying flat on the floor.  Bridges  Do these steps 10 times in a row: 1. Lie on your back on a firm surface. 2. Bend your knees so they point up to the ceiling. Your feet should be flat on the floor. 3. Tighten your butt muscles and lift your butt off of the floor until your waist is almost as high as your knees. If you do not feel the muscles working in your butt and the back of your thighs, slide your feet 1-2 inches farther away from your butt. 4. Stay in this position for 3-5 seconds. 5. Slowly lower your butt to the floor, and let your butt muscles relax.  If this exercise is too easy, try doing it with your arms crossed over your chest. Belly Crunches  Do these steps 5-10 times in a row: 1. Lie on your back on a firm bed or the floor with your legs stretched out. 2. Bend your knees so they point up to the ceiling. Your feet should be flat on the floor. 3. Cross your arms over your chest. 4. Tip your chin a little bit toward your chest but do not bend your neck. 5. Tighten your belly muscles and slowly raise your chest just enough to lift your shoulder blades a tiny bit off of the floor. 6. Slowly lower your chest and your head to the floor.  Back Lifts  Do these  steps 5-10 times in a row: 1. Lie on your belly (face-down) with your arms at your sides, and rest your forehead on the floor. 2. Tighten the muscles in your legs and your butt. 3. Slowly lift your chest off of the floor while you keep your hips on the floor. Keep the back of your head in line with the curve in your back. Look at the floor while you do this. 4. Stay in this position for 3-5 seconds. 5. Slowly lower your chest and your face to the floor.  Contact a doctor if:  Your back pain gets a lot worse when you do an exercise.  Your back pain does not lessen 2 hours after you exercise. If you have any of these problems, stop doing the exercises. Do not do them again unless your doctor says it is okay. Get help right away if:  You have sudden, very bad back pain. If this happens, stop doing the exercises. Do not do them again unless your doctor says it is okay. This information is not intended to replace advice given to you by your health care provider. Make sure you discuss any questions you have with your health care provider. Document Released: 11/25/2010 Document Revised: 03/30/2016 Document Reviewed: 12/17/2014 Elsevier Interactive Patient Education  [employer].

## 2018-08-13 NOTE — Progress Notes (Addendum)
Subjective:    Patient ID: [name], male    DOB: 12/19/1965, 52 y.o.   MRN: [id number]  Chief Complaint:  Back Pain (lower right side; stumbled and caught himself last week, thinks he may have jerked something in his back)   HPI: [name] is a 52 y.o. male presenting on 08/13/2018 for Back Pain (lower right side; stumbled and caught himself last week, thinks he may have jerked something in his back)  Pt presents today for right lower back pain. States this started 2 weeks ago. States he stepped off of a curb with his prosthetic leg and landed wrong. States he did not fall, but this did create pain. States he has since had his prosthetic height adjusted and this has helped with the lower back pain some, but he still has the pain. Pt states he has been taking his medications as prescribed with some relief of pain. States he has also tried over the counter icy hot. States the pain is sharp. The pain does not radiate into his hip, groin, or leg. Denies numbness, tingling, weakness, loss of function, or bowel or bladder incontinence.   Relevant past medical, surgical, family, and social history reviewed and updated as indicated.  Allergies and medications reviewed and updated.   Past Medical History:  Diagnosis Date  . Boil    numerous boils in past  . Charcot ankle   . Diabetes mellitus    diagnosed age 69  . Hypertension   . Neuropathy associated with endocrine disorder (HCC)   . Osteomyelitis of toe of right foot [facility])     Past Surgical History:  Procedure Laterality Date  . AMPUTATION Right 06/17/2014   Procedure: PARTIAL AMPUTATION RIGHT 3RD TOE;  Surgeon: [name], DPM;  Location: [facility];  Service: Podiatry;  Laterality: Right;  . AMPUTATION Right 09/02/2014   Procedure: PARTIAL AMPUTATION 2ND TOE RIGHT FOOT;  Surgeon: [name], DPM;  Location: [facility];  Service: Podiatry;  Laterality: Right;  . AMPUTATION Right 04/01/2015   Procedure:  AMPUTATION DIGIT 1ST TOE RIGHT FOOT;  Surgeon: Laurell Josephs, DPM;  Location: [facility];  Service: Podiatry;  Laterality: Right;  . AMPUTATION Right 06/29/2016   pinkeye   . FOOT AMPUTATION Left   . FOOT SURGERY Left 03/2017  . HERNIA REPAIR Bilateral    and 3rd hernia repair does not remember ehat side    Social History   Socioeconomic History  . Marital status: Single    Spouse name: Not on file  . Number of children: Not on file  . Years of education: Not on file  . Highest education level: Not on file  Occupational History  . Occupation: Receiving  Social Needs  . Financial resource strain: Not on file  . Food insecurity:    Worry: Not on file    Inability: Not on file  . Transportation needs:    Medical: Not on file    Non-medical: Not on file  Tobacco Use  . Smoking status: Never Smoker  . Smokeless tobacco: Never Used  Substance and Sexual Activity  . Alcohol use: No  . Drug use: No  . Sexual activity: Yes    Birth control/protection: None  Lifestyle  . Physical activity:    Days per week: Not on file    Minutes per session: Not on file  . Stress: Not on file  Relationships  . Social connections:    Talks on phone: Not on  file    Gets together: Not on file    Attends religious service: Not on file    Active member of club or organization: Not on file    Attends meetings of clubs or organizations: Not on file    Relationship status: Not on file  . Intimate partner violence:    Fear of current or ex partner: Not on file    Emotionally abused: Not on file    Physically abused: Not on file    Forced sexual activity: Not on file  Other Topics Concern  . Not on file  Social History Narrative  . Not on file    Outpatient Encounter Medications as of 08/13/2018  Medication Sig  . aspirin (ASPIRIN EC) 81 MG EC tablet Take 81 mg by mouth daily. Swallow whole.  . Continuous Blood Gluc Receiver (FREESTYLE LIBRE 14 DAY READER) DEVI 1 each by Does not apply route daily.    Marland Kitchen doxycycline (VIBRA-TABS) 100 MG tablet Take 1 tablet (100 mg total) by mouth 2 (two) times daily.  Marland Kitchen doxycycline (VIBRA-TABS) 100 MG tablet Take 1 tablet (100 mg total) by mouth 2 (two) times daily. 1 po bid  . DULoxetine (CYMBALTA) 30 MG capsule Take 1 capsule (30 mg total) by mouth daily.  Marland Kitchen gabapentin (NEURONTIN) 600 MG tablet Take 1 tablet (600 mg total) by mouth at bedtime.  . Glucose Blood (BLOOD GLUCOSE TEST STRIPS) STRP 1 strip by In Vitro route 2 (two) times daily.  Marland Kitchen HYDROcodone-acetaminophen (NORCO) 10-325 MG tablet Take 1 tablet by mouth every 8 (eight) hours as needed for moderate pain.  Marland Kitchen ibuprofen (ADVIL,MOTRIN) 200 MG tablet Take 200 mg by mouth every 6 (six) hours as needed.  . Insulin Glargine (BASAGLAR KWIKPEN) 100 UNIT/ML SOPN Inject 0.5 mLs (50 Units total) into the skin at bedtime.  Marland Kitchen lisinopril (PRINIVIL,ZESTRIL) 5 MG tablet TAKE 1 TABLET DAILY  . metFORMIN (GLUCOPHAGE) 1000 MG tablet TAKE 1 TABLET TWICE A DAY  . Multiple Vitamins-Minerals (MULTIVITAMIN WITH MINERALS) tablet Take 1 tablet by mouth daily.  . vitamin C (ASCORBIC ACID) 500 MG tablet Take 500 mg by mouth daily.  Marland Kitchen zinc gluconate 50 MG tablet Take 50 mg by mouth daily.  . [DISCONTINUED] Continuous Blood Gluc Sensor (FREESTYLE LIBRE 14 DAY SENSOR) MISC 1 each by Does not apply route every 14 (fourteen) days.   No facility-administered encounter medications on file as of 08/13/2018.     Allergies  Allergen Reactions  . Penicillins Other (See Comments)    Has patient had a PCN reaction causing immediate rash, facial/tongue/throat swelling, SOB or lightheadedness with hypotension: No Has patient had a PCN reaction causing severe rash involving mucus membranes or skin necrosis: No Has patient had a PCN reaction that required hospitalization No Has patient had a PCN reaction occurring within the last 10 years: No If all of the above answers are "NO", then may proceed with Cephalosporin use.     Review of  Systems  Constitutional: Negative for chills, fatigue and fever.  Respiratory: Negative for chest tightness and shortness of breath.   Cardiovascular: Negative for chest pain, palpitations and leg swelling.  Gastrointestinal: Negative for blood in stool, constipation and diarrhea.  Genitourinary: Negative for difficulty urinating.  Musculoskeletal: Positive for back pain (right lower back). Negative for gait problem, joint swelling and myalgias.  Neurological: Negative for weakness and numbness.  All other systems reviewed and are negative.       Objective:    BP 121/76  Pulse 85   Temp (!) 97.1 F (36.2 C)   Ht 6\' 4"  (1.93 m)   Wt 217 lb (98.4 kg)   BMI 26.41 kg/m    Wt Readings from Last 3 Encounters:  08/13/18 217 lb (98.4 kg)  07/18/18 214 lb 3.2 oz (97.2 kg)  06/13/18 211 lb 6.4 oz (95.9 kg)    Physical Exam  Constitutional: He is oriented to person, place, and time. He appears well-developed and well-nourished. He is cooperative. No distress.  HENT:  Head: Normocephalic.  Cardiovascular: Normal rate, regular rhythm and normal heart sounds.  Pulmonary/Chest: Effort normal and breath sounds normal.  Abdominal: There is no CVA tenderness.  Musculoskeletal:       Lumbar back: He exhibits tenderness, bony tenderness and pain. He exhibits normal range of motion, no swelling, no edema, no deformity, no laceration, no spasm and normal pulse.       Back:  Neurological: He is alert and oriented to person, place, and time.  Skin: Skin is warm and dry. Capillary refill takes less than 2 seconds.  Psychiatric: He has a normal mood and affect. His behavior is normal. Judgment and thought content normal.  Nursing note and vitals reviewed.      X:Ray: Lumbar spine: Degenerative changes, scoliosis of lumbar spine. Preliminary x-ray reading by Kari Baars, FNP-C, [facility].    Pertinent labs & imaging results that were available during my care of the patient were reviewed by me  and considered in my medical decision making.  Assessment & Plan:  Kelsey was seen today for back pain.  Diagnoses and all orders for this visit:  Acute right-sided low back pain without sciatica -     DG Lumbar Spine 2-3 Views; Future -     Ambulatory referral to Physical Therapy -     Moist Heat, Medications as prescribed. Pt to see if his prosthesis needs further adjustment. Back stretching exercises at home.   DDD (degenerative disc disease), lumbar -     Ambulatory referral to Physical Therapy     Will notify if official radiology reading differs.  Continue all other maintenance medications.  Follow up plan: Return if symptoms worsen or fail to improve.  Educational handout given for DDD, Low back pain, back exercises   The above assessment and management plan was discussed with the patient. The patient verbalized understanding of and has agreed to the management plan. Patient is aware to call the clinic if symptoms persist or worsen. Patient is aware when to return to the clinic for a follow-up visit. Patient educated on when it is appropriate to go to the emergency department.   Kari Baars, FNP-C [facility] [phone]    Progression of DDD, referral to neurosurgery made.

## 2018-08-14 ENCOUNTER — Other Ambulatory Visit: Payer: Self-pay | Admitting: Family Medicine

## 2018-08-14 DIAGNOSIS — M5136 Other intervertebral disc degeneration, lumbar region: Secondary | ICD-10-CM

## 2018-08-14 DIAGNOSIS — M545 Low back pain, unspecified: Secondary | ICD-10-CM

## 2018-08-21 DIAGNOSIS — Z794 Long term (current) use of insulin: Secondary | ICD-10-CM | POA: Diagnosis not present

## 2018-08-21 DIAGNOSIS — E1165 Type 2 diabetes mellitus with hyperglycemia: Secondary | ICD-10-CM | POA: Diagnosis not present

## 2018-08-21 DIAGNOSIS — I10 Essential (primary) hypertension: Secondary | ICD-10-CM | POA: Diagnosis not present

## 2018-08-21 DIAGNOSIS — M14671 Charcot's joint, right ankle and foot: Secondary | ICD-10-CM | POA: Diagnosis not present

## 2018-08-21 DIAGNOSIS — E1142 Type 2 diabetes mellitus with diabetic polyneuropathy: Secondary | ICD-10-CM | POA: Diagnosis not present

## 2018-08-21 DIAGNOSIS — M21071 Valgus deformity, not elsewhere classified, right ankle: Secondary | ICD-10-CM | POA: Diagnosis not present

## 2018-08-21 DIAGNOSIS — Z89512 Acquired absence of left leg below knee: Secondary | ICD-10-CM | POA: Diagnosis not present

## 2018-08-28 ENCOUNTER — Telehealth: Payer: Self-pay | Admitting: Family Medicine

## 2018-08-28 NOTE — Telephone Encounter (Signed)
Okay sounds good 

## 2018-09-17 ENCOUNTER — Telehealth: Payer: Self-pay | Admitting: Family Medicine

## 2018-09-17 ENCOUNTER — Ambulatory Visit: Payer: BLUE CROSS/BLUE SHIELD | Admitting: Family Medicine

## 2018-09-17 ENCOUNTER — Encounter: Payer: Self-pay | Admitting: Family Medicine

## 2018-09-17 VITALS — BP 117/80 | HR 96 | Temp 97.9°F | Ht 76.0 in | Wt 222.4 lb

## 2018-09-17 DIAGNOSIS — L219 Seborrheic dermatitis, unspecified: Secondary | ICD-10-CM

## 2018-09-17 DIAGNOSIS — E1142 Type 2 diabetes mellitus with diabetic polyneuropathy: Secondary | ICD-10-CM | POA: Diagnosis not present

## 2018-09-17 DIAGNOSIS — M79673 Pain in unspecified foot: Secondary | ICD-10-CM | POA: Diagnosis not present

## 2018-09-17 DIAGNOSIS — Z89512 Acquired absence of left leg below knee: Secondary | ICD-10-CM | POA: Diagnosis not present

## 2018-09-17 DIAGNOSIS — Z794 Long term (current) use of insulin: Secondary | ICD-10-CM | POA: Diagnosis not present

## 2018-09-17 DIAGNOSIS — I10 Essential (primary) hypertension: Secondary | ICD-10-CM

## 2018-09-17 DIAGNOSIS — G8929 Other chronic pain: Secondary | ICD-10-CM

## 2018-09-17 LAB — BAYER DCA HB A1C WAIVED: HB A1C: 10.1 % — AB (ref <7.0)

## 2018-09-17 MED ORDER — CYCLOBENZAPRINE HCL 10 MG PO TABS: 10.0000 mg | ORAL_TABLET | Freq: Three times a day (TID) | ORAL | 1 refills | Status: DC | PRN

## 2018-09-17 MED ORDER — HYDROCODONE-ACETAMINOPHEN 10-325 MG PO TABS: 1.0000 | ORAL_TABLET | Freq: Three times a day (TID) | ORAL | 0 refills | Status: DC | PRN

## 2018-09-17 NOTE — Telephone Encounter (Signed)
Pt aware PA approved [id number]

## 2018-09-17 NOTE — Progress Notes (Signed)
BP 117/80   Pulse 96   Temp 97.9 F (36.6 C) (Oral)   Ht 6' 4"  (1.93 m)   Wt 222 lb 6.4 oz (100.9 kg)   BMI 27.07 kg/m    Subjective:    Patient ID: [name], male    DOB: 16-Mar-1966, 52 y.o.   MRN: [id number]  HPI: [name] is a 52 y.o. male presenting on 09/17/2018 for Medication Refill (Norco) and Neck Pain (Right x 1 1/2 weeks)   HPI Type 2 diabetes mellitus Patient comes in today for recheck of his diabetes. Patient has been currently taking metformin and Basaglar 50 and NovoLog 30 3 times daily. Patient is currently on an ACE inhibitor/ARB. Patient has not seen an ophthalmologist this year. Patient has Charcot foot of the right and has a left BKA, he denies any sores or issues and says that it is going well.  He takes pain medication for the pain is related to his feet and his legs and the neuropathy.  He also takes gabapentin and Cymbalta.  Hypertension Patient is currently on lisinopril 5, and their blood pressure today is 117/80. Patient denies any lightheadedness or dizziness. Patient denies headaches, blurred vision, chest pains, shortness of breath, or weakness. Denies any side effects from medication and is content with current medication.   Rash Patient also complains of a rash on the right side of his neck that he is noticed come and go over the past few months at this time is been there over the past week and a half.  He says the rash is sometimes pruritic but has not had any drainage and sometimes is a little bit of the scaliness to it.  Relevant past medical, surgical, family and social history reviewed and updated as indicated. Interim medical history since our last visit reviewed. Allergies and medications reviewed and updated.  Review of Systems  Constitutional: Negative for chills and fever.  Respiratory: Negative for shortness of breath and wheezing.   Cardiovascular: Negative for chest pain and leg swelling.  Musculoskeletal: Negative for back  pain and gait problem.  Skin: Positive for rash.  Neurological: Negative for dizziness, weakness, light-headedness and numbness.  All other systems reviewed and are negative.   Per HPI unless specifically indicated above   Allergies as of 09/17/2018      Reactions   Penicillins Other (See Comments)   Has patient had a PCN reaction causing immediate rash, facial/tongue/throat swelling, SOB or lightheadedness with hypotension: No Has patient had a PCN reaction causing severe rash involving mucus membranes or skin necrosis: No Has patient had a PCN reaction that required hospitalization No Has patient had a PCN reaction occurring within the last 10 years: No If all of the above answers are "NO", then may proceed with Cephalosporin use.      Medication List        Accurate as of 09/17/18 11:44 AM. Always use your most recent med list.          amoxicillin 500 MG capsule Commonly known as:  AMOXIL TAKE 1 CAPSULE BY MOUTH EVERY EIGHT HOURS FOR 7 DAYS   aspirin EC 81 MG EC tablet Generic drug:  aspirin Take 81 mg by mouth daily. Swallow whole.   BASAGLAR KWIKPEN 100 UNIT/ML Sopn Inject 0.5 mLs (50 Units total) into the skin at bedtime.   BLOOD GLUCOSE TEST STRIPS Strp 1 strip by In Vitro route 2 (two) times daily.   cyclobenzaprine 10 MG tablet Commonly known  as:  FLEXERIL Take 1 tablet (10 mg total) by mouth 3 (three) times daily as needed for muscle spasms.   DULoxetine 30 MG capsule Commonly known as:  CYMBALTA Take 1 capsule (30 mg total) by mouth daily.   FREESTYLE LIBRE 14 DAY READER Devi 1 each by Does not apply route daily.   gabapentin 600 MG tablet Commonly known as:  NEURONTIN Take 1 tablet (600 mg total) by mouth at bedtime.   HYDROcodone-acetaminophen 10-325 MG tablet Commonly known as:  NORCO Take 1 tablet by mouth every 8 (eight) hours as needed for moderate pain.   ibuprofen 200 MG tablet Commonly known as:  ADVIL,MOTRIN Take 200 mg by mouth  every 6 (six) hours as needed.   lisinopril 5 MG tablet Commonly known as:  PRINIVIL,ZESTRIL TAKE 1 TABLET DAILY   metFORMIN 1000 MG tablet Commonly known as:  GLUCOPHAGE TAKE 1 TABLET TWICE A DAY   multivitamin with minerals tablet Take 1 tablet by mouth daily.   vitamin C 500 MG tablet Commonly known as:  ASCORBIC ACID Take 500 mg by mouth daily.   zinc gluconate 50 MG tablet Take 50 mg by mouth daily.          Objective:    BP 117/80   Pulse 96   Temp 97.9 F (36.6 C) (Oral)   Ht 6' 4"  (1.93 m)   Wt 222 lb 6.4 oz (100.9 kg)   BMI 27.07 kg/m   Wt Readings from Last 3 Encounters:  09/17/18 222 lb 6.4 oz (100.9 kg)  08/13/18 217 lb (98.4 kg)  07/18/18 214 lb 3.2 oz (97.2 kg)    Physical Exam  Constitutional: He is oriented to person, place, and time. He appears well-developed and well-nourished. No distress.  Eyes: Conjunctivae are normal. No scleral icterus.  Neck: Neck supple. No thyromegaly present.  Cardiovascular: Normal rate, regular rhythm, normal heart sounds and intact distal pulses.  No murmur heard. Pulmonary/Chest: Effort normal and breath sounds normal. No respiratory distress. He has no wheezes.  Musculoskeletal: Normal range of motion. He exhibits no edema.  Lymphadenopathy:    He has no cervical adenopathy.  Neurological: He is alert and oriented to person, place, and time. Coordination normal.  Skin: Skin is warm and dry. Rash (Circular patch of a scaly rash that is pink on right side of his neck about 3 cm in diameter) noted. He is not diaphoretic.  Psychiatric: He has a normal mood and affect. His behavior is normal.  Nursing note and vitals reviewed.       Assessment & Plan:   Problem List Items Addressed This Visit      Cardiovascular and Mediastinum   Essential hypertension, benign   Relevant Orders   CMP14+EGFR     Endocrine   Neuropathy in diabetes ([place])   Type 2 diabetes mellitus ([place]) - Primary   Relevant Orders   Bayer  DCA Hb A1c Waived   CMP14+EGFR     Other   S/P BKA (below knee amputation) unilateral, left (HCC)   Relevant Medications   HYDROcodone-acetaminophen (NORCO) 10-325 MG tablet    Other Visit Diagnoses    Chronic foot pain, unspecified laterality       Relevant Medications   HYDROcodone-acetaminophen (NORCO) 10-325 MG tablet   cyclobenzaprine (FLEXERIL) 10 MG tablet   Seborrheic dermatitis          Recommended antifungal cream for the rash on the right side of his neck  Refill Norco which he uses as  needed.  Recommended for him to increase his Basaglar up to 55 units from 23 and keep using his freestyle libre and let us know if it still running high after that. Follow up plan: Return in about 3 months (around 12/18/2018), or if symptoms worsen or fail to improve, for Recheck diabetes.  Counseling provided for all of the vaccine components Orders Placed This Encounter  Procedures  . Bayer DCA Hb A1c Waived  . Sharon, MD [facility] 09/17/2018, 11:44 AM

## 2018-09-18 LAB — CMP14+EGFR
ALT: 24 IU/L (ref 0–44)
AST: 16 IU/L (ref 0–40)
Albumin/Globulin Ratio: 2 (ref 1.2–2.2)
Albumin: 4.5 g/dL (ref 3.5–5.5)
Alkaline Phosphatase: 167 IU/L — ABNORMAL HIGH (ref 39–117)
BUN/Creatinine Ratio: 15 (ref 9–20)
BUN: 13 mg/dL (ref 6–24)
Bilirubin Total: 0.6 mg/dL (ref 0.0–1.2)
CALCIUM: 9.4 mg/dL (ref 8.7–10.2)
CO2: 25 mmol/L (ref 20–29)
CREATININE: 0.86 mg/dL (ref 0.76–1.27)
Chloride: 97 mmol/L (ref 96–106)
GFR, EST AFRICAN AMERICAN: 114 mL/min/{1.73_m2} (ref >59)
GFR, EST NON AFRICAN AMERICAN: 98 mL/min/{1.73_m2} (ref >59)
Globulin, Total: 2.3 g/dL (ref 1.5–4.5)
Glucose: 262 mg/dL — ABNORMAL HIGH (ref 65–99)
Potassium: 4.7 mmol/L (ref 3.5–5.2)
Sodium: 137 mmol/L (ref 134–144)
TOTAL PROTEIN: 6.8 g/dL (ref 6.0–8.5)

## 2018-10-02 ENCOUNTER — Other Ambulatory Visit: Payer: Self-pay | Admitting: Family Medicine

## 2018-10-02 DIAGNOSIS — Z794 Long term (current) use of insulin: Principal | ICD-10-CM

## 2018-10-02 DIAGNOSIS — M79673 Pain in unspecified foot: Secondary | ICD-10-CM

## 2018-10-02 DIAGNOSIS — G8929 Other chronic pain: Secondary | ICD-10-CM

## 2018-10-02 DIAGNOSIS — E1140 Type 2 diabetes mellitus with diabetic neuropathy, unspecified: Secondary | ICD-10-CM

## 2018-10-02 DIAGNOSIS — E1142 Type 2 diabetes mellitus with diabetic polyneuropathy: Secondary | ICD-10-CM

## 2018-10-11 ENCOUNTER — Ambulatory Visit: Payer: BLUE CROSS/BLUE SHIELD | Admitting: Family Medicine

## 2018-10-11 ENCOUNTER — Encounter: Payer: Self-pay | Admitting: Family Medicine

## 2018-10-11 VITALS — BP 141/80 | HR 92 | Temp 97.4°F | Ht 76.0 in | Wt 223.0 lb

## 2018-10-11 DIAGNOSIS — Z89512 Acquired absence of left leg below knee: Secondary | ICD-10-CM

## 2018-10-11 DIAGNOSIS — Z0289 Encounter for other administrative examinations: Secondary | ICD-10-CM | POA: Diagnosis not present

## 2018-10-11 DIAGNOSIS — M79673 Pain in unspecified foot: Secondary | ICD-10-CM

## 2018-10-11 DIAGNOSIS — G8929 Other chronic pain: Secondary | ICD-10-CM

## 2018-10-11 MED ORDER — HYDROCODONE-ACETAMINOPHEN 10-325 MG PO TABS
1.0000 | ORAL_TABLET | Freq: Two times a day (BID) | ORAL | 0 refills | Status: DC | PRN
Start: 2018-10-11 — End: 2019-01-08

## 2018-10-11 MED ORDER — HYDROCODONE-ACETAMINOPHEN 10-325 MG PO TABS: 1.0000 | ORAL_TABLET | Freq: Two times a day (BID) | ORAL | 0 refills | Status: DC | PRN

## 2018-10-11 NOTE — Progress Notes (Signed)
BP (!) 141/80   Pulse 92   Temp (!) 97.4 F (36.3 C) (Oral)   Ht 6\' 4"  (1.93 m)   Wt 223 lb (101.2 kg)   BMI 27.14 kg/m    Subjective:    Patient ID: [name], male    DOB: 1966/01/30, 52 y.o.   MRN: [id number]  HPI: Anthony RavensKeith B Haydel is a 52 y.o. male presenting on 10/11/2018 for Medication Refill (norco. Patinet needs an rx for socket for portatic leg)   HPI Patient is coming in for chronic pain management mostly in his legs where he is had a left BKA and a right leg has some arterial circulation problems and neuropathy and he has been currently taking hydrocodone 2 times a day for this to help with this.  He is also coming in today because he started to have some problems with the socket on his left prosthetic leg where it is not fitting well because it is stretched out the starting to get rubbing and irritation and sores from up again like previously.  He would like a prescription for a new socket.  Relevant past medical, surgical, family and social history reviewed and updated as indicated. Interim medical history since our last visit reviewed. Allergies and medications reviewed and updated.  Review of Systems  Constitutional: Negative for chills and fever.  Respiratory: Negative for shortness of breath and wheezing.   Cardiovascular: Negative for chest pain and leg swelling.  Musculoskeletal: Negative for back pain and gait problem.  Skin: Positive for color change (Patient has a slight color change but no sores on his left leg stump currently.). Negative for rash and wound.  All other systems reviewed and are negative.   Per HPI unless specifically indicated above   Allergies as of 10/11/2018      Reactions   Penicillins Other (See Comments)   Has patient had a PCN reaction causing immediate rash, facial/tongue/throat swelling, SOB or lightheadedness with hypotension: No Has patient had a PCN reaction causing severe rash involving mucus membranes or skin necrosis:  No Has patient had a PCN reaction that required hospitalization No Has patient had a PCN reaction occurring within the last 10 years: No If all of the above answers are "NO", then may proceed with Cephalosporin use.      Medication List        Accurate as of 10/11/18 11:59 PM. Always use your most recent med list.          amoxicillin 500 MG capsule Commonly known as:  AMOXIL TAKE 1 CAPSULE BY MOUTH EVERY EIGHT HOURS FOR 7 DAYS   aspirin EC 81 MG EC tablet Generic drug:  aspirin Take 81 mg by mouth daily. Swallow whole.   BASAGLAR KWIKPEN 100 UNIT/ML Sopn Inject 0.5 mLs (50 Units total) into the skin at bedtime.   BLOOD GLUCOSE TEST STRIPS Strp 1 strip by In Vitro route 2 (two) times daily.   cyclobenzaprine 10 MG tablet Commonly known as:  FLEXERIL Take 1 tablet (10 mg total) by mouth 3 (three) times daily as needed for muscle spasms.   DULoxetine 30 MG capsule Commonly known as:  CYMBALTA TAKE 1 CAPSULE BY MOUTH EVERY DAY   FREESTYLE LIBRE 14 DAY READER Devi 1 each by Does not apply route daily.   gabapentin 600 MG tablet Commonly known as:  NEURONTIN Take 1 tablet (600 mg total) by mouth at bedtime.   HYDROcodone-acetaminophen 10-325 MG tablet Commonly known as:  NORCO Take 1  tablet by mouth 2 (two) times daily as needed. Do not refill until 30 days from prescription date   HYDROcodone-acetaminophen 10-325 MG tablet Commonly known as:  NORCO Take 1 tablet by mouth 2 (two) times daily as needed. Do not refill until 60 days from prescription date   HYDROcodone-acetaminophen 10-325 MG tablet Commonly known as:  NORCO Take 1 tablet by mouth 2 (two) times daily as needed for moderate pain.   ibuprofen 200 MG tablet Commonly known as:  ADVIL,MOTRIN Take 200 mg by mouth every 6 (six) hours as needed.   lisinopril 5 MG tablet Commonly known as:  PRINIVIL,ZESTRIL TAKE 1 TABLET DAILY   metFORMIN 1000 MG tablet Commonly known as:  GLUCOPHAGE TAKE 1 TABLET TWICE  A DAY   multivitamin with minerals tablet Take 1 tablet by mouth daily.   vitamin C 500 MG tablet Commonly known as:  ASCORBIC ACID Take 500 mg by mouth daily.   zinc gluconate 50 MG tablet Take 50 mg by mouth daily.            Durable Medical Equipment  (From admission, onward)         Start     Ordered   10/11/18 0000  DME Other see comment    Comments:  Left leg socket replacement for prosthetic, diagnosis status post left BKA.  Fax [phone]   10/11/18 1117             Objective:    BP (!) 141/80   Pulse 92   Temp (!) 97.4 F (36.3 C) (Oral)   Ht 6\' 4"  (1.93 m)   Wt 223 lb (101.2 kg)   BMI 27.14 kg/m   Wt Readings from Last 3 Encounters:  10/11/18 223 lb (101.2 kg)  09/17/18 222 lb 6.4 oz (100.9 kg)  08/13/18 217 lb (98.4 kg)    Physical Exam  Constitutional: He is oriented to person, place, and time. He appears well-developed and well-nourished. No distress.  Eyes: Conjunctivae are normal. No scleral icterus.  Cardiovascular: Normal rate, regular rhythm, normal heart sounds and intact distal pulses.  No murmur heard. Pulmonary/Chest: Effort normal and breath sounds normal. No respiratory distress. He has no wheezes.  Neurological: He is alert and oriented to person, place, and time. Coordination normal.  Skin: Skin is warm and dry. No abrasion, no laceration and no rash noted. He is not diaphoretic. No erythema.  Patient has superficial skin discoloration from his socket rubbing but has no blisters or sores or wounds today.  Psychiatric: He has a normal mood and affect. His behavior is normal.  Nursing note and vitals reviewed.       Assessment & Plan:   Problem List Items Addressed This Visit      Other   Pain management contract signed - Primary   Relevant Medications   HYDROcodone-acetaminophen (NORCO) 10-325 MG tablet   HYDROcodone-acetaminophen (NORCO) 10-325 MG tablet   HYDROcodone-acetaminophen (NORCO) 10-325 MG tablet   S/P BKA  (below knee amputation) unilateral, left (HCC)   Relevant Medications   HYDROcodone-acetaminophen (NORCO) 10-325 MG tablet   HYDROcodone-acetaminophen (NORCO) 10-325 MG tablet   HYDROcodone-acetaminophen (NORCO) 10-325 MG tablet   Other Relevant Orders   DME Other see comment    Other Visit Diagnoses    Chronic foot pain, unspecified laterality       Relevant Medications   HYDROcodone-acetaminophen (NORCO) 10-325 MG tablet   HYDROcodone-acetaminophen (NORCO) 10-325 MG tablet   HYDROcodone-acetaminophen (NORCO) 10-325 MG tablet  Patient has a left BKA and he says that his socket needs to be replaced because he starting to get some irritation and sores from where it has stretched out with use.  He does use his current one very frequently and almost all the time and he does use it ambulate get around quite well and he would be very much less functional in his life if he did not have it. Follow up plan: Return in about 3 months (around 01/10/2019), or if symptoms worsen or fail to improve, for Diabetes and pain management.  Counseling provided for all of the vaccine components Orders Placed This Encounter  Procedures  . DME Other see comment    Arville Care, MD [facility] 10/16/2018, 9:33 AM

## 2018-10-16 ENCOUNTER — Other Ambulatory Visit: Payer: Self-pay

## 2018-10-16 ENCOUNTER — Telehealth: Payer: Self-pay | Admitting: Family Medicine

## 2018-10-16 ENCOUNTER — Other Ambulatory Visit: Payer: Self-pay | Admitting: *Deleted

## 2018-10-16 DIAGNOSIS — Z89511 Acquired absence of right leg below knee: Secondary | ICD-10-CM

## 2018-10-16 DIAGNOSIS — Z89512 Acquired absence of left leg below knee: Secondary | ICD-10-CM

## 2018-10-16 NOTE — Telephone Encounter (Signed)
It looks like it was sent in and then we got some paperwork back from them that we did today and that has been sent in as well.

## 2018-10-16 NOTE — Telephone Encounter (Signed)
Pt aware of MD feedback and voiced understanding. 

## 2018-10-17 ENCOUNTER — Ambulatory Visit: Payer: BLUE CROSS/BLUE SHIELD | Admitting: Family Medicine

## 2018-10-17 DIAGNOSIS — M9904 Segmental and somatic dysfunction of sacral region: Secondary | ICD-10-CM | POA: Diagnosis not present

## 2018-10-17 DIAGNOSIS — M62830 Muscle spasm of back: Secondary | ICD-10-CM | POA: Diagnosis not present

## 2018-10-17 DIAGNOSIS — M9905 Segmental and somatic dysfunction of pelvic region: Secondary | ICD-10-CM | POA: Diagnosis not present

## 2018-10-17 DIAGNOSIS — M9903 Segmental and somatic dysfunction of lumbar region: Secondary | ICD-10-CM | POA: Diagnosis not present

## 2018-10-21 DIAGNOSIS — M62830 Muscle spasm of back: Secondary | ICD-10-CM | POA: Diagnosis not present

## 2018-10-21 DIAGNOSIS — M9904 Segmental and somatic dysfunction of sacral region: Secondary | ICD-10-CM | POA: Diagnosis not present

## 2018-10-21 DIAGNOSIS — M9905 Segmental and somatic dysfunction of pelvic region: Secondary | ICD-10-CM | POA: Diagnosis not present

## 2018-10-21 DIAGNOSIS — M9903 Segmental and somatic dysfunction of lumbar region: Secondary | ICD-10-CM | POA: Diagnosis not present

## 2018-10-22 DIAGNOSIS — M62830 Muscle spasm of back: Secondary | ICD-10-CM | POA: Diagnosis not present

## 2018-10-22 DIAGNOSIS — M9904 Segmental and somatic dysfunction of sacral region: Secondary | ICD-10-CM | POA: Diagnosis not present

## 2018-10-22 DIAGNOSIS — M9903 Segmental and somatic dysfunction of lumbar region: Secondary | ICD-10-CM | POA: Diagnosis not present

## 2018-10-22 DIAGNOSIS — M9905 Segmental and somatic dysfunction of pelvic region: Secondary | ICD-10-CM | POA: Diagnosis not present

## 2018-10-23 DIAGNOSIS — M62830 Muscle spasm of back: Secondary | ICD-10-CM | POA: Diagnosis not present

## 2018-10-23 DIAGNOSIS — M9904 Segmental and somatic dysfunction of sacral region: Secondary | ICD-10-CM | POA: Diagnosis not present

## 2018-10-23 DIAGNOSIS — M9905 Segmental and somatic dysfunction of pelvic region: Secondary | ICD-10-CM | POA: Diagnosis not present

## 2018-10-23 DIAGNOSIS — M9903 Segmental and somatic dysfunction of lumbar region: Secondary | ICD-10-CM | POA: Diagnosis not present

## 2018-10-24 DIAGNOSIS — M62830 Muscle spasm of back: Secondary | ICD-10-CM | POA: Diagnosis not present

## 2018-10-24 DIAGNOSIS — M9905 Segmental and somatic dysfunction of pelvic region: Secondary | ICD-10-CM | POA: Diagnosis not present

## 2018-10-24 DIAGNOSIS — M9903 Segmental and somatic dysfunction of lumbar region: Secondary | ICD-10-CM | POA: Diagnosis not present

## 2018-10-24 DIAGNOSIS — M9904 Segmental and somatic dysfunction of sacral region: Secondary | ICD-10-CM | POA: Diagnosis not present

## 2018-10-31 DIAGNOSIS — M9904 Segmental and somatic dysfunction of sacral region: Secondary | ICD-10-CM | POA: Diagnosis not present

## 2018-10-31 DIAGNOSIS — M62830 Muscle spasm of back: Secondary | ICD-10-CM | POA: Diagnosis not present

## 2018-10-31 DIAGNOSIS — M9905 Segmental and somatic dysfunction of pelvic region: Secondary | ICD-10-CM | POA: Diagnosis not present

## 2018-10-31 DIAGNOSIS — M9903 Segmental and somatic dysfunction of lumbar region: Secondary | ICD-10-CM | POA: Diagnosis not present

## 2018-10-31 DIAGNOSIS — Z89512 Acquired absence of left leg below knee: Secondary | ICD-10-CM | POA: Diagnosis not present

## 2018-11-04 DIAGNOSIS — M9904 Segmental and somatic dysfunction of sacral region: Secondary | ICD-10-CM | POA: Diagnosis not present

## 2018-11-04 DIAGNOSIS — M62830 Muscle spasm of back: Secondary | ICD-10-CM | POA: Diagnosis not present

## 2018-11-04 DIAGNOSIS — M9903 Segmental and somatic dysfunction of lumbar region: Secondary | ICD-10-CM | POA: Diagnosis not present

## 2018-11-04 DIAGNOSIS — M9905 Segmental and somatic dysfunction of pelvic region: Secondary | ICD-10-CM | POA: Diagnosis not present

## 2018-11-07 DIAGNOSIS — M9904 Segmental and somatic dysfunction of sacral region: Secondary | ICD-10-CM | POA: Diagnosis not present

## 2018-11-07 DIAGNOSIS — M62830 Muscle spasm of back: Secondary | ICD-10-CM | POA: Diagnosis not present

## 2018-11-07 DIAGNOSIS — M9905 Segmental and somatic dysfunction of pelvic region: Secondary | ICD-10-CM | POA: Diagnosis not present

## 2018-11-07 DIAGNOSIS — M9903 Segmental and somatic dysfunction of lumbar region: Secondary | ICD-10-CM | POA: Diagnosis not present

## 2018-11-08 ENCOUNTER — Telehealth: Payer: Self-pay | Admitting: Family Medicine

## 2018-11-08 NOTE — Telephone Encounter (Signed)
Pt's [employer] has changed to the [facility] stated to me that it is not covered, it did not tell them it needed a PA. Pt is contacting his insurance as well for covered medications

## 2018-11-11 DIAGNOSIS — M9904 Segmental and somatic dysfunction of sacral region: Secondary | ICD-10-CM | POA: Diagnosis not present

## 2018-11-11 DIAGNOSIS — M62830 Muscle spasm of back: Secondary | ICD-10-CM | POA: Diagnosis not present

## 2018-11-11 DIAGNOSIS — M9903 Segmental and somatic dysfunction of lumbar region: Secondary | ICD-10-CM | POA: Diagnosis not present

## 2018-11-11 DIAGNOSIS — M9905 Segmental and somatic dysfunction of pelvic region: Secondary | ICD-10-CM | POA: Diagnosis not present

## 2018-11-13 ENCOUNTER — Telehealth: Payer: Self-pay

## 2018-11-13 DIAGNOSIS — E1142 Type 2 diabetes mellitus with diabetic polyneuropathy: Secondary | ICD-10-CM

## 2018-11-13 DIAGNOSIS — Z794 Long term (current) use of insulin: Principal | ICD-10-CM

## 2018-11-13 NOTE — Telephone Encounter (Signed)
Insurance denied Therapist, nutritionalBasaglar Kwikpen  Patient has only tried one of the alternative which is Lantus  Needs to try one of the following Toujeo Levemir or tresiba

## 2018-11-14 MED ORDER — INSULIN GLARGINE (2 UNIT DIAL) 300 UNIT/ML ~~LOC~~ SOPN: 40.0000 [IU] | PEN_INJECTOR | Freq: Every day | SUBCUTANEOUS | 5 refills | Status: DC

## 2018-11-14 NOTE — Addendum Note (Signed)
Addended by: Arville Care on: 11/14/2018 04:20 PM   Modules accepted: Orders

## 2018-11-14 NOTE — Telephone Encounter (Signed)
He needs to try levemir, toujeo, or tresiba

## 2018-11-14 NOTE — Telephone Encounter (Signed)
Lantus is the same thing is Basaglar so if they will cover Lantus just send him Lantus at the same dose and prescription that he had the Basaglar.

## 2018-11-14 NOTE — Telephone Encounter (Signed)
Please let patient know that I sent in Toujeo.  He may have to reduce his dose down to 40 units a day but he will have to see, he may still need 50 units of this.  Call me in 1 week with blood sugars and especially call me sooner if they are having hypoglycemic episodes.

## 2018-11-18 ENCOUNTER — Encounter: Payer: Self-pay | Admitting: Family Medicine

## 2018-11-18 ENCOUNTER — Ambulatory Visit (INDEPENDENT_AMBULATORY_CARE_PROVIDER_SITE_OTHER): Payer: BLUE CROSS/BLUE SHIELD | Admitting: Family Medicine

## 2018-11-18 VITALS — BP 143/77 | HR 100 | Temp 98.7°F | Ht 76.0 in | Wt 221.0 lb

## 2018-11-18 DIAGNOSIS — J40 Bronchitis, not specified as acute or chronic: Secondary | ICD-10-CM

## 2018-11-18 DIAGNOSIS — E1142 Type 2 diabetes mellitus with diabetic polyneuropathy: Secondary | ICD-10-CM | POA: Diagnosis not present

## 2018-11-18 DIAGNOSIS — E1140 Type 2 diabetes mellitus with diabetic neuropathy, unspecified: Secondary | ICD-10-CM | POA: Diagnosis not present

## 2018-11-18 DIAGNOSIS — Z794 Long term (current) use of insulin: Secondary | ICD-10-CM

## 2018-11-18 LAB — VERITOR FLU A/B WAIVED
INFLUENZA A: NEGATIVE
INFLUENZA B: NEGATIVE

## 2018-11-18 MED ORDER — LISINOPRIL 5 MG PO TABS
5.0000 mg | ORAL_TABLET | Freq: Every day | ORAL | 3 refills | Status: DC
Start: 2018-11-18 — End: 2019-12-09

## 2018-11-18 MED ORDER — METFORMIN HCL 1000 MG PO TABS: 1000.0000 mg | ORAL_TABLET | Freq: Two times a day (BID) | ORAL | 3 refills | Status: DC

## 2018-11-18 MED ORDER — AMOXICILLIN-POT CLAVULANATE 875-125 MG PO TABS
1.0000 | ORAL_TABLET | Freq: Two times a day (BID) | ORAL | 0 refills | Status: DC
Start: 2018-11-18 — End: 2018-12-18

## 2018-11-18 MED ORDER — INSULIN PEN NEEDLE 31G X 5 MM MISC: 1.0000 | Freq: Four times a day (QID) | 11 refills | Status: DC

## 2018-11-18 NOTE — Patient Instructions (Signed)
Start with Toujeo 40 units daily and if there are no hypoglycemic episodes then increase to 50.

## 2018-11-18 NOTE — Progress Notes (Signed)
BP (!) 143/77   Pulse 100   Temp 98.7 F (37.1 C) (Oral)   Ht 6\' 4"  (1.93 m)   Wt 221 lb (100.2 kg)   SpO2 98%   BMI 26.90 kg/m    Subjective:    Patient ID: [name], male    DOB: September 29, 1966, 53 y.o.   MRN: [id number]  HPI: [name] is a 53 y.o. male presenting on 11/18/2018 for Diabetes; Chills (x 2 days); Cough; Headache; and Fatigue   HPI Type 2 diabetes mellitus Patient comes in today for recheck of his diabetes. Patient has been currently taking Basaglar 50 units and metformin and gabapentin and lisinopril. Patient is currently on an ACE inhibitor/ARB. Patient has not seen an ophthalmologist this year. Patient denies any new issues with their feet.  Patient does have peripheral neuropathy and an amputation on his left below the knee.  He is coming in mainly to discuss Toujeo because at the beginning the year his insulin that he had been on previously was not covered and [facility] and he was wondering why this was happening.  Cough and chest congestion and wheezing Patient is coming in complaining of cough and congestion and wheezing and trouble breathing and fevers and body aches and says is been going on for 2 years and is just worsening.  He has been using Robitussin-DM and Flonase and ibuprofen and does not feel like they have been helping significantly and he just feels still achy and just not feeling well and he feels like he is having significant chest congestion and cough.  He denies any shortness of breath today.  He has not taken his temperature at home but did feel chills last night and his temperature here today is 98.  Relevant past medical, surgical, family and social history reviewed and updated as indicated. Interim medical history since our last visit reviewed. Allergies and medications reviewed and updated.  Review of Systems  Constitutional: Positive for chills. Negative for fever.  HENT: Positive for congestion and  postnasal drip. Negative for ear discharge, ear pain, rhinorrhea, sinus pressure, sneezing, sore throat and voice change.   Eyes: Negative for pain, discharge, redness and visual disturbance.  Respiratory: Positive for cough and wheezing. Negative for shortness of breath.   Cardiovascular: Negative for chest pain and leg swelling.  Musculoskeletal: Positive for myalgias. Negative for back pain and gait problem.  Skin: Negative for rash.  All other systems reviewed and are negative.   Per HPI unless specifically indicated above   Allergies as of 11/18/2018      Reactions   Penicillins Other (See Comments)   Has patient had a PCN reaction causing immediate rash, facial/tongue/throat swelling, SOB or lightheadedness with hypotension: No Has patient had a PCN reaction causing severe rash involving mucus membranes or skin necrosis: No Has patient had a PCN reaction that required hospitalization No Has patient had a PCN reaction occurring within the last 10 years: No If all of the above answers are "NO", then may proceed with Cephalosporin use.      Medication List       Accurate as of November 18, 2018 11:12 AM. Always use your most recent med list.        amoxicillin-clavulanate 875-125 MG tablet Commonly known as:  AUGMENTIN Take 1 tablet by mouth 2 (two) times daily.   aspirin EC 81 MG EC tablet Generic drug:  aspirin Take 81 mg by mouth daily.  Swallow whole.   BLOOD GLUCOSE TEST STRIPS Strp 1 strip by In Vitro route 2 (two) times daily.   FREESTYLE LIBRE 14 DAY READER Devi 1 each by Does not apply route daily.   gabapentin 600 MG tablet Commonly known as:  NEURONTIN Take 1 tablet (600 mg total) by mouth at bedtime.   HYDROcodone-acetaminophen 10-325 MG tablet Commonly known as:  NORCO Take 1 tablet by mouth 2 (two) times daily as needed. Do not refill until 30 days from prescription date   HYDROcodone-acetaminophen 10-325 MG tablet Commonly known as:  NORCO Take 1  tablet by mouth 2 (two) times daily as needed. Do not refill until 60 days from prescription date   HYDROcodone-acetaminophen 10-325 MG tablet Commonly known as:  NORCO Take 1 tablet by mouth 2 (two) times daily as needed for moderate pain.   ibuprofen 200 MG tablet Commonly known as:  ADVIL,MOTRIN Take 200 mg by mouth every 6 (six) hours as needed.   Insulin Glargine (2 Unit Dial) 300 UNIT/ML Sopn Commonly known as:  TOUJEO MAX SOLOSTAR Inject 40-50 Units into the skin daily.   Insulin Pen Needle 31G X 5 MM Misc Commonly known as:  B-D UF III MINI PEN NEEDLES 1 each by Does not apply route 4 (four) times daily.   lisinopril 5 MG tablet Commonly known as:  PRINIVIL,ZESTRIL Take 1 tablet (5 mg total) by mouth daily.   metFORMIN 1000 MG tablet Commonly known as:  GLUCOPHAGE Take 1 tablet (1,000 mg total) by mouth 2 (two) times daily.   multivitamin with minerals tablet Take 1 tablet by mouth daily.   vitamin C 500 MG tablet Commonly known as:  ASCORBIC ACID Take 500 mg by mouth daily.   zinc gluconate 50 MG tablet Take 50 mg by mouth daily.          Objective:    BP (!) 143/77   Pulse 100   Temp 98.7 F (37.1 C) (Oral)   Ht 6\' 4"  (1.93 m)   Wt 221 lb (100.2 kg)   SpO2 98%   BMI 26.90 kg/m   Wt Readings from Last 3 Encounters:  11/18/18 221 lb (100.2 kg)  10/11/18 223 lb (101.2 kg)  09/17/18 222 lb 6.4 oz (100.9 kg)    Physical Exam Vitals signs and nursing note reviewed.  Constitutional:      General: He is not in acute distress.    Appearance: He is well-developed. He is not diaphoretic.  Eyes:     General: No scleral icterus.       Right eye: No discharge.     Conjunctiva/sclera: Conjunctivae normal.     Pupils: Pupils are equal, round, and reactive to light.  Neck:     Musculoskeletal: Neck supple.     Thyroid: No thyromegaly.  Cardiovascular:     Rate and Rhythm: Normal rate and regular rhythm.     Heart sounds: Normal heart sounds. No murmur.    Pulmonary:     Effort: Pulmonary effort is normal. No respiratory distress.     Breath sounds: Wheezing and rhonchi present.  Musculoskeletal: Normal range of motion.  Lymphadenopathy:     Cervical: No cervical adenopathy.  Skin:    General: Skin is warm and dry.     Findings: No rash.  Neurological:     Mental Status: He is alert and oriented to person, place, and time.     Coordination: Coordination normal.  Psychiatric:        Behavior: Behavior  normal.     Rapid flu negative    Assessment & Plan:   Problem List Items Addressed This Visit      Endocrine   Neuropathy in diabetes (HCC)   Relevant Medications   metFORMIN (GLUCOPHAGE) 1000 MG tablet   lisinopril (PRINIVIL,ZESTRIL) 5 MG tablet   Type 2 diabetes mellitus (HCC)   Relevant Medications   metFORMIN (GLUCOPHAGE) 1000 MG tablet   lisinopril (PRINIVIL,ZESTRIL) 5 MG tablet    Other Visit Diagnoses    Bronchitis    -  Primary   Relevant Medications   amoxicillin-clavulanate (AUGMENTIN) 875-125 MG tablet   Other Relevant Orders   Veritor Flu A/B Waived      Discussed why patient had to be [facility] to to jail and he was understandable and will continue his other medication as such.  Recommended starting with 40 of Toujeo and then increasing to 50.  Patient has previously tolerated amoxicillin and will give Augmentin to help with current respiratory illness, it sounds like he is on the verge of a pneumonia. Follow up plan: Return in about 2 months (around 01/17/2019), or if symptoms worsen or fail to improve, for Type 2 diabetes.  Counseling provided for all of the vaccine components Orders Placed This Encounter  Procedures  . Veritor Flu A/B Reynolds Bowl, MD [facility] 11/18/2018, 11:12 AM

## 2018-11-19 NOTE — Telephone Encounter (Signed)
Patient was seen 11/18/2018

## 2018-11-21 MED ORDER — FREESTYLE LIBRE 14 DAY SENSOR MISC: 1.0000 | 11 refills | Status: DC

## 2018-11-21 NOTE — Telephone Encounter (Signed)
Pt needs new senor for [employer] device [facility]

## 2018-11-21 NOTE — Telephone Encounter (Signed)
Hey, he needs a new sensor? Ignore the rest of the message

## 2018-11-21 NOTE — Telephone Encounter (Signed)
Libre sensors [facility].

## 2018-11-22 ENCOUNTER — Telehealth: Payer: Self-pay | Admitting: Family Medicine

## 2018-11-22 NOTE — Telephone Encounter (Signed)
Sensors for [employer] for 6 of them at the pharmacy and pt can't afford that wants to know what dr dettinger could maybe do to help him?

## 2018-11-24 NOTE — Telephone Encounter (Signed)
Unfortunately I do not know of any coupon cards or anything available to lower the price of this, we can write the prescription for only 2 at a time so that would give him a month supply at a time if that would be more affordable at a time.  I believe he is also on Medicare and if he is then it may be worth him looking into the county health department prescription drug assistance plans

## 2018-11-25 NOTE — Telephone Encounter (Signed)
Patient aware and states he will do the monthly. States he will let us know if it is not affordable.

## 2018-12-02 DIAGNOSIS — M9904 Segmental and somatic dysfunction of sacral region: Secondary | ICD-10-CM | POA: Diagnosis not present

## 2018-12-02 DIAGNOSIS — M9905 Segmental and somatic dysfunction of pelvic region: Secondary | ICD-10-CM | POA: Diagnosis not present

## 2018-12-02 DIAGNOSIS — M62830 Muscle spasm of back: Secondary | ICD-10-CM | POA: Diagnosis not present

## 2018-12-02 DIAGNOSIS — M9903 Segmental and somatic dysfunction of lumbar region: Secondary | ICD-10-CM | POA: Diagnosis not present

## 2018-12-10 DIAGNOSIS — M9903 Segmental and somatic dysfunction of lumbar region: Secondary | ICD-10-CM | POA: Diagnosis not present

## 2018-12-10 DIAGNOSIS — M62830 Muscle spasm of back: Secondary | ICD-10-CM | POA: Diagnosis not present

## 2018-12-10 DIAGNOSIS — M9905 Segmental and somatic dysfunction of pelvic region: Secondary | ICD-10-CM | POA: Diagnosis not present

## 2018-12-10 DIAGNOSIS — M9904 Segmental and somatic dysfunction of sacral region: Secondary | ICD-10-CM | POA: Diagnosis not present

## 2018-12-16 DIAGNOSIS — M9905 Segmental and somatic dysfunction of pelvic region: Secondary | ICD-10-CM | POA: Diagnosis not present

## 2018-12-16 DIAGNOSIS — M9903 Segmental and somatic dysfunction of lumbar region: Secondary | ICD-10-CM | POA: Diagnosis not present

## 2018-12-16 DIAGNOSIS — M62830 Muscle spasm of back: Secondary | ICD-10-CM | POA: Diagnosis not present

## 2018-12-16 DIAGNOSIS — M9904 Segmental and somatic dysfunction of sacral region: Secondary | ICD-10-CM | POA: Diagnosis not present

## 2018-12-18 ENCOUNTER — Encounter: Payer: Self-pay | Admitting: Family Medicine

## 2018-12-18 ENCOUNTER — Ambulatory Visit (INDEPENDENT_AMBULATORY_CARE_PROVIDER_SITE_OTHER): Payer: BLUE CROSS/BLUE SHIELD | Admitting: Family Medicine

## 2018-12-18 VITALS — BP 137/77 | HR 94 | Temp 98.2°F | Ht 76.0 in | Wt 219.2 lb

## 2018-12-18 DIAGNOSIS — Z89512 Acquired absence of left leg below knee: Secondary | ICD-10-CM

## 2018-12-18 DIAGNOSIS — Z794 Long term (current) use of insulin: Secondary | ICD-10-CM | POA: Diagnosis not present

## 2018-12-18 DIAGNOSIS — E1142 Type 2 diabetes mellitus with diabetic polyneuropathy: Secondary | ICD-10-CM

## 2018-12-18 DIAGNOSIS — M14671 Charcot's joint, right ankle and foot: Secondary | ICD-10-CM | POA: Diagnosis not present

## 2018-12-18 DIAGNOSIS — I10 Essential (primary) hypertension: Secondary | ICD-10-CM

## 2018-12-18 LAB — BAYER DCA HB A1C WAIVED: HB A1C (BAYER DCA - WAIVED): 11.3 % — ABNORMAL HIGH (ref <7.0)

## 2018-12-18 MED ORDER — CYCLOBENZAPRINE HCL 10 MG PO TABS: 10.0000 mg | ORAL_TABLET | Freq: Every evening | ORAL | 3 refills | Status: DC | PRN

## 2018-12-18 MED ORDER — DULOXETINE HCL 60 MG PO CPEP: 60.0000 mg | ORAL_CAPSULE | Freq: Every day | ORAL | 3 refills | Status: DC

## 2018-12-18 NOTE — Progress Notes (Signed)
BP 137/77   Pulse 94   Temp 98.2 F (36.8 C) (Oral)   Ht _0  (1.93 m)   Wt 219 lb 3.2 oz (99.4 kg)   BMI 26.68 kg/m    Subjective:    Patient ID: [name], male    DOB: 10-23-1966, 53 y.o.   MRN: [id number]  HPI: [name] is a 53 y.o. male presenting on 12/18/2018 for Diabetes (3 month follow up- Patient states that he has been having a lot of cramps in his left leg.) and Hypertension   HPI Type 2 diabetes mellitus Patient comes in today for recheck of his diabetes. Patient has been currently taking Toujeo 40 units and metformin. Patient is currently on an ACE inhibitor/ARB. Patient has not seen an ophthalmologist this year.  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  Patient is unlikely to be able to return to work.  Hypertension Patient is currently on lisinopril, and their blood pressure today is 137/77. Patient denies any lightheadedness or dizziness. Patient denies headaches, blurred vision, chest pains, shortness of breath, or weakness. Denies any side effects from medication and is content with current medication.  Patient has chronic pain due to Charcot's foot and a BKA on his left lower extremity and partial amputations on his right foot and toes.  He currently takes hydrocodone 10-3 25 twice daily as needed with a morphine equivalent dose of 20 mg daily and tolerates it well and has been doing well on this.  Relevant past medical, surgical, family and social history reviewed and updated as indicated. Interim medical history since our last visit reviewed. Allergies and medications reviewed and updated.  Review of Systems  Constitutional: Negative for chills and fever.  Eyes: Negative for visual disturbance.  Respiratory: Negative for shortness of breath and  wheezing.   Cardiovascular: Negative for chest pain and leg swelling.  Musculoskeletal: Positive for arthralgias and myalgias. Negative for back pain and gait problem.  Skin: Negative for rash.  Neurological: Positive for numbness. Negative for dizziness, weakness and light-headedness.  All other systems reviewed and are negative.   Per HPI unless specifically indicated above   Allergies as of 12/18/2018      Reactions   Penicillins Other (See Comments)   Has patient had a PCN reaction causing immediate rash, facial/tongue/throat swelling, SOB or lightheadedness with hypotension: No Has patient had a PCN reaction causing severe rash involving mucus membranes or skin necrosis: No Has patient had a PCN reaction that required hospitalization No Has patient had a PCN reaction occurring within the last 10 years: No If all of the above answers are "NO", then may proceed with Cephalosporin use.      Medication List       Accurate as of December 18, 2018 11:59 PM. Always use your most recent med list.        aspirin EC 81 MG EC tablet Generic drug:  aspirin Take 81 mg by mouth daily. Swallow whole.   BLOOD GLUCOSE TEST STRIPS Strp 1 strip by In Vitro route 2 (two) times daily.   cyclobenzaprine 10 MG tablet Commonly known as:  FLEXERIL Take 1 tablet (10 mg total) by mouth at bedtime as needed for muscle spasms.   DULoxetine 60 MG capsule Commonly known as:  CYMBALTA Take 1 capsule (60 mg total) by mouth daily.   FREESTYLE LIBRE 14 DAY READER Devi 1 each by Does not apply route daily.   FREESTYLE LIBRE 14 DAY SENSOR Misc 1 each by Does not apply route every 14 (fourteen) days.   gabapentin 600 MG tablet Commonly known as:  NEURONTIN Take 1 tablet (600 mg total) by mouth at bedtime.   HYDROcodone-acetaminophen 10-325 MG tablet Commonly known as:  NORCO Take 1 tablet by mouth 2 (two) times daily as needed. Do not refill until 30 days from prescription date     HYDROcodone-acetaminophen 10-325 MG tablet Commonly known as:  NORCO Take 1 tablet by mouth 2 (two) times daily as needed. Do not refill until 60 days from prescription date   HYDROcodone-acetaminophen 10-325 MG tablet Commonly known as:  NORCO Take 1 tablet by mouth 2 (two) times daily as needed for moderate pain.   ibuprofen 200 MG tablet Commonly known as:  ADVIL,MOTRIN Take 200 mg by mouth every 6 (six) hours as needed.   Insulin Glargine (2 Unit Dial) 300 UNIT/ML Sopn Commonly known as:  TOUJEO MAX SOLOSTAR Inject 40-50 Units into the skin daily.   Insulin Pen Needle 31G X 5 MM Misc Commonly known as:  B-D UF III MINI PEN NEEDLES 1 each by Does not apply route 4 (four) times daily.   lisinopril 5 MG tablet Commonly known as:  PRINIVIL,ZESTRIL Take 1 tablet (5 mg total) by mouth daily.   metFORMIN 1000 MG tablet Commonly known as:  GLUCOPHAGE Take 1 tablet (1,000 mg total) by mouth 2 (two) times daily.   multivitamin with minerals tablet Take 1 tablet by mouth daily.   vitamin C 500 MG tablet Commonly known as:  ASCORBIC ACID Take 500 mg by mouth daily.   zinc gluconate 50 MG tablet Take 50 mg by mouth daily.          Objective:    BP 137/77   Pulse 94   Temp 98.2 F (36.8 C) (Oral)   Ht _0  (1.93 m)   Wt 219 lb 3.2 oz (99.4 kg)   BMI 26.68 kg/m   Wt Readings from Last 3 Encounters:  12/18/18 219 lb 3.2 oz (99.4 kg)  11/18/18 221 lb (100.2 kg)  10/11/18 223 lb (101.2 kg)    Physical Exam Vitals signs and nursing note reviewed.  Constitutional:      General: He is not in acute distress.    Appearance: He is well-developed. He is not diaphoretic.  Eyes:     General: No scleral icterus.       Right eye: No discharge.     Conjunctiva/sclera: Conjunctivae normal.     Pupils: Pupils are equal, round, and reactive to light.  Neck:     Musculoskeletal: Neck supple.     Thyroid: No thyromegaly.  Cardiovascular:     Rate and Rhythm: Normal rate and  regular rhythm.     Heart sounds: Normal heart sounds. No murmur.  Pulmonary:     Effort: Pulmonary effort is normal. No respiratory distress.     Breath sounds: Normal breath sounds. No wheezing.  Musculoskeletal:     Comments: Left BKA with prosthesis, patient has a brace on his right lower extremity because of Charcot's foot to hold it in place.  Lymphadenopathy:     Cervical: No cervical adenopathy.  Skin:    General: Skin is warm and dry.     Findings: No rash.  Neurological:  Mental Status: He is alert and oriented to person, place, and time.     Coordination: Coordination normal.  Psychiatric:        Behavior: Behavior normal.         Assessment & Plan:   Problem List Items Addressed This Visit      Cardiovascular and Mediastinum   Essential hypertension, benign     Endocrine   Neuropathy in diabetes ([place])   Relevant Medications   DULoxetine (CYMBALTA) 60 MG capsule   cyclobenzaprine (FLEXERIL) 10 MG tablet   Type 2 diabetes mellitus (HCC) - Primary   Relevant Orders   Bayer DCA Hb A1c Waived (Completed)   CMP14+EGFR (Completed)     Musculoskeletal and Integument   Charcot ankle   Relevant Medications   cyclobenzaprine (FLEXERIL) 10 MG tablet     Other   S/P BKA (below knee amputation) unilateral, left (HCC)      Continue metformin and Toujeo.  Continue Cymbalta and gabapentin, will increase the Cymbalta to 60 Follow up plan: Return in about 3 weeks (around 01/08/2019), or if symptoms worsen or fail to improve, for Pain medication visit.  Counseling provided for all of the vaccine components Orders Placed This Encounter  Procedures  . Bayer DCA Hb A1c Waived  . [place], MD [facility] 12/22/2018, 10:26 PM

## 2018-12-19 LAB — CMP14+EGFR
ALBUMIN: 4.2 g/dL (ref 3.8–4.9)
ALT: 27 IU/L (ref 0–44)
AST: 16 IU/L (ref 0–40)
Albumin/Globulin Ratio: 1.5 (ref 1.2–2.2)
Alkaline Phosphatase: 154 IU/L — ABNORMAL HIGH (ref 39–117)
BUN/Creatinine Ratio: 17 (ref 9–20)
BUN: 17 mg/dL (ref 6–24)
Bilirubin Total: 0.7 mg/dL (ref 0.0–1.2)
CO2: 24 mmol/L (ref 20–29)
Calcium: 9.3 mg/dL (ref 8.7–10.2)
Chloride: 99 mmol/L (ref 96–106)
Creatinine, Ser: 0.99 mg/dL (ref 0.76–1.27)
GFR calc Af Amer: 99 mL/min/{1.73_m2} (ref >59)
GFR calc non Af Amer: 86 mL/min/{1.73_m2} (ref >59)
Globulin, Total: 2.8 g/dL (ref 1.5–4.5)
Glucose: 259 mg/dL — ABNORMAL HIGH (ref 65–99)
POTASSIUM: 4.8 mmol/L (ref 3.5–5.2)
Sodium: 139 mmol/L (ref 134–144)
Total Protein: 7 g/dL (ref 6.0–8.5)

## 2018-12-24 DIAGNOSIS — Z794 Long term (current) use of insulin: Secondary | ICD-10-CM | POA: Diagnosis not present

## 2018-12-24 DIAGNOSIS — E1142 Type 2 diabetes mellitus with diabetic polyneuropathy: Secondary | ICD-10-CM | POA: Diagnosis not present

## 2018-12-24 DIAGNOSIS — E1165 Type 2 diabetes mellitus with hyperglycemia: Secondary | ICD-10-CM | POA: Diagnosis not present

## 2018-12-24 DIAGNOSIS — M14671 Charcot's joint, right ankle and foot: Secondary | ICD-10-CM | POA: Diagnosis not present

## 2018-12-24 DIAGNOSIS — M21071 Valgus deformity, not elsewhere classified, right ankle: Secondary | ICD-10-CM | POA: Diagnosis not present

## 2018-12-24 DIAGNOSIS — Z89512 Acquired absence of left leg below knee: Secondary | ICD-10-CM | POA: Diagnosis not present

## 2019-01-08 ENCOUNTER — Ambulatory Visit (INDEPENDENT_AMBULATORY_CARE_PROVIDER_SITE_OTHER): Payer: BLUE CROSS/BLUE SHIELD | Admitting: Family Medicine

## 2019-01-08 ENCOUNTER — Encounter: Payer: Self-pay | Admitting: Family Medicine

## 2019-01-08 VITALS — BP 132/74 | HR 94 | Temp 98.2°F | Ht 76.0 in | Wt 218.0 lb

## 2019-01-08 DIAGNOSIS — M79673 Pain in unspecified foot: Secondary | ICD-10-CM | POA: Diagnosis not present

## 2019-01-08 DIAGNOSIS — J101 Influenza due to other identified influenza virus with other respiratory manifestations: Secondary | ICD-10-CM

## 2019-01-08 DIAGNOSIS — G8929 Other chronic pain: Secondary | ICD-10-CM | POA: Diagnosis not present

## 2019-01-08 DIAGNOSIS — Z89512 Acquired absence of left leg below knee: Secondary | ICD-10-CM | POA: Diagnosis not present

## 2019-01-08 DIAGNOSIS — Z0289 Encounter for other administrative examinations: Secondary | ICD-10-CM

## 2019-01-08 DIAGNOSIS — E1142 Type 2 diabetes mellitus with diabetic polyneuropathy: Secondary | ICD-10-CM

## 2019-01-08 DIAGNOSIS — Z794 Long term (current) use of insulin: Secondary | ICD-10-CM

## 2019-01-08 LAB — VERITOR FLU A/B WAIVED
Influenza A: POSITIVE — AB
Influenza B: NEGATIVE

## 2019-01-08 LAB — BAYER DCA HB A1C WAIVED: HB A1C: 10.2 % — AB (ref <7.0)

## 2019-01-08 MED ORDER — ALBUTEROL SULFATE HFA 108 (90 BASE) MCG/ACT IN AERS: 2.0000 | INHALATION_SPRAY | Freq: Four times a day (QID) | RESPIRATORY_TRACT | 0 refills | Status: DC | PRN

## 2019-01-08 MED ORDER — HYDROCODONE-ACETAMINOPHEN 10-325 MG PO TABS: 1.0000 | ORAL_TABLET | Freq: Two times a day (BID) | ORAL | 0 refills | Status: DC | PRN

## 2019-01-08 MED ORDER — FLUTICASONE PROPIONATE 50 MCG/ACT NA SUSP: 1.0000 | Freq: Two times a day (BID) | NASAL | 6 refills | Status: AC | PRN

## 2019-01-08 MED ORDER — OSELTAMIVIR PHOSPHATE 75 MG PO CAPS: 75.0000 mg | ORAL_CAPSULE | Freq: Two times a day (BID) | ORAL | 0 refills | Status: DC

## 2019-01-08 NOTE — Addendum Note (Signed)
Addended by: Angela Adam on: 01/08/2019 02:28 PM   Modules accepted: Orders

## 2019-01-08 NOTE — Progress Notes (Signed)
BP 132/74   Pulse 94   Temp 98.2 F (36.8 C) (Oral)   Ht 6\' 4"  (1.93 m)   Wt 218 lb (98.9 kg)   SpO2 98%   BMI 26.54 kg/m    Subjective:    Patient ID: [name], male    DOB: 09-Mar-1966, 53 y.o.   MRN: [id number]  HPI: [name] is a 53 y.o. male presenting on 01/08/2019 for Cough (x 3 days); Nasal Congestion; Chills; Generalized Body Aches; Diarrhea; and pain mangment   HPI Chronic pain management Patient's morphine equivalent dose is 20, have patient re-sign a pain contract today.  He has pain rises a lot from his legs where he is had amputations, left below the knee and right transmetatarsal.  He does get some back pain as well but mostly it is in his legs and his foot that he gets the pain.  Will do drug screen today and checked database.  Cough and body ache and chills and congestion and diarrhea that has been going on for the past 2-1/2 to 3 days.  He has started with a cough and congestion that has worsened from there.  He has been having fevers and chills and has been taking Tylenol and ibuprofen for that.  He denies any shortness of breath but does have a lot of congestion in his chest and upper abdominal soreness that comes and goes.  He does not know any sick contacts because he is trying to stay in the house to try and stay with frequent that he knows of.  Patient feels like his symptoms are worse at night and the day bad during the day as well currently.  Relevant past medical, surgical, family and social history reviewed and updated as indicated. Interim medical history since our last visit reviewed. Allergies and medications reviewed and updated.  Review of Systems  Constitutional: Positive for chills and fever.  HENT: Positive for congestion, postnasal drip, rhinorrhea, sinus pressure, sneezing and sore throat. Negative for ear discharge, ear pain and voice change.   Eyes: Negative for pain, discharge, redness and visual disturbance.  Respiratory: Positive for  cough. Negative for chest tightness, shortness of breath and wheezing.   Cardiovascular: Negative for chest pain and leg swelling.  Gastrointestinal: Negative for abdominal pain, constipation and diarrhea.  Musculoskeletal: Positive for arthralgias, back pain and myalgias. Negative for gait problem.  Skin: Negative for rash.  Neurological: Negative for syncope, light-headedness and headaches.  All other systems reviewed and are negative.   Per HPI unless specifically indicated above   Allergies as of 01/08/2019      Reactions   Penicillins Other (See Comments)   Has patient had a PCN reaction causing immediate rash, facial/tongue/throat swelling, SOB or lightheadedness with hypotension: No Has patient had a PCN reaction causing severe rash involving mucus membranes or skin necrosis: No Has patient had a PCN reaction that required hospitalization No Has patient had a PCN reaction occurring within the last 10 years: No If all of the above answers are "NO", then may proceed with Cephalosporin use.      Medication List       Accurate as of January 08, 2019  1:26 PM. Always use your most recent med list.        albuterol 108 (90 Base) MCG/ACT inhaler Commonly known as:  PROVENTIL HFA;VENTOLIN HFA Inhale 2 puffs into the lungs every 6 (six) hours as needed for wheezing or shortness of breath.   aspirin EC  81 MG EC tablet Generic drug:  aspirin Take 81 mg by mouth daily. Swallow whole.   BLOOD GLUCOSE TEST STRIPS Strp 1 strip by In Vitro route 2 (two) times daily.   cyclobenzaprine 10 MG tablet Commonly known as:  FLEXERIL Take 1 tablet (10 mg total) by mouth at bedtime as needed for muscle spasms.   DULoxetine 60 MG capsule Commonly known as:  CYMBALTA Take 1 capsule (60 mg total) by mouth daily.   fluticasone 50 MCG/ACT nasal spray Commonly known as:  FLONASE Place 1 spray into both nostrils 2 (two) times daily as needed for allergies or rhinitis.   FREESTYLE LIBRE 14 DAY  READER Devi 1 each by Does not apply route daily.   FREESTYLE LIBRE 14 DAY SENSOR Misc 1 each by Does not apply route every 14 (fourteen) days.   gabapentin 600 MG tablet Commonly known as:  NEURONTIN Take 1 tablet (600 mg total) by mouth at bedtime.   HYDROcodone-acetaminophen 10-325 MG tablet Commonly known as:  NORCO Take 1 tablet by mouth 2 (two) times daily as needed. Do not refill until 30 days from prescription date   HYDROcodone-acetaminophen 10-325 MG tablet Commonly known as:  NORCO Take 1 tablet by mouth 2 (two) times daily as needed. Do not refill until 60 days from prescription date   HYDROcodone-acetaminophen 10-325 MG tablet Commonly known as:  NORCO Take 1 tablet by mouth 2 (two) times daily as needed for moderate pain.   ibuprofen 200 MG tablet Commonly known as:  ADVIL,MOTRIN Take 200 mg by mouth every 6 (six) hours as needed.   Insulin Glargine (2 Unit Dial) 300 UNIT/ML Sopn Commonly known as:  TOUJEO MAX SOLOSTAR Inject 40-50 Units into the skin daily.   Insulin Pen Needle 31G X 5 MM Misc Commonly known as:  B-D UF III MINI PEN NEEDLES 1 each by Does not apply route 4 (four) times daily.   lisinopril 5 MG tablet Commonly known as:  PRINIVIL,ZESTRIL Take 1 tablet (5 mg total) by mouth daily.   metFORMIN 1000 MG tablet Commonly known as:  GLUCOPHAGE Take 1 tablet (1,000 mg total) by mouth 2 (two) times daily.   multivitamin with minerals tablet Take 1 tablet by mouth daily.   oseltamivir 75 MG capsule Commonly known as:  TAMIFLU Take 1 capsule (75 mg total) by mouth 2 (two) times daily.   vitamin C 500 MG tablet Commonly known as:  ASCORBIC ACID Take 500 mg by mouth daily.   zinc gluconate 50 MG tablet Take 50 mg by mouth daily.          Objective:    BP 132/74   Pulse 94   Temp 98.2 F (36.8 C) (Oral)   Ht  (1.93 m)   Wt 218 lb (98.9 kg)   SpO2 98%   BMI 26.54 kg/m   Wt Readings from Last 3 Encounters:  01/08/19 218 lb  (98.9 kg)  12/18/18 219 lb 3.2 oz (99.4 kg)  11/18/18 221 lb (100.2 kg)    Physical Exam Vitals signs and nursing note reviewed.  Constitutional:      General: He is not in acute distress.    Appearance: He is well-developed. He is not diaphoretic.  Eyes:     General: No scleral icterus.    Conjunctiva/sclera: Conjunctivae normal.  Neck:     Musculoskeletal: Neck supple.     Thyroid: No thyromegaly.  Cardiovascular:     Rate and Rhythm: Normal rate and regular rhythm.  Heart sounds: Normal heart sounds. No murmur.  Pulmonary:     Effort: Pulmonary effort is normal. No respiratory distress.     Breath sounds: Rhonchi present. No wheezing.  Musculoskeletal: Normal range of motion.  Lymphadenopathy:     Cervical: No cervical adenopathy.  Skin:    General: Skin is warm and dry.     Findings: No rash.  Neurological:     Mental Status: He is alert and oriented to person, place, and time.     Coordination: Coordination normal.  Psychiatric:        Behavior: Behavior normal.         Assessment & Plan:   Problem List Items Addressed This Visit      Other   Pain management contract signed   Relevant Medications   HYDROcodone-acetaminophen (NORCO) 10-325 MG tablet   HYDROcodone-acetaminophen (NORCO) 10-325 MG tablet   HYDROcodone-acetaminophen (NORCO) 10-325 MG tablet   Other Relevant Orders   ToxASSURE Select 13 (MW), Urine   S/P BKA (below knee amputation) unilateral, left (HCC)   Relevant Medications   HYDROcodone-acetaminophen (NORCO) 10-325 MG tablet   HYDROcodone-acetaminophen (NORCO) 10-325 MG tablet   HYDROcodone-acetaminophen (NORCO) 10-325 MG tablet   Other Relevant Orders   ToxASSURE Select 13 (MW), Urine    Other Visit Diagnoses    Influenza A    -  Primary   Relevant Medications   albuterol (PROVENTIL HFA;VENTOLIN HFA) 108 (90 Base) MCG/ACT inhaler   oseltamivir (TAMIFLU) 75 MG capsule   fluticasone (FLONASE) 50 MCG/ACT nasal spray   Other Relevant  Orders   Veritor Flu A/B Waived   Chronic foot pain, unspecified laterality       Relevant Medications   HYDROcodone-acetaminophen (NORCO) 10-325 MG tablet   HYDROcodone-acetaminophen (NORCO) 10-325 MG tablet   HYDROcodone-acetaminophen (NORCO) 10-325 MG tablet   Other Relevant Orders   ToxASSURE Select 13 (MW), Urine      Continue Norco for the patient, will treat for flu  Follow up plan: Return in about 3 months (around 04/10/2019), or if symptoms worsen or fail to improve, for Pain management and diabetes recheck.  Counseling provided for all of the vaccine components Orders Placed This Encounter  Procedures  . Veritor Flu A/B Waived  . ToxASSURE Select 13 (MW), Urine    Arville Care, MD [facility] 01/08/2019, 1:26 PM

## 2019-01-14 LAB — TOXASSURE SELECT 13 (MW), URINE

## 2019-01-29 DIAGNOSIS — Z029 Encounter for administrative examinations, unspecified: Secondary | ICD-10-CM

## 2019-01-30 ENCOUNTER — Other Ambulatory Visit: Payer: Self-pay

## 2019-01-31 ENCOUNTER — Ambulatory Visit (INDEPENDENT_AMBULATORY_CARE_PROVIDER_SITE_OTHER): Payer: BLUE CROSS/BLUE SHIELD | Admitting: Family Medicine

## 2019-01-31 ENCOUNTER — Encounter: Payer: Self-pay | Admitting: Family Medicine

## 2019-01-31 VITALS — BP 132/74 | HR 100 | Temp 99.8°F | Ht 76.0 in | Wt 214.0 lb

## 2019-01-31 DIAGNOSIS — Z89512 Acquired absence of left leg below knee: Secondary | ICD-10-CM | POA: Diagnosis not present

## 2019-01-31 DIAGNOSIS — M79673 Pain in unspecified foot: Secondary | ICD-10-CM | POA: Diagnosis not present

## 2019-01-31 DIAGNOSIS — G8929 Other chronic pain: Secondary | ICD-10-CM | POA: Diagnosis not present

## 2019-01-31 DIAGNOSIS — M14671 Charcot's joint, right ankle and foot: Secondary | ICD-10-CM

## 2019-02-04 NOTE — Progress Notes (Signed)
BP 132/74   Pulse 100   Temp 99.8 F (37.7 C) (Oral)   Ht 6\' 4"  (1.93 m)   Wt 214 lb (97.1 kg)   BMI 26.05 kg/m    Subjective:   Patient ID: [name], male    DOB: 20-Feb-1966, 53 y.o.   MRN: [id number]  HPI: [name] is a 53 y.o. male presenting on 01/31/2019 for discuss disability paper work   HPI Patient comes in for disability paperwork.  He has a history of Charcot's foot of his left foot which then developed into a cellulitis which then ended up in a BKA of his left leg which he now has a prosthesis for and now currently he has a brace on his right ankle for Charcot's right ankle.  Because of this it makes it very difficult for him to ambulate or balance and push or pull anything and the recommendation is that he should be either not working or in a job where he could do intermittent sitting.  Patient is coming in today for this evaluation and for paperwork in concordance with this.  Patient has very significantly uncontrolled diabetes in the past which is starting to get better.  Relevant past medical, surgical, family and social history reviewed and updated as indicated. Interim medical history since our last visit reviewed. Allergies and medications reviewed and updated.  Review of Systems  Constitutional: Negative for chills and fever.  Respiratory: Negative for shortness of breath and wheezing.   Cardiovascular: Negative for chest pain and leg swelling.  Musculoskeletal: Positive for arthralgias and joint swelling. Negative for back pain and gait problem.  Skin: Negative for rash.  Neurological: Positive for numbness.  All other systems reviewed and are negative.   Per HPI unless specifically indicated above   Allergies as of 01/31/2019      Reactions   Penicillins Other (See Comments)   Has patient had a PCN reaction causing immediate rash, facial/tongue/throat swelling, SOB or lightheadedness with hypotension: No Has patient had a PCN reaction causing  severe rash involving mucus membranes or skin necrosis: No Has patient had a PCN reaction that required hospitalization No Has patient had a PCN reaction occurring within the last 10 years: No If all of the above answers are "NO", then may proceed with Cephalosporin use.      Medication List       Accurate as of January 31, 2019 11:59 PM. Always use your most recent med list.        albuterol 108 (90 Base) MCG/ACT inhaler Commonly known as:  PROVENTIL HFA;VENTOLIN HFA Inhale 2 puffs into the lungs every 6 (six) hours as needed for wheezing or shortness of breath.   aspirin EC 81 MG EC tablet Generic drug:  aspirin Take 81 mg by mouth daily. Swallow whole.   BLOOD GLUCOSE TEST STRIPS Strp 1 strip by In Vitro route 2 (two) times daily.   cyclobenzaprine 10 MG tablet Commonly known as:  FLEXERIL Take 1 tablet (10 mg total) by mouth at bedtime as needed for muscle spasms.   DULoxetine 60 MG capsule Commonly known as:  Cymbalta Take 1 capsule (60 mg total) by mouth daily.   fluticasone 50 MCG/ACT nasal spray Commonly known as:  FLONASE Place 1 spray into both nostrils 2 (two) times daily as needed for allergies or rhinitis.   FreeStyle Libre 14 Day Reader Hardie Pulley 1 each by Does not apply route daily.   FreeStyle Libre 14 Day Sensor Misc 1  each by Does not apply route every 14 (fourteen) days.   gabapentin 600 MG tablet Commonly known as:  NEURONTIN Take 1 tablet (600 mg total) by mouth at bedtime.   HYDROcodone-acetaminophen 10-325 MG tablet Commonly known as:  NORCO Take 1 tablet by mouth 2 (two) times daily as needed. Do not refill until 30 days from prescription date   HYDROcodone-acetaminophen 10-325 MG tablet Commonly known as:  NORCO Take 1 tablet by mouth 2 (two) times daily as needed. Do not refill until 60 days from prescription date   HYDROcodone-acetaminophen 10-325 MG tablet Commonly known as:  NORCO Take 1 tablet by mouth 2 (two) times daily as needed for  moderate pain.   ibuprofen 200 MG tablet Commonly known as:  ADVIL,MOTRIN Take 200 mg by mouth every 6 (six) hours as needed.   Insulin Glargine (2 Unit Dial) 300 UNIT/ML Sopn Commonly known as:  Toujeo Max SoloStar Inject 40-50 Units into the skin daily.   Insulin Pen Needle 31G X 5 MM Misc Commonly known as:  B-D UF III MINI PEN NEEDLES 1 each by Does not apply route 4 (four) times daily.   lisinopril 5 MG tablet Commonly known as:  PRINIVIL,ZESTRIL Take 1 tablet (5 mg total) by mouth daily.   metFORMIN 1000 MG tablet Commonly known as:  GLUCOPHAGE Take 1 tablet (1,000 mg total) by mouth 2 (two) times daily.   multivitamin with minerals tablet Take 1 tablet by mouth daily.   vitamin C 500 MG tablet Commonly known as:  ASCORBIC ACID Take 500 mg by mouth daily.   zinc gluconate 50 MG tablet Take 50 mg by mouth daily.        Objective:   BP 132/74   Pulse 100   Temp 99.8 F (37.7 C) (Oral)   Ht  (1.93 m)   Wt 214 lb (97.1 kg)   BMI 26.05 kg/m   Wt Readings from Last 3 Encounters:  01/31/19 214 lb (97.1 kg)  01/08/19 218 lb (98.9 kg)  12/18/18 219 lb 3.2 oz (99.4 kg)    Physical Exam Vitals signs and nursing note reviewed.  Constitutional:      General: He is not in acute distress.    Appearance: He is well-developed. He is not diaphoretic.  Eyes:     General: No scleral icterus.    Conjunctiva/sclera: Conjunctivae normal.  Neck:     Thyroid: No thyromegaly.  Cardiovascular:     Rate and Rhythm: Normal rate and regular rhythm.     Heart sounds: Normal heart sounds. No murmur.  Pulmonary:     Effort: Pulmonary effort is normal. No respiratory distress.     Breath sounds: Normal breath sounds. No wheezing.  Musculoskeletal: Normal range of motion.     Comments: Patient has status post left BKA with prosthesis on that leg, patient is in splint for Charcot's ankle of the right ankle with significant pain around that foot with swelling.  Skin:     General: Skin is warm and dry.     Findings: No rash.  Neurological:     Mental Status: He is alert and oriented to person, place, and time.     Coordination: Coordination normal.  Psychiatric:        Behavior: Behavior normal.       Assessment & Plan:   Problem List Items Addressed This Visit      Musculoskeletal and Integument   Charcot ankle - Primary     Other  S/P BKA (below knee amputation) unilateral, left (HCC)    Other Visit Diagnoses    Chronic foot pain, unspecified laterality        Recommend that the only kind of job that the patient could do is likely intermittent sitting and shorter spells because of his chronic back pain.  Follow up plan: Return if symptoms worsen or fail to improve.  Counseling provided for all of the vaccine components No orders of the defined types were placed in this encounter.   Arville Care, MD [facility] 02/04/2019, 4:28 PM

## 2019-04-18 ENCOUNTER — Ambulatory Visit (INDEPENDENT_AMBULATORY_CARE_PROVIDER_SITE_OTHER): Payer: BC Managed Care – PPO | Admitting: Family Medicine

## 2019-04-18 ENCOUNTER — Encounter: Payer: Self-pay | Admitting: Family Medicine

## 2019-04-18 DIAGNOSIS — Z89512 Acquired absence of left leg below knee: Secondary | ICD-10-CM | POA: Diagnosis not present

## 2019-04-18 DIAGNOSIS — E1142 Type 2 diabetes mellitus with diabetic polyneuropathy: Secondary | ICD-10-CM | POA: Diagnosis not present

## 2019-04-18 DIAGNOSIS — Z0289 Encounter for other administrative examinations: Secondary | ICD-10-CM

## 2019-04-18 DIAGNOSIS — Z794 Long term (current) use of insulin: Secondary | ICD-10-CM

## 2019-04-18 DIAGNOSIS — M79673 Pain in unspecified foot: Secondary | ICD-10-CM

## 2019-04-18 DIAGNOSIS — I10 Essential (primary) hypertension: Secondary | ICD-10-CM

## 2019-04-18 DIAGNOSIS — G8929 Other chronic pain: Secondary | ICD-10-CM

## 2019-04-18 MED ORDER — HYDROCODONE-ACETAMINOPHEN 10-325 MG PO TABS: 1.0000 | ORAL_TABLET | Freq: Two times a day (BID) | ORAL | 0 refills | Status: DC | PRN

## 2019-04-18 MED ORDER — TOUJEO MAX SOLOSTAR 300 UNIT/ML ~~LOC~~ SOPN: 40.0000 [IU] | PEN_INJECTOR | Freq: Every day | SUBCUTANEOUS | 5 refills | Status: DC

## 2019-04-18 NOTE — Progress Notes (Signed)
Virtual Visit via telephone Note  I connected with [name] on 04/18/19 at 1041 by telephone and verified that I am speaking with the correct person using two identifiers. [name] is currently located at home and no other people are currently with her during visit. The provider, [name] Dettinger, MD is located in their office at time of visit.  Call ended at 1100  I discussed the limitations, risks, security and privacy concerns of performing an evaluation and management service by telephone and the availability of in person appointments. I also discussed with the patient that there may be a patient responsible charge related to this service. The patient expressed understanding and agreed to proceed.   History and Present Illness: Type 2 diabetes mellitus Patient comes in today for recheck of his diabetes. Patient has been currently taking toujeo 54 units, he started 2 weeks ago. Patient is currently on an ACE inhibitor/ARB. Patient has not seen an ophthalmologist this year. Patient denies any new issues with their feet. Patient is having trouble with the freestyle libre app because he can't access it on his phone. He was getting 270 or 280 after he ate.  Now on new insulin it is about 230-240 after eating. He has not had symptoms of hyperglycemia blurred vision since starting it.  Hypertension Patient is currently on lisinopril, and their blood pressure today is unknown because he does not have a cuff at home. Patient denies any lightheadedness or dizziness. Patient denies headaches, blurred vision, chest pains, shortness of breath, or weakness. Denies any side effects from medication and is content with current medication.   Pain assessment: Cause of pain-Charcot foot of the right, status post BKA of the left and back pain from arthritis Pain location-right foot and left leg and lower back Pain on scale of 1-10- 3 Frequency-Daily What increases pain-it comes in waves sometimes  associated with the neuropathy sometimes associated with arthritis flareups What makes pain Better-the medication and exercising and better prosthetic has helped Effects on ADL -has a lot of limitations because of right foot Charcot and left prosthetic leg but tries to do as much as he can Any change in general medical condition-Charcot foot right  Current opioids rx-Norco 10-3 25, twice daily PRN # meds rx-60 Effectiveness of current meds-working well Adverse reactions form pain meds-none Morphine equivalent-20  Pill count performed-No Last drug screen -01/08/2019 ( high risk q18m, moderate risk q43m, low risk yearly ) Urine drug screen today- No Was the [place] reviewed-yes  If yes were their any concerning findings? -None  No flowsheet data found.   Pain contract signed on: 01/08/2019  No diagnosis found.  Outpatient Encounter Medications as of 04/18/2019  Medication Sig  . albuterol (PROVENTIL HFA;VENTOLIN HFA) 108 (90 Base) MCG/ACT inhaler Inhale 2 puffs into the lungs every 6 (six) hours as needed for wheezing or shortness of breath.  Marland Kitchen aspirin (ASPIRIN EC) 81 MG EC tablet Take 81 mg by mouth daily. Swallow whole.  . Continuous Blood Gluc Receiver (FREESTYLE LIBRE 14 DAY READER) DEVI 1 each by Does not apply route daily.  . Continuous Blood Gluc Sensor (FREESTYLE LIBRE 14 DAY SENSOR) MISC 1 each by Does not apply route every 14 (fourteen) days.  . cyclobenzaprine (FLEXERIL) 10 MG tablet Take 1 tablet (10 mg total) by mouth at bedtime as needed for muscle spasms.  . DULoxetine (CYMBALTA) 60 MG capsule Take 1 capsule (60 mg total) by mouth daily.  . fluticasone (FLONASE) 50 MCG/ACT  nasal spray Place 1 spray into both nostrils 2 (two) times daily as needed for allergies or rhinitis.  Marland Kitchen. gabapentin (NEURONTIN) 600 MG tablet Take 1 tablet (600 mg total) by mouth at bedtime.  . Glucose Blood (BLOOD GLUCOSE TEST STRIPS) STRP 1 strip by In Vitro route 2 (two) times daily.  Marland Kitchen.  HYDROcodone-acetaminophen (NORCO) 10-325 MG tablet Take 1 tablet by mouth 2 (two) times daily as needed. Do not refill until 30 days from prescription date  . HYDROcodone-acetaminophen (NORCO) 10-325 MG tablet Take 1 tablet by mouth 2 (two) times daily as needed. Do not refill until 60 days from prescription date  . HYDROcodone-acetaminophen (NORCO) 10-325 MG tablet Take 1 tablet by mouth 2 (two) times daily as needed for moderate pain.  Marland Kitchen. ibuprofen (ADVIL,MOTRIN) 200 MG tablet Take 200 mg by mouth every 6 (six) hours as needed.  . Insulin Glargine, 2 Unit Dial, (TOUJEO MAX SOLOSTAR) 300 UNIT/ML SOPN Inject 40-50 Units into the skin daily.  . Insulin Pen Needle (B-D UF III MINI PEN NEEDLES) 31G X 5 MM MISC 1 each by Does not apply route 4 (four) times daily.  Marland Kitchen. lisinopril (PRINIVIL,ZESTRIL) 5 MG tablet Take 1 tablet (5 mg total) by mouth daily.  . metFORMIN (GLUCOPHAGE) 1000 MG tablet Take 1 tablet (1,000 mg total) by mouth 2 (two) times daily.  . Multiple Vitamins-Minerals (MULTIVITAMIN WITH MINERALS) tablet Take 1 tablet by mouth daily.  . vitamin C (ASCORBIC ACID) 500 MG tablet Take 500 mg by mouth daily.  Marland Kitchen. zinc gluconate 50 MG tablet Take 50 mg by mouth daily.   No facility-administered encounter medications on file as of 04/18/2019.     Review of Systems  Constitutional: Negative for chills and fever.  Respiratory: Negative for shortness of breath and wheezing.   Cardiovascular: Negative for chest pain and leg swelling.  Musculoskeletal: Positive for arthralgias and back pain. Negative for gait problem.  Skin: Negative for rash.  Neurological: Positive for numbness. Negative for dizziness and weakness.  All other systems reviewed and are negative.   Observations/Objective: Patient sounds comfortable and in no acute distress  Assessment and Plan: Problem List Items Addressed This Visit      Cardiovascular and Mediastinum   Essential hypertension, benign     Endocrine   Type 2  diabetes mellitus (HCC) - Primary   Relevant Medications   Insulin Glargine, 2 Unit Dial, (TOUJEO MAX SOLOSTAR) 300 UNIT/ML SOPN     Other   Pain management contract signed   Relevant Medications   HYDROcodone-acetaminophen (NORCO) 10-325 MG tablet   HYDROcodone-acetaminophen (NORCO) 10-325 MG tablet   HYDROcodone-acetaminophen (NORCO) 10-325 MG tablet   S/P BKA (below knee amputation) unilateral, left (HCC)   Relevant Medications   HYDROcodone-acetaminophen (NORCO) 10-325 MG tablet   HYDROcodone-acetaminophen (NORCO) 10-325 MG tablet   HYDROcodone-acetaminophen (NORCO) 10-325 MG tablet    Other Visit Diagnoses    Chronic foot pain, unspecified laterality       Relevant Medications   HYDROcodone-acetaminophen (NORCO) 10-325 MG tablet   HYDROcodone-acetaminophen (NORCO) 10-325 MG tablet   HYDROcodone-acetaminophen (NORCO) 10-325 MG tablet       Follow Up Instructions: Just started the Toujeo 2 weeks ago, will watch for hypoglycemia but it sounds like blood sugars are improving  Continue hydrocodone.  Patient is going to try to get a blood pressure cuff    I discussed the assessment and treatment plan with the patient. The patient was provided an opportunity to ask questions and all were answered.  The patient agreed with the plan and demonstrated an understanding of the instructions.   The patient was advised to call back or seek an in-person evaluation if the symptoms worsen or if the condition fails to improve as anticipated.  The above assessment and management plan was discussed with the patient. The patient verbalized understanding of and has agreed to the management plan. Patient is aware to call the clinic if symptoms persist or worsen. Patient is aware when to return to the clinic for a follow-up visit. Patient educated on when it is appropriate to go to the emergency department.    I provided 19 minutes of non-face-to-face time during this encounter.    Nils PyleJoshua A  Dettinger, MD

## 2019-04-21 ENCOUNTER — Telehealth: Payer: Self-pay | Admitting: Family Medicine

## 2019-04-21 NOTE — Telephone Encounter (Signed)
Patient aware we do not know his blood type.

## 2019-05-05 ENCOUNTER — Telehealth: Payer: Self-pay | Admitting: Family Medicine

## 2019-05-06 DIAGNOSIS — E1142 Type 2 diabetes mellitus with diabetic polyneuropathy: Secondary | ICD-10-CM | POA: Diagnosis not present

## 2019-05-06 DIAGNOSIS — L603 Nail dystrophy: Secondary | ICD-10-CM | POA: Diagnosis not present

## 2019-05-22 ENCOUNTER — Other Ambulatory Visit: Payer: Self-pay | Admitting: Family Medicine

## 2019-05-26 NOTE — Telephone Encounter (Signed)
Saw [facility] last week, needing new equipment. Will see if we have received ppw if not will call for ppw to sign

## 2019-05-27 ENCOUNTER — Telehealth: Payer: Self-pay | Admitting: Family Medicine

## 2019-05-27 NOTE — Telephone Encounter (Signed)
Pt aware Rx for prosethetics to [facility]

## 2019-05-28 ENCOUNTER — Telehealth: Payer: Self-pay | Admitting: Family Medicine

## 2019-05-28 NOTE — Telephone Encounter (Signed)
Apt scheduled.  

## 2019-05-28 NOTE — Telephone Encounter (Signed)
Spoke with pt regarding appt Pt informed that RX was faxed to [facility] Pt will contact [facility] regarding RX Pt will call back if he needs appt scheduled

## 2019-06-02 DIAGNOSIS — E119 Type 2 diabetes mellitus without complications: Secondary | ICD-10-CM | POA: Diagnosis not present

## 2019-06-02 DIAGNOSIS — H43393 Other vitreous opacities, bilateral: Secondary | ICD-10-CM | POA: Diagnosis not present

## 2019-06-02 LAB — HM DIABETES EYE EXAM

## 2019-06-03 ENCOUNTER — Other Ambulatory Visit: Payer: Self-pay

## 2019-06-03 DIAGNOSIS — E1142 Type 2 diabetes mellitus with diabetic polyneuropathy: Secondary | ICD-10-CM | POA: Diagnosis not present

## 2019-06-03 DIAGNOSIS — Z89512 Acquired absence of left leg below knee: Secondary | ICD-10-CM | POA: Diagnosis not present

## 2019-06-03 DIAGNOSIS — M14671 Charcot's joint, right ankle and foot: Secondary | ICD-10-CM | POA: Diagnosis not present

## 2019-06-03 DIAGNOSIS — Z794 Long term (current) use of insulin: Secondary | ICD-10-CM | POA: Diagnosis not present

## 2019-06-03 DIAGNOSIS — E1165 Type 2 diabetes mellitus with hyperglycemia: Secondary | ICD-10-CM | POA: Diagnosis not present

## 2019-06-03 DIAGNOSIS — M21071 Valgus deformity, not elsewhere classified, right ankle: Secondary | ICD-10-CM | POA: Diagnosis not present

## 2019-06-05 ENCOUNTER — Encounter: Payer: Self-pay | Admitting: Family Medicine

## 2019-06-05 ENCOUNTER — Ambulatory Visit: Payer: BC Managed Care – PPO | Admitting: Family Medicine

## 2019-06-05 ENCOUNTER — Other Ambulatory Visit: Payer: Self-pay

## 2019-06-05 VITALS — BP 143/83 | HR 82 | Temp 97.3°F | Ht 76.0 in | Wt 213.2 lb

## 2019-06-05 DIAGNOSIS — Z89512 Acquired absence of left leg below knee: Secondary | ICD-10-CM

## 2019-06-05 NOTE — Progress Notes (Signed)
BP (!) 143/83   Pulse 82   Temp (!) 97.3 F (36.3 C) (Temporal)   Ht 6\' 4"  (1.93 m)   Wt 213 lb 3.2 oz (96.7 kg)   BMI 25.95 kg/m    Subjective:   Patient ID: Anthony RavensKeith B Delay, male    DOB: 1966/05/12, 53 y.o.   MRN: [id number]  HPI: [name] is a 53 y.o. male presenting on 06/05/2019 for rx for prostetic leg   HPI Patient is coming in for a new prescription for his prosthetic leg and socks.  He says that his prosthetic leg is wearing down and stretching out and start to become loose again and some of the socks and liners are starting to wear down and have holes in them again and it is time for a new prescription for these.  He says that his socket has been doing well for him when he gets a new one and it lasts him just over a year.  Patient does maintain mobility and activity and because of that wears through these.  Relevant past medical, surgical, family and social history reviewed and updated as indicated. Interim medical history since our last visit reviewed. Allergies and medications reviewed and updated.  Review of Systems  Constitutional: Negative for chills and fever.  Respiratory: Negative for shortness of breath and wheezing.   Cardiovascular: Negative for chest pain and leg swelling.  Musculoskeletal: Positive for arthralgias. Negative for back pain and gait problem.  Skin: Negative for rash and wound.  All other systems reviewed and are negative.   Per HPI unless specifically indicated above   Allergies as of 06/05/2019   No Active Allergies     Medication List       Accurate as of June 05, 2019  9:04 AM. If you have any questions, ask your nurse or doctor.        albuterol 108 (90 Base) MCG/ACT inhaler Commonly known as: VENTOLIN HFA Inhale 2 puffs into the lungs every 6 (six) hours as needed for wheezing or shortness of breath.   aspirin EC 81 MG EC tablet Generic drug: aspirin Take 81 mg by mouth daily. Swallow whole.   BLOOD GLUCOSE TEST  STRIPS Strp 1 strip by In Vitro route 2 (two) times daily.   cyclobenzaprine 10 MG tablet Commonly known as: FLEXERIL Take 1 tablet (10 mg total) by mouth at bedtime as needed for muscle spasms.   DULoxetine 60 MG capsule Commonly known as: Cymbalta Take 1 capsule (60 mg total) by mouth daily.   fluticasone 50 MCG/ACT nasal spray Commonly known as: FLONASE Place 1 spray into both nostrils 2 (two) times daily as needed for allergies or rhinitis.   FreeStyle Libre 14 Day Reader Hardie Pulleyevi 1 each by Does not apply route daily.   FreeStyle Libre 14 Day Sensor Misc 1 each by Does not apply route every 14 (fourteen) days.   gabapentin 600 MG tablet Commonly known as: NEURONTIN Take 1 tablet (600 mg total) by mouth at bedtime.   HYDROcodone-acetaminophen 10-325 MG tablet Commonly known as: NORCO Take 1 tablet by mouth 2 (two) times daily as needed. Do not refill until 30 days from prescription date   HYDROcodone-acetaminophen 10-325 MG tablet Commonly known as: NORCO Take 1 tablet by mouth 2 (two) times daily as needed. Do not refill until 60 days from prescription date   HYDROcodone-acetaminophen 10-325 MG tablet Commonly known as: NORCO Take 1 tablet by mouth 2 (two) times daily as needed for moderate  pain.   ibuprofen 200 MG tablet Commonly known as: ADVIL Take 200 mg by mouth every 6 (six) hours as needed.   Insulin Pen Needle 31G X 5 MM Misc Commonly known as: B-D UF III MINI PEN NEEDLES 1 each by Does not apply route 4 (four) times daily.   lisinopril 5 MG tablet Commonly known as: ZESTRIL Take 1 tablet (5 mg total) by mouth daily.   metFORMIN 1000 MG tablet Commonly known as: GLUCOPHAGE Take 1 tablet (1,000 mg total) by mouth 2 (two) times daily.   multivitamin with minerals tablet Take 1 tablet by mouth daily.   Toujeo Max SoloStar 300 UNIT/ML Sopn Generic drug: Insulin Glargine (2 Unit Dial) Inject 40-50 Units into the skin daily.   vitamin C 500 MG tablet  Commonly known as: ASCORBIC ACID Take 500 mg by mouth daily.   zinc gluconate 50 MG tablet Take 50 mg by mouth daily.        Objective:   BP (!) 143/83   Pulse 82   Temp (!) 97.3 F (36.3 C) (Temporal)   Ht 6\' 4"  (1.93 m)   Wt 213 lb 3.2 oz (96.7 kg)   BMI 25.95 kg/m   Wt Readings from Last 3 Encounters:  06/05/19 213 lb 3.2 oz (96.7 kg)  01/31/19 214 lb (97.1 kg)  01/08/19 218 lb (98.9 kg)    Physical Exam Vitals signs and nursing note reviewed.  Constitutional:      General: He is not in acute distress.    Appearance: He is well-developed. He is not diaphoretic.  Eyes:     General: No scleral icterus.    Conjunctiva/sclera: Conjunctivae normal.  Neck:     Thyroid: No thyromegaly.  Musculoskeletal: Normal range of motion.        General: Deformity (Left BKA) present.  Skin:    General: Skin is warm and dry.     Findings: No rash.  Neurological:     Mental Status: He is alert and oriented to person, place, and time.     Coordination: Coordination normal.  Psychiatric:        Behavior: Behavior normal.       Assessment & Plan:   Problem List Items Addressed This Visit      Other   S/P BKA (below knee amputation) unilateral, left ([place]) - Primary      Did prescription for a new sockets and socks Follow up plan: Return in about 2 months (around 08/06/2019), or if symptoms worsen or fail to improve, for Diabetic recheck.  Counseling provided for all of the vaccine components No orders of the defined types were placed in this encounter.   Caryl Pina, MD [facility] 06/05/2019, 9:04 AM

## 2019-06-27 ENCOUNTER — Other Ambulatory Visit: Payer: Self-pay

## 2019-06-30 ENCOUNTER — Ambulatory Visit (INDEPENDENT_AMBULATORY_CARE_PROVIDER_SITE_OTHER): Payer: BC Managed Care – PPO | Admitting: *Deleted

## 2019-06-30 ENCOUNTER — Other Ambulatory Visit: Payer: Self-pay

## 2019-06-30 DIAGNOSIS — Z23 Encounter for immunization: Secondary | ICD-10-CM

## 2019-06-30 NOTE — Progress Notes (Signed)
Pt given Shingrix vaccine Tolerated well 

## 2019-07-07 ENCOUNTER — Ambulatory Visit: Payer: BC Managed Care – PPO

## 2019-07-17 ENCOUNTER — Other Ambulatory Visit: Payer: Self-pay

## 2019-07-17 ENCOUNTER — Ambulatory Visit (INDEPENDENT_AMBULATORY_CARE_PROVIDER_SITE_OTHER): Payer: BC Managed Care – PPO | Admitting: Family Medicine

## 2019-07-17 ENCOUNTER — Encounter: Payer: Self-pay | Admitting: Family Medicine

## 2019-07-17 DIAGNOSIS — L03116 Cellulitis of left lower limb: Secondary | ICD-10-CM | POA: Diagnosis not present

## 2019-07-17 DIAGNOSIS — B359 Dermatophytosis, unspecified: Secondary | ICD-10-CM

## 2019-07-17 MED ORDER — TERBINAFINE HCL 1 % EX CREA: 1.0000 "application " | TOPICAL_CREAM | Freq: Two times a day (BID) | CUTANEOUS | 0 refills | Status: DC

## 2019-07-17 MED ORDER — CEPHALEXIN 500 MG PO CAPS: 500.0000 mg | ORAL_CAPSULE | Freq: Four times a day (QID) | ORAL | 0 refills | Status: DC

## 2019-07-17 NOTE — Progress Notes (Signed)
Virtual Visit via telephone Note  I connected with [name] on 07/17/19 at 1440 by telephone and verified that I am speaking with the correct person using two identifiers. [name] is currently located at home and no other people are currently with her during visit. The provider, [name] Dettinger, MD is located in their office at time of visit.  Call ended at 1450  I discussed the limitations, risks, security and privacy concerns of performing an evaluation and management service by telephone and the availability of in person appointments. I also discussed with the patient that there may be a patient responsible charge related to this service. The patient expressed understanding and agreed to proceed.   History and Present Illness: Patient awoke this morning with a rash on his left leg and it has little red circles and it is not itchy.  He denies any fevers or chills. Patient has amputations secondary to diabetes.  He put band aids on it. He was with his neighbors dog and nieces. He denies redness and warmth or fevers or chills. He denies any open sores.   No diagnosis found.  Outpatient Encounter Medications as of 07/17/2019  Medication Sig  . albuterol (PROVENTIL HFA;VENTOLIN HFA) 108 (90 Base) MCG/ACT inhaler Inhale 2 puffs into the lungs every 6 (six) hours as needed for wheezing or shortness of breath.  Marland Kitchen aspirin (ASPIRIN EC) 81 MG EC tablet Take 81 mg by mouth daily. Swallow whole.  . Continuous Blood Gluc Receiver (FREESTYLE LIBRE 14 DAY READER) DEVI 1 each by Does not apply route daily.  . Continuous Blood Gluc Sensor (FREESTYLE LIBRE 14 DAY SENSOR) MISC 1 each by Does not apply route every 14 (fourteen) days.  . cyclobenzaprine (FLEXERIL) 10 MG tablet Take 1 tablet (10 mg total) by mouth at bedtime as needed for muscle spasms.  . DULoxetine (CYMBALTA) 60 MG capsule Take 1 capsule (60 mg total) by mouth daily.  . fluticasone (FLONASE) 50 MCG/ACT nasal spray Place 1 spray  into both nostrils 2 (two) times daily as needed for allergies or rhinitis.  Marland Kitchen gabapentin (NEURONTIN) 600 MG tablet Take 1 tablet (600 mg total) by mouth at bedtime.  . Glucose Blood (BLOOD GLUCOSE TEST STRIPS) STRP 1 strip by In Vitro route 2 (two) times daily.  Marland Kitchen HYDROcodone-acetaminophen (NORCO) 10-325 MG tablet Take 1 tablet by mouth 2 (two) times daily as needed. Do not refill until 30 days from prescription date  . HYDROcodone-acetaminophen (NORCO) 10-325 MG tablet Take 1 tablet by mouth 2 (two) times daily as needed. Do not refill until 60 days from prescription date  . HYDROcodone-acetaminophen (NORCO) 10-325 MG tablet Take 1 tablet by mouth 2 (two) times daily as needed for moderate pain.  Marland Kitchen ibuprofen (ADVIL,MOTRIN) 200 MG tablet Take 200 mg by mouth every 6 (six) hours as needed.  . Insulin Glargine, 2 Unit Dial, (TOUJEO MAX SOLOSTAR) 300 UNIT/ML SOPN Inject 40-50 Units into the skin daily.  . Insulin Pen Needle (B-D UF III MINI PEN NEEDLES) 31G X 5 MM MISC 1 each by Does not apply route 4 (four) times daily.  Marland Kitchen lisinopril (PRINIVIL,ZESTRIL) 5 MG tablet Take 1 tablet (5 mg total) by mouth daily.  . metFORMIN (GLUCOPHAGE) 1000 MG tablet Take 1 tablet (1,000 mg total) by mouth 2 (two) times daily.  . Multiple Vitamins-Minerals (MULTIVITAMIN WITH MINERALS) tablet Take 1 tablet by mouth daily.  . vitamin C (ASCORBIC ACID) 500 MG tablet Take 500 mg by mouth daily.  Marland Kitchen  zinc gluconate 50 MG tablet Take 50 mg by mouth daily.   No facility-administered encounter medications on file as of 07/17/2019.     Review of Systems  Constitutional: Negative for chills and fever.  Respiratory: Negative for shortness of breath and wheezing.   Cardiovascular: Negative for chest pain and leg swelling.  Musculoskeletal: Negative for back pain and gait problem.  Skin: Positive for color change and rash. Negative for wound.  Neurological: Negative for dizziness, weakness and numbness.  All other systems  reviewed and are negative.   Observations/Objective: Patient sounds comfortable and in no acute distress  Assessment and Plan: Problem List Items Addressed This Visit    None    Visit Diagnoses    Ringworm    -  Primary   Relevant Medications   terbinafine (LAMISIL AT) 1 % cream   cephALEXin (KEFLEX) 500 MG capsule   Cellulitis of left lower extremity       Relevant Medications   cephALEXin (KEFLEX) 500 MG capsule       Follow Up Instructions: As needed, call if worsens    I discussed the assessment and treatment plan with the patient. The patient was provided an opportunity to ask questions and all were answered. The patient agreed with the plan and demonstrated an understanding of the instructions.   The patient was advised to call back or seek an in-person evaluation if the symptoms worsen or if the condition fails to improve as anticipated.  The above assessment and management plan was discussed with the patient. The patient verbalized understanding of and has agreed to the management plan. Patient is aware to call the clinic if symptoms persist or worsen. Patient is aware when to return to the clinic for a follow-up visit. Patient educated on when it is appropriate to go to the emergency department.    I provided 10 minutes of non-face-to-face time during this encounter.    Nils PyleJoshua A Dettinger, MD

## 2019-07-21 ENCOUNTER — Other Ambulatory Visit: Payer: Self-pay

## 2019-07-22 ENCOUNTER — Ambulatory Visit: Payer: BC Managed Care – PPO | Admitting: Family Medicine

## 2019-07-22 ENCOUNTER — Encounter: Payer: Self-pay | Admitting: Family Medicine

## 2019-07-22 VITALS — BP 112/74 | HR 100 | Temp 98.0°F | Resp 20 | Ht 76.0 in | Wt 210.0 lb

## 2019-07-22 DIAGNOSIS — G8929 Other chronic pain: Secondary | ICD-10-CM | POA: Diagnosis not present

## 2019-07-22 DIAGNOSIS — Z23 Encounter for immunization: Secondary | ICD-10-CM

## 2019-07-22 DIAGNOSIS — M79673 Pain in unspecified foot: Secondary | ICD-10-CM

## 2019-07-22 DIAGNOSIS — I10 Essential (primary) hypertension: Secondary | ICD-10-CM | POA: Diagnosis not present

## 2019-07-22 DIAGNOSIS — Z89512 Acquired absence of left leg below knee: Secondary | ICD-10-CM

## 2019-07-22 DIAGNOSIS — Z0289 Encounter for other administrative examinations: Secondary | ICD-10-CM

## 2019-07-22 DIAGNOSIS — E1142 Type 2 diabetes mellitus with diabetic polyneuropathy: Secondary | ICD-10-CM | POA: Diagnosis not present

## 2019-07-22 DIAGNOSIS — Z794 Long term (current) use of insulin: Secondary | ICD-10-CM | POA: Diagnosis not present

## 2019-07-22 DIAGNOSIS — E1140 Type 2 diabetes mellitus with diabetic neuropathy, unspecified: Secondary | ICD-10-CM

## 2019-07-22 LAB — BAYER DCA HB A1C WAIVED: HB A1C (BAYER DCA - WAIVED): 12.4 % — ABNORMAL HIGH (ref <7.0)

## 2019-07-22 MED ORDER — HYDROCODONE-ACETAMINOPHEN 10-325 MG PO TABS: 1.0000 | ORAL_TABLET | Freq: Two times a day (BID) | ORAL | 0 refills | Status: DC | PRN

## 2019-07-22 MED ORDER — GABAPENTIN 600 MG PO TABS: 600.0000 mg | ORAL_TABLET | Freq: Every day | ORAL | 3 refills | Status: DC

## 2019-07-22 MED ORDER — ATORVASTATIN CALCIUM 20 MG PO TABS: 20.0000 mg | ORAL_TABLET | Freq: Every day | ORAL | 3 refills | Status: DC

## 2019-07-22 NOTE — Addendum Note (Signed)
Addended by: Zannie Cove on: 07/22/2019 01:50 PM   Modules accepted: Orders

## 2019-07-22 NOTE — Progress Notes (Signed)
BP 112/74   Pulse 100   Temp 98 F (36.7 C)   Resp 20   Ht '6\' 4"'$  (1.93 m)   Wt 210 lb (95.3 kg)   SpO2 98%   BMI 25.56 kg/m    Subjective:   Patient ID: [name], male    DOB: April 11, 1966, 53 y.o.   MRN: [id number]  HPI: [name] is a 53 y.o. male presenting on 07/22/2019 for Medical Management of Chronic Issues, Hypertension, and Diabetes   HPI Pain assessment: Cause of pain-bilateral foot pain from amputations and degenerative disc disease Pain location-low back and bilateral legs and feet Pain on scale of 1-10- 5 Frequency-Daily, constant What increases pain-standing walking What makes pain Better-medication and resting Effects on ADL -he has a prosthesis which she does pretty well with but the pain does limit him some in the distances that he can walk. Any change in general medical condition-uncontrolled diabetes  Current opioids rx-hydrocodone-acetaminophen 10-3 25 twice daily as needed # meds rx-60 Effectiveness of current meds-working well Adverse reactions form pain meds-none Morphine equivalent-20 mg  Pill count performed-No Last drug screen -01/09/2019 ( high risk q11m moderate risk q613mlow risk yearly ) [place] today- [place]  [place] were [place] any concerning findings? -Has been slightly early on the past couple months  No flowsheet data found.   Pain contract signed on: 01/09/2019  Type 2 diabetes mellitus Patient comes in today for recheck of his diabetes. Patient has been currently taking Toujeo 60 units. Patient is currently on an ACE inhibitor/ARB. Patient has seen an ophthalmologist this year. 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 Patient denies any lightheadedness or dizziness. Patient  denies headaches, blurred vision, chest pains, shortness of breath, or weakness. Denies any side effects from medication and is content with current medication.   Relevant past medical, surgical, family and social history reviewed and updated as indicated. Interim medical history since our last visit reviewed. Allergies and medications reviewed and updated.  Review of Systems  Constitutional: Negative for chills and fever.  Eyes: Negative for visual disturbance.  Respiratory: Negative for shortness of breath and wheezing.   Cardiovascular: Negative for chest pain and leg swelling.  Musculoskeletal: Positive for arthralgias and back pain.  Skin: Negative for rash and wound.  Neurological: Positive for numbness. Negative for dizziness, weakness and light-headedness.  All other systems reviewed and are negative.   Per HPI unless specifically indicated above   Allergies as of 07/22/2019   No Known Allergies     Medication List       Accurate as of July 22, 2019  1:18 PM. If you have any questions, ask your nurse or doctor.        STOP taking these medications   BLOOD GLUCOSE TEST STRIPS Strp Stopped by: JoFransisca Kaufmannettinger, MD   cephALEXin 500 MG capsule Commonly known as: KEFLEX Stopped by: JoFransisca Kaufmannettinger, MD   vitamin C 500 MG tablet Commonly known as: ASCORBIC ACID Stopped by: JoFransisca Kaufmannettinger, MD     TAKE these medications   albuterol 108 (90 Base) MCG/ACT inhaler Commonly known as: VENTOLIN HFA Inhale 2 puffs into the lungs every 6 (six) hours as  needed for wheezing or shortness of breath.   aspirin EC 81 MG EC tablet Generic drug: aspirin Take 81 mg by mouth daily. Swallow whole.   cyclobenzaprine 10 MG tablet Commonly known as: FLEXERIL Take 1 tablet (10 mg total) by mouth at bedtime as needed for muscle spasms.   DULoxetine 60 MG capsule Commonly known as: Cymbalta Take 1 capsule (60 mg total) by mouth daily.   fluticasone 50 MCG/ACT nasal  spray Commonly known as: FLONASE Place 1 spray into both nostrils 2 (two) times daily as needed for allergies or rhinitis.   FreeStyle Libre 14 Day Reader [name] 1 each by Does not apply route daily.   FreeStyle Libre 14 Day Sensor Misc 1 each by Does not apply route every 14 (fourteen) days.   gabapentin 600 MG tablet Commonly known as: NEURONTIN Take 1 tablet (600 mg total) by mouth at bedtime.   HYDROcodone-acetaminophen 10-325 MG tablet Commonly known as: NORCO Take 1 tablet by mouth 2 (two) times daily as needed. Do not refill until 30 days from prescription date   HYDROcodone-acetaminophen 10-325 MG tablet Commonly known as: NORCO Take 1 tablet by mouth 2 (two) times daily as needed. Do not refill until 60 days from prescription date   HYDROcodone-acetaminophen 10-325 MG tablet Commonly known as: NORCO Take 1 tablet by mouth 2 (two) times daily as needed for moderate pain.   ibuprofen 200 MG tablet Commonly known as: ADVIL Take 200 mg by mouth every 6 (six) hours as needed.   Insulin Pen Needle 31G X 5 MM Misc Commonly known as: B-D UF III MINI PEN NEEDLES 1 each by Does not apply route 4 (four) times daily.   lisinopril 5 MG tablet Commonly known as: ZESTRIL Take 1 tablet (5 mg total) by mouth daily.   metFORMIN 1000 MG tablet Commonly known as: GLUCOPHAGE Take 1 tablet (1,000 mg total) by mouth 2 (two) times daily.   multivitamin with minerals tablet Take 1 tablet by mouth daily.   terbinafine 1 % cream Commonly known as: LamISIL AT Apply 1 application topically 2 (two) times daily.   Toujeo Max SoloStar 300 UNIT/ML Sopn Generic drug: Insulin Glargine (2 Unit Dial) Inject 40-50 Units into the skin daily.   zinc gluconate 50 MG tablet Take 50 mg by mouth daily.        Objective:   BP 112/74   Pulse 100   Temp 98 F (36.7 C)   Resp 20   Ht _0  (1.93 m)   Wt 210 lb (95.3 kg)   SpO2 98%   BMI 25.56 kg/m   Wt Readings from Last 3 Encounters:   07/22/19 210 lb (95.3 kg)  06/05/19 213 lb 3.2 oz (96.7 kg)  01/31/19 214 lb (97.1 kg)    Physical Exam Vitals signs and nursing note reviewed.  Constitutional:      General: He is not in acute distress.    Appearance: He is well-developed. He is not diaphoretic.  Eyes:     General: No scleral icterus.    Conjunctiva/sclera: Conjunctivae normal.  Neck:     Musculoskeletal: Neck supple.     Thyroid: No thyromegaly.  Cardiovascular:     Rate and Rhythm: Normal rate and regular rhythm.     Heart sounds: Normal heart sounds. No murmur.  Pulmonary:     Effort: Pulmonary effort is normal. No respiratory distress.     Breath sounds: Normal breath sounds. No wheezing.  Lymphadenopathy:     Cervical: No  cervical adenopathy.  Skin:    General: Skin is warm and dry.     Findings: No rash.     Comments: Has a callus on his third toe on his right foot, no signs of infection currently, has a podiatrist  Neurological:     Mental Status: He is alert and oriented to person, place, and time.     Coordination: Coordination normal.  Psychiatric:        Behavior: Behavior normal.     Results for orders placed or performed in visit on 06/06/19  HM DIABETES EYE EXAM  Result Value Ref Range   HM Diabetic Eye Exam No Retinopathy No Retinopathy    Assessment & Plan:   Problem List Items Addressed This Visit      Cardiovascular and Mediastinum   Essential hypertension, benign   Relevant Medications   atorvastatin (LIPITOR) 20 MG tablet   Other Relevant Orders   CBC with Differential/Platelet   CMP14+EGFR   Lipid panel     Endocrine   Type 2 diabetes mellitus ([place]) - Primary   Relevant Medications   gabapentin (NEURONTIN) 600 MG tablet   atorvastatin (LIPITOR) 20 MG tablet   Other Relevant Orders   CBC with Differential/Platelet   CMP14+EGFR   Bayer DCA Hb A1c Waived     Other   Pain management contract signed   Relevant Medications   HYDROcodone-acetaminophen (NORCO) 10-325  MG tablet (Start on 09/19/2019)   HYDROcodone-acetaminophen (NORCO) 10-325 MG tablet (Start on 08/20/2019)   HYDROcodone-acetaminophen (NORCO) 10-325 MG tablet (Start on 07/28/2019)   Other Relevant Orders   ToxASSURE Select 13 (MW), Urine   S/P BKA (below knee amputation) unilateral, left (HCC)   Relevant Medications   HYDROcodone-acetaminophen (NORCO) 10-325 MG tablet (Start on 09/19/2019)   HYDROcodone-acetaminophen (NORCO) 10-325 MG tablet (Start on 08/20/2019)   HYDROcodone-acetaminophen (NORCO) 10-325 MG tablet (Start on 07/28/2019)   Other Relevant Orders   ToxASSURE Select 13 (MW), Urine    Other Visit Diagnoses    Chronic foot pain, unspecified laterality       Relevant Medications   HYDROcodone-acetaminophen (NORCO) 10-325 MG tablet (Start on 09/19/2019)   HYDROcodone-acetaminophen (NORCO) 10-325 MG tablet (Start on 08/20/2019)   HYDROcodone-acetaminophen (NORCO) 10-325 MG tablet (Start on 07/28/2019)   gabapentin (NEURONTIN) 600 MG tablet   Other Relevant Orders   ToxASSURE Select 13 (MW), Urine      Continue hydrocodone, his urine drug screen did not show it in the last time so we will repeat today.  Continue gabapentin and will start atorvastatin form.  Recommended to increase his Toujeo from 60 to 65 units daily, also focus on dietary modification Follow up plan: Return in about 3 months (around 10/21/2019), or if symptoms worsen or fail to improve, for Diabetes and hypertension and pain management.  Counseling provided for all of the vaccine components Orders Placed This Encounter  Procedures  . CBC with Differential/Platelet  . CMP14+EGFR  . Lipid panel  . Bayer [facility] Hb A1c [place], MD [facility] 07/22/2019, 1:18 PM

## 2019-07-23 LAB — CMP14+EGFR
ALT: 23 IU/L (ref 0–44)
AST: 15 IU/L (ref 0–40)
Albumin/Globulin Ratio: 1.8 (ref 1.2–2.2)
Albumin: 4.4 g/dL (ref 3.8–4.9)
Alkaline Phosphatase: 189 IU/L — ABNORMAL HIGH (ref 39–117)
BUN/Creatinine Ratio: 12 (ref 9–20)
BUN: 11 mg/dL (ref 6–24)
Bilirubin Total: 0.8 mg/dL (ref 0.0–1.2)
CO2: 24 mmol/L (ref 20–29)
Calcium: 9.1 mg/dL (ref 8.7–10.2)
Chloride: 98 mmol/L (ref 96–106)
Creatinine, Ser: 0.95 mg/dL (ref 0.76–1.27)
GFR calc Af Amer: 104 mL/min/{1.73_m2} (ref >59)
GFR calc non Af Amer: 90 mL/min/{1.73_m2} (ref >59)
Globulin, Total: 2.4 g/dL (ref 1.5–4.5)
Glucose: 248 mg/dL — ABNORMAL HIGH (ref 65–99)
Potassium: 4.4 mmol/L (ref 3.5–5.2)
Sodium: 139 mmol/L (ref 134–144)
Total Protein: 6.8 g/dL (ref 6.0–8.5)

## 2019-07-23 LAB — CBC WITH DIFFERENTIAL/PLATELET
Basophils Absolute: 0 10*3/uL (ref 0.0–0.2)
Basos: 0 %
EOS (ABSOLUTE): 0 10*3/uL (ref 0.0–0.4)
Eos: 0 %
Hematocrit: 48.3 % (ref 37.5–51.0)
Hemoglobin: 16.4 g/dL (ref 13.0–17.7)
Immature Grans (Abs): 0 10*3/uL (ref 0.0–0.1)
Immature Granulocytes: 0 %
Lymphocytes Absolute: 1.7 10*3/uL (ref 0.7–3.1)
Lymphs: 24 %
MCH: 30.4 pg (ref 26.6–33.0)
MCHC: 34 g/dL (ref 31.5–35.7)
MCV: 90 fL (ref 79–97)
Monocytes Absolute: 0.5 10*3/uL (ref 0.1–0.9)
Monocytes: 7 %
Neutrophils Absolute: 4.6 10*3/uL (ref 1.4–7.0)
Neutrophils: 69 %
Platelets: 222 10*3/uL (ref 150–450)
RBC: 5.39 x10E6/uL (ref 4.14–5.80)
RDW: 12.6 % (ref 11.6–15.4)
WBC: 6.8 10*3/uL (ref 3.4–10.8)

## 2019-07-23 LAB — LIPID PANEL
Chol/HDL Ratio: 4.1 ratio (ref 0.0–5.0)
Cholesterol, Total: 126 mg/dL (ref 100–199)
HDL: 31 mg/dL — ABNORMAL LOW (ref >39)
LDL Chol Calc (NIH): 63 mg/dL (ref 0–99)
Triglycerides: 189 mg/dL — ABNORMAL HIGH (ref 0–149)
VLDL Cholesterol Cal: 32 mg/dL (ref 5–40)

## 2019-07-25 LAB — TOXASSURE SELECT 13 (MW), URINE

## 2019-07-28 ENCOUNTER — Telehealth: Payer: Self-pay | Admitting: Family Medicine

## 2019-07-28 NOTE — Telephone Encounter (Signed)
Aware of results. 

## 2019-08-18 ENCOUNTER — Telehealth: Payer: Self-pay | Admitting: Family Medicine

## 2019-08-18 NOTE — Telephone Encounter (Signed)
Patient had questions about his medicare plan but has since figured out what he needed to know. I advised for him to contact us if he needed further assistance. Patient agreed and verbalized understanding

## 2019-09-01 ENCOUNTER — Ambulatory Visit: Payer: BC Managed Care – PPO

## 2019-09-05 ENCOUNTER — Ambulatory Visit: Payer: Medicare Other

## 2019-09-05 ENCOUNTER — Other Ambulatory Visit: Payer: Self-pay

## 2019-09-15 ENCOUNTER — Ambulatory Visit: Payer: Medicare Other

## 2019-10-06 ENCOUNTER — Ambulatory Visit (INDEPENDENT_AMBULATORY_CARE_PROVIDER_SITE_OTHER): Payer: Medicare Other

## 2019-10-06 ENCOUNTER — Other Ambulatory Visit: Payer: Self-pay

## 2019-10-06 DIAGNOSIS — Z23 Encounter for immunization: Secondary | ICD-10-CM

## 2019-10-06 NOTE — Progress Notes (Signed)
Shingrix vaccine to right deltoid.  Patient tolerated well. 

## 2019-10-10 ENCOUNTER — Other Ambulatory Visit: Payer: Self-pay | Admitting: Family Medicine

## 2019-10-13 ENCOUNTER — Telehealth: Payer: Self-pay | Admitting: Family Medicine

## 2019-10-13 DIAGNOSIS — E1140 Type 2 diabetes mellitus with diabetic neuropathy, unspecified: Secondary | ICD-10-CM

## 2019-10-13 NOTE — Telephone Encounter (Signed)
Have him try for prescription assistance through the county health department, given the number for that, if that does not work then he may have to go back to a normal meter

## 2019-10-14 ENCOUNTER — Telehealth: Payer: Self-pay | Admitting: Family Medicine

## 2019-10-14 MED ORDER — FREESTYLE LIBRE 14 DAY READER DEVI: 1.0000 | Freq: Every day | 0 refills | Status: DC

## 2019-10-14 MED ORDER — FREESTYLE LIBRE 14 DAY SENSOR MISC: 1.0000 | 3 refills | Status: DC

## 2019-10-14 NOTE — Telephone Encounter (Signed)
Duplicate call-see previous telephone encounter.

## 2019-10-14 NOTE — Telephone Encounter (Signed)
Spoke to pt and he said he spoke to [employer] and they told him [facility] doesn't work with Medicare and that [employer] [facility] does so he would like the rx sent to [place] to check on the price there. Rx sent to [facility] and pt was given number to health department.

## 2019-10-22 ENCOUNTER — Emergency Department (HOSPITAL_COMMUNITY): Payer: Medicare Other

## 2019-10-22 ENCOUNTER — Inpatient Hospital Stay (HOSPITAL_COMMUNITY)
Admission: EM | Admit: 2019-10-22 | Discharge: 2019-10-30 | DRG: 853 | Disposition: A | Discharge status: Home Health | Payer: Medicare Other | Attending: Internal Medicine | Admitting: Internal Medicine

## 2019-10-22 ENCOUNTER — Encounter (HOSPITAL_COMMUNITY): Payer: Self-pay | Admitting: Emergency Medicine

## 2019-10-22 ENCOUNTER — Other Ambulatory Visit: Payer: Self-pay

## 2019-10-22 DIAGNOSIS — E11610 Type 2 diabetes mellitus with diabetic neuropathic arthropathy: Secondary | ICD-10-CM | POA: Diagnosis present

## 2019-10-22 DIAGNOSIS — Z89421 Acquired absence of other right toe(s): Secondary | ICD-10-CM

## 2019-10-22 DIAGNOSIS — E119 Type 2 diabetes mellitus without complications: Secondary | ICD-10-CM

## 2019-10-22 DIAGNOSIS — Z20828 Contact with and (suspected) exposure to other viral communicable diseases: Secondary | ICD-10-CM | POA: Diagnosis present

## 2019-10-22 DIAGNOSIS — E876 Hypokalemia: Secondary | ICD-10-CM | POA: Diagnosis present

## 2019-10-22 DIAGNOSIS — N179 Acute kidney failure, unspecified: Secondary | ICD-10-CM

## 2019-10-22 DIAGNOSIS — L03115 Cellulitis of right lower limb: Secondary | ICD-10-CM | POA: Diagnosis not present

## 2019-10-22 DIAGNOSIS — R7989 Other specified abnormal findings of blood chemistry: Secondary | ICD-10-CM | POA: Diagnosis not present

## 2019-10-22 DIAGNOSIS — E1142 Type 2 diabetes mellitus with diabetic polyneuropathy: Secondary | ICD-10-CM | POA: Diagnosis present

## 2019-10-22 DIAGNOSIS — Z01810 Encounter for preprocedural cardiovascular examination: Secondary | ICD-10-CM | POA: Diagnosis not present

## 2019-10-22 DIAGNOSIS — M869 Osteomyelitis, unspecified: Secondary | ICD-10-CM | POA: Diagnosis present

## 2019-10-22 DIAGNOSIS — M14671 Charcot's joint, right ankle and foot: Secondary | ICD-10-CM | POA: Diagnosis not present

## 2019-10-22 DIAGNOSIS — L97519 Non-pressure chronic ulcer of other part of right foot with unspecified severity: Secondary | ICD-10-CM | POA: Diagnosis present

## 2019-10-22 DIAGNOSIS — E11621 Type 2 diabetes mellitus with foot ulcer: Secondary | ICD-10-CM | POA: Diagnosis present

## 2019-10-22 DIAGNOSIS — E1110 Type 2 diabetes mellitus with ketoacidosis without coma: Secondary | ICD-10-CM | POA: Diagnosis present

## 2019-10-22 DIAGNOSIS — E785 Hyperlipidemia, unspecified: Secondary | ICD-10-CM | POA: Diagnosis present

## 2019-10-22 DIAGNOSIS — E1111 Type 2 diabetes mellitus with ketoacidosis with coma: Secondary | ICD-10-CM | POA: Diagnosis not present

## 2019-10-22 DIAGNOSIS — Z7982 Long term (current) use of aspirin: Secondary | ICD-10-CM

## 2019-10-22 DIAGNOSIS — Z86718 Personal history of other venous thrombosis and embolism: Secondary | ICD-10-CM

## 2019-10-22 DIAGNOSIS — I444 Left anterior fascicular block: Secondary | ICD-10-CM | POA: Diagnosis present

## 2019-10-22 DIAGNOSIS — M609 Myositis, unspecified: Secondary | ICD-10-CM | POA: Diagnosis present

## 2019-10-22 DIAGNOSIS — R197 Diarrhea, unspecified: Secondary | ICD-10-CM | POA: Diagnosis present

## 2019-10-22 DIAGNOSIS — E11628 Type 2 diabetes mellitus with other skin complications: Secondary | ICD-10-CM | POA: Diagnosis present

## 2019-10-22 DIAGNOSIS — I4891 Unspecified atrial fibrillation: Secondary | ICD-10-CM

## 2019-10-22 DIAGNOSIS — Z794 Long term (current) use of insulin: Secondary | ICD-10-CM

## 2019-10-22 DIAGNOSIS — L089 Local infection of the skin and subcutaneous tissue, unspecified: Secondary | ICD-10-CM

## 2019-10-22 DIAGNOSIS — M14679 Charcot's joint, unspecified ankle and foot: Secondary | ICD-10-CM | POA: Diagnosis present

## 2019-10-22 DIAGNOSIS — A419 Sepsis, unspecified organism: Secondary | ICD-10-CM | POA: Diagnosis not present

## 2019-10-22 DIAGNOSIS — I82451 Acute embolism and thrombosis of right peroneal vein: Secondary | ICD-10-CM | POA: Diagnosis present

## 2019-10-22 DIAGNOSIS — Z89512 Acquired absence of left leg below knee: Secondary | ICD-10-CM | POA: Diagnosis not present

## 2019-10-22 DIAGNOSIS — E871 Hypo-osmolality and hyponatremia: Secondary | ICD-10-CM | POA: Diagnosis present

## 2019-10-22 DIAGNOSIS — I82431 Acute embolism and thrombosis of right popliteal vein: Secondary | ICD-10-CM | POA: Diagnosis present

## 2019-10-22 DIAGNOSIS — Z79899 Other long term (current) drug therapy: Secondary | ICD-10-CM

## 2019-10-22 DIAGNOSIS — B9561 Methicillin susceptible Staphylococcus aureus infection as the cause of diseases classified elsewhere: Secondary | ICD-10-CM

## 2019-10-22 DIAGNOSIS — R739 Hyperglycemia, unspecified: Secondary | ICD-10-CM

## 2019-10-22 DIAGNOSIS — E1165 Type 2 diabetes mellitus with hyperglycemia: Secondary | ICD-10-CM | POA: Diagnosis not present

## 2019-10-22 DIAGNOSIS — I152 Hypertension secondary to endocrine disorders: Secondary | ICD-10-CM | POA: Diagnosis present

## 2019-10-22 DIAGNOSIS — D72829 Elevated white blood cell count, unspecified: Secondary | ICD-10-CM | POA: Diagnosis present

## 2019-10-22 DIAGNOSIS — R112 Nausea with vomiting, unspecified: Secondary | ICD-10-CM | POA: Diagnosis not present

## 2019-10-22 DIAGNOSIS — E869 Volume depletion, unspecified: Secondary | ICD-10-CM | POA: Diagnosis present

## 2019-10-22 DIAGNOSIS — Z79891 Long term (current) use of opiate analgesic: Secondary | ICD-10-CM

## 2019-10-22 DIAGNOSIS — E1169 Type 2 diabetes mellitus with other specified complication: Secondary | ICD-10-CM | POA: Diagnosis present

## 2019-10-22 DIAGNOSIS — M79602 Pain in left arm: Secondary | ICD-10-CM

## 2019-10-22 DIAGNOSIS — I10 Essential (primary) hypertension: Secondary | ICD-10-CM | POA: Diagnosis present

## 2019-10-22 DIAGNOSIS — R7881 Bacteremia: Secondary | ICD-10-CM | POA: Diagnosis present

## 2019-10-22 DIAGNOSIS — A4101 Sepsis due to Methicillin susceptible Staphylococcus aureus: Secondary | ICD-10-CM | POA: Diagnosis present

## 2019-10-22 DIAGNOSIS — R6520 Severe sepsis without septic shock: Secondary | ICD-10-CM

## 2019-10-22 DIAGNOSIS — M79661 Pain in right lower leg: Secondary | ICD-10-CM

## 2019-10-22 DIAGNOSIS — Z9889 Other specified postprocedural states: Secondary | ICD-10-CM

## 2019-10-22 DIAGNOSIS — Z833 Family history of diabetes mellitus: Secondary | ICD-10-CM

## 2019-10-22 DIAGNOSIS — T148XXA Other injury of unspecified body region, initial encounter: Secondary | ICD-10-CM

## 2019-10-22 HISTORY — DX: Furuncle, unspecified: L02.92

## 2019-10-22 HISTORY — DX: Type 2 diabetes mellitus with diabetic neuropathy, unspecified: E11.40

## 2019-10-22 HISTORY — DX: Carbuncle, unspecified: L02.93

## 2019-10-22 LAB — URINALYSIS, ROUTINE W REFLEX MICROSCOPIC
Bacteria, UA: NONE SEEN
Bilirubin Urine: NEGATIVE
Glucose, UA: >=500 mg/dL — AB
Ketones, ur: 5 mg/dL — AB
Leukocytes,Ua: NEGATIVE
Nitrite: NEGATIVE
Protein, ur: NEGATIVE mg/dL
Specific Gravity, Urine: 1.017 (ref 1.005–1.030)
pH: 5 (ref 5.0–8.0)

## 2019-10-22 LAB — CBC WITH DIFFERENTIAL/PLATELET
Abs Immature Granulocytes: 0.16 10*3/uL — ABNORMAL HIGH (ref 0.00–0.07)
Basophils Absolute: 0.1 10*3/uL (ref 0.0–0.1)
Basophils Relative: 1 %
Eosinophils Absolute: 0 10*3/uL (ref 0.0–0.5)
Eosinophils Relative: 0 %
HCT: 46.1 % (ref 39.0–52.0)
Hemoglobin: 16 g/dL (ref 13.0–17.0)
Immature Granulocytes: 1 %
Lymphocytes Relative: 5 %
Lymphs Abs: 0.6 10*3/uL — ABNORMAL LOW (ref 0.7–4.0)
MCH: 30.5 pg (ref 26.0–34.0)
MCHC: 34.7 g/dL (ref 30.0–36.0)
MCV: 88 fL (ref 80.0–100.0)
Monocytes Absolute: 1.1 10*3/uL — ABNORMAL HIGH (ref 0.1–1.0)
Monocytes Relative: 9 %
Neutro Abs: 10.2 10*3/uL — ABNORMAL HIGH (ref 1.7–7.7)
Neutrophils Relative %: 84 %
Platelets: 176 10*3/uL (ref 150–400)
RBC: 5.24 MIL/uL (ref 4.22–5.81)
RDW: 11.8 % (ref 11.5–15.5)
WBC: 12.1 10*3/uL — ABNORMAL HIGH (ref 4.0–10.5)
nRBC: 0 % (ref 0.0–0.2)

## 2019-10-22 LAB — COMPREHENSIVE METABOLIC PANEL
ALT: 30 U/L (ref 0–44)
AST: 28 U/L (ref 15–41)
Albumin: 3.4 g/dL — ABNORMAL LOW (ref 3.5–5.0)
Alkaline Phosphatase: 123 U/L (ref 38–126)
Anion gap: 15 (ref 5–15)
BUN: 40 mg/dL — ABNORMAL HIGH (ref 6–20)
CO2: 21 mmol/L — ABNORMAL LOW (ref 22–32)
Calcium: 8.8 mg/dL — ABNORMAL LOW (ref 8.9–10.3)
Chloride: 89 mmol/L — ABNORMAL LOW (ref 98–111)
Creatinine, Ser: 1.42 mg/dL — ABNORMAL HIGH (ref 0.61–1.24)
GFR calc Af Amer: >60 mL/min (ref >60)
GFR calc non Af Amer: 55 mL/min — ABNORMAL LOW (ref >60)
Glucose, Bld: 389 mg/dL — ABNORMAL HIGH (ref 70–99)
Potassium: 3.4 mmol/L — ABNORMAL LOW (ref 3.5–5.1)
Sodium: 125 mmol/L — ABNORMAL LOW (ref 135–145)
Total Bilirubin: 2 mg/dL — ABNORMAL HIGH (ref 0.3–1.2)
Total Protein: 7.5 g/dL (ref 6.5–8.1)

## 2019-10-22 LAB — MAGNESIUM: Magnesium: 1.5 mg/dL — ABNORMAL LOW (ref 1.7–2.4)

## 2019-10-22 LAB — SEDIMENTATION RATE: Sed Rate: 43 mm/hr — ABNORMAL HIGH (ref 0–16)

## 2019-10-22 LAB — CREATININE, URINE, RANDOM: Creatinine, Urine: 126.82 mg/dL

## 2019-10-22 LAB — GLUCOSE, CAPILLARY: Glucose-Capillary: 278 mg/dL — ABNORMAL HIGH (ref 70–99)

## 2019-10-22 LAB — CBG MONITORING, ED: Glucose-Capillary: 395 mg/dL — ABNORMAL HIGH (ref 70–99)

## 2019-10-22 LAB — PROTIME-INR
INR: 1.3 — ABNORMAL HIGH (ref 0.8–1.2)
Prothrombin Time: 15.9 seconds — ABNORMAL HIGH (ref 11.4–15.2)

## 2019-10-22 LAB — LACTIC ACID, PLASMA
Lactic Acid, Venous: 3.8 mmol/L (ref 0.5–1.9)
Lactic Acid, Venous: 2.3 mmol/L (ref 0.5–1.9)

## 2019-10-22 LAB — SODIUM, URINE, RANDOM: Sodium, Ur: <10 mmol/L

## 2019-10-22 LAB — OSMOLALITY, URINE: Osmolality, Ur: 508 mOsm/kg (ref 300–900)

## 2019-10-22 LAB — APTT: aPTT: 30 seconds (ref 24–36)

## 2019-10-22 LAB — C-REACTIVE PROTEIN: CRP: 40.7 mg/dL — ABNORMAL HIGH (ref <1.0)

## 2019-10-22 MED ORDER — SODIUM CHLORIDE 0.9 % IV BOLUS
1000.0000 mL | Freq: Once | INTRAVENOUS | Status: AC
  Administered 2019-10-22: 1000 mL via INTRAVENOUS

## 2019-10-22 MED ORDER — INSULIN ASPART 100 UNIT/ML ~~LOC~~ SOLN
0.0000 [IU] | Freq: Every day | SUBCUTANEOUS | Status: DC
  Administered 2019-10-22 – 2019-10-23 (×2): 3 [IU] via SUBCUTANEOUS
  Administered 2019-10-24: 2 [IU] via SUBCUTANEOUS

## 2019-10-22 MED ORDER — DILTIAZEM HCL-DEXTROSE 125-5 MG/125ML-% IV SOLN (PREMIX)
5.0000 mg/h | INTRAVENOUS | Status: AC
  Administered 2019-10-22: 5 mg/h via INTRAVENOUS
  Administered 2019-10-23: 10 mg/h via INTRAVENOUS
  Administered 2019-10-23: 12.5 mg/h via INTRAVENOUS
  Administered 2019-10-24 – 2019-10-25 (×2): 10 mg/h via INTRAVENOUS
  Administered 2019-10-25: 5 mg/h via INTRAVENOUS
  Filled 2019-10-22 (×5): qty 125

## 2019-10-22 MED ORDER — FLUTICASONE PROPIONATE 50 MCG/ACT NA SUSP
1.0000 | Freq: Two times a day (BID) | NASAL | Status: DC | PRN
  Filled 2019-10-22: qty 16

## 2019-10-22 MED ORDER — SODIUM CHLORIDE 0.9% FLUSH
3.0000 mL | Freq: Two times a day (BID) | INTRAVENOUS | Status: DC
  Administered 2019-10-22 – 2019-10-30 (×13): 3 mL via INTRAVENOUS

## 2019-10-22 MED ORDER — ONDANSETRON HCL 4 MG/2ML IJ SOLN
4.0000 mg | Freq: Four times a day (QID) | INTRAMUSCULAR | Status: DC | PRN
  Administered 2019-10-22 – 2019-10-23 (×2): 4 mg via INTRAVENOUS
  Filled 2019-10-22 (×2): qty 2

## 2019-10-22 MED ORDER — DILTIAZEM LOAD VIA INFUSION
10.0000 mg | Freq: Once | INTRAVENOUS | Status: AC
  Administered 2019-10-22: 10 mg via INTRAVENOUS
  Filled 2019-10-22: qty 10

## 2019-10-22 MED ORDER — ACETAMINOPHEN 650 MG RE SUPP
650.0000 mg | Freq: Four times a day (QID) | RECTAL | Status: DC | PRN
  Administered 2019-10-24: 650 mg via RECTAL
  Filled 2019-10-22: qty 1

## 2019-10-22 MED ORDER — INSULIN GLARGINE 100 UNIT/ML ~~LOC~~ SOLN
20.0000 [IU] | Freq: Every day | SUBCUTANEOUS | Status: DC
  Administered 2019-10-22 – 2019-10-23 (×2): 20 [IU] via SUBCUTANEOUS
  Filled 2019-10-22 (×5): qty 0.2

## 2019-10-22 MED ORDER — FENTANYL CITRATE (PF) 100 MCG/2ML IJ SOLN
25.0000 ug | INTRAMUSCULAR | Status: DC | PRN
  Administered 2019-10-23 – 2019-10-24 (×2): 25 ug via INTRAVENOUS
  Administered 2019-10-24 (×2): 50 ug via INTRAVENOUS
  Administered 2019-10-25 – 2019-10-28 (×4): 25 ug via INTRAVENOUS
  Filled 2019-10-22 (×6): qty 2

## 2019-10-22 MED ORDER — VANCOMYCIN HCL 1250 MG/250ML IV SOLN
1250.0000 mg | Freq: Two times a day (BID) | INTRAVENOUS | Status: DC
  Administered 2019-10-23: 1250 mg via INTRAVENOUS

## 2019-10-22 MED ORDER — PIPERACILLIN-TAZOBACTAM 3.375 G IVPB 30 MIN
3.3750 g | Freq: Once | INTRAVENOUS | Status: AC
  Administered 2019-10-22: 3.375 g via INTRAVENOUS
  Filled 2019-10-22: qty 50

## 2019-10-22 MED ORDER — POTASSIUM CHLORIDE 10 MEQ/100ML IV SOLN
10.0000 meq | INTRAVENOUS | Status: AC
  Administered 2019-10-22 (×2): 10 meq via INTRAVENOUS
  Filled 2019-10-22 (×2): qty 100

## 2019-10-22 MED ORDER — PIPERACILLIN-TAZOBACTAM 3.375 G IVPB
3.3750 g | Freq: Three times a day (TID) | INTRAVENOUS | Status: DC
  Administered 2019-10-22 – 2019-10-23 (×2): 3.375 g via INTRAVENOUS
  Filled 2019-10-22 (×2): qty 50

## 2019-10-22 MED ORDER — ACETAMINOPHEN 500 MG PO TABS
1000.0000 mg | ORAL_TABLET | Freq: Once | ORAL | Status: AC
  Administered 2019-10-22: 1000 mg via ORAL
  Filled 2019-10-22: qty 2

## 2019-10-22 MED ORDER — ONDANSETRON HCL 4 MG PO TABS: 4.0000 mg | ORAL_TABLET | Freq: Four times a day (QID) | ORAL | Status: DC | PRN

## 2019-10-22 MED ORDER — SODIUM CHLORIDE 0.9 % IV SOLN: INTRAVENOUS | Status: DC

## 2019-10-22 MED ORDER — VANCOMYCIN HCL IN DEXTROSE 1-5 GM/200ML-% IV SOLN
1000.0000 mg | INTRAVENOUS | Status: AC
  Administered 2019-10-22 (×2): 1000 mg via INTRAVENOUS
  Filled 2019-10-22 (×2): qty 200

## 2019-10-22 MED ORDER — CHLORHEXIDINE GLUCONATE CLOTH 2 % EX PADS
6.0000 | MEDICATED_PAD | Freq: Every day | CUTANEOUS | Status: DC
  Administered 2019-10-23 – 2019-10-29 (×6): 6 via TOPICAL

## 2019-10-22 MED ORDER — ACETAMINOPHEN 325 MG PO TABS
650.0000 mg | ORAL_TABLET | Freq: Four times a day (QID) | ORAL | Status: DC | PRN
  Administered 2019-10-23 – 2019-10-27 (×3): 650 mg via ORAL
  Filled 2019-10-22 (×3): qty 2

## 2019-10-22 MED ORDER — INSULIN ASPART 100 UNIT/ML ~~LOC~~ SOLN
0.0000 [IU] | Freq: Three times a day (TID) | SUBCUTANEOUS | Status: DC
  Administered 2019-10-23 (×2): 5 [IU] via SUBCUTANEOUS
  Administered 2019-10-23: 8 [IU] via SUBCUTANEOUS
  Administered 2019-10-24 (×2): 5 [IU] via SUBCUTANEOUS
  Administered 2019-10-25: 3 [IU] via SUBCUTANEOUS
  Administered 2019-10-25: 5 [IU] via SUBCUTANEOUS
  Administered 2019-10-25 – 2019-10-26 (×4): 3 [IU] via SUBCUTANEOUS
  Administered 2019-10-27: 2 [IU] via SUBCUTANEOUS
  Administered 2019-10-27 – 2019-10-28 (×4): 3 [IU] via SUBCUTANEOUS
  Administered 2019-10-29: 2 [IU] via SUBCUTANEOUS
  Administered 2019-10-29: 3 [IU] via SUBCUTANEOUS
  Administered 2019-10-30: 5 [IU] via SUBCUTANEOUS
  Administered 2019-10-30: 3 [IU] via SUBCUTANEOUS

## 2019-10-22 NOTE — ED Notes (Signed)
Pt was informed that we need a urine sample. 

## 2019-10-22 NOTE — Sepsis Progress Note (Signed)
Notified bedside nurse of need to draw repeat lactic acid after fluid resusitation is complete.. 

## 2019-10-22 NOTE — ED Triage Notes (Signed)
Patient is diabetic, has fever, loose stools, elevated BS 395, N/V, since Monday.

## 2019-10-22 NOTE — ED Notes (Signed)
Date and time results received: 10/22/19 16:30 (use smartphrase ".now" to insert current time)  Test: lactic acid Critical Value: 3.8  Name of Provider Notified: Aldona Bar PA   Orders Received? Or Actions Taken?:

## 2019-10-22 NOTE — ED Provider Notes (Signed)
This 53 year old male known diabetic previous amputee on the left presents with increasing redness swelling and warmth to his right lower extremity around the foot and ankle.  He has a known Charcot foot, the redness is streaking up the leg, he is febrile tachycardic and ill-appearing.  He will need to be treated with IV fluids antibiotics and admission to the hospital.  He will need imaging to search for signs of deep tissue infection which may require more urgent surgery.  His podiatrist is aware of his being in the hospital.  We will engage them but the patient will need to be admitted for sepsis.  He does not appear to be in shock based on his blood pressure, lactic acid pending.  The patient is critically ill.  .Critical Care Performed by: Noemi Chapel, MD Authorized by: Noemi Chapel, MD   Critical care provider statement:    Critical care time (minutes):  35   Critical care time was exclusive of:  Separately billable procedures and treating other patients and teaching time   Critical care was necessary to treat or prevent imminent or life-threatening deterioration of the following conditions:  Sepsis   Critical care was time spent personally by me on the following activities:  Blood draw for specimens, development of treatment plan with patient or surrogate, discussions with consultants, evaluation of patient's response to treatment, examination of patient, obtaining history from patient or surrogate, ordering and performing treatments and interventions, ordering and review of laboratory studies, ordering and review of radiographic studies, pulse oximetry, re-evaluation of patient's condition and review of old charts   Medical screening examination/treatment/procedure(s) were conducted as a shared visit with non-physician practitioner(s) and myself.  I personally evaluated the patient during the encounter.  Clinical Impression:   Final diagnoses:  Sepsis, due to unspecified organism,  unspecified whether acute organ dysfunction present ([place])  Wound infection  Hyperglycemia  AKI (acute kidney injury) ([place])  Atrial fibrillation with rapid ventricular response (HCC)         Noemi Chapel, MD 10/25/19 [phone]

## 2019-10-22 NOTE — Progress Notes (Signed)
Pharmacy Antibiotic Note  [name] is a 53 y.o. male admitted on 10/22/2019 with wound infection.  Pharmacy has been consulted for zosyn and vancomycin dosing.  Plan: Vancomycin 1250mg  IV every 12 hours.  Goal trough 15-20 mcg/mL. Zosyn 3.375g IV q8h (4 hour infusion).  Height: 6\' 4"  (193 cm) Weight: 215 lb (97.5 kg) IBW/kg (Calculated) : 86.8  Temp (24hrs), Avg:99.4 F (37.4 C), Min:98.2 F (36.8 C), Max:100.6 F (38.1 C)  Recent Labs  Lab 10/22/19 1520  WBC 12.1*  CREATININE 1.42*    Estimated Creatinine Clearance: 72.2 mL/min (A) (by C-G formula based on SCr of 1.42 mg/dL (H)).    No Known Allergies  Antimicrobials this admission: 12/16 vancomycin >>  12/16 zosyn >>    Microbiology results: 12/16 BCx: sent 12/16 UCx: sent  12/16 Cdiff: Sent  12/16 Covid 19: sent   Thank you for allowing pharmacy to be a part of this patient's care.  Donna Christen Tahjai Schetter 10/22/2019 4:24 PM

## 2019-10-22 NOTE — ED Provider Notes (Addendum)
[facility] EMERGENCY DEPARTMENT Provider Note   CSN: [id number] Arrival date & time: 10/22/19  1345     History Chief Complaint  Patient presents with  . Weakness  . Diabetic Wound Ankle, Sole  . Diarrhea    [name] is a 53 y.o. 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  Patient states that he feels poorly, he feels generally weak, he states he is concerned that his right foot/ankle are infected as he has had increased redness/swelling to this area with a known wound to the 4th toe.  He has been on doxycycline 100 mg twice daily since 10/10/2019 for his foot wound-abx was prescribed by his podiatrist Dr. [name], he called the office today secondary to the increased redness/swelling and was told to come to the emergency department today.  He also has had nausea, vomiting, and diarrhea with estimated 5-6 episodes of emesis and 15-20 episodes of diarrhea.  No blood in emesis or diarrhea.  He was unaware of fever until arrival in the emergency department.  Denies URI symptoms, chest pain, dyspnea, coughing, palpitations, abdominal pain, melena, hematochezia, recent trauma to the right lower extremity.  He has his baseline neuropathy to the lower extremities.  HPI     Past Medical History:  Diagnosis Date  . Boil    numerous boils in past  . Charcot ankle   . Diabetes mellitus    diagnosed age 55  . Hypertension   . Neuropathy associated with endocrine disorder ([place])   . Osteomyelitis of toe of right foot [facility])     Patient Active Problem List   Diagnosis Date Noted  . Lumbar spine scoliosis 08/13/2018  . S/P BKA (below knee amputation) unilateral, left ([place])  01/12/2018  . Pain management contract signed 05/04/2017  . Peripheral edema 10/02/2016  . Polycythemia 12/09/2015  . Dizziness 12/09/2015  . Type 2 diabetes mellitus ([place]) 07/22/2015  . Neuropathy in diabetes ([place]) 03/31/2014  . Charcot ankle 03/09/2014  . Essential hypertension, benign 03/03/2013  . DDD (degenerative disc disease), lumbar 03/03/2013    Past Surgical History:  Procedure Laterality Date  . AMPUTATION Right 06/17/2014   Procedure: PARTIAL AMPUTATION RIGHT 3RD TOE;  Surgeon: Marcheta Grammes, DPM;  Location: [facility];  Service: Podiatry;  Laterality: Right;  . AMPUTATION Right 09/02/2014   Procedure: PARTIAL AMPUTATION 2ND TOE RIGHT FOOT;  Surgeon: Marcheta Grammes, DPM;  Location: [facility];  Service: Podiatry;  Laterality: Right;  . AMPUTATION Right 04/01/2015   Procedure: AMPUTATION DIGIT 1ST TOE RIGHT FOOT;  Surgeon: Juanita Laster, DPM;  Location: [facility];  Service: Podiatry;  Laterality: Right;  . AMPUTATION Right 06/29/2016   pinkeye   . FOOT AMPUTATION Left   . FOOT SURGERY Left 03/2017  . HERNIA REPAIR Bilateral    and 3rd hernia repair does not remember ehat side       Family History  Problem Relation Age of Onset  . Stroke Father   . Asthma Father   . COPD Father   . Aneurysm Father        AAA  . Diabetes Brother   . Stroke Brother        2008  . Diabetes Mother     Social History  Tobacco Use  . Smoking status: Never Smoker  . Smokeless tobacco: Never Used  Substance Use Topics  . Alcohol use: No  . Drug use: No    Home Medications Prior to Admission medications   Medication Sig Start Date End Date Taking? Authorizing Provider  albuterol (PROVENTIL HFA;VENTOLIN HFA) 108 (90 Base) MCG/ACT inhaler Inhale 2 puffs into the lungs every 6 (six) hours as needed for wheezing or shortness of breath. 01/08/19   Dettinger, Elige Radon, MD  aspirin (ASPIRIN EC) 81 MG EC tablet Take 81 mg by mouth daily. Swallow whole.    [provider]    atorvastatin (LIPITOR) 20 MG tablet Take 1 tablet (20 mg total) by mouth daily. 07/22/19   Dettinger, Elige Radon, MD  Continuous Blood Gluc Receiver (FREESTYLE LIBRE 14 DAY READER) DEVI 1 each by Does not apply route daily. 10/14/19   Dettinger, Elige Radon, MD  Continuous Blood Gluc Sensor (FREESTYLE LIBRE 14 DAY SENSOR) MISC 1 each by Other route every 14 (fourteen) days. 10/14/19   Dettinger, Elige Radon, MD  cyclobenzaprine (FLEXERIL) 10 MG tablet Take 1 tablet (10 mg total) by mouth at bedtime as needed for muscle spasms. 12/18/18   Dettinger, Elige Radon, MD  DULoxetine (CYMBALTA) 60 MG capsule Take 1 capsule (60 mg total) by mouth daily. 12/18/18   Dettinger, Elige Radon, MD  fluticasone (FLONASE) 50 MCG/ACT nasal spray Place 1 spray into both nostrils 2 (two) times daily as needed for allergies or rhinitis. 01/08/19   Dettinger, Elige Radon, MD  gabapentin (NEURONTIN) 600 MG tablet Take 1 tablet (600 mg total) by mouth at bedtime. 07/22/19   Dettinger, Elige Radon, MD  HYDROcodone-acetaminophen (NORCO) 10-325 MG tablet Take 1 tablet by mouth 2 (two) times daily as needed. Do not refill until 60 days from prescription date 09/19/19   Dettinger, Elige Radon, MD  HYDROcodone-acetaminophen [facility]) 10-325 MG tablet Take 1 tablet by mouth 2 (two) times daily as needed. Do not refill until 30 days from prescription date 08/20/19   Dettinger, Elige Radon, MD  HYDROcodone-acetaminophen (NORCO) 10-325 MG tablet Take 1 tablet by mouth 2 (two) times daily as needed for moderate pain. 07/28/19   Dettinger, Elige Radon, MD  ibuprofen (ADVIL,MOTRIN) 200 MG tablet Take 200 mg by mouth every 6 (six) hours as needed.    [provider]  Insulin Glargine, 2 Unit Dial, (TOUJEO MAX SOLOSTAR) 300 UNIT/ML SOPN Inject 40-50 Units into the skin daily. 04/18/19   Dettinger, Elige Radon, MD  Insulin Pen Needle (B-D UF III MINI PEN NEEDLES) 31G X 5 MM MISC 1 each by Does not apply route 4 (four) times daily. 11/18/18   Dettinger, Elige Radon, MD  lisinopril  (PRINIVIL,ZESTRIL) 5 MG tablet Take 1 tablet (5 mg total) by mouth daily. 11/18/18   Dettinger, Elige Radon, MD  metFORMIN (GLUCOPHAGE) 1000 MG tablet Take 1 tablet (1,000 mg total) by mouth 2 (two) times daily. 11/18/18   Dettinger, Elige Radon, MD  Multiple Vitamins-Minerals (MULTIVITAMIN WITH MINERALS) tablet Take 1 tablet by mouth daily.    [provider]  terbinafine (LAMISIL AT) 1 % cream Apply 1 application topically 2 (two) times daily. Patient not taking: Reported on 07/22/2019 07/17/19   Dettinger, Elige Radon, MD  zinc gluconate 50 MG tablet Take 50 mg by mouth daily.    [provider]    Allergies    Patient has no known allergies.  Review of Systems   Review of Systems  Constitutional: Positive for fatigue.  HENT: Negative for congestion, ear pain and sore throat.   Respiratory: Negative for shortness of breath.   Cardiovascular: Negative for chest pain.  Gastrointestinal: Positive for diarrhea, nausea and vomiting. Negative for abdominal pain, anal bleeding, blood in stool and constipation.  Genitourinary: Negative for dysuria.  Musculoskeletal: Positive for joint swelling.  Skin: Positive for color change.  Neurological: Positive for weakness.  All other systems reviewed and are negative.   Physical Exam Updated Vital Signs BP 128/85 (BP Location: Right Arm)   Pulse (!) 135   Temp (!) 100.6 F (38.1 C) (Oral)   Resp 20   Ht  (1.93 m)   Wt 97.5 kg   SpO2 97%   BMI 26.17 kg/m   Physical Exam Vitals and nursing note reviewed.  Constitutional:      General: He is not in acute distress.    Appearance: He is well-developed. He is ill-appearing. He is not toxic-appearing.  HENT:     Head: Normocephalic and atraumatic.  Eyes:     General:        Right eye: No discharge.        Left eye: No discharge.     Conjunctiva/sclera: Conjunctivae normal.  Cardiovascular:     Rate and Rhythm: Tachycardia present. Rhythm irregular.     Pulses:           Dorsalis pedis pulses are 2+ on the right side.  Pulmonary:     Effort: Pulmonary effort is normal. No respiratory distress.     Breath sounds: Normal breath sounds. No wheezing, rhonchi or rales.  Abdominal:     General: There is no distension.     Palpations: Abdomen is soft.     Tenderness: There is no abdominal tenderness. There is no guarding or rebound.  Musculoskeletal:     Cervical back: Neck supple. No rigidity.     Comments: Right lower extremity: Patient has an ulceration type wound to the plantar aspect of the 3rd toe with somewhat purulent drainage to the area.  He has some maceration between the 2nd & 3rd digits.  1st and 5th digits are surgically absent.  He also has a callus to the 4th toe.  He has erythema from the 3rd toe extending to the dorsum of the foot as well as to the diffuse ankle which is also streaking up the lower portion of the leg.  The area is hot to touch.  He also has some edema to the foot and the ankle.  Findings consistent with Charcot as well.  Decreased sensation consistent with his neuropathy.  No significant bony tenderness to palpation. Left lower extremity: Status post BKA with prosthetic in place.  Skin:    General: Skin is warm and dry.     Capillary Refill: Capillary refill takes less than 2 seconds.     Findings: No rash.  Neurological:     Comments: Alert. Clear speech.   Psychiatric:        Mood and Affect: Mood normal.        Behavior: Behavior normal.           ED Results / Procedures / Treatments   Labs (all labs ordered are listed, but only abnormal results are displayed) Labs Reviewed  COMPREHENSIVE METABOLIC PANEL - Abnormal; Notable for the following components:      Result Value   Sodium 125 (*)    Potassium 3.4 (*)    Chloride 89 (*)    CO2 21 (*)  Glucose, Bld 389 (*)    BUN 40 (*)    Creatinine, Ser 1.42 (*)    Calcium 8.8 (*)    Albumin 3.4 (*)    Total Bilirubin 2.0 (*)    GFR calc non Af Amer 55 (*)    All  other components within normal limits  CBC WITH DIFFERENTIAL/PLATELET - Abnormal; Notable for the following components:   WBC 12.1 (*)    All other components within normal limits  PROTIME-INR - Abnormal; Notable for the following components:   Prothrombin Time 15.9 (*)    INR 1.3 (*)    All other components within normal limits  CBG MONITORING, ED - Abnormal; Notable for the following components:   Glucose-Capillary 395 (*)    All other components within normal limits  CULTURE, BLOOD (ROUTINE X 2)  CULTURE, BLOOD (ROUTINE X 2)  URINE CULTURE  C DIFFICILE QUICK SCREEN W PCR REFLEX  SARS CORONAVIRUS 2 (TAT 6-24 HRS)  APTT  LACTIC ACID, PLASMA  LACTIC ACID, PLASMA  URINALYSIS, ROUTINE W REFLEX MICROSCOPIC  CBG MONITORING, ED    EKG EKG Interpretation  Date/Time:  Wednesday October 22 2019 15:34:15 EST Ventricular Rate:  182 PR Interval:    QRS Duration: 97 QT Interval:  261 QTC Calculation: 455 R Axis:   -47 Text Interpretation: Atrial fibrillation with rapid V-rate Left anterior fascicular block Consider anterior infarct ST depression, probably rate related Confirmed by Bethann Berkshire [phone]) on 10/22/2019 3:40:53 PM   Radiology DG Tibia/Fibula Right  Result Date: 10/22/2019 CLINICAL DATA:  Right leg pain and tenderness. Fever. Clinical evidence of infection. Diabetes. EXAM: RIGHT TIBIA AND FIBULA - 2 VIEW COMPARISON:  Right ankle radiographs obtained at the same time. FINDINGS: Mild diffuse soft tissue swelling. Previously described chronic appearing periosteal reaction involving the distal tibia. On these radiographs, this is involving the medial and posterior aspects of the tibia. Also again noted are previously described bone collapse and fragmentation involving the talotibial joint and flattening of the normal plantar arch. No soft tissue gas is seen. Mild medial and lateral spur formation at the knee joint with mild to moderate medial joint space narrowing. IMPRESSION:  1. Chronic appearing periosteal reaction involving the posterior and medial aspects of the distal tibia. Underlying chronic osteomyelitis is a possibility. 2. Diffuse soft tissue swelling without gas or bone destruction. 3. 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 Electronically Signed   By: Beckie Salts M.D.   On: 10/22/2019 16:05   DG Ankle Complete Right  Result Date: 10/22/2019 CLINICAL DATA:  Right leg pain and tenderness and fever with clinical evidence of infection. Diabetes. EXAM: RIGHT ANKLE - COMPLETE 3+ VIEW COMPARISON:  Right foot radiographs obtained today and on 06/19/2016. FINDINGS: Diffuse soft tissue swelling. Bone collapse and fragmentation at the talotibial joint, described on the foot radiographs, in addition to previously described flattening of the normal plantar arch. Again demonstrated are is chronic appearing posterior periosteal reaction involving the distal tibia. No bone destruction or soft tissue gas seen. IMPRESSION: 1. Diffuse soft tissue swelling without underlying changes of acute osteomyelitis. 2. Neuropathic changes at the talotibial joint. 3. Pes planus. Electronically Signed   By: Beckie Salts M.D.   On: 10/22/2019 16:02   DG Chest Port 1 View  Result Date: 10/22/2019 CLINICAL DATA:  Fever EXAM: PORTABLE CHEST 1 VIEW COMPARISON:  12/09/2013 chest radiograph. FINDINGS: Stable cardiomediastinal silhouette with top-normal heart size. No pneumothorax. No pleural effusion. Lungs appear clear, with no acute  consolidative airspace disease and no pulmonary edema. IMPRESSION: No active disease. Electronically Signed   By: Delbert Phenix M.D.   On: 10/22/2019 15:55   DG Foot Complete Right  Result Date: 10/22/2019 CLINICAL DATA:  Right foot pain and fever. Diabetes. Clinical infection. EXAM: RIGHT FOOT COMPLETE - 3+ VIEW COMPARISON:  06/19/2016. FINDINGS: Diffuse soft tissue swelling. Again demonstrated surgical absence of the right great toe with interval  surgical absence of the right little toe. Well-defined bone margins. No periosteal reaction or bone destruction involving the bones of the foot. There is flattening of the normal plantar arch and interval partial bone collapse and fragmentation involving the distal tibia and talus. There is also chronic appearing periosteal thickening and irregularity involving the distal tibia posteriorly. IMPRESSION: 1. Diffuse soft tissue swelling without soft tissue gas or evidence of underlying osteomyelitis. 2. Pes planus. 3. 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 Electronically Signed   By: Beckie Salts M.D.   On: 10/22/2019 15:59    Procedures .Critical Care Performed by: Cherly Anderson, PA-C Authorized by: Cherly Anderson, PA-C      CRITICAL CARE Performed by: Harvie Heck   Total critical care time: 45 minutes  Critical care time was exclusive of separately billable procedures and treating other patients.  Critical care was necessary to treat or prevent imminent or life-threatening deterioration.  Critical care was time spent personally by me on the following activities: development of treatment plan with patient and/or surrogate as well as nursing, discussions with consultants, evaluation of patient's response to treatment, examination of patient, obtaining history from patient or surrogate, ordering and performing treatments and interventions, ordering and review of laboratory studies, ordering and review of radiographic studies, pulse oximetry and re-evaluation of patient's condition.   (including critical care time)  Medications Ordered in ED Medications  piperacillin-tazobactam (ZOSYN) IVPB 3.375 g (3.375 g Intravenous New Bag/Given 10/22/19 1550)  vancomycin (VANCOCIN) IVPB 1000 mg/200 mL premix (has no administration in time range)  diltiazem (CARDIZEM) 1 mg/mL load via infusion 10 mg (10 mg Intravenous Bolus from Bag  10/22/19 1559)    And  diltiazem (CARDIZEM) 125 mg in dextrose 5% 125 mL (1 mg/mL) infusion (5 mg/hr Intravenous New Bag/Given 10/22/19 1559)  acetaminophen (TYLENOL) tablet 1,000 mg (1,000 mg Oral Given 10/22/19 1522)  sodium chloride 0.9 % bolus 1,000 mL (1,000 mLs Intravenous New Bag/Given 10/22/19 1532)  sodium chloride 0.9 % bolus 1,000 mL (1,000 mLs Intravenous New Bag/Given 10/22/19 1600)     ED Course/MDM:  I have reviewed the triage vital signs and the nursing notes.  Pertinent labs & imaging results that were available during my care of the patient were reviewed by me and considered in my medical decision making (see chart for details).  Patient presents to the emergency department with multiple complaints including generalized weakness, right lower extremity redness/swelling, and N/V/D.  On arrival patient noted to be febrile with oral temperature of 100.6 with tachycardia, he is ill appearing, code sepsis initiated, will start fluids, Zosyn and vancomycin started-concern for source of right lower extremity.  Plan to discuss with his podiatrist.  EKG: A. fib with RVR, rate ranging 130s-180s.  Patient does not have history of A. fib, start Cardizem bolus and drip per discussion with Dr. Estell Harpin. Additional fluids ordered to administer 30 cc/kg bolus  CBC: Leukocytosis @ 12.1 with left shift. No anemia.  CMP: Hyperglycemia, bicarb 21, anion gap is not elevated. Several electrolyte abnormalities as above,  none particularly critical currently, receiving fluids. AKI with creatinine 1.42 BUN 40 most recent 0.95/11 respectively.  PT/INR/APTT: mild PT/INR elevation, APTT WNL.  Lactic acid: Elevated @ 3.8.  UA: Pending C. Diff: Pending- obtained secondary to diarrhea on abx, lower suspicion for this.  Abdomen nontender w/o peritoneal signs.  CXR: negative for infiltrate.  RLE xrays: No obvious acute osteomyelitis, additional findings as above.   16:11: CONSULT: Discussed with podiatrist  Dr. Nolen Mu- in agreement with admission & IV abx, requesting R foot MRI w/wo contrast at some point throughout admission to further assess, will see patient tomorrow in consultation, at this time likely plan for OR this Friday pending stability. Appreciate consultation.   Concern for sepsis with R foot infection as source, AKI, lactic acid 3.8, also in new onset Afib w/ RVR which is improving some in terms of rate since initiation of fluids/cardizem- will consult hospitalist service for admission.   16:56: CONSULT: Discussed with hospitalist Dr. Arlean Hopping- accepts admission.   Findings and plan of care discussed with supervising physicians Dr. Hyacinth Meeker & Dr. Estell Harpin @ change of shift, each of which have evaluated patient & provided guidance in care, in agreement.   [name] was evaluated in Emergency Department on 10/22/2019 for the symptoms described in the history of present illness. He/she was evaluated in the context of the global COVID-19 pandemic, which necessitated consideration that the patient might be at risk for infection with the SARS-CoV-2 virus that causes COVID-19. Institutional protocols and algorithms that pertain to the evaluation of patients at risk for COVID-19 are in a state of rapid change based on information released by regulatory bodies including the CDC and federal and state organizations. These policies and algorithms were followed during the patient's care in the ED.  Final Clinical Impression(s) / ED Diagnoses Final diagnoses:  Sepsis, due to unspecified organism, unspecified whether acute organ dysfunction present (HCC)  Wound infection  Hyperglycemia  AKI (acute kidney injury) (HCC)  Atrial fibrillation with rapid ventricular response [facility])    Rx / DC Orders ED Discharge Orders    None       Cherly Anderson, PA-C 10/22/19 1713  ADDENDUM- clarification of fluids ordered.     Cherly Anderson, PA-C 10/22/19 [address] 10/24/19 1610    Eber Hong, MD 10/25/19 [phone]

## 2019-10-22 NOTE — H&P (Signed)
History and Physical    PLEASE NOTE THAT DRAGON DICTATION SOFTWARE WAS USED IN THE CONSTRUCTION OF THIS NOTE.   [name] ZJI:[id number] DOB: [birth year] DOA: 10/22/2019  PCP: Dettinger, [name], MD Patient coming from: home  I have personally briefly reviewed patient's old medical records in [place]  Chief Complaint: right foot redness  HPI: [name] is a 53 y.o. 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   The following history is obtained via my discussions with the patient, my discussions with the emergency department provider, as well as via chart review.  In the context of a history of recurrent diabetic foot ulcers, the patient has been following with Dr. [name] as his outpatient podiatrist.  In the setting of a new foot ulcer associated with the plantar aspect of the distal right fourth toe, the patient reports that Dr. [name] started the patient on 14-day course of doxycycline 100 mg p.o. twice daily on 10/10/2019 to prevent "development of infection".  However, in spite of outstanding compliance with this doxycycline, the patient reports subsequent worsening of erythema associated with the plantar aspect of the right fourth toe, with progression of the distribution of associated erythema to include proximal extension of erythema over the plantar aspect of the right foot as well as progression of erythema to include the  dorsal aspect of the right left foot, extending to the level of the right ankle.  Concomitant with the progression of distribution of erythema, the patient reports associated increase in swelling in a similar distribution, and notes that this area has also developed the quality of increased warmth to the touch.  He also notes interval development of purulent discharge from the ulcer over the plantar aspect of the right fourth toe.  He denies any significant discomfort associated with the right lower extremity, but qualifies this by conveying his history of diminished sensation of the bilateral lower extremities in the setting of diabetic polyperipheral neuropathy.  Denies any recent trauma or calf tenderness.   He denies any associated subjective fever, chills, rigors, or generalized myalgias. Denies any recent headache, neck stiffness, rhinitis, rhinorrhea, sore throat, sob, cough, No recent traveling or known COVID-19 exposures.  He also denies any dysuria or gross hematuria.  Over the last 3 to 4 days, the patient also reports development of nausea resulting in 4-5 episodes of nonbloody, nonbilious emesis, the most recent of which occurred just prior to presenting to the emergency department today.  Over the last 3 to 4 days, the patient also reports development of loose stool, reporting 3-4 episodes per day of loose stool over that time.  He denies any associated melena, hematochezia, or abdominal discomfort.  He denies any nausea, vomiting, or diarrhea prior to initiation of doxycycline on 10/10/2019.   Notable past medical history includes a history of type 2 diabetes mellitus, for which the patient reports that he is on Lantus 40-50 units [place] QAM, although he acknowledges that he missed his dose of basal insulin this morning in the setting of not  feeling well, as described above.  As such, he reports that his most recent dose of basal insulin occurred on the morning of 10/21/2019.  Reports that he does  not take any short acting insulin.  The patient denies any known history of atrial fibrillation.  He denies any recent chest pain, palpitations, diaphoresis, or shortness of breath.  He is on a daily baby aspirin, but otherwise denies taking any blood thinning agents at home.  In terms of his history of recurrent diabetic foot ulcers associated with prior episodes of osteomyelitis, the patient has previously undergone left BKA, partial amputation of the right third toe in August 2015, partial amputation of the right second toe in October 2015, and amputation of the right first toe in May 2016.     ED Course: Vital signs in the emergency department were notable for the following: Initial temperature 100.6, which was also the temperature max during ED course, which improved to 98.2 following 1 dose of acetaminophen, as further described below; initial heart rate in the 170s, which improved into the range of 110's - 120's bpm following initiation of diltiazem drip; initial blood pressure 128/85, with final blood pressure in the ED noted to be 112/75 following initiation of diltiazem drip, as further described below; respiratory rate 20-26, and oxygen saturation 96 to 90% on room air.  Labs performed in the ED were notable for the following: CMP notable for sodium 125, potassium 3.4, chloride 89, bicarbonate 21, anion gap 15, BUN 40, creatinine 1.42 relative denies recent prior creatinine data point of 0.95 when checked on 07/22/2019, BUN to creatinine ratio 28, glucose 389, alkaline phosphatase 123, AST 20, ALT 30, total bilirubin 2.0; CBC notable for white blood cell count of 12,000 with 84% neutrophils.  Lactic acid 3.8; blood cultures x2 were collected prior to initiation of any antibiotics; in anticipation of admission, nasopharyngeal COVID-19 PCR was obtained, with results currently pending.  Additionally, in the setting of recent diarrhea following antibiotic use, C. difficile was ordered, with sample  still to be collected.  Urinalysis was ordered, with results pending at this time.  Chest x-ray showed no evidence of acute cardiopulmonary findings, including no evidence of infiltrate, edema, effusion, or pneumothorax.  Plain films of the right foot/ankle showed diffuse soft tissue swelling without evidence of soft tissue gas or evidence of osteomyelitis.  Plain films of the right tibia-fibula showed chronic appearing periosteal reaction involving the posterior and medial aspects of the distal tibia, in the absence of any associated evidence of gas or bony destruction. EKG showed atrial fibrillation with RVR and ventricular rate 182, left anterior fascicular block, nonspecific T wave inversion in aVL, and nonspecific less than 1 mm ST depression in V6.  While still in the ED, the following were administered: Tylenol 1 g p.o. x1; diltiazem 10 mg IV x1 followed by initiation of diltiazem infusion.  IV vancomycin x1 dose, Zosyn 3.375 g IV x1, and a 3 L IV normal saline bolus was started at 1647.   The patient's case and imaging was discussed with the on-call podiatrist, Dr. [name], who recommended admission to the hospital service for initiation of broad-spectrum IV antibiotics, as well as MRI of the foot to occur tomorrow (10/23/19).  Additionally, Dr. [name] will formally see the patient tomorrow morning, and, tentatively plans to take the patient to the OR on Friday, 10/24/2019 for definitive surgical treatment of presenting infected right diabetic foot ulcer, dependent upon interval clinical course and results of aforementioned MRI of right  foot.  Subsequently, the patient was admitted to the stepdown unit at [facility] for further evaluation and management of presenting severe sepsis due to infected right diabetic foot ulcer.     Review of Systems: As per HPI otherwise 10 point review of systems negative.   Past Medical History:  Diagnosis Date   Boil    numerous boils in past    Charcot ankle    Diabetes mellitus    diagnosed age 9   Hypertension    Neuropathy associated with endocrine disorder ([place])    Osteomyelitis of toe of right foot [facility])     Past Surgical History:  Procedure Laterality Date   AMPUTATION Right 06/17/2014   Procedure: PARTIAL AMPUTATION RIGHT 3RD TOE;  Surgeon: Marcheta Grammes, DPM;  Location: [facility];  Service: Podiatry;  Laterality: Right;   AMPUTATION Right 09/02/2014   Procedure: PARTIAL AMPUTATION 2ND TOE RIGHT FOOT;  Surgeon: Marcheta Grammes, DPM;  Location: [facility];  Service: Podiatry;  Laterality: Right;   AMPUTATION Right 04/01/2015   Procedure: AMPUTATION DIGIT 1ST TOE RIGHT FOOT;  Surgeon: Juanita Laster, DPM;  Location: [facility];  Service: Podiatry;  Laterality: Right;   AMPUTATION Right 06/29/2016   pinkeye    FOOT AMPUTATION Left    FOOT SURGERY Left 03/2017   HERNIA REPAIR Bilateral    and 3rd hernia repair does not remember ehat side    Social History:  reports that he has never smoked. He has never used smokeless tobacco. He reports that he does not drink alcohol or use drugs.   No Known Allergies  Family History  Problem Relation Age of Onset   Stroke Father    Asthma Father    COPD Father    Aneurysm Father        AAA   Diabetes Brother    Stroke Brother        2008   Diabetes Mother     Prior to Admission medications   Medication Sig Start Date End Date Taking? Authorizing Provider  atorvastatin (LIPITOR) 20 MG tablet Take 1 tablet (20 mg total) by mouth daily. 07/22/19  Yes Dettinger, [name], MD  gabapentin (NEURONTIN) 600 MG tablet Take 1 tablet (600 mg total) by mouth at bedtime. 07/22/19  Yes Dettinger, [name], MD  HYDROcodone-acetaminophen (NORCO) 10-325 MG tablet Take 1 tablet by mouth 2 (two) times daily as needed for moderate pain. 07/28/19  Yes Dettinger, [name], MD  Insulin Glargine, 2 Unit Dial, (TOUJEO MAX SOLOSTAR) 300 UNIT/ML SOPN Inject 40-50 Units into the skin  daily. 04/18/19  Yes Dettinger, [name], MD  lisinopril (PRINIVIL,ZESTRIL) 5 MG tablet Take 1 tablet (5 mg total) by mouth daily. 11/18/18  Yes Dettinger, [name], MD  metFORMIN (GLUCOPHAGE) 1000 MG tablet Take 1 tablet (1,000 mg total) by mouth 2 (two) times daily. 11/18/18  Yes Dettinger, [name], MD  albuterol (PROVENTIL HFA;VENTOLIN HFA) 108 (90 Base) MCG/ACT inhaler Inhale 2 puffs into the lungs every 6 (six) hours as needed for wheezing or shortness of breath. 01/08/19   Dettinger, [name], MD  aspirin (ASPIRIN EC) 81 MG EC tablet Take 81 mg by mouth daily. Swallow whole.    [provider]  Continuous Blood Gluc Receiver (FREESTYLE LIBRE 14 DAY READER) DEVI 1 each by Does not apply route daily. 10/14/19   Dettinger, [name], MD  Continuous Blood Gluc Sensor (FREESTYLE LIBRE 14 DAY SENSOR) MISC 1 each by Other route every 14 (fourteen) days.  10/14/19   Dettinger, [name], MD  cyclobenzaprine (FLEXERIL) 10 MG tablet Take 1 tablet (10 mg total) by mouth at bedtime as needed for muscle spasms. 12/18/18   Dettinger, [name], MD  DULoxetine (CYMBALTA) 60 MG capsule Take 1 capsule (60 mg total) by mouth daily. 12/18/18   Dettinger, [name], MD  fluticasone (FLONASE) 50 MCG/ACT nasal spray Place 1 spray into both nostrils 2 (two) times daily as needed for allergies or rhinitis. 01/08/19   Dettinger, [name], MD  HYDROcodone-acetaminophen (NORCO) 10-325 MG tablet Take 1 tablet by mouth 2 (two) times daily as needed. Do not refill until 60 days from prescription date 09/19/19   Dettinger, [name], MD  HYDROcodone-acetaminophen [facility]) 10-325 MG tablet Take 1 tablet by mouth 2 (two) times daily as needed. Do not refill until 30 days from prescription date 08/20/19   Dettinger, [name], MD  ibuprofen (ADVIL,MOTRIN) 200 MG tablet Take 200 mg by mouth every 6 (six) hours as needed for mild pain or moderate pain.     [provider]  Multiple Vitamins-Minerals (MULTIVITAMIN WITH MINERALS)  tablet Take 1 tablet by mouth daily.    [provider]  terbinafine (LAMISIL AT) 1 % cream Apply 1 application topically 2 (two) times daily. Patient not taking: Reported on 07/22/2019 07/17/19   Dettinger, [name], MD  zinc gluconate 50 MG tablet Take 50 mg by mouth daily.    [provider]    Objective    Physical Exam: Vitals:   10/22/19 1600 10/22/19 1602 10/22/19 1630 10/22/19 1647  BP: 113/74  112/72   Pulse: 97  (!) 129 (!) 137  Resp: (!) 21  (!) 26 (!) 37  Temp:  98.2 F (36.8 C)    TempSrc:  Oral    SpO2: 97%  92% 98%  Weight:      Height:        General: appears to be stated age; alert, oriented Skin: warm, dry; ulcer involving the plantar surface of the right fourth toe associated with erythema extending to the dorsal surface of the right foot, with proximal extension of erythema to the right ankle and associated with swelling and increased warmth to the touch. Head:  AT/[place] Eyes:  PEARL b/l, EOMI Mouth:  Oral mucosa membranes appear dry, normal dentition Neck: supple; trachea midline Heart: Tachycardic, irregular; did not appreciate any M/R/G Lungs: CTAB, did not appreciate any wheezes, rales, or rhonchi Abdomen: + BS; soft, ND, NT Extremities: Right lower extremity foot ulcer with associated erythema, swelling, increased warmth to touch, as further described above; no evidence of associated crepitus; left lower extremity prosthesis noted   Labs on Admission: I have personally reviewed following labs and imaging studies  CBC: Recent Labs  Lab 10/22/19 1520  WBC 12.1*  NEUTROABS 10.2*  HGB 16.0  HCT 46.1  MCV 88.0  PLT 924   Basic Metabolic Panel: Recent Labs  Lab 10/22/19 1520  NA 125*  K 3.4*  CL 89*  CO2 21*  GLUCOSE 389*  BUN 40*  CREATININE 1.42*  CALCIUM 8.8*   GFR: Estimated Creatinine Clearance: 72.2 mL/min (A) (by C-G formula based on SCr of 1.42 mg/dL (H)). Liver Function Tests: Recent Labs  Lab 10/22/19 1520    AST 28  ALT 30  ALKPHOS 123  BILITOT 2.0*  PROT 7.5  ALBUMIN 3.4*   No results for input(s): LIPASE, AMYLASE in the last 168 hours. No results for input(s): AMMONIA in the last 168 hours. Coagulation  Profile: Recent Labs  Lab 10/22/19 1520  INR 1.3*   Cardiac Enzymes: No results for input(s): CKTOTAL, CKMB, CKMBINDEX, TROPONINI in the last 168 hours. BNP (last 3 results) No results for input(s): PROBNP in the last 8760 hours. HbA1C: No results for input(s): HGBA1C in the last 72 hours. CBG: Recent Labs  Lab 10/22/19 1448  GLUCAP 395*   Lipid Profile: No results for input(s): CHOL, HDL, LDLCALC, TRIG, CHOLHDL, LDLDIRECT in the last 72 hours. Thyroid Function Tests: No results for input(s): TSH, T4TOTAL, FREET4, T3FREE, THYROIDAB in the last 72 hours. Anemia Panel: No results for input(s): VITAMINB12, FOLATE, FERRITIN, TIBC, IRON, RETICCTPCT in the last 72 hours. Urine analysis:    Component Value Date/Time   COLORURINE YELLOW 06/13/2014 1048   APPEARANCEUR CLEAR 06/13/2014 1048   LABSPEC 1.020 06/13/2014 1048   PHURINE 6.0 06/13/2014 1048   GLUCOSEU >1000 (A) 06/13/2014 1048   HGBUR NEGATIVE 06/13/2014 1048   BILIRUBINUR NEGATIVE 06/13/2014 1048   BILIRUBINUR negative 03/26/2014 1212   KETONESUR NEGATIVE 06/13/2014 1048   PROTEINUR NEGATIVE 06/13/2014 1048   UROBILINOGEN 0.2 06/13/2014 1048   NITRITE NEGATIVE 06/13/2014 1048   LEUKOCYTESUR NEGATIVE 06/13/2014 1048    Radiological Exams on Admission: DG Tibia/Fibula Right  Result Date: 10/22/2019 CLINICAL DATA:  Right leg pain and tenderness. Fever. Clinical evidence of infection. Diabetes. EXAM: RIGHT TIBIA AND FIBULA - 2 VIEW COMPARISON:  Right ankle radiographs obtained at the same time. FINDINGS: Mild diffuse soft tissue swelling. Previously described chronic appearing periosteal reaction involving the distal tibia. On these radiographs, this is involving the medial and posterior aspects of the tibia. Also  again noted are previously described bone collapse and fragmentation involving the talotibial joint and flattening of the normal plantar arch. No soft tissue gas is seen. Mild medial and lateral spur formation at the knee joint with mild to moderate medial joint space narrowing. IMPRESSION: 1. Chronic appearing periosteal reaction involving the posterior and medial aspects of the distal tibia. Underlying chronic osteomyelitis is a possibility. 2. Diffuse soft tissue swelling without gas or bone destruction. 3. 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 Electronically Signed   By: Claudie Revering M.D.   On: 10/22/2019 16:05   DG Ankle Complete Right  Result Date: 10/22/2019 CLINICAL DATA:  Right leg pain and tenderness and fever with clinical evidence of infection. Diabetes. EXAM: RIGHT ANKLE - COMPLETE 3+ VIEW COMPARISON:  Right foot radiographs obtained today and on 06/19/2016. FINDINGS: Diffuse soft tissue swelling. Bone collapse and fragmentation at the talotibial joint, described on the foot radiographs, in addition to previously described flattening of the normal plantar arch. Again demonstrated are is chronic appearing posterior periosteal reaction involving the distal tibia. No bone destruction or soft tissue gas seen. IMPRESSION: 1. Diffuse soft tissue swelling without underlying changes of acute osteomyelitis. 2. Neuropathic changes at the talotibial joint. 3. Pes planus. Electronically Signed   By: Claudie Revering M.D.   On: 10/22/2019 16:02   DG Chest Port 1 View  Result Date: 10/22/2019 CLINICAL DATA:  Fever EXAM: PORTABLE CHEST 1 VIEW COMPARISON:  12/09/2013 chest radiograph. FINDINGS: Stable cardiomediastinal silhouette with top-normal heart size. No pneumothorax. No pleural effusion. Lungs appear clear, with no acute consolidative airspace disease and no pulmonary edema. IMPRESSION: No active disease. Electronically Signed   By: Ilona Sorrel M.D.   On: 10/22/2019 15:55   DG  Foot Complete Right  Result Date: 10/22/2019 CLINICAL DATA:  Right foot pain and fever. Diabetes. Clinical infection.  EXAM: RIGHT FOOT COMPLETE - 3+ VIEW COMPARISON:  06/19/2016. FINDINGS: Diffuse soft tissue swelling. Again demonstrated surgical absence of the right great toe with interval surgical absence of the right little toe. Well-defined bone margins. No periosteal reaction or bone destruction involving the bones of the foot. There is flattening of the normal plantar arch and interval partial bone collapse and fragmentation involving the distal tibia and talus. There is also chronic appearing periosteal thickening and irregularity involving the distal tibia posteriorly. IMPRESSION: 1. Diffuse soft tissue swelling without soft tissue gas or evidence of underlying osteomyelitis. 2. Pes planus. 3. 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 Electronically Signed   By: Claudie Revering M.D.   On: 10/22/2019 15:59    EKG: Independently reviewed, with result as described above.   Assessment/Plan   [name] is a 53 y.o. male with medical history significant for poorly controlled type 2 diabetes mellitus with most recent hemoglobin A1c 9.7% in March 7619 and complicated by diabetic polyperipheral neuropathy, recurrent diabetic foot ulcers complicated by osteomyelitis status post left below-knee amputation as well as amputation of multiple toes on right foot, hypertension, who was admitted to [facility] on 10/22/2019 with severe sepsis due to right lower extremity cellulitis after presenting from home to the [facility] emergency department complaining of progressive right lower extremity erythema.    Principal Problem:   Severe sepsis ([place]) Active Problems:   Essential hypertension, benign   Type 2 diabetes mellitus (HCC)   Cellulitis of right lower extremity   Elevated lactic acid level   Leukocytosis   Atrial fibrillation with RVR (HCC)   Nausea  & vomiting   Diarrhea   AKI (acute kidney injury) (HCC)   Hypokalemia   Acute hyponatremia   #) Severe sepsis due to right lower extremity cellulitis: Diagnosis on the basis of approximately 10-day progression of right foot erythema associated with increased swelling, increased warmth to the touch, and development of purulent discharge increasing the likelihood of MRSA as the underlying infectious agent.  The source appears to be an infected right diabetic foot ulcer associated with the plantar surface of the right fourth toe, with progression of the above symptoms in spite of interval oral doxycycline since 10/10/2019, as further described above.  Predisposing factors include a history of poorly controlled type 2 diabetes mellitus. presentation may also be associated with underlying osteomyelitis of the right fourth toe, particularly given the patient's history of osteomyelitis resulting in amputation of multiple toes on the right as well as left BKA.  Of note, initial plain films of the right lower extremity show diffuse soft tissue swelling in the absence of soft tissue gas or evidence of osteomyelitis.  Additionally, no evidence of crepitus on physical exam, thereby decreasing likelihood of necrotizing fasciitis.   SIRS criteria met via presenting objective fever, tachycardia, tachypnea, and leukocytosis, with criteria met for patient sepsis to be considered severe nature on the basis of an elevated lactic acid of 3.8 at 1520.  Of note, presentation has not been associated with any evidence of hypotension thus far.  At this time, no confirmed additional sources of underlying infection, although additional work-up is in process.  Chest x-ray showed no evidence of acute cardiopulmonary process, including no evidence of infiltrate.  Urinalysis is currently pending.  No evidence of respiratory symptoms nor any known COVID-19 exposures, with standard admission nasopharyngeal COVID-19 PCR currently pending.   In the setting of recent diarrhea, C. Difficile evaluation has  also been ordered, with sample still to be collected.  The patient's case and imaging was discussed with the on-call podiatrist, Dr. [name], who recommended admission to the hospital service for initiation of broad-spectrum IV antibiotics, as well as MRI of the foot to occur tomorrow (10/23/19).  Additionally, Dr. [name] will formally see the patient tomorrow morning, and, tentatively plans to take the patient to the OR on Friday, 10/24/2019 for definitive surgical treatment of presenting infected right diabetic foot ulcer, dependent upon interval clinical course and results of aforementioned MRI of right foot.  Subsequently, blood cultures x2 were obtained followed by initiation of IV vancomycin and Zosyn. 30 mL/kg IV NS bolus was initiated at 1647.    Plan: In the setting of associated severe sepsis, will continue IV vancomycin and Zosyn.  Monitor closely for results blood cultures x2 collected this evening prior to initiation of IV antibiotics.  Complete 30 mL/kg IV NS bolus initiated in the ED, and well follow for result of repeat lactic acid ordered for 1700 this evening. Repeat CBC with differential in the morning.  Add on CRP and ESR to labs collected in the ED. formal podiatry consult in the morning (10/23/19), will Dr. [name] planning evaluate the patient at that time. Per Dr. [name], MRI of the right foot in the morning.  I have placed nursing communication orders asking that current distribution of erythema be outlined, and that affect extremity be elevated to help decrease associated swelling.  Follow for result of urinalysis, COVID-19 PCR, and C diff, as above.      #) Atrial fibrillation with RVR: Patient presents in atrial fibrillation with initial ventricular rates in the 170s, with rates improving into the range of 110s - 120's bpm following initiation of diltiazem drip, IV fluids, and IV antibiotics, as above.  This  appears to represent a new diagnosis for the patient, as he denies any prior diagnosis of atrial fibrillation.  He appears to be largely asymptomatic with respect to his A. fib with RVR, and blood pressure appears to be tolerating the associated ventricular rates as well as the diltiazem drip.  In the setting of a CHA2DS2-VASc score of 2 (DM2, HTN), there is an indication for the patient to be on chronic anticoagulation for thromboembolic prophylaxis. While anticoagulation for such will not be started at this time in the setting of suspected impending inpatient surgical procedure for presenting infection, as above, will need to subsequently discussed with the patient the indications as well as risks, benefits, and alternatives of anticoagulation following aforementioned suspected podiatric surgery later this week.  In a nonurgent setting, the patient would also ultimately benefit from an echocardiogram as portion of standard evaluation for new dx of A fib. Per chart review, no prior echocardiogram on file. Predisposing factors leading to presenting A Fib with RVR include physiologic stress stemming from presenting severe sepsis in setting of infected right diabetic foot wound, as above.   Plan: Continue diltiazem drip, titrated for goal heart rate of less than 110 bpm, while closely monitoring ensuing blood pressures diverticulitis setting of concomitant presenting severe sepsis.  Work-up and management of severe sepsis, including IV antibiotics, as above.  Monitor on telemetry.  Will add on serum magnesium level to labs collected in the ED.  Gentle IV potassium supplementation, as further described below.  Repeat BMP in the morning as well as serum magnesium level at that time.  Postoperative discussions regarding the possibility of initiation of chronic anticoagulation, as further described above.  Check  reflex TSH.       #) Nausea, vomiting, diarrhea: Multiple episodes of nausea, vomiting, diarrhea over  the last 3 to 4 days, in the absence of any associated hematemesis, melena, hematochezia, or abdominal discomfort, as further quantified above.  Differential includes potential noninfectious consequence of interval doxycycline. Given recent use of abx, evaluation for C difficile was also initiated in the ED this evening. Will also follow for result of standard admission nasopharyngeal COVID-19 PCR screen.   Plan: IV fluids as above.  Hold home doxycycline.  Add on serum magnesium level.  Repeat CMP and serum magnesium level in the morning.  Monitor strict I's and O's.  Follow for results of C. difficile screen as well as  nasopharyngeal COVID-19 PCR, as above.  As needed IV Zofran.       #) Acute kidney injury: Presenting labs reflect creatinine of 1.42 relative to most recent prior creatinine of 0.95 when checked in September 2020, and associated with prerenal azotemia with BUN to creatinine ratio of 28.  Appears prerenal in nature in the setting of intravascular depletion stemming from increased GI losses in the form of recent nausea, vomiting, and diarrhea, with additional prerenal contribution from presenting severe sepsis due to suspected infected right diabetic foot ulcer, as above.  Potential additional pharmacologic exacerbation from outpatient lisinopril.   Plan: We will follow for results of urinalysis ordered in the emergency department this evening.  We will also check random urine sodium as well as random urine creatinine.  IV fluids as above.  Monitor strict I's and O's and attempt to avoid nephrotoxic agents.  As such, will hold home lisinopril as well as home gabapentin.  Repeat BMP in the morning.      #) Hypokalemia: Present labs reflect serum potassium of 3.4, with likely contribution stemming from recent increase in GI losses in the form of nausea, vomiting, diarrhea.  Will provide IV potassium supplementation, but will be slightly less aggressive in this repletion given  concomitant acute kidney injury.  Plan: Potassium chloride 20 mEq IV over 2 hours x 1 now, with repeat BMP in the morning.  Add on serum magnesium level to emergency department labs.  We will also repeat serum magnesium level in the morning.  Monitor on telemetry. Workup and management of presenting nausea, vomiting, diarrhea, as above.      #) Acute hypoosmolar hypovolemic hyponatremia: Presenting labs reflect serum sodium of 125, which corrects to nearly 130 when taking into account concomitant hyperglycemia.  Appears to be acute in nature, with most recent prior serum sodium level noted to be 139 in September 2020.  Overall, appears to be hypovolemic on the basis of increased GI losses in the form of recent nausea, vomiting, and diarrhea, as further described above.  Of note, the patient denies any recent trauma, and presenting chest x-ray shows no evidence of acute cardiopulmonary process.  Plan: Check urinalysis, random urine sodium, and urine osmolality.  IV fluids, as above.  Monitor strict I's and O's.  Will repeat BMP in the morning.  Work-up and management of presenting nausea, vomiting, and diarrhea, as above. Will also check reflex TSH.      #) Type 2 diabetes mellitus: Historically, appears to be poorly controlled, with most recent prior hemoglobin A1c noted to be 9.7% when checked in March 2019.  Presenting glucose per presenting CMP noted to be elevated at 389, with potential relative exacerbation of patient's hyperglycemia in the setting of physiologic stress stemming from presenting acute infection,  as above.  The patient reports that his outpatient insulin regimen consists of Toujeo 40-50 units Rogers QAM, with most recent dose occurring on the morning of 10/21/2019, as described above.  The patient reports that at baseline he takes a minimum of 40 units of Toujeo every morning, and has been provided by his PCP with a sliding scale chart to determine how many additional units of Toujeo  beyond 40 that he should take with his morning dose.  On average, the patient reports that he takes between 40-50 units of Toujeo in the morning. does not appear to be on any short acting insulin.  He is also on Metformin on an outpatient basis.  Poorly controlled diabetes as an outpatient appears to be a contributing factor relating to his recurrent foot ulcers, as above.  Patient's diabetes also appears complicated by diabetic polyperipheral neuropathy, for which he is on gabapentin at home.  Plan: will start with Lantus 20 units Gurley QHS, with first dose now given that the patient missed his dose of basal insulin this morning.  Accu-Cheks before every meal and at bedtime with moderate dose sliding scale insulin.  Check hemoglobin A1c.  In the setting of acute kidney injury, will hold home gabapentin for now.       #) History of essential hypertension: On lisinopril as an outpatient.  Since presentation to the ED today, systolic blood pressures have been in the range of 110's - 120's mmHg. in the setting of presenting infection as well as acute kidney injury, will hold home lisinopril for now.  Plan: Hold home lisinopril for now, as above.  Close monitoring of ensuing blood pressures, particularly given ongoing diltiazem drip in the setting of presenting atrial fibrillation with RVR, as above.    DVT prophylaxis: scd's on RLE; of note, has history of left BKA. Additionally, will refrain from pharmacologic DVT prophylaxis at this time given suspected podiatric surgery anticipated for Friday, 10/24/19.  Code Status: Full code Family Communication: None Disposition Plan:  Per Rounding Team Consults called: Dr. [name] of podiatry, as Deneen Harts.  Admission status: Inpatient to stepdown unit at [facility] in setting of presenting A. fib with RVR.    PLEASE NOTE THAT DRAGON DICTATION SOFTWARE WAS USED IN THE CONSTRUCTION OF THIS NOTE.   Valinda [facility] Pager  [phone] From 3PM- 11PM.   Otherwise, please contact night-coverage  [email or web address] Password [email or web address]  10/22/2019, 5:00 PM

## 2019-10-23 ENCOUNTER — Encounter (HOSPITAL_COMMUNITY): Payer: Self-pay | Admitting: Internal Medicine

## 2019-10-23 ENCOUNTER — Inpatient Hospital Stay (HOSPITAL_COMMUNITY): Payer: Medicare Other

## 2019-10-23 DIAGNOSIS — Z01810 Encounter for preprocedural cardiovascular examination: Secondary | ICD-10-CM

## 2019-10-23 DIAGNOSIS — E1110 Type 2 diabetes mellitus with ketoacidosis without coma: Secondary | ICD-10-CM | POA: Diagnosis present

## 2019-10-23 DIAGNOSIS — R7881 Bacteremia: Secondary | ICD-10-CM

## 2019-10-23 DIAGNOSIS — R6520 Severe sepsis without septic shock: Secondary | ICD-10-CM

## 2019-10-23 DIAGNOSIS — I4891 Unspecified atrial fibrillation: Secondary | ICD-10-CM

## 2019-10-23 DIAGNOSIS — E11628 Type 2 diabetes mellitus with other skin complications: Secondary | ICD-10-CM

## 2019-10-23 DIAGNOSIS — L089 Local infection of the skin and subcutaneous tissue, unspecified: Secondary | ICD-10-CM

## 2019-10-23 DIAGNOSIS — A419 Sepsis, unspecified organism: Secondary | ICD-10-CM

## 2019-10-23 DIAGNOSIS — Z89512 Acquired absence of left leg below knee: Secondary | ICD-10-CM

## 2019-10-23 DIAGNOSIS — E1165 Type 2 diabetes mellitus with hyperglycemia: Secondary | ICD-10-CM

## 2019-10-23 LAB — URINE CULTURE: Culture: <10000 — AB

## 2019-10-23 LAB — GLUCOSE, CAPILLARY
Glucose-Capillary: 257 mg/dL — ABNORMAL HIGH (ref 70–99)
Glucose-Capillary: 242 mg/dL — ABNORMAL HIGH (ref 70–99)
Glucose-Capillary: 257 mg/dL — ABNORMAL HIGH (ref 70–99)

## 2019-10-23 LAB — HEPARIN LEVEL (UNFRACTIONATED)
Heparin Unfractionated: <0.1 IU/mL — ABNORMAL LOW (ref 0.30–0.70)
Heparin Unfractionated: <0.1 IU/mL — ABNORMAL LOW (ref 0.30–0.70)

## 2019-10-23 LAB — CBC WITH DIFFERENTIAL/PLATELET
Abs Immature Granulocytes: 0.02 10*3/uL (ref 0.00–0.07)
Basophils Absolute: 0 10*3/uL (ref 0.0–0.1)
Basophils Relative: 0 %
Eosinophils Absolute: 0 10*3/uL (ref 0.0–0.5)
Eosinophils Relative: 0 %
HCT: 39 % (ref 39.0–52.0)
Hemoglobin: 13.8 g/dL (ref 13.0–17.0)
Immature Granulocytes: 0 %
Lymphocytes Relative: 7 %
Lymphs Abs: 0.6 10*3/uL — ABNORMAL LOW (ref 0.7–4.0)
MCH: 31.1 pg (ref 26.0–34.0)
MCHC: 35.4 g/dL (ref 30.0–36.0)
MCV: 87.8 fL (ref 80.0–100.0)
Monocytes Absolute: 0.8 10*3/uL (ref 0.1–1.0)
Monocytes Relative: 10 %
Neutro Abs: 6.3 10*3/uL (ref 1.7–7.7)
Neutrophils Relative %: 83 %
Platelets: 123 10*3/uL — ABNORMAL LOW (ref 150–400)
RBC: 4.44 MIL/uL (ref 4.22–5.81)
RDW: 11.9 % (ref 11.5–15.5)
WBC Morphology: INCREASED
WBC: 7.7 10*3/uL (ref 4.0–10.5)
nRBC: 0 % (ref 0.0–0.2)

## 2019-10-23 LAB — TSH: TSH: 0.929 u[IU]/mL (ref 0.350–4.500)

## 2019-10-23 LAB — COMPREHENSIVE METABOLIC PANEL
ALT: 28 U/L (ref 0–44)
AST: 25 U/L (ref 15–41)
Albumin: 2.6 g/dL — ABNORMAL LOW (ref 3.5–5.0)
Alkaline Phosphatase: 83 U/L (ref 38–126)
Anion gap: 12 (ref 5–15)
BUN: 31 mg/dL — ABNORMAL HIGH (ref 6–20)
CO2: 22 mmol/L (ref 22–32)
Calcium: 8.5 mg/dL — ABNORMAL LOW (ref 8.9–10.3)
Chloride: 98 mmol/L (ref 98–111)
Creatinine, Ser: 0.97 mg/dL (ref 0.61–1.24)
GFR calc Af Amer: >60 mL/min (ref >60)
GFR calc non Af Amer: >60 mL/min (ref >60)
Glucose, Bld: 266 mg/dL — ABNORMAL HIGH (ref 70–99)
Potassium: 3.3 mmol/L — ABNORMAL LOW (ref 3.5–5.1)
Sodium: 132 mmol/L — ABNORMAL LOW (ref 135–145)
Total Bilirubin: 1.6 mg/dL — ABNORMAL HIGH (ref 0.3–1.2)
Total Protein: 6 g/dL — ABNORMAL LOW (ref 6.5–8.1)

## 2019-10-23 LAB — MRSA PCR SCREENING: MRSA by PCR: NEGATIVE

## 2019-10-23 LAB — C DIFFICILE QUICK SCREEN W PCR REFLEX
C Diff antigen: NEGATIVE
C Diff interpretation: NOT DETECTED
C Diff toxin: NEGATIVE

## 2019-10-23 LAB — HEMOGLOBIN A1C
Hgb A1c MFr Bld: 9.8 % — ABNORMAL HIGH (ref 4.8–5.6)
Mean Plasma Glucose: 234.56 mg/dL

## 2019-10-23 LAB — ECHOCARDIOGRAM COMPLETE
Height: 76 in
Weight: 3308.66 oz

## 2019-10-23 LAB — PROCALCITONIN: Procalcitonin: 10.37 ng/mL

## 2019-10-23 LAB — MAGNESIUM: Magnesium: 1.7 mg/dL (ref 1.7–2.4)

## 2019-10-23 LAB — LACTIC ACID, PLASMA: Lactic Acid, Venous: 2.2 mmol/L (ref 0.5–1.9)

## 2019-10-23 LAB — SARS CORONAVIRUS 2 (TAT 6-24 HRS): SARS Coronavirus 2: NEGATIVE

## 2019-10-23 LAB — HIV ANTIBODY (ROUTINE TESTING W REFLEX): HIV Screen 4th Generation wRfx: NONREACTIVE

## 2019-10-23 MED ORDER — POTASSIUM CHLORIDE IN NACL 20-0.9 MEQ/L-% IV SOLN: INTRAVENOUS | Status: DC

## 2019-10-23 MED ORDER — AMIODARONE HCL IN DEXTROSE 360-4.14 MG/200ML-% IV SOLN
30.0000 mg/h | INTRAVENOUS | Status: AC
  Administered 2019-10-23 – 2019-10-26 (×5): 30 mg/h via INTRAVENOUS
  Filled 2019-10-23 (×5): qty 200

## 2019-10-23 MED ORDER — MAGNESIUM SULFATE 2 GM/50ML IV SOLN
2.0000 g | Freq: Once | INTRAVENOUS | Status: AC
  Administered 2019-10-23: 2 g via INTRAVENOUS
  Filled 2019-10-23: qty 50

## 2019-10-23 MED ORDER — LOPERAMIDE HCL 2 MG PO CAPS
2.0000 mg | ORAL_CAPSULE | Freq: Once | ORAL | Status: AC
  Administered 2019-10-23: 2 mg via ORAL
  Filled 2019-10-23: qty 1

## 2019-10-23 MED ORDER — ONDANSETRON HCL 4 MG/2ML IJ SOLN
4.0000 mg | Freq: Four times a day (QID) | INTRAMUSCULAR | Status: DC
  Administered 2019-10-23 – 2019-10-25 (×8): 4 mg via INTRAVENOUS
  Filled 2019-10-23 (×8): qty 2

## 2019-10-23 MED ORDER — HEPARIN BOLUS VIA INFUSION
5000.0000 [IU] | Freq: Once | INTRAVENOUS | Status: AC
  Administered 2019-10-23: 5000 [IU] via INTRAVENOUS
  Filled 2019-10-23: qty 5000

## 2019-10-23 MED ORDER — HEPARIN (PORCINE) 25000 UT/250ML-% IV SOLN
2000.0000 [IU]/h | INTRAVENOUS | Status: DC
  Administered 2019-10-23: 1400 [IU]/h via INTRAVENOUS
  Administered 2019-10-23: 1700 [IU]/h via INTRAVENOUS
  Filled 2019-10-23 (×2): qty 250

## 2019-10-23 MED ORDER — VANCOMYCIN HCL IN DEXTROSE 1-5 GM/200ML-% IV SOLN
1000.0000 mg | Freq: Three times a day (TID) | INTRAVENOUS | Status: DC
  Administered 2019-10-23 – 2019-10-25 (×7): 1000 mg via INTRAVENOUS
  Filled 2019-10-23 (×7): qty 200

## 2019-10-23 MED ORDER — AMIODARONE HCL IN DEXTROSE 360-4.14 MG/200ML-% IV SOLN
60.0000 mg/h | INTRAVENOUS | Status: AC
  Administered 2019-10-23: 60 mg/h via INTRAVENOUS
  Filled 2019-10-23: qty 200

## 2019-10-23 MED ORDER — KCL-LACTATED RINGERS 20 MEQ/L IV SOLN
INTRAVENOUS | Status: DC
  Filled 2019-10-23 (×4): qty 1000
  Filled 2019-10-23 (×3): qty 20
  Filled 2019-10-23 (×6): qty 1000

## 2019-10-23 MED ORDER — HEPARIN BOLUS VIA INFUSION
2800.0000 [IU] | Freq: Once | INTRAVENOUS | Status: AC
  Administered 2019-10-23: 2800 [IU] via INTRAVENOUS
  Filled 2019-10-23: qty 2800

## 2019-10-23 MED ORDER — GADOBUTROL 1 MMOL/ML IV SOLN
10.0000 mL | Freq: Once | INTRAVENOUS | Status: AC | PRN
  Administered 2019-10-23: 10 mL via INTRAVENOUS

## 2019-10-23 MED ORDER — POTASSIUM CHLORIDE 10 MEQ/100ML IV SOLN
10.0000 meq | INTRAVENOUS | Status: AC
  Administered 2019-10-23 (×4): 10 meq via INTRAVENOUS
  Filled 2019-10-23 (×4): qty 100

## 2019-10-23 MED ORDER — HEPARIN BOLUS VIA INFUSION
3000.0000 [IU] | Freq: Once | INTRAVENOUS | Status: AC
  Administered 2019-10-24: 3000 [IU] via INTRAVENOUS
  Filled 2019-10-23: qty 3000

## 2019-10-23 MED ORDER — PROMETHAZINE HCL 25 MG/ML IJ SOLN: 12.5000 mg | Freq: Four times a day (QID) | INTRAMUSCULAR | Status: DC | PRN

## 2019-10-23 MED ORDER — SODIUM CHLORIDE 0.9 % IV BOLUS
1000.0000 mL | Freq: Once | INTRAVENOUS | Status: AC
  Administered 2019-10-23: 1000 mL via INTRAVENOUS

## 2019-10-23 NOTE — Progress Notes (Signed)
*  PRELIMINARY RESULTS* Echocardiogram 2D Echocardiogram has been performed.  Samuel Germany 10/23/2019, 9:17 AM

## 2019-10-23 NOTE — Progress Notes (Signed)
ANTICOAGULATION CONSULT NOTE    Pharmacy Consult for Heparin Indication: atrial fibrillation  No Known Allergies  Patient Measurements: Height: 6\' 4"  (193 cm) Weight: 206 lb 12.7 oz (93.8 kg) IBW/kg (Calculated) : 86.8 Heparin Dosing Weight: 94 kg  Vital Signs: Temp: 99.6 F (37.6 C) (12/17 1730) Temp Source: Oral (12/17 1730) BP: 118/69 (12/17 2300) Pulse Rate: 102 (12/17 2300)  Labs: Recent Labs    10/22/19 1520 10/23/19 0359 10/23/19 1437 10/23/19 2218  HGB 16.0 13.8  --   --   HCT 46.1 39.0  --   --   PLT 176 123*  --   --   APTT 30  --   --   --   LABPROT 15.9*  --   --   --   INR 1.3*  --   --   --   HEPARINUNFRC  --   --  <0.10* <0.10*  CREATININE 1.42* 0.97  --   --     Estimated Creatinine Clearance: 105.6 mL/min (by C-G formula based on SCr of 0.97 mg/dL).   Medical History: Past Medical History:  Diagnosis Date  . Charcot ankle   . Hypertension   . Osteomyelitis of toe of right foot (HCC)   . Recurrent boils   . Type 2 diabetes mellitus with diabetic neuropathy [facility])    Diagnosed age 86    Medications:  Medications Prior to Admission  Medication Sig Dispense Refill Last Dose  . atorvastatin (LIPITOR) 20 MG tablet Take 1 tablet (20 mg total) by mouth daily. 90 tablet 3   . doxycycline (VIBRA-TABS) 100 MG tablet Take 100 mg by mouth 2 (two) times daily. Started course on 10/10/2019     . gabapentin (NEURONTIN) 600 MG tablet Take 1 tablet (600 mg total) by mouth at bedtime. 90 tablet 3   . Insulin Glargine, 2 Unit Dial, (TOUJEO MAX SOLOSTAR) 300 UNIT/ML SOPN Inject 40-50 Units into the skin daily. 5 pen 5   . lisinopril (PRINIVIL,ZESTRIL) 5 MG tablet Take 1 tablet (5 mg total) by mouth daily. 90 tablet 3   . metFORMIN (GLUCOPHAGE) 1000 MG tablet Take 1 tablet (1,000 mg total) by mouth 2 (two) times daily. 180 tablet 3 10/22/2019  . albuterol (PROVENTIL HFA;VENTOLIN HFA) 108 (90 Base) MCG/ACT inhaler Inhale 2 puffs into the lungs every 6 (six) hours  as needed for wheezing or shortness of breath. 1 Inhaler 0   . aspirin (ASPIRIN EC) 81 MG EC tablet Take 81 mg by mouth daily. Swallow whole.     . Continuous Blood Gluc Receiver (FREESTYLE LIBRE 14 DAY READER) DEVI 1 each by Does not apply route daily. (Patient not taking: Reported on 10/22/2019) 1 each 0 Not Taking at Unknown time  . Continuous Blood Gluc Sensor (FREESTYLE LIBRE 14 DAY SENSOR) MISC 1 each by Other route every 14 (fourteen) days. (Patient not taking: Reported on 10/22/2019) 6 each 3 Not Taking at Unknown time  . cyclobenzaprine (FLEXERIL) 10 MG tablet Take 1 tablet (10 mg total) by mouth at bedtime as needed for muscle spasms. 30 tablet 3   . DULoxetine (CYMBALTA) 60 MG capsule Take 1 capsule (60 mg total) by mouth daily. 90 capsule 3   . fluticasone (FLONASE) 50 MCG/ACT nasal spray Place 1 spray into both nostrils 2 (two) times daily as needed for allergies or rhinitis. 16 g 6   . HYDROcodone-acetaminophen (NORCO) 10-325 MG tablet Take 1 tablet by mouth 2 (two) times daily as needed. Do not refill until 60  days from prescription date 60 tablet 0   . HYDROcodone-acetaminophen (NORCO) 10-325 MG tablet Take 1 tablet by mouth 2 (two) times daily as needed. Do not refill until 30 days from prescription date (Patient not taking: Reported on 10/22/2019) 60 tablet 0 Not Taking at Unknown time  . HYDROcodone-acetaminophen (NORCO) 10-325 MG tablet Take 1 tablet by mouth 2 (two) times daily as needed for moderate pain. (Patient not taking: Reported on 10/22/2019) 60 tablet 0 Not Taking at Unknown time  . ibuprofen (ADVIL,MOTRIN) 200 MG tablet Take 200 mg by mouth every 6 (six) hours as needed for mild pain or moderate pain.      . Multiple Vitamins-Minerals (MULTIVITAMIN WITH MINERALS) tablet Take 1 tablet by mouth daily.     Marland Kitchen terbinafine (LAMISIL AT) 1 % cream Apply 1 application topically 2 (two) times daily. (Patient not taking: Reported on 07/22/2019) 42 g 0   . zinc gluconate 50 MG tablet  Take 50 mg by mouth daily.       Assessment: Pharmacy consulted to dose heparin in patient with atrial fibrillation.  Patient is not on anticoagulation prior to admission.  Heparin level remains undetectable No issues per RN  Goal of Therapy:  Heparin level 0.3-0.7 units/ml Monitor platelets by anticoagulation protocol: Yes    Plan:  Rebolus heparin 3000 units x 1 Increase heparin infusion to 2000 units/hr Check anti-Xa level in 6-8 hours and daily while on heparin. Continue to monitor H&H and platelets.  Narda Bonds, PharmD, BCPS Clinical Pharmacist Phone: [phone]

## 2019-10-23 NOTE — Progress Notes (Addendum)
PROGRESS NOTE  [name] BWI:[id number] DOB: Apr 24, 1966 DOA: 10/22/2019 PCP: Dettinger, [name], MD  Brief History:  53 year old male with a history of diabetes mellitus type 2, diabetic polyneuropathy, hypertension, left BKA, and right toe amputations secondary to diabetic foot infections presenting with 1 day history of increasing erythema, edema, and pain on his right foot.  The patient stated that his podiatrist, Dr. [name], had started him on 10 days of doxycycline on 10/10/2019 for his diabetic foot ulcer on his right fourth toe on 10/10/2019.  The patient is a bit vague regarding the history of his most recent foot ulcer on his right fourth toe, but stated that he has noted some intermittent drainage in the past 1 to 2 days prior to admission.  He had denied any fevers, chills, headache, neck pain, chest pain, shortness breath, cough, hemoptysis, abdominal pain, dysuria, hematuria.  The patient did have episodes of nausea and vomiting for the past 3 days.  He is also noted some loose stools without any hematochezia or melena.  Because of his nausea, vomiting, and right foot erythema and pain he presented for further evaluation.  In the ED, the patient had temperature 100.6 F.  He was noted to have atrial fibrillation with RVR with heart rates into the 180s.  The patient was hemodynamically stable with oxygen saturation 96-97% on room air.  WBC was 12.1 with hemoglobin 16.0 and platelets 176K.  Sodium was 125 with serum creatinine 1.42.  Lactic acid peaked at 3.8.  The patient was fluid resuscitated and started on vancomycin and Zosyn.  Blood cultures from the emergency department were also obtained.  Assessment/Plan: Sepsis with organ dysfunction -Secondary to diabetic foot infection and bacteremia -Lactic acid peaked 3.8 -check PCT -Continue vancomycin -Discontinue Zosyn -Continue IV fluids -SARS-CoV2--neg  Diabetic foot infection/cellulitis right foot -Podiatry, Dr.  [name] has been consulted -MRI foot -CRP 40.7 -ESR 43  Bacteremia -Source is likely foot infection -Repeat blood cultures -Gram-positive cocci 2 out of 2 sets -Continue vancomycin pending culture data -Discontinue Zosyn  DKA, type 2 -pt presented with serum glucose 389 with anion gap 15 and ketonuria -resolved with aggressive fluid resuscitation -transition to [place] insulin  AKI -Resulting from sepsis -Baseline creatinine 0.7-1.0 -Serum creatinine peaked 1.42 -A.m. BMP  Atrial fibrillation with RVR, type unspecified -CHADS-VASc = 3 (HTN, DM2, PAD) -Start IV heparin in anticipation for possible surgery on foot -Echo -TSH--0.929  Uncontrolled diabetes mellitus type 2 with hyperglycemia -Anticipate elevated CBGs secondary to sepsis -07/22/2019 hemoglobin A1c 12.4 -Continue reduced dose Lantus -NovoLog sliding scale -Holding metformin -repeat A1C  Hypokalemia/hypomagnesemia -Replete -Add potassium to IV fluids  Hyponatremia -Multifactorial including hyperglycemia, volume depletion, AKI -Continue fluid resuscitation  Intractable nausea and vomiting -Suspect a component of gastritis/diabetic gastroparesis incited by sepsis and DKA -Start around-the-clock Zofran  Diarrhea -C. difficile negative -If persistent, check stool pathogen panel  Essential hypertension -Blood pressure currently controlled on diltiazem drip -Holding lisinopril secondary to AKI  Hyperlipidemia -Continue statin when able to tolerate po       Disposition Plan:   Remain in ICU  Family Communication: No  Family at bedside  Consultants:  Cardiology, podiatry  Code Status:  FULL  DVT Prophylaxis:  IV Heparin    Procedures: As Listed in Progress Note Above  Antibiotics: vanco 12/16>>> Zosyn 12/16>>>12/17   The patient is critically ill with multiple organ systems failure and requires high complexity decision making for assessment and support,  frequent evaluation and titration  of therapies, application of advanced monitoring technologies and extensive interpretation of multiple databases.  Critical care time - 45 mins.      Subjective: Patient continues to have nausea and vomiting but is feeling better than yesterday.  He denies any fevers, chills, headache, chest pain, shortness breath, abdominal pain, dysuria, hematuria, flank pain, hematochezia, melena.  Loose stools have somewhat improved.  There is no hematemesis.  Objective: Vitals:   10/23/19 0300 10/23/19 0400 10/23/19 0500 10/23/19 0600  BP: 116/69 (!) 110/57 120/69 121/68  Pulse: (!) 120 (!) 122 91 (!) 120  Resp: (!) 39 (!) 23 (!) 25 (!) 24  Temp:  98 F (36.7 C)    TempSrc:  Axillary    SpO2: 94% 95% 96% 97%  Weight:   93.8 kg   Height:        Intake/Output Summary (Last 24 hours) at 10/23/2019 0724 Last data filed at 10/23/2019 4650 Gross per 24 hour  Intake 4461.25 ml  Output 800 ml  Net 3661.25 ml   Weight change:  Exam:   General:  Pt is alert, follows commands appropriately, not in acute distress  HEENT: No icterus, No thrush, No neck mass, [place]/AT  Cardiovascular: RRR, S1/S2, no rubs, no gallops  Respiratory: CTA bilaterally, no wheezing, no crackles, no rhonchi  Abdomen: Soft/+BS, non tender, non distended, no guarding  Extremities: See pictures of the right foot below; left BKA without any erythema or drainage.         Data Reviewed: I have personally reviewed following labs and imaging studies Basic Metabolic Panel:   Recent Labs  Lab 10/22/19 1520 10/23/19 0359  NA 125* 132*  K 3.4* 3.3*  CL 89* 98  CO2 21* 22  GLUCOSE 389* 266*  BUN 40* 31*  CREATININE 1.42* 0.97  CALCIUM 8.8* 8.5*  MG 1.5* 1.7   Liver Function Tests: Recent Labs  Lab 10/22/19 1520 10/23/19 0359  AST 28 25  ALT 30 28  ALKPHOS 123 83  BILITOT 2.0* 1.6*  PROT 7.5 6.0*  ALBUMIN 3.4* 2.6*   No results for input(s): LIPASE, AMYLASE in the last 168 hours. No results for  input(s): AMMONIA in the last 168 hours. Coagulation Profile: Recent Labs  Lab 10/22/19 1520  INR 1.3*   CBC: Recent Labs  Lab 10/22/19 1520 10/23/19 0359  WBC 12.1* 7.7  NEUTROABS 10.2* 6.3  HGB 16.0 13.8  HCT 46.1 39.0  MCV 88.0 87.8  PLT 176 123*   Cardiac Enzymes: No results for input(s): CKTOTAL, CKMB, CKMBINDEX, TROPONINI in the last 168 hours. BNP: Invalid input(s): POCBNP CBG: Recent Labs  Lab 10/22/19 1448 10/22/19 2128  GLUCAP 395* 278*   HbA1C: No results for input(s): HGBA1C in the last 72 hours. Urine analysis:    Component Value Date/Time   COLORURINE YELLOW 10/22/2019 1508   APPEARANCEUR HAZY (A) 10/22/2019 1508   LABSPEC 1.017 10/22/2019 1508   PHURINE 5.0 10/22/2019 1508   GLUCOSEU >=500 (A) 10/22/2019 1508   HGBUR SMALL (A) 10/22/2019 1508   BILIRUBINUR NEGATIVE 10/22/2019 1508   BILIRUBINUR negative 03/26/2014 1212   KETONESUR 5 (A) 10/22/2019 1508   PROTEINUR NEGATIVE 10/22/2019 1508   UROBILINOGEN 0.2 06/13/2014 1048   NITRITE NEGATIVE 10/22/2019 1508   LEUKOCYTESUR NEGATIVE 10/22/2019 1508   Sepsis Labs: '@LABRCNTIP'$ (procalcitonin:4,lacticidven:4) ) Recent Results (from the past 240 hour(s))  Blood Culture (routine x 2)     Status: None (Preliminary result)   Collection Time: 10/22/19  3:20 PM  Specimen: BLOOD LEFT WRIST  Result Value Ref Range Status   Specimen Description BLOOD LEFT WRIST  Final   Special Requests   Final    BOTTLES DRAWN AEROBIC AND ANAEROBIC Blood Culture adequate volume   Culture  Setup Time   Final    GRAM POSITIVE COCCI BOTH ANAEROBIC AND AEROBIC BOTTLES Gram Stain Report Called to,Read Back By and Verified With: HEARN,J'@0705'$  BY MATTHEWS, B 12.17.2020 [facility] Performed at [facility], [address], [place], [place] [address]    Culture PENDING  Incomplete   Report Status PENDING  Incomplete  Blood Culture (routine x 2)     Status: None (Preliminary result)   Collection Time: 10/22/19  3:31 PM    Specimen: Right Antecubital; Blood  Result Value Ref Range Status   Specimen Description RIGHT ANTECUBITAL  Final   Special Requests   Final    BOTTLES DRAWN AEROBIC AND ANAEROBIC Blood Culture adequate volume   Culture  Setup Time   Final    GRAM POSITIVE COCCI ANAEROBIC AND AEROBIC BOTTLES Gram Stain Report Called to,Read Back By and Verified With: HEARN,J'@0705'$  BY MATTHEWS B 12.17.2020  [facility] Performed at [facility], [address], [place], [place] [address]    Culture PENDING  Incomplete   Report Status PENDING  Incomplete  SARS CORONAVIRUS 2 (Ndeye Tenorio 6-24 HRS) Nasopharyngeal Nasopharyngeal Swab     Status: None   Collection Time: 10/22/19  4:14 PM   Specimen: Nasopharyngeal Swab  Result Value Ref Range Status   SARS Coronavirus 2 NEGATIVE NEGATIVE Final    Comment: (NOTE) SARS-CoV-2 target nucleic acids are NOT DETECTED. The SARS-CoV-2 RNA is generally detectable in upper and lower respiratory specimens during the acute phase of infection. Negative results do not preclude SARS-CoV-2 infection, do not rule out co-infections with other pathogens, and should not be used as the sole basis for treatment or other patient management decisions. Negative results must be combined with clinical observations, patient history, and epidemiological information. The expected result is Negative. Fact Sheet for Patients: SugarRoll.be Fact Sheet for Healthcare Providers: https://www.woods-mathews.com/ This test is not yet approved or cleared by the [place] FDA and  has been authorized for detection and/or diagnosis of SARS-CoV-2 by FDA under an Emergency Use Authorization (EUA). This EUA will remain  in effect (meaning this test can be used) for the duration of the COVID-19 declaration under Section 56 4(b)(1) of the Act, 21 U.S.C. section 360bbb-3(b)(1), unless the authorization is terminated or revoked sooner. Performed at [facility], [place] [address], [place], [place] [address]   MRSA PCR Screening     Status: None   Collection Time: 10/22/19  8:11 PM   Specimen: Nasal Mucosa; Nasopharyngeal  Result Value Ref Range Status   MRSA by PCR NEGATIVE NEGATIVE Final    Comment:        The GeneXpert MRSA Assay (FDA approved for NASAL specimens only), is one component of a comprehensive MRSA colonization surveillance program. It is not intended to diagnose MRSA infection nor to guide or monitor treatment for MRSA infections. Performed at [facility], [address], [place],  [address]   C difficile quick scan w PCR reflex     Status: None   Collection Time: 10/22/19 11:56 PM   Specimen: STOOL  Result Value Ref Range Status   C Diff antigen NEGATIVE NEGATIVE Final   C Diff toxin NEGATIVE NEGATIVE Final   C Diff interpretation No C. difficile detected.  Final  Comment: Performed at [facility], [address], [place], [place] [address]     Scheduled Meds: . Chlorhexidine Gluconate Cloth  6 each Topical Q0600  . insulin aspart  0-15 Units Subcutaneous TID WC  . insulin aspart  0-5 Units Subcutaneous QHS  . insulin glargine  20 Units Subcutaneous QHS  . sodium chloride flush  3 mL Intravenous Q12H   Continuous Infusions: . sodium chloride Stopped (10/23/19 0357)  . diltiazem (CARDIZEM) infusion Stopped (10/23/19 0357)  . piperacillin-tazobactam (ZOSYN)  IV 3.375 g (10/23/19 0631)  . vancomycin Stopped (10/23/19 0342)    Procedures/Studies: DG Tibia/Fibula Right  Result Date: 10/22/2019 CLINICAL DATA:  Right leg pain and tenderness. Fever. Clinical evidence of infection. Diabetes. EXAM: RIGHT TIBIA AND FIBULA - 2 VIEW COMPARISON:  Right ankle radiographs obtained at the same time. FINDINGS: Mild diffuse soft tissue swelling. Previously described chronic appearing periosteal reaction involving the distal tibia. On these radiographs, this is involving the medial and posterior aspects of the  tibia. Also again noted are previously described bone collapse and fragmentation involving the talotibial joint and flattening of the normal plantar arch. No soft tissue gas is seen. Mild medial and lateral spur formation at the knee joint with mild to moderate medial joint space narrowing. IMPRESSION: 1. Chronic appearing periosteal reaction involving the posterior and medial aspects of the distal tibia. Underlying chronic osteomyelitis is a possibility. 2. Diffuse soft tissue swelling without gas or bone destruction. 3. 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 Electronically Signed   By: Claudie Revering M.D.   On: 10/22/2019 16:05   DG Ankle Complete Right  Result Date: 10/22/2019 CLINICAL DATA:  Right leg pain and tenderness and fever with clinical evidence of infection. Diabetes. EXAM: RIGHT ANKLE - COMPLETE 3+ VIEW COMPARISON:  Right foot radiographs obtained today and on 06/19/2016. FINDINGS: Diffuse soft tissue swelling. Bone collapse and fragmentation at the talotibial joint, described on the foot radiographs, in addition to previously described flattening of the normal plantar arch. Again demonstrated are is chronic appearing posterior periosteal reaction involving the distal tibia. No bone destruction or soft tissue gas seen. IMPRESSION: 1. Diffuse soft tissue swelling without underlying changes of acute osteomyelitis. 2. Neuropathic changes at the talotibial joint. 3. Pes planus. Electronically Signed   By: Claudie Revering M.D.   On: 10/22/2019 16:02   DG Chest Port 1 View  Result Date: 10/22/2019 CLINICAL DATA:  Fever EXAM: PORTABLE CHEST 1 VIEW COMPARISON:  12/09/2013 chest radiograph. FINDINGS: Stable cardiomediastinal silhouette with top-normal heart size. No pneumothorax. No pleural effusion. Lungs appear clear, with no acute consolidative airspace disease and no pulmonary edema. IMPRESSION: No active disease. Electronically Signed   By: Ilona Sorrel M.D.   On: 10/22/2019  15:55   DG Foot Complete Right  Result Date: 10/22/2019 CLINICAL DATA:  Right foot pain and fever. Diabetes. Clinical infection. EXAM: RIGHT FOOT COMPLETE - 3+ VIEW COMPARISON:  06/19/2016. FINDINGS: Diffuse soft tissue swelling. Again demonstrated surgical absence of the right great toe with interval surgical absence of the right little toe. Well-defined bone margins. No periosteal reaction or bone destruction involving the bones of the foot. There is flattening of the normal plantar arch and interval partial bone collapse and fragmentation involving the distal tibia and talus. There is also chronic appearing periosteal thickening and irregularity involving the distal tibia posteriorly. IMPRESSION: 1. Diffuse soft tissue swelling without soft tissue gas or evidence of underlying osteomyelitis. 2. Pes planus. 3. Neuropathic changes at the  talotibial joint with bone collapse and fragmentation. Electronically Signed   By: Claudie Revering M.D.   On: 10/22/2019 15:59    Orson Eva, DO  [facility] Pager [phone]  If 7PM-7AM, please contact night-coverage [email or web address] Password [email or web address] 10/23/2019, 7:24 AM   LOS: 1 day

## 2019-10-23 NOTE — Progress Notes (Signed)
ANTICOAGULATION CONSULT NOTE -    Pharmacy Consult for heparin Indication: atrial fibrillation  No Known Allergies  Patient Measurements: Height: 6\' 4"  (193 cm) Weight: 206 lb 12.7 oz (93.8 kg) IBW/kg (Calculated) : 86.8 Heparin Dosing Weight: 94 kg  Vital Signs: Temp: 99 F (37.2 C) (12/17 1300) Temp Source: Oral (12/17 1300) BP: 111/69 (12/17 1430) Pulse Rate: 99 (12/17 1430)  Labs: Recent Labs    10/22/19 1520 10/23/19 0359 10/23/19 1437  HGB 16.0 13.8  --   HCT 46.1 39.0  --   PLT 176 123*  --   APTT 30  --   --   LABPROT 15.9*  --   --   INR 1.3*  --   --   HEPARINUNFRC  --   --  <0.10*  CREATININE 1.42* 0.97  --     Estimated Creatinine Clearance: 105.6 mL/min (by C-G formula based on SCr of 0.97 mg/dL).   Medical History: Past Medical History:  Diagnosis Date  . Charcot ankle   . Hypertension   . Osteomyelitis of toe of right foot (HCC)   . Recurrent boils   . Type 2 diabetes mellitus with diabetic neuropathy [facility])    Diagnosed age 22    Medications:  Medications Prior to Admission  Medication Sig Dispense Refill Last Dose  . atorvastatin (LIPITOR) 20 MG tablet Take 1 tablet (20 mg total) by mouth daily. 90 tablet 3   . doxycycline (VIBRA-TABS) 100 MG tablet Take 100 mg by mouth 2 (two) times daily. Started course on 10/10/2019     . gabapentin (NEURONTIN) 600 MG tablet Take 1 tablet (600 mg total) by mouth at bedtime. 90 tablet 3   . Insulin Glargine, 2 Unit Dial, (TOUJEO MAX SOLOSTAR) 300 UNIT/ML SOPN Inject 40-50 Units into the skin daily. 5 pen 5   . lisinopril (PRINIVIL,ZESTRIL) 5 MG tablet Take 1 tablet (5 mg total) by mouth daily. 90 tablet 3   . metFORMIN (GLUCOPHAGE) 1000 MG tablet Take 1 tablet (1,000 mg total) by mouth 2 (two) times daily. 180 tablet 3 10/22/2019  . albuterol (PROVENTIL HFA;VENTOLIN HFA) 108 (90 Base) MCG/ACT inhaler Inhale 2 puffs into the lungs every 6 (six) hours as needed for wheezing or shortness of breath. 1 Inhaler 0    . aspirin (ASPIRIN EC) 81 MG EC tablet Take 81 mg by mouth daily. Swallow whole.     . Continuous Blood Gluc Receiver (FREESTYLE LIBRE 14 DAY READER) DEVI 1 each by Does not apply route daily. (Patient not taking: Reported on 10/22/2019) 1 each 0 Not Taking at Unknown time  . Continuous Blood Gluc Sensor (FREESTYLE LIBRE 14 DAY SENSOR) MISC 1 each by Other route every 14 (fourteen) days. (Patient not taking: Reported on 10/22/2019) 6 each 3 Not Taking at Unknown time  . cyclobenzaprine (FLEXERIL) 10 MG tablet Take 1 tablet (10 mg total) by mouth at bedtime as needed for muscle spasms. 30 tablet 3   . DULoxetine (CYMBALTA) 60 MG capsule Take 1 capsule (60 mg total) by mouth daily. 90 capsule 3   . fluticasone (FLONASE) 50 MCG/ACT nasal spray Place 1 spray into both nostrils 2 (two) times daily as needed for allergies or rhinitis. 16 g 6   . HYDROcodone-acetaminophen (NORCO) 10-325 MG tablet Take 1 tablet by mouth 2 (two) times daily as needed. Do not refill until 60 days from prescription date 60 tablet 0   . HYDROcodone-acetaminophen (NORCO) 10-325 MG tablet Take 1 tablet by mouth 2 (two) times  daily as needed. Do not refill until 30 days from prescription date (Patient not taking: Reported on 10/22/2019) 60 tablet 0 Not Taking at Unknown time  . HYDROcodone-acetaminophen (NORCO) 10-325 MG tablet Take 1 tablet by mouth 2 (two) times daily as needed for moderate pain. (Patient not taking: Reported on 10/22/2019) 60 tablet 0 Not Taking at Unknown time  . ibuprofen (ADVIL,MOTRIN) 200 MG tablet Take 200 mg by mouth every 6 (six) hours as needed for mild pain or moderate pain.      . Multiple Vitamins-Minerals (MULTIVITAMIN WITH MINERALS) tablet Take 1 tablet by mouth daily.     Marland Kitchen terbinafine (LAMISIL AT) 1 % cream Apply 1 application topically 2 (two) times daily. (Patient not taking: Reported on 07/22/2019) 42 g 0   . zinc gluconate 50 MG tablet Take 50 mg by mouth daily.       Assessment: Pharmacy  consulted to dose heparin in patient with atrial fibrillation.  Patient is not on anticoagulation prior to admission.  HL <0.10  Goal of Therapy:  Heparin level 0.3-0.7 units/ml Monitor platelets by anticoagulation protocol: Yes    Plan:  Rebolus 2800 units x 1 Increase heparin infusion to 1700 units/hr Check anti-Xa level in 6 hours and daily while on heparin. Continue to monitor H&H and platelets.   Margot Ables, PharmD Clinical Pharmacist 10/23/2019 3:12 PM

## 2019-10-23 NOTE — Consult Note (Signed)
Podiatry Consult Note  Reason for Consultation:  Ulcer R 3rd toe with infection  History of Present Illness: [name] is a 53 y.o. male with a history of poorly controlled DM, peripheral neuropathy, Charcot arthropathy, numerous diabetic foot ulcers and BKA of LLE.  He presented with cellulitis and an ulcer of the right third toe on 10/10/2019.  He was afebrile with blood pressure of 142/82.  Radiographs were performed.  No changes present to suggest osteomyelitis.  The ulcer was debrided and a culture was performed.  He was empirically placed on doxycycline.  Cultures revealed Staphylococcus aureus sensitive to oxacillin and tetracycline.  He returned on 10/17/2019 with clinical improvement in infection.  He was instructed to continue local wound, complete the course of doxycycline and return on 10/24/2019.  He called me yesterday morning complaining of redness extending to ankle, nausea, vomiting, diarrhea and weakness.  He stated that he had been doing well until Tuesday (10/21/2019).  I instructed him to go to ED at [facility] for evaluation.    He relates that he feels some better today.   Past Medical History:  Diagnosis Date  . Charcot ankle   . Hypertension   . Osteomyelitis of toe of right foot (HCC)   . Recurrent boils   . Type 2 diabetes mellitus with diabetic neuropathy [facility])    Diagnosed age 2   Scheduled Meds: . Chlorhexidine Gluconate Cloth  6 each Topical Q0600  . insulin aspart  0-15 Units Subcutaneous TID WC  . insulin aspart  0-5 Units Subcutaneous QHS  . insulin glargine  20 Units Subcutaneous QHS  . ondansetron (ZOFRAN) IV  4 mg Intravenous Q6H  . sodium chloride flush  3 mL Intravenous Q12H   Continuous Infusions: . amiodarone 60 mg/hr (10/23/19 0921)  . amiodarone    . diltiazem (CARDIZEM) infusion 12.5 mg/hr (10/23/19 0830)  . heparin 1,400 Units/hr (10/23/19 0827)  . lactated ringers with KCl 20 mEq/L    . potassium chloride 10 mEq (10/23/19 0920)  .  vancomycin     PRN Meds:.acetaminophen **OR** acetaminophen, fentaNYL (SUBLIMAZE) injection, fluticasone, promethazine  No Known Allergies Past Surgical History:  Procedure Laterality Date  . AMPUTATION Right 06/17/2014   Procedure: PARTIAL AMPUTATION RIGHT 3RD TOE;  Surgeon: [name], DPM;  Location: [facility];  Service: Podiatry;  Laterality: Right;  . AMPUTATION Right 09/02/2014   Procedure: PARTIAL AMPUTATION 2ND TOE RIGHT FOOT;  Surgeon: [name], DPM;  Location: [facility];  Service: Podiatry;  Laterality: Right;  . AMPUTATION Right 04/01/2015   Procedure: AMPUTATION DIGIT 1ST TOE RIGHT FOOT;  Surgeon: Laurell Josephs, DPM;  Location: [facility];  Service: Podiatry;  Laterality: Right;  . AMPUTATION Right 06/29/2016   pinkeye   . FOOT AMPUTATION Left   . FOOT SURGERY Left 03/2017  . HERNIA REPAIR Bilateral    and 3rd hernia repair does not remember ehat side   Family History  Problem Relation Age of Onset  . Stroke Father   . Asthma Father   . COPD Father   . Aneurysm Father        AAA  . Diabetes Brother   . Stroke Brother        2008  . Diabetes Mother    Social History:  reports that he has never smoked. He has never used smokeless tobacco. He reports that he does not drink alcohol or use drugs.  Review of Systems: Significant for diarrhea, weakness, ulcer of right third toe,  redness of RLE and edema of RLE.  Physical Examination: Vital signs in last 24 hours:   Temp:  [98 F (36.7 C)-100.6 F (38.1 C)] 99.5 F (37.5 C) (12/17 0722) Pulse Rate:  [66-145] 125 (12/17 0930) Resp:  [19-39] 19 (12/17 0930) BP: (86-131)/(54-93) 109/72 (12/17 0930) SpO2:  [92 %-98 %] 96 % (12/17 0930) Weight:  [93.8 kg-97.5 kg] 93.8 kg (12/17 0500)  INTEGUMENT:  Ulcer distal R 3rd toe measuring 0.7 x 0.5 x 0.5 cm.  Wound is fibrotic.  Erythema of R 3rd toe extending to ankle with red streaks up leg.  Ulcer probes to bone.  Purulence present. VASCULAR:  Pedal pulses are  palpable.  Edema of RLE. MUSCULOSKELETAL:  Status post BKA LLE.  Status post amputation of R hallux.  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   NEUROLOGIC:  Protective sensation of RLE diminished to lower leg.  Lab/Test Results:   Recent Labs    10/22/19 1520 10/23/19 0359  WBC 12.1* 7.7  HGB 16.0 13.8  HCT 46.1 39.0  PLT 176 123*  NA 125* 132*  K 3.4* 3.3*  CL 89* 98  CO2 21* 22  BUN 40* 31*  CREATININE 1.42* 0.97  GLUCOSE 389* 266*  CALCIUM 8.8* 8.5*    Recent Results (from the past 240 hour(s))  Blood Culture (routine x 2)     Status: None (Preliminary result)   Collection Time: 10/22/19  3:20 PM   Specimen: BLOOD LEFT WRIST  Result Value Ref Range Status   Specimen Description BLOOD LEFT WRIST  Final   Special Requests   Final    BOTTLES DRAWN AEROBIC AND ANAEROBIC Blood Culture adequate volume   Culture  Setup Time   Final    GRAM POSITIVE COCCI BOTH ANAEROBIC AND AEROBIC BOTTLES Gram Stain Report Called to,Read Back By and Verified With: HEARN,[email or web address]  BY MATTHEWS, B 12.17.2020 [facility]    Culture   Final    NO GROWTH < 24 HOURS Performed at [facility], [address] [place], [place] [address]    Report Status PENDING  Incomplete  Blood Culture (routine x 2)     Status: None (Preliminary result)   Collection Time: 10/22/19  3:31 PM   Specimen: Right Antecubital; Blood  Result Value Ref Range Status   Specimen Description RIGHT ANTECUBITAL  Final   Special Requests   Final    BOTTLES DRAWN AEROBIC AND ANAEROBIC Blood Culture adequate volume   Culture  Setup Time   Final    GRAM POSITIVE COCCI ANAEROBIC AND AEROBIC BOTTLES Gram Stain Report Called to,Read Back By and Verified With: HEARN,[email or web address]  BY MATTHEWS B 12.17.2020 [facility]    Culture   Final    NO GROWTH < 24 HOURS Performed at [facility], [address], [place], [place] [address]    Report Status PENDING  Incomplete  SARS CORONAVIRUS 2 (TAT 6-24 HRS) Nasopharyngeal Nasopharyngeal  Swab     Status: None   Collection Time: 10/22/19  4:14 PM   Specimen: Nasopharyngeal Swab  Result Value Ref Range Status   SARS Coronavirus 2 NEGATIVE NEGATIVE Final    Comment: (NOTE) SARS-CoV-2 target nucleic acids are NOT DETECTED. The SARS-CoV-2 RNA is generally detectable in upper and lower respiratory specimens during the acute phase of infection. Negative results do not preclude SARS-CoV-2 infection, do not rule out co-infections with other pathogens, and should not be used as the sole basis for treatment or other patient management decisions. Negative results must  be combined with clinical observations, patient history, and epidemiological information. The expected result is Negative. Fact Sheet for Patients: HairSlick.nohttps://www.fda.gov/media/138098/download Fact Sheet for Healthcare Providers: quierodirigir.comhttps://www.fda.gov/media/138095/download This test is not yet approved or cleared by the [place] FDA and  has been authorized for detection and/or diagnosis of SARS-CoV-2 by FDA under an Emergency Use Authorization (EUA). This EUA will remain  in effect (meaning this test can be used) for the duration of the COVID-19 declaration under Section 56 4(b)(1) of the Act, 21 U.S.C. section 360bbb-3(b)(1), unless the authorization is terminated or revoked sooner. Performed at [facility], [address] [place], [place] [address]   MRSA PCR Screening     Status: None   Collection Time: 10/22/19  8:11 PM   Specimen: Nasal Mucosa; Nasopharyngeal  Result Value Ref Range Status   MRSA by PCR NEGATIVE NEGATIVE Final    Comment:        The GeneXpert MRSA Assay (FDA approved for NASAL specimens only), is one component of a comprehensive MRSA colonization surveillance program. It is not intended to diagnose MRSA infection nor to guide or monitor treatment for MRSA infections. Performed at [facility], [address] [place], [place] [address]   C difficile quick scan w PCR reflex      Status: None   Collection Time: 10/22/19 11:56 PM   Specimen: STOOL  Result Value Ref Range Status   C Diff antigen NEGATIVE NEGATIVE Final   C Diff toxin NEGATIVE NEGATIVE Final   C Diff interpretation No C. difficile detected.  Final    Comment: Performed at [facility], [address] [place], [place] [address]     DG Tibia/Fibula Right  Result Date: 10/22/2019 CLINICAL DATA:  Right leg pain and tenderness. Fever. Clinical evidence of infection. Diabetes. EXAM: RIGHT TIBIA AND FIBULA - 2 VIEW COMPARISON:  Right ankle radiographs obtained at the same time. FINDINGS: Mild diffuse soft tissue swelling. Previously described chronic appearing periosteal reaction involving the distal tibia. On these radiographs, this is involving the medial and posterior aspects of the tibia. Also again noted are previously described bone collapse and fragmentation involving the talotibial joint and flattening of the normal plantar arch. No soft tissue gas is seen. Mild medial and lateral spur formation at the knee joint with mild to moderate medial joint space narrowing. IMPRESSION: 1. Chronic appearing periosteal reaction involving the posterior and medial aspects of the distal tibia. Underlying chronic osteomyelitis is a possibility. 2. Diffuse soft tissue swelling without gas or bone destruction. 3. 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 Electronically Signed   By: Beckie SaltsSteven  Reid M.D.   On: 10/22/2019 16:05   DG Ankle Complete Right  Result Date: 10/22/2019 CLINICAL DATA:  Right leg pain and tenderness and fever with clinical evidence of infection. Diabetes. EXAM: RIGHT ANKLE - COMPLETE 3+ VIEW COMPARISON:  Right foot radiographs obtained today and on 06/19/2016. FINDINGS: Diffuse soft tissue swelling. Bone collapse and fragmentation at the talotibial joint, described on the foot radiographs, in addition to previously described flattening of the normal plantar arch. Again demonstrated are  is chronic appearing posterior periosteal reaction involving the distal tibia. No bone destruction or soft tissue gas seen. IMPRESSION: 1. Diffuse soft tissue swelling without underlying changes of acute osteomyelitis. 2. Neuropathic changes at the talotibial joint. 3. Pes planus. Electronically Signed   By: Beckie SaltsSteven  Reid M.D.   On: 10/22/2019 16:02   DG Chest Port 1 View  Result Date: 10/22/2019 CLINICAL DATA:  Fever EXAM: PORTABLE CHEST 1 VIEW COMPARISON:  12/09/2013 chest radiograph. FINDINGS: Stable cardiomediastinal silhouette with top-normal heart size. No pneumothorax. No pleural effusion. Lungs appear clear, with no acute consolidative airspace disease and no pulmonary edema. IMPRESSION: No active disease. Electronically Signed   By: Delbert Phenix M.D.   On: 10/22/2019 15:55   DG Foot Complete Right  Result Date: 10/22/2019 CLINICAL DATA:  Right foot pain and fever. Diabetes. Clinical infection. EXAM: RIGHT FOOT COMPLETE - 3+ VIEW COMPARISON:  06/19/2016. FINDINGS: Diffuse soft tissue swelling. Again demonstrated surgical absence of the right great toe with interval surgical absence of the right little toe. Well-defined bone margins. No periosteal reaction or bone destruction involving the bones of the foot. There is flattening of the normal plantar arch and interval partial bone collapse and fragmentation involving the distal tibia and talus. There is also chronic appearing periosteal thickening and irregularity involving the distal tibia posteriorly. IMPRESSION: 1. Diffuse soft tissue swelling without soft tissue gas or evidence of underlying osteomyelitis. 2. Pes planus. 3. 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 Electronically Signed   By: Beckie Salts M.D.   On: 10/22/2019 15:59   ECHOCARDIOGRAM COMPLETE  Result Date: 10/23/2019   ECHOCARDIOGRAM REPORT   Patient Name:   [name] Date of Exam: 10/23/2019 Medical Rec #:  [id number]     Height:        76.0 in Accession #:    [id number]    Weight:       206.8 lb Date of Birth:  Oct 19, 1966    BSA:          2.25 m Patient Age:    53 years      BP:           99/54 mmHg Patient Gender: M             HR:           120 bpm. [facility] Procedure: 2D Echo, Cardiac Doppler and Color Doppler Indications:    Atrial Fibrillation 427.31 / I48.91  History:        Patient has no prior history of Echocardiogram examinations.                 Arrythmias:Atrial Fibrillation; Risk Factors:Diabetes and                 Hypertension.  Sonographer:    Celesta Gentile RCS Referring Phys: [phone] DAVID TAT IMPRESSIONS  1. Left ventricular ejection fraction, by visual estimation, is 70 to 75%. The left ventricle has hyperdynamic function. There is mildly increased left ventricular hypertrophy.  2. Left ventricular diastolic parameters are indeterminate.  3. The left ventricle has no regional wall motion abnormalities.  4. Global right ventricle has normal systolic function.The right ventricular size is normal. No increase in right ventricular wall thickness.  5. Left atrial size was normal.  6. Right atrial size was normal.  7. The mitral valve is grossly normal. Trivial mitral valve regurgitation.  8. The tricuspid valve is grossly normal. Tricuspid valve regurgitation is trivial.  9. The aortic valve is tricuspid. Aortic valve regurgitation is not visualized. 10. The pulmonic valve was grossly normal. Pulmonic valve regurgitation is not visualized. 11. Mildly elevated pulmonary artery systolic pressure. 12. The tricuspid regurgitant velocity is 3.04 m/s, and with an assumed right atrial pressure of 3 mmHg, the estimated right ventricular systolic pressure is mildly elevated at 40.0 mmHg. 13. The inferior  vena cava is normal in size with greater than 50% respiratory variability, suggesting right atrial pressure of 3 mmHg. FINDINGS  Left Ventricle: Left ventricular ejection fraction, by visual estimation, is 70 to 75%. The  left ventricle has hyperdynamic function. The left ventricle has no regional wall motion abnormalities. The left ventricular internal cavity size was the left ventricle is normal in size. There is mildly increased left ventricular hypertrophy. Septal left ventricular hypertrophy. Left ventricular diastolic parameters are indeterminate. Right Ventricle: The right ventricular size is normal. No increase in right ventricular wall thickness. Global RV systolic function is has normal systolic function. The tricuspid regurgitant velocity is 3.04 m/s, and with an assumed right atrial pressure  of 3 mmHg, the estimated right ventricular systolic pressure is mildly elevated at 40.0 mmHg. Left Atrium: Left atrial size was normal in size. Right Atrium: Right atrial size was normal in size Pericardium: There is no evidence of pericardial effusion. Mitral Valve: The mitral valve is grossly normal. Trivial mitral valve regurgitation. Tricuspid Valve: The tricuspid valve is grossly normal. Tricuspid valve regurgitation is trivial. Aortic Valve: The aortic valve is tricuspid. Aortic valve regurgitation is not visualized. Pulmonic Valve: The pulmonic valve was grossly normal. Pulmonic valve regurgitation is not visualized. Pulmonic regurgitation is not visualized. Aorta: The aortic root is normal in size and structure. Venous: The inferior vena cava is normal in size with greater than 50% respiratory variability, suggesting right atrial pressure of 3 mmHg. IAS/Shunts: No atrial level shunt detected by color flow Doppler.  LEFT VENTRICLE PLAX 2D LVIDd:         4.44 cm LVIDs:         2.56 cm LV PW:         1.00 cm LV IVS:        1.26 cm LVOT diam:     2.00 cm LV SV:         66 ml LV SV Index:   29.33 LVOT Area:     3.14 cm  RIGHT VENTRICLE TAPSE (M-mode): 1.7 cm LEFT ATRIUM             Index       RIGHT ATRIUM           Index LA diam:        3.30 cm 1.47 cm/m  RA Area:     20.60 cm LA Vol (A2C):   52.0 ml 23.14 ml/m RA Volume:    57.20 ml  25.46 ml/m LA Vol (A4C):   40.9 ml 18.20 ml/m LA Biplane Vol: 47.2 ml 21.01 ml/m  AORTIC VALVE LVOT Vmax:   129.00 cm/s LVOT Vmean:  82.933 cm/s LVOT VTI:    0.185 m  AORTA Ao Root diam: 3.30 cm MITRAL VALVE                        TRICUSPID VALVE MV Area (PHT): 3.21 cm             TR Peak grad:   37.0 mmHg MV PHT:        68.44 msec           TR Vmax:        304.00 cm/s MV Decel Time: 236 msec MV E velocity: 107.00 cm/s 103 cm/s SHUNTS  Systemic VTI:  0.19 m                                     Systemic Diam: 2.00 cm  Rozann Lesches MD Electronically signed by Rozann Lesches MD Signature Date/Time: 10/23/2019/9:39:01 AM    Final     Assessment: 1.  Sepsis 2.  Osteomyelitis of R 3rd toe 3.  Cellulitis of R foot 4.  Charcot arthropathy, R foot/ankle 5.  Diabetes mellitus with peripheral neuropathy  Plan: Continue vancomycin per IM. Obtain MRI R foot to evaluation. Apply Betadine dressing to R 3rd toe for now. Proceed with amputation of R 3rd toe/partial 3rd ray ampuation tomorrow if okay with IM. Obtain informed consent. NPO after midnight.  Brita Romp 10/23/2019, 9:44 AM

## 2019-10-23 NOTE — Consult Note (Addendum)
Cardiology Consultation:   Patient ID: [name]; [id number]; 1966-09-05   Admit date: 10/22/2019 Date of Consult: 10/23/2019  Primary Care Provider: Dettinger, Elige Radon, MD Consulting Cardiologist: Nona Dell, MD (New) Primary Electrophysiologist:  None   Patient Profile:   [name] is a 53 y.o. male with a history of poorly controlled DM, HTN, periph neuropathy, diabetic foot ulcers s/p L BKA, Charcot foot deformity, who was admitted 12/16 with LE cellulitis, sepsis and rapid Afib. He is being seen today for the evaluation of atrial fib and preop surgical risk at the request of Dr Tat.  History of Present Illness:   [name] generally does well. He wears a brace on his foot. With that in place, he is able to walk and climb stairs.  He never gets chest pain or shortness of breath with exertion.  He does not get lower extremity edema, denies orthopnea or PND.  Starting Tuesday night, he began feeling bad.  He was weak and lightheaded at times.  He was not really aware of any fevers.  His right foot was hurting.  There was some drainage as well.  He was also having problems with nausea and vomiting.  He notes that he would vomit if he laid down, but if he sat up he did much better.  He has been sitting up to keep from vomiting, and has not gotten a lot of sleep.  He had no awareness of the atrial fibrillation.  He has not had any palpitations and knows that his heart rate was 180 only because he was told.  He does not know when the atrial fibrillation started, only that he started feeling bad about 36 hours ago.  The diltiazem has been titrated up as his blood pressure would allow, but he cannot tell any difference in his heart rate.  However, since getting fluid resuscitated and various medications to treat the infection as well as symptoms, he generally does feel better.   Past Medical History:  Diagnosis Date  . Charcot ankle   . Hypertension   . Osteomyelitis of  toe of right foot (HCC)   . Recurrent boils   . Type 2 diabetes mellitus with diabetic neuropathy [facility])    Diagnosed age 74    Past Surgical History:  Procedure Laterality Date  . AMPUTATION Right 06/17/2014   Procedure: PARTIAL AMPUTATION RIGHT 3RD TOE;  Surgeon: [name], DPM;  Location: [facility];  Service: Podiatry;  Laterality: Right;  . AMPUTATION Right 09/02/2014   Procedure: PARTIAL AMPUTATION 2ND TOE RIGHT FOOT;  Surgeon: [name], DPM;  Location: [facility];  Service: Podiatry;  Laterality: Right;  . AMPUTATION Right 04/01/2015   Procedure: AMPUTATION DIGIT 1ST TOE RIGHT FOOT;  Surgeon: Laurell Josephs, DPM;  Location: [facility];  Service: Podiatry;  Laterality: Right;  . AMPUTATION Right 06/29/2016   pinkeye   . FOOT AMPUTATION Left   . FOOT SURGERY Left 03/2017  . HERNIA REPAIR Bilateral    and 3rd hernia repair does not remember ehat side     Prior to Admission medications   Medication Sig Start Date End Date Taking? Authorizing Provider  atorvastatin (LIPITOR) 20 MG tablet Take 1 tablet (20 mg total) by mouth daily. 07/22/19  Yes Dettinger, Elige Radon, MD  doxycycline (VIBRA-TABS) 100 MG tablet Take 100 mg by mouth 2 (two) times daily. Started course on 10/10/2019   Yes [provider]  gabapentin (NEURONTIN) 600 MG tablet Take 1 tablet (600 mg  total) by mouth at bedtime. 07/22/19  Yes Dettinger, Elige Radon, MD  Insulin Glargine, 2 Unit Dial, (TOUJEO MAX SOLOSTAR) 300 UNIT/ML SOPN Inject 40-50 Units into the skin daily. 04/18/19  Yes Dettinger, Elige Radon, MD  lisinopril (PRINIVIL,ZESTRIL) 5 MG tablet Take 1 tablet (5 mg total) by mouth daily. 11/18/18  Yes Dettinger, Elige Radon, MD  metFORMIN (GLUCOPHAGE) 1000 MG tablet Take 1 tablet (1,000 mg total) by mouth 2 (two) times daily. 11/18/18  Yes Dettinger, Elige Radon, MD  albuterol (PROVENTIL HFA;VENTOLIN HFA) 108 (90 Base) MCG/ACT inhaler Inhale 2 puffs into the lungs every 6 (six) hours as needed for wheezing or  shortness of breath. 01/08/19   Dettinger, Elige Radon, MD  aspirin (ASPIRIN EC) 81 MG EC tablet Take 81 mg by mouth daily. Swallow whole.    [provider]  Continuous Blood Gluc Receiver (FREESTYLE LIBRE 14 DAY READER) DEVI 1 each by Does not apply route daily. Patient not taking: Reported on 10/22/2019 10/14/19   Dettinger, Elige Radon, MD  Continuous Blood Gluc Sensor (FREESTYLE LIBRE 14 DAY SENSOR) MISC 1 each by Other route every 14 (fourteen) days. Patient not taking: Reported on 10/22/2019 10/14/19   Dettinger, Elige Radon, MD  cyclobenzaprine (FLEXERIL) 10 MG tablet Take 1 tablet (10 mg total) by mouth at bedtime as needed for muscle spasms. 12/18/18   Dettinger, Elige Radon, MD  DULoxetine (CYMBALTA) 60 MG capsule Take 1 capsule (60 mg total) by mouth daily. 12/18/18   Dettinger, Elige Radon, MD  fluticasone (FLONASE) 50 MCG/ACT nasal spray Place 1 spray into both nostrils 2 (two) times daily as needed for allergies or rhinitis. 01/08/19   Dettinger, Elige Radon, MD  HYDROcodone-acetaminophen (NORCO) 10-325 MG tablet Take 1 tablet by mouth 2 (two) times daily as needed. Do not refill until 60 days from prescription date 09/19/19   Dettinger, Elige Radon, MD  HYDROcodone-acetaminophen [facility]) 10-325 MG tablet Take 1 tablet by mouth 2 (two) times daily as needed. Do not refill until 30 days from prescription date Patient not taking: Reported on 10/22/2019 08/20/19   Dettinger, Elige Radon, MD  HYDROcodone-acetaminophen (NORCO) 10-325 MG tablet Take 1 tablet by mouth 2 (two) times daily as needed for moderate pain. Patient not taking: Reported on 10/22/2019 07/28/19   Dettinger, Elige Radon, MD  ibuprofen (ADVIL,MOTRIN) 200 MG tablet Take 200 mg by mouth every 6 (six) hours as needed for mild pain or moderate pain.     [provider]  Multiple Vitamins-Minerals (MULTIVITAMIN WITH MINERALS) tablet Take 1 tablet by mouth daily.    [provider]  terbinafine (LAMISIL AT) 1 % cream Apply 1 application  topically 2 (two) times daily. Patient not taking: Reported on 07/22/2019 07/17/19   Dettinger, Elige Radon, MD  zinc gluconate 50 MG tablet Take 50 mg by mouth daily.    [provider]    Inpatient Medications: Scheduled Meds: . Chlorhexidine Gluconate Cloth  6 each Topical Q0600  . insulin aspart  0-15 Units Subcutaneous TID WC  . insulin aspart  0-5 Units Subcutaneous QHS  . insulin glargine  20 Units Subcutaneous QHS  . ondansetron (ZOFRAN) IV  4 mg Intravenous Q6H  . sodium chloride flush  3 mL Intravenous Q12H   Continuous Infusions: . amiodarone    . amiodarone    . diltiazem (CARDIZEM) infusion Stopped (10/23/19 0357)  . heparin 1,400 Units/hr (10/23/19 0827)  . lactated ringers with KCl 20 mEq/L    . magnesium sulfate bolus IVPB 2 g (10/23/19  0830)  . potassium chloride 10 mEq (10/23/19 0826)  . vancomycin     PRN Meds: acetaminophen **OR** acetaminophen, fentaNYL (SUBLIMAZE) injection, fluticasone, promethazine  Allergies:   No Known Allergies  Social History:   Social History   Socioeconomic History  . Marital status: Single    Spouse name: Not on file  . Number of children: Not on file  . Years of education: Not on file  . Highest education level: Not on file  Occupational History  . Occupation: Receiving  Tobacco Use  . Smoking status: Never Smoker  . Smokeless tobacco: Never Used  Substance and Sexual Activity  . Alcohol use: No  . Drug use: No  . Sexual activity: Not on file  Other Topics Concern  . Not on file  Social History Narrative  . Not on file   Social Determinants of Health   Financial Resource Strain:   . Difficulty of Paying Living Expenses: Not on file  Food Insecurity:   . Worried About Charity fundraiser in the Last Year: Not on file  . Ran Out of Food in the Last Year: Not on file  Transportation Needs:   . Lack of Transportation (Medical): Not on file  . Lack of Transportation (Non-Medical): Not on file  Physical  Activity:   . Days of Exercise per Week: Not on file  . Minutes of Exercise per Session: Not on file  Stress:   . Feeling of Stress : Not on file  Social Connections:   . Frequency of Communication with Friends and Family: Not on file  . Frequency of Social Gatherings with Friends and Family: Not on file  . Attends Religious Services: Not on file  . Active Member of Clubs or Organizations: Not on file  . Attends Archivist Meetings: Not on file  . Marital Status: Not on file  Intimate Partner Violence:   . Fear of Current or Ex-Partner: Not on file  . Emotionally Abused: Not on file  . Physically Abused: Not on file  . Sexually Abused: Not on file    Family History:   Family History  Problem Relation Age of Onset  . Stroke Father   . Asthma Father   . COPD Father   . Aneurysm Father        AAA  . Diabetes Brother   . Stroke Brother        2008  . Diabetes Mother    Family Status:  Family Status  Relation Name Status  . Father  Alive  . Brother  Alive  . Mother  Alive    ROS:  Please see the history of present illness.  All other ROS reviewed and negative.     Physical Exam/Data:   Vitals:   10/23/19 0700 10/23/19 0715 10/23/19 0722 10/23/19 0745  BP: 127/68 108/69  (!) 116/59  Pulse: (!) 124 (!) 117  (!) 121  Resp: (!) 36 (!) 25  (!) 32  Temp:   99.5 F (37.5 C)   TempSrc:   Oral   SpO2: 96% 96%  96%  Weight:      Height:        Intake/Output Summary (Last 24 hours) at 10/23/2019 0919 Last data filed at 10/23/2019 0618 Gross per 24 hour  Intake 4461.25 ml  Output 800 ml  Net 3661.25 ml   Filed Weights   10/22/19 1445 10/22/19 2110 10/23/19 0500  Weight: 97.5 kg 93.8 kg 93.8 kg   Body mass  index is 25.17 kg/m.   General:  Well nourished male in no acute distress HEENT: normal Lymph: no adenopathy Neck: JVD -not elevated Endocrine:  No thryomegaly Vascular: No carotid bruits; 4/4 extremity pulses 2+  Cardiac: Irregularly irregular;  no gallop Lungs:  clear bilaterally, no wheezing, rhonchi or rales  Abd: soft, nontender, no hepatomegaly  Ext: RLE edema, erythema Musculoskeletal: Status post first through third toe amputations on the right, left foot amputation. Skin: warm and dry  Neuro:  CNs 2-12 intact, no focal abnormalities noted Psych:  Normal affect   EKG:  The EKG was personally reviewed and demonstrates: 12/16 ECG is atrial fibrillation, heart rate 187, no acute ischemic changes Telemetry:  Telemetry was personally reviewed and demonstrates: Atrial fibrillation, RVR with heart rate 110-120 at baseline, higher at times   CV studies:   ECHO: Ordered  Laboratory Data:   Chemistry Recent Labs  Lab 10/22/19 1520 10/23/19 0359  NA 125* 132*  K 3.4* 3.3*  CL 89* 98  CO2 21* 22  GLUCOSE 389* 266*  BUN 40* 31*  CREATININE 1.42* 0.97  CALCIUM 8.8* 8.5*  GFRNONAA 55* >60  GFRAA >60 >60  ANIONGAP 15 12    Lab Results  Component Value Date   ALT 28 10/23/2019   AST 25 10/23/2019   ALKPHOS 83 10/23/2019   BILITOT 1.6 (H) 10/23/2019   Hematology Recent Labs  Lab 10/22/19 1520 10/23/19 0359  WBC 12.1* 7.7  RBC 5.24 4.44  HGB 16.0 13.8  HCT 46.1 39.0  MCV 88.0 87.8  MCH 30.5 31.1  MCHC 34.7 35.4  RDW 11.8 11.9  PLT 176 123*   Cardiac Enzymes High Sensitivity Troponin:  No results for input(s): TROPONINIHS in the last 720 hours.   TSH:  Lab Results  Component Value Date   TSH 0.929 10/23/2019   Lipids: Lab Results  Component Value Date   CHOL 126 07/22/2019   HDL 31 (L) 07/22/2019   LDLCALC 63 07/22/2019   TRIG 189 (H) 07/22/2019   CHOLHDL 4.1 07/22/2019   HgbA1c: Lab Results  Component Value Date   HGBA1C 9.8 (H) 10/23/2019   Magnesium:  Magnesium  Date Value Ref Range Status  10/23/2019 1.7 1.7 - 2.4 mg/dL Final    Comment:    Performed at [facility], [address] [place], [place] [address]     Radiology/Studies:  DG Tibia/Fibula Right  Result Date:  10/22/2019 CLINICAL DATA:  Right leg pain and tenderness. Fever. Clinical evidence of infection. Diabetes. EXAM: RIGHT TIBIA AND FIBULA - 2 VIEW COMPARISON:  Right ankle radiographs obtained at the same time. FINDINGS: Mild diffuse soft tissue swelling. Previously described chronic appearing periosteal reaction involving the distal tibia. On these radiographs, this is involving the medial and posterior aspects of the tibia. Also again noted are previously described bone collapse and fragmentation involving the talotibial joint and flattening of the normal plantar arch. No soft tissue gas is seen. Mild medial and lateral spur formation at the knee joint with mild to moderate medial joint space narrowing. IMPRESSION: 1. Chronic appearing periosteal reaction involving the posterior and medial aspects of the distal tibia. Underlying chronic osteomyelitis is a possibility. 2. Diffuse soft tissue swelling without gas or bone destruction. 3. 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 Electronically Signed   By: Beckie Salts M.D.   On: 10/22/2019 16:05   DG Ankle Complete Right  Result Date: 10/22/2019 CLINICAL DATA:  Right leg pain and tenderness and fever with  clinical evidence of infection. Diabetes. EXAM: RIGHT ANKLE - COMPLETE 3+ VIEW COMPARISON:  Right foot radiographs obtained today and on 06/19/2016. FINDINGS: Diffuse soft tissue swelling. Bone collapse and fragmentation at the talotibial joint, described on the foot radiographs, in addition to previously described flattening of the normal plantar arch. Again demonstrated are is chronic appearing posterior periosteal reaction involving the distal tibia. No bone destruction or soft tissue gas seen. IMPRESSION: 1. Diffuse soft tissue swelling without underlying changes of acute osteomyelitis. 2. Neuropathic changes at the talotibial joint. 3. Pes planus. Electronically Signed   By: Beckie SaltsSteven  Reid M.D.   On: 10/22/2019 16:02   DG Chest Port 1  View  Result Date: 10/22/2019 CLINICAL DATA:  Fever EXAM: PORTABLE CHEST 1 VIEW COMPARISON:  12/09/2013 chest radiograph. FINDINGS: Stable cardiomediastinal silhouette with top-normal heart size. No pneumothorax. No pleural effusion. Lungs appear clear, with no acute consolidative airspace disease and no pulmonary edema. IMPRESSION: No active disease. Electronically Signed   By: Delbert PhenixJason A Poff M.D.   On: 10/22/2019 15:55   DG Foot Complete Right  Result Date: 10/22/2019 CLINICAL DATA:  Right foot pain and fever. Diabetes. Clinical infection. EXAM: RIGHT FOOT COMPLETE - 3+ VIEW COMPARISON:  06/19/2016. FINDINGS: Diffuse soft tissue swelling. Again demonstrated surgical absence of the right great toe with interval surgical absence of the right little toe. Well-defined bone margins. No periosteal reaction or bone destruction involving the bones of the foot. There is flattening of the normal plantar arch and interval partial bone collapse and fragmentation involving the distal tibia and talus. There is also chronic appearing periosteal thickening and irregularity involving the distal tibia posteriorly. IMPRESSION: 1. Diffuse soft tissue swelling without soft tissue gas or evidence of underlying osteomyelitis. 2. Pes planus. 3. 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 Electronically Signed   By: Beckie SaltsSteven  Reid M.D.   On: 10/22/2019 15:59    Assessment and Plan:   1.  Atrial fibrillation, RVR -Heart rate has improved with IV diltiazem, currently at 12.5 mg/h. -However, blood pressure limits titration of the diltiazem or addition of beta-blocker and heart rate is still not well controlled -Initial creatinine was elevated at 1.47 but has now normalized. - add amio, no bolus to help w/ HR control  2.  Preoperative cardiovascular evaluation -[name] is active around the house and yard without any ischemic symptoms and has no history of ischemic heart disease -His  RCRI suggest overall low perioperative risk of major cardiac events. -However, because of his significant metabolic derangements, sepsis and rapid atrial fibrillation, his risk level is at least intermediate. -Again however, his heart rate is better controlled and his metabolic derangements as well as the sepsis are improving. -Follow-up on echo, but no further cardiac evaluation indicated prior to surgery.  Otherwise, per IM and, Dr. Nolen MuMcKinney. Principal Problem:   Severe sepsis (HCC) Active Problems:   Essential hypertension, benign   Charcot ankle   Type 2 diabetes mellitus (HCC)   Diabetic foot infection (HCC)   S/P BKA (below knee amputation) unilateral, left (HCC)   Cellulitis of right lower extremity   Elevated lactic acid level   Leukocytosis   Atrial fibrillation with rapid ventricular response (HCC)   Nausea & vomiting   Diarrhea   AKI (acute kidney injury) (HCC)   Hypokalemia   Acute hyponatremia   Bacteremia due to Gram-positive bacteria   DKA, type 2 (HCC)   Uncontrolled type 2 diabetes mellitus with hyperglycemia, with long-term current use  of insulin (HCC)    For questions or updates, please contact [facility] Please consult [email or web address] for contact info under Cardiology/STEMI.   Melida Quitter, PA-C 10/23/2019 9:19 AM   Attending note:  Patient seen and examined.  I reviewed his records and discussed the case with Ms. Raford Pitcher PA-C.  [name] presents with sepsis and right foot cellulitis, gram-positive cocci on blood culture and currently on vancomycin, noted in the setting of uncontrolled type 2 diabetes mellitus with neuropathy and previous amputations.  Foot MRI pending.  He was also found to be in rapid atrial fibrillation, newly documented and presumably of recent onset.  CHA2DS2-VASc or is 3.  At baseline he uses a prosthesis, denies any obvious exertional angina or breathlessness beyond NYHA class II.  No sense of palpitations or history of  atrial fibrillation.  On examination this morning he is somnolent, did not sleep well overnight.  He is afebrile, heart rate 110-120 in atrial fibrillation by telemetry which I personally reviewed.  Systolic blood pressure 110-130.  Good urine output.  Lungs are clear.  Cardiac exam reveals irregularly irregular rhythm without gallop.  Lab work shows potassium 3.3, BUN 31, creatinine 0.97, lactic acid down to 2.2, hemoglobin 13.8, platelets 123, hemoglobin A1c 8.9%, TSH 0.92, SARS coronavirus 2 test negative.  Tracing from 10/22/2019 showed rapid atrial fibrillation with left anterior fascicular block, poor anterior R wave progression, nonspecific ST changes.  Chest x-ray reports no active disease.  Atrial fibrillation with RVR, presumably recent onset in the setting of sepsis with right foot cellulitis, uncontrolled type 2 diabetes mellitus, and gram-positive cocci bacteremia.  CHA2DS2-VASc or is 3.  He is currently on heparin and intravenous diltiazem.  Also starting IV amiodarone without bolus to provide additional heart rate control and possibly aid in conversion to sinus rhythm.  Blood pressure low normal at this time.  Echocardiogram pending for assessment of LVEF.  In terms of preoperative cardiac assessment, his typical baseline would place him in a low risk category by RCRI calculator.  In the setting of his present comorbidities however, his risk is at least intermediate.  Jonelle Sidle, M.D., F.A.C.C.

## 2019-10-23 NOTE — Progress Notes (Signed)
Inpatient Diabetes Program Recommendations  AACE/ADA: New Consensus Statement on Inpatient Glycemic Control (2015)  Target Ranges:  Prepandial:   less than 140 mg/dL      Peak postprandial:   less than 180 mg/dL (1-2 hours)      Critically ill patients:  140 - 180 mg/dL   Lab Results  Component Value Date   GLUCAP 278 (H) 10/22/2019   HGBA1C 9.8 (H) 10/23/2019    Review of Glycemic Control  Diabetes history: DM2 Outpatient Diabetes medications: Toujeo 40-50 units qd Current orders for Inpatient glycemic control: lantus 20 units qd + Novolog moderate correction tid + hs 0-5 units  Inpatient Diabetes Program Recommendations:   Increase Lantus to 30 units qd  Will plan to speak with patient when stable.  Thank you, Nani Gasser. Izzac Rockett, RN, MSN, CDE  Diabetes Coordinator Inpatient Glycemic Control Team Team Pager [phone] (8am-5pm) 10/23/2019 9:47 AM

## 2019-10-23 NOTE — Progress Notes (Signed)
ANTICOAGULATION CONSULT NOTE - Initial Consult   Pharmacy Consult for heparin Indication: atrial fibrillation  No Known Allergies  Patient Measurements: Height: 6\' 4"  (193 cm) Weight: 206 lb 12.7 oz (93.8 kg) IBW/kg (Calculated) : 86.8 Heparin Dosing Weight: 94 kg  Vital Signs: Temp: 99.5 F (37.5 C) (12/17 0722) Temp Source: Oral (12/17 0722) BP: 121/68 (12/17 0600) Pulse Rate: 120 (12/17 0600)  Labs: Recent Labs    10/22/19 1520 10/23/19 0359  HGB 16.0 13.8  HCT 46.1 39.0  PLT 176 123*  APTT 30  --   LABPROT 15.9*  --   INR 1.3*  --   CREATININE 1.42* 0.97    Estimated Creatinine Clearance: 105.6 mL/min (by C-G formula based on SCr of 0.97 mg/dL).   Medical History: Past Medical History:  Diagnosis Date  . Boil    numerous boils in past  . Charcot ankle   . Diabetes mellitus    diagnosed age 77  . Hypertension   . Neuropathy associated with endocrine disorder ([place])   . Osteomyelitis of toe of right foot (HCC)     Medications:  Medications Prior to Admission  Medication Sig Dispense Refill Last Dose  . atorvastatin (LIPITOR) 20 MG tablet Take 1 tablet (20 mg total) by mouth daily. 90 tablet 3   . doxycycline (VIBRA-TABS) 100 MG tablet Take 100 mg by mouth 2 (two) times daily. Started course on 10/10/2019     . gabapentin (NEURONTIN) 600 MG tablet Take 1 tablet (600 mg total) by mouth at bedtime. 90 tablet 3   . Insulin Glargine, 2 Unit Dial, (TOUJEO MAX SOLOSTAR) 300 UNIT/ML SOPN Inject 40-50 Units into the skin daily. 5 pen 5   . lisinopril (PRINIVIL,ZESTRIL) 5 MG tablet Take 1 tablet (5 mg total) by mouth daily. 90 tablet 3   . metFORMIN (GLUCOPHAGE) 1000 MG tablet Take 1 tablet (1,000 mg total) by mouth 2 (two) times daily. 180 tablet 3   . albuterol (PROVENTIL HFA;VENTOLIN HFA) 108 (90 Base) MCG/ACT inhaler Inhale 2 puffs into the lungs every 6 (six) hours as needed for wheezing or shortness of breath. 1 Inhaler 0   . aspirin (ASPIRIN EC) 81 MG EC  tablet Take 81 mg by mouth daily. Swallow whole.     . Continuous Blood Gluc Receiver (FREESTYLE LIBRE 14 DAY READER) DEVI 1 each by Does not apply route daily. (Patient not taking: Reported on 10/22/2019) 1 each 0 Not Taking at Unknown time  . Continuous Blood Gluc Sensor (FREESTYLE LIBRE 14 DAY SENSOR) MISC 1 each by Other route every 14 (fourteen) days. (Patient not taking: Reported on 10/22/2019) 6 each 3 Not Taking at Unknown time  . cyclobenzaprine (FLEXERIL) 10 MG tablet Take 1 tablet (10 mg total) by mouth at bedtime as needed for muscle spasms. 30 tablet 3   . DULoxetine (CYMBALTA) 60 MG capsule Take 1 capsule (60 mg total) by mouth daily. 90 capsule 3   . fluticasone (FLONASE) 50 MCG/ACT nasal spray Place 1 spray into both nostrils 2 (two) times daily as needed for allergies or rhinitis. 16 g 6   . HYDROcodone-acetaminophen (NORCO) 10-325 MG tablet Take 1 tablet by mouth 2 (two) times daily as needed. Do not refill until 60 days from prescription date 60 tablet 0   . HYDROcodone-acetaminophen (NORCO) 10-325 MG tablet Take 1 tablet by mouth 2 (two) times daily as needed. Do not refill until 30 days from prescription date (Patient not taking: Reported on 10/22/2019) 60 tablet 0 Not Taking  at Unknown time  . HYDROcodone-acetaminophen (NORCO) 10-325 MG tablet Take 1 tablet by mouth 2 (two) times daily as needed for moderate pain. (Patient not taking: Reported on 10/22/2019) 60 tablet 0 Not Taking at Unknown time  . ibuprofen (ADVIL,MOTRIN) 200 MG tablet Take 200 mg by mouth every 6 (six) hours as needed for mild pain or moderate pain.      . Multiple Vitamins-Minerals (MULTIVITAMIN WITH MINERALS) tablet Take 1 tablet by mouth daily.     Marland Kitchen terbinafine (LAMISIL AT) 1 % cream Apply 1 application topically 2 (two) times daily. (Patient not taking: Reported on 07/22/2019) 42 g 0   . zinc gluconate 50 MG tablet Take 50 mg by mouth daily.       Assessment: Pharmacy consulted to dose heparin in patient  with atrial fibrillation.  Patient is not on anticoagulation prior to admission.  Goal of Therapy:  Heparin level 0.3-0.7 units/ml Monitor platelets by anticoagulation protocol: Yes    Plan:  Give 5000 units bolus x 1 Start heparin infusion at 1400 units/hr Check anti-Xa level in 6 hours and daily while on heparin Continue to monitor H&H and platelets  Judeth Cornfield, PharmD Clinical Pharmacist 10/23/2019 7:44 AM

## 2019-10-24 ENCOUNTER — Encounter (HOSPITAL_COMMUNITY)
Admission: EM | Disposition: A | Discharge status: Home Health | Payer: Self-pay | Source: Home / Self Care | Attending: Internal Medicine

## 2019-10-24 ENCOUNTER — Inpatient Hospital Stay (HOSPITAL_COMMUNITY): Payer: Medicare Other

## 2019-10-24 ENCOUNTER — Other Ambulatory Visit: Payer: Self-pay

## 2019-10-24 ENCOUNTER — Encounter (HOSPITAL_COMMUNITY): Payer: Self-pay | Admitting: Internal Medicine

## 2019-10-24 ENCOUNTER — Inpatient Hospital Stay (HOSPITAL_COMMUNITY): Payer: Medicare Other | Admitting: Anesthesiology

## 2019-10-24 DIAGNOSIS — L03115 Cellulitis of right lower limb: Secondary | ICD-10-CM

## 2019-10-24 DIAGNOSIS — M869 Osteomyelitis, unspecified: Secondary | ICD-10-CM

## 2019-10-24 DIAGNOSIS — A419 Sepsis, unspecified organism: Secondary | ICD-10-CM | POA: Diagnosis present

## 2019-10-24 HISTORY — PX: AMPUTATION: SHX166

## 2019-10-24 LAB — COMPREHENSIVE METABOLIC PANEL
ALT: 27 U/L (ref 0–44)
AST: 22 U/L (ref 15–41)
Albumin: 2.1 g/dL — ABNORMAL LOW (ref 3.5–5.0)
Alkaline Phosphatase: 69 U/L (ref 38–126)
Anion gap: 7 (ref 5–15)
BUN: 25 mg/dL — ABNORMAL HIGH (ref 6–20)
CO2: 28 mmol/L (ref 22–32)
Calcium: 8.2 mg/dL — ABNORMAL LOW (ref 8.9–10.3)
Chloride: 96 mmol/L — ABNORMAL LOW (ref 98–111)
Creatinine, Ser: 0.87 mg/dL (ref 0.61–1.24)
GFR calc Af Amer: >60 mL/min (ref >60)
GFR calc non Af Amer: >60 mL/min (ref >60)
Glucose, Bld: 242 mg/dL — ABNORMAL HIGH (ref 70–99)
Potassium: 3.8 mmol/L (ref 3.5–5.1)
Sodium: 131 mmol/L — ABNORMAL LOW (ref 135–145)
Total Bilirubin: 1.3 mg/dL — ABNORMAL HIGH (ref 0.3–1.2)
Total Protein: 5.4 g/dL — ABNORMAL LOW (ref 6.5–8.1)

## 2019-10-24 LAB — HEPARIN LEVEL (UNFRACTIONATED): Heparin Unfractionated: <0.1 IU/mL — ABNORMAL LOW (ref 0.30–0.70)

## 2019-10-24 LAB — MAGNESIUM: Magnesium: 1.8 mg/dL (ref 1.7–2.4)

## 2019-10-24 LAB — GLUCOSE, CAPILLARY
Glucose-Capillary: 233 mg/dL — ABNORMAL HIGH (ref 70–99)
Glucose-Capillary: 224 mg/dL — ABNORMAL HIGH (ref 70–99)
Glucose-Capillary: 232 mg/dL — ABNORMAL HIGH (ref 70–99)
Glucose-Capillary: 225 mg/dL — ABNORMAL HIGH (ref 70–99)

## 2019-10-24 LAB — CBC
HCT: 36 % — ABNORMAL LOW (ref 39.0–52.0)
Hemoglobin: 12.4 g/dL — ABNORMAL LOW (ref 13.0–17.0)
MCH: 30.2 pg (ref 26.0–34.0)
MCHC: 34.4 g/dL (ref 30.0–36.0)
MCV: 87.8 fL (ref 80.0–100.0)
Platelets: 126 10*3/uL — ABNORMAL LOW (ref 150–400)
RBC: 4.1 MIL/uL — ABNORMAL LOW (ref 4.22–5.81)
RDW: 12 % (ref 11.5–15.5)
WBC: 8 10*3/uL (ref 4.0–10.5)
nRBC: 0 % (ref 0.0–0.2)

## 2019-10-24 LAB — PROCALCITONIN: Procalcitonin: 7.96 ng/mL

## 2019-10-24 LAB — LACTIC ACID, PLASMA: Lactic Acid, Venous: 2.2 mmol/L (ref 0.5–1.9)

## 2019-10-24 SURGERY — AMPUTATION DIGIT
Anesthesia: Monitor Anesthesia Care | Laterality: Right

## 2019-10-24 MED ORDER — 0.9 % SODIUM CHLORIDE (POUR BTL) OPTIME
TOPICAL | Status: DC | PRN
  Administered 2019-10-24: 1000 mL

## 2019-10-24 MED ORDER — MIDAZOLAM HCL 2 MG/2ML IJ SOLN
INTRAMUSCULAR | Status: DC | PRN
  Administered 2019-10-24: 2 mg via INTRAVENOUS

## 2019-10-24 MED ORDER — MIDAZOLAM HCL 2 MG/2ML IJ SOLN: 0.5000 mg | Freq: Once | INTRAMUSCULAR | Status: DC | PRN

## 2019-10-24 MED ORDER — MIDAZOLAM HCL 2 MG/2ML IJ SOLN
INTRAMUSCULAR | Status: AC
  Filled 2019-10-24: qty 2

## 2019-10-24 MED ORDER — SODIUM CHLORIDE 0.9 % IV BOLUS
1000.0000 mL | Freq: Once | INTRAVENOUS | Status: AC
  Administered 2019-10-24: 1000 mL via INTRAVENOUS

## 2019-10-24 MED ORDER — BUPIVACAINE HCL (PF) 0.5 % IJ SOLN
INTRAMUSCULAR | Status: AC
  Filled 2019-10-24: qty 30

## 2019-10-24 MED ORDER — MAGNESIUM SULFATE 2 GM/50ML IV SOLN
2.0000 g | Freq: Once | INTRAVENOUS | Status: AC
  Administered 2019-10-24: 2 g via INTRAVENOUS
  Filled 2019-10-24: qty 50

## 2019-10-24 MED ORDER — HYDROCODONE-ACETAMINOPHEN 7.5-325 MG PO TABS: 1.0000 | ORAL_TABLET | Freq: Once | ORAL | Status: DC | PRN

## 2019-10-24 MED ORDER — HYDROMORPHONE HCL 1 MG/ML IJ SOLN: 0.2500 mg | INTRAMUSCULAR | Status: DC | PRN

## 2019-10-24 MED ORDER — SODIUM CHLORIDE 0.9 % IV SOLN
2.0000 g | Freq: Three times a day (TID) | INTRAVENOUS | Status: DC
  Administered 2019-10-24 – 2019-10-26 (×6): 2 g via INTRAVENOUS
  Filled 2019-10-24 (×6): qty 2

## 2019-10-24 MED ORDER — INSULIN GLARGINE 100 UNIT/ML ~~LOC~~ SOLN
30.0000 [IU] | Freq: Every day | SUBCUTANEOUS | Status: DC
  Administered 2019-10-24 – 2019-10-29 (×6): 30 [IU] via SUBCUTANEOUS
  Filled 2019-10-24 (×10): qty 0.3

## 2019-10-24 MED ORDER — LACTATED RINGERS IV SOLN: INTRAVENOUS | Status: DC

## 2019-10-24 MED ORDER — BUPIVACAINE HCL (PF) 0.5 % IJ SOLN
INTRAMUSCULAR | Status: DC | PRN
  Administered 2019-10-24: 10 mL

## 2019-10-24 SURGICAL SUPPLY — 38 items
BANDAGE ELASTIC 6 VELCRO NS (GAUZE/BANDAGES/DRESSINGS) ×1 IMPLANT
BANDAGE ESMARK 4X12 BL STRL LF (DISPOSABLE) ×1 IMPLANT
BENZOIN TINCTURE PRP APPL 2/3 (GAUZE/BANDAGES/DRESSINGS) ×1 IMPLANT
BLADE AVERAGE 25X9 (BLADE) ×1 IMPLANT
BLADE SURG 15 STRL LF DISP TIS (BLADE) ×1 IMPLANT
BLADE SURG 15 STRL SS (BLADE) ×1
BNDG CONFORM 2 STRL LF (GAUZE/BANDAGES/DRESSINGS) ×3 IMPLANT
BNDG ELASTIC 4X5.8 VLCR NS LF (GAUZE/BANDAGES/DRESSINGS) ×2 IMPLANT
BNDG ESMARK 4X12 BLUE STRL LF (DISPOSABLE) ×2
BNDG GAUZE ELAST 4 BULKY (GAUZE/BANDAGES/DRESSINGS) ×2 IMPLANT
CLOTH BEACON ORANGE TIMEOUT ST (SAFETY) ×2 IMPLANT
COVER LIGHT HANDLE STERIS (MISCELLANEOUS) ×4 IMPLANT
COVER WAND RF STERILE (DRAPES) ×4 IMPLANT
CUFF TOURN SGL QUICK 18X4 (TOURNIQUET CUFF) ×2 IMPLANT
DECANTER SPIKE VIAL GLASS SM (MISCELLANEOUS) ×2 IMPLANT
DRSG ADAPTIC 3X8 NADH LF (GAUZE/BANDAGES/DRESSINGS) ×2 IMPLANT
ELECT REM PT RETURN 9FT ADLT (ELECTROSURGICAL) ×2
ELECTRODE REM PT RTRN 9FT ADLT (ELECTROSURGICAL) ×1 IMPLANT
GAUZE IODOFORM PACK 1/2 7832 (GAUZE/BANDAGES/DRESSINGS) ×1 IMPLANT
GAUZE SPONGE 4X4 12PLY STRL (GAUZE/BANDAGES/DRESSINGS) ×3 IMPLANT
GLOVE BIO SURGEON STRL SZ7 (GLOVE) ×2 IMPLANT
GLOVE BIO SURGEON STRL SZ7.5 (GLOVE) ×2 IMPLANT
GLOVE BIOGEL PI IND STRL 7.0 (GLOVE) ×2 IMPLANT
GLOVE BIOGEL PI INDICATOR 7.0 (GLOVE) ×3
GOWN STRL REUS W/TWL LRG LVL3 (GOWN DISPOSABLE) ×4 IMPLANT
KIT TURNOVER KIT A (KITS) ×2 IMPLANT
MANIFOLD NEPTUNE II (INSTRUMENTS) ×2 IMPLANT
NDL HYPO 27GX1-1/4 (NEEDLE) ×2 IMPLANT
NEEDLE HYPO 27GX1-1/4 (NEEDLE) ×4 IMPLANT
NS IRRIG 1000ML POUR BTL (IV SOLUTION) ×2 IMPLANT
PACK BASIC LIMB (CUSTOM PROCEDURE TRAY) ×2 IMPLANT
PAD ARMBOARD 7.5X6 YLW CONV (MISCELLANEOUS) ×2 IMPLANT
SET BASIN LINEN APH (SET/KITS/TRAYS/PACK) ×2 IMPLANT
SOL PREP PROV IODINE SCRUB 4OZ (MISCELLANEOUS) ×2 IMPLANT
STRIP CLOSURE SKIN 1/2X4 (GAUZE/BANDAGES/DRESSINGS) ×1 IMPLANT
SUT PROLENE 4 0 PS 2 18 (SUTURE) IMPLANT
SUT VICRYL AB 3-0 FS1 BRD 27IN (SUTURE) ×2 IMPLANT
SYR CONTROL 10ML LL (SYRINGE) ×4 IMPLANT

## 2019-10-24 NOTE — Progress Notes (Signed)
PROGRESS NOTE  [name] ZTI:[id number] DOB: 07/22/1966 DOA: 10/22/2019 PCP: Dettinger, [name], MD  Brief History:  53 year old male with a history of diabetes mellitus type 2, diabetic polyneuropathy, hypertension, left BKA, and right toe amputations secondary to diabetic foot infections presenting with 1 day history of increasing erythema, edema, and pain on his right foot.  The patient stated that his podiatrist, Dr. [name], had started him on 10 days of doxycycline on 10/10/2019 for his diabetic foot ulcer on his right fourth toe on 10/10/2019.  The patient is a bit vague regarding the history of his most recent foot ulcer on his right fourth toe, but stated that he has noted some intermittent drainage in the past 1 to 2 days prior to admission.  He had denied any fevers, chills, headache, neck pain, chest pain, shortness breath, cough, hemoptysis, abdominal pain, dysuria, hematuria.  The patient did have episodes of nausea and vomiting for the past 3 days.  He is also noted some loose stools without any hematochezia or melena.  Because of his nausea, vomiting, and right foot erythema and pain he presented for further evaluation.  In the ED, the patient had temperature 100.6 F.  He was noted to have atrial fibrillation with RVR with heart rates into the 180s.  The patient was hemodynamically stable with oxygen saturation 96-97% on room air.  WBC was 12.1 with hemoglobin 16.0 and platelets 176K.  Sodium was 125 with serum creatinine 1.42.  Lactic acid peaked at 3.8.  The patient was fluid resuscitated and started on vancomycin and Zosyn.  Blood cultures from the emergency department were also obtained.  Assessment/Plan: Sepsis with organ dysfunction -Secondary to diabetic foot infection and bacteremia -Lactic acid peaked 3.8 -check PCT 10.37>>>7.96 -Continue vancomycin -Discontinue Zosyn -Continue IV fluids -SARS-CoV2--neg  Diabetic foot infection/cellulitis right  foot/Osteomyelitis toe -Podiatry, Dr. [name] has been consulted -MRI foot--osteomyelitis of R-third toe with diffuse cellulitis and myositis -CRP 40.7 -ESR 43  Bacteremia--gram positive -Source is likely foot infection -Repeat blood cultures--neg to date -Gram-positive cocci 2 out of 2 sets -Continue vancomycin pending culture data -Discontinue Zosyn  DKA, type 2 -pt presented with serum glucose 389 with anion gap 15 and ketonuria -resolved with aggressive fluid resuscitation -transition to [place] insulin  AKI -Resulting from sepsis -Baseline creatinine 0.7-1.0 -Serum creatinine peaked 1.42 -A.m. BMP  Atrial fibrillation with RVR, type unspecified -CHADS-VASc = 3 (HTN, DM2, PAD) -Continue IV heparin in anticipation for possible surgery on foot -Echo--EF 70-75%, no WMA, trace MR/TR, mild increase PASP -TSH--0.929 -continue amiodarone and diltiazem drips through perioperative period -appreciate cardiology followup  Uncontrolled diabetes mellitus type 2 with hyperglycemia -Anticipate elevated CBGs secondary to sepsis -07/22/2019 hemoglobin A1c 12.4 -Increase Lantus to 30 units hs -NovoLog sliding scale -Holding metformin -10/23/19 A1C--9.8  Hypokalemia/hypomagnesemia -Replete -Add potassium to IV fluids  Hyponatremia -Multifactorial including hyperglycemia, volume depletion, AKI -Continue fluid resuscitation  Intractable nausea and vomiting -Suspect a component of gastritis/diabetic gastroparesis incited by sepsis and DKA -Continue around-the-clock Zofran -slowly advance diet as tolerated  Diarrhea -C. difficile negative -If persistent, check stool pathogen panel  Essential hypertension -Blood pressure currently controlled on diltiazem drip -Holding lisinopril secondary to AKI  Hyperlipidemia -Continue statin when able to tolerate po       Disposition Plan:   Remain in ICU  Family Communication: No  Family at bedside  Consultants:   Cardiology, podiatry  Code Status:  FULL  DVT Prophylaxis:  IV Heparin    Procedures: As Listed in Progress Note Above  Antibiotics: vanco 12/16>>> Zosyn 12/16>>>12/17   The patient is critically ill with multiple organ systems failure and requires high complexity decision making for assessment and support, frequent evaluation and titration of therapies, application of advanced monitoring technologies and extensive interpretation of multiple databases.  Critical care time - 35 mins.       Subjective: States emesis is improving but still intermittent nausea.  Patient denies fevers, chills, headache, chest pain, dyspnea,  vomiting, diarrhea, abdominal pain, dysuria, hematuria, hematochezia, and melena. Pain is improving in right foot.  Objective: Vitals:   10/24/19 0700 10/24/19 0800 10/24/19 0900 10/24/19 1000  BP: 123/72 120/77 125/77 119/77  Pulse: 89 (!) 103 100 89  Resp: (!) 25 (!) 31 (!) 34 (!) 30  Temp:      TempSrc:      SpO2: 100% 97% 99% 98%  Weight:      Height:        Intake/Output Summary (Last 24 hours) at 10/24/2019 1055 Last data filed at 10/24/2019 0800 Gross per 24 hour  Intake 3835.32 ml  Output 1100 ml  Net 2735.32 ml   Weight change: -3.423 kg Exam:   General:  Pt is alert, follows commands appropriately, not in acute distress  HEENT: No icterus, No thrush, No neck mass, [place]/AT  Cardiovascular: IRRR, S1/S2, no rubs, no gallops  Respiratory: CTA bilaterally, no wheezing, no crackles, no rhonchi  Abdomen: Soft/+BS, non tender, non distended, no guarding  Extremities: +edema and erythema of right foot without crepitance.  S/p L-BKA   Data Reviewed: I have personally reviewed following labs and imaging studies Basic Metabolic Panel: Recent Labs  Lab 10/22/19 1520 10/23/19 0359 10/24/19 0357  NA 125* 132* 131*  K 3.4* 3.3* 3.8  CL 89* 98 96*  CO2 21* 22 28  GLUCOSE 389* 266* 242*  BUN 40* 31* 25*  CREATININE 1.42* 0.97  0.87  CALCIUM 8.8* 8.5* 8.2*  MG 1.5* 1.7 1.8   Liver Function Tests: Recent Labs  Lab 10/22/19 1520 10/23/19 0359 10/24/19 0357  AST _0 ALT _1 ALKPHOS 123 83 69  BILITOT 2.0* 1.6* 1.3*  PROT 7.5 6.0* 5.4*  ALBUMIN 3.4* 2.6* 2.1*   No results for input(s): LIPASE, AMYLASE in the last 168 hours. No results for input(s): AMMONIA in the last 168 hours. Coagulation Profile: Recent Labs  Lab 10/22/19 1520  INR 1.3*   CBC: Recent Labs  Lab 10/22/19 1520 10/23/19 0359 10/24/19 0357  WBC 12.1* 7.7 8.0  NEUTROABS 10.2* 6.3  --   HGB 16.0 13.8 12.4*  HCT 46.1 39.0 36.0*  MCV 88.0 87.8 87.8  PLT 176 123* 126*   Cardiac Enzymes: No results for input(s): CKTOTAL, CKMB, CKMBINDEX, TROPONINI in the last 168 hours. BNP: Invalid input(s): POCBNP CBG: Recent Labs  Lab 10/22/19 1448 10/22/19 2128 10/23/19 1159 10/23/19 1647 10/23/19 2135  GLUCAP 395* 278* 257* 242* 257*   HbA1C: Recent Labs    10/23/19 0359  HGBA1C 9.8*   Urine analysis:    Component Value Date/Time   COLORURINE YELLOW 10/22/2019 1508   APPEARANCEUR HAZY (A) 10/22/2019 1508   LABSPEC 1.017 10/22/2019 1508   PHURINE 5.0 10/22/2019 1508   GLUCOSEU >=500 (A) 10/22/2019 1508   HGBUR SMALL (A) 10/22/2019 1508   BILIRUBINUR NEGATIVE 10/22/2019 1508   BILIRUBINUR negative 03/26/2014 1212   KETONESUR 5 (A) 10/22/2019 1508   PROTEINUR NEGATIVE 10/22/2019 1508  UROBILINOGEN 0.2 06/13/2014 1048   NITRITE NEGATIVE 10/22/2019 1508   LEUKOCYTESUR NEGATIVE 10/22/2019 1508   Sepsis Labs: _0 (procalcitonin:4,lacticidven:4) ) Recent Results (from the past 240 hour(s))  Urine culture     Status: Abnormal   Collection Time: 10/22/19  3:08 PM   Specimen: In/Out Cath Urine  Result Value Ref Range Status   Specimen Description   Final    IN/OUT CATH URINE Performed at [facility], [address], [place], [place] [address]    Special Requests   Final    NONE Performed at [facility], [address], [place], [place] [address]    Culture (A)  Final    <10,000 COLONIES/mL INSIGNIFICANT GROWTH Performed at [facility], [place] [address] [place], [place] [address]    Report Status 10/23/2019 FINAL  Final  Blood Culture (routine x 2)     Status: None (Preliminary result)   Collection Time: 10/22/19  3:20 PM   Specimen: BLOOD LEFT WRIST  Result Value Ref Range Status   Specimen Description   Final    BLOOD LEFT WRIST Performed at [facility], [address] [place], [place] [address]    Special Requests   Final    BOTTLES DRAWN AEROBIC AND ANAEROBIC Blood Culture adequate volume Performed at [facility], [address] [place], [place] [address]    Culture  Setup Time   Final    GRAM POSITIVE COCCI BOTH ANAEROBIC AND AEROBIC BOTTLES Gram Stain Report Called to,Read Back By and Verified With: HEARN,J_1  BY MATTHEWS, B 12.17.2020 [facility] Performed at [facility], [address] [place], [place] [address]    Culture   Final    CULTURE REINCUBATED FOR BETTER GROWTH Performed at [facility], [place] [address] [place], [place] [address]    Report Status PENDING  Incomplete  Blood Culture (routine x 2)     Status: None (Preliminary result)   Collection Time: 10/22/19  3:31 PM   Specimen: Right Antecubital; Blood  Result Value Ref Range Status   Specimen Description   Final    RIGHT ANTECUBITAL Performed at [facility], [address], [place], [place] [address]    Special Requests   Final    BOTTLES DRAWN AEROBIC AND ANAEROBIC Blood Culture adequate volume Performed at [facility], [address] [place], [place] [address]    Culture  Setup Time   Final    GRAM POSITIVE COCCI ANAEROBIC AND AEROBIC BOTTLES Gram Stain Report Called to,Read Back By and Verified With: HEARN,J_2  BY MATTHEWS B 12.17.2020 [facility] Performed at [facility], [address] [place],  [address]    Culture   Final    CULTURE REINCUBATED FOR  BETTER GROWTH Performed at [facility], [place] [address] [place],  [address]    Report Status PENDING  Incomplete  SARS CORONAVIRUS 2 (Adrena Nakamura 6-24 HRS) Nasopharyngeal Nasopharyngeal Swab     Status: None   Collection Time: 10/22/19  4:14 PM   Specimen: Nasopharyngeal Swab  Result Value Ref Range Status   SARS Coronavirus 2 NEGATIVE NEGATIVE Final    Comment: (NOTE) SARS-CoV-2 target nucleic acids are NOT DETECTED. The SARS-CoV-2 RNA is generally detectable in upper and lower respiratory specimens during the acute phase of infection. Negative results do not preclude SARS-CoV-2 infection, do not rule out co-infections with other pathogens, and should not be used as the sole basis for treatment or other patient management decisions. Negative results must be combined  with clinical observations, patient history, and epidemiological information. The expected result is Negative. Fact Sheet for Patients: SugarRoll.be Fact Sheet for Healthcare Providers: https://www.woods-mathews.com/ This test is not yet approved or cleared by the [place] FDA and  has been authorized for detection and/or diagnosis of SARS-CoV-2 by FDA under an Emergency Use Authorization (EUA). This EUA will remain  in effect (meaning this test can be used) for the duration of the COVID-19 declaration under Section 56 4(b)(1) of the Act, 21 U.S.C. section 360bbb-3(b)(1), unless the authorization is terminated or revoked sooner. Performed at [facility], [place] [address], [place], [place] [address]   MRSA PCR Screening     Status: None   Collection Time: 10/22/19  8:11 PM   Specimen: Nasal Mucosa; Nasopharyngeal  Result Value Ref Range Status   MRSA by PCR NEGATIVE NEGATIVE Final    Comment:        The GeneXpert MRSA Assay (FDA approved for NASAL specimens only), is one component of a comprehensive MRSA colonization surveillance program. It is  not intended to diagnose MRSA infection nor to guide or monitor treatment for MRSA infections. Performed at [facility], [address] [place], [place] [address]   C difficile quick scan w PCR reflex     Status: None   Collection Time: 10/22/19 11:56 PM   Specimen: STOOL  Result Value Ref Range Status   C Diff antigen NEGATIVE NEGATIVE Final   C Diff toxin NEGATIVE NEGATIVE Final   C Diff interpretation No C. difficile detected.  Final    Comment: Performed at [facility], [address] [place], [place] [address]  Culture, blood (routine x 2)     Status: None (Preliminary result)   Collection Time: 10/23/19  8:44 AM   Specimen: BLOOD RIGHT HAND  Result Value Ref Range Status   Specimen Description BLOOD RIGHT HAND  Final   Special Requests   Final    BOTTLES DRAWN AEROBIC AND ANAEROBIC Blood Culture adequate volume   Culture   Final    NO GROWTH < 24 HOURS Performed at [facility], [address] [place], [place] [address]    Report Status PENDING  Incomplete  Culture, blood (routine x 2)     Status: None (Preliminary result)   Collection Time: 10/23/19  8:46 AM   Specimen: BLOOD RIGHT HAND  Result Value Ref Range Status   Specimen Description BLOOD RIGHT HAND  Final   Special Requests   Final    BOTTLES DRAWN AEROBIC AND ANAEROBIC Blood Culture results may not be optimal due to an excessive volume of blood received in culture bottles   Culture   Final    NO GROWTH < 24 HOURS Performed at [facility], [address], [place], [place] [address]    Report Status PENDING  Incomplete     Scheduled Meds: . Chlorhexidine Gluconate Cloth  6 each Topical Q0600  . insulin aspart  0-15 Units Subcutaneous TID WC  . insulin aspart  0-5 Units Subcutaneous QHS  . insulin glargine  20 Units Subcutaneous QHS  . ondansetron (ZOFRAN) IV  4 mg Intravenous Q6H  . sodium chloride flush  3 mL Intravenous Q12H   Continuous Infusions: . amiodarone 30 mg/hr (10/24/19 0800)  . diltiazem  (CARDIZEM) infusion 10 mg/hr (10/24/19 0800)  . lactated ringers with KCl 20 mEq/L 125 mL/hr at 10/24/19 0800  . vancomycin Stopped (10/24/19 0737)    Procedures/Studies: DG Tibia/Fibula Right  Result Date: 10/22/2019 CLINICAL DATA:  Right leg pain and tenderness. Fever. Clinical evidence of infection. Diabetes. EXAM: RIGHT TIBIA AND FIBULA - 2 VIEW COMPARISON:  Right ankle radiographs obtained at the same time. FINDINGS: Mild diffuse soft tissue swelling. Previously described chronic appearing periosteal reaction involving the distal tibia. On these radiographs, this is involving the medial and posterior aspects of the tibia. Also again noted are previously described bone collapse and fragmentation involving the talotibial joint and flattening of the normal plantar arch. No soft tissue gas is seen. Mild medial and lateral spur formation at the knee joint with mild to moderate medial joint space narrowing. IMPRESSION: 1. Chronic appearing periosteal reaction involving the posterior and medial aspects of the distal tibia. Underlying chronic osteomyelitis is a possibility. 2. Diffuse soft tissue swelling without gas or bone destruction. 3. 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 Electronically Signed   By: Claudie Revering M.D.   On: 10/22/2019 16:05   DG Ankle Complete Right  Result Date: 10/22/2019 CLINICAL DATA:  Right leg pain and tenderness and fever with clinical evidence of infection. Diabetes. EXAM: RIGHT ANKLE - COMPLETE 3+ VIEW COMPARISON:  Right foot radiographs obtained today and on 06/19/2016. FINDINGS: Diffuse soft tissue swelling. Bone collapse and fragmentation at the talotibial joint, described on the foot radiographs, in addition to previously described flattening of the normal plantar arch. Again demonstrated are is chronic appearing posterior periosteal reaction involving the distal tibia. No bone destruction or soft tissue gas seen. IMPRESSION: 1. Diffuse soft tissue  swelling without underlying changes of acute osteomyelitis. 2. Neuropathic changes at the talotibial joint. 3. Pes planus. Electronically Signed   By: Claudie Revering M.D.   On: 10/22/2019 16:02   MR FOOT RIGHT W WO CONTRAST  Result Date: 10/23/2019 CLINICAL DATA:  Nonhealing ulcer involving the third toe. History of prior amputations. EXAM: MRI OF THE RIGHT FOREFOOT WITHOUT AND WITH CONTRAST TECHNIQUE: Multiplanar, multisequence MR imaging of the right foot was performed before and after the administration of intravenous contrast. CONTRAST:  47m GADAVIST GADOBUTROL 1 MMOL/ML IV SOLN COMPARISON:  Radiographs 10/22/2019 FINDINGS: Abnormal T1 and T2 signal intensity in the proximal phalanx of the third toe and also in what appears to be a small residual portion of the middle phalanx. There is also associated abnormal contrast enhancement and surrounding cellulitis. Findings consistent with osteomyelitis. No other definite sites of acute osteomyelitis are identified. Evidence of prior amputations involving the first and fifth toes and parts of the other toes. The metatarsal bones are intact. No findings for septic arthritis involving the remaining MTP joints or involving the midfoot joint spaces. There is moderate edema like signal abnormality in the navicular bone along with some enhancement. Could not exclude osteomyelitis. Severe diffuse cellulitis involving the entire midfoot and forefoot. Mild diffuse myositis without findings for pyomyositis. IMPRESSION: 1. Osteomyelitis involving the third digit as detailed above. 2. Abnormal signal intensity and enhancement in the navicular bone. This is not completely covered but could not exclude osteomyelitis. 3. Severe diffuse cellulitis and mild diffuse myositis. Electronically Signed   By: PMarijo SanesM.D.   On: 10/23/2019 16:50   DG Chest Port 1 View  Result Date: 10/22/2019 CLINICAL DATA:  Fever EXAM: PORTABLE CHEST 1 VIEW COMPARISON:  12/09/2013 chest  radiograph. FINDINGS: Stable cardiomediastinal silhouette with top-normal heart size. No pneumothorax. No pleural effusion. Lungs appear clear, with no acute consolidative airspace disease and no pulmonary edema. IMPRESSION: No active disease. Electronically Signed   By: JJanina MayoD.  On: 10/22/2019 15:55   DG Foot Complete Right  Result Date: 10/22/2019 CLINICAL DATA:  Right foot pain and fever. Diabetes. Clinical infection. EXAM: RIGHT FOOT COMPLETE - 3+ VIEW COMPARISON:  06/19/2016. FINDINGS: Diffuse soft tissue swelling. Again demonstrated surgical absence of the right great toe with interval surgical absence of the right little toe. Well-defined bone margins. No periosteal reaction or bone destruction involving the bones of the foot. There is flattening of the normal plantar arch and interval partial bone collapse and fragmentation involving the distal tibia and talus. There is also chronic appearing periosteal thickening and irregularity involving the distal tibia posteriorly. IMPRESSION: 1. Diffuse soft tissue swelling without soft tissue gas or evidence of underlying osteomyelitis. 2. Pes planus. 3. 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 Electronically Signed   By: Claudie Revering M.D.   On: 10/22/2019 15:59   ECHOCARDIOGRAM COMPLETE  Result Date: 10/23/2019   ECHOCARDIOGRAM REPORT   Patient Name:   TARL CEPHAS Date of Exam: 10/23/2019 Medical Rec #:  [id number]     Height:       76.0 in Accession #:    [id number]    Weight:       206.8 lb Date of Birth:  1966-10-16    BSA:          2.25 m Patient Age:    16 years      BP:           99/54 mmHg Patient Gender: M             HR:           120 bpm. Exam Location:  [facility] Procedure: 2D Echo, Cardiac Doppler and Color Doppler Indications:    Atrial Fibrillation 427.31 / I48.91  History:        Patient has no prior history of Echocardiogram examinations.                 Arrythmias:Atrial Fibrillation;  Risk Factors:Diabetes and                 Hypertension.  Sonographer:    Alvino Chapel RCS Referring Phys: [phone] Michaeleen Down IMPRESSIONS  1. Left ventricular ejection fraction, by visual estimation, is 70 to 75%. The left ventricle has hyperdynamic function. There is mildly increased left ventricular hypertrophy.  2. Left ventricular diastolic parameters are indeterminate.  3. The left ventricle has no regional wall motion abnormalities.  4. Global right ventricle has normal systolic function.The right ventricular size is normal. No increase in right ventricular wall thickness.  5. Left atrial size was normal.  6. Right atrial size was normal.  7. The mitral valve is grossly normal. Trivial mitral valve regurgitation.  8. The tricuspid valve is grossly normal. Tricuspid valve regurgitation is trivial.  9. The aortic valve is tricuspid. Aortic valve regurgitation is not visualized. 10. The pulmonic valve was grossly normal. Pulmonic valve regurgitation is not visualized. 11. Mildly elevated pulmonary artery systolic pressure. 12. The tricuspid regurgitant velocity is 3.04 m/s, and with an assumed right atrial pressure of 3 mmHg, the estimated right ventricular systolic pressure is mildly elevated at 40.0 mmHg. 13. The inferior vena cava is normal in size with greater than 50% respiratory variability, suggesting right atrial pressure of 3 mmHg. FINDINGS  Left Ventricle: Left ventricular ejection fraction, by visual estimation, is 70 to 75%. The left ventricle has hyperdynamic function. The left ventricle has no regional wall motion abnormalities. The left ventricular  internal cavity size was the left ventricle is normal in size. There is mildly increased left ventricular hypertrophy. Septal left ventricular hypertrophy. Left ventricular diastolic parameters are indeterminate. Right Ventricle: The right ventricular size is normal. No increase in right ventricular wall thickness. Global RV systolic function is has normal  systolic function. The tricuspid regurgitant velocity is 3.04 m/s, and with an assumed right atrial pressure  of 3 mmHg, the estimated right ventricular systolic pressure is mildly elevated at 40.0 mmHg. Left Atrium: Left atrial size was normal in size. Right Atrium: Right atrial size was normal in size Pericardium: There is no evidence of pericardial effusion. Mitral Valve: The mitral valve is grossly normal. Trivial mitral valve regurgitation. Tricuspid Valve: The tricuspid valve is grossly normal. Tricuspid valve regurgitation is trivial. Aortic Valve: The aortic valve is tricuspid. Aortic valve regurgitation is not visualized. Pulmonic Valve: The pulmonic valve was grossly normal. Pulmonic valve regurgitation is not visualized. Pulmonic regurgitation is not visualized. Aorta: The aortic root is normal in size and structure. Venous: The inferior vena cava is normal in size with greater than 50% respiratory variability, suggesting right atrial pressure of 3 mmHg. IAS/Shunts: No atrial level shunt detected by color flow Doppler.  LEFT VENTRICLE PLAX 2D LVIDd:         4.44 cm LVIDs:         2.56 cm LV PW:         1.00 cm LV IVS:        1.26 cm LVOT diam:     2.00 cm LV SV:         66 ml LV SV Index:   29.33 LVOT Area:     3.14 cm  RIGHT VENTRICLE TAPSE (M-mode): 1.7 cm LEFT ATRIUM             Index       RIGHT ATRIUM           Index LA diam:        3.30 cm 1.47 cm/m  RA Area:     20.60 cm LA Vol (A2C):   52.0 ml 23.14 ml/m RA Volume:   57.20 ml  25.46 ml/m LA Vol (A4C):   40.9 ml 18.20 ml/m LA Biplane Vol: 47.2 ml 21.01 ml/m  AORTIC VALVE LVOT Vmax:   129.00 cm/s LVOT Vmean:  82.933 cm/s LVOT VTI:    0.185 m  AORTA Ao Root diam: 3.30 cm MITRAL VALVE                        TRICUSPID VALVE MV Area (PHT): 3.21 cm             TR Peak grad:   37.0 mmHg MV PHT:        68.44 msec           TR Vmax:        304.00 cm/s MV Decel Time: 236 msec MV E velocity: 107.00 cm/s 103 cm/s SHUNTS                                      Systemic VTI:  0.19 m                                     Systemic Diam: 2.00 cm  Rozann Lesches MD Electronically  signed by Rozann Lesches MD Signature Date/Time: 10/23/2019/9:39:01 AM    Final     Orson Eva, DO  [facility] Pager [phone]  If 7PM-7AM, please contact night-coverage [email or web address] Password [email or web address] 10/24/2019, 10:55 AM   LOS: 2 days

## 2019-10-24 NOTE — Transfer of Care (Signed)
Immediate Anesthesia Transfer of Care Note  Patient: [name]  Procedure(s) Performed: PARTIAL THIRD RAY AMPUTATION (Right )  Patient Location: PACU  Anesthesia Type:MAC  Level of Consciousness: awake, alert , oriented and patient cooperative  Airway & Oxygen Therapy: Patient Spontanous Breathing  Post-op Assessment: Report given to RN and Post -op Vital signs reviewed and stable  Post vital signs: Reviewed and stable  Last Vitals:  Vitals Value Taken Time  BP    Temp    Pulse 110 10/24/19 1448  Resp    SpO2 94 % 10/24/19 1448  Vitals shown include unvalidated device data.  Last Pain:  Vitals:   10/24/19 1122  TempSrc: Oral  PainSc: 0-No pain         Complications: No apparent anesthesia complications

## 2019-10-24 NOTE — Anesthesia Postprocedure Evaluation (Signed)
Anesthesia Post Note  Patient: CIPRIANO MILLIKAN  Procedure(s) Performed: PARTIAL THIRD RAY AMPUTATION (Right )  Patient location during evaluation: PACU Anesthesia Type: MAC Level of consciousness: awake and alert, awake and oriented Pain management: satisfactory to patient Vital Signs Assessment: post-procedure vital signs reviewed and stable Respiratory status: spontaneous breathing, respiratory function stable and nonlabored ventilation Cardiovascular status: stable Postop Assessment: no apparent nausea or vomiting Anesthetic complications: no     Last Vitals:  Vitals:   10/24/19 1122 10/24/19 1204  BP: 122/77 124/77  Pulse: 86   Resp: (!) 28   Temp:    SpO2: 97%     Last Pain:  Vitals:   10/24/19 1122  TempSrc: Oral  PainSc: 0-No pain                 Maurice Ramseur

## 2019-10-24 NOTE — Progress Notes (Addendum)
Progress Note  Patient Name: [name] Date of Encounter: 10/24/2019  Consulting Cardiologist:  Nona Dell, MD  Subjective   Feels better, for surgery later this morning. No palps, CP or SOB.  Inpatient Medications    Scheduled Meds: . Chlorhexidine Gluconate Cloth  6 each Topical Q0600  . insulin aspart  0-15 Units Subcutaneous TID WC  . insulin aspart  0-5 Units Subcutaneous QHS  . insulin glargine  20 Units Subcutaneous QHS  . ondansetron (ZOFRAN) IV  4 mg Intravenous Q6H  . sodium chloride flush  3 mL Intravenous Q12H   Continuous Infusions: . amiodarone 30 mg/hr (10/24/19 0400)  . diltiazem (CARDIZEM) infusion 10 mg/hr (10/24/19 0400)  . lactated ringers with KCl 20 mEq/L 125 mL/hr at 10/24/19 0747  . vancomycin 1,000 mg (10/24/19 0532)   PRN Meds: acetaminophen **OR** acetaminophen, fentaNYL (SUBLIMAZE) injection, fluticasone, promethazine   Vital Signs    Vitals:   10/24/19 0500 10/24/19 0530 10/24/19 0600 10/24/19 0630  BP: 112/72 120/74 114/75 120/75  Pulse: 96 83 87 73  Resp: (!) 31 (!) 27 (!) 33 (!) 36  Temp:      TempSrc:      SpO2: 99% 96% 99% 97%  Weight: 94.1 kg     Height:        Intake/Output Summary (Last 24 hours) at 10/24/2019 0752 Last data filed at 10/24/2019 0500 Gross per 24 hour  Intake 4685.85 ml  Output 1450 ml  Net 3235.85 ml   Filed Weights   10/22/19 2110 10/23/19 0500 10/24/19 0500  Weight: 93.8 kg 93.8 kg 94.1 kg   Last Weight  Most recent update: 10/24/2019  6:36 AM   Weight  94.1 kg (207 lb 7.3 oz)           Weight change: -3.423 kg   Telemetry    Atrial fib, controlled VR - Personally Reviewed  Physical Exam   General: Well developed male appearing in no acute distress. Head: Normocephalic, atraumatic.  Neck: Supple without bruits, JVD not elevated. Lungs:  Resp regular and unlabored, CTA. Heart: Irregularly irregular without gallop. Abdomen: Soft, non-tender, non-distended with normoactive  bowel sounds. Extremities: No clubbing, cyanosis, trace RLE edema. Distal radial pulses are 2+ bilaterally. Neuro: Alert and oriented X 3. Moves all extremities spontaneously. Psych: Normal affect.  Labs    Hematology Recent Labs  Lab 10/22/19 1520 10/23/19 0359 10/24/19 0357  WBC 12.1* 7.7 8.0  RBC 5.24 4.44 4.10*  HGB 16.0 13.8 12.4*  HCT 46.1 39.0 36.0*  MCV 88.0 87.8 87.8  MCH 30.5 31.1 30.2  MCHC 34.7 35.4 34.4  RDW 11.8 11.9 12.0  PLT 176 123* 126*    Chemistry Recent Labs  Lab 10/22/19 1520 10/23/19 0359 10/24/19 0357  NA 125* 132* 131*  K 3.4* 3.3* 3.8  CL 89* 98 96*  CO2 21* 22 28  GLUCOSE 389* 266* 242*  BUN 40* 31* 25*  CREATININE 1.42* 0.97 0.87  CALCIUM 8.8* 8.5* 8.2*  PROT 7.5 6.0* 5.4*  ALBUMIN 3.4* 2.6* 2.1*  AST ALT ALKPHOS 123 83 69  BILITOT 2.0* 1.6* 1.3*  GFRNONAA 55* >60 >60  GFRAA >60 >60 >60  [facility] Radiology    DG Tibia/Fibula Right  Result Date: 10/22/2019 CLINICAL DATA:  Right leg pain and tenderness. Fever. Clinical evidence of infection. Diabetes. EXAM: RIGHT TIBIA AND FIBULA - 2 VIEW COMPARISON:  Right ankle radiographs obtained  at the same time. FINDINGS: Mild diffuse soft tissue swelling. Previously described chronic appearing periosteal reaction involving the distal tibia. On these radiographs, this is involving the medial and posterior aspects of the tibia. Also again noted are previously described bone collapse and fragmentation involving the talotibial joint and flattening of the normal plantar arch. No soft tissue gas is seen. Mild medial and lateral spur formation at the knee joint with mild to moderate medial joint space narrowing. IMPRESSION: 1. Chronic appearing periosteal reaction involving the posterior and medial aspects of the distal tibia. Underlying chronic osteomyelitis is a possibility. 2. Diffuse soft tissue swelling without gas or bone destruction. 3. Neuropathic changes at the  talotibial joint with pes planus. Electronically Signed   By: Beckie Salts M.D.   On: 10/22/2019 16:05   DG Ankle Complete Right  Result Date: 10/22/2019 CLINICAL DATA:  Right leg pain and tenderness and fever with clinical evidence of infection. Diabetes. EXAM: RIGHT ANKLE - COMPLETE 3+ VIEW COMPARISON:  Right foot radiographs obtained today and on 06/19/2016. FINDINGS: Diffuse soft tissue swelling. Bone collapse and fragmentation at the talotibial joint, described on the foot radiographs, in addition to previously described flattening of the normal plantar arch. Again demonstrated are is chronic appearing posterior periosteal reaction involving the distal tibia. No bone destruction or soft tissue gas seen. IMPRESSION: 1. Diffuse soft tissue swelling without underlying changes of acute osteomyelitis. 2. Neuropathic changes at the talotibial joint. 3. Pes planus. Electronically Signed   By: Beckie Salts M.D.   On: 10/22/2019 16:02   MR FOOT RIGHT W WO CONTRAST  Result Date: 10/23/2019 CLINICAL DATA:  Nonhealing ulcer involving the third toe. History of prior amputations. EXAM: MRI OF THE RIGHT FOREFOOT WITHOUT AND WITH CONTRAST TECHNIQUE: Multiplanar, multisequence MR imaging of the right foot was performed before and after the administration of intravenous contrast. CONTRAST:  10mL GADAVIST GADOBUTROL 1 MMOL/ML IV SOLN COMPARISON:  Radiographs 10/22/2019 FINDINGS: Abnormal T1 and T2 signal intensity in the proximal phalanx of the third toe and also in what appears to be a small residual portion of the middle phalanx. There is also associated abnormal contrast enhancement and surrounding cellulitis. Findings consistent with osteomyelitis. No other definite sites of acute osteomyelitis are identified. Evidence of prior amputations involving the first and fifth toes and parts of the other toes. The metatarsal bones are intact. No findings for septic arthritis involving the remaining MTP joints or involving  the midfoot joint spaces. There is moderate edema like signal abnormality in the navicular bone along with some enhancement. Could not exclude osteomyelitis. Severe diffuse cellulitis involving the entire midfoot and forefoot. Mild diffuse myositis without findings for pyomyositis. IMPRESSION: 1. Osteomyelitis involving the third digit as detailed above. 2. Abnormal signal intensity and enhancement in the navicular bone. This is not completely covered but could not exclude osteomyelitis. 3. Severe diffuse cellulitis and mild diffuse myositis. Electronically Signed   By: Rudie Meyer M.D.   On: 10/23/2019 16:50   DG Chest Port 1 View  Result Date: 10/22/2019 CLINICAL DATA:  Fever EXAM: PORTABLE CHEST 1 VIEW COMPARISON:  12/09/2013 chest radiograph. FINDINGS: Stable cardiomediastinal silhouette with top-normal heart size. No pneumothorax. No pleural effusion. Lungs appear clear, with no acute consolidative airspace disease and no pulmonary edema. IMPRESSION: No active disease. Electronically Signed   By: Delbert Phenix M.D.   On: 10/22/2019 15:55   DG Foot Complete Right  Result Date: 10/22/2019 CLINICAL DATA:  Right foot pain and fever. Diabetes. Clinical infection.  EXAM: RIGHT FOOT COMPLETE - 3+ VIEW COMPARISON:  06/19/2016. FINDINGS: Diffuse soft tissue swelling. Again demonstrated surgical absence of the right great toe with interval surgical absence of the right little toe. Well-defined bone margins. No periosteal reaction or bone destruction involving the bones of the foot. There is flattening of the normal plantar arch and interval partial bone collapse and fragmentation involving the distal tibia and talus. There is also chronic appearing periosteal thickening and irregularity involving the distal tibia posteriorly. IMPRESSION: 1. Diffuse soft tissue swelling without soft tissue gas or evidence of underlying osteomyelitis. 2. Pes planus. 3. Neuropathic changes at the talotibial joint with bone  collapse and fragmentation. Electronically Signed   By: Beckie SaltsSteven  Reid M.D.   On: 10/22/2019 15:59   ECHOCARDIOGRAM COMPLETE  Result Date: 10/23/2019   ECHOCARDIOGRAM REPORT   Patient Name:   [name] Date of Exam: 10/23/2019 Medical Rec #:  [id number]     Height:       76.0 in Accession #:    [id number]    Weight:       206.8 lb Date of Birth:  1966-01-17    BSA:          2.25 m Patient Age:    53 years      BP:           99/54 mmHg Patient Gender: M             HR:           120 bpm. [facility] Procedure: 2D Echo, Cardiac Doppler and Color Doppler Indications:    Atrial Fibrillation 427.31 / I48.91  History:        Patient has no prior history of Echocardiogram examinations.                 Arrythmias:Atrial Fibrillation; Risk Factors:Diabetes and                 Hypertension.  Sonographer:    Celesta GentileBernard White RCS Referring Phys: [phone] DAVID TAT IMPRESSIONS  1. Left ventricular ejection fraction, by visual estimation, is 70 to 75%. The left ventricle has hyperdynamic function. There is mildly increased left ventricular hypertrophy.  2. Left ventricular diastolic parameters are indeterminate.  3. The left ventricle has no regional wall motion abnormalities.  4. Global right ventricle has normal systolic function.The right ventricular size is normal. No increase in right ventricular wall thickness.  5. Left atrial size was normal.  6. Right atrial size was normal.  7. The mitral valve is grossly normal. Trivial mitral valve regurgitation.  8. The tricuspid valve is grossly normal. Tricuspid valve regurgitation is trivial.  9. The aortic valve is tricuspid. Aortic valve regurgitation is not visualized. 10. The pulmonic valve was grossly normal. Pulmonic valve regurgitation is not visualized. 11. Mildly elevated pulmonary artery systolic pressure. 12. The tricuspid regurgitant velocity is 3.04 m/s, and with an assumed right atrial pressure of 3 mmHg, the estimated right ventricular systolic  pressure is mildly elevated at 40.0 mmHg. 13. The inferior vena cava is normal in size with greater than 50% respiratory variability, suggesting right atrial pressure of 3 mmHg. FINDINGS  Left Ventricle: Left ventricular ejection fraction, by visual estimation, is 70 to 75%. The left ventricle has hyperdynamic function. The left ventricle has no regional wall motion abnormalities. The left ventricular internal cavity size was the left ventricle is normal in size. There is mildly increased left ventricular hypertrophy. Septal left ventricular hypertrophy. Left  ventricular diastolic parameters are indeterminate. Right Ventricle: The right ventricular size is normal. No increase in right ventricular wall thickness. Global RV systolic function is has normal systolic function. The tricuspid regurgitant velocity is 3.04 m/s, and with an assumed right atrial pressure  of 3 mmHg, the estimated right ventricular systolic pressure is mildly elevated at 40.0 mmHg. Left Atrium: Left atrial size was normal in size. Right Atrium: Right atrial size was normal in size Pericardium: There is no evidence of pericardial effusion. Mitral Valve: The mitral valve is grossly normal. Trivial mitral valve regurgitation. Tricuspid Valve: The tricuspid valve is grossly normal. Tricuspid valve regurgitation is trivial. Aortic Valve: The aortic valve is tricuspid. Aortic valve regurgitation is not visualized. Pulmonic Valve: The pulmonic valve was grossly normal. Pulmonic valve regurgitation is not visualized. Pulmonic regurgitation is not visualized. Aorta: The aortic root is normal in size and structure. Venous: The inferior vena cava is normal in size with greater than 50% respiratory variability, suggesting right atrial pressure of 3 mmHg. IAS/Shunts: No atrial level shunt detected by color flow Doppler.  LEFT VENTRICLE PLAX 2D LVIDd:         4.44 cm LVIDs:         2.56 cm LV PW:         1.00 cm LV IVS:        1.26 cm LVOT diam:     2.00 cm  LV SV:         66 ml LV SV Index:   29.33 LVOT Area:     3.14 cm  RIGHT VENTRICLE TAPSE (M-mode): 1.7 cm LEFT ATRIUM             Index       RIGHT ATRIUM           Index LA diam:        3.30 cm 1.47 cm/m  RA Area:     20.60 cm LA Vol (A2C):   52.0 ml 23.14 ml/m RA Volume:   57.20 ml  25.46 ml/m LA Vol (A4C):   40.9 ml 18.20 ml/m LA Biplane Vol: 47.2 ml 21.01 ml/m  AORTIC VALVE LVOT Vmax:   129.00 cm/s LVOT Vmean:  82.933 cm/s LVOT VTI:    0.185 m  AORTA Ao Root diam: 3.30 cm MITRAL VALVE                        TRICUSPID VALVE MV Area (PHT): 3.21 cm             TR Peak grad:   37.0 mmHg MV PHT:        68.44 msec           TR Vmax:        304.00 cm/s MV Decel Time: 236 msec MV E velocity: 107.00 cm/s 103 cm/s SHUNTS                                     Systemic VTI:  0.19 m                                     Systemic Diam: 2.00 cm  Nona Dell MD Electronically signed by Nona Dell MD Signature Date/Time: 10/23/2019/9:39:01 AM    Final     Patient Profile     53  y.o. male w/ hx poorly controlled DM, HTN, periph neuropathy, diabetic foot ulcers s/p L BKA, Charcot foot deformity, who was admitted 12/16 with LE cellulitis, sepsis and rapid Afib. Cards saw for atrial fib and to eval preop surgical risk   Assessment & Plan    1. Atrial fib, RVR - rate control good on amio 30 mg/hr and Dilt 10 mg/hr, continue IV for now, consider change to po rx after surgery - BP tolerating well. - was on heparin for anticoag, stopped for surgery today - CHA2DS2-VASc = 2 (DM, HTN) - since both atria are normal in size, he may convert spontaneously - If not, once surgery is over and pt otherwise improved, MD advise on TEE/DCCV  2. Sepsis - 2nd osteomyelitis R 3 rd toe and cellulitis R foot w/ Charcot foot - Dr [name] has seen, plans for amputation of R 3rd toe/partial 3rd ray ampuation today  3. AKI - renal function improving since admission - agree w/ continued hydration since urine is still very  dark, appears concentrated   Jonetta Speak , PA-C 7:52 AM 10/24/2019 Pager: [phone]   Attending note:  Patient seen and examined.  Chart reviewed and case discussed with Ms. Ahmed Prima PA-C.  [name] is currently afebrile.  MRI of the right foot yesterday was consistent with osteomyelitis involving the third digit and navicular bone.  Also severe diffuse cellulitis and mild diffuse myositis.  Surgery is anticipated later this morning.  On examination he is in no obvious distress.  Afebrile.  Heart rate is in the 80s to 90s in atrial fibrillation on intravenous Cardizem and amiodarone.  Systolic blood pressure stable in the 115-125 range.  Lungs are clear.  Cardiac exam reveals irregularly irregular rhythm without gallop.  Lab work shows potassium 3.8, BUN 25, creatinine 0.87, normal AST and ALT, hemoglobin 12.4, platelets 126.  Echocardiogram demonstrates vigorous LVEF in the range of 70 to 75%, normal RV contraction with mildly elevated PASP at 40 mmHg.  Would plan to continue intravenous Cardizem and amiodarone in the perioperative setting.  Heparin discontinued in anticipation of surgery.  Once stable postoperatively (24-48 hours), consider change to oral AV nodal blocker and amiodarone, hopefully he will convert back to sinus rhythm as clinical status further improves.  Anticipate initiation of Eliquis rather than heparin postoperatively as well.  If he remains in atrial fibrillation with otherwise improved condition, suggest TEE cardioversion next week prior to discharge.  Satira Sark, M.D., F.A.C.C.

## 2019-10-24 NOTE — Op Note (Signed)
OPERATIVE NOTE  DATE OF PROCEDURE 10/24/2019  SURGEON Marcheta Grammes, DPM  ASSISTANT SURGEON None  OR STAFF Circulator: Brim, Fransisco Hertz, RN Scrub Person: Karin Lieu, CST RN First Assistant: Cipriano Bunker, RN   PREOPERATIVE DIAGNOSIS 1.  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  Ulceration of 3rd toe, right foot 3.  Diabetes mellitus with peripheral neuropathy  POSTOPERATIVE DIAGNOSIS Same  PROCEDURE Partial 3rd ray amputation, right foot  ANESTHESIA Monitor Anesthesia Care   HEMOSTASIS Pneumatic ankle tourniquet set at 250 mmHg  ESTIMATED BLOOD LOSS ~50 cc  MATERIALS USED Packing gauze  INJECTABLES 0.5% Marcaine plain  PATHOLOGY Specimen 1:  Soft tissue and bone from 3rd toe right foot to microbiology Specimen 2:  3rd toe and 3rd metatarsal head right foot to pathology Specimen 3:  Wafer of bone from 3rd metatarsal head right foot to pathlolgy  COMPLICATIONS None  INDICATIONS:  Ulceration of the right third toe with underlying osteomyelitis confirmed on MRI.  DESCRIPTION OF THE PROCEDURE:  The patient was brought to the operating room and placed on the operative table in the supine position.  A pneumatic ankle tourniquet was applied to the operative extremity.  Following sedation, the surgical site was anesthetized with 0.5% Marcaine plain.  The foot was then prepped, scrubbed, and draped in the usual sterile technique.    Attention was directed to the right foot where 2 converging semielliptical incisions were made encompassing the third toe circumferentially.  Dissection was continued deep down to the level of the third metatarsophalangeal joint.  The joint was disarticulated and the third toe passed from the field.  A portion of soft tissue from the distal aspect of the right third toe and underlying bone were sent to microbiology for culture.  The ankle tourniquet was inflated to 219mmHg to allow for more optimal visualization of the surgical  field.  Attention was redirected to the dorsal aspect of the right foot where the incision was extended proximally over the third metatarsal.  Dissection was continued deep down to the level of the third metatarsal.  All bleeders were cauterized as necessary.  Using a power bone saw, a through and through osteotomy was performed proximal to the surgical neck.  The head of the third metatarsal was freed of all soft tissue attachments, removed and passed from the operative field.  The right third toe and right third metatarsal head were sent to pathology as 1 specimen.  The surgical wound was irrigated with copious amounts of sterile irrigant.  Using a power bone saw, a second through and through osteotomy was performed proximal and parallel to the initial osteotomy creating a small wafer of bone.  The wafer of bone was freed of all soft tissue attachments, removed and sent to pathology for margins.  The surgical wound was irrigated with copious amounts of sterile irrigant.  The proximal and distal aspects of the incision were closed using 3-0 Prolene in a simple suture technique.  The central aspect of the surgical wound was left open and packed with packing gauze.  A sterile compressive dressing was applied to the right foot.  The pneumatic ankle tourniquet was deflated and a prompt hyperemic response was noted to all remaining digits of the right foot.     The patient tolerated the procedure well.  The patient was then transferred to PACU with vital signs stable and vascular status intact to all toes of the operative foot.

## 2019-10-24 NOTE — Progress Notes (Signed)
Patient's lactic acid 2.2, midlevel aware.

## 2019-10-24 NOTE — Progress Notes (Signed)
Pt taken to OR at this time.

## 2019-10-24 NOTE — Anesthesia Preprocedure Evaluation (Signed)
Anesthesia Evaluation  Patient identified by MRN, date of birth, ID band Patient awake    Reviewed: Allergy & Precautions, NPO status , Patient's Chart, lab work & pertinent test results  Airway Mallampati: I  TM Distance: >3 FB Neck ROM: Full    Dental no notable dental hx. (+) Teeth Intact   Pulmonary neg pulmonary ROS,    Pulmonary exam normal breath sounds clear to auscultation       Cardiovascular Exercise Tolerance: Good hypertension, Pt. on medications Normal cardiovascular exam+ dysrhythmias Atrial Fibrillation I Rhythm:Irregular Rate:Tachycardia  New onset Afib with RVR now rate controlled ~90s  Denies any other cardiac issues  States feels much improved with lower rate    Neuro/Psych negative neurological ROS  negative psych ROS   GI/Hepatic negative GI ROS, Neg liver ROS,   Endo/Other  negative endocrine ROSdiabetes, Type 1, Insulin Dependent  Renal/GU negative Renal ROS  negative genitourinary   Musculoskeletal  (+) Arthritis , Osteoarthritis,    Abdominal   Peds negative pediatric ROS (+)  Hematology negative hematology ROS (+)   Anesthesia Other Findings Here for Toe Amp - majorly neuropathic - no pain to palpation of infected area  Reproductive/Obstetrics negative OB ROS                             Anesthesia Physical Anesthesia Plan  ASA: III  Anesthesia Plan: MAC   Post-op Pain Management:    Induction: Intravenous  PONV Risk Score and Plan: 1 and Ondansetron, Midazolam and Treatment may vary due to age or medical condition  Airway Management Planned: Nasal Cannula and Simple Face Mask  Additional Equipment:   Intra-op Plan:   Post-operative Plan:   Informed Consent: I have reviewed the patients History and Physical, chart, labs and discussed the procedure including the risks, benefits and alternatives for the proposed anesthesia with the patient or  authorized representative who has indicated his/her understanding and acceptance.     Dental advisory given  Plan Discussed with: CRNA  Anesthesia Plan Comments: (Plan Full PPE use  Plan MAC with SR d/w pt -WTP with same after Q&A  Will change to GETA as needed  Voiced understanding WTP with above )        Anesthesia Quick Evaluation

## 2019-10-24 NOTE — Progress Notes (Addendum)
Inpatient Diabetes Program Recommendations  AACE/ADA: New Consensus Statement on Inpatient Glycemic Control (2015)  Target Ranges:  Prepandial:   less than 140 mg/dL      Peak postprandial:   less than 180 mg/dL (1-2 hours)      Critically ill patients:  140 - 180 mg/dL   Lab Results  Component Value Date   GLUCAP 257 (H) 10/23/2019   HGBA1C 9.8 (H) 10/23/2019    Review of Glycemic Control Results for [name], [name] (MRN [id number]) as of 10/24/2019 09:18  Ref. Range 10/22/2019 21:28 10/23/2019 11:59 10/23/2019 16:47 10/23/2019 21:35  Glucose-Capillary Latest Ref Range: 70 - 99 mg/dL 278 (H) 257 (H) 242 (H) 257 (H)   Diabetes history: DM2 Outpatient Diabetes medications: Toujeo 40-50 units qd Current orders for Inpatient glycemic control: lantus 20 units qd + Novolog moderate correction tid + hs 0-5 units  Inpatient Diabetes Program Recommendations:   Increase Lantus to 30 units qhs Will plan to speak with patient when stable.  Thanks, Bronson Curb, MSN, RNC-OB Diabetes Coordinator [phone] (8a-5p)

## 2019-10-24 NOTE — Progress Notes (Signed)
Podiatry Progress Note  Subjective Feeling better.  Has been NPO.  No nausea or vomiting.  Objective Vital signs in last 24 hours:   Temp:  [98.1 F (36.7 C)-100.4 F (38 C)] 98.1 F (36.7 C) (12/18 0400) Pulse Rate:  [73-108] 86 (12/18 1122) Resp:  [22-42] 28 (12/18 1122) BP: (104-133)/(59-78) 124/77 (12/18 1204) SpO2:  [90 %-100 %] 97 % (12/18 1122) Weight:  [94.1 kg] 94.1 kg (12/18 0500)  INTEGUMENT:  Ulcer distal R 3rd toe.  Wound is fibrotic.  Erythema of R 3rd toe extending to ankle.  Ulcer probes to bone.   VASCULAR:  Pedal pulses are palpable.  Edema of RLE. MUSCULOSKELETAL:  Status post BKA LLE.  Status post amputation of R hallux.  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   NEUROLOGIC:  Protective sensation of RLE diminished to lower leg.  Lab/Test Results  Recent Labs    10/23/19 0359 10/24/19 0357  WBC 7.7 8.0  HGB 13.8 12.4*  HCT 39.0 36.0*  PLT 123* 126*  NA 132* 131*  K 3.3* 3.8  CL 98 96*  CO2 22 28  BUN 31* 25*  CREATININE 0.97 0.87  GLUCOSE 266* 242*  CALCIUM 8.5* 8.2*    Recent Results (from the past 240 hour(s))  Urine culture     Status: Abnormal   Collection Time: 10/22/19  3:08 PM   Specimen: In/Out Cath Urine  Result Value Ref Range Status   Specimen Description   Final    IN/OUT CATH URINE Performed at [facility], [address] [place], [place] [address]    Special Requests   Final    NONE Performed at [facility], [address] [place], [place] [address]    Culture (A)  Final    <10,000 COLONIES/mL INSIGNIFICANT GROWTH Performed at [facility], [address] [place], [place] [address]    Report Status 10/23/2019 FINAL  Final  Blood Culture (routine x 2)     Status: None (Preliminary result)   Collection Time: 10/22/19  3:20 PM   Specimen: BLOOD LEFT WRIST  Result Value Ref Range Status   Specimen Description   Final    BLOOD LEFT WRIST Performed at [facility], [address] [place], [place] [address]    Special  Requests   Final    BOTTLES DRAWN AEROBIC AND ANAEROBIC Blood Culture adequate volume Performed at [facility], [address] [place], [place] [address]    Culture  Setup Time   Final    GRAM POSITIVE COCCI BOTH ANAEROBIC AND AEROBIC BOTTLES Gram Stain Report Called to,Read Back By and Verified With: HEARN,[email or web address]  BY MATTHEWS, B 12.17.2020 [facility] Performed at [facility], [address] [place], [place] [address]    Culture   Final    CULTURE REINCUBATED FOR BETTER GROWTH Performed at [facility], [address] [place], [place] [address]    Report Status PENDING  Incomplete  Blood Culture (routine x 2)     Status: None (Preliminary result)   Collection Time: 10/22/19  3:31 PM   Specimen: Right Antecubital; Blood  Result Value Ref Range Status   Specimen Description   Final    RIGHT ANTECUBITAL Performed at [facility], [address] [place], [place] [address]    Special Requests   Final    BOTTLES DRAWN AEROBIC AND ANAEROBIC Blood Culture adequate volume Performed at [facility], [address] [place], [place] [address]    Culture  Setup  Time   Final    GRAM POSITIVE COCCI ANAEROBIC AND AEROBIC BOTTLES Gram Stain Report Called to,Read Back By and Verified With: HEARN,[email or web address]  BY MATTHEWS B 12.17.2020 [facility] Performed at [facility], [address], [place], [place] [address]    Culture   Final    CULTURE REINCUBATED FOR BETTER GROWTH Performed at [facility], [place] [address], [place], [place] [address]    Report Status PENDING  Incomplete  SARS CORONAVIRUS 2 (TAT 6-24 HRS) Nasopharyngeal Nasopharyngeal Swab     Status: None   Collection Time: 10/22/19  4:14 PM   Specimen: Nasopharyngeal Swab  Result Value Ref Range Status   SARS Coronavirus 2 NEGATIVE NEGATIVE Final    Comment: (NOTE) SARS-CoV-2 target nucleic acids are NOT DETECTED. The SARS-CoV-2 RNA is generally detectable in upper and lower respiratory specimens during the  acute phase of infection. Negative results do not preclude SARS-CoV-2 infection, do not rule out co-infections with other pathogens, and should not be used as the sole basis for treatment or other patient management decisions. Negative results must be combined with clinical observations, patient history, and epidemiological information. The expected result is Negative. Fact Sheet for Patients: SugarRoll.be Fact Sheet for Healthcare Providers: https://www.woods-mathews.com/ This test is not yet approved or cleared by the [place] FDA and  has been authorized for detection and/or diagnosis of SARS-CoV-2 by FDA under an Emergency Use Authorization (EUA). This EUA will remain  in effect (meaning this test can be used) for the duration of the COVID-19 declaration under Section 56 4(b)(1) of the Act, 21 U.S.C. section 360bbb-3(b)(1), unless the authorization is terminated or revoked sooner. Performed at [facility], [place] [address] [place], [place] [address]   MRSA PCR Screening     Status: None   Collection Time: 10/22/19  8:11 PM   Specimen: Nasal Mucosa; Nasopharyngeal  Result Value Ref Range Status   MRSA by PCR NEGATIVE NEGATIVE Final    Comment:        The GeneXpert MRSA Assay (FDA approved for NASAL specimens only), is one component of a comprehensive MRSA colonization surveillance program. It is not intended to diagnose MRSA infection nor to guide or monitor treatment for MRSA infections. Performed at [facility], [address] [place], [place] [address]   C difficile quick scan w PCR reflex     Status: None   Collection Time: 10/22/19 11:56 PM   Specimen: STOOL  Result Value Ref Range Status   C Diff antigen NEGATIVE NEGATIVE Final   C Diff toxin NEGATIVE NEGATIVE Final   C Diff interpretation No C. difficile detected.  Final    Comment: Performed at [facility], [address] [place], [place] [address]  Culture,  blood (routine x 2)     Status: None (Preliminary result)   Collection Time: 10/23/19  8:44 AM   Specimen: BLOOD RIGHT HAND  Result Value Ref Range Status   Specimen Description BLOOD RIGHT HAND  Final   Special Requests   Final    BOTTLES DRAWN AEROBIC AND ANAEROBIC Blood Culture adequate volume   Culture   Final    NO GROWTH < 24 HOURS Performed at [facility], [address] [place], [place] [address]    Report Status PENDING  Incomplete  Culture, blood (routine x 2)     Status: None (Preliminary result)   Collection Time: 10/23/19  8:46 AM   Specimen: BLOOD RIGHT HAND  Result Value Ref Range Status  Specimen Description BLOOD RIGHT HAND  Final   Special Requests   Final    BOTTLES DRAWN AEROBIC AND ANAEROBIC Blood Culture results may not be optimal due to an excessive volume of blood received in culture bottles   Culture   Final    NO GROWTH < 24 HOURS Performed at [facility], [address] [place], [place] [address]    Report Status PENDING  Incomplete     DG Tibia/Fibula Right  Result Date: 10/22/2019 CLINICAL DATA:  Right leg pain and tenderness. Fever. Clinical evidence of infection. Diabetes. EXAM: RIGHT TIBIA AND FIBULA - 2 VIEW COMPARISON:  Right ankle radiographs obtained at the same time. FINDINGS: Mild diffuse soft tissue swelling. Previously described chronic appearing periosteal reaction involving the distal tibia. On these radiographs, this is involving the medial and posterior aspects of the tibia. Also again noted are previously described bone collapse and fragmentation involving the talotibial joint and flattening of the normal plantar arch. No soft tissue gas is seen. Mild medial and lateral spur formation at the knee joint with mild to moderate medial joint space narrowing. IMPRESSION: 1. Chronic appearing periosteal reaction involving the posterior and medial aspects of the distal tibia. Underlying chronic osteomyelitis is a possibility. 2. Diffuse soft tissue  swelling without gas or bone destruction. 3. 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 Electronically Signed   By: Beckie Salts M.D.   On: 10/22/2019 16:05   DG Ankle Complete Right  Result Date: 10/22/2019 CLINICAL DATA:  Right leg pain and tenderness and fever with clinical evidence of infection. Diabetes. EXAM: RIGHT ANKLE - COMPLETE 3+ VIEW COMPARISON:  Right foot radiographs obtained today and on 06/19/2016. FINDINGS: Diffuse soft tissue swelling. Bone collapse and fragmentation at the talotibial joint, described on the foot radiographs, in addition to previously described flattening of the normal plantar arch. Again demonstrated are is chronic appearing posterior periosteal reaction involving the distal tibia. No bone destruction or soft tissue gas seen. IMPRESSION: 1. Diffuse soft tissue swelling without underlying changes of acute osteomyelitis. 2. Neuropathic changes at the talotibial joint. 3. Pes planus. Electronically Signed   By: Beckie Salts M.D.   On: 10/22/2019 16:02   MR FOOT RIGHT W WO CONTRAST  Result Date: 10/23/2019 CLINICAL DATA:  Nonhealing ulcer involving the third toe. History of prior amputations. EXAM: MRI OF THE RIGHT FOREFOOT WITHOUT AND WITH CONTRAST TECHNIQUE: Multiplanar, multisequence MR imaging of the right foot was performed before and after the administration of intravenous contrast. CONTRAST:  10mL GADAVIST GADOBUTROL 1 MMOL/ML IV SOLN COMPARISON:  Radiographs 10/22/2019 FINDINGS: Abnormal T1 and T2 signal intensity in the proximal phalanx of the third toe and also in what appears to be a small residual portion of the middle phalanx. There is also associated abnormal contrast enhancement and surrounding cellulitis. Findings consistent with osteomyelitis. No other definite sites of acute osteomyelitis are identified. Evidence of prior amputations involving the first and fifth toes and parts of the other toes. The metatarsal bones are intact. No  findings for septic arthritis involving the remaining MTP joints or involving the midfoot joint spaces. There is moderate edema like signal abnormality in the navicular bone along with some enhancement. Could not exclude osteomyelitis. Severe diffuse cellulitis involving the entire midfoot and forefoot. Mild diffuse myositis without findings for pyomyositis. IMPRESSION: 1. Osteomyelitis involving the third digit as detailed above. 2. Abnormal signal intensity and enhancement in the navicular bone. This is not completely covered but could not exclude osteomyelitis.  3. Severe diffuse cellulitis and mild diffuse myositis. Electronically Signed   By: Rudie Meyer M.D.   On: 10/23/2019 16:50   DG Chest Port 1 View  Result Date: 10/22/2019 CLINICAL DATA:  Fever EXAM: PORTABLE CHEST 1 VIEW COMPARISON:  12/09/2013 chest radiograph. FINDINGS: Stable cardiomediastinal silhouette with top-normal heart size. No pneumothorax. No pleural effusion. Lungs appear clear, with no acute consolidative airspace disease and no pulmonary edema. IMPRESSION: No active disease. Electronically Signed   By: Delbert Phenix M.D.   On: 10/22/2019 15:55   DG Foot Complete Right  Result Date: 10/22/2019 CLINICAL DATA:  Right foot pain and fever. Diabetes. Clinical infection. EXAM: RIGHT FOOT COMPLETE - 3+ VIEW COMPARISON:  06/19/2016. FINDINGS: Diffuse soft tissue swelling. Again demonstrated surgical absence of the right great toe with interval surgical absence of the right little toe. Well-defined bone margins. No periosteal reaction or bone destruction involving the bones of the foot. There is flattening of the normal plantar arch and interval partial bone collapse and fragmentation involving the distal tibia and talus. There is also chronic appearing periosteal thickening and irregularity involving the distal tibia posteriorly. IMPRESSION: 1. Diffuse soft tissue swelling without soft tissue gas or evidence of underlying osteomyelitis.  2. Pes planus. 3. 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 Electronically Signed   By: Beckie Salts M.D.   On: 10/22/2019 15:59   ECHOCARDIOGRAM COMPLETE  Result Date: 10/23/2019   ECHOCARDIOGRAM REPORT   Patient Name:   JOZIAH DOLLINS Date of Exam: 10/23/2019 Medical Rec #:  [id number]     Height:       76.0 in Accession #:    [id number]    Weight:       206.8 lb Date of Birth:  1966-10-21    BSA:          2.25 m Patient Age:    53 years      BP:           99/54 mmHg Patient Gender: M             HR:           120 bpm. [facility] Procedure: 2D Echo, Cardiac Doppler and Color Doppler Indications:    Atrial Fibrillation 427.31 / I48.91  History:        Patient has no prior history of Echocardiogram examinations.                 Arrythmias:Atrial Fibrillation; Risk Factors:Diabetes and                 Hypertension.  Sonographer:    Celesta Gentile RCS Referring Phys: [phone] DAVID TAT IMPRESSIONS  1. Left ventricular ejection fraction, by visual estimation, is 70 to 75%. The left ventricle has hyperdynamic function. There is mildly increased left ventricular hypertrophy.  2. Left ventricular diastolic parameters are indeterminate.  3. The left ventricle has no regional wall motion abnormalities.  4. Global right ventricle has normal systolic function.The right ventricular size is normal. No increase in right ventricular wall thickness.  5. Left atrial size was normal.  6. Right atrial size was normal.  7. The mitral valve is grossly normal. Trivial mitral valve regurgitation.  8. The tricuspid valve is grossly normal. Tricuspid valve regurgitation is trivial.  9. The aortic valve is tricuspid. Aortic valve regurgitation is not visualized. 10. The pulmonic valve was grossly normal. Pulmonic valve regurgitation is not visualized. 11. Mildly elevated pulmonary  artery systolic pressure. 12. The tricuspid regurgitant velocity is 3.04 m/s, and with an assumed right  atrial pressure of 3 mmHg, the estimated right ventricular systolic pressure is mildly elevated at 40.0 mmHg. 13. The inferior vena cava is normal in size with greater than 50% respiratory variability, suggesting right atrial pressure of 3 mmHg. FINDINGS  Left Ventricle: Left ventricular ejection fraction, by visual estimation, is 70 to 75%. The left ventricle has hyperdynamic function. The left ventricle has no regional wall motion abnormalities. The left ventricular internal cavity size was the left ventricle is normal in size. There is mildly increased left ventricular hypertrophy. Septal left ventricular hypertrophy. Left ventricular diastolic parameters are indeterminate. Right Ventricle: The right ventricular size is normal. No increase in right ventricular wall thickness. Global RV systolic function is has normal systolic function. The tricuspid regurgitant velocity is 3.04 m/s, and with an assumed right atrial pressure  of 3 mmHg, the estimated right ventricular systolic pressure is mildly elevated at 40.0 mmHg. Left Atrium: Left atrial size was normal in size. Right Atrium: Right atrial size was normal in size Pericardium: There is no evidence of pericardial effusion. Mitral Valve: The mitral valve is grossly normal. Trivial mitral valve regurgitation. Tricuspid Valve: The tricuspid valve is grossly normal. Tricuspid valve regurgitation is trivial. Aortic Valve: The aortic valve is tricuspid. Aortic valve regurgitation is not visualized. Pulmonic Valve: The pulmonic valve was grossly normal. Pulmonic valve regurgitation is not visualized. Pulmonic regurgitation is not visualized. Aorta: The aortic root is normal in size and structure. Venous: The inferior vena cava is normal in size with greater than 50% respiratory variability, suggesting right atrial pressure of 3 mmHg. IAS/Shunts: No atrial level shunt detected by color flow Doppler.  LEFT VENTRICLE PLAX 2D LVIDd:         4.44 cm LVIDs:         2.56 cm  LV PW:         1.00 cm LV IVS:        1.26 cm LVOT diam:     2.00 cm LV SV:         66 ml LV SV Index:   29.33 LVOT Area:     3.14 cm  RIGHT VENTRICLE TAPSE (M-mode): 1.7 cm LEFT ATRIUM             Index       RIGHT ATRIUM           Index LA diam:        3.30 cm 1.47 cm/m  RA Area:     20.60 cm LA Vol (A2C):   52.0 ml 23.14 ml/m RA Volume:   57.20 ml  25.46 ml/m LA Vol (A4C):   40.9 ml 18.20 ml/m LA Biplane Vol: 47.2 ml 21.01 ml/m  AORTIC VALVE LVOT Vmax:   129.00 cm/s LVOT Vmean:  82.933 cm/s LVOT VTI:    0.185 m  AORTA Ao Root diam: 3.30 cm MITRAL VALVE                        TRICUSPID VALVE MV Area (PHT): 3.21 cm             TR Peak grad:   37.0 mmHg MV PHT:        68.44 msec           TR Vmax:        304.00 cm/s MV Decel Time: 236 msec MV E velocity: 107.00 cm/s 103  cm/s SHUNTS                                     Systemic VTI:  0.19 m                                     Systemic Diam: 2.00 cm  Nona Dell MD Electronically signed by Nona Dell MD Signature Date/Time: 10/23/2019/9:39:01 AM    Final     Medications Scheduled Meds: . [MAR Hold] Chlorhexidine Gluconate Cloth  6 each Topical Q0600  . [MAR Hold] insulin aspart  0-15 Units Subcutaneous TID WC  . [MAR Hold] insulin aspart  0-5 Units Subcutaneous QHS  . [MAR Hold] insulin glargine  30 Units Subcutaneous QHS  . [MAR Hold] ondansetron (ZOFRAN) IV  4 mg Intravenous Q6H  . [MAR Hold] sodium chloride flush  3 mL Intravenous Q12H   Continuous Infusions: . amiodarone 30 mg/hr (10/24/19 0800)  . diltiazem (CARDIZEM) infusion 10 mg/hr (10/24/19 0800)  . lactated ringers    . lactated ringers with KCl 20 mEq/L 125 mL/hr at 10/24/19 0800  . [MAR Hold] magnesium sulfate bolus IVPB    . [MAR Hold] vancomycin 1,000 mg (10/24/19 1155)   PRN Meds:.[MAR Hold] acetaminophen **OR** [MAR Hold] acetaminophen, [MAR Hold] fentaNYL (SUBLIMAZE) injection, [MAR Hold] fluticasone, [MAR Hold] promethazine  Assessment: 1.  Sepsis 2.   Osteomyelitis of R 3rd toe 3.  Cellulitis of R foot 4.  Charcot arthropathy, R foot/ankle 5.  Diabetes mellitus with peripheral neuropathy  Plan: Continue vancomycin per IM. Consent sign and on chart. Proceed with amputation of R 3rd toe/partial 3rd ray ampuation as planned. Patient has been NPO and heparin stopped at 6 AM.  Marius Ditch 10/24/2019, 12:26 PM

## 2019-10-24 NOTE — Brief Op Note (Addendum)
BRIEF OPERATIVE NOTE  DATE OF PROCEDURE 10/24/2019  SURGEON Marcheta Grammes, DPM  ASSISTANT SURGEON None  OR STAFF Circulator: Brim, Fransisco Hertz, RN Scrub Person: Karin Lieu, CST RN First Assistant: Cipriano Bunker, RN   PREOPERATIVE DIAGNOSIS 1.  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  Ulceration of 3rd toe, right foot 3.  Diabetes mellitus with peripheral neuropathy  POSTOPERATIVE DIAGNOSIS Same  PROCEDURE Partial 3rd ray amputation, right foot  ANESTHESIA Monitor Anesthesia Care   HEMOSTASIS Pneumatic ankle tourniquet set at 250 mmHg  ESTIMATED BLOOD LOSS ~50 cc  MATERIALS USED Packing gauze  INJECTABLES 0.5% Marcaine plain  PATHOLOGY Specimen 1:  Soft tissue and bone from 3rd toe right foot to microbiology Specimen 2:  3rd toe and 3rd metatarsal head right foot to pathology Specimen 3:  Wafer of bone from 3rd metatarsal head right foot to pathlolgy  COMPLICATIONS None

## 2019-10-24 NOTE — Progress Notes (Signed)
Ice packs applied to groin and bilateral axilla, room temperature cooled, and blankets removed from pt.

## 2019-10-24 NOTE — Progress Notes (Signed)
MD Tat notified of rectal temp of 104.7 post surgery. Tylenol suppository given.

## 2019-10-25 ENCOUNTER — Inpatient Hospital Stay (HOSPITAL_COMMUNITY): Payer: Medicare Other

## 2019-10-25 DIAGNOSIS — A4101 Sepsis due to Methicillin susceptible Staphylococcus aureus: Secondary | ICD-10-CM

## 2019-10-25 LAB — BASIC METABOLIC PANEL
Anion gap: 6 (ref 5–15)
BUN: 20 mg/dL (ref 6–20)
CO2: 28 mmol/L (ref 22–32)
Calcium: 8 mg/dL — ABNORMAL LOW (ref 8.9–10.3)
Chloride: 98 mmol/L (ref 98–111)
Creatinine, Ser: 0.69 mg/dL (ref 0.61–1.24)
GFR calc Af Amer: >60 mL/min (ref >60)
GFR calc non Af Amer: >60 mL/min (ref >60)
Glucose, Bld: 211 mg/dL — ABNORMAL HIGH (ref 70–99)
Potassium: 3.9 mmol/L (ref 3.5–5.1)
Sodium: 132 mmol/L — ABNORMAL LOW (ref 135–145)

## 2019-10-25 LAB — GLUCOSE, CAPILLARY
Glucose-Capillary: 200 mg/dL — ABNORMAL HIGH (ref 70–99)
Glucose-Capillary: 184 mg/dL — ABNORMAL HIGH (ref 70–99)
Glucose-Capillary: 216 mg/dL — ABNORMAL HIGH (ref 70–99)
Glucose-Capillary: 177 mg/dL — ABNORMAL HIGH (ref 70–99)

## 2019-10-25 LAB — BLOOD CULTURE ID PANEL (REFLEXED)

## 2019-10-25 LAB — CBC
HCT: 36.2 % — ABNORMAL LOW (ref 39.0–52.0)
Hemoglobin: 12 g/dL — ABNORMAL LOW (ref 13.0–17.0)
MCH: 30.4 pg (ref 26.0–34.0)
MCHC: 33.1 g/dL (ref 30.0–36.0)
MCV: 91.6 fL (ref 80.0–100.0)
Platelets: 132 10*3/uL — ABNORMAL LOW (ref 150–400)
RBC: 3.95 MIL/uL — ABNORMAL LOW (ref 4.22–5.81)
RDW: 12.5 % (ref 11.5–15.5)
WBC: 9.8 10*3/uL (ref 4.0–10.5)
nRBC: 0 % (ref 0.0–0.2)

## 2019-10-25 LAB — PROCALCITONIN: Procalcitonin: 6.7 ng/mL

## 2019-10-25 LAB — HEPARIN LEVEL (UNFRACTIONATED): Heparin Unfractionated: <0.1 IU/mL — ABNORMAL LOW (ref 0.30–0.70)

## 2019-10-25 LAB — VANCOMYCIN, TROUGH: Vancomycin Tr: 11 ug/mL — ABNORMAL LOW (ref 15–20)

## 2019-10-25 LAB — MAGNESIUM: Magnesium: 1.8 mg/dL (ref 1.7–2.4)

## 2019-10-25 LAB — LACTIC ACID, PLASMA: Lactic Acid, Venous: 1.7 mmol/L (ref 0.5–1.9)

## 2019-10-25 MED ORDER — HEPARIN BOLUS VIA INFUSION
3000.0000 [IU] | Freq: Once | INTRAVENOUS | Status: AC
  Administered 2019-10-25: 3000 [IU] via INTRAVENOUS
  Filled 2019-10-25: qty 3000

## 2019-10-25 MED ORDER — HEPARIN BOLUS VIA INFUSION
5000.0000 [IU] | Freq: Once | INTRAVENOUS | Status: AC
  Administered 2019-10-25: 5000 [IU] via INTRAVENOUS
  Filled 2019-10-25: qty 5000

## 2019-10-25 MED ORDER — VANCOMYCIN HCL 1250 MG/250ML IV SOLN
1250.0000 mg | Freq: Three times a day (TID) | INTRAVENOUS | Status: DC
  Administered 2019-10-25 – 2019-10-26 (×2): 1250 mg via INTRAVENOUS
  Filled 2019-10-25 (×2): qty 250

## 2019-10-25 MED ORDER — HEPARIN (PORCINE) 25000 UT/250ML-% IV SOLN
2300.0000 [IU]/h | INTRAVENOUS | Status: DC
  Administered 2019-10-25: 2300 [IU]/h via INTRAVENOUS
  Administered 2019-10-25: 2000 [IU]/h via INTRAVENOUS
  Filled 2019-10-25 (×2): qty 250

## 2019-10-25 NOTE — Progress Notes (Signed)
PROGRESS NOTE  [name] LZJ:[id number] DOB: 1966/09/30 DOA: 10/22/2019 PCP: Dettinger, Anthony Kaufmann, MD   Brief History: 53 year old male with a history of diabetes mellitus type 2, diabetic polyneuropathy, hypertension, left BKA, and right toe amputations secondary to diabetic foot infections presenting with 1 day history of increasing erythema, edema, and pain on his right foot. The patient stated that his podiatrist, Dr. [name] started him on 10 days of doxycycline on 10/10/2019 for his diabetic foot ulcer on his right fourth toe on 10/10/2019.The patient is a bit vague regarding the history of his most recent foot ulcer on his right fourth toe, but stated that he has noted some intermittent drainage in the past 1 to 2 days prior to admission. He had denied any fevers, chills, headache, neck pain, chest pain, shortness breath, cough, hemoptysis, abdominal pain, dysuria, hematuria. The patient did have episodes of nausea and vomiting for the past 3 days. He is also noted some loose stools without any hematochezia or melena. Because of his nausea, vomiting, and right foot erythema and pain he presented for further evaluation. In the ED, the patient had temperature 100.6 F. He was noted to have atrial fibrillation with RVR with heart rates into the 180s. The patient was hemodynamically stable with oxygen saturation 96-97% on room air. WBC was 12.1 with hemoglobin 16.0 and platelets 176K.Sodium was 125 with serum creatinine 1.42. Lactic acid peaked at 3.8. The patient was fluid resuscitated and started on vancomycin and Zosyn. Blood cultures from the emergency department were also obtained.  Assessment/Plan: Sepsis with organ dysfunction -Secondary to diabetic foot infection and bacteremia -Lactic acid peaked 3.8 -check PCT 10.37>>>7.96>>>6.70 -Continue vancomycin -Discontinue Zosyn -Continue IV fluids -SARS-CoV2--neg -10/24/19 spike fever 3 hours  post-op  Diabetic foot infection/cellulitis right foot/Osteomyelitis toe -Podiatry, Dr. [name] has been consulted -MRI foot--osteomyelitis of R-third toe with diffuse cellulitis and myositis -CRP 40.7 -[id number] -10/24/19--Right third ray amputation -10/24/19--bone culture--S. aureus  Bacteremia--Staph aureus -Source is likely foot infection -12/17 blood culture--S. aureus -12/18 blood cultures--gpc -Continue vancomycin pending culture data -Discontinue Zosyn -planning TEE 12/21 or 12/22  DKA, type 2 -pt presented with serum glucose 389 with anion gap 15 and ketonuria -resolved with aggressive fluid resuscitation -transition to [place] insulin  AKI -Resulting from sepsis -Baseline creatinine 0.7-1.0 -Serum creatinine peaked 1.42 -A.m. BMP  Atrial fibrillation with RVR, type unspecified -CHADS-VASc = 3 (HTN, DM2, PAD) -Continue IV heparin in anticipation for possible surgery on foot -Echo--EF 70-75%, no WMA, trace MR/TR, mild increase PASP -TSH--0.929 -continue amiodarone and diltiazem drips through perioperative period -appreciate cardiology followup  Uncontrolled diabetes mellitus type 2 with hyperglycemia -Anticipate elevated CBGs secondary to sepsis -07/22/2019 hemoglobin A1c 12.4 -Increase Lantus to 30 units hs -NovoLog sliding scale -Holding metformin -10/23/19 A1C--9.8  Hypokalemia/hypomagnesemia -Replete -Add potassium to IV fluids  Hyponatremia -Multifactorial including hyperglycemia, volume depletion, AKI -Continue fluid resuscitation  Intractable nausea and vomiting -Suspect a component of gastritis/diabetic gastroparesis incited by sepsisand DKA -Continue around-the-clock Zofran -slowly advance diet as tolerated  Diarrhea -C. difficile negative -If persistent, check stool pathogen panel  Essential hypertension -Blood pressure currently controlled on diltiazem drip -Holding lisinopril secondary to AKI  Hyperlipidemia -Continue  statinwhen able to tolerate po  R-calf pain -venous duplex r/o dvt       Disposition Plan:Remain in ICU Family Communication:NoFamily at bedside  Consultants:Cardiology, podiatry  Code Status: FULL  DVT Prophylaxis:IVHeparin    Procedures: As Listed in Progress Note Above  Antibiotics: vanco 12/16>>> Cefepime 12/18>>> Zosyn 12/16>>>12/17   The patient is critically ill with multiple organ systems failure and requires high complexity decision making for assessment and support, frequent evaluation and titration of therapies, application of advanced monitoring technologies and extensive interpretation of multiple databases.  Critical care time -35 mins.     Subjective: Pt complains of right leg/calf pain.  Denies knee pain.  Patient denies fevers, chills, headache, chest pain, dyspnea, nausea, vomiting, diarrhea, abdominal pain, dysuria, hematuria, hematochezia, and melena.   Objective: Vitals:   10/25/19 0700 10/25/19 0751 10/25/19 0800 10/25/19 1205  BP: 125/73  131/67   Pulse: 91 92 97 92  Resp: (!) 32 (!) 27 (!) 30 (!) 33  Temp:  98 F (36.7 C)  98.4 F (36.9 C)  TempSrc:  Oral  Oral  SpO2: 97% 98% 97% 97%  Weight:      Height:        Intake/Output Summary (Last 24 hours) at 10/25/2019 1301 Last data filed at 10/25/2019 1300 Gross per 24 hour  Intake 4638.94 ml  Output 1695.3 ml  Net 2943.64 ml   Weight change: 5.2 kg Exam:   General:  Pt is alert, follows commands appropriately, not in acute distress  HEENT: No icterus, No thrush, No neck mass, [place]/AT  Cardiovascular: RRR, S1/S2, no rubs, no gallops  Respiratory: CTA bilaterally, no wheezing, no crackles, no rhonchi  Abdomen: Soft/+BS, non tender, non distended, no guarding  Extremities: No edema, No lymphangitis, No petechiae, No rashes, no synovitis   Data Reviewed: I have personally reviewed following labs and imaging studies Basic Metabolic  Panel: Recent Labs  Lab 10/22/19 1520 10/23/19 0359 10/24/19 0357 10/25/19 0418  NA 125* 132* 131* 132*  K 3.4* 3.3* 3.8 3.9  CL 89* 98 96* 98  CO2 21* 22 28 28   GLUCOSE 389* 266* 242* 211*  BUN 40* 31* 25* 20  CREATININE 1.42* 0.97 0.87 0.69  CALCIUM 8.8* 8.5* 8.2* 8.0*  MG 1.5* 1.7 1.8 1.8   Liver Function Tests: Recent Labs  Lab 10/22/19 1520 10/23/19 0359 10/24/19 0357  AST 28 25 22   ALT 30 28 27   ALKPHOS 123 83 69  BILITOT 2.0* 1.6* 1.3*  PROT 7.5 6.0* 5.4*  ALBUMIN 3.4* 2.6* 2.1*   No results for input(s): LIPASE, AMYLASE in the last 168 hours. No results for input(s): AMMONIA in the last 168 hours. Coagulation Profile: Recent Labs  Lab 10/22/19 1520  INR 1.3*   CBC: Recent Labs  Lab 10/22/19 1520 10/23/19 0359 10/24/19 0357 10/25/19 0418  WBC 12.1* 7.7 8.0 9.8  NEUTROABS 10.2* 6.3  --   --   HGB 16.0 13.8 12.4* 12.0*  HCT 46.1 39.0 36.0* 36.2*  MCV 88.0 87.8 87.8 91.6  PLT 176 123* 126* 132*   Cardiac Enzymes: No results for input(s): CKTOTAL, CKMB, CKMBINDEX, TROPONINI in the last 168 hours. BNP: Invalid input(s): POCBNP CBG: Recent Labs  Lab 10/24/19 1221 10/24/19 1647 10/24/19 2116 10/25/19 0749 10/25/19 1202  GLUCAP 224* 232* 225* 200* 184*   HbA1C: Recent Labs    10/23/19 0359  HGBA1C 9.8*   Urine analysis:    Component Value Date/Time   COLORURINE YELLOW 10/22/2019 1508   APPEARANCEUR HAZY (A) 10/22/2019 1508   LABSPEC 1.017 10/22/2019 1508   PHURINE 5.0 10/22/2019 1508   GLUCOSEU >=500 (A) 10/22/2019 1508   HGBUR SMALL (A) 10/22/2019 1508   BILIRUBINUR NEGATIVE 10/22/2019 1508   BILIRUBINUR negative 03/26/2014 1212   KETONESUR 5 (A)  10/22/2019 1508   PROTEINUR NEGATIVE 10/22/2019 1508   UROBILINOGEN 0.2 06/13/2014 1048   NITRITE NEGATIVE 10/22/2019 1508   LEUKOCYTESUR NEGATIVE 10/22/2019 1508   Sepsis Labs: '@LABRCNTIP'$ (procalcitonin:4,lacticidven:4) ) Recent Results (from the past 240 hour(s))  Urine culture      Status: Abnormal   Collection Time: 10/22/19  3:08 PM   Specimen: In/Out Cath Urine  Result Value Ref Range Status   Specimen Description   Final    IN/OUT CATH URINE Performed at [facility], [address] [place], [place] [address]    Special Requests   Final    NONE Performed at [facility], [address] [place], [place] [address]    Culture (A)  Final    <10,000 COLONIES/mL INSIGNIFICANT GROWTH Performed at [facility], [place] [address], [place], [place] [address]    Report Status 10/23/2019 FINAL  Final  Blood Culture (routine x 2)     Status: Abnormal (Preliminary result)   Collection Time: 10/22/19  3:20 PM   Specimen: BLOOD LEFT WRIST  Result Value Ref Range Status   Specimen Description   Final    BLOOD LEFT WRIST Performed at [facility], [address] [place], [place] [address]    Special Requests   Final    BOTTLES DRAWN AEROBIC AND ANAEROBIC Blood Culture adequate volume Performed at [facility], [address] [place], [place] [address]    Culture  Setup Time   Final    GRAM POSITIVE COCCI BOTH ANAEROBIC AND AEROBIC BOTTLES Gram Stain Report Called to,Read Back By and Verified With: HEARN,J'@0705'$  BY MATTHEWS, B 12.17.2020 [facility] Performed at [facility], [address] [place], [place] [address]    Culture (A)  Final    STAPHYLOCOCCUS AUREUS SUSCEPTIBILITIES TO FOLLOW Performed at [facility], [place] [address] [place], [place] [address]    Report Status PENDING  Incomplete  Blood Culture (routine x 2)     Status: Abnormal (Preliminary result)   Collection Time: 10/22/19  3:31 PM   Specimen: Right Antecubital; Blood  Result Value Ref Range Status   Specimen Description   Final    RIGHT ANTECUBITAL Performed at [facility], [address], [place], [place] [address]    Special Requests   Final    BOTTLES DRAWN AEROBIC AND ANAEROBIC Blood Culture adequate volume Performed at [facility], [address] [place], [place]  [address]    Culture  Setup Time   Final    GRAM POSITIVE COCCI ANAEROBIC AND AEROBIC BOTTLES Gram Stain Report Called to,Read Back By and Verified With: HEARN,J'@0705'$  BY MATTHEWS B 12.17.2020 [facility] Performed at [facility], [address] [place],  [address]    Culture STAPHYLOCOCCUS AUREUS (A)  Final   Report Status PENDING  Incomplete  SARS CORONAVIRUS 2 (Keyen Marban 6-24 HRS) Nasopharyngeal Nasopharyngeal Swab     Status: None   Collection Time: 10/22/19  4:14 PM   Specimen: Nasopharyngeal Swab  Result Value Ref Range Status   SARS Coronavirus 2 NEGATIVE NEGATIVE Final    Comment: (NOTE) SARS-CoV-2 target nucleic acids are NOT DETECTED. The SARS-CoV-2 RNA is generally detectable in upper and lower respiratory specimens during the acute phase of infection. Negative results do not preclude SARS-CoV-2 infection, do not rule out co-infections with other pathogens, and should not be used as the sole basis for treatment or other patient management decisions. Negative results must be combined with clinical observations, patient history, and epidemiological information. The expected  result is Negative. Fact Sheet for Patients: SugarRoll.be Fact Sheet for Healthcare Providers: https://www.woods-mathews.com/ This test is not yet approved or cleared by the [place] FDA and  has been authorized for detection and/or diagnosis of SARS-CoV-2 by FDA under an Emergency Use Authorization (EUA). This EUA will remain  in effect (meaning this test can be used) for the duration of the COVID-19 declaration under Section 56 4(b)(1) of the Act, 21 U.S.C. section 360bbb-3(b)(1), unless the authorization is terminated or revoked sooner. Performed at [facility], [place] [address], [place], [place] [address]   MRSA PCR Screening     Status: None   Collection Time: 10/22/19  8:11 PM   Specimen: Nasal Mucosa; Nasopharyngeal  Result Value Ref  Range Status   MRSA by PCR NEGATIVE NEGATIVE Final    Comment:        The GeneXpert MRSA Assay (FDA approved for NASAL specimens only), is one component of a comprehensive MRSA colonization surveillance program. It is not intended to diagnose MRSA infection nor to guide or monitor treatment for MRSA infections. Performed at [facility], [address], [place], [place] [address]   C difficile quick scan w PCR reflex     Status: None   Collection Time: 10/22/19 11:56 PM   Specimen: STOOL  Result Value Ref Range Status   C Diff antigen NEGATIVE NEGATIVE Final   C Diff toxin NEGATIVE NEGATIVE Final   C Diff interpretation No C. difficile detected.  Final    Comment: Performed at [facility], [address] [place], [place] [address]  Culture, blood (routine x 2)     Status: None (Preliminary result)   Collection Time: 10/23/19  8:44 AM   Specimen: BLOOD RIGHT HAND  Result Value Ref Range Status   Specimen Description BLOOD RIGHT HAND  Final   Special Requests   Final    BOTTLES DRAWN AEROBIC AND ANAEROBIC Blood Culture adequate volume   Culture   Final    NO GROWTH 2 DAYS Performed at [facility], [address], [place], [place] [address]    Report Status PENDING  Incomplete  Culture, blood (routine x 2)     Status: None (Preliminary result)   Collection Time: 10/23/19  8:46 AM   Specimen: BLOOD RIGHT HAND  Result Value Ref Range Status   Specimen Description BLOOD RIGHT HAND  Final   Special Requests   Final    BOTTLES DRAWN AEROBIC AND ANAEROBIC Blood Culture results may not be optimal due to an excessive volume of blood received in culture bottles   Culture   Final    NO GROWTH 2 DAYS Performed at [facility], [address] [place], [place] [address]    Report Status PENDING  Incomplete  Aerobic/Anaerobic Culture (surgical/deep wound)     Status: None (Preliminary result)   Collection Time: 10/24/19  2:20 PM   Specimen: Soft Tissue, Other  Result Value Ref Range  Status   Specimen Description   Final    TOE SOFT TISSUE AND BONE OF 3RD Performed at [facility], [place] [address] [place], [place] [address]    Special Requests   Final    NONE Performed at [facility], [address] [place], [place] [address]    Gram Stain   Final    NO WBC SEEN FEW GRAM POSITIVE COCCI Performed at [facility], [place] [address], [place], [place] [address]    Culture MODERATE STAPHYLOCOCCUS AUREUS  Final  Report Status PENDING  Incomplete  Culture, blood (routine x 2)     Status: None (Preliminary result)   Collection Time: 10/24/19  6:33 PM   Specimen: BLOOD RIGHT ARM  Result Value Ref Range Status   Specimen Description BLOOD RIGHT ARM  Final   Special Requests   Final    BOTTLES DRAWN AEROBIC AND ANAEROBIC Blood Culture adequate volume   Culture  Setup Time   Final    GRAM POSITIVE COCCI ANAEROBIC BOTTLE ONLY Gram Stain Report Called to,Read Back By and Verified With: PQDIYM [id number] ON [id number] BY HENDERSON L.    Culture   Final    NO GROWTH < 12 HOURS Performed at [facility], [address] [place], [place] [address]    Report Status PENDING  Incomplete  Culture, blood (routine x 2)     Status: None (Preliminary result)   Collection Time: 10/24/19  6:38 PM   Specimen: BLOOD RIGHT HAND  Result Value Ref Range Status   Specimen Description BLOOD RIGHT HAND  Final   Special Requests   Final    BOTTLES DRAWN AEROBIC AND ANAEROBIC Blood Culture adequate volume   Culture  Setup Time   Final    GRAM POSITIVE COCCI ANAEROBIC BOTTLE ONLY Gram Stain Report Called to,Read Back By and Verified With: HYLTON @ 0315 ON [id number] BY HENDERSON L.    Culture   Final    NO GROWTH < 12 HOURS Performed at [facility], [address], [place], [place] [address]    Report Status PENDING  Incomplete     Scheduled Meds: . Chlorhexidine Gluconate Cloth  6 each Topical Q0600  . insulin aspart  0-15 Units Subcutaneous TID WC  . insulin aspart  0-5 Units  Subcutaneous QHS  . insulin glargine  30 Units Subcutaneous QHS  . ondansetron (ZOFRAN) IV  4 mg Intravenous Q6H  . sodium chloride flush  3 mL Intravenous Q12H   Continuous Infusions: . amiodarone 30 mg/hr (10/25/19 1300)  . ceFEPime (MAXIPIME) IV Stopped (10/25/19 1042)  . diltiazem (CARDIZEM) infusion 5 mg/hr (10/25/19 1300)  . lactated ringers with KCl 20 mEq/L Stopped (10/25/19 1200)  . vancomycin      Procedures/Studies: DG Tibia/Fibula Right  Result Date: 10/22/2019 CLINICAL DATA:  Right leg pain and tenderness. Fever. Clinical evidence of infection. Diabetes. EXAM: RIGHT TIBIA AND FIBULA - 2 VIEW COMPARISON:  Right ankle radiographs obtained at the same time. FINDINGS: Mild diffuse soft tissue swelling. Previously described chronic appearing periosteal reaction involving the distal tibia. On these radiographs, this is involving the medial and posterior aspects of the tibia. Also again noted are previously described bone collapse and fragmentation involving the talotibial joint and flattening of the normal plantar arch. No soft tissue gas is seen. Mild medial and lateral spur formation at the knee joint with mild to moderate medial joint space narrowing. IMPRESSION: 1. Chronic appearing periosteal reaction involving the posterior and medial aspects of the distal tibia. Underlying chronic osteomyelitis is a possibility. 2. Diffuse soft tissue swelling without gas or bone destruction. 3. 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 Electronically Signed   By: Claudie Revering M.D.   On: 10/22/2019 16:05   DG Ankle Complete Right  Result Date: 10/22/2019 CLINICAL DATA:  Right leg pain and tenderness and fever with clinical evidence of infection. Diabetes. EXAM: RIGHT ANKLE - COMPLETE 3+ VIEW COMPARISON:  Right foot radiographs obtained today and on 06/19/2016. FINDINGS: Diffuse soft tissue swelling. Bone collapse and  fragmentation at the talotibial joint, described on the foot  radiographs, in addition to previously described flattening of the normal plantar arch. Again demonstrated are is chronic appearing posterior periosteal reaction involving the distal tibia. No bone destruction or soft tissue gas seen. IMPRESSION: 1. Diffuse soft tissue swelling without underlying changes of acute osteomyelitis. 2. Neuropathic changes at the talotibial joint. 3. Pes planus. Electronically Signed   By: Claudie Revering M.D.   On: 10/22/2019 16:02   MR FOOT RIGHT W WO CONTRAST  Result Date: 10/23/2019 CLINICAL DATA:  Nonhealing ulcer involving the third toe. History of prior amputations. EXAM: MRI OF THE RIGHT FOREFOOT WITHOUT AND WITH CONTRAST TECHNIQUE: Multiplanar, multisequence MR imaging of the right foot was performed before and after the administration of intravenous contrast. CONTRAST:  39m GADAVIST GADOBUTROL 1 MMOL/ML IV SOLN COMPARISON:  Radiographs 10/22/2019 FINDINGS: Abnormal T1 and T2 signal intensity in the proximal phalanx of the third toe and also in what appears to be a small residual portion of the middle phalanx. There is also associated abnormal contrast enhancement and surrounding cellulitis. Findings consistent with osteomyelitis. No other definite sites of acute osteomyelitis are identified. Evidence of prior amputations involving the first and fifth toes and parts of the other toes. The metatarsal bones are intact. No findings for septic arthritis involving the remaining MTP joints or involving the midfoot joint spaces. There is moderate edema like signal abnormality in the navicular bone along with some enhancement. Could not exclude osteomyelitis. Severe diffuse cellulitis involving the entire midfoot and forefoot. Mild diffuse myositis without findings for pyomyositis. IMPRESSION: 1. Osteomyelitis involving the third digit as detailed above. 2. Abnormal signal intensity and enhancement in the navicular bone. This is not completely covered but could not exclude  osteomyelitis. 3. Severe diffuse cellulitis and mild diffuse myositis. Electronically Signed   By: PMarijo SanesM.D.   On: 10/23/2019 16:50   DG Chest Port 1 View  Result Date: 10/22/2019 CLINICAL DATA:  Fever EXAM: PORTABLE CHEST 1 VIEW COMPARISON:  12/09/2013 chest radiograph. FINDINGS: Stable cardiomediastinal silhouette with top-normal heart size. No pneumothorax. No pleural effusion. Lungs appear clear, with no acute consolidative airspace disease and no pulmonary edema. IMPRESSION: No active disease. Electronically Signed   By: JIlona SorrelM.D.   On: 10/22/2019 15:55   DG Foot Complete Right  Result Date: 10/24/2019 CLINICAL DATA:  Patient status post amputation of the right third metatarsal head and toe today. EXAM: RIGHT FOOT COMPLETE - 3+ VIEW COMPARISON:  Plain films the right foot 10/22/2019. FINDINGS: The right third toe and head and neck of the third metatarsal have been amputated. Surgical wound with packing noted. The patient is status post prior amputation of the great toe and distal phalanx of the second toe. 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 No acute abnormality. Electronically Signed   By: TInge RiseM.D.   On: 10/24/2019 15:33   DG Foot Complete Right  Result Date: 10/22/2019 CLINICAL DATA:  Right foot pain and fever. Diabetes. Clinical infection. EXAM: RIGHT FOOT COMPLETE - 3+ VIEW COMPARISON:  06/19/2016. FINDINGS: Diffuse soft tissue swelling. Again demonstrated surgical absence of the right great toe with interval surgical absence of the right little toe. Well-defined bone margins. No periosteal reaction or bone destruction involving the bones of the foot. There is flattening of the normal plantar arch and interval partial bone collapse and fragmentation involving the distal tibia and talus. There is also chronic  appearing periosteal thickening and irregularity involving the distal tibia posteriorly.  IMPRESSION: 1. Diffuse soft tissue swelling without soft tissue gas or evidence of underlying osteomyelitis. 2. Pes planus. 3. 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 Electronically Signed   By: Claudie Revering M.D.   On: 10/22/2019 15:59   ECHOCARDIOGRAM COMPLETE  Result Date: 10/23/2019   ECHOCARDIOGRAM REPORT   Patient Name:   [name] Date of Exam: 10/23/2019 Medical Rec #:  [id number]     Height:       76.0 in Accession #:    [id number]    Weight:       206.8 lb Date of Birth:  08/14/1966    BSA:          2.25 m Patient Age:    36 years      BP:           99/54 mmHg Patient Gender: M             HR:           120 bpm. Exam Location:  [facility] Procedure: 2D Echo, Cardiac Doppler and Color Doppler Indications:    Atrial Fibrillation 427.31 / I48.91  History:        Patient has no prior history of Echocardiogram examinations.                 Arrythmias:Atrial Fibrillation; Risk Factors:Diabetes and                 Hypertension.  Sonographer:    Alvino Chapel RCS Referring Phys: [phone] Cliford Sequeira IMPRESSIONS  1. Left ventricular ejection fraction, by visual estimation, is 70 to 75%. The left ventricle has hyperdynamic function. There is mildly increased left ventricular hypertrophy.  2. Left ventricular diastolic parameters are indeterminate.  3. The left ventricle has no regional wall motion abnormalities.  4. Global right ventricle has normal systolic function.The right ventricular size is normal. No increase in right ventricular wall thickness.  5. Left atrial size was normal.  6. Right atrial size was normal.  7. The mitral valve is grossly normal. Trivial mitral valve regurgitation.  8. The tricuspid valve is grossly normal. Tricuspid valve regurgitation is trivial.  9. The aortic valve is tricuspid. Aortic valve regurgitation is not visualized. 10. The pulmonic valve was grossly normal. Pulmonic valve regurgitation is not visualized. 11. Mildly elevated  pulmonary artery systolic pressure. 12. The tricuspid regurgitant velocity is 3.04 m/s, and with an assumed right atrial pressure of 3 mmHg, the estimated right ventricular systolic pressure is mildly elevated at 40.0 mmHg. 13. The inferior vena cava is normal in size with greater than 50% respiratory variability, suggesting right atrial pressure of 3 mmHg. FINDINGS  Left Ventricle: Left ventricular ejection fraction, by visual estimation, is 70 to 75%. The left ventricle has hyperdynamic function. The left ventricle has no regional wall motion abnormalities. The left ventricular internal cavity size was the left ventricle is normal in size. There is mildly increased left ventricular hypertrophy. Septal left ventricular hypertrophy. Left ventricular diastolic parameters are indeterminate. Right Ventricle: The right ventricular size is normal. No increase in right ventricular wall thickness. Global RV systolic function is has normal systolic function. The tricuspid regurgitant velocity is 3.04 m/s, and with an assumed right atrial pressure  of 3 mmHg, the estimated right ventricular systolic pressure is mildly elevated at 40.0 mmHg. Left Atrium: Left atrial size was normal in size. Right Atrium: Right atrial size  was normal in size Pericardium: There is no evidence of pericardial effusion. Mitral Valve: The mitral valve is grossly normal. Trivial mitral valve regurgitation. Tricuspid Valve: The tricuspid valve is grossly normal. Tricuspid valve regurgitation is trivial. Aortic Valve: The aortic valve is tricuspid. Aortic valve regurgitation is not visualized. Pulmonic Valve: The pulmonic valve was grossly normal. Pulmonic valve regurgitation is not visualized. Pulmonic regurgitation is not visualized. Aorta: The aortic root is normal in size and structure. Venous: The inferior vena cava is normal in size with greater than 50% respiratory variability, suggesting right atrial pressure of 3 mmHg. IAS/Shunts: No atrial  level shunt detected by color flow Doppler.  LEFT VENTRICLE PLAX 2D LVIDd:         4.44 cm LVIDs:         2.56 cm LV PW:         1.00 cm LV IVS:        1.26 cm LVOT diam:     2.00 cm LV SV:         66 ml LV SV Index:   29.33 LVOT Area:     3.14 cm  RIGHT VENTRICLE TAPSE (M-mode): 1.7 cm LEFT ATRIUM             Index       RIGHT ATRIUM           Index LA diam:        3.30 cm 1.47 cm/m  RA Area:     20.60 cm LA Vol (A2C):   52.0 ml 23.14 ml/m RA Volume:   57.20 ml  25.46 ml/m LA Vol (A4C):   40.9 ml 18.20 ml/m LA Biplane Vol: 47.2 ml 21.01 ml/m  AORTIC VALVE LVOT Vmax:   129.00 cm/s LVOT Vmean:  82.933 cm/s LVOT VTI:    0.185 m  AORTA Ao Root diam: 3.30 cm MITRAL VALVE                        TRICUSPID VALVE MV Area (PHT): 3.21 cm             TR Peak grad:   37.0 mmHg MV PHT:        68.44 msec           TR Vmax:        304.00 cm/s MV Decel Time: 236 msec MV E velocity: 107.00 cm/s 103 cm/s SHUNTS                                     Systemic VTI:  0.19 m                                     Systemic Diam: 2.00 cm  Rozann Lesches MD Electronically signed by Rozann Lesches MD Signature Date/Time: 10/23/2019/9:39:01 AM    Final     Orson Eva, DO  [facility] Pager [phone]  If 7PM-7AM, please contact night-coverage [email or web address] Password [email or web address] 10/25/2019, 1:01 PM   LOS: 3 days

## 2019-10-25 NOTE — Progress Notes (Signed)
In person notified Dr. Carles Collet of gram positive cocci in blood cultures

## 2019-10-25 NOTE — Progress Notes (Signed)
Podiatry Progress Note  Subjective POD 1 S/P partial 3rd ray amputation for treatment of osteomyelitis.  Some discomfort along posterior R leg.  Denies any current foot or ankle pain.  Denies N/V.  Did spike fever yesterday afternoon following surgery.  Responded to acetaminophen.  Has been afebrile since 10/24/2019 at 19:01.  Objective Vital signs in last 24 hours:   Temp:  [97.6 F (36.4 C)-104.7 F (40.4 C)] 98 F (36.7 C) (12/19 0751) Pulse Rate:  [81-113] 97 (12/19 0800) Resp:  [19-41] 30 (12/19 0800) BP: (115-157)/(67-81) 131/67 (12/19 0800) SpO2:  [94 %-99 %] 97 % (12/19 0800) Weight:  [99.3 kg] 99.3 kg (12/19 0410)  DRESSING(S): Intact with some strikethrough of blood. INTEGUMENT: Skin edges at amputation site appear viable.  Packing intact.  There is some improvement in erythema present preoperatively.  No skin changes along the posterior of the right heel. VASCULAR: Pedal pulses are palpable.  There is edema of the right lower extremity.  The edema has improved slightly from yesterday.   MUSCULOSKELETAL: The right calf is soft and nontender.  R ankle is NTTP.  Lab/Test Results  Recent Labs    10/24/19 0357 10/25/19 0418  WBC 8.0 9.8  HGB 12.4* 12.0*  HCT 36.0* 36.2*  PLT 126* 132*  NA 131* 132*  K 3.8 3.9  CL 96* 98  CO2 28 28  BUN 25* 20  CREATININE 0.87 0.69  GLUCOSE 242* 211*  CALCIUM 8.2* 8.0*    Recent Results (from the past 240 hour(s))  Urine culture     Status: Abnormal   Collection Time: 10/22/19  3:08 PM   Specimen: In/Out Cath Urine  Result Value Ref Range Status   Specimen Description   Final    IN/OUT CATH URINE Performed at [facility], [address], [place], [place] [address]    Special Requests   Final    NONE Performed at [facility], [address] [place], [place] [address]    Culture (A)  Final    <10,000 COLONIES/mL INSIGNIFICANT GROWTH Performed at [facility], [address], [place], [place] [address]    Report  Status 10/23/2019 FINAL  Final  Blood Culture (routine x 2)     Status: Abnormal (Preliminary result)   Collection Time: 10/22/19  3:20 PM   Specimen: BLOOD LEFT WRIST  Result Value Ref Range Status   Specimen Description   Final    BLOOD LEFT WRIST Performed at [facility], [address], [place], [place] [address]    Special Requests   Final    BOTTLES DRAWN AEROBIC AND ANAEROBIC Blood Culture adequate volume Performed at [facility], [address], [place], [place] [address]    Culture  Setup Time   Final    GRAM POSITIVE COCCI BOTH ANAEROBIC AND AEROBIC BOTTLES Gram Stain Report Called to,Read Back By and Verified With: HEARN,[email or web address]  BY MATTHEWS, B 12.17.2020 [facility] Performed at [facility], [address] [place], [place] [address]    Culture (A)  Final    STAPHYLOCOCCUS AUREUS SUSCEPTIBILITIES TO FOLLOW Performed at [facility], [address], [place], [place] [address]    Report Status PENDING  Incomplete  Blood Culture (routine x 2)     Status: Abnormal (Preliminary result)   Collection Time: 10/22/19  3:31 PM   Specimen: Right Antecubital; Blood  Result Value Ref Range Status   Specimen Description   Final    RIGHT ANTECUBITAL Performed at [facility],  [address], [place], [place] [address]    Special Requests   Final    BOTTLES DRAWN AEROBIC AND ANAEROBIC Blood Culture adequate volume Performed at [facility], [address], [place], [place] [address]    Culture  Setup Time   Final    GRAM POSITIVE COCCI ANAEROBIC AND AEROBIC BOTTLES Gram Stain Report Called to,Read Back By and Verified With: HEARN,[id number]  BY MATTHEWS B 12.17.2020 [facility] Performed at [facility], [address] [place], [place] [address]    Culture STAPHYLOCOCCUS AUREUS (A)  Final   Report Status PENDING  Incomplete  SARS CORONAVIRUS 2 (TAT 6-24 HRS) Nasopharyngeal Nasopharyngeal Swab     Status: None   Collection Time: 10/22/19  4:14 PM   Specimen:  Nasopharyngeal Swab  Result Value Ref Range Status   SARS Coronavirus 2 NEGATIVE NEGATIVE Final    Comment: (NOTE) SARS-CoV-2 target nucleic acids are NOT DETECTED. The SARS-CoV-2 RNA is generally detectable in upper and lower respiratory specimens during the acute phase of infection. Negative results do not preclude SARS-CoV-2 infection, do not rule out co-infections with other pathogens, and should not be used as the sole basis for treatment or other patient management decisions. Negative results must be combined with clinical observations, patient history, and epidemiological information. The expected result is Negative. Fact Sheet for Patients: SugarRoll.be Fact Sheet for Healthcare Providers: https://www.woods-mathews.com/ This test is not yet approved or cleared by the [place] FDA and  has been authorized for detection and/or diagnosis of SARS-CoV-2 by FDA under an Emergency Use Authorization (EUA). This EUA will remain  in effect (meaning this test can be used) for the duration of the COVID-19 declaration under Section 56 4(b)(1) of the Act, 21 U.S.C. section 360bbb-3(b)(1), unless the authorization is terminated or revoked sooner. Performed at [facility], [place] [address] [place], [place] [address]   MRSA PCR Screening     Status: None   Collection Time: 10/22/19  8:11 PM   Specimen: Nasal Mucosa; Nasopharyngeal  Result Value Ref Range Status   MRSA by PCR NEGATIVE NEGATIVE Final    Comment:        The GeneXpert MRSA Assay (FDA approved for NASAL specimens only), is one component of a comprehensive MRSA colonization surveillance program. It is not intended to diagnose MRSA infection nor to guide or monitor treatment for MRSA infections. Performed at [facility], [address], [place], [place] [address]   C difficile quick scan w PCR reflex     Status: None   Collection Time: 10/22/19 11:56 PM   Specimen:  STOOL  Result Value Ref Range Status   C Diff antigen NEGATIVE NEGATIVE Final   C Diff toxin NEGATIVE NEGATIVE Final   C Diff interpretation No C. difficile detected.  Final    Comment: Performed at [facility], [address], [place], [place] [address]  Culture, blood (routine x 2)     Status: None (Preliminary result)   Collection Time: 10/23/19  8:44 AM   Specimen: BLOOD RIGHT HAND  Result Value Ref Range Status   Specimen Description BLOOD RIGHT HAND  Final   Special Requests   Final    BOTTLES DRAWN AEROBIC AND ANAEROBIC Blood Culture adequate volume   Culture   Final    NO GROWTH 2 DAYS Performed at [facility], [address], [place], [place] [address]    Report Status PENDING  Incomplete  Culture, blood (routine x 2)     Status: None (Preliminary  result)   Collection Time: 10/23/19  8:46 AM   Specimen: BLOOD RIGHT HAND  Result Value Ref Range Status   Specimen Description BLOOD RIGHT HAND  Final   Special Requests   Final    BOTTLES DRAWN AEROBIC AND ANAEROBIC Blood Culture results may not be optimal due to an excessive volume of blood received in culture bottles   Culture   Final    NO GROWTH 2 DAYS Performed at [facility], [address] [place], [place] [address]    Report Status PENDING  Incomplete  Aerobic/Anaerobic Culture (surgical/deep wound)     Status: None (Preliminary result)   Collection Time: 10/24/19  2:20 PM   Specimen: Soft Tissue, Other  Result Value Ref Range Status   Specimen Description   Final    TOE SOFT TISSUE AND BONE OF 3RD Performed at [facility], [address] [place], [place] [address]    Special Requests   Final    NONE Performed at [facility], [address] [place], [place] [address]    Gram Stain   Final    NO WBC SEEN FEW GRAM POSITIVE COCCI Performed at [facility], [address] [place], [place] [address]    Culture PENDING  Incomplete   Report Status PENDING  Incomplete  Culture, blood (routine x 2)      Status: None (Preliminary result)   Collection Time: 10/24/19  6:33 PM   Specimen: BLOOD RIGHT ARM  Result Value Ref Range Status   Specimen Description BLOOD RIGHT ARM  Final   Special Requests   Final    BOTTLES DRAWN AEROBIC AND ANAEROBIC Blood Culture adequate volume   Culture   Final    NO GROWTH < 12 HOURS Performed at [facility], [address] [place], [place] [address]    Report Status PENDING  Incomplete  Culture, blood (routine x 2)     Status: None (Preliminary result)   Collection Time: 10/24/19  6:38 PM   Specimen: BLOOD RIGHT HAND  Result Value Ref Range Status   Specimen Description BLOOD RIGHT HAND  Final   Special Requests   Final    BOTTLES DRAWN AEROBIC AND ANAEROBIC Blood Culture adequate volume   Culture   Final    NO GROWTH < 12 HOURS Performed at [facility], [address] [place], [place] [address]    Report Status PENDING  Incomplete     MR FOOT RIGHT W WO CONTRAST  Result Date: 10/23/2019 CLINICAL DATA:  Nonhealing ulcer involving the third toe. History of prior amputations. EXAM: MRI OF THE RIGHT FOREFOOT WITHOUT AND WITH CONTRAST TECHNIQUE: Multiplanar, multisequence MR imaging of the right foot was performed before and after the administration of intravenous contrast. CONTRAST:  10mL GADAVIST GADOBUTROL 1 MMOL/ML IV SOLN COMPARISON:  Radiographs 10/22/2019 FINDINGS: Abnormal T1 and T2 signal intensity in the proximal phalanx of the third toe and also in what appears to be a small residual portion of the middle phalanx. There is also associated abnormal contrast enhancement and surrounding cellulitis. Findings consistent with osteomyelitis. No other definite sites of acute osteomyelitis are identified. Evidence of prior amputations involving the first and fifth toes and parts of the other toes. The metatarsal bones are intact. No findings for septic arthritis involving the remaining MTP joints or involving the midfoot joint spaces. There is moderate  edema like signal abnormality in the navicular bone along with some enhancement. Could not exclude osteomyelitis. Severe diffuse cellulitis involving  the entire midfoot and forefoot. Mild diffuse myositis without findings for pyomyositis. IMPRESSION: 1. Osteomyelitis involving the third digit as detailed above. 2. Abnormal signal intensity and enhancement in the navicular bone. This is not completely covered but could not exclude osteomyelitis. 3. Severe diffuse cellulitis and mild diffuse myositis. Electronically Signed   By: Rudie Meyer M.D.   On: 10/23/2019 16:50   DG Foot Complete Right  Result Date: 10/24/2019 CLINICAL DATA:  Patient status post amputation of the right third metatarsal head and toe today. EXAM: RIGHT FOOT COMPLETE - 3+ VIEW COMPARISON:  Plain films the right foot 10/22/2019. FINDINGS: The right third toe and head and neck of the third metatarsal have been amputated. Surgical wound with packing noted. The patient is status post prior amputation of the great toe and distal phalanx of the second toe. 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 No acute abnormality. Electronically Signed   By: Drusilla Kanner M.D.   On: 10/24/2019 15:33    Medications Scheduled Meds: . Chlorhexidine Gluconate Cloth  6 each Topical Q0600  . insulin aspart  0-15 Units Subcutaneous TID WC  . insulin aspart  0-5 Units Subcutaneous QHS  . insulin glargine  30 Units Subcutaneous QHS  . ondansetron (ZOFRAN) IV  4 mg Intravenous Q6H  . sodium chloride flush  3 mL Intravenous Q12H   Continuous Infusions: . amiodarone 30 mg/hr (10/25/19 0500)  . ceFEPime (MAXIPIME) IV 2 g (10/25/19 1012)  . diltiazem (CARDIZEM) infusion 10 mg/hr (10/25/19 0500)  . lactated ringers with KCl 20 mEq/L 125 mL/hr at 10/25/19 0500  . 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 Bunny boot ordered to offload R heel. Will change packing tomorrow. Continue NWB RLE. Continue antibiotics per IM.  [name] 10/25/2019, 11:32 AM

## 2019-10-25 NOTE — Progress Notes (Signed)
ANTICOAGULATION CONSULT NOTE    Pharmacy Consult for Heparin Indication: atrial fibrillation  No Known Allergies  Patient Measurements: Height: 6\' 4"  (193 cm) Weight: 218 lb 14.7 oz (99.3 kg) IBW/kg (Calculated) : 86.8 Heparin Dosing Weight: 94 kg  Vital Signs: Temp: 98.4 F (36.9 C) (12/19 1205) Temp Source: Oral (12/19 1205) BP: 131/67 (12/19 0800) Pulse Rate: 92 (12/19 1205)  Labs: Recent Labs    10/22/19 1520 10/23/19 0359 10/23/19 1437 10/23/19 2218 10/24/19 0357 10/24/19 0822 10/25/19 0418  HGB 16.0 13.8  --   --  12.4*  --  12.0*  HCT 46.1 39.0  --   --  36.0*  --  36.2*  PLT 176 123*  --   --  126*  --  132*  APTT 30  --   --   --   --   --   --   LABPROT 15.9*  --   --   --   --   --   --   INR 1.3*  --   --   --   --   --   --   HEPARINUNFRC  --   --  <0.10* <0.10*  --  <0.10*  --   CREATININE 1.42* 0.97  --   --  0.87  --  0.69    Estimated Creatinine Clearance: 128.1 mL/min (by C-G formula based on SCr of 0.69 mg/dL).   Medical History: Past Medical History:  Diagnosis Date  . Charcot ankle   . Hypertension   . Osteomyelitis of toe of right foot (HCC)   . Recurrent boils   . Type 2 diabetes mellitus with diabetic neuropathy [facility])    Diagnosed age 28    Medications:  Medications Prior to Admission  Medication Sig Dispense Refill Last Dose  . albuterol (PROVENTIL HFA;VENTOLIN HFA) 108 (90 Base) MCG/ACT inhaler Inhale 2 puffs into the lungs every 6 (six) hours as needed for wheezing or shortness of breath. 1 Inhaler 0 Past Week at Unknown time  . aspirin (ASPIRIN EC) 81 MG EC tablet Take 81 mg by mouth daily. Swallow whole.   Past Week at Unknown time  . atorvastatin (LIPITOR) 20 MG tablet Take 1 tablet (20 mg total) by mouth daily. 90 tablet 3 Past Week at Unknown time  . cyclobenzaprine (FLEXERIL) 10 MG tablet Take 1 tablet (10 mg total) by mouth at bedtime as needed for muscle spasms. 30 tablet 3 Past Week at Unknown time  . doxycycline  (VIBRA-TABS) 100 MG tablet Take 100 mg by mouth 2 (two) times daily. Started course on 10/10/2019   Past Week at Unknown time  . DULoxetine (CYMBALTA) 60 MG capsule Take 1 capsule (60 mg total) by mouth daily. 90 capsule 3 Past Week at Unknown time  . fluticasone (FLONASE) 50 MCG/ACT nasal spray Place 1 spray into both nostrils 2 (two) times daily as needed for allergies or rhinitis. 16 g 6 Past Week at Unknown time  . gabapentin (NEURONTIN) 600 MG tablet Take 1 tablet (600 mg total) by mouth at bedtime. 90 tablet 3 Past Week at Unknown time  . HYDROcodone-acetaminophen (NORCO) 10-325 MG tablet Take 1 tablet by mouth 2 (two) times daily as needed. Do not refill until 60 days from prescription date 60 tablet 0 Past Week at Unknown time  . HYDROcodone-acetaminophen (NORCO) 10-325 MG tablet Take 1 tablet by mouth 2 (two) times daily as needed. Do not refill until 30 days from prescription date 60 tablet 0 Past Week  at Unknown time  . HYDROcodone-acetaminophen (NORCO) 10-325 MG tablet Take 1 tablet by mouth 2 (two) times daily as needed for moderate pain. 60 tablet 0 Past Week at Unknown time  . ibuprofen (ADVIL,MOTRIN) 200 MG tablet Take 200 mg by mouth every 6 (six) hours as needed for mild pain or moderate pain.    Past Week at Unknown time  . Insulin Glargine, 2 Unit Dial, (TOUJEO MAX SOLOSTAR) 300 UNIT/ML SOPN Inject 40-50 Units into the skin daily. 5 pen 5 Past Week at Unknown time  . lisinopril (PRINIVIL,ZESTRIL) 5 MG tablet Take 1 tablet (5 mg total) by mouth daily. 90 tablet 3 Past Week at Unknown time  . metFORMIN (GLUCOPHAGE) 1000 MG tablet Take 1 tablet (1,000 mg total) by mouth 2 (two) times daily. 180 tablet 3 Past Week at Unknown time  . Multiple Vitamins-Minerals (MULTIVITAMIN WITH MINERALS) tablet Take 1 tablet by mouth daily.   Past Week at Unknown time  . terbinafine (LAMISIL AT) 1 % cream Apply 1 application topically 2 (two) times daily. 42 g 0 Past Week at Unknown time  . zinc gluconate  50 MG tablet Take 50 mg by mouth daily.   Past Week at Unknown time  . Continuous Blood Gluc Receiver (FREESTYLE LIBRE 14 DAY READER) DEVI 1 each by Does not apply route daily. (Patient not taking: Reported on 10/22/2019) 1 each 0 Not Taking at Unknown time  . Continuous Blood Gluc Sensor (FREESTYLE LIBRE 14 DAY SENSOR) MISC 1 each by Other route every 14 (fourteen) days. (Patient not taking: Reported on 10/22/2019) 6 each 3 Not Taking at Unknown time    Assessment: Pharmacy consulted to dose heparin in patient with atrial fibrillation.  Patient is not on anticoagulation prior to admission.  Heparin drip to be restarted postop  Goal of Therapy:  Heparin level 0.3-0.7 units/ml Monitor platelets by anticoagulation protocol: Yes    Plan:  Heparin bolus 5000 units x 1 Start heparin infusion at 2000 units/hr (last infusion rate) Check anti-Xa level in 6-8 hours and daily while on heparin. Continue to monitor H&H and platelets.  Margot Ables, PharmD Clinical Pharmacist 10/25/2019 1:55 PM

## 2019-10-25 NOTE — Consult Note (Signed)
[facility] [place] for Infectious Disease    Date of Admission:  10/22/2019           Day 4 vancomycin        Day 2 cefepime       Reason for Consult: Automatic consultation for staph aureus bacteremia    Assessment: He has an acute infection of his right third toe complicated by staph aureus bacteremia. Repeat blood cultures are negative so far.  No evidence of endocarditis was found on TTE.  I recommend continuing current antibiotics pending antibiotic susceptibility results.  Plan: 1. Continue current antibiotics pending antibiotic susceptibility results 2. Await results of repeat blood cultures 3. Consider TEE next week  Principal Problem:   Staphylococcus aureus bacteremia Active Problems:   Osteomyelitis of toe of right foot (HCC)   Severe sepsis (HCC)   Essential hypertension, benign   Charcot ankle   Type 2 diabetes mellitus (HCC)   Diabetic foot infection (HCC)   S/P BKA (below knee amputation) unilateral, left (HCC)   Cellulitis of right lower extremity   Elevated lactic acid level   Leukocytosis   Atrial fibrillation with rapid ventricular response (HCC)   Nausea & vomiting   Diarrhea   AKI (acute kidney injury) ([place])   Hypokalemia   Acute hyponatremia   DKA, type 2 ([place])   Uncontrolled type 2 diabetes mellitus with hyperglycemia, with long-term current use of insulin (HCC)   Sepsis ([place])   Scheduled Meds: . Chlorhexidine Gluconate Cloth  6 each Topical Q0600  . insulin aspart  0-15 Units Subcutaneous TID WC  . insulin aspart  0-5 Units Subcutaneous QHS  . insulin glargine  30 Units Subcutaneous QHS  . ondansetron (ZOFRAN) IV  4 mg Intravenous Q6H  . sodium chloride flush  3 mL Intravenous Q12H   Continuous Infusions: . amiodarone 30 mg/hr (10/25/19 0500)  . ceFEPime (MAXIPIME) IV 2 g (10/25/19 1012)  . diltiazem (CARDIZEM) infusion 10 mg/hr (10/25/19 0500)  . lactated ringers with KCl 20 mEq/L 125 mL/hr at 10/25/19 0500  . vancomycin 1,000 mg  (10/25/19 0629)   PRN Meds:.acetaminophen **OR** acetaminophen, fentaNYL (SUBLIMAZE) injection, fluticasone, promethazine  HPI: [name] is a 53 y.o. male with diabetes complicated by peripheral neuropathy and recurrent ulcers and foot infections.  Previously, he has had undergone left BKA, amputation of his right great toe and partial amputation of his right second and third toes.  He recently developed a new plantar ulcer on his right third toe.  He developed increasing erythema, swelling and purulent drainage leading to readmission on 10/22/2019.  He was febrile and was found to be in atrial fibrillation with a rapid ventricular rate.  Both admission blood cultures have grown staph aureus.  The BCID could not be done because there was a delay and folding the blood culture isolate from the [facility] to the main microbiology lab and his antibiotic susceptibilities are still pending.  Yesterday he underwent amputation of his right third toe.  The operative Gram stain shows gram-positive cocci in urine cultures have already grown staph aureus.  Review of Systems: Review of Systems  Unable to perform ROS: Other  Constitutional:       This is a remote consultation so no review of systems was obtained.    Past Medical History:  Diagnosis Date  . Charcot ankle   . Hypertension   . Osteomyelitis of toe of right foot ([place])   . Recurrent boils   .  Type 2 diabetes mellitus with diabetic neuropathy [facility])    Diagnosed age 48    Social History   Tobacco Use  . Smoking status: Never Smoker  . Smokeless tobacco: Never Used  Substance Use Topics  . Alcohol use: No  . Drug use: No    Family History  Problem Relation Age of Onset  . Stroke Father   . Asthma Father   . COPD Father   . Aneurysm Father        AAA  . Diabetes Brother   . Stroke Brother        2008  . Diabetes Mother    No Known Allergies  OBJECTIVE: Blood pressure 131/67, pulse 97, temperature 98 F (36.7  C), temperature source Oral, resp. rate (!) 30, height  (1.93 m), weight 99.3 kg, SpO2 97 %.  Physical Exam Constitutional:      Comments: This is a remote consultation so no physical exam was performed.     Lab Results Lab Results  Component Value Date   WBC 9.8 10/25/2019   HGB 12.0 (L) 10/25/2019   HCT 36.2 (L) 10/25/2019   MCV 91.6 10/25/2019   PLT 132 (L) 10/25/2019    Lab Results  Component Value Date   CREATININE 0.69 10/25/2019   BUN 20 10/25/2019   NA 132 (L) 10/25/2019   K 3.9 10/25/2019   CL 98 10/25/2019   CO2 28 10/25/2019    Lab Results  Component Value Date   ALT 27 10/24/2019   AST 22 10/24/2019   ALKPHOS 69 10/24/2019   BILITOT 1.3 (H) 10/24/2019     Microbiology: Recent Results (from the past 240 hour(s))  Urine culture     Status: Abnormal   Collection Time: 10/22/19  3:08 PM   Specimen: In/Out Cath Urine  Result Value Ref Range Status   Specimen Description   Final    IN/OUT CATH URINE Performed at [facility], [address] [place], [place] [address]    Special Requests   Final    NONE Performed at [facility], [address], [place], [place] [address]    Culture (A)  Final    <10,000 COLONIES/mL INSIGNIFICANT GROWTH Performed at [facility], [address] [place], [place] [address]    Report Status 10/23/2019 FINAL  Final  Blood Culture (routine x 2)     Status: Abnormal (Preliminary result)   Collection Time: 10/22/19  3:20 PM   Specimen: BLOOD LEFT WRIST  Result Value Ref Range Status   Specimen Description   Final    BLOOD LEFT WRIST Performed at [facility], [address], [place], [place] [address]    Special Requests   Final    BOTTLES DRAWN AEROBIC AND ANAEROBIC Blood Culture adequate volume Performed at [facility], [address] [place], [place] [address]    Culture  Setup Time   Final    GRAM POSITIVE COCCI BOTH ANAEROBIC AND AEROBIC BOTTLES Gram Stain Report Called to,Read Back By and Verified  With: HEARN,[id number]  BY MATTHEWS, B 12.17.2020 [facility] Performed at [facility], [address] [place], [place] [address]    Culture (A)  Final    STAPHYLOCOCCUS AUREUS SUSCEPTIBILITIES TO FOLLOW Performed at [facility], [address] [place], [place] [address]    Report Status PENDING  Incomplete  Blood Culture (routine x 2)     Status: Abnormal (Preliminary result)   Collection Time: 10/22/19  3:31 PM  Specimen: Right Antecubital; Blood  Result Value Ref Range Status   Specimen Description   Final    RIGHT ANTECUBITAL Performed at [facility], [address] [place], [place] [address]    Special Requests   Final    BOTTLES DRAWN AEROBIC AND ANAEROBIC Blood Culture adequate volume Performed at [facility], [address] [place], [place] [address]    Culture  Setup Time   Final    GRAM POSITIVE COCCI ANAEROBIC AND AEROBIC BOTTLES Gram Stain Report Called to,Read Back By and Verified With: HEARN,J@[id number]  BY MATTHEWS B 12.17.2020 [facility] Performed at [facility], [address], [place], [place] [address]    Culture STAPHYLOCOCCUS AUREUS (A)  Final   Report Status PENDING  Incomplete  SARS CORONAVIRUS 2 (TAT 6-24 HRS) Nasopharyngeal Nasopharyngeal Swab     Status: None   Collection Time: 10/22/19  4:14 PM   Specimen: Nasopharyngeal Swab  Result Value Ref Range Status   SARS Coronavirus 2 NEGATIVE NEGATIVE Final    Comment: (NOTE) SARS-CoV-2 target nucleic acids are NOT DETECTED. The SARS-CoV-2 RNA is generally detectable in upper and lower respiratory specimens during the acute phase of infection. Negative results do not preclude SARS-CoV-2 infection, do not rule out co-infections with other pathogens, and should not be used as the sole basis for treatment or other patient management decisions. Negative results must be combined with clinical observations, patient history, and epidemiological information. The expected result is Negative. Fact  Sheet for Patients: HairSlick.no Fact Sheet for Healthcare Providers: quierodirigir.com This test is not yet approved or cleared by the [place] FDA and  has been authorized for detection and/or diagnosis of SARS-CoV-2 by FDA under an Emergency Use Authorization (EUA). This EUA will remain  in effect (meaning this test can be used) for the duration of the COVID-19 declaration under Section 56 4(b)(1) of the Act, 21 U.S.C. section 360bbb-3(b)(1), unless the authorization is terminated or revoked sooner. Performed at [facility], [address] [place], [place] [address]   MRSA PCR Screening     Status: None   Collection Time: 10/22/19  8:11 PM   Specimen: Nasal Mucosa; Nasopharyngeal  Result Value Ref Range Status   MRSA by PCR NEGATIVE NEGATIVE Final    Comment:        The GeneXpert MRSA Assay (FDA approved for NASAL specimens only), is one component of a comprehensive MRSA colonization surveillance program. It is not intended to diagnose MRSA infection nor to guide or monitor treatment for MRSA infections. Performed at [facility], [address], [place], [place] [address]   C difficile quick scan w PCR reflex     Status: None   Collection Time: 10/22/19 11:56 PM   Specimen: STOOL  Result Value Ref Range Status   C Diff antigen NEGATIVE NEGATIVE Final   C Diff toxin NEGATIVE NEGATIVE Final   C Diff interpretation No C. difficile detected.  Final    Comment: Performed at [facility], [address], [place], [place] [address]  Culture, blood (routine x 2)     Status: None (Preliminary result)   Collection Time: 10/23/19  8:44 AM   Specimen: BLOOD RIGHT HAND  Result Value Ref Range Status   Specimen Description BLOOD RIGHT HAND  Final   Special Requests   Final    BOTTLES DRAWN AEROBIC AND ANAEROBIC Blood Culture adequate volume   Culture   Final    NO GROWTH 2 DAYS Performed at [facility], [address] [place], [place] [address]    Report Status PENDING  Incomplete  Culture, blood (routine x 2)     Status: None (Preliminary result)   Collection Time: 10/23/19  8:46 AM   Specimen: BLOOD RIGHT HAND  Result Value Ref Range Status   Specimen Description BLOOD RIGHT HAND  Final   Special Requests   Final    BOTTLES DRAWN AEROBIC AND ANAEROBIC Blood Culture results may not be optimal due to an excessive volume of blood received in culture bottles   Culture   Final    NO GROWTH 2 DAYS Performed at [facility], [address] [place], [place] [address]    Report Status PENDING  Incomplete  Aerobic/Anaerobic Culture (surgical/deep wound)     Status: None (Preliminary result)   Collection Time: 10/24/19  2:20 PM   Specimen: Soft Tissue, Other  Result Value Ref Range Status   Specimen Description   Final    TOE SOFT TISSUE AND BONE OF 3RD Performed at [facility], [address] [place], [place] [address]    Special Requests   Final    NONE Performed at [facility], [address] [place], [place] [address]    Gram Stain   Final    NO WBC SEEN FEW GRAM POSITIVE COCCI Performed at [facility], [address] [place], [place] [address]    Culture PENDING  Incomplete   Report Status PENDING  Incomplete  Culture, blood (routine x 2)     Status: None (Preliminary result)   Collection Time: 10/24/19  6:33 PM   Specimen: BLOOD RIGHT ARM  Result Value Ref Range Status   Specimen Description BLOOD RIGHT ARM  Final   Special Requests   Final    BOTTLES DRAWN AEROBIC AND ANAEROBIC Blood Culture adequate volume   Culture   Final    NO GROWTH < 12 HOURS Performed at [facility], [address] [place], [place] [address]    Report Status PENDING  Incomplete  Culture, blood (routine x 2)     Status: None (Preliminary result)   Collection Time: 10/24/19  6:38 PM   Specimen: BLOOD RIGHT HAND  Result Value Ref Range Status   Specimen Description BLOOD RIGHT HAND  Final     Special Requests   Final    BOTTLES DRAWN AEROBIC AND ANAEROBIC Blood Culture adequate volume   Culture   Final    NO GROWTH < 12 HOURS Performed at [facility], [address] [place], [place] [address]    Report Status PENDING  Incomplete    Anthony AstersJohn Lula Kolton, MD [facility] [phone] pager   [phone] cell 10/25/2019, 12:00 PM

## 2019-10-25 NOTE — Progress Notes (Signed)
ANTICOAGULATION CONSULT NOTE    Pharmacy Consult for Heparin Indication: atrial fibrillation  No Known Allergies  Patient Measurements: Height: 6\' 4"  (193 cm) Weight: 218 lb 14.7 oz (99.3 kg) IBW/kg (Calculated) : 86.8 Heparin Dosing Weight: 94 kg  Vital Signs: Temp: 99.1 F (37.3 C) (12/19 2000) Temp Source: Oral (12/19 2000) BP: 136/67 (12/19 2000) Pulse Rate: 103 (12/19 2000)  Labs: Recent Labs    10/23/19 0359 10/23/19 2218 10/24/19 0357 10/24/19 0822 10/25/19 0418 10/25/19 2036  HGB 13.8  --  12.4*  --  12.0*  --   HCT 39.0  --  36.0*  --  36.2*  --   PLT 123*  --  126*  --  132*  --   HEPARINUNFRC  --  <0.10*  --  <0.10*  --  <0.10*  CREATININE 0.97  --  0.87  --  0.69  --     Estimated Creatinine Clearance: 128.1 mL/min (by C-G formula based on SCr of 0.69 mg/dL).   Medical History: Past Medical History:  Diagnosis Date  . Charcot ankle   . Hypertension   . Osteomyelitis of toe of right foot ([place])   . Recurrent boils   . Type 2 diabetes mellitus with diabetic neuropathy [facility])    Diagnosed age 49    Medications:  Medications Prior to Admission  Medication Sig Dispense Refill Last Dose  . albuterol (PROVENTIL HFA;VENTOLIN HFA) 108 (90 Base) MCG/ACT inhaler Inhale 2 puffs into the lungs every 6 (six) hours as needed for wheezing or shortness of breath. 1 Inhaler 0 Past Week at Unknown time  . aspirin (ASPIRIN EC) 81 MG EC tablet Take 81 mg by mouth daily. Swallow whole.   Past Week at Unknown time  . atorvastatin (LIPITOR) 20 MG tablet Take 1 tablet (20 mg total) by mouth daily. 90 tablet 3 Past Week at Unknown time  . cyclobenzaprine (FLEXERIL) 10 MG tablet Take 1 tablet (10 mg total) by mouth at bedtime as needed for muscle spasms. 30 tablet 3 Past Week at Unknown time  . doxycycline (VIBRA-TABS) 100 MG tablet Take 100 mg by mouth 2 (two) times daily. Started course on 10/10/2019   Past Week at Unknown time  . DULoxetine (CYMBALTA) 60 MG capsule Take 1  capsule (60 mg total) by mouth daily. 90 capsule 3 Past Week at Unknown time  . fluticasone (FLONASE) 50 MCG/ACT nasal spray Place 1 spray into both nostrils 2 (two) times daily as needed for allergies or rhinitis. 16 g 6 Past Week at Unknown time  . gabapentin (NEURONTIN) 600 MG tablet Take 1 tablet (600 mg total) by mouth at bedtime. 90 tablet 3 Past Week at Unknown time  . HYDROcodone-acetaminophen (NORCO) 10-325 MG tablet Take 1 tablet by mouth 2 (two) times daily as needed. Do not refill until 60 days from prescription date 60 tablet 0 Past Week at Unknown time  . HYDROcodone-acetaminophen (NORCO) 10-325 MG tablet Take 1 tablet by mouth 2 (two) times daily as needed. Do not refill until 30 days from prescription date 60 tablet 0 Past Week at Unknown time  . HYDROcodone-acetaminophen (NORCO) 10-325 MG tablet Take 1 tablet by mouth 2 (two) times daily as needed for moderate pain. 60 tablet 0 Past Week at Unknown time  . ibuprofen (ADVIL,MOTRIN) 200 MG tablet Take 200 mg by mouth every 6 (six) hours as needed for mild pain or moderate pain.    Past Week at Unknown time  . Insulin Glargine, 2 Unit Dial, (TOUJEO  MAX SOLOSTAR) 300 UNIT/ML SOPN Inject 40-50 Units into the skin daily. 5 pen 5 Past Week at Unknown time  . lisinopril (PRINIVIL,ZESTRIL) 5 MG tablet Take 1 tablet (5 mg total) by mouth daily. 90 tablet 3 Past Week at Unknown time  . metFORMIN (GLUCOPHAGE) 1000 MG tablet Take 1 tablet (1,000 mg total) by mouth 2 (two) times daily. 180 tablet 3 Past Week at Unknown time  . Multiple Vitamins-Minerals (MULTIVITAMIN WITH MINERALS) tablet Take 1 tablet by mouth daily.   Past Week at Unknown time  . terbinafine (LAMISIL AT) 1 % cream Apply 1 application topically 2 (two) times daily. 42 g 0 Past Week at Unknown time  . zinc gluconate 50 MG tablet Take 50 mg by mouth daily.   Past Week at Unknown time  . Continuous Blood Gluc Receiver (FREESTYLE LIBRE 14 DAY READER) DEVI 1 each by Does not apply route  daily. (Patient not taking: Reported on 10/22/2019) 1 each 0 Not Taking at Unknown time  . Continuous Blood Gluc Sensor (FREESTYLE LIBRE 14 DAY SENSOR) MISC 1 each by Other route every 14 (fourteen) days. (Patient not taking: Reported on 10/22/2019) 6 each 3 Not Taking at Unknown time    Assessment: Pharmacy consulted to dose heparin in patient with atrial fibrillation.  Patient is not on anticoagulation prior to admission.  Heparin drip to be restarted postop  Goal of Therapy:  Heparin level 0.3-0.7 units/ml Monitor platelets by anticoagulation protocol: Yes    Plan:  Heparin bolus 3000 units x 1 Increase heparin infusion to 2300 units/hr  Anti-Xa level in 6-8 hours and daily while on heparin. Continue to monitor H&H and platelets.  Judeth Cornfield, PharmD Clinical Pharmacist 10/25/2019 10:03 PM

## 2019-10-26 DIAGNOSIS — Z86718 Personal history of other venous thrombosis and embolism: Secondary | ICD-10-CM

## 2019-10-26 DIAGNOSIS — A4101 Sepsis due to Methicillin susceptible Staphylococcus aureus: Principal | ICD-10-CM

## 2019-10-26 DIAGNOSIS — I82431 Acute embolism and thrombosis of right popliteal vein: Secondary | ICD-10-CM

## 2019-10-26 LAB — GLUCOSE, CAPILLARY
Glucose-Capillary: 155 mg/dL — ABNORMAL HIGH (ref 70–99)
Glucose-Capillary: 140 mg/dL — ABNORMAL HIGH (ref 70–99)
Glucose-Capillary: 155 mg/dL — ABNORMAL HIGH (ref 70–99)
Glucose-Capillary: 130 mg/dL — ABNORMAL HIGH (ref 70–99)

## 2019-10-26 LAB — BASIC METABOLIC PANEL
Anion gap: 9 (ref 5–15)
BUN: 18 mg/dL (ref 6–20)
CO2: 25 mmol/L (ref 22–32)
Calcium: 7.8 mg/dL — ABNORMAL LOW (ref 8.9–10.3)
Chloride: 96 mmol/L — ABNORMAL LOW (ref 98–111)
Creatinine, Ser: 0.69 mg/dL (ref 0.61–1.24)
GFR calc Af Amer: >60 mL/min (ref >60)
GFR calc non Af Amer: >60 mL/min (ref >60)
Glucose, Bld: 198 mg/dL — ABNORMAL HIGH (ref 70–99)
Potassium: 3.5 mmol/L (ref 3.5–5.1)
Sodium: 130 mmol/L — ABNORMAL LOW (ref 135–145)

## 2019-10-26 LAB — CBC
HCT: 37.5 % — ABNORMAL LOW (ref 39.0–52.0)
Hemoglobin: 12.6 g/dL — ABNORMAL LOW (ref 13.0–17.0)
MCH: 29.9 pg (ref 26.0–34.0)
MCHC: 33.6 g/dL (ref 30.0–36.0)
MCV: 88.9 fL (ref 80.0–100.0)
Platelets: 185 10*3/uL (ref 150–400)
RBC: 4.22 MIL/uL (ref 4.22–5.81)
RDW: 12.3 % (ref 11.5–15.5)
WBC: 11.9 10*3/uL — ABNORMAL HIGH (ref 4.0–10.5)
nRBC: 0 % (ref 0.0–0.2)

## 2019-10-26 LAB — CULTURE, BLOOD (ROUTINE X 2)
Special Requests: ADEQUATE
Special Requests: ADEQUATE

## 2019-10-26 LAB — HEPARIN LEVEL (UNFRACTIONATED): Heparin Unfractionated: <0.1 IU/mL — ABNORMAL LOW (ref 0.30–0.70)

## 2019-10-26 LAB — MAGNESIUM: Magnesium: 1.6 mg/dL — ABNORMAL LOW (ref 1.7–2.4)

## 2019-10-26 MED ORDER — POTASSIUM CHLORIDE IN NACL 20-0.9 MEQ/L-% IV SOLN: INTRAVENOUS | Status: DC

## 2019-10-26 MED ORDER — AMIODARONE HCL 200 MG PO TABS
200.0000 mg | ORAL_TABLET | Freq: Two times a day (BID) | ORAL | Status: DC
  Administered 2019-10-26 – 2019-10-30 (×9): 200 mg via ORAL
  Filled 2019-10-26 (×9): qty 1

## 2019-10-26 MED ORDER — CEFAZOLIN SODIUM-DEXTROSE 2-4 GM/100ML-% IV SOLN
2.0000 g | Freq: Three times a day (TID) | INTRAVENOUS | Status: DC
  Administered 2019-10-26 – 2019-10-27 (×4): 2 g via INTRAVENOUS
  Filled 2019-10-26 (×4): qty 100

## 2019-10-26 MED ORDER — MAGNESIUM SULFATE 2 GM/50ML IV SOLN
2.0000 g | Freq: Once | INTRAVENOUS | Status: AC
  Administered 2019-10-26: 2 g via INTRAVENOUS
  Filled 2019-10-26: qty 50

## 2019-10-26 MED ORDER — APIXABAN 5 MG PO TABS
5.0000 mg | ORAL_TABLET | Freq: Two times a day (BID) | ORAL | Status: DC
  Administered 2019-10-26 – 2019-10-27 (×4): 5 mg via ORAL
  Filled 2019-10-26 (×4): qty 1

## 2019-10-26 MED ORDER — POTASSIUM CHLORIDE CRYS ER 20 MEQ PO TBCR
20.0000 meq | EXTENDED_RELEASE_TABLET | Freq: Once | ORAL | Status: AC
  Administered 2019-10-26: 20 meq via ORAL
  Filled 2019-10-26: qty 1

## 2019-10-26 MED ORDER — DILTIAZEM HCL ER COATED BEADS 120 MG PO CP24
120.0000 mg | ORAL_CAPSULE | Freq: Every day | ORAL | Status: DC
  Administered 2019-10-26 – 2019-10-30 (×5): 120 mg via ORAL
  Filled 2019-10-26 (×5): qty 1

## 2019-10-26 NOTE — Progress Notes (Addendum)
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  Venous doppler performed yesterday revealed DVT of R popliteal vein.  Denies any nausea, vomiting, shortness of breath or chest pain.  Denies any current pain along posterior R leg.  Objective Vital signs in last 24 hours:   Temp:  [97.4 F (36.3 C)-99.1 F (37.3 C)] 98.3 F (36.8 C) (12/20 0732) Pulse Rate:  [90-118] 100 (12/20 0930) Resp:  [18-39] 27 (12/20 0930) BP: (111-154)/(50-83) 148/83 (12/20 0900) SpO2:  [92 %-100 %] 98 % (12/20 0930) Weight:  [99 kg] 99 kg (12/20 0630)  DRESSING(S): Intact without strikethrough.  Heel protective boot not in place. INTEGUMENT: Skin edges at amputation site remain viable.  Wound bed viable with no necrosis present.  Further improvement in erythema of foot and ankle.  No skin changes along the posterior of the right heel.  Persistent streaking of R leg. VASCULAR: Pedal pulses are palpable.  Edema of the RLE.   MUSCULOSKELETAL: R calf is soft and nontender.  R ankle is NTTP.  Lab/Test Results  Recent Labs    10/25/19 0418 10/26/19 0444  WBC 9.8 11.9*  HGB 12.0* 12.6*  HCT 36.2* 37.5*  PLT 132* 185  NA 132* 130*  K 3.9 3.5  CL 98 96*  CO2 28 25  BUN 20 18  CREATININE 0.69 0.69  GLUCOSE 211* 198*  CALCIUM 8.0* 7.8*    Recent Results (from the past 240 hour(s))  Urine culture     Status: Abnormal   Collection Time: 10/22/19  3:08 PM   Specimen: In/Out Cath Urine  Result Value Ref Range Status   Specimen Description   Final    IN/OUT CATH URINE Performed at [facility], [address], [place], [place] [address]    Special Requests   Final    NONE Performed at [facility], [address] [place], [place] [address]    Culture (A)  Final    <10,000 COLONIES/mL INSIGNIFICANT GROWTH Performed at [facility], [place] [address] [place], [place] [address]    Report Status 10/23/2019 FINAL  Final  Blood Culture (routine x 2)     Status: Abnormal    Collection Time: 10/22/19  3:20 PM   Specimen: BLOOD LEFT WRIST  Result Value Ref Range Status   Specimen Description   Final    BLOOD LEFT WRIST Performed at [facility], [address], [place], [place] [address]    Special Requests   Final    BOTTLES DRAWN AEROBIC AND ANAEROBIC Blood Culture adequate volume Performed at [facility], [address] [place], [place] [address]    Culture  Setup Time   Final    GRAM POSITIVE COCCI BOTH ANAEROBIC AND AEROBIC BOTTLES Gram Stain Report Called to,Read Back By and Verified With: HEARN,[email or web address]  BY MATTHEWS, B 12.17.2020 [facility] Performed at [facility], [address], [place], [place] [address]    Culture STAPHYLOCOCCUS AUREUS (A)  Final   Report Status 10/26/2019 FINAL  Final   Organism ID, Bacteria STAPHYLOCOCCUS AUREUS  Final      Susceptibility   Staphylococcus aureus - MIC*    CIPROFLOXACIN >=8 RESISTANT Resistant     ERYTHROMYCIN >=8 RESISTANT Resistant     GENTAMICIN <=0.5 SENSITIVE Sensitive     OXACILLIN 0.5 SENSITIVE Sensitive     TETRACYCLINE >=16 RESISTANT Resistant     VANCOMYCIN 1 SENSITIVE Sensitive     TRIMETH/SULFA <=10 SENSITIVE Sensitive     CLINDAMYCIN  RESISTANT Resistant     RIFAMPIN <=0.5 SENSITIVE Sensitive     Inducible Clindamycin POSITIVE Resistant     * STAPHYLOCOCCUS AUREUS  Blood Culture (routine x 2)     Status: Abnormal   Collection Time: 10/22/19  3:31 PM   Specimen: Right Antecubital; Blood  Result Value Ref Range Status   Specimen Description   Final    RIGHT ANTECUBITAL Performed at [facility], [address] [place], [place] [address]    Special Requests   Final    BOTTLES DRAWN AEROBIC AND ANAEROBIC Blood Culture adequate volume Performed at [facility], [address] [place], [place] [address]    Culture  Setup Time   Final    GRAM POSITIVE COCCI ANAEROBIC AND AEROBIC BOTTLES Gram Stain Report Called to,Read Back By and Verified With: HEARN,[email or web address]  BY MATTHEWS B  12.17.2020 [facility] Performed at [facility], [address] [place], [place] [address]    Culture (A)  Final    STAPHYLOCOCCUS AUREUS SUSCEPTIBILITIES PERFORMED ON PREVIOUS CULTURE WITHIN THE LAST 5 DAYS. Performed at [facility], [address] [place], [place] [address]    Report Status 10/26/2019 FINAL  Final  SARS CORONAVIRUS 2 (TAT 6-24 HRS) Nasopharyngeal Nasopharyngeal Swab     Status: None   Collection Time: 10/22/19  4:14 PM   Specimen: Nasopharyngeal Swab  Result Value Ref Range Status   SARS Coronavirus 2 NEGATIVE NEGATIVE Final    Comment: (NOTE) SARS-CoV-2 target nucleic acids are NOT DETECTED. The SARS-CoV-2 RNA is generally detectable in upper and lower respiratory specimens during the acute phase of infection. Negative results do not preclude SARS-CoV-2 infection, do not rule out co-infections with other pathogens, and should not be used as the sole basis for treatment or other patient management decisions. Negative results must be combined with clinical observations, patient history, and epidemiological information. The expected result is Negative. Fact Sheet for Patients: HairSlick.nohttps://www.fda.gov/media/138098/download Fact Sheet for Healthcare Providers: quierodirigir.comhttps://www.fda.gov/media/138095/download This test is not yet approved or cleared by the [place] FDA and  has been authorized for detection and/or diagnosis of SARS-CoV-2 by FDA under an Emergency Use Authorization (EUA). This EUA will remain  in effect (meaning this test can be used) for the duration of the COVID-19 declaration under Section 56 4(b)(1) of the Act, 21 U.S.C. section 360bbb-3(b)(1), unless the authorization is terminated or revoked sooner. Performed at [facility], [address] [place], [place] [address]   MRSA PCR Screening     Status: None   Collection Time: 10/22/19  8:11 PM   Specimen: Nasal Mucosa; Nasopharyngeal  Result Value Ref Range Status   MRSA by PCR  NEGATIVE NEGATIVE Final    Comment:        The GeneXpert MRSA Assay (FDA approved for NASAL specimens only), is one component of a comprehensive MRSA colonization surveillance program. It is not intended to diagnose MRSA infection nor to guide or monitor treatment for MRSA infections. Performed at [facility], [address] [place], [place] [address]   C difficile quick scan w PCR reflex     Status: None   Collection Time: 10/22/19 11:56 PM   Specimen: STOOL  Result Value Ref Range Status   C Diff antigen NEGATIVE NEGATIVE Final   C Diff toxin NEGATIVE NEGATIVE Final   C Diff interpretation No C. difficile detected.  Final    Comment: Performed at [facility], [address] [place], [place] [address]  Culture, blood (routine x  2)     Status: None (Preliminary result)   Collection Time: 10/23/19  8:44 AM   Specimen: BLOOD RIGHT HAND  Result Value Ref Range Status   Specimen Description BLOOD RIGHT HAND  Final   Special Requests   Final    BOTTLES DRAWN AEROBIC AND ANAEROBIC Blood Culture adequate volume   Culture   Final    NO GROWTH 3 DAYS Performed at [facility], [address], [place], [place] [address]    Report Status PENDING  Incomplete  Culture, blood (routine x 2)     Status: None (Preliminary result)   Collection Time: 10/23/19  8:46 AM   Specimen: BLOOD RIGHT HAND  Result Value Ref Range Status   Specimen Description BLOOD RIGHT HAND  Final   Special Requests   Final    BOTTLES DRAWN AEROBIC AND ANAEROBIC Blood Culture results may not be optimal due to an excessive volume of blood received in culture bottles   Culture   Final    NO GROWTH 3 DAYS Performed at [facility], [address] [place], [place] [address]    Report Status PENDING  Incomplete  Aerobic/Anaerobic Culture (surgical/deep wound)     Status: None (Preliminary result)   Collection Time: 10/24/19  2:20 PM   Specimen: Soft Tissue, Other  Result Value Ref Range Status   Specimen  Description TOE SOFT TISSUE AND BONE OF 3RD  Final   Special Requests NONE  Final   Gram Stain NO WBC SEEN FEW GRAM POSITIVE COCCI   Final   Culture MODERATE STAPHYLOCOCCUS AUREUS  Final   Report Status PENDING  Incomplete   Organism ID, Bacteria STAPHYLOCOCCUS AUREUS  Final      Susceptibility   Staphylococcus aureus - MIC*    CIPROFLOXACIN >=8 RESISTANT Resistant     ERYTHROMYCIN >=8 RESISTANT Resistant     GENTAMICIN <=0.5 SENSITIVE Sensitive     OXACILLIN 0.5 SENSITIVE Sensitive     TETRACYCLINE >=16 RESISTANT Resistant     VANCOMYCIN <=0.5 SENSITIVE Sensitive     TRIMETH/SULFA <=10 SENSITIVE Sensitive     CLINDAMYCIN RESISTANT Resistant     RIFAMPIN <=0.5 SENSITIVE Sensitive     Inducible Clindamycin Value in next row Resistant      POSITIVEPerformed at [facility], [address] [place], [place] [address]    * MODERATE STAPHYLOCOCCUS AUREUS  Culture, blood (routine x 2)     Status: Abnormal (Preliminary result)   Collection Time: 10/24/19  6:33 PM   Specimen: BLOOD RIGHT ARM  Result Value Ref Range Status   Specimen Description   Final    BLOOD RIGHT ARM Performed at [facility], [address], [place], [place] [address]    Special Requests   Final    BOTTLES DRAWN AEROBIC AND ANAEROBIC Blood Culture adequate volume Performed at [facility], [address] [place], [place] [address]    Culture  Setup Time   Final    GRAM POSITIVE COCCI ANAEROBIC BOTTLE ONLY Gram Stain Report Called to,Read Back By and Verified With: HYLTON 1238 ON [id number] BY HENDERSON L. CRITICAL RESULT CALLED TO, READ BACK BY AND VERIFIED WITH: E. MURPHY, RN (APH) AT 1820 ON 10/25/19 BY C. JESSUP, MT.    Culture (A)  Final    STAPHYLOCOCCUS AUREUS SUSCEPTIBILITIES PERFORMED ON PREVIOUS CULTURE WITHIN THE LAST 5 DAYS. Performed at [facility], [address], [place], [place] [address]    Report Status PENDING  Incomplete  Blood Culture  ID Panel (Reflexed)     Status: Abnormal    Collection Time: 10/24/19  6:33 PM  Result Value Ref Range Status   Enterococcus species NOT DETECTED NOT DETECTED Final   Listeria monocytogenes NOT DETECTED NOT DETECTED Final   Staphylococcus species DETECTED (A) NOT DETECTED Final    Comment: CRITICAL RESULT CALLED TO, READ BACK BY AND VERIFIED WITH: E. MURPHY, RN (APH) AT 1820 ON 10/25/19 BY C. JESSUP, MT.    Staphylococcus aureus (BCID) DETECTED (A) NOT DETECTED Final    Comment: Methicillin (oxacillin) susceptible Staphylococcus aureus (MSSA). Preferred therapy is anti staphylococcal beta lactam antibiotic (Cefazolin or Nafcillin), unless clinically contraindicated. CRITICAL RESULT CALLED TO, READ BACK BY AND VERIFIED WITH: E. MURPHY, RN (APH) AT 1820 ON 10/25/19 BY C. JESSUP, MT.    Methicillin resistance NOT DETECTED NOT DETECTED Final   Streptococcus species NOT DETECTED NOT DETECTED Final   Streptococcus agalactiae NOT DETECTED NOT DETECTED Final   Streptococcus pneumoniae NOT DETECTED NOT DETECTED Final   Streptococcus pyogenes NOT DETECTED NOT DETECTED Final   Acinetobacter baumannii NOT DETECTED NOT DETECTED Final   Enterobacteriaceae species NOT DETECTED NOT DETECTED Final   Enterobacter cloacae complex NOT DETECTED NOT DETECTED Final   Escherichia coli NOT DETECTED NOT DETECTED Final   Klebsiella oxytoca NOT DETECTED NOT DETECTED Final   Klebsiella pneumoniae NOT DETECTED NOT DETECTED Final   Proteus species NOT DETECTED NOT DETECTED Final   Serratia marcescens NOT DETECTED NOT DETECTED Final   Haemophilus influenzae NOT DETECTED NOT DETECTED Final   Neisseria meningitidis NOT DETECTED NOT DETECTED Final   Pseudomonas aeruginosa NOT DETECTED NOT DETECTED Final   Candida albicans NOT DETECTED NOT DETECTED Final   Candida glabrata NOT DETECTED NOT DETECTED Final   Candida krusei NOT DETECTED NOT DETECTED Final   Candida parapsilosis NOT DETECTED NOT DETECTED Final   Candida tropicalis NOT DETECTED NOT DETECTED Final     Comment: Performed at [facility], [address] [place], [place] [address]  Culture, blood (routine x 2)     Status: Abnormal (Preliminary result)   Collection Time: 10/24/19  6:38 PM   Specimen: BLOOD RIGHT HAND  Result Value Ref Range Status   Specimen Description   Final    BLOOD RIGHT HAND Performed at [facility], [address], [place], [place] [address]    Special Requests   Final    BOTTLES DRAWN AEROBIC AND ANAEROBIC Blood Culture adequate volume Performed at [facility], [address] [place], [place] [address]    Culture  Setup Time   Final    GRAM POSITIVE COCCI ANAEROBIC BOTTLE ONLY Gram Stain Report Called to,Read Back By and Verified With: HYLTON @ 1238 ON [id number] BY HENDERSON L. CRITICAL VALUE NOTED.  VALUE IS CONSISTENT WITH PREVIOUSLY REPORTED AND CALLED VALUE.    Culture (A)  Final    STAPHYLOCOCCUS AUREUS SUSCEPTIBILITIES PERFORMED ON PREVIOUS CULTURE WITHIN THE LAST 5 DAYS. Performed at [facility], [address], [place], [place] [address]    Report Status PENDING  Incomplete  Culture, blood (routine x 2)     Status: None (Preliminary result)   Collection Time: 10/25/19  2:38 PM   Specimen: Right Antecubital; Blood  Result Value Ref Range Status   Specimen Description   Final    RIGHT ANTECUBITAL BOTTLES DRAWN AEROBIC AND ANAEROBIC   Special Requests Blood Culture adequate volume  Final   Culture   Final    NO GROWTH < 24 HOURS  Performed at [facility], [address] [place], [place] [address]    Report Status PENDING  Incomplete     US Venous Img Lower Unilateral Right (DVT)  Result Date: 10/25/2019 CLINICAL DATA:  Right calf pain and edema. EXAM: RIGHT LOWER EXTREMITY VENOUS DOPPLER ULTRASOUND TECHNIQUE: Gray-scale sonography with graded compression, as well as color Doppler and duplex ultrasound were performed to evaluate the lower extremity deep venous systems from the level of the common femoral vein and including the common  femoral, femoral, profunda femoral, popliteal and calf veins including the posterior tibial, peroneal and gastrocnemius veins when visible. The superficial great saphenous vein was also interrogated. Spectral Doppler was utilized to evaluate flow at rest and with distal augmentation maneuvers in the common femoral, femoral and popliteal veins. COMPARISON:  None. FINDINGS: Contralateral Common Femoral Vein: Respiratory phasicity is normal and symmetric with the symptomatic side. No evidence of thrombus. Normal compressibility. Common Femoral Vein: No evidence of thrombus. Normal compressibility, respiratory phasicity and response to augmentation. Saphenofemoral Junction: No evidence of thrombus. Normal compressibility and flow on color Doppler imaging. Profunda Femoral Vein: No evidence of thrombus. Normal compressibility and flow on color Doppler imaging. Femoral Vein: No evidence of thrombus. Normal compressibility, respiratory phasicity and response to augmentation. Popliteal Vein: Occlusive thrombus present in the right popliteal vein. Calf Veins: Posterior tibial vein normally patent. Visualized peroneal vein demonstrates some nonocclusive thrombus in the upper calf. Superficial Great Saphenous Vein: No evidence of thrombus. Normal compressibility. Venous Reflux:  None. Other Findings: No evidence of superficial thrombophlebitis or abnormal fluid collection. IMPRESSION: Positive for right lower extremity DVT with occlusive thrombus in the popliteal vein and nonocclusive thrombus in the peroneal vein. Electronically Signed   By: Irish Lack M.D.   On: 10/25/2019 14:21   DG Foot Complete Right  Result Date: 10/24/2019 CLINICAL DATA:  Patient status post amputation of the right third metatarsal head and toe today. EXAM: RIGHT FOOT COMPLETE - 3+ VIEW COMPARISON:  Plain films the right foot 10/22/2019. FINDINGS: The right third toe and head and neck of the third metatarsal have been amputated. Surgical wound  with packing noted. The patient is status post prior amputation of the great toe and distal phalanx of the second toe. 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 No acute abnormality. Electronically Signed   By: Drusilla Kanner M.D.   On: 10/24/2019 15:33    Medications Scheduled Meds: . apixaban  5 mg Oral BID  . Chlorhexidine Gluconate Cloth  6 each Topical Q0600  . insulin aspart  0-15 Units Subcutaneous TID WC  . insulin aspart  0-5 Units Subcutaneous QHS  . insulin glargine  30 Units Subcutaneous QHS  . ondansetron (ZOFRAN) IV  4 mg Intravenous Q6H  . sodium chloride flush  3 mL Intravenous Q12H   Continuous Infusions: . amiodarone 30 mg/hr (10/26/19 1039)  .  ceFAZolin (ANCEF) IV    . diltiazem (CARDIZEM) infusion 5 mg/hr (10/26/19 1039)  . lactated ringers with KCl 20 mEq/L 125 mL/hr at 10/26/19 1039   PRN Meds:.acetaminophen **OR** acetaminophen, fentaNYL (SUBLIMAZE) injection, fluticasone, promethazine  Assessment 1.  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 2.  DVT, RLE  Plan Changed packing of R foot. Applied dressing to R foot. Prevalon boot to RLE to prevent pressure ulcer of heel. Continue NWB RLE. Continue antibiotics per ID. DVT treatment per IM.  Marius Ditch 10/26/2019, 11:04 AM

## 2019-10-26 NOTE — Progress Notes (Signed)
ANTICOAGULATION CONSULT NOTE    Pharmacy Consult for Heparin--> apixaban Indication: atrial fibrillation  No Known Allergies  Patient Measurements: Height: 6\' 4"  (193 cm) Weight: 218 lb 4.1 oz (99 kg) IBW/kg (Calculated) : 86.8 Heparin Dosing Weight: 94 kg  Vital Signs: Temp: 98.3 F (36.8 C) (12/20 0732) Temp Source: Oral (12/20 0732) BP: 143/82 (12/20 0700) Pulse Rate: 97 (12/20 0732)  Labs: Recent Labs    10/23/19 2218 10/24/19 0357 10/24/19 0822 10/25/19 0418 10/25/19 2036 10/26/19 0444  HGB   < > 12.4*  --  12.0*  --  12.6*  HCT  --  36.0*  --  36.2*  --  37.5*  PLT  --  126*  --  132*  --  185  HEPARINUNFRC  --   --  <0.10*  --  <0.10* <0.10*  CREATININE  --  0.87  --  0.69  --  0.69   < > = values in this interval not displayed.    Estimated Creatinine Clearance: 128.1 mL/min (by C-G formula based on SCr of 0.69 mg/dL).   Medical History: Past Medical History:  Diagnosis Date  . Charcot ankle   . Hypertension   . Osteomyelitis of toe of right foot (HCC)   . Recurrent boils   . Type 2 diabetes mellitus with diabetic neuropathy [facility])    Diagnosed age 53    Medications:  Medications Prior to Admission  Medication Sig Dispense Refill Last Dose  . albuterol (PROVENTIL HFA;VENTOLIN HFA) 108 (90 Base) MCG/ACT inhaler Inhale 2 puffs into the lungs every 6 (six) hours as needed for wheezing or shortness of breath. 1 Inhaler 0 Past Week at Unknown time  . aspirin (ASPIRIN EC) 81 MG EC tablet Take 81 mg by mouth daily. Swallow whole.   Past Week at Unknown time  . atorvastatin (LIPITOR) 20 MG tablet Take 1 tablet (20 mg total) by mouth daily. 90 tablet 3 Past Week at Unknown time  . cyclobenzaprine (FLEXERIL) 10 MG tablet Take 1 tablet (10 mg total) by mouth at bedtime as needed for muscle spasms. 30 tablet 3 Past Week at Unknown time  . doxycycline (VIBRA-TABS) 100 MG tablet Take 100 mg by mouth 2 (two) times daily. Started course on 10/10/2019   Past Week at  Unknown time  . DULoxetine (CYMBALTA) 60 MG capsule Take 1 capsule (60 mg total) by mouth daily. 90 capsule 3 Past Week at Unknown time  . fluticasone (FLONASE) 50 MCG/ACT nasal spray Place 1 spray into both nostrils 2 (two) times daily as needed for allergies or rhinitis. 16 g 6 Past Week at Unknown time  . gabapentin (NEURONTIN) 600 MG tablet Take 1 tablet (600 mg total) by mouth at bedtime. 90 tablet 3 Past Week at Unknown time  . HYDROcodone-acetaminophen (NORCO) 10-325 MG tablet Take 1 tablet by mouth 2 (two) times daily as needed. Do not refill until 60 days from prescription date 60 tablet 0 Past Week at Unknown time  . HYDROcodone-acetaminophen (NORCO) 10-325 MG tablet Take 1 tablet by mouth 2 (two) times daily as needed. Do not refill until 30 days from prescription date 60 tablet 0 Past Week at Unknown time  . HYDROcodone-acetaminophen (NORCO) 10-325 MG tablet Take 1 tablet by mouth 2 (two) times daily as needed for moderate pain. 60 tablet 0 Past Week at Unknown time  . ibuprofen (ADVIL,MOTRIN) 200 MG tablet Take 200 mg by mouth every 6 (six) hours as needed for mild pain or moderate pain.  Past Week at Unknown time  . Insulin Glargine, 2 Unit Dial, (TOUJEO MAX SOLOSTAR) 300 UNIT/ML SOPN Inject 40-50 Units into the skin daily. 5 pen 5 Past Week at Unknown time  . lisinopril (PRINIVIL,ZESTRIL) 5 MG tablet Take 1 tablet (5 mg total) by mouth daily. 90 tablet 3 Past Week at Unknown time  . metFORMIN (GLUCOPHAGE) 1000 MG tablet Take 1 tablet (1,000 mg total) by mouth 2 (two) times daily. 180 tablet 3 Past Week at Unknown time  . Multiple Vitamins-Minerals (MULTIVITAMIN WITH MINERALS) tablet Take 1 tablet by mouth daily.   Past Week at Unknown time  . terbinafine (LAMISIL AT) 1 % cream Apply 1 application topically 2 (two) times daily. 42 g 0 Past Week at Unknown time  . zinc gluconate 50 MG tablet Take 50 mg by mouth daily.   Past Week at Unknown time  . Continuous Blood Gluc Receiver  (FREESTYLE LIBRE 14 DAY READER) DEVI 1 each by Does not apply route daily. (Patient not taking: Reported on 10/22/2019) 1 each 0 Not Taking at Unknown time  . Continuous Blood Gluc Sensor (FREESTYLE LIBRE 14 DAY SENSOR) MISC 1 each by Other route every 14 (fourteen) days. (Patient not taking: Reported on 10/22/2019) 6 each 3 Not Taking at Unknown time    Assessment: Pharmacy consulted to transition heparin to apixaban in patient with atrial fibrillation.     Goal of Therapy:   Monitor platelets by anticoagulation protocol: Yes    Plan:  Stop heparin infusion. Start apixaban 5 mg twice daily. Continue to monitor H&H and platelets.  Margot Ables, PharmD Clinical Pharmacist 10/26/2019 8:15 AM

## 2019-10-26 NOTE — Progress Notes (Signed)
Pharmacy Antibiotic Note  [name] is a 53 y.o. male admitted on 10/22/2019 with MSSA bacteremia and wound infection.  Pharmacy has been consulted for Cefazolin dosing.  Plan: Cefazolin 2000 mg IV every 8 hours. Monitor labs, c/s, and patient improvement.  Height: 6\' 4"  (193 cm) Weight: 218 lb 4.1 oz (99 kg) IBW/kg (Calculated) : 86.8  Temp (24hrs), Avg:98.3 F (36.8 C), Min:97.4 F (36.3 C), Max:99.1 F (37.3 C)  Recent Labs  Lab 10/22/19 1520 10/22/19 1706 10/23/19 0359 10/24/19 0357 10/24/19 1832 10/25/19 0418 10/25/19 1438 10/26/19 0444  WBC 12.1*  --  7.7 8.0  --  9.8  --  11.9*  CREATININE 1.42*  --  0.97 0.87  --  0.69  --  0.69  LATICACIDVEN 3.8* 2.3* 2.2*  --  2.2*  --  1.7  --   VANCOTROUGH  --   --   --   --   --  11*  --   --     Estimated Creatinine Clearance: 128.1 mL/min (by C-G formula based on SCr of 0.69 mg/dL).    No Known Allergies  Antimicrobials this admission: vancomycin 12/16 >> 12/20 Zosyn 12/16 >> 12/17 Cefepime 12/18 >>12/120 Cefazolin 12/20>>   Microbiology results: 12/19 Bcx: ngtd 12/17 Bcx: ngtd 12/16 BCx: staph aureus MSSA 12/16 UCx: insignificant 12/16 Cdiff: neg 12/16 MRSA PCR: neg  Thank you for allowing pharmacy to be a part of this patient's care.  Ramond Craver 10/26/2019 10:42 AM

## 2019-10-26 NOTE — Progress Notes (Signed)
PROGRESS NOTE  [name] NGE:[id number] DOB: [birth year] DOA: 10/22/2019 PCP: Dettinger, [name], MD  Brief History: 53 year old male with a history of diabetes mellitus type 2, diabetic polyneuropathy, hypertension, left BKA, and right toe amputations secondary to diabetic foot infections presenting with 1 day history of increasing erythema, edema, and pain on his right foot. The patient stated that his podiatrist, Dr. [name] started him on 10 days of doxycycline on 10/10/2019 for his diabetic foot ulcer on his right fourth toe on 10/10/2019.The patient is a bit vague regarding the history of his most recent foot ulcer on his right fourth toe, but stated that he has noted some intermittent drainage in the past 1 to 2 days prior to admission. He had denied any fevers, chills, headache, neck pain, chest pain, shortness breath, cough, hemoptysis, abdominal pain, dysuria, hematuria. The patient did have episodes of nausea and vomiting for the past 3 days. He is also noted some loose stools without any hematochezia or melena. Because of his nausea, vomiting, and right foot erythema and pain he presented for further evaluation. In the ED, the patient had temperature 100.6 F. He was noted to have atrial fibrillation with RVR with heart rates into the 180s. The patient was hemodynamically stable with oxygen saturation 96-97% on room air. WBC was 12.1 with hemoglobin 16.0 and platelets 176K.Sodium was 125 with serum creatinine 1.42. Lactic acid peaked at 3.8. The patient was fluid resuscitated and started on vancomycin and Zosyn. Blood cultures from the emergency department were also obtained.  Assessment/Plan: Sepsis with organ dysfunction -Secondary to diabetic foot infection and bacteremia -Lactic acid peaked 3.8 -check PCT10.37>>>7.96>>>6.70 -Continue vancomycin -Discontinue Zosyn -Continue IV fluids -SARS-CoV2--neg -10/24/19 spike fever 3 hours  post-op  Diabetic foot infection/cellulitis right foot/Osteomyelitis toe -Podiatry, Dr. [name] has been consulted -MRI foot--osteomyelitis of R-third toe with diffuse cellulitis and myositis -CRP 40.7 -MWN02 -10/24/19--Right third ray amputation -10/24/19--bone culture--S. aureus  Bacteremia--Staph aureus-OSSA -Source is likely foot infection -12/17 blood culture--OSSA -12/18 blood cultures--OSSA -12/29 blood culture--neg -Discontinue vanco -Discontinue Zosyn -start cefazolin--plan 2 weeks beyond last negative blood culture -planning TEE 12/21 or 12/22  Right popliteal and peroneal DVT -10/25/19 venous duplex +DVT -already on heparin IV>>>apixaban  DKA, type 2 -pt presented with serum glucose 389 with anion gap 15 and ketonuria -resolved with aggressive fluid resuscitation -transition to Reserve insulin  AKI -Resulting from sepsis -Baseline creatinine 0.7-1.0 -Serum creatinine peaked 1.42 -A.m. BMP  Atrial fibrillation with RVR, type unspecified -CHADS-VASc = 3 (HTN, DM2, PAD) -ContinueIV heparin in anticipation for possible surgery on foot -Echo--EF 70-75%, no WMA, trace MR/TR, mild increase PASP -TSH--0.929 -switch to po amiodarone -switch to po dilitazem -appreciate cardiology followup  Uncontrolled diabetes mellitus type 2 with hyperglycemia -Anticipate elevated CBGs secondary to sepsis -07/22/2019 hemoglobin A1c 12.4 -Increase Lantus to 30 units hs -NovoLog sliding scale -Holding metformin -12/17/20A1C--9.8  Hypokalemia/hypomagnesemia -Replete--give more IV mag today -Add potassium to IV fluids  Hyponatremia -Multifactorial including hyperglycemia, volume depletion, AKI -switch to IV NS today  Intractable nausea and vomiting -Suspect a component of gastritis/diabetic gastroparesis incited by sepsisand DKA -improved -advance to carb modified diet  Diarrhea -C. difficile negative -If persistent, check stool pathogen panel  Essential  hypertension -Blood pressure currently controlled on diltiazem drip -Holding lisinopril secondary to AKI  Hyperlipidemia -Continue statinwhen able to tolerate po  Left arm pain -venous duplex        Disposition Plan:Remain in ICU Family Communication:NoFamily at  bedside  Consultants:Cardiology, podiatry  Code Status: FULL  DVT Prophylaxis:IVHeparin    Procedures: As Listed in Progress Note Above  Antibiotics: Cefazolin 12/20>>> vanco 12/16>>>12/20 Cefepime 12/18>>>12/20 Zosyn 12/16>>>12/17   Total time spent 35 minutes.  Greater than 50% spent face to face counseling and coordinating care.      Subjective: Pt complains of pain in right calf.  Denies f/c, cp, sob, n/v/d, abd pain.  Pain in right foot is controlled  Objective: Vitals:   10/26/19 0732 10/26/19 0800 10/26/19 0900 10/26/19 0930  BP:  (!) 148/81 (!) 148/83   Pulse: 97 (!) 103 100 100  Resp: (!) 22 (!) 21 (!) 31 (!) 27  Temp: 98.3 F (36.8 C)     TempSrc: Oral     SpO2: 98% 97% 99% 98%  Weight:      Height:        Intake/Output Summary (Last 24 hours) at 10/26/2019 1236 Last data filed at 10/26/2019 1039 Gross per 24 hour  Intake 4998.77 ml  Output 1980 ml  Net 3018.77 ml   Weight change: -0.3 kg Exam:   General:  Pt is alert, follows commands appropriately, not in acute distress  HEENT: No icterus, No thrush, No neck mass, [place]/AT  Cardiovascular: IRRR, S1/S2, no rubs, no gallops  Respiratory: CTA bilaterally, no wheezing, no crackles, no rhonchi  Abdomen: Soft/+BS, non tender, non distended, no guarding  Extremities: right foot bandaged.  + calf pain   Data Reviewed: I have personally reviewed following labs and imaging studies Basic Metabolic Panel: Recent Labs  Lab 10/22/19 1520 10/23/19 0359 10/24/19 0357 10/25/19 0418 10/26/19 0444  NA 125* 132* 131* 132* 130*  K 3.4* 3.3* 3.8 3.9 3.5  CL 89* 98 96* 98 96*  CO2 21* _0 GLUCOSE 389* 266* 242* 211* 198*  BUN 40* 31* 25* 20 18  CREATININE 1.42* 0.97 0.87 0.69 0.69  CALCIUM 8.8* 8.5* 8.2* 8.0* 7.8*  MG 1.5* 1.7 1.8 1.8 1.6*   Liver Function Tests: Recent Labs  Lab 10/22/19 1520 10/23/19 0359 10/24/19 0357  AST _1 ALT _2 ALKPHOS 123 83 69  BILITOT 2.0* 1.6* 1.3*  PROT 7.5 6.0* 5.4*  ALBUMIN 3.4* 2.6* 2.1*   No results for input(s): LIPASE, AMYLASE in the last 168 hours. No results for input(s): AMMONIA in the last 168 hours. Coagulation Profile: Recent Labs  Lab 10/22/19 1520  INR 1.3*   CBC: Recent Labs  Lab 10/22/19 1520 10/23/19 0359 10/24/19 0357 10/25/19 0418 10/26/19 0444  WBC 12.1* 7.7 8.0 9.8 11.9*  NEUTROABS 10.2* 6.3  --   --   --   HGB 16.0 13.8 12.4* 12.0* 12.6*  HCT 46.1 39.0 36.0* 36.2* 37.5*  MCV 88.0 87.8 87.8 91.6 88.9  PLT 176 123* 126* 132* 185   Cardiac Enzymes: No results for input(s): CKTOTAL, CKMB, CKMBINDEX, TROPONINI in the last 168 hours. BNP: Invalid input(s): POCBNP CBG: Recent Labs  Lab 10/25/19 1202 10/25/19 1633 10/25/19 2124 10/26/19 0731 10/26/19 1120  GLUCAP 184* 216* 177* 155* 140*   HbA1C: No results for input(s): HGBA1C in the last 72 hours. Urine analysis:    Component Value Date/Time   COLORURINE YELLOW 10/22/2019 1508   APPEARANCEUR HAZY (A) 10/22/2019 1508   LABSPEC 1.017 10/22/2019 1508   PHURINE 5.0 10/22/2019 1508   GLUCOSEU >=500 (A) 10/22/2019 1508   HGBUR SMALL (A) 10/22/2019 1508   BILIRUBINUR NEGATIVE 10/22/2019 1508   BILIRUBINUR negative  03/26/2014 1212   KETONESUR 5 (A) 10/22/2019 1508   PROTEINUR NEGATIVE 10/22/2019 1508   UROBILINOGEN 0.2 06/13/2014 1048   NITRITE NEGATIVE 10/22/2019 1508   LEUKOCYTESUR NEGATIVE 10/22/2019 1508   Sepsis Labs: _0 (procalcitonin:4,lacticidven:4) ) Recent Results (from the past 240 hour(s))  Urine culture     Status: Abnormal   Collection Time: 10/22/19  3:08 PM   Specimen: In/Out Cath Urine  Result  Value Ref Range Status   Specimen Description   Final    IN/OUT CATH URINE Performed at [facility], [address] [place], [place] [address]    Special Requests   Final    NONE Performed at [facility], [address], [place], [place] [address]    Culture (A)  Final    <10,000 COLONIES/mL INSIGNIFICANT GROWTH Performed at [facility], [place] [address] [place], [place] [address]    Report Status 10/23/2019 FINAL  Final  Blood Culture (routine x 2)     Status: Abnormal   Collection Time: 10/22/19  3:20 PM   Specimen: BLOOD LEFT WRIST  Result Value Ref Range Status   Specimen Description   Final    BLOOD LEFT WRIST Performed at [facility], [address], [place], [place] [address]    Special Requests   Final    BOTTLES DRAWN AEROBIC AND ANAEROBIC Blood Culture adequate volume Performed at [facility], [address], [place], [place] [address]    Culture  Setup Time   Final    GRAM POSITIVE COCCI BOTH ANAEROBIC AND AEROBIC BOTTLES Gram Stain Report Called to,Read Back By and Verified With: HEARN,J_1  BY MATTHEWS, B 12.17.2020 [facility] Performed at [facility], [address] [place], [place] [address]    Culture STAPHYLOCOCCUS AUREUS (A)  Final   Report Status 10/26/2019 FINAL  Final   Organism ID, Bacteria STAPHYLOCOCCUS AUREUS  Final      Susceptibility   Staphylococcus aureus - MIC*    CIPROFLOXACIN >=8 RESISTANT Resistant     ERYTHROMYCIN >=8 RESISTANT Resistant     GENTAMICIN <=0.5 SENSITIVE Sensitive     OXACILLIN 0.5 SENSITIVE Sensitive     TETRACYCLINE >=16 RESISTANT Resistant     VANCOMYCIN 1 SENSITIVE Sensitive     TRIMETH/SULFA <=10 SENSITIVE Sensitive     CLINDAMYCIN RESISTANT Resistant     RIFAMPIN <=0.5 SENSITIVE Sensitive     Inducible Clindamycin POSITIVE Resistant     * STAPHYLOCOCCUS AUREUS  Blood Culture (routine x 2)     Status: Abnormal   Collection Time: 10/22/19  3:31 PM   Specimen: Right Antecubital; Blood  Result Value  Ref Range Status   Specimen Description   Final    RIGHT ANTECUBITAL Performed at [facility], [address] [place], [place] [address]    Special Requests   Final    BOTTLES DRAWN AEROBIC AND ANAEROBIC Blood Culture adequate volume Performed at [facility], [address], [place], [place] [address]    Culture  Setup Time   Final    GRAM POSITIVE COCCI ANAEROBIC AND AEROBIC BOTTLES Gram Stain Report Called to,Read Back By and Verified With: HEARN,J_2  BY MATTHEWS B 12.17.2020 [facility] Performed at [facility], [address], [place], [place] [address]    Culture (A)  Final    STAPHYLOCOCCUS AUREUS SUSCEPTIBILITIES PERFORMED ON PREVIOUS CULTURE WITHIN THE LAST 5 DAYS. Performed at [facility], [place] [address], [place],  [address]    Report Status 10/26/2019 FINAL  Final  SARS CORONAVIRUS 2 (Manuel [place] 6-24 HRS) Nasopharyngeal Nasopharyngeal Swab     Status: None   Collection Time: 10/22/19  4:14 PM   Specimen: Nasopharyngeal Swab  Result Value Ref Range Status   SARS Coronavirus 2 NEGATIVE NEGATIVE Final    Comment: (NOTE) SARS-CoV-2 target nucleic acids are NOT DETECTED. The SARS-CoV-2 RNA is generally detectable in upper and lower respiratory specimens during the acute phase of infection. Negative results do not preclude SARS-CoV-2 infection, do not rule out co-infections with other pathogens, and should not be used as the sole basis for treatment or other patient management decisions. Negative results must be combined with clinical observations, patient history, and epidemiological information. The expected result is Negative. Fact Sheet for Patients: SugarRoll.be Fact Sheet for Healthcare Providers: https://www.woods-mathews.com/ This test is not yet approved or cleared by the [place] FDA and  has been authorized for detection and/or diagnosis of SARS-CoV-2 by FDA under an Emergency Use Authorization  (EUA). This EUA will remain  in effect (meaning this test can be used) for the duration of the COVID-19 declaration under Section 56 4(b)(1) of the Act, 21 U.S.C. section 360bbb-3(b)(1), unless the authorization is terminated or revoked sooner. Performed at [facility], [place] [address], [place], [place] [address]   MRSA PCR Screening     Status: None   Collection Time: 10/22/19  8:11 PM   Specimen: Nasal Mucosa; Nasopharyngeal  Result Value Ref Range Status   MRSA by PCR NEGATIVE NEGATIVE Final    Comment:        The GeneXpert MRSA Assay (FDA approved for NASAL specimens only), is one component of a comprehensive MRSA colonization surveillance program. It is not intended to diagnose MRSA infection nor to guide or monitor treatment for MRSA infections. Performed at [facility], [address], [place], [place] [address]   C difficile quick scan w PCR reflex     Status: None   Collection Time: 10/22/19 11:56 PM   Specimen: STOOL  Result Value Ref Range Status   C Diff antigen NEGATIVE NEGATIVE Final   C Diff toxin NEGATIVE NEGATIVE Final   C Diff interpretation No C. difficile detected.  Final    Comment: Performed at [facility], [address], [place], [place] [address]  Culture, blood (routine x 2)     Status: None (Preliminary result)   Collection Time: 10/23/19  8:44 AM   Specimen: BLOOD RIGHT HAND  Result Value Ref Range Status   Specimen Description BLOOD RIGHT HAND  Final   Special Requests   Final    BOTTLES DRAWN AEROBIC AND ANAEROBIC Blood Culture adequate volume   Culture   Final    NO GROWTH 3 DAYS Performed at [facility], [address] [place], [place] [address]    Report Status PENDING  Incomplete  Culture, blood (routine x 2)     Status: None (Preliminary result)   Collection Time: 10/23/19  8:46 AM   Specimen: BLOOD RIGHT HAND  Result Value Ref Range Status   Specimen Description BLOOD RIGHT HAND  Final   Special Requests   Final    BOTTLES  DRAWN AEROBIC AND ANAEROBIC Blood Culture results may not be optimal due to an excessive volume of blood received in culture bottles   Culture   Final    NO GROWTH 3 DAYS Performed at [facility], [address], [place],  [address]    Report Status PENDING  Incomplete  Aerobic/Anaerobic Culture (surgical/deep wound)  Status: None (Preliminary result)   Collection Time: 10/24/19  2:20 PM   Specimen: Soft Tissue, Other  Result Value Ref Range Status   Specimen Description TOE SOFT TISSUE AND BONE OF 3RD  Final   Special Requests NONE  Final   Gram Stain NO WBC SEEN FEW GRAM POSITIVE COCCI   Final   Culture MODERATE STAPHYLOCOCCUS AUREUS  Final   Report Status PENDING  Incomplete   Organism ID, Bacteria STAPHYLOCOCCUS AUREUS  Final      Susceptibility   Staphylococcus aureus - MIC*    CIPROFLOXACIN >=8 RESISTANT Resistant     ERYTHROMYCIN >=8 RESISTANT Resistant     GENTAMICIN <=0.5 SENSITIVE Sensitive     OXACILLIN 0.5 SENSITIVE Sensitive     TETRACYCLINE >=16 RESISTANT Resistant     VANCOMYCIN <=0.5 SENSITIVE Sensitive     TRIMETH/SULFA <=10 SENSITIVE Sensitive     CLINDAMYCIN RESISTANT Resistant     RIFAMPIN <=0.5 SENSITIVE Sensitive     Inducible Clindamycin Value in next row Resistant      POSITIVEPerformed at [place] [address] [place], [place] [address]    * MODERATE STAPHYLOCOCCUS AUREUS  Culture, blood (routine x 2)     Status: Abnormal (Preliminary result)   Collection Time: 10/24/19  6:33 PM   Specimen: BLOOD RIGHT ARM  Result Value Ref Range Status   Specimen Description   Final    BLOOD RIGHT ARM Performed at [facility], [address] [place], [place] [address]    Special Requests   Final    BOTTLES DRAWN AEROBIC AND ANAEROBIC Blood Culture adequate volume Performed at [place] [place], [place] [address]    Culture  Setup Time   Final    GRAM POSITIVE COCCI ANAEROBIC BOTTLE ONLY Gram Stain Report Called to,Read  Back By and Verified With: [email or web address] [id number] ON [id number] BY HENDERSON L. CRITICAL RESULT CALLED TO, READ BACK BY AND VERIFIED WITH: E. MURPHY, RN (APH) AT 4332 ON 10/25/19 BY C. JESSUP, MT.    Culture (A)  Final    STAPHYLOCOCCUS AUREUS SUSCEPTIBILITIES PERFORMED ON PREVIOUS CULTURE WITHIN THE LAST 5 DAYS. Performed at [facility], [place] [address] [place], [place] [address]    Report Status PENDING  Incomplete  Blood Culture ID Panel (Reflexed)     Status: Abnormal   Collection Time: 10/24/19  6:33 PM  Result Value Ref Range Status   Enterococcus species NOT DETECTED NOT DETECTED Final   Listeria monocytogenes NOT DETECTED NOT DETECTED Final   Staphylococcus species DETECTED (A) NOT DETECTED Final    Comment: CRITICAL RESULT CALLED TO, READ BACK BY AND VERIFIED WITH: E. MURPHY, RN (APH) AT 1820 ON 10/25/19 BY C. JESSUP, MT.    Staphylococcus aureus (BCID) DETECTED (A) NOT DETECTED Final    Comment: Methicillin (oxacillin) susceptible Staphylococcus aureus (MSSA). Preferred therapy is anti staphylococcal beta lactam antibiotic (Cefazolin or Nafcillin), unless clinically contraindicated. CRITICAL RESULT CALLED TO, READ BACK BY AND VERIFIED WITH: E. MURPHY, RN (APH) AT 4166 ON 10/25/19 BY C. JESSUP, MT.    Methicillin resistance NOT DETECTED NOT DETECTED Final   Streptococcus species NOT DETECTED NOT DETECTED Final   Streptococcus agalactiae NOT DETECTED NOT DETECTED Final   Streptococcus pneumoniae NOT DETECTED NOT DETECTED Final   Streptococcus pyogenes NOT DETECTED NOT DETECTED Final   Acinetobacter baumannii NOT DETECTED NOT DETECTED Final   Enterobacteriaceae species NOT DETECTED NOT DETECTED Final   Enterobacter cloacae complex NOT DETECTED NOT DETECTED  Final   Escherichia coli NOT DETECTED NOT DETECTED Final   Klebsiella oxytoca NOT DETECTED NOT DETECTED Final   Klebsiella pneumoniae NOT DETECTED NOT DETECTED Final   Proteus species NOT DETECTED NOT DETECTED Final   Serratia  marcescens NOT DETECTED NOT DETECTED Final   Haemophilus influenzae NOT DETECTED NOT DETECTED Final   Neisseria meningitidis NOT DETECTED NOT DETECTED Final   Pseudomonas aeruginosa NOT DETECTED NOT DETECTED Final   Candida albicans NOT DETECTED NOT DETECTED Final   Candida glabrata NOT DETECTED NOT DETECTED Final   Candida krusei NOT DETECTED NOT DETECTED Final   Candida parapsilosis NOT DETECTED NOT DETECTED Final   Candida tropicalis NOT DETECTED NOT DETECTED Final    Comment: Performed at [facility], [place] [address], [place], [place] [address]  Culture, blood (routine x 2)     Status: Abnormal (Preliminary result)   Collection Time: 10/24/19  6:38 PM   Specimen: BLOOD RIGHT HAND  Result Value Ref Range Status   Specimen Description   Final    BLOOD RIGHT HAND Performed at [facility], [address] [place], [place] [address]    Special Requests   Final    BOTTLES DRAWN AEROBIC AND ANAEROBIC Blood Culture adequate volume Performed at [facility], [address], [place], [place] [address]    Culture  Setup Time   Final    GRAM POSITIVE COCCI ANAEROBIC BOTTLE ONLY Gram Stain Report Called to,Read Back By and Verified With: HYLTON @ 2409 ON [id number] BY HENDERSON L. CRITICAL VALUE NOTED.  VALUE IS CONSISTENT WITH PREVIOUSLY REPORTED AND CALLED VALUE.    Culture (A)  Final    STAPHYLOCOCCUS AUREUS SUSCEPTIBILITIES PERFORMED ON PREVIOUS CULTURE WITHIN THE LAST 5 DAYS. Performed at [facility], [place] [address], [place], [place] [address]    Report Status PENDING  Incomplete  Culture, blood (routine x 2)     Status: None (Preliminary result)   Collection Time: 10/25/19  2:38 PM   Specimen: Right Antecubital; Blood  Result Value Ref Range Status   Specimen Description   Final    RIGHT ANTECUBITAL BOTTLES DRAWN AEROBIC AND ANAEROBIC   Special Requests Blood Culture adequate volume  Final   Culture   Final    NO GROWTH < 24 HOURS Performed at [facility], [address], [place],  [address]    Report Status PENDING  Incomplete     Scheduled Meds: . apixaban  5 mg Oral BID  . Chlorhexidine Gluconate Cloth  6 each Topical Q0600  . insulin aspart  0-15 Units Subcutaneous TID WC  . insulin aspart  0-5 Units Subcutaneous QHS  . insulin glargine  30 Units Subcutaneous QHS  . ondansetron (ZOFRAN) IV  4 mg Intravenous Q6H  . sodium chloride flush  3 mL Intravenous Q12H   Continuous Infusions: . amiodarone 30 mg/hr (10/26/19 1039)  .  ceFAZolin (ANCEF) IV 2 g (10/26/19 1140)  . diltiazem (CARDIZEM) infusion 5 mg/hr (10/26/19 1039)  . lactated ringers with KCl 20 mEq/L 125 mL/hr at 10/26/19 1039  . magnesium sulfate bolus IVPB      Procedures/Studies: DG Tibia/Fibula Right  Result Date: 10/22/2019 CLINICAL DATA:  Right leg pain and tenderness. Fever. Clinical evidence of infection. Diabetes. EXAM: RIGHT TIBIA AND FIBULA - 2 VIEW COMPARISON:  Right ankle radiographs obtained at the same time. FINDINGS: Mild diffuse soft tissue swelling. Previously described chronic appearing periosteal reaction involving the distal tibia. On these radiographs, this is involving the  medial and posterior aspects of the tibia. Also again noted are previously described bone collapse and fragmentation involving the talotibial joint and flattening of the normal plantar arch. No soft tissue gas is seen. Mild medial and lateral spur formation at the knee joint with mild to moderate medial joint space narrowing. IMPRESSION: 1. Chronic appearing periosteal reaction involving the posterior and medial aspects of the distal tibia. Underlying chronic osteomyelitis is a possibility. 2. Diffuse soft tissue swelling without gas or bone destruction. 3. 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 Electronically Signed   By: Claudie Revering M.D.   On: 10/22/2019 16:05   DG Ankle Complete Right  Result Date: 10/22/2019 CLINICAL DATA:  Right leg pain and tenderness and fever  with clinical evidence of infection. Diabetes. EXAM: RIGHT ANKLE - COMPLETE 3+ VIEW COMPARISON:  Right foot radiographs obtained today and on 06/19/2016. FINDINGS: Diffuse soft tissue swelling. Bone collapse and fragmentation at the talotibial joint, described on the foot radiographs, in addition to previously described flattening of the normal plantar arch. Again demonstrated are is chronic appearing posterior periosteal reaction involving the distal tibia. No bone destruction or soft tissue gas seen. IMPRESSION: 1. Diffuse soft tissue swelling without underlying changes of acute osteomyelitis. 2. Neuropathic changes at the talotibial joint. 3. Pes planus. Electronically Signed   By: Claudie Revering M.D.   On: 10/22/2019 16:02   MR FOOT RIGHT W WO CONTRAST  Result Date: 10/23/2019 CLINICAL DATA:  Nonhealing ulcer involving the third toe. History of prior amputations. EXAM: MRI OF THE RIGHT FOREFOOT WITHOUT AND WITH CONTRAST TECHNIQUE: Multiplanar, multisequence MR imaging of the right foot was performed before and after the administration of intravenous contrast. CONTRAST:  39m GADAVIST GADOBUTROL 1 MMOL/ML IV SOLN COMPARISON:  Radiographs 10/22/2019 FINDINGS: Abnormal T1 and T2 signal intensity in the proximal phalanx of the third toe and also in what appears to be a small residual portion of the middle phalanx. There is also associated abnormal contrast enhancement and surrounding cellulitis. Findings consistent with osteomyelitis. No other definite sites of acute osteomyelitis are identified. Evidence of prior amputations involving the first and fifth toes and parts of the other toes. The metatarsal bones are intact. No findings for septic arthritis involving the remaining MTP joints or involving the midfoot joint spaces. There is moderate edema like signal abnormality in the navicular bone along with some enhancement. Could not exclude osteomyelitis. Severe diffuse cellulitis involving the entire midfoot and  forefoot. Mild diffuse myositis without findings for pyomyositis. IMPRESSION: 1. Osteomyelitis involving the third digit as detailed above. 2. Abnormal signal intensity and enhancement in the navicular bone. This is not completely covered but could not exclude osteomyelitis. 3. Severe diffuse cellulitis and mild diffuse myositis. Electronically Signed   By: PMarijo SanesM.D.   On: 10/23/2019 16:50   UKoreaVenous Img Lower Unilateral Right (DVT)  Result Date: 10/25/2019 CLINICAL DATA:  Right calf pain and edema. EXAM: RIGHT LOWER EXTREMITY VENOUS DOPPLER ULTRASOUND TECHNIQUE: Gray-scale sonography with graded compression, as well as color Doppler and duplex ultrasound were performed to evaluate the lower extremity deep venous systems from the level of the common femoral vein and including the common femoral, femoral, profunda femoral, popliteal and calf veins including the posterior tibial, peroneal and gastrocnemius veins when visible. The superficial great saphenous vein was also interrogated. Spectral Doppler was utilized to evaluate flow at rest and with distal augmentation maneuvers in the common femoral, femoral and popliteal veins. COMPARISON:  None. FINDINGS: Contralateral  Common Femoral Vein: Respiratory phasicity is normal and symmetric with the symptomatic side. No evidence of thrombus. Normal compressibility. Common Femoral Vein: No evidence of thrombus. Normal compressibility, respiratory phasicity and response to augmentation. Saphenofemoral Junction: No evidence of thrombus. Normal compressibility and flow on color Doppler imaging. Profunda Femoral Vein: No evidence of thrombus. Normal compressibility and flow on color Doppler imaging. Femoral Vein: No evidence of thrombus. Normal compressibility, respiratory phasicity and response to augmentation. Popliteal Vein: Occlusive thrombus present in the right popliteal vein. Calf Veins: Posterior tibial vein normally patent. Visualized peroneal vein  demonstrates some nonocclusive thrombus in the upper calf. Superficial Great Saphenous Vein: No evidence of thrombus. Normal compressibility. Venous Reflux:  None. Other Findings: No evidence of superficial thrombophlebitis or abnormal fluid collection. IMPRESSION: Positive for right lower extremity DVT with occlusive thrombus in the popliteal vein and nonocclusive thrombus in the peroneal vein. Electronically Signed   By: Aletta Edouard M.D.   On: 10/25/2019 14:21   DG Chest Port 1 View  Result Date: 10/22/2019 CLINICAL DATA:  Fever EXAM: PORTABLE CHEST 1 VIEW COMPARISON:  12/09/2013 chest radiograph. FINDINGS: Stable cardiomediastinal silhouette with top-normal heart size. No pneumothorax. No pleural effusion. Lungs appear clear, with no acute consolidative airspace disease and no pulmonary edema. IMPRESSION: No active disease. Electronically Signed   By: Ilona Sorrel M.D.   On: 10/22/2019 15:55   DG Foot Complete Right  Result Date: 10/24/2019 CLINICAL DATA:  Patient status post amputation of the right third metatarsal head and toe today. EXAM: RIGHT FOOT COMPLETE - 3+ VIEW COMPARISON:  Plain films the right foot 10/22/2019. FINDINGS: The right third toe and head and neck of the third metatarsal have been amputated. Surgical wound with packing noted. The patient is status post prior amputation of the great toe and distal phalanx of the second toe. 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 No acute abnormality. Electronically Signed   By: Inge Rise M.D.   On: 10/24/2019 15:33   DG Foot Complete Right  Result Date: 10/22/2019 CLINICAL DATA:  Right foot pain and fever. Diabetes. Clinical infection. EXAM: RIGHT FOOT COMPLETE - 3+ VIEW COMPARISON:  06/19/2016. FINDINGS: Diffuse soft tissue swelling. Again demonstrated surgical absence of the right great toe with interval surgical absence of the right little toe. Well-defined bone margins. No  periosteal reaction or bone destruction involving the bones of the foot. There is flattening of the normal plantar arch and interval partial bone collapse and fragmentation involving the distal tibia and talus. There is also chronic appearing periosteal thickening and irregularity involving the distal tibia posteriorly. IMPRESSION: 1. Diffuse soft tissue swelling without soft tissue gas or evidence of underlying osteomyelitis. 2. Pes planus. 3. 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 Electronically Signed   By: Claudie Revering M.D.   On: 10/22/2019 15:59   ECHOCARDIOGRAM COMPLETE  Result Date: 10/23/2019   ECHOCARDIOGRAM REPORT   Patient Name:   JERID CATHERMAN Date of Exam: 10/23/2019 Medical Rec #:  [id number]     Height:       76.0 in Accession #:    [id number]    Weight:       206.8 lb Date of Birth:  1966-10-12    BSA:          2.25 m Patient Age:    22 years      BP:  99/54 mmHg Patient Gender: M             HR:           120 bpm. Exam Location:  [facility] Procedure: 2D Echo, Cardiac Doppler and Color Doppler Indications:    Atrial Fibrillation 427.31 / I48.91  History:        Patient has no prior history of Echocardiogram examinations.                 Arrythmias:Atrial Fibrillation; Risk Factors:Diabetes and                 Hypertension.  Sonographer:    Alvino Chapel RCS Referring Phys: [phone] [place] IMPRESSIONS  1. Left ventricular ejection fraction, by visual estimation, is 70 to 75%. The left ventricle has hyperdynamic function. There is mildly increased left ventricular hypertrophy.  2. Left ventricular diastolic parameters are indeterminate.  3. The left ventricle has no regional wall motion abnormalities.  4. Global right ventricle has normal systolic function.The right ventricular size is normal. No increase in right ventricular wall thickness.  5. Left atrial size was normal.  6. Right atrial size was normal.  7. The mitral valve is grossly normal.  Trivial mitral valve regurgitation.  8. The tricuspid valve is grossly normal. Tricuspid valve regurgitation is trivial.  9. The aortic valve is tricuspid. Aortic valve regurgitation is not visualized. 10. The pulmonic valve was grossly normal. Pulmonic valve regurgitation is not visualized. 11. Mildly elevated pulmonary artery systolic pressure. 12. The tricuspid regurgitant velocity is 3.04 m/s, and with an assumed right atrial pressure of 3 mmHg, the estimated right ventricular systolic pressure is mildly elevated at 40.0 mmHg. 13. The inferior vena cava is normal in size with greater than 50% respiratory variability, suggesting right atrial pressure of 3 mmHg. FINDINGS  Left Ventricle: Left ventricular ejection fraction, by visual estimation, is 70 to 75%. The left ventricle has hyperdynamic function. The left ventricle has no regional wall motion abnormalities. The left ventricular internal cavity size was the left ventricle is normal in size. There is mildly increased left ventricular hypertrophy. Septal left ventricular hypertrophy. Left ventricular diastolic parameters are indeterminate. Right Ventricle: The right ventricular size is normal. No increase in right ventricular wall thickness. Global RV systolic function is has normal systolic function. The tricuspid regurgitant velocity is 3.04 m/s, and with an assumed right atrial pressure  of 3 mmHg, the estimated right ventricular systolic pressure is mildly elevated at 40.0 mmHg. Left Atrium: Left atrial size was normal in size. Right Atrium: Right atrial size was normal in size Pericardium: There is no evidence of pericardial effusion. Mitral Valve: The mitral valve is grossly normal. Trivial mitral valve regurgitation. Tricuspid Valve: The tricuspid valve is grossly normal. Tricuspid valve regurgitation is trivial. Aortic Valve: The aortic valve is tricuspid. Aortic valve regurgitation is not visualized. Pulmonic Valve: The pulmonic valve was grossly  normal. Pulmonic valve regurgitation is not visualized. Pulmonic regurgitation is not visualized. Aorta: The aortic root is normal in size and structure. Venous: The inferior vena cava is normal in size with greater than 50% respiratory variability, suggesting right atrial pressure of 3 mmHg. IAS/Shunts: No atrial level shunt detected by color flow Doppler.  LEFT VENTRICLE PLAX 2D LVIDd:         4.44 cm LVIDs:         2.56 cm LV PW:         1.00 cm LV IVS:  1.26 cm LVOT diam:     2.00 cm LV SV:         66 ml LV SV Index:   29.33 LVOT Area:     3.14 cm  RIGHT VENTRICLE TAPSE (M-mode): 1.7 cm LEFT ATRIUM             Index       RIGHT ATRIUM           Index LA diam:        3.30 cm 1.47 cm/m  RA Area:     20.60 cm LA Vol (A2C):   52.0 ml 23.14 ml/m RA Volume:   57.20 ml  25.46 ml/m LA Vol (A4C):   40.9 ml 18.20 ml/m LA Biplane Vol: 47.2 ml 21.01 ml/m  AORTIC VALVE LVOT Vmax:   129.00 cm/s LVOT Vmean:  82.933 cm/s LVOT VTI:    0.185 m  AORTA Ao Root diam: 3.30 cm MITRAL VALVE                        TRICUSPID VALVE MV Area (PHT): 3.21 cm             TR Peak grad:   37.0 mmHg MV PHT:        68.44 msec           TR Vmax:        304.00 cm/s MV Decel Time: 236 msec MV E velocity: 107.00 cm/s 103 cm/s SHUNTS                                     Systemic VTI:  0.19 m                                     Systemic Diam: 2.00 cm  Rozann Lesches MD Electronically signed by Rozann Lesches MD Signature Date/Time: 10/23/2019/9:39:01 AM    Final     Orson Eva, DO  [facility] Pager [phone]  If 7PM-7AM, please contact night-coverage [email or web address] Password [email or web address] 10/26/2019, 12:36 PM   LOS: 4 days

## 2019-10-26 NOTE — Progress Notes (Signed)
Patient ID: [name], male   DOB: 1966-06-08, 53 y.o.   MRN: [id number]          [facility]    Date of Admission:  10/22/2019   Day 5 vancomycin        Day 3 cefepime  He was admitted with MSSA infection of his right third toe which is now been amputated.  His infection is complicated by MSSA bacteremia.  Repeat blood cultures on 10/23/2019 remain negative but repeat blood cultures on 10/24/2019 shortly after surgery are positive again.  I will change antibiotics to IV cefazolin and repeat blood cultures now.  I agree with TEE this week to help determine optimal duration of antibiotic therapy.         Michel Bickers, MD [facility] [place] [facility] [phone] pager   [phone] cell 10/26/2019, 10:26 AM

## 2019-10-27 LAB — CULTURE, BLOOD (ROUTINE X 2): Special Requests: ADEQUATE

## 2019-10-27 LAB — CBC
HCT: 38.4 % — ABNORMAL LOW (ref 39.0–52.0)
Hemoglobin: 12.7 g/dL — ABNORMAL LOW (ref 13.0–17.0)
MCH: 29.7 pg (ref 26.0–34.0)
MCHC: 33.1 g/dL (ref 30.0–36.0)
MCV: 89.7 fL (ref 80.0–100.0)
Platelets: 256 10*3/uL (ref 150–400)
RBC: 4.28 MIL/uL (ref 4.22–5.81)
RDW: 12.6 % (ref 11.5–15.5)
WBC: 16 10*3/uL — ABNORMAL HIGH (ref 4.0–10.5)
nRBC: 0 % (ref 0.0–0.2)

## 2019-10-27 LAB — BASIC METABOLIC PANEL
Anion gap: 9 (ref 5–15)
BUN: 14 mg/dL (ref 6–20)
CO2: 29 mmol/L (ref 22–32)
Calcium: 7.5 mg/dL — ABNORMAL LOW (ref 8.9–10.3)
Chloride: 93 mmol/L — ABNORMAL LOW (ref 98–111)
Creatinine, Ser: 0.65 mg/dL (ref 0.61–1.24)
GFR calc Af Amer: >60 mL/min (ref >60)
GFR calc non Af Amer: >60 mL/min (ref >60)
Glucose, Bld: 160 mg/dL — ABNORMAL HIGH (ref 70–99)
Potassium: 3.4 mmol/L — ABNORMAL LOW (ref 3.5–5.1)
Sodium: 131 mmol/L — ABNORMAL LOW (ref 135–145)

## 2019-10-27 LAB — GLUCOSE, CAPILLARY
Glucose-Capillary: 130 mg/dL — ABNORMAL HIGH (ref 70–99)
Glucose-Capillary: 181 mg/dL — ABNORMAL HIGH (ref 70–99)
Glucose-Capillary: 160 mg/dL — ABNORMAL HIGH (ref 70–99)
Glucose-Capillary: 153 mg/dL — ABNORMAL HIGH (ref 70–99)

## 2019-10-27 LAB — MAGNESIUM: Magnesium: 1.9 mg/dL (ref 1.7–2.4)

## 2019-10-27 MED ORDER — SODIUM CHLORIDE 0.9 % IV SOLN
3.0000 g | Freq: Four times a day (QID) | INTRAVENOUS | Status: DC
  Administered 2019-10-27 – 2019-10-30 (×11): 3 g via INTRAVENOUS
  Filled 2019-10-27 (×23): qty 8
  Filled 2019-10-27: qty 3
  Filled 2019-10-27 (×6): qty 8

## 2019-10-27 MED ORDER — SODIUM CHLORIDE 0.9 % IV SOLN
INTRAVENOUS | Status: AC
  Filled 2019-10-27 (×2): qty 8

## 2019-10-27 MED ORDER — POTASSIUM CHLORIDE CRYS ER 20 MEQ PO TBCR
20.0000 meq | EXTENDED_RELEASE_TABLET | Freq: Once | ORAL | Status: AC
  Administered 2019-10-27: 20 meq via ORAL
  Filled 2019-10-27: qty 1

## 2019-10-27 NOTE — Progress Notes (Addendum)
Progress Note  Patient Name: [name] Date of Encounter: 10/27/2019  Consulting Cardiologist:  Nona DellSamuel McDowell, MD  Subjective   Feels well, glad the surgery is over  Inpatient Medications    Scheduled Meds: . amiodarone  200 mg Oral BID  . apixaban  5 mg Oral BID  . Chlorhexidine Gluconate Cloth  6 each Topical Q0600  . diltiazem  120 mg Oral Daily  . insulin aspart  0-15 Units Subcutaneous TID WC  . insulin aspart  0-5 Units Subcutaneous QHS  . insulin glargine  30 Units Subcutaneous QHS  . sodium chloride flush  3 mL Intravenous Q12H   Continuous Infusions: . 0.9 % NaCl with KCl 20 mEq / L 75 mL/hr at 10/27/19 0535  .  ceFAZolin (ANCEF) IV Stopped (10/27/19 0348)   PRN Meds: acetaminophen **OR** acetaminophen, fentaNYL (SUBLIMAZE) injection, fluticasone, promethazine   Vital Signs    Vitals:   10/27/19 0544 10/27/19 0600 10/27/19 0800 10/27/19 0808  BP:  140/83    Pulse:  100    Resp:  18    Temp: 98.3 F (36.8 C)  98.5 F (36.9 C)   TempSrc: Oral  Oral   SpO2:  96%  98%  Weight:      Height:        Intake/Output Summary (Last 24 hours) at 10/27/2019 0829 Last data filed at 10/27/2019 0535 Gross per 24 hour  Intake 2105.87 ml  Output 2725 ml  Net -619.13 ml   Filed Weights   10/25/19 0410 10/26/19 0630 10/27/19 0500  Weight: 99.3 kg 99 kg 101.2 kg   Last Weight  Most recent update: 10/27/2019  5:47 AM   Weight  101.2 kg (223 lb 1.7 oz)           Weight change: 2.2 kg   Telemetry    A fib, controlled rate - Personally Reviewed  Physical Exam   General: Well developed, well nourished, male in no acute distress Head: Eyes PERRLA, Head normocephalic and atraumatic Lungs: clear bilaterally to auscultation. Heart: Irreg R&R S1 S2, without rub or gallop. Soft murmur. Upper extremity pulses are 2+ & equal. No JVD. Abdomen: Bowel sounds are present, abdomen soft and non-tender without masses or  hernias noted. Msk: Normal strength and  tone for age.  Extremities: No clubbing, cyanosis or edema. S/p L BKA and surgery on R foot, dressing not removed  Skin:  No rashes or lesions noted. Neuro: Alert and oriented X 3. Psych:  Good affect, responds appropriately   Labs    Hematology Recent Labs  Lab 10/25/19 0418 10/26/19 0444 10/27/19 0342  WBC 9.8 11.9* 16.0*  RBC 3.95* 4.22 4.28  HGB 12.0* 12.6* 12.7*  HCT 36.2* 37.5* 38.4*  MCV 91.6 88.9 89.7  MCH 30.4 29.9 29.7  MCHC 33.1 33.6 33.1  RDW 12.5 12.3 12.6  PLT 132* 185 256    Chemistry Recent Labs  Lab 10/22/19 1520 10/23/19 0359 10/24/19 0357 10/25/19 0418 10/26/19 0444 10/27/19 0342  NA 125* 132* 131* 132* 130* 131*  K 3.4* 3.3* 3.8 3.9 3.5 3.4*  CL 89* 98 96* 98 96* 93*  CO2 21* 22 28 28 25 29   GLUCOSE 389* 266* 242* 211* 198* 160*  BUN 40* 31* 25* 20 18 14   CREATININE 1.42* 0.97 0.87 0.69 0.69 0.65  CALCIUM 8.8* 8.5* 8.2* 8.0* 7.8* 7.5*  PROT 7.5 6.0* 5.4*  --   --   --   ALBUMIN 3.4* 2.6* 2.1*  --   --   --  AST --   --   --   ALT --   --   --   ALKPHOS 123 83 69  --   --   --   BILITOT 2.0* 1.6* 1.3*  --   --   --   GFRNONAA 55* >60 >60 >60 >60 >60  GFRAA >60 >60 >60 >60 >60 >60  ANIONGAP Cardiac Studies    ECHO: 10/23/2019  1. Left ventricular ejection fraction, by visual estimation, is 70 to 75%. The left ventricle has hyperdynamic function. There is mildly increased left ventricular hypertrophy.  2. Left ventricular diastolic parameters are indeterminate.  3. The left ventricle has no regional wall motion abnormalities.  4. Global right ventricle has normal systolic function.The right ventricular size is normal. No increase in right ventricular wall thickness.  5. Left atrial size was normal.  6. Right atrial size was normal.  7. The mitral valve is grossly normal. Trivial mitral valve regurgitation.  8. The tricuspid valve is grossly normal. Tricuspid valve regurgitation is trivial.  9. The  aortic valve is tricuspid. Aortic valve regurgitation is not visualized. 10. The pulmonic valve was grossly normal. Pulmonic valve regurgitation is not visualized. 11. Mildly elevated pulmonary artery systolic pressure. 12. The tricuspid regurgitant velocity is 3.04 m/s, and with an assumed right atrial pressure of 3 mmHg, the estimated right ventricular systolic pressure is mildly elevated at 40.0 mmHg. 13. The inferior vena cava is normal in size with greater than 50% respiratory variability, suggesting right atrial pressure of 3 mmHg.  Radiology    MR FOOT RIGHT W WO CONTRAST  Result Date: 10/23/2019 CLINICAL DATA:  Nonhealing ulcer involving the third toe. History of prior amputations. EXAM: MRI OF THE RIGHT FOREFOOT WITHOUT AND WITH CONTRAST TECHNIQUE: Multiplanar, multisequence MR imaging of the right foot was performed before and after the administration of intravenous contrast. CONTRAST:  10mL GADAVIST GADOBUTROL 1 MMOL/ML IV SOLN COMPARISON:  Radiographs 10/22/2019 FINDINGS: Abnormal T1 and T2 signal intensity in the proximal phalanx of the third toe and also in what appears to be a small residual portion of the middle phalanx. There is also associated abnormal contrast enhancement and surrounding cellulitis. Findings consistent with osteomyelitis. No other definite sites of acute osteomyelitis are identified. Evidence of prior amputations involving the first and fifth toes and parts of the other toes. The metatarsal bones are intact. No findings for septic arthritis involving the remaining MTP joints or involving the midfoot joint spaces. There is moderate edema like signal abnormality in the navicular bone along with some enhancement. Could not exclude osteomyelitis. Severe diffuse cellulitis involving the entire midfoot and forefoot. Mild diffuse myositis without findings for pyomyositis. IMPRESSION: 1. Osteomyelitis involving the third digit as detailed above. 2. Abnormal signal intensity and  enhancement in the navicular bone. This is not completely covered but could not exclude osteomyelitis. 3. Severe diffuse cellulitis and mild diffuse myositis. Electronically Signed   By: Rudie Meyer M.D.   On: 10/23/2019 16:50   US Venous Img Lower Unilateral Right (DVT)  Result Date: 10/25/2019 CLINICAL DATA:  Right calf pain and edema. EXAM: RIGHT LOWER EXTREMITY VENOUS DOPPLER ULTRASOUND TECHNIQUE: Gray-scale sonography with graded compression, as well as color Doppler and duplex ultrasound were performed to evaluate the lower extremity deep venous systems from the level of the common femoral vein and including the common femoral, femoral, profunda femoral, popliteal and calf  veins including the posterior tibial, peroneal and gastrocnemius veins when visible. The superficial great saphenous vein was also interrogated. Spectral Doppler was utilized to evaluate flow at rest and with distal augmentation maneuvers in the common femoral, femoral and popliteal veins. COMPARISON:  None. FINDINGS: Contralateral Common Femoral Vein: Respiratory phasicity is normal and symmetric with the symptomatic side. No evidence of thrombus. Normal compressibility. Common Femoral Vein: No evidence of thrombus. Normal compressibility, respiratory phasicity and response to augmentation. Saphenofemoral Junction: No evidence of thrombus. Normal compressibility and flow on color Doppler imaging. Profunda Femoral Vein: No evidence of thrombus. Normal compressibility and flow on color Doppler imaging. Femoral Vein: No evidence of thrombus. Normal compressibility, respiratory phasicity and response to augmentation. Popliteal Vein: Occlusive thrombus present in the right popliteal vein. Calf Veins: Posterior tibial vein normally patent. Visualized peroneal vein demonstrates some nonocclusive thrombus in the upper calf. Superficial Great Saphenous Vein: No evidence of thrombus. Normal compressibility. Venous Reflux:  None. Other  Findings: No evidence of superficial thrombophlebitis or abnormal fluid collection. IMPRESSION: Positive for right lower extremity DVT with occlusive thrombus in the popliteal vein and nonocclusive thrombus in the peroneal vein. Electronically Signed   By: Irish Lack M.D.   On: 10/25/2019 14:21   DG Foot Complete Right  Result Date: 10/24/2019 CLINICAL DATA:  Patient status post amputation of the right third metatarsal head and toe today. EXAM: RIGHT FOOT COMPLETE - 3+ VIEW COMPARISON:  Plain films the right foot 10/22/2019. FINDINGS: The right third toe and head and neck of the third metatarsal have been amputated. Surgical wound with packing noted. The patient is status post prior amputation of the great toe and distal phalanx of the second toe. 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 No acute abnormality. Electronically Signed   By: Drusilla Kanner M.D.   On: 10/24/2019 15:33   ECHOCARDIOGRAM COMPLETE  Result Date: 10/23/2019   ECHOCARDIOGRAM REPORT   Patient Name:   [name] Date of Exam: 10/23/2019 Medical Rec #:  [id number]     Height:       76.0 in Accession #:    [id number]    Weight:       206.8 lb Date of Birth:  1966-08-19    BSA:          2.25 m Patient Age:    53 years      BP:           99/54 mmHg Patient Gender: M             HR:           120 bpm. [facility] Procedure: 2D Echo, Cardiac Doppler and Color Doppler Indications:    Atrial Fibrillation 427.31 / I48.91  History:        Patient has no prior history of Echocardiogram examinations.                 Arrythmias:Atrial Fibrillation; Risk Factors:Diabetes and                 Hypertension.  Sonographer:    Celesta Gentile RCS Referring Phys: [phone] DAVID TAT IMPRESSIONS  1. Left ventricular ejection fraction, by visual estimation, is 70 to 75%. The left ventricle has hyperdynamic function. There is mildly increased left ventricular hypertrophy.  2. Left  ventricular diastolic parameters are indeterminate.  3. The left ventricle has no regional wall motion abnormalities.  4. Global right  ventricle has normal systolic function.The right ventricular size is normal. No increase in right ventricular wall thickness.  5. Left atrial size was normal.  6. Right atrial size was normal.  7. The mitral valve is grossly normal. Trivial mitral valve regurgitation.  8. The tricuspid valve is grossly normal. Tricuspid valve regurgitation is trivial.  9. The aortic valve is tricuspid. Aortic valve regurgitation is not visualized. 10. The pulmonic valve was grossly normal. Pulmonic valve regurgitation is not visualized. 11. Mildly elevated pulmonary artery systolic pressure. 12. The tricuspid regurgitant velocity is 3.04 m/s, and with an assumed right atrial pressure of 3 mmHg, the estimated right ventricular systolic pressure is mildly elevated at 40.0 mmHg. 13. The inferior vena cava is normal in size with greater than 50% respiratory variability, suggesting right atrial pressure of 3 mmHg. FINDINGS  Left Ventricle: Left ventricular ejection fraction, by visual estimation, is 70 to 75%. The left ventricle has hyperdynamic function. The left ventricle has no regional wall motion abnormalities. The left ventricular internal cavity size was the left ventricle is normal in size. There is mildly increased left ventricular hypertrophy. Septal left ventricular hypertrophy. Left ventricular diastolic parameters are indeterminate. Right Ventricle: The right ventricular size is normal. No increase in right ventricular wall thickness. Global RV systolic function is has normal systolic function. The tricuspid regurgitant velocity is 3.04 m/s, and with an assumed right atrial pressure  of 3 mmHg, the estimated right ventricular systolic pressure is mildly elevated at 40.0 mmHg. Left Atrium: Left atrial size was normal in size. Right Atrium: Right atrial size was normal in size Pericardium:  There is no evidence of pericardial effusion. Mitral Valve: The mitral valve is grossly normal. Trivial mitral valve regurgitation. Tricuspid Valve: The tricuspid valve is grossly normal. Tricuspid valve regurgitation is trivial. Aortic Valve: The aortic valve is tricuspid. Aortic valve regurgitation is not visualized. Pulmonic Valve: The pulmonic valve was grossly normal. Pulmonic valve regurgitation is not visualized. Pulmonic regurgitation is not visualized. Aorta: The aortic root is normal in size and structure. Venous: The inferior vena cava is normal in size with greater than 50% respiratory variability, suggesting right atrial pressure of 3 mmHg. IAS/Shunts: No atrial level shunt detected by color flow Doppler.  LEFT VENTRICLE PLAX 2D LVIDd:         4.44 cm LVIDs:         2.56 cm LV PW:         1.00 cm LV IVS:        1.26 cm LVOT diam:     2.00 cm LV SV:         66 ml LV SV Index:   29.33 LVOT Area:     3.14 cm  RIGHT VENTRICLE TAPSE (M-mode): 1.7 cm LEFT ATRIUM             Index       RIGHT ATRIUM           Index LA diam:        3.30 cm 1.47 cm/m  RA Area:     20.60 cm LA Vol (A2C):   52.0 ml 23.14 ml/m RA Volume:   57.20 ml  25.46 ml/m LA Vol (A4C):   40.9 ml 18.20 ml/m LA Biplane Vol: 47.2 ml 21.01 ml/m  AORTIC VALVE LVOT Vmax:   129.00 cm/s LVOT Vmean:  82.933 cm/s LVOT VTI:    0.185 m  AORTA Ao Root diam: 3.30 cm MITRAL VALVE  TRICUSPID VALVE MV Area (PHT): 3.21 cm             TR Peak grad:   37.0 mmHg MV PHT:        68.44 msec           TR Vmax:        304.00 cm/s MV Decel Time: 236 msec MV E velocity: 107.00 cm/s 103 cm/s SHUNTS                                     Systemic VTI:  0.19 m                                     Systemic Diam: 2.00 cm  Rozann Lesches MD Electronically signed by Rozann Lesches MD Signature Date/Time: 10/23/2019/9:39:01 AM    Final     Patient Profile     53 y.o. male w/ hx poorly controlled DM, HTN, periph neuropathy, diabetic foot ulcers  s/p L BKA, Charcot foot deformity, who was admitted 12/16 with LE cellulitis, sepsis and rapid Afib. Cards saw for atrial fib and to eval preop surgical risk   Assessment & Plan    1. Atrial fib, RVR - new dx, atrial sizes normal, HR 180s on admit, pt unaware - was on amio 30 mg/hr and Dilt 10 mg/hr preop - now on amio 200 mg bid, Dilt CD 120 mg qd - on Eliquis 5 mg bid w/ CHA2DS2-VASc = 2 (DM, HTN) - consider TEE/DCCV   2. Sepsis - hx Charcot R foot, developed MSSA cellulitis w/ osteomyelitis or R 3rd toe w/ bacteremia - s/p partial 3rd ray amputation 12/18 - ID has seen, managing ABX - ID recommends TEE this week to help determine duration of therapy  3. AKI - BUN/Cr 40/1.42 on admit, s/p hydration and now normalized - per IM  4. DVT - pt developed R calf pain>>US showed DVT - continue Eliquis, duration per MD - Medication cost is an issue for him, will need SW consult prior to d/c.   Signed, Rosaria Ferries , PA-C 8:29 AM 10/27/2019 Pager: [phone]  Patient seen and examined   I agree with findings as noted above by R Barrett Pt is comfortable   No SOB or CP Lungs are CTA Cardiac Irreg irreg   No S3 or murmurs Ext   R Foot in Boot  L  S/p L BKA   No edema Tele:  Afib  Rates controlled  Will work on sched TEE to eval valves and for thrombus   If no thrombus then plan for cardioverstion Risk/benefits explained  Pt understands and agrees to proceed. Keep on same meds for now   [name] MD

## 2019-10-27 NOTE — Progress Notes (Addendum)
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  Feeling well.  Denies any nausea, vomiting, shortness of breath or chest pain.  Denies any current pain along posterior R leg.  Objective Vital signs in last 24 hours:   Temp:  [97.9 F (36.6 C)-99.1 F (37.3 C)] 98.6 F (37 C) (12/21 1140) Pulse Rate:  [82-107] 91 (12/21 1600) Resp:  [16-32] 28 (12/21 1600) BP: (121-152)/(71-102) 145/87 (12/21 1600) SpO2:  [91 %-100 %] 95 % (12/21 1600) Weight:  [101.2 kg] 101.2 kg (12/21 0500)  DRESSING(S): Intact without strikethrough.  Heel protective boot in place. INTEGUMENT: Skin edges at amputation site remain viable. Wound bed viable with no necrosis present.  Further improvement in erythema of foot and ankle.  No skin changes along the posterior of the right heel.  Less streaking of R leg. VASCULAR: Pedal pulses are palpable. Edema of the RLE.  Improved from yesterday.   MUSCULOSKELETAL: R calf is soft and nontender.Charcot deformity of R foot.  This is not a new finding for this patient.  R ankle is TTP medially and laterally.    Lab/Test Results  Recent Labs    10/26/19 0444 10/27/19 0342  WBC 11.9* 16.0*  HGB 12.6* 12.7*  HCT 37.5* 38.4*  PLT 185 256  NA 130* 131*  K 3.5 3.4*  CL 96* 93*  CO2 25 29  BUN 18 14  CREATININE 0.69 0.65  GLUCOSE 198* 160*  CALCIUM 7.8* 7.5*    Recent Results (from the past 240 hour(s))  Urine culture     Status: Abnormal   Collection Time: 10/22/19  3:08 PM   Specimen: In/Out Cath Urine  Result Value Ref Range Status   Specimen Description   Final    IN/OUT CATH URINE Performed at [facility], [address], [place], [place] [address]    Special Requests   Final    NONE Performed at [facility], [address], [place], [place] [address]    Culture (A)  Final    <10,000 COLONIES/mL INSIGNIFICANT GROWTH Performed at [facility], [place] [address] [place], [place] [address]    Report Status 10/23/2019  FINAL  Final  Blood Culture (routine x 2)     Status: Abnormal   Collection Time: 10/22/19  3:20 PM   Specimen: BLOOD LEFT WRIST  Result Value Ref Range Status   Specimen Description   Final    BLOOD LEFT WRIST Performed at [facility], [address], [place], [place] [address]    Special Requests   Final    BOTTLES DRAWN AEROBIC AND ANAEROBIC Blood Culture adequate volume Performed at [facility], [address], [place], [place] [address]    Culture  Setup Time   Final    GRAM POSITIVE COCCI BOTH ANAEROBIC AND AEROBIC BOTTLES Gram Stain Report Called to,Read Back By and Verified With: HEARN,[email or web address]  BY MATTHEWS, B 12.17.2020 [facility] Performed at [facility], [address], [place], [place] [address]    Culture STAPHYLOCOCCUS AUREUS (A)  Final   Report Status 10/26/2019 FINAL  Final   Organism ID, Bacteria STAPHYLOCOCCUS AUREUS  Final      Susceptibility   Staphylococcus aureus - MIC*    CIPROFLOXACIN >=8 RESISTANT Resistant     ERYTHROMYCIN >=8 RESISTANT Resistant     GENTAMICIN <=0.5 SENSITIVE Sensitive     OXACILLIN 0.5 SENSITIVE Sensitive     TETRACYCLINE >=16 RESISTANT Resistant     VANCOMYCIN 1 SENSITIVE Sensitive  TRIMETH/SULFA <=10 SENSITIVE Sensitive     CLINDAMYCIN RESISTANT Resistant     RIFAMPIN <=0.5 SENSITIVE Sensitive     Inducible Clindamycin POSITIVE Resistant     * STAPHYLOCOCCUS AUREUS  Blood Culture (routine x 2)     Status: Abnormal   Collection Time: 10/22/19  3:31 PM   Specimen: Right Antecubital; Blood  Result Value Ref Range Status   Specimen Description   Final    RIGHT ANTECUBITAL Performed at [facility], [address] [place], [place] [address]    Special Requests   Final    BOTTLES DRAWN AEROBIC AND ANAEROBIC Blood Culture adequate volume Performed at [facility], [address] [place], [place] [address]    Culture  Setup Time   Final    GRAM POSITIVE COCCI ANAEROBIC AND AEROBIC BOTTLES Gram Stain Report Called to,Read  Back By and Verified With: HEARN,J@0705  BY MATTHEWS B 12.17.2020 [facility] Performed at [facility], [address] [place], [place] [address]    Culture (A)  Final    STAPHYLOCOCCUS AUREUS SUSCEPTIBILITIES PERFORMED ON PREVIOUS CULTURE WITHIN THE LAST 5 DAYS. Performed at [facility], [address] [place], [place] [address]    Report Status 10/26/2019 FINAL  Final  SARS CORONAVIRUS 2 (TAT 6-24 HRS) Nasopharyngeal Nasopharyngeal Swab     Status: None   Collection Time: 10/22/19  4:14 PM   Specimen: Nasopharyngeal Swab  Result Value Ref Range Status   SARS Coronavirus 2 NEGATIVE NEGATIVE Final    Comment: (NOTE) SARS-CoV-2 target nucleic acids are NOT DETECTED. The SARS-CoV-2 RNA is generally detectable in upper and lower respiratory specimens during the acute phase of infection. Negative results do not preclude SARS-CoV-2 infection, do not rule out co-infections with other pathogens, and should not be used as the sole basis for treatment or other patient management decisions. Negative results must be combined with clinical observations, patient history, and epidemiological information. The expected result is Negative. Fact Sheet for Patients: HairSlick.nohttps://www.fda.gov/media/138098/download Fact Sheet for Healthcare Providers: quierodirigir.comhttps://www.fda.gov/media/138095/download This test is not yet approved or cleared by the [place] FDA and  has been authorized for detection and/or diagnosis of SARS-CoV-2 by FDA under an Emergency Use Authorization (EUA). This EUA will remain  in effect (meaning this test can be used) for the duration of the COVID-19 declaration under Section 56 4(b)(1) of the Act, 21 U.S.C. section 360bbb-3(b)(1), unless the authorization is terminated or revoked sooner. Performed at [facility], [address] [place], [place] [address]   MRSA PCR Screening     Status: None   Collection Time: 10/22/19  8:11 PM   Specimen: Nasal Mucosa;  Nasopharyngeal  Result Value Ref Range Status   MRSA by PCR NEGATIVE NEGATIVE Final    Comment:        The GeneXpert MRSA Assay (FDA approved for NASAL specimens only), is one component of a comprehensive MRSA colonization surveillance program. It is not intended to diagnose MRSA infection nor to guide or monitor treatment for MRSA infections. Performed at [facility], [address] [place], [place] [address]   C difficile quick scan w PCR reflex     Status: None   Collection Time: 10/22/19 11:56 PM   Specimen: STOOL  Result Value Ref Range Status   C Diff antigen NEGATIVE NEGATIVE Final   C Diff toxin NEGATIVE NEGATIVE Final   C Diff interpretation No C. difficile detected.  Final    Comment: Performed at [facility], [address] [place], [place] [address]  Culture, blood (routine x 2)     Status: None (Preliminary result)   Collection Time: 10/23/19  8:44 AM   Specimen: BLOOD RIGHT HAND  Result Value Ref Range Status   Specimen Description BLOOD RIGHT HAND  Final   Special Requests   Final    BOTTLES DRAWN AEROBIC AND ANAEROBIC Blood Culture adequate volume   Culture   Final    NO GROWTH 3 DAYS Performed at [facility], [address] [place], [place] [address]    Report Status PENDING  Incomplete  Culture, blood (routine x 2)     Status: None (Preliminary result)   Collection Time: 10/23/19  8:46 AM   Specimen: BLOOD RIGHT HAND  Result Value Ref Range Status   Specimen Description BLOOD RIGHT HAND  Final   Special Requests   Final    BOTTLES DRAWN AEROBIC AND ANAEROBIC Blood Culture results may not be optimal due to an excessive volume of blood received in culture bottles   Culture   Final    NO GROWTH 3 DAYS Performed at [facility], [address], [place], [place] [address]    Report Status PENDING  Incomplete  Aerobic/Anaerobic Culture (surgical/deep wound)     Status: None (Preliminary result)   Collection Time: 10/24/19  2:20 PM   Specimen: Soft  Tissue, Other  Result Value Ref Range Status   Specimen Description   Final    TOE SOFT TISSUE AND BONE OF 3RD Performed at [facility], [address] [place], [place] [address]    Special Requests   Final    NONE Performed at [facility], [address], [place], [place] [address]    Gram Stain NO WBC SEEN FEW GRAM POSITIVE COCCI   Final   Culture   Final    MODERATE STAPHYLOCOCCUS AUREUS FEW STREPTOCOCCUS ANGINOSIS SUSCEPTIBILITIES TO FOLLOW HOLDING FOR POSSIBLE ANAEROBE Performed at [facility], [address] [place], [place] [address]    Report Status PENDING  Incomplete   Organism ID, Bacteria STAPHYLOCOCCUS AUREUS  Final      Susceptibility   Staphylococcus aureus - MIC*    CIPROFLOXACIN >=8 RESISTANT Resistant     ERYTHROMYCIN >=8 RESISTANT Resistant     GENTAMICIN <=0.5 SENSITIVE Sensitive     OXACILLIN 0.5 SENSITIVE Sensitive     TETRACYCLINE >=16 RESISTANT Resistant     VANCOMYCIN <=0.5 SENSITIVE Sensitive     TRIMETH/SULFA <=10 SENSITIVE Sensitive     CLINDAMYCIN RESISTANT Resistant     RIFAMPIN <=0.5 SENSITIVE Sensitive     Inducible Clindamycin POSITIVE Resistant     * MODERATE STAPHYLOCOCCUS AUREUS  Culture, blood (routine x 2)     Status: Abnormal   Collection Time: 10/24/19  6:33 PM   Specimen: BLOOD RIGHT ARM  Result Value Ref Range Status   Specimen Description   Final    BLOOD RIGHT ARM Performed at [facility], [address], [place], [place] [address]    Special Requests   Final    BOTTLES DRAWN AEROBIC AND ANAEROBIC Blood Culture adequate volume Performed at [facility], [address] [place], [place] [address]    Culture  Setup Time   Final    GRAM POSITIVE COCCI ANAEROBIC BOTTLE ONLY Gram Stain Report Called to,Read Back By and Verified With: HYLTON 1238 ON [id number] BY HENDERSON L. CRITICAL RESULT CALLED TO, READ BACK BY AND VERIFIED WITH: E. MURPHY, RN (APH) AT 1820 ON 10/25/19 BY  C. JESSUP, MT.    Culture (A)  Final     STAPHYLOCOCCUS AUREUS SUSCEPTIBILITIES PERFORMED ON PREVIOUS CULTURE WITHIN THE LAST 5 DAYS. Performed at [facility], [address], [place], [place] [address]    Report Status 10/27/2019 FINAL  Final  Blood Culture ID Panel (Reflexed)     Status: Abnormal   Collection Time: 10/24/19  6:33 PM  Result Value Ref Range Status   Enterococcus species NOT DETECTED NOT DETECTED Final   Listeria monocytogenes NOT DETECTED NOT DETECTED Final   Staphylococcus species DETECTED (A) NOT DETECTED Final    Comment: CRITICAL RESULT CALLED TO, READ BACK BY AND VERIFIED WITH: E. MURPHY, RN (APH) AT 1820 ON 10/25/19 BY C. JESSUP, MT.    Staphylococcus aureus (BCID) DETECTED (A) NOT DETECTED Final    Comment: Methicillin (oxacillin) susceptible Staphylococcus aureus (MSSA). Preferred therapy is anti staphylococcal beta lactam antibiotic (Cefazolin or Nafcillin), unless clinically contraindicated. CRITICAL RESULT CALLED TO, READ BACK BY AND VERIFIED WITH: E. MURPHY, RN (APH) AT 1820 ON 10/25/19 BY C. JESSUP, MT.    Methicillin resistance NOT DETECTED NOT DETECTED Final   Streptococcus species NOT DETECTED NOT DETECTED Final   Streptococcus agalactiae NOT DETECTED NOT DETECTED Final   Streptococcus pneumoniae NOT DETECTED NOT DETECTED Final   Streptococcus pyogenes NOT DETECTED NOT DETECTED Final   Acinetobacter baumannii NOT DETECTED NOT DETECTED Final   Enterobacteriaceae species NOT DETECTED NOT DETECTED Final   Enterobacter cloacae complex NOT DETECTED NOT DETECTED Final   Escherichia coli NOT DETECTED NOT DETECTED Final   Klebsiella oxytoca NOT DETECTED NOT DETECTED Final   Klebsiella pneumoniae NOT DETECTED NOT DETECTED Final   Proteus species NOT DETECTED NOT DETECTED Final   Serratia marcescens NOT DETECTED NOT DETECTED Final   Haemophilus influenzae NOT DETECTED NOT DETECTED Final   Neisseria meningitidis NOT DETECTED NOT DETECTED Final   Pseudomonas aeruginosa NOT DETECTED NOT  DETECTED Final   Candida albicans NOT DETECTED NOT DETECTED Final   Candida glabrata NOT DETECTED NOT DETECTED Final   Candida krusei NOT DETECTED NOT DETECTED Final   Candida parapsilosis NOT DETECTED NOT DETECTED Final   Candida tropicalis NOT DETECTED NOT DETECTED Final    Comment: Performed at [facility], [address], [place], [place] [address]  Culture, blood (routine x 2)     Status: Abnormal   Collection Time: 10/24/19  6:38 PM   Specimen: BLOOD RIGHT HAND  Result Value Ref Range Status   Specimen Description   Final    BLOOD RIGHT HAND Performed at [facility], [address] [place], [place] [address]    Special Requests   Final    BOTTLES DRAWN AEROBIC AND ANAEROBIC Blood Culture adequate volume Performed at [facility], [address], [place], [place] [address]    Culture  Setup Time   Final    GRAM POSITIVE COCCI ANAEROBIC BOTTLE ONLY Gram Stain Report Called to,Read Back By and Verified With: HYLTON @ 1238 ON [id number] BY HENDERSON L. CRITICAL VALUE NOTED.  VALUE IS CONSISTENT WITH PREVIOUSLY REPORTED AND CALLED VALUE.    Culture (A)  Final    STAPHYLOCOCCUS AUREUS SUSCEPTIBILITIES PERFORMED ON PREVIOUS CULTURE WITHIN THE LAST 5 DAYS. Performed at [facility], [address], [place], [place] [address]    Report Status 10/27/2019 FINAL  Final  Culture, blood (routine x 2)     Status: None (Preliminary result)   Collection Time: 10/25/19  2:38 PM   Specimen: Right  Antecubital; Blood  Result Value Ref Range Status   Specimen Description   Final    RIGHT ANTECUBITAL BOTTLES DRAWN AEROBIC AND ANAEROBIC   Special Requests Blood Culture adequate volume  Final   Culture   Final    NO GROWTH < 24 HOURS Performed at [facility], [address] [place], [place] [address]    Report Status PENDING  Incomplete  Culture, blood (routine x 2)     Status: None (Preliminary result)   Collection Time: 10/26/19 11:17 AM   Specimen: BLOOD RIGHT ARM  Result  Value Ref Range Status   Specimen Description BLOOD RIGHT ARM  Final   Special Requests   Final    BOTTLES DRAWN AEROBIC AND ANAEROBIC Blood Culture adequate volume Performed at [facility], [address], [place], [place] [address]    Culture PENDING  Incomplete   Report Status PENDING  Incomplete  Culture, blood (routine x 2)     Status: None (Preliminary result)   Collection Time: 10/26/19 11:17 AM   Specimen: BLOOD LEFT HAND  Result Value Ref Range Status   Specimen Description BLOOD LEFT HAND  Final   Special Requests   Final    BOTTLES DRAWN AEROBIC ONLY Blood Culture adequate volume Performed at [facility], [address], [place], [place] [address]    Culture PENDING  Incomplete   Report Status PENDING  Incomplete     No results found.  Medications Scheduled Meds: . amiodarone  200 mg Oral BID  . apixaban  5 mg Oral BID  . Chlorhexidine Gluconate Cloth  6 each Topical Q0600  . diltiazem  120 mg Oral Daily  . insulin aspart  0-15 Units Subcutaneous TID WC  . insulin aspart  0-5 Units Subcutaneous QHS  . insulin glargine  30 Units Subcutaneous QHS  . sodium chloride flush  3 mL Intravenous Q12H   Continuous Infusions: .  ceFAZolin (ANCEF) IV Stopped (10/27/19 1224)   PRN Meds:.acetaminophen **OR** acetaminophen, fentaNYL (SUBLIMAZE) injection, fluticasone, promethazine  Assessment 1.  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 2.  DVT, RLE  Plan Changed packing of R foot. Applied dressing to R foot. Continue Prevalon boot to RLE. Continue NWB RLE. Will monitor R ankle pain and obtain further imaging if tenderness persists.   Continue antibiotics per ID. DVT treatment per IM.  Marius Ditch 10/27/2019, 5:22 PM

## 2019-10-27 NOTE — Progress Notes (Addendum)
Pharmacy Antibiotic Note  [name] is a 53 y.o. male admitted on 10/22/2019 with MSSA bacteremia and wound infection of right third toe (now amputated).  Pharmacy has been consulted for ampicillin/sulbactam dosing. Patient has recently been on cefazolin for MSSA bacteremia, with blood cultures now growing MSSA as well as possible anaerobe, so therapy is to be changed to cover possible anaerobic pathogen.   Creatinine has been stable, today at 0.65, with an estimated CrCl of around 128 ml/min. WBC count today is slightly elevated at 16.0, and his Tmax is 98.3, but he has been receiving acetaminophen. Last dose of cefazolin was today at 1154.   Plan: stop cefazolin  Start Unasyn at 3 grams IV every 6 hours starting 8 hours from last dose of cefazolin (2000) Monitor cultures, length of therapy, IE work up, clinical status, and renal function.   Height: 6\' 4"  (193 cm) Weight: 223 lb 1.7 oz (101.2 kg) IBW/kg (Calculated) : 86.8  Temp (24hrs), Avg:98.5 F (36.9 C), Min:97.9 F (36.6 C), Max:99.1 F (37.3 C)  Recent Labs  Lab 10/22/19 1520 10/22/19 1706 10/23/19 0359 10/24/19 0357 10/24/19 1832 10/25/19 0418 10/25/19 1438 10/26/19 0444 10/27/19 0342  WBC 12.1*  --  7.7 8.0  --  9.8  --  11.9* 16.0*  CREATININE 1.42*  --  0.97 0.87  --  0.69  --  0.69 0.65  LATICACIDVEN 3.8* 2.3* 2.2*  --  2.2*  --  1.7  --   --   VANCOTROUGH  --   --   --   --   --  11*  --   --   --     Estimated Creatinine Clearance: 128.1 mL/min (by C-G formula based on SCr of 0.65 mg/dL).    No Known Allergies  Antimicrobials this admission: Zosyn 12/16 >> 12/17 Vanco 12/17 >> 12/20 Cefepime 12/18 >>12/20 Ancef 12/20 >> 12/21 Unasyn12/21 >>   Microbiology results: 12/17 BCx: ngtd 12/18 BCx: MSSA with possible anaerobe  12/19 BCx: ngtd 12/20 BCx: pending  12/16 UCx: insignificant growth    12/16 MRSA PCR: negative 12/16 C Diff panel: Negative    Thank you for allowing pharmacy to be a part  of this patient's care.    Eddie Candle, PharmD PGY-1 Pharmacy Resident   Please check amion for clinical pharmacist contact number    10/27/2019 5:39 PM

## 2019-10-27 NOTE — Addendum Note (Signed)
Addendum  created 10/27/19 8016 by Vista Deck, CRNA   Charge Capture section accepted

## 2019-10-27 NOTE — Care Management Important Message (Signed)
Important Message  Patient Details  Name: [name] MRN: [id number] Date of Birth: [birth year]   Medicare Important Message Given:  Yes(RN will deliver to patient)     Tommy Medal 10/27/2019, 4:02 PM

## 2019-10-27 NOTE — Progress Notes (Signed)
Patient ID: [name], male   DOB: 1966/09/05, 53 y.o.   MRN: [id number]  Patient ID: [name], male   DOB: [birth year], 53 y.o.   MRN: [id number]          [facility]    Date of Admission:  10/22/2019   Day 6 antibiotics        Day 2 cefazolin  He was admitted with infection of his right third toe which is now been amputated.  His infection is complicated by MSSA bacteremia.  Repeat blood cultures on 10/23/2019 remain negative but repeat blood cultures on 10/24/2019 shortly after surgery are positive.  Repeat blood cultures on 10/25/2019 and yesterday remain negative.  His operative culture of his third toe has grown MSSA and a possible anaerobe.  I will change cefazolin ampicillin sulbactam to cover both pending final cultures.  He will be scheduled for TEE to help determine optimal duration of his IV antibiotic therapy.         Michel Bickers, MD [facility] [phone] pager   [phone] cell 10/27/2019, 5:24 PM

## 2019-10-27 NOTE — Progress Notes (Signed)
PROGRESS NOTE  [name] XHB:[id number] DOB: 10/11/1966 DOA: 10/22/2019 PCP: Dettinger, [name], MD  Brief History: 53 year old male with a history of diabetes mellitus type 2, diabetic polyneuropathy, hypertension, left BKA, and right toe amputations secondary to diabetic foot infections presenting with 1 day history of increasing erythema, edema, and pain on his right foot. The patient stated that his podiatrist, Dr. [name] started him on 10 days of doxycycline on 10/10/2019 for his diabetic foot ulcer on his right fourth toe on 10/10/2019.The patient is a bit vague regarding the history of his most recent foot ulcer on his right fourth toe, but stated that he has noted some intermittent drainage in the past 1 to 2 days prior to admission. He had denied any fevers, chills, headache, neck pain, chest pain, shortness breath, cough, hemoptysis, abdominal pain, dysuria, hematuria. The patient did have episodes of nausea and vomiting for the past 3 days. He is also noted some loose stools without any hematochezia or melena. Because of his nausea, vomiting, and right foot erythema and pain he presented for further evaluation. In the ED, the patient had temperature 100.6 F. He was noted to have atrial fibrillation with RVR with heart rates into the 180s. The patient was hemodynamically stable with oxygen saturation 96-97% on room air. WBC was 12.1 with hemoglobin 16.0 and platelets 176K.Sodium was 125 with serum creatinine 1.42. Lactic acid peaked at 3.8. The patient was fluid resuscitated and started on vancomycin and Zosyn. Blood cultures from the emergency department were also obtained.  Assessment/Plan: Sepsis with organ dysfunction -Secondary to diabetic foot infection and bacteremia -Lactic acid peaked 3.8 -check PCT10.37>>>7.96>>>6.70 -Continue vancomycin>>>cefazolin>>>amp/sulbactam -Discontinue Zosyn -Continue IV fluids>>saline  lock -SARS-CoV2--neg -10/24/19 spike fever 3 hours post-op  Diabetic foot infection/cellulitis right foot/Osteomyelitis toe -Podiatry, Dr. [name] has been consulted -MRI foot--osteomyelitis of R-third toe with diffuse cellulitis and myositis -CRP 40.7 -YBO17 -10/24/19--Right third ray amputation -10/24/19--bone culture--OSSA and S. anginosus + possible anaerobe  Bacteremia--Staph aureus-OSSA -Source is likely foot infection -12/17 blood culture--OSSA -12/18blood cultures--OSSA -12/18 bone culture--OSSA and S. anginosus + possible anaerobe -12/19 blood culture--neg -12/20 blood culture--neg -Discontinue vanco -Discontinue Zosyn -cefazolin>>>unasyn--plan 2 weeks beyond last negative blood culture -planning TEE  12/22 or 12/23  Right popliteal and peroneal DVT -10/25/19 venous duplex +DVT -already on heparin IV>>>apixaban  DKA, type 2 -pt presented with serum glucose 389 with anion gap 15 and ketonuria -resolved with aggressive fluid resuscitation -transition to  insulin  AKI -Resulting from sepsis -Baseline creatinine 0.7-1.0 -Serum creatinine peaked 1.42 -A.m. BMP  Atrial fibrillation with RVR, type unspecified -CHADS-VASc = 3 (HTN, DM2, PAD) -ContinueIV heparin in anticipation for possible surgery on foot -Echo--EF 70-75%, no WMA, trace MR/TR, mild increase PASP -TSH--0.929 -switch to po amiodarone 12/20 -switch to po dilitazem 12/20 -appreciate cardiology followup  Uncontrolled diabetes mellitus type 2 with hyperglycemia -Anticipate elevated CBGs secondary to sepsis -07/22/2019 hemoglobin A1c 12.4 -Increase Lantus to 30 units hs -NovoLog sliding scale -Holding metformin -12/17/20A1C--9.8  Hypokalemia/hypomagnesemia -Replete -Added potassium to IV fluids  Hyponatremia -Multifactorial including hyperglycemia, volume depletion, AKI -switch to IV NS today  Intractable nausea and vomiting -Suspect a component of gastritis/diabetic  gastroparesis incited by sepsisand DKA -improved -advanced to carb modified diet  Diarrhea -C. difficile negative -If persistent, check stool pathogen panel  Essential hypertension -Blood pressure currently controlled on diltiazem drip -Holding lisinopril secondary to AKI initially and to allow BP margin for diltiazem titration  Hyperlipidemia -Continue statinwhen  able to tolerate po  Left arm pain -venous duplex        Disposition Plan:Remain in ICU Family Communication:NoFamily at bedside  Consultants:Cardiology, podiatry  Code Status: FULL  DVT Prophylaxis:IVHeparin    Procedures: As Listed in Progress Note Above  Antibiotics: Unasyn 12/21>>> Cefazolin 12/20>>>12/21 vanco 12/16>>>12/20 Cefepime 12/18>>>12/20 Zosyn 12/16>>>12/17     Subjective: Patient denies fevers, chills, headache, chest pain, dyspnea, nausea, vomiting, diarrhea, abdominal pain, dysuria, hematuria, hematochezia, and melena.   Objective: Vitals:   10/27/19 1200 10/27/19 1300 10/27/19 1500 10/27/19 1600  BP: (!) 144/86 (!) 135/92 130/89 (!) 145/87  Pulse: 93 100 90 91  Resp: (!) 23 (!) 25 (!) 26 (!) 28  Temp:      TempSrc:      SpO2: 96% 96% 98% 95%  Weight:      Height:        Intake/Output Summary (Last 24 hours) at 10/27/2019 1753 Last data filed at 10/27/2019 1625 Gross per 24 hour  Intake 2475.47 ml  Output 2150 ml  Net 325.47 ml   Weight change: 2.2 kg Exam:   General:  Pt is alert, follows commands appropriately, not in acute distress  HEENT: No icterus, No thrush, No neck mass, [place]/AT  Cardiovascular: RRR, S1/S2, no rubs, no gallops  Respiratory: CTA bilaterally, no wheezing, no crackles, no rhonchi  Abdomen: Soft/+BS, non tender, non distended, no guarding  Extremities: No edema, No lymphangitis, No petechiae, No rashes, no synovitis   Data Reviewed: I have personally reviewed following labs and imaging studies Basic  Metabolic Panel: Recent Labs  Lab 10/23/19 0359 10/24/19 0357 10/25/19 0418 10/26/19 0444 10/27/19 0342  NA 132* 131* 132* 130* 131*  K 3.3* 3.8 3.9 3.5 3.4*  CL 98 96* 98 96* 93*  CO2 _0 GLUCOSE 266* 242* 211* 198* 160*  BUN 31* 25* _1 CREATININE 0.97 0.87 0.69 0.69 0.65  CALCIUM 8.5* 8.2* 8.0* 7.8* 7.5*  MG 1.7 1.8 1.8 1.6* 1.9   Liver Function Tests: Recent Labs  Lab 10/22/19 1520 10/23/19 0359 10/24/19 0357  AST _2 ALT _3 ALKPHOS 123 83 69  BILITOT 2.0* 1.6* 1.3*  PROT 7.5 6.0* 5.4*  ALBUMIN 3.4* 2.6* 2.1*   No results for input(s): LIPASE, AMYLASE in the last 168 hours. No results for input(s): AMMONIA in the last 168 hours. Coagulation Profile: Recent Labs  Lab 10/22/19 1520  INR 1.3*   CBC: Recent Labs  Lab 10/22/19 1520 10/23/19 0359 10/24/19 0357 10/25/19 0418 10/26/19 0444 10/27/19 0342  WBC 12.1* 7.7 8.0 9.8 11.9* 16.0*  NEUTROABS 10.2* 6.3  --   --   --   --   HGB 16.0 13.8 12.4* 12.0* 12.6* 12.7*  HCT 46.1 39.0 36.0* 36.2* 37.5* 38.4*  MCV 88.0 87.8 87.8 91.6 88.9 89.7  PLT 176 123* 126* 132* 185 256   Cardiac Enzymes: No results for input(s): CKTOTAL, CKMB, CKMBINDEX, TROPONINI in the last 168 hours. BNP: Invalid input(s): POCBNP CBG: Recent Labs  Lab 10/26/19 1633 10/26/19 2138 10/27/19 0802 10/27/19 1142 10/27/19 1645  GLUCAP 155* 130* 130* 181* 160*   HbA1C: No results for input(s): HGBA1C in the last 72 hours. Urine analysis:    Component Value Date/Time   COLORURINE YELLOW 10/22/2019 1508   APPEARANCEUR HAZY (A) 10/22/2019 1508   LABSPEC 1.017 10/22/2019 1508   PHURINE 5.0 10/22/2019 1508   GLUCOSEU >=500 (A) 10/22/2019 1508   HGBUR SMALL (  A) 10/22/2019 1508   BILIRUBINUR NEGATIVE 10/22/2019 1508   BILIRUBINUR negative 03/26/2014 1212   KETONESUR 5 (A) 10/22/2019 1508   PROTEINUR NEGATIVE 10/22/2019 1508   UROBILINOGEN 0.2 06/13/2014 1048   NITRITE NEGATIVE 10/22/2019 1508    LEUKOCYTESUR NEGATIVE 10/22/2019 1508   Sepsis Labs: _0 (procalcitonin:4,lacticidven:4) ) Recent Results (from the past 240 hour(s))  Urine culture     Status: Abnormal   Collection Time: 10/22/19  3:08 PM   Specimen: In/Out Cath Urine  Result Value Ref Range Status   Specimen Description   Final    IN/OUT CATH URINE Performed at [facility], [address] [place], [place] [address]    Special Requests   Final    NONE Performed at [facility], [address], [place], [place] [address]    Culture (A)  Final    <10,000 COLONIES/mL INSIGNIFICANT GROWTH Performed at [facility], [place] [address], [place], [place] [address]    Report Status 10/23/2019 FINAL  Final  Blood Culture (routine x 2)     Status: Abnormal   Collection Time: 10/22/19  3:20 PM   Specimen: BLOOD LEFT WRIST  Result Value Ref Range Status   Specimen Description   Final    BLOOD LEFT WRIST Performed at [facility], [address], [place], [place] [address]    Special Requests   Final    BOTTLES DRAWN AEROBIC AND ANAEROBIC Blood Culture adequate volume Performed at [facility], [address] [place], [place] [address]    Culture  Setup Time   Final    GRAM POSITIVE COCCI BOTH ANAEROBIC AND AEROBIC BOTTLES Gram Stain Report Called to,Read Back By and Verified With: HEARN,J_1  BY MATTHEWS, B 12.17.2020 [facility] Performed at [facility], [address], [place], [place] [address]    Culture STAPHYLOCOCCUS AUREUS (A)  Final   Report Status 10/26/2019 FINAL  Final   Organism ID, Bacteria STAPHYLOCOCCUS AUREUS  Final      Susceptibility   Staphylococcus aureus - MIC*    CIPROFLOXACIN >=8 RESISTANT Resistant     ERYTHROMYCIN >=8 RESISTANT Resistant     GENTAMICIN <=0.5 SENSITIVE Sensitive     OXACILLIN 0.5 SENSITIVE Sensitive     TETRACYCLINE >=16 RESISTANT Resistant     VANCOMYCIN 1 SENSITIVE Sensitive     TRIMETH/SULFA <=10 SENSITIVE Sensitive     CLINDAMYCIN RESISTANT  Resistant     RIFAMPIN <=0.5 SENSITIVE Sensitive     Inducible Clindamycin POSITIVE Resistant     * STAPHYLOCOCCUS AUREUS  Blood Culture (routine x 2)     Status: Abnormal   Collection Time: 10/22/19  3:31 PM   Specimen: Right Antecubital; Blood  Result Value Ref Range Status   Specimen Description   Final    RIGHT ANTECUBITAL Performed at [facility], [address], [place], [place] [address]    Special Requests   Final    BOTTLES DRAWN AEROBIC AND ANAEROBIC Blood Culture adequate volume Performed at [facility], [address], [place], [place] [address]    Culture  Setup Time   Final    GRAM POSITIVE COCCI ANAEROBIC AND AEROBIC BOTTLES Gram Stain Report Called to,Read Back By and Verified With: HEARN,J_2  BY MATTHEWS B 12.17.2020 [facility] Performed at [facility], [address], [place], [place] [address]    Culture (A)  Final    STAPHYLOCOCCUS AUREUS SUSCEPTIBILITIES PERFORMED ON PREVIOUS CULTURE WITHIN THE LAST 5 DAYS. Performed at [facility], [place] [address],  [place], [place] [address]    Report Status 10/26/2019 FINAL  Final  SARS CORONAVIRUS 2 (Fatim Vanderschaaf 6-24 HRS) Nasopharyngeal Nasopharyngeal Swab     Status: None   Collection Time: 10/22/19  4:14 PM   Specimen: Nasopharyngeal Swab  Result Value Ref Range Status   SARS Coronavirus 2 NEGATIVE NEGATIVE Final    Comment: (NOTE) SARS-CoV-2 target nucleic acids are NOT DETECTED. The SARS-CoV-2 RNA is generally detectable in upper and lower respiratory specimens during the acute phase of infection. Negative results do not preclude SARS-CoV-2 infection, do not rule out co-infections with other pathogens, and should not be used as the sole basis for treatment or other patient management decisions. Negative results must be combined with clinical observations, patient history, and epidemiological information. The expected result is Negative. Fact Sheet for  Patients: SugarRoll.be Fact Sheet for Healthcare Providers: https://www.woods-mathews.com/ This test is not yet approved or cleared by the [place] FDA and  has been authorized for detection and/or diagnosis of SARS-CoV-2 by FDA under an Emergency Use Authorization (EUA). This EUA will remain  in effect (meaning this test can be used) for the duration of the COVID-19 declaration under Section 56 4(b)(1) of the Act, 21 U.S.C. section 360bbb-3(b)(1), unless the authorization is terminated or revoked sooner. Performed at [facility], [place] [address], [place], [place] [address]   MRSA PCR Screening     Status: None   Collection Time: 10/22/19  8:11 PM   Specimen: Nasal Mucosa; Nasopharyngeal  Result Value Ref Range Status   MRSA by PCR NEGATIVE NEGATIVE Final    Comment:        The GeneXpert MRSA Assay (FDA approved for NASAL specimens only), is one component of a comprehensive MRSA colonization surveillance program. It is not intended to diagnose MRSA infection nor to guide or monitor treatment for MRSA infections. Performed at [facility], [address] [place], [place] [address]   C difficile quick scan w PCR reflex     Status: None   Collection Time: 10/22/19 11:56 PM   Specimen: STOOL  Result Value Ref Range Status   C Diff antigen NEGATIVE NEGATIVE Final   C Diff toxin NEGATIVE NEGATIVE Final   C Diff interpretation No C. difficile detected.  Final    Comment: Performed at [facility], [address] [place], [place] [address]  Culture, blood (routine x 2)     Status: None (Preliminary result)   Collection Time: 10/23/19  8:44 AM   Specimen: BLOOD RIGHT HAND  Result Value Ref Range Status   Specimen Description BLOOD RIGHT HAND  Final   Special Requests   Final    BOTTLES DRAWN AEROBIC AND ANAEROBIC Blood Culture adequate volume   Culture   Final    NO GROWTH 3 DAYS Performed at [facility], [address],  [place], [place] [address]    Report Status PENDING  Incomplete  Culture, blood (routine x 2)     Status: None (Preliminary result)   Collection Time: 10/23/19  8:46 AM   Specimen: BLOOD RIGHT HAND  Result Value Ref Range Status   Specimen Description BLOOD RIGHT HAND  Final   Special Requests   Final    BOTTLES DRAWN AEROBIC AND ANAEROBIC Blood Culture results may not be optimal due to an excessive volume of blood received in culture bottles   Culture   Final    NO GROWTH 3 DAYS Performed at [facility], [address], [place], [place] [address]  Report Status PENDING  Incomplete  Aerobic/Anaerobic Culture (surgical/deep wound)     Status: None (Preliminary result)   Collection Time: 10/24/19  2:20 PM   Specimen: Soft Tissue, Other  Result Value Ref Range Status   Specimen Description   Final    TOE SOFT TISSUE AND BONE OF 3RD Performed at [facility], [address] [place], [place] [address]    Special Requests   Final    NONE Performed at [facility], [address] [place],  [place] [address]    Gram Stain NO WBC SEEN FEW GRAM POSITIVE COCCI   Final   Culture   Final    MODERATE STAPHYLOCOCCUS AUREUS FEW STREPTOCOCCUS ANGINOSIS SUSCEPTIBILITIES TO FOLLOW HOLDING FOR POSSIBLE ANAEROBE Performed at [facility], [place] [address] [place], [place] [address]    Report Status PENDING  Incomplete   Organism ID, Bacteria STAPHYLOCOCCUS AUREUS  Final      Susceptibility   Staphylococcus aureus - MIC*    CIPROFLOXACIN >=8 RESISTANT Resistant     ERYTHROMYCIN >=8 RESISTANT Resistant     GENTAMICIN <=0.5 SENSITIVE Sensitive     OXACILLIN 0.5 SENSITIVE Sensitive     TETRACYCLINE >=16 RESISTANT Resistant     VANCOMYCIN <=0.5 SENSITIVE Sensitive     TRIMETH/SULFA <=10 SENSITIVE Sensitive     CLINDAMYCIN RESISTANT Resistant     RIFAMPIN <=0.5 SENSITIVE Sensitive     Inducible Clindamycin POSITIVE Resistant     * MODERATE STAPHYLOCOCCUS AUREUS  Culture, blood  (routine x 2)     Status: Abnormal   Collection Time: 10/24/19  6:33 PM   Specimen: BLOOD RIGHT ARM  Result Value Ref Range Status   Specimen Description   Final    BLOOD RIGHT ARM Performed at [facility], [address] [place], [place] [address]    Special Requests   Final    BOTTLES DRAWN AEROBIC AND ANAEROBIC Blood Culture adequate volume Performed at [facility], [address], [place], [place] [address]    Culture  Setup Time   Final    GRAM POSITIVE COCCI ANAEROBIC BOTTLE ONLY Gram Stain Report Called to,Read Back By and Verified With: [id number] ON [id number] BY HENDERSON L. CRITICAL RESULT CALLED TO, READ BACK BY AND VERIFIED WITH: E. MURPHY, RN (APH) AT 8638 ON 10/25/19 BY C. JESSUP, MT.    Culture (A)  Final    STAPHYLOCOCCUS AUREUS SUSCEPTIBILITIES PERFORMED ON PREVIOUS CULTURE WITHIN THE LAST 5 DAYS. Performed at [facility], [place] [address], [place], [place] [address]    Report Status 10/27/2019 FINAL  Final  Blood Culture ID Panel (Reflexed)     Status: Abnormal   Collection Time: 10/24/19  6:33 PM  Result Value Ref Range Status   Enterococcus species NOT DETECTED NOT DETECTED Final   Listeria monocytogenes NOT DETECTED NOT DETECTED Final   Staphylococcus species DETECTED (A) NOT DETECTED Final    Comment: CRITICAL RESULT CALLED TO, READ BACK BY AND VERIFIED WITH: E. MURPHY, RN (APH) AT 1820 ON 10/25/19 BY C. JESSUP, MT.    Staphylococcus aureus (BCID) DETECTED (A) NOT DETECTED Final    Comment: Methicillin (oxacillin) susceptible Staphylococcus aureus (MSSA). Preferred therapy is anti staphylococcal beta lactam antibiotic (Cefazolin or Nafcillin), unless clinically contraindicated. CRITICAL RESULT CALLED TO, READ BACK BY AND VERIFIED WITH: E. MURPHY, RN (APH) AT 1820 ON 10/25/19 BY C. JESSUP, MT.    Methicillin resistance NOT DETECTED NOT DETECTED Final   Streptococcus species NOT DETECTED NOT DETECTED  Final   Streptococcus agalactiae NOT DETECTED NOT  DETECTED Final   Streptococcus pneumoniae NOT DETECTED NOT DETECTED Final   Streptococcus pyogenes NOT DETECTED NOT DETECTED Final   Acinetobacter baumannii NOT DETECTED NOT DETECTED Final   Enterobacteriaceae species NOT DETECTED NOT DETECTED Final   Enterobacter cloacae complex NOT DETECTED NOT DETECTED Final   Escherichia coli NOT DETECTED NOT DETECTED Final   Klebsiella oxytoca NOT DETECTED NOT DETECTED Final   Klebsiella pneumoniae NOT DETECTED NOT DETECTED Final   Proteus species NOT DETECTED NOT DETECTED Final   Serratia marcescens NOT DETECTED NOT DETECTED Final   Haemophilus influenzae NOT DETECTED NOT DETECTED Final   Neisseria meningitidis NOT DETECTED NOT DETECTED Final   Pseudomonas aeruginosa NOT DETECTED NOT DETECTED Final   Candida albicans NOT DETECTED NOT DETECTED Final   Candida glabrata NOT DETECTED NOT DETECTED Final   Candida krusei NOT DETECTED NOT DETECTED Final   Candida parapsilosis NOT DETECTED NOT DETECTED Final   Candida tropicalis NOT DETECTED NOT DETECTED Final    Comment: Performed at [facility], [place] [address] [place], [place] [address]  Culture, blood (routine x 2)     Status: Abnormal   Collection Time: 10/24/19  6:38 PM   Specimen: BLOOD RIGHT HAND  Result Value Ref Range Status   Specimen Description   Final    BLOOD RIGHT HAND Performed at [facility], [address], [place], [place] [address]    Special Requests   Final    BOTTLES DRAWN AEROBIC AND ANAEROBIC Blood Culture adequate volume Performed at [facility], [address], [place], [place] [address]    Culture  Setup Time   Final    GRAM POSITIVE COCCI ANAEROBIC BOTTLE ONLY Gram Stain Report Called to,Read Back By and Verified With: HYLTON @ 3817 ON [id number] BY HENDERSON L. CRITICAL VALUE NOTED.  VALUE IS CONSISTENT WITH PREVIOUSLY REPORTED AND CALLED VALUE.    Culture (A)  Final    STAPHYLOCOCCUS AUREUS SUSCEPTIBILITIES PERFORMED ON PREVIOUS CULTURE WITHIN THE LAST 5  DAYS. Performed at [facility], [place] [address], [place], [place] [address]    Report Status 10/27/2019 FINAL  Final  Culture, blood (routine x 2)     Status: None (Preliminary result)   Collection Time: 10/25/19  2:38 PM   Specimen: Right Antecubital; Blood  Result Value Ref Range Status   Specimen Description   Final    RIGHT ANTECUBITAL BOTTLES DRAWN AEROBIC AND ANAEROBIC   Special Requests Blood Culture adequate volume  Final   Culture   Final    NO GROWTH < 24 HOURS Performed at [facility], [address] [place],  [address]    Report Status PENDING  Incomplete  Culture, blood (routine x 2)     Status: None (Preliminary result)   Collection Time: 10/26/19 11:17 AM   Specimen: BLOOD RIGHT ARM  Result Value Ref Range Status   Specimen Description BLOOD RIGHT ARM  Final   Special Requests   Final    BOTTLES DRAWN AEROBIC AND ANAEROBIC Blood Culture adequate volume Performed at [facility], [address] [place],  [address]    Culture PENDING  Incomplete   Report Status PENDING  Incomplete  Culture, blood (routine x 2)     Status: None (Preliminary result)   Collection Time: 10/26/19 11:17 AM   Specimen: BLOOD LEFT HAND  Result Value Ref Range Status   Specimen Description BLOOD LEFT HAND  Final   Special Requests  Final    BOTTLES DRAWN AEROBIC ONLY Blood Culture adequate volume Performed at [facility], [address] [place], [place] [address]    Culture PENDING  Incomplete   Report Status PENDING  Incomplete     Scheduled Meds: . amiodarone  200 mg Oral BID  . apixaban  5 mg Oral BID  . Chlorhexidine Gluconate Cloth  6 each Topical Q0600  . diltiazem  120 mg Oral Daily  . insulin aspart  0-15 Units Subcutaneous TID WC  . insulin aspart  0-5 Units Subcutaneous QHS  . insulin glargine  30 Units Subcutaneous QHS  . sodium chloride flush  3 mL Intravenous Q12H   Continuous Infusions:  Procedures/Studies: DG Tibia/Fibula Right  Result  Date: 10/22/2019 CLINICAL DATA:  Right leg pain and tenderness. Fever. Clinical evidence of infection. Diabetes. EXAM: RIGHT TIBIA AND FIBULA - 2 VIEW COMPARISON:  Right ankle radiographs obtained at the same time. FINDINGS: Mild diffuse soft tissue swelling. Previously described chronic appearing periosteal reaction involving the distal tibia. On these radiographs, this is involving the medial and posterior aspects of the tibia. Also again noted are previously described bone collapse and fragmentation involving the talotibial joint and flattening of the normal plantar arch. No soft tissue gas is seen. Mild medial and lateral spur formation at the knee joint with mild to moderate medial joint space narrowing. IMPRESSION: 1. Chronic appearing periosteal reaction involving the posterior and medial aspects of the distal tibia. Underlying chronic osteomyelitis is a possibility. 2. Diffuse soft tissue swelling without gas or bone destruction. 3. 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 Electronically Signed   By: Claudie Revering M.D.   On: 10/22/2019 16:05   DG Ankle Complete Right  Result Date: 10/22/2019 CLINICAL DATA:  Right leg pain and tenderness and fever with clinical evidence of infection. Diabetes. EXAM: RIGHT ANKLE - COMPLETE 3+ VIEW COMPARISON:  Right foot radiographs obtained today and on 06/19/2016. FINDINGS: Diffuse soft tissue swelling. Bone collapse and fragmentation at the talotibial joint, described on the foot radiographs, in addition to previously described flattening of the normal plantar arch. Again demonstrated are is chronic appearing posterior periosteal reaction involving the distal tibia. No bone destruction or soft tissue gas seen. IMPRESSION: 1. Diffuse soft tissue swelling without underlying changes of acute osteomyelitis. 2. Neuropathic changes at the talotibial joint. 3. Pes planus. Electronically Signed   By: Claudie Revering M.D.   On: 10/22/2019 16:02   MR FOOT  RIGHT W WO CONTRAST  Result Date: 10/23/2019 CLINICAL DATA:  Nonhealing ulcer involving the third toe. History of prior amputations. EXAM: MRI OF THE RIGHT FOREFOOT WITHOUT AND WITH CONTRAST TECHNIQUE: Multiplanar, multisequence MR imaging of the right foot was performed before and after the administration of intravenous contrast. CONTRAST:  8m GADAVIST GADOBUTROL 1 MMOL/ML IV SOLN COMPARISON:  Radiographs 10/22/2019 FINDINGS: Abnormal T1 and T2 signal intensity in the proximal phalanx of the third toe and also in what appears to be a small residual portion of the middle phalanx. There is also associated abnormal contrast enhancement and surrounding cellulitis. Findings consistent with osteomyelitis. No other definite sites of acute osteomyelitis are identified. Evidence of prior amputations involving the first and fifth toes and parts of the other toes. The metatarsal bones are intact. No findings for septic arthritis involving the remaining MTP joints or involving the midfoot joint spaces. There is moderate edema like signal abnormality in the navicular bone along with some enhancement. Could not exclude osteomyelitis. Severe diffuse cellulitis  involving the entire midfoot and forefoot. Mild diffuse myositis without findings for pyomyositis. IMPRESSION: 1. Osteomyelitis involving the third digit as detailed above. 2. Abnormal signal intensity and enhancement in the navicular bone. This is not completely covered but could not exclude osteomyelitis. 3. Severe diffuse cellulitis and mild diffuse myositis. Electronically Signed   By: Marijo Sanes M.D.   On: 10/23/2019 16:50   US Venous Img Lower Unilateral Right (DVT)  Result Date: 10/25/2019 CLINICAL DATA:  Right calf pain and edema. EXAM: RIGHT LOWER EXTREMITY VENOUS DOPPLER ULTRASOUND TECHNIQUE: Gray-scale sonography with graded compression, as well as color Doppler and duplex ultrasound were performed to evaluate the lower extremity deep venous systems  from the level of the common femoral vein and including the common femoral, femoral, profunda femoral, popliteal and calf veins including the posterior tibial, peroneal and gastrocnemius veins when visible. The superficial great saphenous vein was also interrogated. Spectral Doppler was utilized to evaluate flow at rest and with distal augmentation maneuvers in the common femoral, femoral and popliteal veins. COMPARISON:  None. FINDINGS: Contralateral Common Femoral Vein: Respiratory phasicity is normal and symmetric with the symptomatic side. No evidence of thrombus. Normal compressibility. Common Femoral Vein: No evidence of thrombus. Normal compressibility, respiratory phasicity and response to augmentation. Saphenofemoral Junction: No evidence of thrombus. Normal compressibility and flow on color Doppler imaging. Profunda Femoral Vein: No evidence of thrombus. Normal compressibility and flow on color Doppler imaging. Femoral Vein: No evidence of thrombus. Normal compressibility, respiratory phasicity and response to augmentation. Popliteal Vein: Occlusive thrombus present in the right popliteal vein. Calf Veins: Posterior tibial vein normally patent. Visualized peroneal vein demonstrates some nonocclusive thrombus in the upper calf. Superficial Great Saphenous Vein: No evidence of thrombus. Normal compressibility. Venous Reflux:  None. Other Findings: No evidence of superficial thrombophlebitis or abnormal fluid collection. IMPRESSION: Positive for right lower extremity DVT with occlusive thrombus in the popliteal vein and nonocclusive thrombus in the peroneal vein. Electronically Signed   By: Aletta Edouard M.D.   On: 10/25/2019 14:21   DG Chest Port 1 View  Result Date: 10/22/2019 CLINICAL DATA:  Fever EXAM: PORTABLE CHEST 1 VIEW COMPARISON:  12/09/2013 chest radiograph. FINDINGS: Stable cardiomediastinal silhouette with top-normal heart size. No pneumothorax. No pleural effusion. Lungs appear clear,  with no acute consolidative airspace disease and no pulmonary edema. IMPRESSION: No active disease. Electronically Signed   By: Ilona Sorrel M.D.   On: 10/22/2019 15:55   DG Foot Complete Right  Result Date: 10/24/2019 CLINICAL DATA:  Patient status post amputation of the right third metatarsal head and toe today. EXAM: RIGHT FOOT COMPLETE - 3+ VIEW COMPARISON:  Plain films the right foot 10/22/2019. FINDINGS: The right third toe and head and neck of the third metatarsal have been amputated. Surgical wound with packing noted. The patient is status post prior amputation of the great toe and distal phalanx of the second toe. 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 No acute abnormality. Electronically Signed   By: Inge Rise M.D.   On: 10/24/2019 15:33   DG Foot Complete Right  Result Date: 10/22/2019 CLINICAL DATA:  Right foot pain and fever. Diabetes. Clinical infection. EXAM: RIGHT FOOT COMPLETE - 3+ VIEW COMPARISON:  06/19/2016. FINDINGS: Diffuse soft tissue swelling. Again demonstrated surgical absence of the right great toe with interval surgical absence of the right little toe. Well-defined bone margins. No periosteal reaction or bone destruction involving the bones of the foot.  There is flattening of the normal plantar arch and interval partial bone collapse and fragmentation involving the distal tibia and talus. There is also chronic appearing periosteal thickening and irregularity involving the distal tibia posteriorly. IMPRESSION: 1. Diffuse soft tissue swelling without soft tissue gas or evidence of underlying osteomyelitis. 2. Pes planus. 3. 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 Electronically Signed   By: Claudie Revering M.D.   On: 10/22/2019 15:59   ECHOCARDIOGRAM COMPLETE  Result Date: 10/23/2019   ECHOCARDIOGRAM REPORT   Patient Name:   HRIDAY STAI Date of Exam: 10/23/2019 Medical Rec  #:  [id number]     Height:       76.0 in Accession #:    [id number]    Weight:       206.8 lb Date of Birth:  Sep 10, 1966    BSA:          2.25 m Patient Age:    81 years      BP:           99/54 mmHg Patient Gender: M             HR:           120 bpm. Exam Location:  [facility] Procedure: 2D Echo, Cardiac Doppler and Color Doppler Indications:    Atrial Fibrillation 427.31 / I48.91  History:        Patient has no prior history of Echocardiogram examinations.                 Arrythmias:Atrial Fibrillation; Risk Factors:Diabetes and                 Hypertension.  Sonographer:    Alvino Chapel RCS Referring Phys: [phone] [place] Rister IMPRESSIONS  1. Left ventricular ejection fraction, by visual estimation, is 70 to 75%. The left ventricle has hyperdynamic function. There is mildly increased left ventricular hypertrophy.  2. Left ventricular diastolic parameters are indeterminate.  3. The left ventricle has no regional wall motion abnormalities.  4. Global right ventricle has normal systolic function.The right ventricular size is normal. No increase in right ventricular wall thickness.  5. Left atrial size was normal.  6. Right atrial size was normal.  7. The mitral valve is grossly normal. Trivial mitral valve regurgitation.  8. The tricuspid valve is grossly normal. Tricuspid valve regurgitation is trivial.  9. The aortic valve is tricuspid. Aortic valve regurgitation is not visualized. 10. The pulmonic valve was grossly normal. Pulmonic valve regurgitation is not visualized. 11. Mildly elevated pulmonary artery systolic pressure. 12. The tricuspid regurgitant velocity is 3.04 m/s, and with an assumed right atrial pressure of 3 mmHg, the estimated right ventricular systolic pressure is mildly elevated at 40.0 mmHg. 13. The inferior vena cava is normal in size with greater than 50% respiratory variability, suggesting right atrial pressure of 3 mmHg. FINDINGS  Left Ventricle: Left ventricular ejection fraction, by visual  estimation, is 70 to 75%. The left ventricle has hyperdynamic function. The left ventricle has no regional wall motion abnormalities. The left ventricular internal cavity size was the left ventricle is normal in size. There is mildly increased left ventricular hypertrophy. Septal left ventricular hypertrophy. Left ventricular diastolic parameters are indeterminate. Right Ventricle: The right ventricular size is normal. No increase in right ventricular wall thickness. Global RV systolic function is has normal systolic function. The tricuspid regurgitant velocity is 3.04 m/s, and with an assumed right atrial pressure  of 3 mmHg, the  estimated right ventricular systolic pressure is mildly elevated at 40.0 mmHg. Left Atrium: Left atrial size was normal in size. Right Atrium: Right atrial size was normal in size Pericardium: There is no evidence of pericardial effusion. Mitral Valve: The mitral valve is grossly normal. Trivial mitral valve regurgitation. Tricuspid Valve: The tricuspid valve is grossly normal. Tricuspid valve regurgitation is trivial. Aortic Valve: The aortic valve is tricuspid. Aortic valve regurgitation is not visualized. Pulmonic Valve: The pulmonic valve was grossly normal. Pulmonic valve regurgitation is not visualized. Pulmonic regurgitation is not visualized. Aorta: The aortic root is normal in size and structure. Venous: The inferior vena cava is normal in size with greater than 50% respiratory variability, suggesting right atrial pressure of 3 mmHg. IAS/Shunts: No atrial level shunt detected by color flow Doppler.  LEFT VENTRICLE PLAX 2D LVIDd:         4.44 cm LVIDs:         2.56 cm LV PW:         1.00 cm LV IVS:        1.26 cm LVOT diam:     2.00 cm LV SV:         66 ml LV SV Index:   29.33 LVOT Area:     3.14 cm  RIGHT VENTRICLE TAPSE (M-mode): 1.7 cm LEFT ATRIUM             Index       RIGHT ATRIUM           Index LA diam:        3.30 cm 1.47 cm/m  RA Area:     20.60 cm LA Vol (A2C):   52.0  ml 23.14 ml/m RA Volume:   57.20 ml  25.46 ml/m LA Vol (A4C):   40.9 ml 18.20 ml/m LA Biplane Vol: 47.2 ml 21.01 ml/m  AORTIC VALVE LVOT Vmax:   129.00 cm/s LVOT Vmean:  82.933 cm/s LVOT VTI:    0.185 m  AORTA Ao Root diam: 3.30 cm MITRAL VALVE                        TRICUSPID VALVE MV Area (PHT): 3.21 cm             TR Peak grad:   37.0 mmHg MV PHT:        68.44 msec           TR Vmax:        304.00 cm/s MV Decel Time: 236 msec MV E velocity: 107.00 cm/s 103 cm/s SHUNTS                                     Systemic VTI:  0.19 m                                     Systemic Diam: 2.00 cm  Rozann Lesches MD Electronically signed by Rozann Lesches MD Signature Date/Time: 10/23/2019/9:39:01 AM    Final     Orson Eva, DO  [facility] Pager [phone]  If 7PM-7AM, please contact night-coverage [email or web address] Password [email or web address] 10/27/2019, 5:53 PM   LOS: 5 days

## 2019-10-28 ENCOUNTER — Inpatient Hospital Stay (HOSPITAL_COMMUNITY): Payer: Medicare Other

## 2019-10-28 DIAGNOSIS — E1111 Type 2 diabetes mellitus with ketoacidosis with coma: Secondary | ICD-10-CM

## 2019-10-28 LAB — CULTURE, BLOOD (ROUTINE X 2)
Culture: NO GROWTH
Culture: NO GROWTH
Special Requests: ADEQUATE
Special Requests: ADEQUATE

## 2019-10-28 LAB — AEROBIC/ANAEROBIC CULTURE W GRAM STAIN (SURGICAL/DEEP WOUND): Gram Stain: NONE SEEN

## 2019-10-28 LAB — BASIC METABOLIC PANEL
Anion gap: 12 (ref 5–15)
BUN: 16 mg/dL (ref 6–20)
CO2: 29 mmol/L (ref 22–32)
Calcium: 7.5 mg/dL — ABNORMAL LOW (ref 8.9–10.3)
Chloride: 91 mmol/L — ABNORMAL LOW (ref 98–111)
Creatinine, Ser: 0.66 mg/dL (ref 0.61–1.24)
GFR calc Af Amer: >60 mL/min (ref >60)
GFR calc non Af Amer: >60 mL/min (ref >60)
Glucose, Bld: 112 mg/dL — ABNORMAL HIGH (ref 70–99)
Potassium: 3.8 mmol/L (ref 3.5–5.1)
Sodium: 132 mmol/L — ABNORMAL LOW (ref 135–145)

## 2019-10-28 LAB — GLUCOSE, CAPILLARY
Glucose-Capillary: 103 mg/dL — ABNORMAL HIGH (ref 70–99)
Glucose-Capillary: 173 mg/dL — ABNORMAL HIGH (ref 70–99)
Glucose-Capillary: 164 mg/dL — ABNORMAL HIGH (ref 70–99)
Glucose-Capillary: 171 mg/dL — ABNORMAL HIGH (ref 70–99)

## 2019-10-28 LAB — CBC
HCT: 38.7 % — ABNORMAL LOW (ref 39.0–52.0)
Hemoglobin: 12.7 g/dL — ABNORMAL LOW (ref 13.0–17.0)
MCH: 30 pg (ref 26.0–34.0)
MCHC: 32.8 g/dL (ref 30.0–36.0)
MCV: 91.3 fL (ref 80.0–100.0)
Platelets: 308 10*3/uL (ref 150–400)
RBC: 4.24 MIL/uL (ref 4.22–5.81)
RDW: 13 % (ref 11.5–15.5)
WBC: 19.6 10*3/uL — ABNORMAL HIGH (ref 4.0–10.5)
nRBC: 0 % (ref 0.0–0.2)

## 2019-10-28 LAB — MAGNESIUM: Magnesium: 1.8 mg/dL (ref 1.7–2.4)

## 2019-10-28 LAB — SURGICAL PATHOLOGY

## 2019-10-28 MED ORDER — APIXABAN 5 MG PO TABS
10.0000 mg | ORAL_TABLET | Freq: Two times a day (BID) | ORAL | Status: DC
  Administered 2019-10-28 – 2019-10-30 (×5): 10 mg via ORAL
  Filled 2019-10-28 (×5): qty 2

## 2019-10-28 MED ORDER — APIXABAN 5 MG PO TABS: 5.0000 mg | ORAL_TABLET | Freq: Two times a day (BID) | ORAL | Status: DC

## 2019-10-28 NOTE — Progress Notes (Addendum)
Progress Note  Patient Name: [name] Date of Encounter: 10/28/2019  Primary Cardiologist: Nona Dell, MD   Subjective   No chest pain or palpitations. Overall unaware of his arrhythmia this admission.   Inpatient Medications    Scheduled Meds:  amiodarone  200 mg Oral BID   apixaban  5 mg Oral BID   Chlorhexidine Gluconate Cloth  6 each Topical Q0600   diltiazem  120 mg Oral Daily   insulin aspart  0-15 Units Subcutaneous TID WC   insulin aspart  0-5 Units Subcutaneous QHS   insulin glargine  30 Units Subcutaneous QHS   sodium chloride flush  3 mL Intravenous Q12H   Continuous Infusions:  ampicillin-sulbactam (UNASYN) IV 3 g (10/28/19 0259)   PRN Meds: acetaminophen **OR** acetaminophen, fentaNYL (SUBLIMAZE) injection, fluticasone, promethazine   Vital Signs    Vitals:   10/27/19 1947 10/28/19 0500 10/28/19 0534 10/28/19 0749  BP: (!) 156/104  130/76   Pulse: 100  81   Resp: 17  16   Temp: 98.9 F (37.2 C)  98.7 F (37.1 C)   TempSrc: Oral  Oral   SpO2: 97%  97% 96%  Weight:  102 kg    Height:        Intake/Output Summary (Last 24 hours) at 10/28/2019 0754 Last data filed at 10/28/2019 0500 Gross per 24 hour  Intake 1418.43 ml  Output 1050 ml  Net 368.43 ml    Last 3 Weights 10/28/2019 10/27/2019 10/27/2019  Weight (lbs) 224 lb 13.9 oz 224 lb 13.9 oz 223 lb 1.7 oz  Weight (kg) 102 kg 102 kg 101.2 kg      Telemetry    Converted back to NSR around 2330 on 12/21. Has been in NSR since with rate in the 60's to 70's. - Personally Reviewed  ECG    No new tracings.   Physical Exam   General: Well developed, well nourished, male appearing in no acute distress. Head: Normocephalic, atraumatic.  Neck: Supple without bruits, JVD not elevated. Lungs:  Resp regular and unlabored, CTA without wheezing or rales. Heart: RRR, S1, S2, no S3, S4, or murmur; no rub. Abdomen: Soft, non-tender, non-distended with normoactive bowel sounds. No  hepatomegaly. No rebound/guarding. No obvious abdominal masses. Extremities: No clubbing, cyanosis. 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  Neuro: Alert and oriented X 3. Moves all extremities spontaneously. Psych: Normal affect.  Labs    Chemistry Recent Labs  Lab 10/22/19 1520 10/23/19 0359 10/24/19 0357 10/26/19 0444 10/27/19 0342 10/28/19 0353  NA 125* 132* 131* 130* 131* 132*  K 3.4* 3.3* 3.8 3.5 3.4* 3.8  CL 89* 98 96* 96* 93* 91*  CO2 21* 22 28 25 29 29   GLUCOSE 389* 266* 242* 198* 160* 112*  BUN 40* 31* 25* 18 14 16   CREATININE 1.42* 0.97 0.87 0.69 0.65 0.66  CALCIUM 8.8* 8.5* 8.2* 7.8* 7.5* 7.5*  PROT 7.5 6.0* 5.4*  --   --   --   ALBUMIN 3.4* 2.6* 2.1*  --   --   --   AST 28 25 22   --   --   --   ALT 30 28 27   --   --   --   ALKPHOS 123 83 69  --   --   --   BILITOT 2.0* 1.6* 1.3*  --   --   --   GFRNONAA 55* >60 >60 >60 >60 >60  GFRAA >60 >60 >60 >  60 >60 >60  ANIONGAP 15 12 7 9 9 12      Hematology Recent Labs  Lab 10/26/19 0444 10/27/19 0342 10/28/19 0353  WBC 11.9* 16.0* 19.6*  RBC 4.22 4.28 4.24  HGB 12.6* 12.7* 12.7*  HCT 37.5* 38.4* 38.7*  MCV 88.9 89.7 91.3  MCH 29.9 29.7 30.0  MCHC 33.6 33.1 32.8  RDW 12.3 12.6 13.0  PLT 185 256 308    Cardiac EnzymesNo results for input(s): TROPONINI in the last 168 hours. No results for input(s): TROPIPOC in the last 168 hours.   BNPNo results for input(s): BNP, PROBNP in the last 168 hours.   DDimer No results for input(s): DDIMER in the last 168 hours.   Radiology    No results found.  Cardiac Studies   Echocardiogram: 10/23/2019 IMPRESSIONS      1. Left ventricular ejection fraction, by visual estimation, is 70 to 75%. The left ventricle has hyperdynamic function. There is mildly increased left ventricular hypertrophy.  2. Left ventricular diastolic parameters are indeterminate.  3. The left ventricle has no regional wall motion abnormalities.  4. Global right ventricle has  normal systolic function.The right ventricular size is normal. No increase in right ventricular wall thickness.  5. Left atrial size was normal.  6. Right atrial size was normal.  7. The mitral valve is grossly normal. Trivial mitral valve regurgitation.  8. The tricuspid valve is grossly normal. Tricuspid valve regurgitation is trivial.  9. The aortic valve is tricuspid. Aortic valve regurgitation is not visualized. 10. The pulmonic valve was grossly normal. Pulmonic valve regurgitation is not visualized. 11. Mildly elevated pulmonary artery systolic pressure. 12. The tricuspid regurgitant velocity is 3.04 m/s, and with an assumed right atrial pressure of 3 mmHg, the estimated right ventricular systolic pressure is mildly elevated at 40.0 mmHg. 13. The inferior vena cava is normal in size with greater than 50% respiratory variability, suggesting right atrial pressure of 3 mmHg.  Patient Profile     53 y.o. male w/ PMH of Type 2 DM, HTN, HLD and Charcot foot who is currently admitted for sepsis in the setting of right foot infection and MSSA cellulitis with osteomyelitis. Cardiology consulted for pre-op clearance and new-onset atrial fibrillation with RVR.   Assessment & Plan    1. Atrial Fibrillation with RVR  - new diagnosis this admission and likely secondary to his acute illness. Initially on IV Cardizem and IV Amiodarone but now transitioned to Amiodarone 200mg  BID and Cardizem CD 120mg  daily. Converted back to NSR at 2330 on 12/21 and maintaining NSR this morning. Continue at current dosing for now. Will change TEE/DCCV to just TEE alone in the setting of bacteremia. - he is currently on Eliquis 5mg  BID for anticoagulation but given his new diagnosis of a DVT this admission, anticipate he will require the higher DVT dosing.   2. 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 Underwent right 3rd ray amputation on  10/24/2019. He has been changed to Unasyn by ID who recommended a TEE to determine length of antibiotic coverage. WBC elevated to 19.6 today. No schedule availability for today. Have left a message with short-stay checking availability for tomorrow.     3. AKI - creatinine elevated to 1.42 on admission, improved to 0.66 today.   4. Type 2 DM - Hgb A1c elevated to 9.8 this admission. Further management per admitting team.   5. DVT - new diagnosis for the patient  this admission. He was on Heparin but is currently on Eliquis (atrial fibrillation dosing and not DVT dosing). Will need to review with Pharmacy.    For questions or updates, please contact [place] Please consult [email or web address] for contact info under Cardiology/STEMI.   Arna Medici , PA-C 7:54 AM 10/28/2019 Pager: [phone]  Patient examined chart reviewed. Exam with left BKA and infection right foot in boot. He has DVT on eliquis should be higher DVT dosing to start Telemetry review shows he has converted to NSR from afib. Still needs TEE for bacteremia r/o SBE. PA has arranged for Dr Harrington Challenger to do 12/23 at 11:30 am Procedure and risks discussed with patient and willing to proceed Orders written  Jenkins Rouge MD [facility]

## 2019-10-28 NOTE — Progress Notes (Signed)
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  Feeling better.  Denies any nausea, vomiting, shortness of breath or chest pain.  Some discomfort behind knee at times.  Controlled with pain medication.  No foot or ankle pain.  "I took off the boot.  It was hot."  Objective Vital signs in last 24 hours:   Temp:  [98.7 F (37.1 C)-99.5 F (37.5 C)] 98.7 F (37.1 C) (12/22 0534) Pulse Rate:  [81-106] 81 (12/22 0534) Resp:  [16-28] 16 (12/22 0534) BP: (130-156)/(76-104) 130/76 (12/22 0534) SpO2:  [95 %-98 %] 96 % (12/22 0749) Weight:  [102 kg] 102 kg (12/22 0500)  DRESSING(S): Intact without strikethrough.  Heel protective boot not in place. INTEGUMENT: Skin edges at amputation site remain viable.  Wound bed viable with no necrosis present.  Further improvement in erythema of foot and ankle.  No skin changes along the posterior of the right heel.   VASCULAR: Pedal pulses are palpable.  Edema of the RLE.  Improved from yesterday.   MUSCULOSKELETAL: R calf is soft and nontender.  Charcot deformity of R foot.  This is not a new finding for this patient.  R ankle is NTTP.  Lab/Test Results  Recent Labs    10/27/19 0342 10/28/19 0353  WBC 16.0* 19.6*  HGB 12.7* 12.7*  HCT 38.4* 38.7*  PLT 256 308  NA 131* 132*  K 3.4* 3.8  CL 93* 91*  CO2 29 29  BUN 14 16  CREATININE 0.65 0.66  GLUCOSE 160* 112*  CALCIUM 7.5* 7.5*    Recent Results (from the past 240 hour(s))  Urine culture     Status: Abnormal   Collection Time: 10/22/19  3:08 PM   Specimen: In/Out Cath Urine  Result Value Ref Range Status   Specimen Description   Final    IN/OUT CATH URINE Performed at [facility], [address], [place], [place] [address]    Special Requests   Final    NONE Performed at [facility], [address] [place], [place] [address]    Culture (A)  Final    <10,000 COLONIES/mL INSIGNIFICANT GROWTH Performed at [facility], [address] [place],  [place] [address]    Report Status 10/23/2019 FINAL  Final  Blood Culture (routine x 2)     Status: Abnormal   Collection Time: 10/22/19  3:20 PM   Specimen: BLOOD LEFT WRIST  Result Value Ref Range Status   Specimen Description   Final    BLOOD LEFT WRIST Performed at [facility], [address], [place], [place] [address]    Special Requests   Final    BOTTLES DRAWN AEROBIC AND ANAEROBIC Blood Culture adequate volume Performed at [facility], [address], [place], [place] [address]    Culture  Setup Time   Final    GRAM POSITIVE COCCI BOTH ANAEROBIC AND AEROBIC BOTTLES Gram Stain Report Called to,Read Back By and Verified With: HEARN,[email or web address]  BY MATTHEWS, B 12.17.2020 [facility] Performed at [facility], [address] [place], [place] [address]    Culture STAPHYLOCOCCUS AUREUS (A)  Final   Report Status 10/26/2019 FINAL  Final   Organism ID, Bacteria STAPHYLOCOCCUS AUREUS  Final      Susceptibility   Staphylococcus aureus - MIC*    CIPROFLOXACIN >=8 RESISTANT Resistant     ERYTHROMYCIN >=8 RESISTANT Resistant     GENTAMICIN <=0.5 SENSITIVE Sensitive     OXACILLIN 0.5 SENSITIVE Sensitive  TETRACYCLINE >=16 RESISTANT Resistant     VANCOMYCIN 1 SENSITIVE Sensitive     TRIMETH/SULFA <=10 SENSITIVE Sensitive     CLINDAMYCIN RESISTANT Resistant     RIFAMPIN <=0.5 SENSITIVE Sensitive     Inducible Clindamycin POSITIVE Resistant     * STAPHYLOCOCCUS AUREUS  Blood Culture (routine x 2)     Status: Abnormal   Collection Time: 10/22/19  3:31 PM   Specimen: Right Antecubital; Blood  Result Value Ref Range Status   Specimen Description   Final    RIGHT ANTECUBITAL Performed at [facility], [address], [place], [place] [address]    Special Requests   Final    BOTTLES DRAWN AEROBIC AND ANAEROBIC Blood Culture adequate volume Performed at [facility], [address], [place], [place] [address]    Culture  Setup Time   Final    GRAM POSITIVE COCCI ANAEROBIC AND AEROBIC  BOTTLES Gram Stain Report Called to,Read Back By and Verified With: HEARN,J@[id number]  BY MATTHEWS B 12.17.2020 [facility] Performed at [facility], [address] [place], [place] [address]    Culture (A)  Final    STAPHYLOCOCCUS AUREUS SUSCEPTIBILITIES PERFORMED ON PREVIOUS CULTURE WITHIN THE LAST 5 DAYS. Performed at [facility], [address], [place], [place] [address]    Report Status 10/26/2019 FINAL  Final  SARS CORONAVIRUS 2 (TAT 6-24 HRS) Nasopharyngeal Nasopharyngeal Swab     Status: None   Collection Time: 10/22/19  4:14 PM   Specimen: Nasopharyngeal Swab  Result Value Ref Range Status   SARS Coronavirus 2 NEGATIVE NEGATIVE Final    Comment: (NOTE) SARS-CoV-2 target nucleic acids are NOT DETECTED. The SARS-CoV-2 RNA is generally detectable in upper and lower respiratory specimens during the acute phase of infection. Negative results do not preclude SARS-CoV-2 infection, do not rule out co-infections with other pathogens, and should not be used as the sole basis for treatment or other patient management decisions. Negative results must be combined with clinical observations, patient history, and epidemiological information. The expected result is Negative. Fact Sheet for Patients: HairSlick.no Fact Sheet for Healthcare Providers: quierodirigir.com This test is not yet approved or cleared by the [place] FDA and  has been authorized for detection and/or diagnosis of SARS-CoV-2 by FDA under an Emergency Use Authorization (EUA). This EUA will remain  in effect (meaning this test can be used) for the duration of the COVID-19 declaration under Section 56 4(b)(1) of the Act, 21 U.S.C. section 360bbb-3(b)(1), unless the authorization is terminated or revoked sooner. Performed at [facility], [address], [place], [place] [address]   MRSA PCR Screening     Status: None   Collection Time: 10/22/19   8:11 PM   Specimen: Nasal Mucosa; Nasopharyngeal  Result Value Ref Range Status   MRSA by PCR NEGATIVE NEGATIVE Final    Comment:        The GeneXpert MRSA Assay (FDA approved for NASAL specimens only), is one component of a comprehensive MRSA colonization surveillance program. It is not intended to diagnose MRSA infection nor to guide or monitor treatment for MRSA infections. Performed at [facility], [address] [place], [place] [address]   C difficile quick scan w PCR reflex     Status: None   Collection Time: 10/22/19 11:56 PM   Specimen: STOOL  Result Value Ref Range Status   C Diff antigen NEGATIVE NEGATIVE Final   C Diff toxin NEGATIVE NEGATIVE Final   C Diff interpretation No  C. difficile detected.  Final    Comment: Performed at [facility], [address] [place], [place] [address]  Culture, blood (routine x 2)     Status: None   Collection Time: 10/23/19  8:44 AM   Specimen: BLOOD RIGHT HAND  Result Value Ref Range Status   Specimen Description BLOOD RIGHT HAND  Final   Special Requests   Final    BOTTLES DRAWN AEROBIC AND ANAEROBIC Blood Culture adequate volume   Culture   Final    NO GROWTH 5 DAYS Performed at [facility], [address] [place], [place] [address]    Report Status 10/28/2019 FINAL  Final  Culture, blood (routine x 2)     Status: None   Collection Time: 10/23/19  8:46 AM   Specimen: BLOOD RIGHT HAND  Result Value Ref Range Status   Specimen Description BLOOD RIGHT HAND  Final   Special Requests   Final    BOTTLES DRAWN AEROBIC AND ANAEROBIC Blood Culture results may not be optimal due to an excessive volume of blood received in culture bottles   Culture   Final    NO GROWTH 5 DAYS Performed at [facility], [address] [place], [place] [address]    Report Status 10/28/2019 FINAL  Final  Aerobic/Anaerobic Culture (surgical/deep wound)     Status: None   Collection Time: 10/24/19  2:20 PM   Specimen: Soft Tissue, Other  Result  Value Ref Range Status   Specimen Description   Final    TOE SOFT TISSUE AND BONE OF 3RD Performed at [facility], [address] [place], [place] [address]    Special Requests   Final    NONE Performed at [facility], [address] [place], [place] [address]    Gram Stain NO WBC SEEN FEW GRAM POSITIVE COCCI   Final   Culture   Final    MODERATE STAPHYLOCOCCUS AUREUS FEW STREPTOCOCCUS ANGINOSIS FEW PREVOTELLA MELANINOGENICA BETA LACTAMASE POSITIVE Performed at [facility], [address] [place], [place] [address]    Report Status 10/28/2019 FINAL  Final   Organism ID, Bacteria STAPHYLOCOCCUS AUREUS  Final   Organism ID, Bacteria STREPTOCOCCUS ANGINOSIS  Final      Susceptibility   Staphylococcus aureus - MIC*    CIPROFLOXACIN >=8 RESISTANT Resistant     ERYTHROMYCIN >=8 RESISTANT Resistant     GENTAMICIN <=0.5 SENSITIVE Sensitive     OXACILLIN 0.5 SENSITIVE Sensitive     TETRACYCLINE >=16 RESISTANT Resistant     VANCOMYCIN <=0.5 SENSITIVE Sensitive     TRIMETH/SULFA <=10 SENSITIVE Sensitive     CLINDAMYCIN RESISTANT Resistant     RIFAMPIN <=0.5 SENSITIVE Sensitive     Inducible Clindamycin POSITIVE Resistant     * MODERATE STAPHYLOCOCCUS AUREUS   Streptococcus anginosis - MIC*    PENICILLIN <=0.06 SENSITIVE Sensitive     CEFTRIAXONE <=0.12 SENSITIVE Sensitive     ERYTHROMYCIN >=8 RESISTANT Resistant     LEVOFLOXACIN <=0.25 SENSITIVE Sensitive     VANCOMYCIN 0.5 SENSITIVE Sensitive     * FEW STREPTOCOCCUS ANGINOSIS  Culture, blood (routine x 2)     Status: Abnormal   Collection Time: 10/24/19  6:33 PM   Specimen: BLOOD RIGHT ARM  Result Value Ref Range Status   Specimen Description   Final    BLOOD RIGHT ARM Performed at [facility], [address] [place], [place] [address]    Special Requests   Final    BOTTLES  DRAWN AEROBIC AND ANAEROBIC Blood Culture adequate volume Performed at [facility], [address] [place], [place] [address]     Culture  Setup Time   Final    IN BOTH AEROBIC AND ANAEROBIC BOTTLES CORRECTED ON 12/22 AT 0140: PREVIOUSLY REPORTED AS GRAM POSITIVE COCCI ANAEROBIC BOTTLE ONLY Gram Stain Report Called to,Read Back By and Verified With: HYLTON 1238 ON [id number] BY HENDERSON L. CRITICAL RESULT CALLED TO, READ BACK BY AND VERIFIED WITH: E. MURPHY, RN (APH) AT 1820 ON 10/25/19 BY C. JESSUP, MT.    Culture (A)  Final    STAPHYLOCOCCUS AUREUS SUSCEPTIBILITIES PERFORMED ON PREVIOUS CULTURE WITHIN THE LAST 5 DAYS. Performed at [facility], [address], [place], [place] [address]    Report Status 10/27/2019 FINAL  Final  Blood Culture ID Panel (Reflexed)     Status: Abnormal   Collection Time: 10/24/19  6:33 PM  Result Value Ref Range Status   Enterococcus species NOT DETECTED NOT DETECTED Final   Listeria monocytogenes NOT DETECTED NOT DETECTED Final   Staphylococcus species DETECTED (A) NOT DETECTED Final    Comment: CRITICAL RESULT CALLED TO, READ BACK BY AND VERIFIED WITH: E. MURPHY, RN (APH) AT 1820 ON 10/25/19 BY C. JESSUP, MT.    Staphylococcus aureus (BCID) DETECTED (A) NOT DETECTED Final    Comment: Methicillin (oxacillin) susceptible Staphylococcus aureus (MSSA). Preferred therapy is anti staphylococcal beta lactam antibiotic (Cefazolin or Nafcillin), unless clinically contraindicated. CRITICAL RESULT CALLED TO, READ BACK BY AND VERIFIED WITH: E. MURPHY, RN (APH) AT 1820 ON 10/25/19 BY C. JESSUP, MT.    Methicillin resistance NOT DETECTED NOT DETECTED Final   Streptococcus species NOT DETECTED NOT DETECTED Final   Streptococcus agalactiae NOT DETECTED NOT DETECTED Final   Streptococcus pneumoniae NOT DETECTED NOT DETECTED Final   Streptococcus pyogenes NOT DETECTED NOT DETECTED Final   Acinetobacter baumannii NOT DETECTED NOT DETECTED Final   Enterobacteriaceae species NOT DETECTED NOT DETECTED Final   Enterobacter cloacae complex NOT DETECTED NOT DETECTED Final   Escherichia coli NOT  DETECTED NOT DETECTED Final   Klebsiella oxytoca NOT DETECTED NOT DETECTED Final   Klebsiella pneumoniae NOT DETECTED NOT DETECTED Final   Proteus species NOT DETECTED NOT DETECTED Final   Serratia marcescens NOT DETECTED NOT DETECTED Final   Haemophilus influenzae NOT DETECTED NOT DETECTED Final   Neisseria meningitidis NOT DETECTED NOT DETECTED Final   Pseudomonas aeruginosa NOT DETECTED NOT DETECTED Final   Candida albicans NOT DETECTED NOT DETECTED Final   Candida glabrata NOT DETECTED NOT DETECTED Final   Candida krusei NOT DETECTED NOT DETECTED Final   Candida parapsilosis NOT DETECTED NOT DETECTED Final   Candida tropicalis NOT DETECTED NOT DETECTED Final    Comment: Performed at [facility], [address], [place], [place] [address]  Culture, blood (routine x 2)     Status: Abnormal   Collection Time: 10/24/19  6:38 PM   Specimen: BLOOD RIGHT HAND  Result Value Ref Range Status   Specimen Description   Final    BLOOD RIGHT HAND Performed at [facility], [address] [place], [place] [address]    Special Requests   Final    BOTTLES DRAWN AEROBIC AND ANAEROBIC Blood Culture adequate volume Performed at [facility], [address], [place], [place] [address]    Culture  Setup Time   Final    GRAM POSITIVE COCCI ANAEROBIC BOTTLE ONLY Gram Stain Report Called to,Read Back By and Verified With: HYLTON @ 1238  ON [id number] BY HENDERSON L. CRITICAL VALUE NOTED.  VALUE IS CONSISTENT WITH PREVIOUSLY REPORTED AND CALLED VALUE.    Culture (A)  Final    STAPHYLOCOCCUS AUREUS SUSCEPTIBILITIES PERFORMED ON PREVIOUS CULTURE WITHIN THE LAST 5 DAYS. Performed at [facility], [address] [place], [place] [address]    Report Status 10/27/2019 FINAL  Final  Culture, blood (routine x 2)     Status: None (Preliminary result)   Collection Time: 10/25/19  2:38 PM   Specimen: Right Antecubital; Blood  Result Value Ref Range Status   Specimen Description   Final    RIGHT  ANTECUBITAL BOTTLES DRAWN AEROBIC AND ANAEROBIC   Special Requests Blood Culture adequate volume  Final   Culture   Final    NO GROWTH 3 DAYS Performed at [facility], [address] [place], [place] [address]    Report Status PENDING  Incomplete  Culture, blood (routine x 2)     Status: None (Preliminary result)   Collection Time: 10/26/19 11:17 AM   Specimen: BLOOD RIGHT ARM  Result Value Ref Range Status   Specimen Description BLOOD RIGHT ARM  Final   Special Requests   Final    BOTTLES DRAWN AEROBIC AND ANAEROBIC Blood Culture adequate volume   Culture   Final    NO GROWTH 2 DAYS Performed at [facility], [address] [place], [place] [address]    Report Status PENDING  Incomplete  Culture, blood (routine x 2)     Status: None (Preliminary result)   Collection Time: 10/26/19 11:17 AM   Specimen: BLOOD LEFT HAND  Result Value Ref Range Status   Specimen Description BLOOD LEFT HAND  Final   Special Requests   Final    BOTTLES DRAWN AEROBIC ONLY Blood Culture adequate volume   Culture   Final    NO GROWTH 2 DAYS Performed at [facility], [address] [place], [place] [address]    Report Status PENDING  Incomplete     No results found.  Medications Scheduled Meds: . amiodarone  200 mg Oral BID  . apixaban  10 mg Oral BID   Followed by  . [START ON 11/04/2019] apixaban  5 mg Oral BID  . Chlorhexidine Gluconate Cloth  6 each Topical Q0600  . diltiazem  120 mg Oral Daily  . insulin aspart  0-15 Units Subcutaneous TID WC  . insulin aspart  0-5 Units Subcutaneous QHS  . insulin glargine  30 Units Subcutaneous QHS  . sodium chloride flush  3 mL Intravenous Q12H   Continuous Infusions: . ampicillin-sulbactam (UNASYN) IV 3 g (10/28/19 0958)   PRN Meds:.acetaminophen **OR** acetaminophen, fentaNYL (SUBLIMAZE) injection, fluticasone, promethazine  Assessment 1.  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 2.  DVT, RLE   Plan Changed packing of R foot. Applied dressing to R foot. Stressed importance of wearing heel protective boot.  Continue Prevalon boot to RLE. Continue NWB RLE. Continue antibiotics per ID. DVT treatment per IM.  Brita Romp 10/28/2019, 12:46 PM

## 2019-10-28 NOTE — H&P (View-Only) (Signed)
PROGRESS NOTE  [name] RFF:[id number] DOB: 03/20/1966 DOA: 10/22/2019 PCP: Dettinger, [name], MD  Brief History: 53 year old male with a history of diabetes mellitus type 2, diabetic polyneuropathy, hypertension, left BKA, and right toe amputations secondary to diabetic foot infections presenting with 1 day history of increasing erythema, edema, and pain on his right foot. The patient stated that his podiatrist, Dr. [name] started him on 10 days of doxycycline on 10/10/2019 for his diabetic foot ulcer on his right fourth toe on 10/10/2019.The patient is a bit vague regarding the history of his most recent foot ulcer on his right fourth toe, but stated that he has noted some intermittent drainage in the past 1 to 2 days prior to admission. He had denied any fevers, chills, headache, neck pain, chest pain, shortness breath, cough, hemoptysis, abdominal pain, dysuria, hematuria. The patient did have episodes of nausea and vomiting for the past 3 days. He is also noted some loose stools without any hematochezia or melena. Because of his nausea, vomiting, and right foot erythema and pain he presented for further evaluation. In the ED, the patient had temperature 100.6 F. He was noted to have atrial fibrillation with RVR with heart rates into the 180s. The patient was hemodynamically stable with oxygen saturation 96-97% on room air. WBC was 12.1 with hemoglobin 16.0 and platelets 176K.Sodium was 125 with serum creatinine 1.42. Lactic acid peaked at 3.8. The patient was fluid resuscitated and started on vancomycin and Zosyn. Blood cultures from the emergency department were also obtained.    Assessment/Plan: Sepsis with organ dysfunction -Secondary to diabetic foot infection and bacteremia -Lactic acid peaked 3.8 -check PCT10.37>>>7.96>>>6.70 -Continue vancomycin>>>cefazolin>>>amp/sulbactam -Discontinue Zosyn -Continue IV fluids>>saline  lock -SARS-CoV2--neg -10/24/19 spike fever 3 hours post-op -sepsis physiology resolved  Diabetic foot infection/cellulitis right foot/Osteomyelitis toe -Podiatry, Dr. [name] has been consulted--appreciate followup -MRI foot--osteomyelitis of R-third toe with diffuse cellulitis and myositis -CRP 40.7 -JTT01 -10/24/19--Right third ray amputation -10/24/19--bone culture--OSSA and S. anginosus + possible anaerobe  Bacteremia--Staph aureus-OSSA -Source is likely foot infection -12/17 blood culture--OSSA -12/18blood cultures--OSSA -12/18 bone culture--OSSA and S. anginosus + Prevotella (Bacteroides) melanogenica -12/19 blood culture--neg -12/20 blood culture--neg -Discontinue vanco -Discontinue Zosyn -cefazolin>>>unasyn--plan 2 weeks beyond last negative blood culture -planning TEE  12/24 -PICC line on 12/24 if blood cultures remain negatie  Right popliteal and peroneal DVT -10/25/19 venous duplex +DVT -already on heparin IV>>>apixaban-->adjust dose for dvt dosing  Leukocytosis -WBC slow increase last 48 hours -?new DVT in LUE -remains afebrile and hemodynamically stable--no sepsis physiology -pt refused LUE ultrasound 12/22-->agrees to it if done bedside -if LUE ultrasound negative and WBC continues to climb-->MRI LUE -UA, urine culture  DKA, type 2 -pt presented with serum glucose 389 with anion gap 15 and ketonuria -resolved with aggressive fluid resuscitation -transition to [place] insulin  AKI -Resulting from sepsis -Baseline creatinine 0.7-1.0 -Serum creatinine peaked 1.42 -improved  Atrial fibrillation with RVR, type unspecified -CHADS-VASc = 3 (HTN, DM2, PAD) -ContinueIV heparin in anticipation for possible surgery on foot -Echo--EF 70-75%, no WMA, trace MR/TR, mild increase PASP -TSH--0.929 -switch to po amiodarone 12/20 -switch to po dilitazem 12/20 -appreciate cardiology followup  Uncontrolled diabetes mellitus type 2 with  hyperglycemia -Anticipate elevated CBGs secondary to sepsis -07/22/2019 hemoglobin A1c 12.4 -Increase Lantus to 30 units hs -NovoLog sliding scale -Holding metformin -12/17/20A1C--9.8  Hypokalemia/hypomagnesemia -Replete -Added potassium to IV fluids  Hyponatremia -Multifactorial including hyperglycemia, volume depletion, AKI -switch to IV NS today  Intractable nausea and vomiting -Suspect a component of gastritis/diabetic gastroparesis incited by sepsisand DKA -improved -advanced to carb modified diet  Diarrhea -C. difficile negative -If persistent, check stool pathogen panel  Essential hypertension -Blood pressure currently controlled on diltiazem drip -Holding lisinopril secondary to AKI initially and to allow BP margin for diltiazem titration  Hyperlipidemia -Continue statinwhen able to tolerate po  Left arm pain -venous duplex--pt refused 12/22 -now agrees to it if done bedside 12/23        Disposition Plan:Remain in ICU Family Communication:NoFamily at bedside  Consultants:Cardiology, podiatry  Code Status: FULL  DVT Prophylaxis:IVHeparin    Procedures: As Listed in Progress Note Above  Antibiotics: Unasyn 12/21>>> Cefazolin 12/20>>>12/21 vanco 12/16>>>12/20 Cefepime 12/18>>>12/20 Zosyn 12/16>>>12/17        Subjective: Patient denies fevers, chills, headache, chest pain, dyspnea, nausea, vomiting, diarrhea, abdominal pain, dysuria, hematuria, hematochezia, and melena. States pain in right leg is slowly improving.  No pain in left arm  Objective: Vitals:   10/27/19 1947 10/28/19 0500 10/28/19 0534 10/28/19 0749  BP: (!) 156/104  130/76   Pulse: 100  81   Resp: 17  16   Temp: 98.9 F (37.2 C)  98.7 F (37.1 C)   TempSrc: Oral  Oral   SpO2: 97%  97% 96%  Weight:  102 kg    Height:        Intake/Output Summary (Last 24 hours) at 10/28/2019 1640 Last data filed at 10/28/2019 1552 Gross per  24 hour  Intake 100 ml  Output 600 ml  Net -500 ml   Weight change: 0.8 kg Exam:   General:  Pt is alert, follows commands appropriately, not in acute distress  HEENT: No icterus, No thrush, No neck mass, [place]/AT  Cardiovascular: RRR, S1/S2, no rubs, no gallops  Respiratory: CTA bilaterally, no wheezing, no crackles, no rhonchi  Abdomen: Soft/+BS, non tender, non distended, no guarding  Extremities: right foot wrapped. +edema right calf without necrosis, crepitance--compartment is soft;  LUE edema above elbow without crepitance or necrosis or draining wound--compartment is soft   Data Reviewed: I have personally reviewed following labs and imaging studies Basic Metabolic Panel: Recent Labs  Lab 10/24/19 0357 10/25/19 0418 10/26/19 0444 10/27/19 0342 10/28/19 0353  NA 131* 132* 130* 131* 132*  K 3.8 3.9 3.5 3.4* 3.8  CL 96* 98 96* 93* 91*  CO2 _0 GLUCOSE 242* 211* 198* 160* 112*  BUN 25* _1 CREATININE 0.87 0.69 0.69 0.65 0.66  CALCIUM 8.2* 8.0* 7.8* 7.5* 7.5*  MG 1.8 1.8 1.6* 1.9 1.8   Liver Function Tests: Recent Labs  Lab 10/22/19 1520 10/23/19 0359 10/24/19 0357  AST _2 ALT _3 ALKPHOS 123 83 69  BILITOT 2.0* 1.6* 1.3*  PROT 7.5 6.0* 5.4*  ALBUMIN 3.4* 2.6* 2.1*   No results for input(s): LIPASE, AMYLASE in the last 168 hours. No results for input(s): AMMONIA in the last 168 hours. Coagulation Profile: Recent Labs  Lab 10/22/19 1520  INR 1.3*   CBC: Recent Labs  Lab 10/22/19 1520 10/23/19 0359 10/24/19 0357 10/25/19 0418 10/26/19 0444 10/27/19 0342 10/28/19 0353  WBC 12.1* 7.7 8.0 9.8 11.9* 16.0* 19.6*  NEUTROABS 10.2* 6.3  --   --   --   --   --   HGB 16.0 13.8 12.4* 12.0* 12.6* 12.7* 12.7*  HCT 46.1 39.0 36.0* 36.2* 37.5* 38.4* 38.7*  MCV 88.0 87.8 87.8 91.6 88.9  89.7 91.3  PLT 176 123* 126* 132* 185 256 308   Cardiac Enzymes: No results for input(s): CKTOTAL, CKMB, CKMBINDEX, TROPONINI in the last  168 hours. BNP: Invalid input(s): POCBNP CBG: Recent Labs  Lab 10/27/19 1645 10/27/19 2132 10/28/19 0726 10/28/19 1257 10/28/19 1625  GLUCAP 160* 153* 103* 173* 164*   HbA1C: No results for input(s): HGBA1C in the last 72 hours. Urine analysis:    Component Value Date/Time   COLORURINE YELLOW 10/22/2019 1508   APPEARANCEUR HAZY (A) 10/22/2019 1508   LABSPEC 1.017 10/22/2019 1508   PHURINE 5.0 10/22/2019 1508   GLUCOSEU >=500 (A) 10/22/2019 1508   HGBUR SMALL (A) 10/22/2019 1508   BILIRUBINUR NEGATIVE 10/22/2019 1508   BILIRUBINUR negative 03/26/2014 1212   KETONESUR 5 (A) 10/22/2019 1508   PROTEINUR NEGATIVE 10/22/2019 1508   UROBILINOGEN 0.2 06/13/2014 1048   NITRITE NEGATIVE 10/22/2019 1508   LEUKOCYTESUR NEGATIVE 10/22/2019 1508   Sepsis Labs: _0 (procalcitonin:4,lacticidven:4) ) Recent Results (from the past 240 hour(s))  Urine culture     Status: Abnormal   Collection Time: 10/22/19  3:08 PM   Specimen: In/Out Cath Urine  Result Value Ref Range Status   Specimen Description   Final    IN/OUT CATH URINE Performed at [facility], [address], [place], [place] [address]    Special Requests   Final    NONE Performed at [facility], [address] [place], [place] [address]    Culture (A)  Final    <10,000 COLONIES/mL INSIGNIFICANT GROWTH Performed at [facility], [place] [address] [place], [place] [address]    Report Status 10/23/2019 FINAL  Final  Blood Culture (routine x 2)     Status: Abnormal   Collection Time: 10/22/19  3:20 PM   Specimen: BLOOD LEFT WRIST  Result Value Ref Range Status   Specimen Description   Final    BLOOD LEFT WRIST Performed at [facility], [address] [place], [place] [address]    Special Requests   Final    BOTTLES DRAWN AEROBIC AND ANAEROBIC Blood Culture adequate volume Performed at [facility], [address], [place], [place] [address]    Culture  Setup Time   Final    GRAM POSITIVE COCCI BOTH  ANAEROBIC AND AEROBIC BOTTLES Gram Stain Report Called to,Read Back By and Verified With: HEARN,J_1  BY MATTHEWS, B 12.17.2020 [facility] Performed at [facility], [address], [place], [place] [address]    Culture STAPHYLOCOCCUS AUREUS (A)  Final   Report Status 10/26/2019 FINAL  Final   Organism ID, Bacteria STAPHYLOCOCCUS AUREUS  Final      Susceptibility   Staphylococcus aureus - MIC*    CIPROFLOXACIN >=8 RESISTANT Resistant     ERYTHROMYCIN >=8 RESISTANT Resistant     GENTAMICIN <=0.5 SENSITIVE Sensitive     OXACILLIN 0.5 SENSITIVE Sensitive     TETRACYCLINE >=16 RESISTANT Resistant     VANCOMYCIN 1 SENSITIVE Sensitive     TRIMETH/SULFA <=10 SENSITIVE Sensitive     CLINDAMYCIN RESISTANT Resistant     RIFAMPIN <=0.5 SENSITIVE Sensitive     Inducible Clindamycin POSITIVE Resistant     * STAPHYLOCOCCUS AUREUS  Blood Culture (routine x 2)     Status: Abnormal   Collection Time: 10/22/19  3:31 PM   Specimen: Right Antecubital; Blood  Result Value Ref Range Status   Specimen Description   Final    RIGHT ANTECUBITAL Performed at [facility], [address], [place], [place] [address]  Special Requests   Final    BOTTLES DRAWN AEROBIC AND ANAEROBIC Blood Culture adequate volume Performed at [facility], [address] [place], [place] [address]    Culture  Setup Time   Final    GRAM POSITIVE COCCI ANAEROBIC AND AEROBIC BOTTLES Gram Stain Report Called to,Read Back By and Verified With: HEARN,J_0  BY MATTHEWS B 12.17.2020 [facility] Performed at [facility], [address], [place], [place] [address]    Culture (A)  Final    STAPHYLOCOCCUS AUREUS SUSCEPTIBILITIES PERFORMED ON PREVIOUS CULTURE WITHIN THE LAST 5 DAYS. Performed at [facility], [place] [address], [place], [place] [address]    Report Status 10/26/2019 FINAL  Final  SARS CORONAVIRUS 2 (TAT 6-24 HRS) Nasopharyngeal Nasopharyngeal Swab     Status: None   Collection Time: 10/22/19  4:14 PM    Specimen: Nasopharyngeal Swab  Result Value Ref Range Status   SARS Coronavirus 2 NEGATIVE NEGATIVE Final    Comment: (NOTE) SARS-CoV-2 target nucleic acids are NOT DETECTED. The SARS-CoV-2 RNA is generally detectable in upper and lower respiratory specimens during the acute phase of infection. Negative results do not preclude SARS-CoV-2 infection, do not rule out co-infections with other pathogens, and should not be used as the sole basis for treatment or other patient management decisions. Negative results must be combined with clinical observations, patient history, and epidemiological information. The expected result is Negative. Fact Sheet for Patients: SugarRoll.be Fact Sheet for Healthcare Providers: https://www.woods-mathews.com/ This test is not yet approved or cleared by the [place] FDA and  has been authorized for detection and/or diagnosis of SARS-CoV-2 by FDA under an Emergency Use Authorization (EUA). This EUA will remain  in effect (meaning this test can be used) for the duration of the COVID-19 declaration under Section 56 4(b)(1) of the Act, 21 U.S.C. section 360bbb-3(b)(1), unless the authorization is terminated or revoked sooner. Performed at [facility], [place] [address] [place], [place] [address]   MRSA PCR Screening     Status: None   Collection Time: 10/22/19  8:11 PM   Specimen: Nasal Mucosa; Nasopharyngeal  Result Value Ref Range Status   MRSA by PCR NEGATIVE NEGATIVE Final    Comment:        The GeneXpert MRSA Assay (FDA approved for NASAL specimens only), is one component of a comprehensive MRSA colonization surveillance program. It is not intended to diagnose MRSA infection nor to guide or monitor treatment for MRSA infections. Performed at [facility], [address] [place], [place] [address]   C difficile quick scan w PCR reflex     Status: None   Collection Time: 10/22/19 11:56 PM    Specimen: STOOL  Result Value Ref Range Status   C Diff antigen NEGATIVE NEGATIVE Final   C Diff toxin NEGATIVE NEGATIVE Final   C Diff interpretation No C. difficile detected.  Final    Comment: Performed at [facility], [address], [place], [place] [address]  Culture, blood (routine x 2)     Status: None   Collection Time: 10/23/19  8:44 AM   Specimen: BLOOD RIGHT HAND  Result Value Ref Range Status   Specimen Description BLOOD RIGHT HAND  Final   Special Requests   Final    BOTTLES DRAWN AEROBIC AND ANAEROBIC Blood Culture adequate volume   Culture   Final    NO GROWTH 5 DAYS Performed at [facility], [address], [place],  [address]    Report  Status 10/28/2019 FINAL  Final  Culture, blood (routine x 2)     Status: None   Collection Time: 10/23/19  8:46 AM   Specimen: BLOOD RIGHT HAND  Result Value Ref Range Status   Specimen Description BLOOD RIGHT HAND  Final   Special Requests   Final    BOTTLES DRAWN AEROBIC AND ANAEROBIC Blood Culture results may not be optimal due to an excessive volume of blood received in culture bottles   Culture   Final    NO GROWTH 5 DAYS Performed at [facility], [address], [place], [place] [address]    Report Status 10/28/2019 FINAL  Final  Aerobic/Anaerobic Culture (surgical/deep wound)     Status: None   Collection Time: 10/24/19  2:20 PM   Specimen: Soft Tissue, Other  Result Value Ref Range Status   Specimen Description   Final    TOE SOFT TISSUE AND BONE OF 3RD Performed at [facility], [place] [address] [place], [place] [address]    Special Requests   Final    NONE Performed at [facility], [address] [place], [place] [address]    Gram Stain NO WBC SEEN FEW GRAM POSITIVE COCCI   Final   Culture   Final    MODERATE STAPHYLOCOCCUS AUREUS FEW STREPTOCOCCUS ANGINOSIS FEW PREVOTELLA MELANINOGENICA BETA LACTAMASE POSITIVE Performed at [facility], [place] [address], [place], [place] [address]     Report Status 10/28/2019 FINAL  Final   Organism ID, Bacteria STAPHYLOCOCCUS AUREUS  Final   Organism ID, Bacteria STREPTOCOCCUS ANGINOSIS  Final      Susceptibility   Staphylococcus aureus - MIC*    CIPROFLOXACIN >=8 RESISTANT Resistant     ERYTHROMYCIN >=8 RESISTANT Resistant     GENTAMICIN <=0.5 SENSITIVE Sensitive     OXACILLIN 0.5 SENSITIVE Sensitive     TETRACYCLINE >=16 RESISTANT Resistant     VANCOMYCIN <=0.5 SENSITIVE Sensitive     TRIMETH/SULFA <=10 SENSITIVE Sensitive     CLINDAMYCIN RESISTANT Resistant     RIFAMPIN <=0.5 SENSITIVE Sensitive     Inducible Clindamycin POSITIVE Resistant     * MODERATE STAPHYLOCOCCUS AUREUS   Streptococcus anginosis - MIC*    PENICILLIN <=0.06 SENSITIVE Sensitive     CEFTRIAXONE <=0.12 SENSITIVE Sensitive     ERYTHROMYCIN >=8 RESISTANT Resistant     LEVOFLOXACIN <=0.25 SENSITIVE Sensitive     VANCOMYCIN 0.5 SENSITIVE Sensitive     * FEW STREPTOCOCCUS ANGINOSIS  Culture, blood (routine x 2)     Status: Abnormal   Collection Time: 10/24/19  6:33 PM   Specimen: BLOOD RIGHT ARM  Result Value Ref Range Status   Specimen Description   Final    BLOOD RIGHT ARM Performed at [facility], [address] [place], [place] [address]    Special Requests   Final    BOTTLES DRAWN AEROBIC AND ANAEROBIC Blood Culture adequate volume Performed at [facility], [address], [place], [place] [address]    Culture  Setup Time   Final    IN BOTH AEROBIC AND ANAEROBIC BOTTLES CORRECTED ON 12/22 AT 0140: PREVIOUSLY REPORTED AS GRAM POSITIVE COCCI ANAEROBIC BOTTLE ONLY Gram Stain Report Called to,Read Back By and Verified With: [id number] ON [id number] BY HENDERSON L. CRITICAL RESULT CALLED TO, READ BACK BY AND VERIFIED WITH: E. MURPHY, RN ([facility]) AT 5638 ON 10/25/19 BY C. JESSUP, MT.    Culture (A)  Final    STAPHYLOCOCCUS AUREUS SUSCEPTIBILITIES PERFORMED ON PREVIOUS CULTURE  WITHIN THE LAST 5 DAYS. Performed at [facility], [place] [address],  [place], [place] [address]    Report Status 10/27/2019 FINAL  Final  Blood Culture ID Panel (Reflexed)     Status: Abnormal   Collection Time: 10/24/19  6:33 PM  Result Value Ref Range Status   Enterococcus species NOT DETECTED NOT DETECTED Final   Listeria monocytogenes NOT DETECTED NOT DETECTED Final   Staphylococcus species DETECTED (A) NOT DETECTED Final    Comment: CRITICAL RESULT CALLED TO, READ BACK BY AND VERIFIED WITH: E. MURPHY, RN (APH) AT 1820 ON 10/25/19 BY C. JESSUP, MT.    Staphylococcus aureus (BCID) DETECTED (A) NOT DETECTED Final    Comment: Methicillin (oxacillin) susceptible Staphylococcus aureus (MSSA). Preferred therapy is anti staphylococcal beta lactam antibiotic (Cefazolin or Nafcillin), unless clinically contraindicated. CRITICAL RESULT CALLED TO, READ BACK BY AND VERIFIED WITH: E. MURPHY, RN (APH) AT 5597 ON 10/25/19 BY C. JESSUP, MT.    Methicillin resistance NOT DETECTED NOT DETECTED Final   Streptococcus species NOT DETECTED NOT DETECTED Final   Streptococcus agalactiae NOT DETECTED NOT DETECTED Final   Streptococcus pneumoniae NOT DETECTED NOT DETECTED Final   Streptococcus pyogenes NOT DETECTED NOT DETECTED Final   Acinetobacter baumannii NOT DETECTED NOT DETECTED Final   Enterobacteriaceae species NOT DETECTED NOT DETECTED Final   Enterobacter cloacae complex NOT DETECTED NOT DETECTED Final   Escherichia coli NOT DETECTED NOT DETECTED Final   Klebsiella oxytoca NOT DETECTED NOT DETECTED Final   Klebsiella pneumoniae NOT DETECTED NOT DETECTED Final   Proteus species NOT DETECTED NOT DETECTED Final   Serratia marcescens NOT DETECTED NOT DETECTED Final   Haemophilus influenzae NOT DETECTED NOT DETECTED Final   Neisseria meningitidis NOT DETECTED NOT DETECTED Final   Pseudomonas aeruginosa NOT DETECTED NOT DETECTED Final   Candida albicans NOT DETECTED NOT DETECTED Final   Candida glabrata NOT DETECTED NOT DETECTED Final   Candida krusei NOT DETECTED NOT  DETECTED Final   Candida parapsilosis NOT DETECTED NOT DETECTED Final   Candida tropicalis NOT DETECTED NOT DETECTED Final    Comment: Performed at [facility], [place] [address], [place], [place] [address]  Culture, blood (routine x 2)     Status: Abnormal   Collection Time: 10/24/19  6:38 PM   Specimen: BLOOD RIGHT HAND  Result Value Ref Range Status   Specimen Description   Final    BLOOD RIGHT HAND Performed at [facility], [address], [place], [place] [address]    Special Requests   Final    BOTTLES DRAWN AEROBIC AND ANAEROBIC Blood Culture adequate volume Performed at [facility], [address], [place], [place] [address]    Culture  Setup Time   Final    GRAM POSITIVE COCCI ANAEROBIC BOTTLE ONLY Gram Stain Report Called to,Read Back By and Verified With: HYLTON @ 1224 ON [id number] BY HENDERSON L. CRITICAL VALUE NOTED.  VALUE IS CONSISTENT WITH PREVIOUSLY REPORTED AND CALLED VALUE.    Culture (A)  Final    STAPHYLOCOCCUS AUREUS SUSCEPTIBILITIES PERFORMED ON PREVIOUS CULTURE WITHIN THE LAST 5 DAYS. Performed at [facility], [place] [address], [place], [place] [address]    Report Status 10/27/2019 FINAL  Final  Culture, blood (routine x 2)     Status: None (Preliminary result)   Collection Time: 10/25/19  2:38 PM   Specimen: Right Antecubital; Blood  Result Value Ref Range Status   Specimen Description   Final    RIGHT ANTECUBITAL  BOTTLES DRAWN AEROBIC AND ANAEROBIC   Special Requests Blood Culture adequate volume  Final   Culture   Final    NO GROWTH 3 DAYS Performed at [facility], [address] [place], [place] [address]    Report Status PENDING  Incomplete  Culture, blood (routine x 2)     Status: None (Preliminary result)   Collection Time: 10/26/19 11:17 AM   Specimen: BLOOD RIGHT ARM  Result Value Ref Range Status   Specimen Description BLOOD RIGHT ARM  Final   Special Requests   Final    BOTTLES DRAWN AEROBIC AND ANAEROBIC Blood Culture adequate  volume   Culture   Final    NO GROWTH 2 DAYS Performed at [facility], [address] [place], [place] [address]    Report Status PENDING  Incomplete  Culture, blood (routine x 2)     Status: None (Preliminary result)   Collection Time: 10/26/19 11:17 AM   Specimen: BLOOD LEFT HAND  Result Value Ref Range Status   Specimen Description BLOOD LEFT HAND  Final   Special Requests   Final    BOTTLES DRAWN AEROBIC ONLY Blood Culture adequate volume   Culture   Final    NO GROWTH 2 DAYS Performed at [facility], [address], [place], [place] [address]    Report Status PENDING  Incomplete     Scheduled Meds: . amiodarone  200 mg Oral BID  . apixaban  10 mg Oral BID   Followed by  . [START ON 11/04/2019] apixaban  5 mg Oral BID  . Chlorhexidine Gluconate Cloth  6 each Topical Q0600  . diltiazem  120 mg Oral Daily  . insulin aspart  0-15 Units Subcutaneous TID WC  . insulin aspart  0-5 Units Subcutaneous QHS  . insulin glargine  30 Units Subcutaneous QHS  . sodium chloride flush  3 mL Intravenous Q12H   Continuous Infusions: . ampicillin-sulbactam (UNASYN) IV 3 g (10/28/19 0958)    Procedures/Studies: DG Tibia/Fibula Right  Result Date: 10/22/2019 CLINICAL DATA:  Right leg pain and tenderness. Fever. Clinical evidence of infection. Diabetes. EXAM: RIGHT TIBIA AND FIBULA - 2 VIEW COMPARISON:  Right ankle radiographs obtained at the same time. FINDINGS: Mild diffuse soft tissue swelling. Previously described chronic appearing periosteal reaction involving the distal tibia. On these radiographs, this is involving the medial and posterior aspects of the tibia. Also again noted are previously described bone collapse and fragmentation involving the talotibial joint and flattening of the normal plantar arch. No soft tissue gas is seen. Mild medial and lateral spur formation at the knee joint with mild to moderate medial joint space narrowing. IMPRESSION: 1. Chronic appearing periosteal  reaction involving the posterior and medial aspects of the distal tibia. Underlying chronic osteomyelitis is a possibility. 2. Diffuse soft tissue swelling without gas or bone destruction. 3. 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 Electronically Signed   By: Claudie Revering M.D.   On: 10/22/2019 16:05   DG Ankle Complete Right  Result Date: 10/22/2019 CLINICAL DATA:  Right leg pain and tenderness and fever with clinical evidence of infection. Diabetes. EXAM: RIGHT ANKLE - COMPLETE 3+ VIEW COMPARISON:  Right foot radiographs obtained today and on 06/19/2016. FINDINGS: Diffuse soft tissue swelling. Bone collapse and fragmentation at the talotibial joint, described on the foot radiographs, in addition to previously described flattening of the normal plantar arch. Again demonstrated are is chronic appearing posterior periosteal reaction involving the distal tibia. No bone destruction  or soft tissue gas seen. IMPRESSION: 1. Diffuse soft tissue swelling without underlying changes of acute osteomyelitis. 2. Neuropathic changes at the talotibial joint. 3. Pes planus. Electronically Signed   By: Claudie Revering M.D.   On: 10/22/2019 16:02   MR FOOT RIGHT W WO CONTRAST  Result Date: 10/23/2019 CLINICAL DATA:  Nonhealing ulcer involving the third toe. History of prior amputations. EXAM: MRI OF THE RIGHT FOREFOOT WITHOUT AND WITH CONTRAST TECHNIQUE: Multiplanar, multisequence MR imaging of the right foot was performed before and after the administration of intravenous contrast. CONTRAST:  49m GADAVIST GADOBUTROL 1 MMOL/ML IV SOLN COMPARISON:  Radiographs 10/22/2019 FINDINGS: Abnormal T1 and T2 signal intensity in the proximal phalanx of the third toe and also in what appears to be a small residual portion of the middle phalanx. There is also associated abnormal contrast enhancement and surrounding cellulitis. Findings consistent with osteomyelitis. No other definite sites of acute osteomyelitis are  identified. Evidence of prior amputations involving the first and fifth toes and parts of the other toes. The metatarsal bones are intact. No findings for septic arthritis involving the remaining MTP joints or involving the midfoot joint spaces. There is moderate edema like signal abnormality in the navicular bone along with some enhancement. Could not exclude osteomyelitis. Severe diffuse cellulitis involving the entire midfoot and forefoot. Mild diffuse myositis without findings for pyomyositis. IMPRESSION: 1. Osteomyelitis involving the third digit as detailed above. 2. Abnormal signal intensity and enhancement in the navicular bone. This is not completely covered but could not exclude osteomyelitis. 3. Severe diffuse cellulitis and mild diffuse myositis. Electronically Signed   By: PMarijo SanesM.D.   On: 10/23/2019 16:50   [place] Img Lower Unilateral Right (DVT)  Result Date: 10/25/2019 CLINICAL DATA:  Right calf pain and edema. EXAM: RIGHT LOWER EXTREMITY VENOUS DOPPLER ULTRASOUND TECHNIQUE: Gray-scale sonography with graded compression, as well as color Doppler and duplex ultrasound were performed to evaluate the lower extremity deep venous systems from the level of the common femoral vein and including the common femoral, femoral, profunda femoral, popliteal and calf veins including the posterior tibial, peroneal and gastrocnemius veins when visible. The superficial great saphenous vein was also interrogated. Spectral Doppler was utilized to evaluate flow at rest and with distal augmentation maneuvers in the common femoral, femoral and popliteal veins. COMPARISON:  None. FINDINGS: Contralateral Common Femoral Vein: Respiratory phasicity is normal and symmetric with the symptomatic side. No evidence of thrombus. Normal compressibility. Common Femoral Vein: No evidence of thrombus. Normal compressibility, respiratory phasicity and response to augmentation. Saphenofemoral Junction: No evidence of  thrombus. Normal compressibility and flow on color Doppler imaging. Profunda Femoral Vein: No evidence of thrombus. Normal compressibility and flow on color Doppler imaging. Femoral Vein: No evidence of thrombus. Normal compressibility, respiratory phasicity and response to augmentation. Popliteal Vein: Occlusive thrombus present in the right popliteal vein. Calf Veins: Posterior tibial vein normally patent. Visualized peroneal vein demonstrates some nonocclusive thrombus in the upper calf. Superficial Great Saphenous Vein: No evidence of thrombus. Normal compressibility. Venous Reflux:  None. Other Findings: No evidence of superficial thrombophlebitis or abnormal fluid collection. IMPRESSION: Positive for right lower extremity DVT with occlusive thrombus in the popliteal vein and nonocclusive thrombus in the peroneal vein. Electronically Signed   By: GAletta EdouardM.D.   On: 10/25/2019 14:21   DG Chest Port 1 View  Result Date: 10/22/2019 CLINICAL DATA:  Fever EXAM: PORTABLE CHEST 1 VIEW COMPARISON:  12/09/2013 chest radiograph. FINDINGS: Stable cardiomediastinal silhouette with top-normal  heart size. No pneumothorax. No pleural effusion. Lungs appear clear, with no acute consolidative airspace disease and no pulmonary edema. IMPRESSION: No active disease. Electronically Signed   By: Ilona Sorrel M.D.   On: 10/22/2019 15:55   DG Foot Complete Right  Result Date: 10/24/2019 CLINICAL DATA:  Patient status post amputation of the right third metatarsal head and toe today. EXAM: RIGHT FOOT COMPLETE - 3+ VIEW COMPARISON:  Plain films the right foot 10/22/2019. FINDINGS: The right third toe and head and neck of the third metatarsal have been amputated. Surgical wound with packing noted. The patient is status post prior amputation of the great toe and distal phalanx of the second toe. 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 No acute abnormality.  Electronically Signed   By: Inge Rise M.D.   On: 10/24/2019 15:33   DG Foot Complete Right  Result Date: 10/22/2019 CLINICAL DATA:  Right foot pain and fever. Diabetes. Clinical infection. EXAM: RIGHT FOOT COMPLETE - 3+ VIEW COMPARISON:  06/19/2016. FINDINGS: Diffuse soft tissue swelling. Again demonstrated surgical absence of the right great toe with interval surgical absence of the right little toe. Well-defined bone margins. No periosteal reaction or bone destruction involving the bones of the foot. There is flattening of the normal plantar arch and interval partial bone collapse and fragmentation involving the distal tibia and talus. There is also chronic appearing periosteal thickening and irregularity involving the distal tibia posteriorly. IMPRESSION: 1. Diffuse soft tissue swelling without soft tissue gas or evidence of underlying osteomyelitis. 2. Pes planus. 3. 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 Electronically Signed   By: Claudie Revering M.D.   On: 10/22/2019 15:59   ECHOCARDIOGRAM COMPLETE  Result Date: 10/23/2019   ECHOCARDIOGRAM REPORT   Patient Name:   [name] Date of Exam: 10/23/2019 Medical Rec #:  [id number]     Height:       76.0 in Accession #:    [id number]    Weight:       206.8 lb Date of Birth:  1966/10/10    BSA:          2.25 m Patient Age:    1 years      BP:           99/54 mmHg Patient Gender: M             HR:           120 bpm. Exam Location:  [facility] Procedure: 2D Echo, Cardiac Doppler and Color Doppler Indications:    Atrial Fibrillation 427.31 / I48.91  History:        Patient has no prior history of Echocardiogram examinations.                 Arrythmias:Atrial Fibrillation; Risk Factors:Diabetes and                 Hypertension.  Sonographer:    Alvino Chapel RCS Referring Phys: [phone] DAVID TAT IMPRESSIONS  1. Left ventricular ejection fraction, by visual estimation, is 70 to 75%. The left ventricle has  hyperdynamic function. There is mildly increased left ventricular hypertrophy.  2. Left ventricular diastolic parameters are indeterminate.  3. The left ventricle has no regional wall motion abnormalities.  4. Global right ventricle has normal systolic function.The right ventricular size is normal. No increase in right ventricular wall thickness.  5. Left atrial size was normal.  6. Right atrial size was normal.  7. The mitral valve is grossly normal. Trivial mitral valve regurgitation.  8. The tricuspid valve is grossly normal. Tricuspid valve regurgitation is trivial.  9. The aortic valve is tricuspid. Aortic valve regurgitation is not visualized. 10. The pulmonic valve was grossly normal. Pulmonic valve regurgitation is not visualized. 11. Mildly elevated pulmonary artery systolic pressure. 12. The tricuspid regurgitant velocity is 3.04 m/s, and with an assumed right atrial pressure of 3 mmHg, the estimated right ventricular systolic pressure is mildly elevated at 40.0 mmHg. 13. The inferior vena cava is normal in size with greater than 50% respiratory variability, suggesting right atrial pressure of 3 mmHg. FINDINGS  Left Ventricle: Left ventricular ejection fraction, by visual estimation, is 70 to 75%. The left ventricle has hyperdynamic function. The left ventricle has no regional wall motion abnormalities. The left ventricular internal cavity size was the left ventricle is normal in size. There is mildly increased left ventricular hypertrophy. Septal left ventricular hypertrophy. Left ventricular diastolic parameters are indeterminate. Right Ventricle: The right ventricular size is normal. No increase in right ventricular wall thickness. Global RV systolic function is has normal systolic function. The tricuspid regurgitant velocity is 3.04 m/s, and with an assumed right atrial pressure  of 3 mmHg, the estimated right ventricular systolic pressure is mildly elevated at 40.0 mmHg. Left Atrium: Left atrial size  was normal in size. Right Atrium: Right atrial size was normal in size Pericardium: There is no evidence of pericardial effusion. Mitral Valve: The mitral valve is grossly normal. Trivial mitral valve regurgitation. Tricuspid Valve: The tricuspid valve is grossly normal. Tricuspid valve regurgitation is trivial. Aortic Valve: The aortic valve is tricuspid. Aortic valve regurgitation is not visualized. Pulmonic Valve: The pulmonic valve was grossly normal. Pulmonic valve regurgitation is not visualized. Pulmonic regurgitation is not visualized. Aorta: The aortic root is normal in size and structure. Venous: The inferior vena cava is normal in size with greater than 50% respiratory variability, suggesting right atrial pressure of 3 mmHg. IAS/Shunts: No atrial level shunt detected by color flow Doppler.  LEFT VENTRICLE PLAX 2D LVIDd:         4.44 cm LVIDs:         2.56 cm LV PW:         1.00 cm LV IVS:        1.26 cm LVOT diam:     2.00 cm LV SV:         66 ml LV SV Index:   29.33 LVOT Area:     3.14 cm  RIGHT VENTRICLE TAPSE (M-mode): 1.7 cm LEFT ATRIUM             Index       RIGHT ATRIUM           Index LA diam:        3.30 cm 1.47 cm/m  RA Area:     20.60 cm LA Vol (A2C):   52.0 ml 23.14 ml/m RA Volume:   57.20 ml  25.46 ml/m LA Vol (A4C):   40.9 ml 18.20 ml/m LA Biplane Vol: 47.2 ml 21.01 ml/m  AORTIC VALVE LVOT Vmax:   129.00 cm/s LVOT Vmean:  82.933 cm/s LVOT VTI:    0.185 m  AORTA Ao Root diam: 3.30 cm MITRAL VALVE                        TRICUSPID VALVE MV Area (PHT): 3.21 cm  TR Peak grad:   37.0 mmHg MV PHT:        68.44 msec           TR Vmax:        304.00 cm/s MV Decel Time: 236 msec MV E velocity: 107.00 cm/s 103 cm/s SHUNTS                                     Systemic VTI:  0.19 m                                     Systemic Diam: 2.00 cm  Rozann Lesches MD Electronically signed by Rozann Lesches MD Signature Date/Time: 10/23/2019/9:39:01 AM    Final     Orson Eva, DO  [facility] Pager [phone]  If 7PM-7AM, please contact night-coverage [email or web address] Password [email or web address] 10/28/2019, 4:40 PM   LOS: 6 days

## 2019-10-28 NOTE — Progress Notes (Signed)
PROGRESS NOTE  [name] MVE:[id number] DOB: 11/06/1966 DOA: 10/22/2019 PCP: Dettinger, [name], MD  Brief History: 53 year old male with a history of diabetes mellitus type 2, diabetic polyneuropathy, hypertension, left BKA, and right toe amputations secondary to diabetic foot infections presenting with 1 day history of increasing erythema, edema, and pain on his right foot. The patient stated that his podiatrist, Dr. [name] started him on 10 days of doxycycline on 10/10/2019 for his diabetic foot ulcer on his right fourth toe on 10/10/2019.The patient is a bit vague regarding the history of his most recent foot ulcer on his right fourth toe, but stated that he has noted some intermittent drainage in the past 1 to 2 days prior to admission. He had denied any fevers, chills, headache, neck pain, chest pain, shortness breath, cough, hemoptysis, abdominal pain, dysuria, hematuria. The patient did have episodes of nausea and vomiting for the past 3 days. He is also noted some loose stools without any hematochezia or melena. Because of his nausea, vomiting, and right foot erythema and pain he presented for further evaluation. In the ED, the patient had temperature 100.6 F. He was noted to have atrial fibrillation with RVR with heart rates into the 180s. The patient was hemodynamically stable with oxygen saturation 96-97% on room air. WBC was 12.1 with hemoglobin 16.0 and platelets 176K.Sodium was 125 with serum creatinine 1.42. Lactic acid peaked at 3.8. The patient was fluid resuscitated and started on vancomycin and Zosyn. Blood cultures from the emergency department were also obtained.    Assessment/Plan: Sepsis with organ dysfunction -Secondary to diabetic foot infection and bacteremia -Lactic acid peaked 3.8 -check PCT10.37>>>7.96>>>6.70 -Continue vancomycin>>>cefazolin>>>amp/sulbactam -Discontinue Zosyn -Continue IV fluids>>saline  lock -SARS-CoV2--neg -10/24/19 spike fever 3 hours post-op -sepsis physiology resolved  Diabetic foot infection/cellulitis right foot/Osteomyelitis toe -Podiatry, Dr. [name] has been consulted--appreciate followup -MRI foot--osteomyelitis of R-third toe with diffuse cellulitis and myositis -CRP 40.7 -[id number] -10/24/19--Right third ray amputation -10/24/19--bone culture--OSSA and S. anginosus + possible anaerobe  Bacteremia--Staph aureus-OSSA -Source is likely foot infection -12/17 blood culture--OSSA -12/18blood cultures--OSSA -12/18 bone culture--OSSA and S. anginosus + Prevotella (Bacteroides) melanogenica -12/19 blood culture--neg -12/20 blood culture--neg -Discontinue vanco -Discontinue Zosyn -cefazolin>>>unasyn--plan 2 weeks beyond last negative blood culture -planning TEE  12/24 -PICC line on 12/24 if blood cultures remain negatie  Right popliteal and peroneal DVT -10/25/19 venous duplex +DVT -already on heparin IV>>>apixaban-->adjust dose for dvt dosing  Leukocytosis -WBC slow increase last 48 hours -?new DVT in LUE -remains afebrile and hemodynamically stable--no sepsis physiology -pt refused LUE ultrasound 12/22-->agrees to it if done bedside -if LUE ultrasound negative and WBC continues to climb-->MRI LUE -UA, urine culture  DKA, type 2 -pt presented with serum glucose 389 with anion gap 15 and ketonuria -resolved with aggressive fluid resuscitation -transition to [place] insulin  AKI -Resulting from sepsis -Baseline creatinine 0.7-1.0 -Serum creatinine peaked 1.42 -improved  Atrial fibrillation with RVR, type unspecified -CHADS-VASc = 3 (HTN, DM2, PAD) -ContinueIV heparin in anticipation for possible surgery on foot -Echo--EF 70-75%, no WMA, trace MR/TR, mild increase PASP -TSH--0.929 -switch to po amiodarone 12/20 -switch to po dilitazem 12/20 -appreciate cardiology followup  Uncontrolled diabetes mellitus type 2 with  hyperglycemia -Anticipate elevated CBGs secondary to sepsis -07/22/2019 hemoglobin A1c 12.4 -Increase Lantus to 30 units hs -NovoLog sliding scale -Holding metformin -12/17/20A1C--9.8  Hypokalemia/hypomagnesemia -Replete -Added potassium to IV fluids  Hyponatremia -Multifactorial including hyperglycemia, volume depletion, AKI -switch to IV NS today  Intractable nausea and vomiting -Suspect a component of gastritis/diabetic gastroparesis incited by sepsisand DKA -improved -advanced to carb modified diet  Diarrhea -C. difficile negative -If persistent, check stool pathogen panel  Essential hypertension -Blood pressure currently controlled on diltiazem drip -Holding lisinopril secondary to AKI initially and to allow BP margin for diltiazem titration  Hyperlipidemia -Continue statinwhen able to tolerate po  Left arm pain -venous duplex--pt refused 12/22 -now agrees to it if done bedside 12/23        Disposition Plan:Remain in ICU Family Communication:NoFamily at bedside  Consultants:Cardiology, podiatry  Code Status: FULL  DVT Prophylaxis:IVHeparin    Procedures: As Listed in Progress Note Above  Antibiotics: Unasyn 12/21>>> Cefazolin 12/20>>>12/21 vanco 12/16>>>12/20 Cefepime 12/18>>>12/20 Zosyn 12/16>>>12/17        Subjective: Patient denies fevers, chills, headache, chest pain, dyspnea, nausea, vomiting, diarrhea, abdominal pain, dysuria, hematuria, hematochezia, and melena. States pain in right leg is slowly improving.  No pain in left arm  Objective: Vitals:   10/27/19 1947 10/28/19 0500 10/28/19 0534 10/28/19 0749  BP: (!) 156/104  130/76   Pulse: 100  81   Resp: 17  16   Temp: 98.9 F (37.2 C)  98.7 F (37.1 C)   TempSrc: Oral  Oral   SpO2: 97%  97% 96%  Weight:  102 kg    Height:        Intake/Output Summary (Last 24 hours) at 10/28/2019 1640 Last data filed at 10/28/2019 1552 Gross per  24 hour  Intake 100 ml  Output 600 ml  Net -500 ml   Weight change: 0.8 kg Exam:   General:  Pt is alert, follows commands appropriately, not in acute distress  HEENT: No icterus, No thrush, No neck mass, [place]/AT  Cardiovascular: RRR, S1/S2, no rubs, no gallops  Respiratory: CTA bilaterally, no wheezing, no crackles, no rhonchi  Abdomen: Soft/+BS, non tender, non distended, no guarding  Extremities: right foot wrapped. +edema right calf without necrosis, crepitance--compartment is soft;  LUE edema above elbow without crepitance or necrosis or draining wound--compartment is soft   Data Reviewed: I have personally reviewed following labs and imaging studies Basic Metabolic Panel: Recent Labs  Lab 10/24/19 0357 10/25/19 0418 10/26/19 0444 10/27/19 0342 10/28/19 0353  NA 131* 132* 130* 131* 132*  K 3.8 3.9 3.5 3.4* 3.8  CL 96* 98 96* 93* 91*  CO2 _0 GLUCOSE 242* 211* 198* 160* 112*  BUN 25* _1 CREATININE 0.87 0.69 0.69 0.65 0.66  CALCIUM 8.2* 8.0* 7.8* 7.5* 7.5*  MG 1.8 1.8 1.6* 1.9 1.8   Liver Function Tests: Recent Labs  Lab 10/22/19 1520 10/23/19 0359 10/24/19 0357  AST _2 ALT _3 ALKPHOS 123 83 69  BILITOT 2.0* 1.6* 1.3*  PROT 7.5 6.0* 5.4*  ALBUMIN 3.4* 2.6* 2.1*   No results for input(s): LIPASE, AMYLASE in the last 168 hours. No results for input(s): AMMONIA in the last 168 hours. Coagulation Profile: Recent Labs  Lab 10/22/19 1520  INR 1.3*   CBC: Recent Labs  Lab 10/22/19 1520 10/23/19 0359 10/24/19 0357 10/25/19 0418 10/26/19 0444 10/27/19 0342 10/28/19 0353  WBC 12.1* 7.7 8.0 9.8 11.9* 16.0* 19.6*  NEUTROABS 10.2* 6.3  --   --   --   --   --   HGB 16.0 13.8 12.4* 12.0* 12.6* 12.7* 12.7*  HCT 46.1 39.0 36.0* 36.2* 37.5* 38.4* 38.7*  MCV 88.0 87.8 87.8 91.6 88.9  89.7 91.3  PLT 176 123* 126* 132* 185 256 308   Cardiac Enzymes: No results for input(s): CKTOTAL, CKMB, CKMBINDEX, TROPONINI in the last  168 hours. BNP: Invalid input(s): POCBNP CBG: Recent Labs  Lab 10/27/19 1645 10/27/19 2132 10/28/19 0726 10/28/19 1257 10/28/19 1625  GLUCAP 160* 153* 103* 173* 164*   HbA1C: No results for input(s): HGBA1C in the last 72 hours. Urine analysis:    Component Value Date/Time   COLORURINE YELLOW 10/22/2019 1508   APPEARANCEUR HAZY (A) 10/22/2019 1508   LABSPEC 1.017 10/22/2019 1508   PHURINE 5.0 10/22/2019 1508   GLUCOSEU >=500 (A) 10/22/2019 1508   HGBUR SMALL (A) 10/22/2019 1508   BILIRUBINUR NEGATIVE 10/22/2019 1508   BILIRUBINUR negative 03/26/2014 1212   KETONESUR 5 (A) 10/22/2019 1508   PROTEINUR NEGATIVE 10/22/2019 1508   UROBILINOGEN 0.2 06/13/2014 1048   NITRITE NEGATIVE 10/22/2019 1508   LEUKOCYTESUR NEGATIVE 10/22/2019 1508   Sepsis Labs: _0 (procalcitonin:4,lacticidven:4) ) Recent Results (from the past 240 hour(s))  Urine culture     Status: Abnormal   Collection Time: 10/22/19  3:08 PM   Specimen: In/Out Cath Urine  Result Value Ref Range Status   Specimen Description   Final    IN/OUT CATH URINE Performed at [facility], [address] [place], [place] [address]    Special Requests   Final    NONE Performed at [facility], [address], [place], [place] [address]    Culture (A)  Final    <10,000 COLONIES/mL INSIGNIFICANT GROWTH Performed at [facility], [place] [address] [place], [place] [address]    Report Status 10/23/2019 FINAL  Final  Blood Culture (routine x 2)     Status: Abnormal   Collection Time: 10/22/19  3:20 PM   Specimen: BLOOD LEFT WRIST  Result Value Ref Range Status   Specimen Description   Final    BLOOD LEFT WRIST Performed at [facility], [address] [place], [place] [address]    Special Requests   Final    BOTTLES DRAWN AEROBIC AND ANAEROBIC Blood Culture adequate volume Performed at [facility], [address], [place], [place] [address]    Culture  Setup Time   Final    GRAM POSITIVE COCCI BOTH  ANAEROBIC AND AEROBIC BOTTLES Gram Stain Report Called to,Read Back By and Verified With: HEARN,J_1  BY MATTHEWS, B 12.17.2020 [facility] Performed at [facility], [address] [place], [place] [address]    Culture STAPHYLOCOCCUS AUREUS (A)  Final   Report Status 10/26/2019 FINAL  Final   Organism ID, Bacteria STAPHYLOCOCCUS AUREUS  Final      Susceptibility   Staphylococcus aureus - MIC*    CIPROFLOXACIN >=8 RESISTANT Resistant     ERYTHROMYCIN >=8 RESISTANT Resistant     GENTAMICIN <=0.5 SENSITIVE Sensitive     OXACILLIN 0.5 SENSITIVE Sensitive     TETRACYCLINE >=16 RESISTANT Resistant     VANCOMYCIN 1 SENSITIVE Sensitive     TRIMETH/SULFA <=10 SENSITIVE Sensitive     CLINDAMYCIN RESISTANT Resistant     RIFAMPIN <=0.5 SENSITIVE Sensitive     Inducible Clindamycin POSITIVE Resistant     * STAPHYLOCOCCUS AUREUS  Blood Culture (routine x 2)     Status: Abnormal   Collection Time: 10/22/19  3:31 PM   Specimen: Right Antecubital; Blood  Result Value Ref Range Status   Specimen Description   Final    RIGHT ANTECUBITAL Performed at [facility], [address], [place] [address]  Special Requests   Final    BOTTLES DRAWN AEROBIC AND ANAEROBIC Blood Culture adequate volume Performed at [facility], [address] [place], [place] [address]    Culture  Setup Time   Final    GRAM POSITIVE COCCI ANAEROBIC AND AEROBIC BOTTLES Gram Stain Report Called to,Read Back By and Verified With: HEARN,J_0  BY MATTHEWS B 12.17.2020 [facility] Performed at [facility], [address] [place], [place] [address]    Culture (A)  Final    STAPHYLOCOCCUS AUREUS SUSCEPTIBILITIES PERFORMED ON PREVIOUS CULTURE WITHIN THE LAST 5 DAYS. Performed at [facility], [place] [address], [place], [place] [address]    Report Status 10/26/2019 FINAL  Final  SARS CORONAVIRUS 2 (TAT 6-24 HRS) Nasopharyngeal Nasopharyngeal Swab     Status: None   Collection Time: 10/22/19  4:14 PM    Specimen: Nasopharyngeal Swab  Result Value Ref Range Status   SARS Coronavirus 2 NEGATIVE NEGATIVE Final    Comment: (NOTE) SARS-CoV-2 target nucleic acids are NOT DETECTED. The SARS-CoV-2 RNA is generally detectable in upper and lower respiratory specimens during the acute phase of infection. Negative results do not preclude SARS-CoV-2 infection, do not rule out co-infections with other pathogens, and should not be used as the sole basis for treatment or other patient management decisions. Negative results must be combined with clinical observations, patient history, and epidemiological information. The expected result is Negative. Fact Sheet for Patients: SugarRoll.be Fact Sheet for Healthcare Providers: https://www.woods-mathews.com/ This test is not yet approved or cleared by the [place] FDA and  has been authorized for detection and/or diagnosis of SARS-CoV-2 by FDA under an Emergency Use Authorization (EUA). This EUA will remain  in effect (meaning this test can be used) for the duration of the COVID-19 declaration under Section 56 4(b)(1) of the Act, 21 U.S.C. section 360bbb-3(b)(1), unless the authorization is terminated or revoked sooner. Performed at [facility], [place] [address], [place], [place] [address]   MRSA PCR Screening     Status: None   Collection Time: 10/22/19  8:11 PM   Specimen: Nasal Mucosa; Nasopharyngeal  Result Value Ref Range Status   MRSA by PCR NEGATIVE NEGATIVE Final    Comment:        The GeneXpert MRSA Assay (FDA approved for NASAL specimens only), is one component of a comprehensive MRSA colonization surveillance program. It is not intended to diagnose MRSA infection nor to guide or monitor treatment for MRSA infections. Performed at [facility], [address] [place], [place] [address]   C difficile quick scan w PCR reflex     Status: None   Collection Time: 10/22/19 11:56 PM    Specimen: STOOL  Result Value Ref Range Status   C Diff antigen NEGATIVE NEGATIVE Final   C Diff toxin NEGATIVE NEGATIVE Final   C Diff interpretation No C. difficile detected.  Final    Comment: Performed at [facility], [address] [place], [place] [address]  Culture, blood (routine x 2)     Status: None   Collection Time: 10/23/19  8:44 AM   Specimen: BLOOD RIGHT HAND  Result Value Ref Range Status   Specimen Description BLOOD RIGHT HAND  Final   Special Requests   Final    BOTTLES DRAWN AEROBIC AND ANAEROBIC Blood Culture adequate volume   Culture   Final    NO GROWTH 5 DAYS Performed at [facility], [address], [place], [place] [address]    Report  Status 10/28/2019 FINAL  Final  Culture, blood (routine x 2)     Status: None   Collection Time: 10/23/19  8:46 AM   Specimen: BLOOD RIGHT HAND  Result Value Ref Range Status   Specimen Description BLOOD RIGHT HAND  Final   Special Requests   Final    BOTTLES DRAWN AEROBIC AND ANAEROBIC Blood Culture results may not be optimal due to an excessive volume of blood received in culture bottles   Culture   Final    NO GROWTH 5 DAYS Performed at [facility], [address] [place], [place] [address]    Report Status 10/28/2019 FINAL  Final  Aerobic/Anaerobic Culture (surgical/deep wound)     Status: None   Collection Time: 10/24/19  2:20 PM   Specimen: Soft Tissue, Other  Result Value Ref Range Status   Specimen Description   Final    TOE SOFT TISSUE AND BONE OF 3RD Performed at [facility], [place] [address], [place], [place] [address]    Special Requests   Final    NONE Performed at [facility], [address], [place], [place] [address]    Gram Stain NO WBC SEEN FEW GRAM POSITIVE COCCI   Final   Culture   Final    MODERATE STAPHYLOCOCCUS AUREUS FEW STREPTOCOCCUS ANGINOSIS FEW PREVOTELLA MELANINOGENICA BETA LACTAMASE POSITIVE Performed at [facility], [place] [address], [place], [place] [address]     Report Status 10/28/2019 FINAL  Final   Organism ID, Bacteria STAPHYLOCOCCUS AUREUS  Final   Organism ID, Bacteria STREPTOCOCCUS ANGINOSIS  Final      Susceptibility   Staphylococcus aureus - MIC*    CIPROFLOXACIN >=8 RESISTANT Resistant     ERYTHROMYCIN >=8 RESISTANT Resistant     GENTAMICIN <=0.5 SENSITIVE Sensitive     OXACILLIN 0.5 SENSITIVE Sensitive     TETRACYCLINE >=16 RESISTANT Resistant     VANCOMYCIN <=0.5 SENSITIVE Sensitive     TRIMETH/SULFA <=10 SENSITIVE Sensitive     CLINDAMYCIN RESISTANT Resistant     RIFAMPIN <=0.5 SENSITIVE Sensitive     Inducible Clindamycin POSITIVE Resistant     * MODERATE STAPHYLOCOCCUS AUREUS   Streptococcus anginosis - MIC*    PENICILLIN <=0.06 SENSITIVE Sensitive     CEFTRIAXONE <=0.12 SENSITIVE Sensitive     ERYTHROMYCIN >=8 RESISTANT Resistant     LEVOFLOXACIN <=0.25 SENSITIVE Sensitive     VANCOMYCIN 0.5 SENSITIVE Sensitive     * FEW STREPTOCOCCUS ANGINOSIS  Culture, blood (routine x 2)     Status: Abnormal   Collection Time: 10/24/19  6:33 PM   Specimen: BLOOD RIGHT ARM  Result Value Ref Range Status   Specimen Description   Final    BLOOD RIGHT ARM Performed at [facility], [address], [place], [place] [address]    Special Requests   Final    BOTTLES DRAWN AEROBIC AND ANAEROBIC Blood Culture adequate volume Performed at [facility], [address] [place], [place] [address]    Culture  Setup Time   Final    IN BOTH AEROBIC AND ANAEROBIC BOTTLES CORRECTED ON 12/22 AT 0140: PREVIOUSLY REPORTED AS GRAM POSITIVE COCCI ANAEROBIC BOTTLE ONLY Gram Stain Report Called to,Read Back By and Verified With: JJHERD [id number] ON [id number] BY HENDERSON L. CRITICAL RESULT CALLED TO, READ BACK BY AND VERIFIED WITH: E. MURPHY, RN (APH) AT 1497 ON 10/25/19 BY C. JESSUP, MT.    Culture (A)  Final    STAPHYLOCOCCUS AUREUS SUSCEPTIBILITIES PERFORMED ON PREVIOUS CULTURE  WITHIN THE LAST 5 DAYS. Performed at [facility], [place] [address]  [place], [place] [address]    Report Status 10/27/2019 FINAL  Final  Blood Culture ID Panel (Reflexed)     Status: Abnormal   Collection Time: 10/24/19  6:33 PM  Result Value Ref Range Status   Enterococcus species NOT DETECTED NOT DETECTED Final   Listeria monocytogenes NOT DETECTED NOT DETECTED Final   Staphylococcus species DETECTED (A) NOT DETECTED Final    Comment: CRITICAL RESULT CALLED TO, READ BACK BY AND VERIFIED WITH: E. MURPHY, RN (APH) AT 1820 ON 10/25/19 BY C. JESSUP, MT.    Staphylococcus aureus (BCID) DETECTED (A) NOT DETECTED Final    Comment: Methicillin (oxacillin) susceptible Staphylococcus aureus (MSSA). Preferred therapy is anti staphylococcal beta lactam antibiotic (Cefazolin or Nafcillin), unless clinically contraindicated. CRITICAL RESULT CALLED TO, READ BACK BY AND VERIFIED WITH: E. MURPHY, RN (APH) AT 7412 ON 10/25/19 BY C. JESSUP, MT.    Methicillin resistance NOT DETECTED NOT DETECTED Final   Streptococcus species NOT DETECTED NOT DETECTED Final   Streptococcus agalactiae NOT DETECTED NOT DETECTED Final   Streptococcus pneumoniae NOT DETECTED NOT DETECTED Final   Streptococcus pyogenes NOT DETECTED NOT DETECTED Final   Acinetobacter baumannii NOT DETECTED NOT DETECTED Final   Enterobacteriaceae species NOT DETECTED NOT DETECTED Final   Enterobacter cloacae complex NOT DETECTED NOT DETECTED Final   Escherichia coli NOT DETECTED NOT DETECTED Final   Klebsiella oxytoca NOT DETECTED NOT DETECTED Final   Klebsiella pneumoniae NOT DETECTED NOT DETECTED Final   Proteus species NOT DETECTED NOT DETECTED Final   Serratia marcescens NOT DETECTED NOT DETECTED Final   Haemophilus influenzae NOT DETECTED NOT DETECTED Final   Neisseria meningitidis NOT DETECTED NOT DETECTED Final   Pseudomonas aeruginosa NOT DETECTED NOT DETECTED Final   Candida albicans NOT DETECTED NOT DETECTED Final   Candida glabrata NOT DETECTED NOT DETECTED Final   Candida krusei NOT DETECTED NOT  DETECTED Final   Candida parapsilosis NOT DETECTED NOT DETECTED Final   Candida tropicalis NOT DETECTED NOT DETECTED Final    Comment: Performed at [facility], [place] [address], [place], [place] [address]  Culture, blood (routine x 2)     Status: Abnormal   Collection Time: 10/24/19  6:38 PM   Specimen: BLOOD RIGHT HAND  Result Value Ref Range Status   Specimen Description   Final    BLOOD RIGHT HAND Performed at [facility], [address], [place], [place] [address]    Special Requests   Final    BOTTLES DRAWN AEROBIC AND ANAEROBIC Blood Culture adequate volume Performed at [facility], [address] [place], [place] [address]    Culture  Setup Time   Final    GRAM POSITIVE COCCI ANAEROBIC BOTTLE ONLY Gram Stain Report Called to,Read Back By and Verified With: HYLTON @ 2836 ON [id number] BY HENDERSON L. CRITICAL VALUE NOTED.  VALUE IS CONSISTENT WITH PREVIOUSLY REPORTED AND CALLED VALUE.    Culture (A)  Final    STAPHYLOCOCCUS AUREUS SUSCEPTIBILITIES PERFORMED ON PREVIOUS CULTURE WITHIN THE LAST 5 DAYS. Performed at [facility], [place] [address], [place], [place] [address]    Report Status 10/27/2019 FINAL  Final  Culture, blood (routine x 2)     Status: None (Preliminary result)   Collection Time: 10/25/19  2:38 PM   Specimen: Right Antecubital; Blood  Result Value Ref Range Status   Specimen Description   Final    RIGHT ANTECUBITAL  BOTTLES DRAWN AEROBIC AND ANAEROBIC   Special Requests Blood Culture adequate volume  Final   Culture   Final    NO GROWTH 3 DAYS Performed at [facility], [address] [place], [place] [address]    Report Status PENDING  Incomplete  Culture, blood (routine x 2)     Status: None (Preliminary result)   Collection Time: 10/26/19 11:17 AM   Specimen: BLOOD RIGHT ARM  Result Value Ref Range Status   Specimen Description BLOOD RIGHT ARM  Final   Special Requests   Final    BOTTLES DRAWN AEROBIC AND ANAEROBIC Blood Culture adequate  volume   Culture   Final    NO GROWTH 2 DAYS Performed at [facility], [address] [place], [place] [address]    Report Status PENDING  Incomplete  Culture, blood (routine x 2)     Status: None (Preliminary result)   Collection Time: 10/26/19 11:17 AM   Specimen: BLOOD LEFT HAND  Result Value Ref Range Status   Specimen Description BLOOD LEFT HAND  Final   Special Requests   Final    BOTTLES DRAWN AEROBIC ONLY Blood Culture adequate volume   Culture   Final    NO GROWTH 2 DAYS Performed at [facility], [address], [place],  [address]    Report Status PENDING  Incomplete     Scheduled Meds:  amiodarone  200 mg Oral BID   apixaban  10 mg Oral BID   Followed by   Derrill Memo ON 11/04/2019] apixaban  5 mg Oral BID   Chlorhexidine Gluconate Cloth  6 each Topical Q0600   diltiazem  120 mg Oral Daily   insulin aspart  0-15 Units Subcutaneous TID WC   insulin aspart  0-5 Units Subcutaneous QHS   insulin glargine  30 Units Subcutaneous QHS   sodium chloride flush  3 mL Intravenous Q12H   Continuous Infusions:  ampicillin-sulbactam (UNASYN) IV 3 g (10/28/19 0958)    Procedures/Studies: DG Tibia/Fibula Right  Result Date: 10/22/2019 CLINICAL DATA:  Right leg pain and tenderness. Fever. Clinical evidence of infection. Diabetes. EXAM: RIGHT TIBIA AND FIBULA - 2 VIEW COMPARISON:  Right ankle radiographs obtained at the same time. FINDINGS: Mild diffuse soft tissue swelling. Previously described chronic appearing periosteal reaction involving the distal tibia. On these radiographs, this is involving the medial and posterior aspects of the tibia. Also again noted are previously described bone collapse and fragmentation involving the talotibial joint and flattening of the normal plantar arch. No soft tissue gas is seen. Mild medial and lateral spur formation at the knee joint with mild to moderate medial joint space narrowing. IMPRESSION: 1. Chronic appearing periosteal  reaction involving the posterior and medial aspects of the distal tibia. Underlying chronic osteomyelitis is a possibility. 2. Diffuse soft tissue swelling without gas or bone destruction. 3. 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 Electronically Signed   By: Claudie Revering M.D.   On: 10/22/2019 16:05   DG Ankle Complete Right  Result Date: 10/22/2019 CLINICAL DATA:  Right leg pain and tenderness and fever with clinical evidence of infection. Diabetes. EXAM: RIGHT ANKLE - COMPLETE 3+ VIEW COMPARISON:  Right foot radiographs obtained today and on 06/19/2016. FINDINGS: Diffuse soft tissue swelling. Bone collapse and fragmentation at the talotibial joint, described on the foot radiographs, in addition to previously described flattening of the normal plantar arch. Again demonstrated are is chronic appearing posterior periosteal reaction involving the distal tibia. No bone destruction  or soft tissue gas seen. IMPRESSION: 1. Diffuse soft tissue swelling without underlying changes of acute osteomyelitis. 2. Neuropathic changes at the talotibial joint. 3. Pes planus. Electronically Signed   By: Claudie Revering M.D.   On: 10/22/2019 16:02   MR FOOT RIGHT W WO CONTRAST  Result Date: 10/23/2019 CLINICAL DATA:  Nonhealing ulcer involving the third toe. History of prior amputations. EXAM: MRI OF THE RIGHT FOREFOOT WITHOUT AND WITH CONTRAST TECHNIQUE: Multiplanar, multisequence MR imaging of the right foot was performed before and after the administration of intravenous contrast. CONTRAST:  72m GADAVIST GADOBUTROL 1 MMOL/ML IV SOLN COMPARISON:  Radiographs 10/22/2019 FINDINGS: Abnormal T1 and T2 signal intensity in the proximal phalanx of the third toe and also in what appears to be a small residual portion of the middle phalanx. There is also associated abnormal contrast enhancement and surrounding cellulitis. Findings consistent with osteomyelitis. No other definite sites of acute osteomyelitis are  identified. Evidence of prior amputations involving the first and fifth toes and parts of the other toes. The metatarsal bones are intact. No findings for septic arthritis involving the remaining MTP joints or involving the midfoot joint spaces. There is moderate edema like signal abnormality in the navicular bone along with some enhancement. Could not exclude osteomyelitis. Severe diffuse cellulitis involving the entire midfoot and forefoot. Mild diffuse myositis without findings for pyomyositis. IMPRESSION: 1. Osteomyelitis involving the third digit as detailed above. 2. Abnormal signal intensity and enhancement in the navicular bone. This is not completely covered but could not exclude osteomyelitis. 3. Severe diffuse cellulitis and mild diffuse myositis. Electronically Signed   By: PMarijo SanesM.D.   On: 10/23/2019 16:50   UKoreaVenous Img Lower Unilateral Right (DVT)  Result Date: 10/25/2019 CLINICAL DATA:  Right calf pain and edema. EXAM: RIGHT LOWER EXTREMITY VENOUS DOPPLER ULTRASOUND TECHNIQUE: Gray-scale sonography with graded compression, as well as color Doppler and duplex ultrasound were performed to evaluate the lower extremity deep venous systems from the level of the common femoral vein and including the common femoral, femoral, profunda femoral, popliteal and calf veins including the posterior tibial, peroneal and gastrocnemius veins when visible. The superficial great saphenous vein was also interrogated. Spectral Doppler was utilized to evaluate flow at rest and with distal augmentation maneuvers in the common femoral, femoral and popliteal veins. COMPARISON:  None. FINDINGS: Contralateral Common Femoral Vein: Respiratory phasicity is normal and symmetric with the symptomatic side. No evidence of thrombus. Normal compressibility. Common Femoral Vein: No evidence of thrombus. Normal compressibility, respiratory phasicity and response to augmentation. Saphenofemoral Junction: No evidence of  thrombus. Normal compressibility and flow on color Doppler imaging. Profunda Femoral Vein: No evidence of thrombus. Normal compressibility and flow on color Doppler imaging. Femoral Vein: No evidence of thrombus. Normal compressibility, respiratory phasicity and response to augmentation. Popliteal Vein: Occlusive thrombus present in the right popliteal vein. Calf Veins: Posterior tibial vein normally patent. Visualized peroneal vein demonstrates some nonocclusive thrombus in the upper calf. Superficial Great Saphenous Vein: No evidence of thrombus. Normal compressibility. Venous Reflux:  None. Other Findings: No evidence of superficial thrombophlebitis or abnormal fluid collection. IMPRESSION: Positive for right lower extremity DVT with occlusive thrombus in the popliteal vein and nonocclusive thrombus in the peroneal vein. Electronically Signed   By: GAletta EdouardM.D.   On: 10/25/2019 14:21   DG Chest Port 1 View  Result Date: 10/22/2019 CLINICAL DATA:  Fever EXAM: PORTABLE CHEST 1 VIEW COMPARISON:  12/09/2013 chest radiograph. FINDINGS: Stable cardiomediastinal silhouette with top-normal  heart size. No pneumothorax. No pleural effusion. Lungs appear clear, with no acute consolidative airspace disease and no pulmonary edema. IMPRESSION: No active disease. Electronically Signed   By: Ilona Sorrel M.D.   On: 10/22/2019 15:55   DG Foot Complete Right  Result Date: 10/24/2019 CLINICAL DATA:  Patient status post amputation of the right third metatarsal head and toe today. EXAM: RIGHT FOOT COMPLETE - 3+ VIEW COMPARISON:  Plain films the right foot 10/22/2019. FINDINGS: The right third toe and head and neck of the third metatarsal have been amputated. Surgical wound with packing noted. The patient is status post prior amputation of the great toe and distal phalanx of the second toe. 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 No acute abnormality.  Electronically Signed   By: Inge Rise M.D.   On: 10/24/2019 15:33   DG Foot Complete Right  Result Date: 10/22/2019 CLINICAL DATA:  Right foot pain and fever. Diabetes. Clinical infection. EXAM: RIGHT FOOT COMPLETE - 3+ VIEW COMPARISON:  06/19/2016. FINDINGS: Diffuse soft tissue swelling. Again demonstrated surgical absence of the right great toe with interval surgical absence of the right little toe. Well-defined bone margins. No periosteal reaction or bone destruction involving the bones of the foot. There is flattening of the normal plantar arch and interval partial bone collapse and fragmentation involving the distal tibia and talus. There is also chronic appearing periosteal thickening and irregularity involving the distal tibia posteriorly. IMPRESSION: 1. Diffuse soft tissue swelling without soft tissue gas or evidence of underlying osteomyelitis. 2. Pes planus. 3. 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 Electronically Signed   By: Claudie Revering M.D.   On: 10/22/2019 15:59   ECHOCARDIOGRAM COMPLETE  Result Date: 10/23/2019   ECHOCARDIOGRAM REPORT   Patient Name:   KYSHAWN TEAL Date of Exam: 10/23/2019 Medical Rec #:  [id number]     Height:       76.0 in Accession #:    [id number]    Weight:       206.8 lb Date of Birth:  01-10-1966    BSA:          2.25 m Patient Age:    54 years      BP:           99/54 mmHg Patient Gender: M             HR:           120 bpm. Exam Location:  [facility] Procedure: 2D Echo, Cardiac Doppler and Color Doppler Indications:    Atrial Fibrillation 427.31 / I48.91  History:        Patient has no prior history of Echocardiogram examinations.                 Arrythmias:Atrial Fibrillation; Risk Factors:Diabetes and                 Hypertension.  Sonographer:    Alvino Chapel RCS Referring Phys: [phone] DAVID TAT IMPRESSIONS  1. Left ventricular ejection fraction, by visual estimation, is 70 to 75%. The left ventricle has  hyperdynamic function. There is mildly increased left ventricular hypertrophy.  2. Left ventricular diastolic parameters are indeterminate.  3. The left ventricle has no regional wall motion abnormalities.  4. Global right ventricle has normal systolic function.The right ventricular size is normal. No increase in right ventricular wall thickness.  5. Left atrial size was normal.  6. Right atrial size was normal.  7. The mitral valve is grossly normal. Trivial mitral valve regurgitation.  8. The tricuspid valve is grossly normal. Tricuspid valve regurgitation is trivial.  9. The aortic valve is tricuspid. Aortic valve regurgitation is not visualized. 10. The pulmonic valve was grossly normal. Pulmonic valve regurgitation is not visualized. 11. Mildly elevated pulmonary artery systolic pressure. 12. The tricuspid regurgitant velocity is 3.04 m/s, and with an assumed right atrial pressure of 3 mmHg, the estimated right ventricular systolic pressure is mildly elevated at 40.0 mmHg. 13. The inferior vena cava is normal in size with greater than 50% respiratory variability, suggesting right atrial pressure of 3 mmHg. FINDINGS  Left Ventricle: Left ventricular ejection fraction, by visual estimation, is 70 to 75%. The left ventricle has hyperdynamic function. The left ventricle has no regional wall motion abnormalities. The left ventricular internal cavity size was the left ventricle is normal in size. There is mildly increased left ventricular hypertrophy. Septal left ventricular hypertrophy. Left ventricular diastolic parameters are indeterminate. Right Ventricle: The right ventricular size is normal. No increase in right ventricular wall thickness. Global RV systolic function is has normal systolic function. The tricuspid regurgitant velocity is 3.04 m/s, and with an assumed right atrial pressure  of 3 mmHg, the estimated right ventricular systolic pressure is mildly elevated at 40.0 mmHg. Left Atrium: Left atrial size  was normal in size. Right Atrium: Right atrial size was normal in size Pericardium: There is no evidence of pericardial effusion. Mitral Valve: The mitral valve is grossly normal. Trivial mitral valve regurgitation. Tricuspid Valve: The tricuspid valve is grossly normal. Tricuspid valve regurgitation is trivial. Aortic Valve: The aortic valve is tricuspid. Aortic valve regurgitation is not visualized. Pulmonic Valve: The pulmonic valve was grossly normal. Pulmonic valve regurgitation is not visualized. Pulmonic regurgitation is not visualized. Aorta: The aortic root is normal in size and structure. Venous: The inferior vena cava is normal in size with greater than 50% respiratory variability, suggesting right atrial pressure of 3 mmHg. IAS/Shunts: No atrial level shunt detected by color flow Doppler.  LEFT VENTRICLE PLAX 2D LVIDd:         4.44 cm LVIDs:         2.56 cm LV PW:         1.00 cm LV IVS:        1.26 cm LVOT diam:     2.00 cm LV SV:         66 ml LV SV Index:   29.33 LVOT Area:     3.14 cm  RIGHT VENTRICLE TAPSE (M-mode): 1.7 cm LEFT ATRIUM             Index       RIGHT ATRIUM           Index LA diam:        3.30 cm 1.47 cm/m  RA Area:     20.60 cm LA Vol (A2C):   52.0 ml 23.14 ml/m RA Volume:   57.20 ml  25.46 ml/m LA Vol (A4C):   40.9 ml 18.20 ml/m LA Biplane Vol: 47.2 ml 21.01 ml/m  AORTIC VALVE LVOT Vmax:   129.00 cm/s LVOT Vmean:  82.933 cm/s LVOT VTI:    0.185 m  AORTA Ao Root diam: 3.30 cm MITRAL VALVE                        TRICUSPID VALVE MV Area (PHT): 3.21 cm  TR Peak grad:   37.0 mmHg MV PHT:        68.44 msec           TR Vmax:        304.00 cm/s MV Decel Time: 236 msec MV E velocity: 107.00 cm/s 103 cm/s SHUNTS                                     Systemic VTI:  0.19 m                                     Systemic Diam: 2.00 cm  Rozann Lesches MD Electronically signed by Rozann Lesches MD Signature Date/Time: 10/23/2019/9:39:01 AM    Final     Orson Eva, DO  [facility] Pager [phone]  If 7PM-7AM, please contact night-coverage [email or web address] Password [email or web address] 10/28/2019, 4:40 PM   LOS: 6 days

## 2019-10-28 NOTE — Progress Notes (Signed)
[facility] for Heparin--> apixaban Indication: atrial fibrillation/DVT  No Known Allergies  Patient Measurements: Height: 6\' 4"  (193 cm) Weight: 224 lb 13.9 oz (102 kg) IBW/kg (Calculated) : 86.8 Heparin Dosing Weight: 94 kg  Vital Signs: Temp: 98.7 F (37.1 C) (12/22 0534) Temp Source: Oral (12/22 0534) BP: 130/76 (12/22 0534) Pulse Rate: 81 (12/22 0534)  Labs: Recent Labs    10/25/19 2036 10/26/19 0444 10/27/19 0342 10/28/19 0353  HGB  --  12.6* 12.7* 12.7*  HCT  --  37.5* 38.4* 38.7*  PLT  --  185 256 308  HEPARINUNFRC <0.10* <0.10*  --   --   CREATININE  --  0.69 0.65 0.66    Estimated Creatinine Clearance: 128.1 mL/min (by C-G formula based on SCr of 0.66 mg/dL).   Medical History: Past Medical History:  Diagnosis Date  . Charcot ankle   . Hypertension   . Osteomyelitis of toe of right foot ([place])   . Recurrent boils   . Type 2 diabetes mellitus with diabetic neuropathy [facility])    Diagnosed age 51    Medications:  Medications Prior to Admission  Medication Sig Dispense Refill Last Dose  . albuterol (PROVENTIL HFA;VENTOLIN HFA) 108 (90 Base) MCG/ACT inhaler Inhale 2 puffs into the lungs every 6 (six) hours as needed for wheezing or shortness of breath. 1 Inhaler 0 Past Week at Unknown time  . aspirin (ASPIRIN EC) 81 MG EC tablet Take 81 mg by mouth daily. Swallow whole.   Past Week at Unknown time  . atorvastatin (LIPITOR) 20 MG tablet Take 1 tablet (20 mg total) by mouth daily. 90 tablet 3 Past Week at Unknown time  . cyclobenzaprine (FLEXERIL) 10 MG tablet Take 1 tablet (10 mg total) by mouth at bedtime as needed for muscle spasms. 30 tablet 3 Past Week at Unknown time  . doxycycline (VIBRA-TABS) 100 MG tablet Take 100 mg by mouth 2 (two) times daily. Started course on 10/10/2019   Past Week at Unknown time  . DULoxetine (CYMBALTA) 60 MG capsule Take 1 capsule (60 mg total) by mouth daily. 90 capsule 3 Past Week at  Unknown time  . fluticasone (FLONASE) 50 MCG/ACT nasal spray Place 1 spray into both nostrils 2 (two) times daily as needed for allergies or rhinitis. 16 g 6 Past Week at Unknown time  . gabapentin (NEURONTIN) 600 MG tablet Take 1 tablet (600 mg total) by mouth at bedtime. 90 tablet 3 Past Week at Unknown time  . HYDROcodone-acetaminophen (NORCO) 10-325 MG tablet Take 1 tablet by mouth 2 (two) times daily as needed. Do not refill until 60 days from prescription date 60 tablet 0 Past Week at Unknown time  . HYDROcodone-acetaminophen (NORCO) 10-325 MG tablet Take 1 tablet by mouth 2 (two) times daily as needed. Do not refill until 30 days from prescription date 60 tablet 0 Past Week at Unknown time  . HYDROcodone-acetaminophen (NORCO) 10-325 MG tablet Take 1 tablet by mouth 2 (two) times daily as needed for moderate pain. 60 tablet 0 Past Week at Unknown time  . ibuprofen (ADVIL,MOTRIN) 200 MG tablet Take 200 mg by mouth every 6 (six) hours as needed for mild pain or moderate pain.    Past Week at Unknown time  . Insulin Glargine, 2 Unit Dial, (TOUJEO MAX SOLOSTAR) 300 UNIT/ML SOPN Inject 40-50 Units into the skin daily. 5 pen 5 Past Week at Unknown time  . lisinopril (PRINIVIL,ZESTRIL) 5 MG tablet Take 1 tablet (5  mg total) by mouth daily. 90 tablet 3 Past Week at Unknown time  . metFORMIN (GLUCOPHAGE) 1000 MG tablet Take 1 tablet (1,000 mg total) by mouth 2 (two) times daily. 180 tablet 3 Past Week at Unknown time  . Multiple Vitamins-Minerals (MULTIVITAMIN WITH MINERALS) tablet Take 1 tablet by mouth daily.   Past Week at Unknown time  . terbinafine (LAMISIL AT) 1 % cream Apply 1 application topically 2 (two) times daily. 42 g 0 Past Week at Unknown time  . zinc gluconate 50 MG tablet Take 50 mg by mouth daily.   Past Week at Unknown time  . Continuous Blood Gluc Receiver (FREESTYLE LIBRE 14 DAY READER) DEVI 1 each by Does not apply route daily. (Patient not taking: Reported on 10/22/2019) 1 each 0 Not  Taking at Unknown time  . Continuous Blood Gluc Sensor (FREESTYLE LIBRE 14 DAY SENSOR) MISC 1 each by Other route every 14 (fourteen) days. (Patient not taking: Reported on 10/22/2019) 6 each 3 Not Taking at Unknown time    Assessment: Pharmacy consulted to transition heparin to apixaban in patient with atrial fibrillation and newly diagnosed DVT   Goal of Therapy:   Monitor platelets by anticoagulation protocol: Yes    Plan:  Apixaban 10 mg BID x 7 days followed by apixaban 5 mg twice daily. Continue to monitor H&H and platelets.  Judeth Cornfield, PharmD Clinical Pharmacist 10/28/2019 8:39 AM

## 2019-10-29 ENCOUNTER — Telehealth: Payer: Self-pay | Admitting: Internal Medicine

## 2019-10-29 ENCOUNTER — Inpatient Hospital Stay (HOSPITAL_COMMUNITY): Payer: Medicare Other

## 2019-10-29 ENCOUNTER — Inpatient Hospital Stay (HOSPITAL_COMMUNITY): Payer: Medicare Other | Admitting: Anesthesiology

## 2019-10-29 ENCOUNTER — Inpatient Hospital Stay: Payer: Self-pay

## 2019-10-29 ENCOUNTER — Encounter (HOSPITAL_COMMUNITY): Payer: Self-pay | Admitting: Internal Medicine

## 2019-10-29 ENCOUNTER — Encounter (HOSPITAL_COMMUNITY)
Admission: EM | Disposition: A | Discharge status: Home Health | Payer: Self-pay | Source: Home / Self Care | Attending: Internal Medicine

## 2019-10-29 DIAGNOSIS — R7881 Bacteremia: Secondary | ICD-10-CM

## 2019-10-29 HISTORY — PX: TEE WITHOUT CARDIOVERSION: SHX5443

## 2019-10-29 LAB — BASIC METABOLIC PANEL
Anion gap: 9 (ref 5–15)
BUN: 16 mg/dL (ref 6–20)
CO2: 29 mmol/L (ref 22–32)
Calcium: 7.5 mg/dL — ABNORMAL LOW (ref 8.9–10.3)
Chloride: 91 mmol/L — ABNORMAL LOW (ref 98–111)
Creatinine, Ser: 0.81 mg/dL (ref 0.61–1.24)
GFR calc Af Amer: >60 mL/min (ref >60)
GFR calc non Af Amer: >60 mL/min (ref >60)
Glucose, Bld: 184 mg/dL — ABNORMAL HIGH (ref 70–99)
Potassium: 4 mmol/L (ref 3.5–5.1)
Sodium: 129 mmol/L — ABNORMAL LOW (ref 135–145)

## 2019-10-29 LAB — GLUCOSE, CAPILLARY
Glucose-Capillary: 164 mg/dL — ABNORMAL HIGH (ref 70–99)
Glucose-Capillary: 109 mg/dL — ABNORMAL HIGH (ref 70–99)
Glucose-Capillary: 99 mg/dL (ref 70–99)
Glucose-Capillary: 136 mg/dL — ABNORMAL HIGH (ref 70–99)
Glucose-Capillary: 173 mg/dL — ABNORMAL HIGH (ref 70–99)

## 2019-10-29 LAB — CBC
HCT: 36.5 % — ABNORMAL LOW (ref 39.0–52.0)
Hemoglobin: 12.3 g/dL — ABNORMAL LOW (ref 13.0–17.0)
MCH: 30.5 pg (ref 26.0–34.0)
MCHC: 33.7 g/dL (ref 30.0–36.0)
MCV: 90.6 fL (ref 80.0–100.0)
Platelets: 295 10*3/uL (ref 150–400)
RBC: 4.03 MIL/uL — ABNORMAL LOW (ref 4.22–5.81)
RDW: 12.9 % (ref 11.5–15.5)
WBC: 18.7 10*3/uL — ABNORMAL HIGH (ref 4.0–10.5)
nRBC: 0 % (ref 0.0–0.2)

## 2019-10-29 SURGERY — ECHOCARDIOGRAM, TRANSESOPHAGEAL
Anesthesia: General

## 2019-10-29 MED ORDER — PROPOFOL 10 MG/ML IV BOLUS
INTRAVENOUS | Status: DC | PRN
  Administered 2019-10-29 (×2): 20 mg via INTRAVENOUS

## 2019-10-29 MED ORDER — PROPOFOL 500 MG/50ML IV EMUL
INTRAVENOUS | Status: DC | PRN
  Administered 2019-10-29: 150 ug/kg/min via INTRAVENOUS

## 2019-10-29 MED ORDER — KETAMINE HCL 10 MG/ML IJ SOLN
INTRAMUSCULAR | Status: DC | PRN
  Administered 2019-10-29: 20 mg via INTRAVENOUS

## 2019-10-29 MED ORDER — LACTATED RINGERS IV SOLN: INTRAVENOUS | Status: DC | PRN

## 2019-10-29 MED ORDER — KETAMINE HCL 50 MG/5ML IJ SOSY
PREFILLED_SYRINGE | INTRAMUSCULAR | Status: AC
  Filled 2019-10-29: qty 5

## 2019-10-29 MED ORDER — GLYCOPYRROLATE PF 0.2 MG/ML IJ SOSY
PREFILLED_SYRINGE | INTRAMUSCULAR | Status: AC
  Filled 2019-10-29: qty 1

## 2019-10-29 MED ORDER — PROPOFOL 10 MG/ML IV BOLUS
INTRAVENOUS | Status: AC
  Filled 2019-10-29: qty 20

## 2019-10-29 NOTE — Progress Notes (Signed)
Progress Note  Patient Name: [name] Date of Encounter: 10/29/2019  Primary Cardiologist: Rozann Lesches, MD   Subjective   NO CP   NO SOB    Inpatient Medications    Scheduled Meds: . [MAR Hold] amiodarone  200 mg Oral BID  . [MAR Hold] apixaban  10 mg Oral BID   Followed by  . [MAR Hold] apixaban  5 mg Oral BID  . [MAR Hold] Chlorhexidine Gluconate Cloth  6 each Topical Q0600  . [MAR Hold] diltiazem  120 mg Oral Daily  . [MAR Hold] insulin aspart  0-15 Units Subcutaneous TID WC  . [MAR Hold] insulin aspart  0-5 Units Subcutaneous QHS  . [MAR Hold] insulin glargine  30 Units Subcutaneous QHS  . [MAR Hold] sodium chloride flush  3 mL Intravenous Q12H   Continuous Infusions: . [MAR Hold] ampicillin-sulbactam (UNASYN) IV 3 g (10/29/19 0836)   PRN Meds: [MAR Hold] acetaminophen **OR** [MAR Hold] acetaminophen, [MAR Hold] fentaNYL (SUBLIMAZE) injection, [MAR Hold] fluticasone, [MAR Hold] promethazine   Vital Signs    Vitals:   10/29/19 0513 10/29/19 1100 10/29/19 1128 10/29/19 1148  BP: 136/77 138/72  128/76  Pulse: 85 70  79  Resp: 16 20  (!) 23  Temp: 99.4 F (37.4 C) 98.9 F (37.2 C) (P) 97.8 F (36.6 C) (P) 97.8 F (36.6 C)  TempSrc: Oral     SpO2: 96% 96%  97%  Weight:      Height:        Intake/Output Summary (Last 24 hours) at 10/29/2019 1201 Last data filed at 10/29/2019 1152 Gross per 24 hour  Intake 540 ml  Output 1200 ml  Net -660 ml    Last 3 Weights 10/29/2019 10/28/2019 10/27/2019  Weight (lbs) 223 lb 8.7 oz 224 lb 13.9 oz 224 lb 13.9 oz  Weight (kg) 101.4 kg 102 kg 102 kg      Telemetry    SR   Personally reviewed  ECG    No new tracings.   Physical Exam   General: Well developed, well nourished, male appearing in no acute distress. Head: Normocephalic, atraumatic.  Neck: JVD not elevated. Lungs:  Resp regular and unlabored, CTA without wheezing or rales. Heart: RRR, S1, S2, no S3, S4, or murmur; no rub. Abdomen: Soft,  non-tender, non-distended with normoactive bowel sounds Extremities: No clubbing, cyanosis. 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 Moves all extremities spontaneously. Psych: Normal affect.  Labs    Chemistry Recent Labs  Lab 10/22/19 1520 10/23/19 0359 10/24/19 0357 10/27/19 0342 10/28/19 0353 10/29/19 0600  NA 125* 132* 131* 131* 132* 129*  K 3.4* 3.3* 3.8 3.4* 3.8 4.0  CL 89* 98 96* 93* 91* 91*  CO2 21* 22 28 29 29 29   GLUCOSE 389* 266* 242* 160* 112* 184*  BUN 40* 31* 25* 14 16 16   CREATININE 1.42* 0.97 0.87 0.65 0.66 0.81  CALCIUM 8.8* 8.5* 8.2* 7.5* 7.5* 7.5*  PROT 7.5 6.0* 5.4*  --   --   --   ALBUMIN 3.4* 2.6* 2.1*  --   --   --   AST 28 25 22   --   --   --   ALT 30 28 27   --   --   --   ALKPHOS 123 83 69  --   --   --   BILITOT 2.0* 1.6* 1.3*  --   --   --  GFRNONAA 55* >60 >60 >60 >60 >60  GFRAA >60 >60 >60 >60 >60 >60  ANIONGAP 15 12 7 9 12 9      Hematology Recent Labs  Lab 10/27/19 0342 10/28/19 0353 10/29/19 0600  WBC 16.0* 19.6* 18.7*  RBC 4.28 4.24 4.03*  HGB 12.7* 12.7* 12.3*  HCT 38.4* 38.7* 36.5*  MCV 89.7 91.3 90.6  MCH 29.7 30.0 30.5  MCHC 33.1 32.8 33.7  RDW 12.6 13.0 12.9  PLT 256 308 295    Cardiac EnzymesNo results for input(s): TROPONINI in the last 168 hours. No results for input(s): TROPIPOC in the last 168 hours.   BNPNo results for input(s): BNP, PROBNP in the last 168 hours.   DDimer No results for input(s): DDIMER in the last 168 hours.   Radiology    No results found.  Cardiac Studies   Echocardiogram: 10/23/2019 IMPRESSIONS      1. Left ventricular ejection fraction, by visual estimation, is 70 to 75%. The left ventricle has hyperdynamic function. There is mildly increased left ventricular hypertrophy.  2. Left ventricular diastolic parameters are indeterminate.  3. The left ventricle has no regional wall motion abnormalities.  4. Global right ventricle has normal  systolic function.The right ventricular size is normal. No increase in right ventricular wall thickness.  5. Left atrial size was normal.  6. Right atrial size was normal.  7. The mitral valve is grossly normal. Trivial mitral valve regurgitation.  8. The tricuspid valve is grossly normal. Tricuspid valve regurgitation is trivial.  9. The aortic valve is tricuspid. Aortic valve regurgitation is not visualized. 10. The pulmonic valve was grossly normal. Pulmonic valve regurgitation is not visualized. 11. Mildly elevated pulmonary artery systolic pressure. 12. The tricuspid regurgitant velocity is 3.04 m/s, and with an assumed right atrial pressure of 3 mmHg, the estimated right ventricular systolic pressure is mildly elevated at 40.0 mmHg. 13. The inferior vena cava is normal in size with greater than 50% respiratory variability, suggesting right atrial pressure of 3 mmHg.  Patient Profile     53 y.o. male w/ PMH of Type 2 DM, HTN, HLD and Charcot foot who is currently admitted for sepsis in the setting of right foot infection and MSSA cellulitis with osteomyelitis. Cardiology consulted for pre-op clearance and new-onset atrial fibrillation with RVR.   Assessment & Plan    1  Atrial fibrillation   PT currently on amiodarone 200 bid, diltiazem 120 daily and Eliquis   COnverted to SR     WIll need to be followed as outpt     WOuld keep on current dosing for now.  WIll make sure he has outpt cardiology appt rel soon      2. MSSA Bacteremia with possible Anerobe/Osteomyelitis of Right 3rd Toe - COntinues on ABX   TEE without vegetation    3. AKI   4. Type 2 DM - Hgb A1c elevated to 9.8 this admission.   5. DVT  On ELiquis    WIll make sure appts made    SIgning off  Please call with questions   - new diagnosis for the patient this admission. ON Eliquis   For questions or updates, please contact [facility] Please consult [email or web address] for contact info under  Cardiology/STEMI.   53 , PA-C 12:01 PM 10/29/2019 Pager: [phone]  Patient examined chart reviewed. Exam with left BKA and infection right foot in boot. He has DVT on eliquis should be higher DVT dosing to start Telemetry review shows he  has converted to NSR from afib. Still needs TEE for bacteremia r/o SBE. PA has arranged for Dr Tenny Crawoss to do 12/23 at 11:30 am Procedure and risks discussed with patient and willing to proceed Orders written  Charlton HawsPeter Nishan MD [facility]

## 2019-10-29 NOTE — Transfer of Care (Signed)
Immediate Anesthesia Transfer of Care Note  Patient: [name]  Procedure(s) Performed: TRANSESOPHAGEAL ECHOCARDIOGRAM (TEE) WITH PROPOFOL (N/A )  Patient Location: PACU  Anesthesia Type:General  Level of Consciousness: awake  Airway & Oxygen Therapy: Patient Spontanous Breathing  Post-op Assessment: Report given to RN  Post vital signs: Reviewed  Last Vitals:  Vitals Value Taken Time  BP 128/76 10/29/19 1148  Temp    Pulse 81 10/29/19 1152  Resp 20 10/29/19 1152  SpO2 99 % 10/29/19 1152  Vitals shown include unvalidated device data.  Last Pain:  Vitals:   10/29/19 1100  TempSrc:   PainSc: 0-No pain      Patients Stated Pain Goal: 0 (76/16/07 3710)  Complications: No apparent anesthesia complications

## 2019-10-29 NOTE — Anesthesia Postprocedure Evaluation (Signed)
Anesthesia Post Note  Patient: [name]  Procedure(s) Performed: TRANSESOPHAGEAL ECHOCARDIOGRAM (TEE) WITH PROPOFOL (N/A )  Patient location during evaluation: PACU Anesthesia Type: General Level of consciousness: awake and alert and oriented Pain management: pain level controlled Vital Signs Assessment: post-procedure vital signs reviewed and stable Respiratory status: spontaneous breathing Cardiovascular status: blood pressure returned to baseline and stable Postop Assessment: no apparent nausea or vomiting Anesthetic complications: no     Last Vitals:  Vitals:   10/29/19 0513 10/29/19 1100  BP: 136/77 138/72  Pulse: 85 70  Resp: 16 20  Temp: 37.4 C 37.2 C  SpO2: 96% 96%    Last Pain:  Vitals:   10/29/19 1100  TempSrc:   PainSc: 0-No pain                 Terriah Reggio

## 2019-10-29 NOTE — Progress Notes (Signed)
PROGRESS NOTE  [name] GQB:[id number] DOB: [birth year] DOA: 10/22/2019 PCP: Dettinger, [name], MD  Brief History: 53 year old male with a history of diabetes mellitus type 2, diabetic polyneuropathy, hypertension, left BKA, and right toe amputations secondary to diabetic foot infections presenting with 1 day history of increasing erythema, edema, and pain on his right foot. The patient stated that his podiatrist, Dr. [name] started him on 10 days of doxycycline on 10/10/2019 for his diabetic foot ulcer on his right fourth toe on 10/10/2019.The patient is a bit vague regarding the history of his most recent foot ulcer on his right fourth toe, but stated that he has noted some intermittent drainage in the past 1 to 2 days prior to admission. He had denied any fevers, chills, headache, neck pain, chest pain, shortness breath, cough, hemoptysis, abdominal pain, dysuria, hematuria. The patient did have episodes of nausea and vomiting for the past 3 days. He is also noted some loose stools without any hematochezia or melena. Because of his nausea, vomiting, and right foot erythema and pain he presented for further evaluation. In the ED, the patient had temperature 100.6 F. He was noted to have atrial fibrillation with RVR with heart rates into the 180s. The patient was hemodynamically stable with oxygen saturation 96-97% on room air. WBC was 12.1 with hemoglobin 16.0 and platelets 176K.Sodium was 125 with serum creatinine 1.42. Lactic acid peaked at 3.8. The patient was fluid resuscitated and started on vancomycin and Zosyn. Blood cultures from the emergency department were also obtained.    Assessment/Plan: Sepsis with organ dysfunction -Secondary to diabetic foot infection and bacteremia -Lactic acid peaked 3.8 -check PCT10.37>>>7.96>>>6.70 -Continue vancomycin>>>cefazolin>>>amp/sulbactam -Discontinue Zosyn -Continue IV fluids>>saline  lock -SARS-CoV2--neg -10/24/19 spike fever 3 hours post-op -sepsis physiology resolved  Diabetic foot infection/cellulitis right foot/Osteomyelitis toe -Podiatry, Dr. [name] has been consulted--appreciate followup -MRI foot--osteomyelitis of R-third toe with diffuse cellulitis and myositis -CRP 40.7 -EKC00 -10/24/19--Right third ray amputation -10/24/19--bone culture--OSSA and S. anginosus + possible anaerobe  Bacteremia--Staph aureus-OSSA -Source is likely foot infection -12/17 blood culture--OSSA -12/18blood cultures--OSSA -12/18 bone culture--OSSA and S. anginosus + Prevotella (Bacteroides) melanogenica -12/19 blood culture--neg -12/20 blood culture--neg -Discontinue vanco -Discontinue Zosyn -cefazolin>>>unasyn--plan 2 weeks beyond last negative blood culture - TEE  12/24 did not show any evidence of vegetations -PICC line on 12/24 if blood cultures remain negatie  Right popliteal and peroneal DVT -10/25/19 venous duplex +DVT -already on heparin IV>>>apixaban-->adjust dose for dvt dosing  Leukocytosis -WBC slow increase last 48 hours -remains afebrile and hemodynamically stable--no sepsis physiology -Suspect this is related to underlying infection and fluid.  Mild improvement today.  Repeat in a.m.  DKA, type 2 -pt presented with serum glucose 389 with anion gap 15 and ketonuria -resolved with aggressive fluid resuscitation -transition to [place] insulin  AKI -Resulting from sepsis -Baseline creatinine 0.7-1.0 -Serum creatinine peaked 1.42 -improved  Atrial fibrillation with RVR, type unspecified -CHADS-VASc = 3 (HTN, DM2, PAD) -Patient is on anticoagulation with apixaban -Echo--EF 70-75%, no WMA, trace MR/TR, mild increase PASP -TSH--0.929 -switch to po amiodarone 12/20 -switch to po dilitazem 12/20 -appreciate cardiology followup  Uncontrolled diabetes mellitus type 2 with hyperglycemia -Anticipate elevated CBGs secondary to sepsis -07/22/2019  hemoglobin A1c 12.4 -Increase Lantus to 30 units hs -NovoLog sliding scale -Holding metformin -12/17/20A1C--9.8  Hypokalemia/hypomagnesemia -Replete -Added potassium to IV fluids  Hyponatremia -Multifactorial including hyperglycemia, volume depletion, AKI -switch to IV NS today  Intractable nausea and vomiting -Suspect a  component of gastritis/diabetic gastroparesis incited by sepsisand DKA -improved -advanced to carb modified diet  Diarrhea -C. difficile negative -If persistent, check stool pathogen panel  Essential hypertension -Blood pressure currently controlled on diltiazem drip -Holding lisinopril secondary to AKI initially and to allow BP margin for diltiazem titration  Hyperlipidemia -Continue statinwhen able to tolerate po  Left arm pain -venous duplex--show superficial phlebitis, continue supportive therapy        Disposition Plan:Anticipate discharge home in the next 24 hours Family Communication:NoFamily at bedside  Consultants:Cardiology, podiatry, infectious disease  Code Status: FULL  DVT Prophylaxis:Eliquis   Procedures: As Listed in Progress Note Above  Antibiotics: Unasyn 12/21>>> Cefazolin 12/20>>>12/21 vanco 12/16>>>12/20 Cefepime 12/18>>>12/20 Zosyn 12/16>>>12/17        Subjective: Denies any fevers, shortness of breath, cough.  No new complaints.  Objective: Vitals:   10/29/19 1148 10/29/19 1200 10/29/19 1404 10/29/19 1957  BP: 128/76 134/74 (!) 150/79   Pulse: 79 81 82   Resp: (!) 23 17 16    Temp: 97.8 F (36.6 C)  97.8 F (36.6 C)   TempSrc:   Oral   SpO2: 97% 98% 98% 98%  Weight:      Height:        Intake/Output Summary (Last 24 hours) at 10/29/2019 2020 Last data filed at 10/29/2019 1700 Gross per 24 hour  Intake 780 ml  Output 1500 ml  Net -720 ml   Weight change: -0.6 kg Exam:  General exam: Alert, awake, oriented x 3 Respiratory system: Clear to auscultation.  Respiratory effort normal. Cardiovascular system:RRR. No murmurs, rubs, gallops. Gastrointestinal system: Abdomen is nondistended, soft and nontender. No organomegaly or masses felt. Normal bowel sounds heard. Central nervous system: Alert and oriented. No focal neurological deficits. Extremities: Left foot is wrapped in dressings Skin: Erythema and mild tenderness noted over left forearm  Psychiatry: Judgement and insight appear normal. Mood & affect appropriate.      Data Reviewed: I have personally reviewed following labs and imaging studies Basic Metabolic Panel: Recent Labs  Lab 10/24/19 0357 10/25/19 0418 10/26/19 0444 10/27/19 0342 10/28/19 0353 10/29/19 0600  NA 131* 132* 130* 131* 132* 129*  K 3.8 3.9 3.5 3.4* 3.8 4.0  CL 96* 98 96* 93* 91* 91*  CO2 28 28 25 29 29 29   GLUCOSE 242* 211* 198* 160* 112* 184*  BUN 25* 20 18 14 16 16   CREATININE 0.87 0.69 0.69 0.65 0.66 0.81  CALCIUM 8.2* 8.0* 7.8* 7.5* 7.5* 7.5*  MG 1.8 1.8 1.6* 1.9 1.8  --    Liver Function Tests: Recent Labs  Lab 10/23/19 0359 10/24/19 0357  AST 25 22  ALT 28 27  ALKPHOS 83 69  BILITOT 1.6* 1.3*  PROT 6.0* 5.4*  ALBUMIN 2.6* 2.1*   No results for input(s): LIPASE, AMYLASE in the last 168 hours. No results for input(s): AMMONIA in the last 168 hours. Coagulation Profile: No results for input(s): INR, PROTIME in the last 168 hours. CBC: Recent Labs  Lab 10/23/19 0359 10/25/19 0418 10/26/19 0444 10/27/19 0342 10/28/19 0353 10/29/19 0600  WBC 7.7 9.8 11.9* 16.0* 19.6* 18.7*  NEUTROABS 6.3  --   --   --   --   --   HGB 13.8 12.0* 12.6* 12.7* 12.7* 12.3*  HCT 39.0 36.2* 37.5* 38.4* 38.7* 36.5*  MCV 87.8 91.6 88.9 89.7 91.3 90.6  PLT 123* 132* 185 256 308 295   Cardiac Enzymes: No results for input(s): CKTOTAL, CKMB, CKMBINDEX, TROPONINI in the last 168 hours.  BNP: Invalid input(s): POCBNP CBG: Recent Labs  Lab 10/28/19 2138 10/29/19 0741 10/29/19 1112 10/29/19 1152  10/29/19 1612  GLUCAP 171* 164* 109* 99 136*   HbA1C: No results for input(s): HGBA1C in the last 72 hours. Urine analysis:    Component Value Date/Time   COLORURINE YELLOW 10/22/2019 1508   APPEARANCEUR HAZY (A) 10/22/2019 1508   LABSPEC 1.017 10/22/2019 1508   PHURINE 5.0 10/22/2019 1508   GLUCOSEU >=500 (A) 10/22/2019 1508   HGBUR SMALL (A) 10/22/2019 1508   BILIRUBINUR NEGATIVE 10/22/2019 1508   BILIRUBINUR negative 03/26/2014 1212   KETONESUR 5 (A) 10/22/2019 1508   PROTEINUR NEGATIVE 10/22/2019 1508   UROBILINOGEN 0.2 06/13/2014 1048   NITRITE NEGATIVE 10/22/2019 1508   LEUKOCYTESUR NEGATIVE 10/22/2019 1508   Sepsis Labs: '@LABRCNTIP'$ (procalcitonin:4,lacticidven:4) ) Recent Results (from the past 240 hour(s))  Urine culture     Status: Abnormal   Collection Time: 10/22/19  3:08 PM   Specimen: In/Out Cath Urine  Result Value Ref Range Status   Specimen Description   Final    IN/OUT CATH URINE Performed at [facility], [address] [place], [place] [address]    Special Requests   Final    NONE Performed at [facility], [address] [place], [place] [address]    Culture (A)  Final    <10,000 COLONIES/mL INSIGNIFICANT GROWTH Performed at [facility], [place] [address] [place], [place] [address]    Report Status 10/23/2019 FINAL  Final  Blood Culture (routine x 2)     Status: Abnormal   Collection Time: 10/22/19  3:20 PM   Specimen: BLOOD LEFT WRIST  Result Value Ref Range Status   Specimen Description   Final    BLOOD LEFT WRIST Performed at [facility], [address], [place], [place] [address]    Special Requests   Final    BOTTLES DRAWN AEROBIC AND ANAEROBIC Blood Culture adequate volume Performed at [facility], [address] [place], [place] [address]    Culture  Setup Time   Final    GRAM POSITIVE COCCI BOTH ANAEROBIC AND AEROBIC BOTTLES Gram Stain Report Called to,Read Back By and Verified With: HEARN,J'@0705'$  BY MATTHEWS, B  12.17.2020 [facility] Performed at [facility], [address], [place], [place] [address]    Culture STAPHYLOCOCCUS AUREUS (A)  Final   Report Status 10/26/2019 FINAL  Final   Organism ID, Bacteria STAPHYLOCOCCUS AUREUS  Final      Susceptibility   Staphylococcus aureus - MIC*    CIPROFLOXACIN >=8 RESISTANT Resistant     ERYTHROMYCIN >=8 RESISTANT Resistant     GENTAMICIN <=0.5 SENSITIVE Sensitive     OXACILLIN 0.5 SENSITIVE Sensitive     TETRACYCLINE >=16 RESISTANT Resistant     VANCOMYCIN 1 SENSITIVE Sensitive     TRIMETH/SULFA <=10 SENSITIVE Sensitive     CLINDAMYCIN RESISTANT Resistant     RIFAMPIN <=0.5 SENSITIVE Sensitive     Inducible Clindamycin POSITIVE Resistant     * STAPHYLOCOCCUS AUREUS  Blood Culture (routine x 2)     Status: Abnormal   Collection Time: 10/22/19  3:31 PM   Specimen: Right Antecubital; Blood  Result Value Ref Range Status   Specimen Description   Final    RIGHT ANTECUBITAL Performed at [facility], [address] [place], [place] [address]    Special Requests   Final    BOTTLES DRAWN AEROBIC AND ANAEROBIC Blood Culture adequate volume Performed at [facility], [address], [place],  [place] [address]    Culture  Setup Time   Final    GRAM POSITIVE COCCI ANAEROBIC AND AEROBIC BOTTLES Gram Stain Report Called to,Read Back By and Verified With: HEARN,J@0705  BY MATTHEWS B 12.17.2020 [facility] Performed at [facility], [address] [place], [place] [address]    Culture (A)  Final    STAPHYLOCOCCUS AUREUS SUSCEPTIBILITIES PERFORMED ON PREVIOUS CULTURE WITHIN THE LAST 5 DAYS. Performed at [facility], [place] [address], [place], [place] [address]    Report Status 10/26/2019 FINAL  Final  SARS CORONAVIRUS 2 (TAT 6-24 HRS) Nasopharyngeal Nasopharyngeal Swab     Status: None   Collection Time: 10/22/19  4:14 PM   Specimen: Nasopharyngeal Swab  Result Value Ref Range Status   SARS Coronavirus 2 NEGATIVE NEGATIVE Final     Comment: (NOTE) SARS-CoV-2 target nucleic acids are NOT DETECTED. The SARS-CoV-2 RNA is generally detectable in upper and lower respiratory specimens during the acute phase of infection. Negative results do not preclude SARS-CoV-2 infection, do not rule out co-infections with other pathogens, and should not be used as the sole basis for treatment or other patient management decisions. Negative results must be combined with clinical observations, patient history, and epidemiological information. The expected result is Negative. Fact Sheet for Patients: SugarRoll.be Fact Sheet for Healthcare Providers: https://www.woods-mathews.com/ This test is not yet approved or cleared by the [place] FDA and  has been authorized for detection and/or diagnosis of SARS-CoV-2 by FDA under an Emergency Use Authorization (EUA). This EUA will remain  in effect (meaning this test can be used) for the duration of the COVID-19 declaration under Section 56 4(b)(1) of the Act, 21 U.S.C. section 360bbb-3(b)(1), unless the authorization is terminated or revoked sooner. Performed at [facility], [place] [address] [place], [place] [address]   MRSA PCR Screening     Status: None   Collection Time: 10/22/19  8:11 PM   Specimen: Nasal Mucosa; Nasopharyngeal  Result Value Ref Range Status   MRSA by PCR NEGATIVE NEGATIVE Final    Comment:        The GeneXpert MRSA Assay (FDA approved for NASAL specimens only), is one component of a comprehensive MRSA colonization surveillance program. It is not intended to diagnose MRSA infection nor to guide or monitor treatment for MRSA infections. Performed at [facility], [address] [place], [place] [address]   C difficile quick scan w PCR reflex     Status: None   Collection Time: 10/22/19 11:56 PM   Specimen: STOOL  Result Value Ref Range Status   C Diff antigen NEGATIVE NEGATIVE Final   C Diff toxin NEGATIVE  NEGATIVE Final   C Diff interpretation No C. difficile detected.  Final    Comment: Performed at [facility], [address] [place], [place] [address]  Culture, blood (routine x 2)     Status: None   Collection Time: 10/23/19  8:44 AM   Specimen: BLOOD RIGHT HAND  Result Value Ref Range Status   Specimen Description BLOOD RIGHT HAND  Final   Special Requests   Final    BOTTLES DRAWN AEROBIC AND ANAEROBIC Blood Culture adequate volume   Culture   Final    NO GROWTH 5 DAYS Performed at [facility], [address] [place], [place] [address]    Report Status 10/28/2019 FINAL  Final  Culture, blood (routine x 2)     Status: None   Collection Time: 10/23/19  8:46 AM  Specimen: BLOOD RIGHT HAND  Result Value Ref Range Status   Specimen Description BLOOD RIGHT HAND  Final   Special Requests   Final    BOTTLES DRAWN AEROBIC AND ANAEROBIC Blood Culture results may not be optimal due to an excessive volume of blood received in culture bottles   Culture   Final    NO GROWTH 5 DAYS Performed at [facility], [address], [place], [place] [address]    Report Status 10/28/2019 FINAL  Final  Aerobic/Anaerobic Culture (surgical/deep wound)     Status: None   Collection Time: 10/24/19  2:20 PM   Specimen: Soft Tissue, Other  Result Value Ref Range Status   Specimen Description   Final    TOE SOFT TISSUE AND BONE OF 3RD Performed at [facility], [place] [address], [place], [place] [address]    Special Requests   Final    NONE Performed at [facility], [address] [place], [place] [address]    Gram Stain NO WBC SEEN FEW GRAM POSITIVE COCCI   Final   Culture   Final    MODERATE STAPHYLOCOCCUS AUREUS FEW STREPTOCOCCUS ANGINOSIS FEW PREVOTELLA MELANINOGENICA BETA LACTAMASE POSITIVE Performed at [facility], [place] [address], [place], [place] [address]    Report Status 10/28/2019 FINAL  Final   Organism ID, Bacteria STAPHYLOCOCCUS AUREUS  Final   Organism ID, Bacteria  STREPTOCOCCUS ANGINOSIS  Final      Susceptibility   Staphylococcus aureus - MIC*    CIPROFLOXACIN >=8 RESISTANT Resistant     ERYTHROMYCIN >=8 RESISTANT Resistant     GENTAMICIN <=0.5 SENSITIVE Sensitive     OXACILLIN 0.5 SENSITIVE Sensitive     TETRACYCLINE >=16 RESISTANT Resistant     VANCOMYCIN <=0.5 SENSITIVE Sensitive     TRIMETH/SULFA <=10 SENSITIVE Sensitive     CLINDAMYCIN RESISTANT Resistant     RIFAMPIN <=0.5 SENSITIVE Sensitive     Inducible Clindamycin POSITIVE Resistant     * MODERATE STAPHYLOCOCCUS AUREUS   Streptococcus anginosis - MIC*    PENICILLIN <=0.06 SENSITIVE Sensitive     CEFTRIAXONE <=0.12 SENSITIVE Sensitive     ERYTHROMYCIN >=8 RESISTANT Resistant     LEVOFLOXACIN <=0.25 SENSITIVE Sensitive     VANCOMYCIN 0.5 SENSITIVE Sensitive     * FEW STREPTOCOCCUS ANGINOSIS  Culture, blood (routine x 2)     Status: Abnormal   Collection Time: 10/24/19  6:33 PM   Specimen: BLOOD RIGHT ARM  Result Value Ref Range Status   Specimen Description   Final    BLOOD RIGHT ARM Performed at [facility], [address], [place], [place] [address]    Special Requests   Final    BOTTLES DRAWN AEROBIC AND ANAEROBIC Blood Culture adequate volume Performed at [facility], [address], [place], [place] [address]    Culture  Setup Time   Final    IN BOTH AEROBIC AND ANAEROBIC BOTTLES CORRECTED ON 12/22 AT 0140: PREVIOUSLY REPORTED AS GRAM POSITIVE COCCI ANAEROBIC BOTTLE ONLY Gram Stain Report Called to,Read Back By and Verified With: [email or web address] 0300 ON [id number] BY HENDERSON L. CRITICAL RESULT CALLED TO, READ BACK BY AND VERIFIED WITH: E. MURPHY, RN (APH) AT 2263 ON 10/25/19 BY C. JESSUP, MT.    Culture (A)  Final    STAPHYLOCOCCUS AUREUS SUSCEPTIBILITIES PERFORMED ON PREVIOUS CULTURE WITHIN THE LAST 5 DAYS. Performed at [facility], [place] [address] [place], [place] [address]    Report Status 10/27/2019 FINAL  Final  Blood Culture ID Panel (Reflexed)     Status: Abnormal    Collection Time: 10/24/19  6:33 PM  Result Value Ref Range Status   Enterococcus species NOT DETECTED NOT DETECTED Final   Listeria monocytogenes NOT DETECTED NOT DETECTED Final   Staphylococcus species DETECTED (A) NOT DETECTED Final    Comment: CRITICAL RESULT CALLED TO, READ BACK BY AND VERIFIED WITH: E. MURPHY, RN (APH) AT 1820 ON 10/25/19 BY C. JESSUP, MT.    Staphylococcus aureus (BCID) DETECTED (A) NOT DETECTED Final    Comment: Methicillin (oxacillin) susceptible Staphylococcus aureus (MSSA). Preferred therapy is anti staphylococcal beta lactam antibiotic (Cefazolin or Nafcillin), unless clinically contraindicated. CRITICAL RESULT CALLED TO, READ BACK BY AND VERIFIED WITH: E. MURPHY, RN (APH) AT 7867 ON 10/25/19 BY C. JESSUP, MT.    Methicillin resistance NOT DETECTED NOT DETECTED Final   Streptococcus species NOT DETECTED NOT DETECTED Final   Streptococcus agalactiae NOT DETECTED NOT DETECTED Final   Streptococcus pneumoniae NOT DETECTED NOT DETECTED Final   Streptococcus pyogenes NOT DETECTED NOT DETECTED Final   Acinetobacter baumannii NOT DETECTED NOT DETECTED Final   Enterobacteriaceae species NOT DETECTED NOT DETECTED Final   Enterobacter cloacae complex NOT DETECTED NOT DETECTED Final   Escherichia coli NOT DETECTED NOT DETECTED Final   Klebsiella oxytoca NOT DETECTED NOT DETECTED Final   Klebsiella pneumoniae NOT DETECTED NOT DETECTED Final   Proteus species NOT DETECTED NOT DETECTED Final   Serratia marcescens NOT DETECTED NOT DETECTED Final   Haemophilus influenzae NOT DETECTED NOT DETECTED Final   Neisseria meningitidis NOT DETECTED NOT DETECTED Final   Pseudomonas aeruginosa NOT DETECTED NOT DETECTED Final   Candida albicans NOT DETECTED NOT DETECTED Final   Candida glabrata NOT DETECTED NOT DETECTED Final   Candida krusei NOT DETECTED NOT DETECTED Final   Candida parapsilosis NOT DETECTED NOT DETECTED Final   Candida tropicalis NOT DETECTED NOT DETECTED Final     Comment: Performed at [facility], [place] [address], [place], [place] [address]  Culture, blood (routine x 2)     Status: Abnormal   Collection Time: 10/24/19  6:38 PM   Specimen: BLOOD RIGHT HAND  Result Value Ref Range Status   Specimen Description   Final    BLOOD RIGHT HAND Performed at [facility], [address] [place], [place] [address]    Special Requests   Final    BOTTLES DRAWN AEROBIC AND ANAEROBIC Blood Culture adequate volume Performed at [facility], [address], [place], [place] [address]    Culture  Setup Time   Final    GRAM POSITIVE COCCI ANAEROBIC BOTTLE ONLY Gram Stain Report Called to,Read Back By and Verified With: HYLTON @ 2947 ON [id number] BY HENDERSON L. CRITICAL VALUE NOTED.  VALUE IS CONSISTENT WITH PREVIOUSLY REPORTED AND CALLED VALUE.    Culture (A)  Final    STAPHYLOCOCCUS AUREUS SUSCEPTIBILITIES PERFORMED ON PREVIOUS CULTURE WITHIN THE LAST 5 DAYS. Performed at [facility], [place] [address], [place], [place] [address]    Report Status 10/27/2019 FINAL  Final  Culture, blood (routine x 2)     Status: None (Preliminary result)   Collection Time: 10/25/19  2:38 PM   Specimen: Right Antecubital; Blood  Result Value Ref Range Status   Specimen Description   Final    RIGHT ANTECUBITAL BOTTLES DRAWN AEROBIC AND ANAEROBIC   Special Requests Blood Culture adequate volume  Final   Culture   Final    NO GROWTH 4 DAYS  Performed at [facility], [address] [place], [place] [address]    Report Status PENDING  Incomplete  Culture, blood (routine x 2)     Status: None (Preliminary result)   Collection Time: 10/26/19 11:17 AM   Specimen: BLOOD RIGHT ARM  Result Value Ref Range Status   Specimen Description BLOOD RIGHT ARM  Final   Special Requests   Final    BOTTLES DRAWN AEROBIC AND ANAEROBIC Blood Culture adequate volume   Culture   Final    NO GROWTH 3 DAYS Performed at [facility], [address], [place], [place] [address]     Report Status PENDING  Incomplete  Culture, blood (routine x 2)     Status: None (Preliminary result)   Collection Time: 10/26/19 11:17 AM   Specimen: BLOOD LEFT HAND  Result Value Ref Range Status   Specimen Description BLOOD LEFT HAND  Final   Special Requests   Final    BOTTLES DRAWN AEROBIC ONLY Blood Culture adequate volume   Culture   Final    NO GROWTH 3 DAYS Performed at [facility], [address] [place],  [address]    Report Status PENDING  Incomplete     Scheduled Meds: . amiodarone  200 mg Oral BID  . apixaban  10 mg Oral BID   Followed by  . [START ON 11/04/2019] apixaban  5 mg Oral BID  . Chlorhexidine Gluconate Cloth  6 each Topical Q0600  . diltiazem  120 mg Oral Daily  . insulin aspart  0-15 Units Subcutaneous TID WC  . insulin aspart  0-5 Units Subcutaneous QHS  . insulin glargine  30 Units Subcutaneous QHS  . sodium chloride flush  3 mL Intravenous Q12H   Continuous Infusions: . ampicillin-sulbactam (UNASYN) IV 3 g (10/29/19 1543)    Procedures/Studies: DG Tibia/Fibula Right  Result Date: 10/22/2019 CLINICAL DATA:  Right leg pain and tenderness. Fever. Clinical evidence of infection. Diabetes. EXAM: RIGHT TIBIA AND FIBULA - 2 VIEW COMPARISON:  Right ankle radiographs obtained at the same time. FINDINGS: Mild diffuse soft tissue swelling. Previously described chronic appearing periosteal reaction involving the distal tibia. On these radiographs, this is involving the medial and posterior aspects of the tibia. Also again noted are previously described bone collapse and fragmentation involving the talotibial joint and flattening of the normal plantar arch. No soft tissue gas is seen. Mild medial and lateral spur formation at the knee joint with mild to moderate medial joint space narrowing. IMPRESSION: 1. Chronic appearing periosteal reaction involving the posterior and medial aspects of the distal tibia. Underlying chronic osteomyelitis is a possibility.  2. Diffuse soft tissue swelling without gas or bone destruction. 3. 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 Electronically Signed   By: Claudie Revering M.D.   On: 10/22/2019 16:05   DG Ankle Complete Right  Result Date: 10/22/2019 CLINICAL DATA:  Right leg pain and tenderness and fever with clinical evidence of infection. Diabetes. EXAM: RIGHT ANKLE - COMPLETE 3+ VIEW COMPARISON:  Right foot radiographs obtained today and on 06/19/2016. FINDINGS: Diffuse soft tissue swelling. Bone collapse and fragmentation at the talotibial joint, described on the foot radiographs, in addition to previously described flattening of the normal plantar arch. Again demonstrated are is chronic appearing posterior periosteal reaction involving the distal tibia. No bone destruction or soft tissue gas seen. IMPRESSION: 1. Diffuse soft tissue swelling without underlying changes of acute osteomyelitis. 2. Neuropathic changes at the talotibial joint. 3. Pes planus. Electronically  Signed   By: Claudie Revering M.D.   On: 10/22/2019 16:02   MR FOOT RIGHT W WO CONTRAST  Result Date: 10/23/2019 CLINICAL DATA:  Nonhealing ulcer involving the third toe. History of prior amputations. EXAM: MRI OF THE RIGHT FOREFOOT WITHOUT AND WITH CONTRAST TECHNIQUE: Multiplanar, multisequence MR imaging of the right foot was performed before and after the administration of intravenous contrast. CONTRAST:  30m GADAVIST GADOBUTROL 1 MMOL/ML IV SOLN COMPARISON:  Radiographs 10/22/2019 FINDINGS: Abnormal T1 and T2 signal intensity in the proximal phalanx of the third toe and also in what appears to be a small residual portion of the middle phalanx. There is also associated abnormal contrast enhancement and surrounding cellulitis. Findings consistent with osteomyelitis. No other definite sites of acute osteomyelitis are identified. Evidence of prior amputations involving the first and fifth toes and parts of the other toes. The metatarsal bones  are intact. No findings for septic arthritis involving the remaining MTP joints or involving the midfoot joint spaces. There is moderate edema like signal abnormality in the navicular bone along with some enhancement. Could not exclude osteomyelitis. Severe diffuse cellulitis involving the entire midfoot and forefoot. Mild diffuse myositis without findings for pyomyositis. IMPRESSION: 1. Osteomyelitis involving the third digit as detailed above. 2. Abnormal signal intensity and enhancement in the navicular bone. This is not completely covered but could not exclude osteomyelitis. 3. Severe diffuse cellulitis and mild diffuse myositis. Electronically Signed   By: PMarijo SanesM.D.   On: 10/23/2019 16:50   [place] Im[place] Lower Unilateral Right (DVT)  Result Date: 10/25/2019 CLINICAL DATA:  Right calf pain and edema. EXAM: RIGHT LOWER EXTREMITY VENOUS DOPPLER ULTRASOUND TECHNIQUE: Gray-scale sonography with graded compression, as well as color Doppler and duplex ultrasound were performed to evaluate the lower extremity deep venous systems from the level of the common femoral vein and including the common femoral, femoral, profunda femoral, popliteal and calf veins including the posterior tibial, peroneal and gastrocnemius veins when visible. The superficial great saphenous vein was also interrogated. Spectral Doppler was utilized to evaluate flow at rest and with distal augmentation maneuvers in the common femoral, femoral and popliteal veins. COMPARISON:  None. FINDINGS: Contralateral Common Femoral Vein: Respiratory phasicity is normal and symmetric with the symptomatic side. No evidence of thrombus. Normal compressibility. Common Femoral Vein: No evidence of thrombus. Normal compressibility, respiratory phasicity and response to augmentation. Saphenofemoral Junction: No evidence of thrombus. Normal compressibility and flow on color Doppler imaging. Profunda Femoral Vein: No evidence of thrombus. Normal  compressibility and flow on color Doppler imaging. Femoral Vein: No evidence of thrombus. Normal compressibility, respiratory phasicity and response to augmentation. Popliteal Vein: Occlusive thrombus present in the right popliteal vein. Calf Veins: Posterior tibial vein normally patent. Visualized peroneal vein demonstrates some nonocclusive thrombus in the upper calf. Superficial Great Saphenous Vein: No evidence of thrombus. Normal compressibility. Venous Reflux:  None. Other Findings: No evidence of superficial thrombophlebitis or abnormal fluid collection. IMPRESSION: Positive for right lower extremity DVT with occlusive thrombus in the popliteal vein and nonocclusive thrombus in the peroneal vein. Electronically Signed   By: GAletta EdouardM.D.   On: 10/25/2019 14:21   [place] Im[place] Upper Uni Left (DVT)  Result Date: 10/29/2019 CLINICAL DATA:  Left upper extremity pain and swelling for the past 4 days. Prior history of lower extremity DVT EXAM: LEFT UPPER EXTREMITY VENOUS DOPPLER ULTRASOUND TECHNIQUE: Gray-scale sonography with graded compression, as well as color Doppler and duplex ultrasound were performed to evaluate the  upper extremity deep venous system from the level of the subclavian vein and including the jugular, axillary, basilic, radial, ulnar and upper cephalic vein. Spectral Doppler was utilized to evaluate flow at rest and with distal augmentation maneuvers. COMPARISON:  None. FINDINGS: Contralateral Subclavian Vein: Respiratory phasicity is normal and symmetric with the symptomatic side. No evidence of thrombus. Normal compressibility. Internal Jugular Vein: No evidence of thrombus. Normal compressibility, respiratory phasicity and response to augmentation. Subclavian Vein: No evidence of thrombus. Normal compressibility, respiratory phasicity and response to augmentation. Axillary Vein: No evidence of thrombus. Normal compressibility, respiratory phasicity and response to augmentation.  Cephalic Vein: Abnormal and noncompressible. The lumen is expanded and filled with low-level internal echoes. Trace flow visible on color Doppler imaging. Findings are consistent with acute to subacute thrombus within the cephalic vein beginning in the proximal forearm and extending into the distal upper arm. Basilic Vein: No evidence of thrombus. Normal compressibility, respiratory phasicity and response to augmentation. Brachial Veins: No evidence of thrombus. Normal compressibility, respiratory phasicity and response to augmentation. Radial Veins: No evidence of thrombus. Normal compressibility, respiratory phasicity and response to augmentation. Ulnar Veins: No evidence of thrombus. Normal compressibility, respiratory phasicity and response to augmentation. Venous Reflux:  None visualized. Other Findings:  Superficial subcutaneous edema. IMPRESSION: 1. Positive for superficial venous thrombophlebitis involving the cephalic vein from the distal upper arm into the mid forearm. 2. No evidence of deep venous thrombosis in the left upper extremity. Electronically Signed   By: Jacqulynn Cadet M.D.   On: 10/29/2019 15:15   DG Chest Port 1 View  Result Date: 10/22/2019 CLINICAL DATA:  Fever EXAM: PORTABLE CHEST 1 VIEW COMPARISON:  12/09/2013 chest radiograph. FINDINGS: Stable cardiomediastinal silhouette with top-normal heart size. No pneumothorax. No pleural effusion. Lungs appear clear, with no acute consolidative airspace disease and no pulmonary edema. IMPRESSION: No active disease. Electronically Signed   By: Ilona Sorrel M.D.   On: 10/22/2019 15:55   DG Foot Complete Right  Result Date: 10/24/2019 CLINICAL DATA:  Patient status post amputation of the right third metatarsal head and toe today. EXAM: RIGHT FOOT COMPLETE - 3+ VIEW COMPARISON:  Plain films the right foot 10/22/2019. FINDINGS: The right third toe and head and neck of the third metatarsal have been amputated. Surgical wound with packing  noted. The patient is status post prior amputation of the great toe and distal phalanx of the second toe. 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 No acute abnormality. Electronically Signed   By: Inge Rise M.D.   On: 10/24/2019 15:33   DG Foot Complete Right  Result Date: 10/22/2019 CLINICAL DATA:  Right foot pain and fever. Diabetes. Clinical infection. EXAM: RIGHT FOOT COMPLETE - 3+ VIEW COMPARISON:  06/19/2016. FINDINGS: Diffuse soft tissue swelling. Again demonstrated surgical absence of the right great toe with interval surgical absence of the right little toe. Well-defined bone margins. No periosteal reaction or bone destruction involving the bones of the foot. There is flattening of the normal plantar arch and interval partial bone collapse and fragmentation involving the distal tibia and talus. There is also chronic appearing periosteal thickening and irregularity involving the distal tibia posteriorly. IMPRESSION: 1. Diffuse soft tissue swelling without soft tissue gas or evidence of underlying osteomyelitis. 2. Pes planus. 3. 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 Electronically Signed   By: Claudie Revering M.D.   On: 10/22/2019 15:59   ECHOCARDIOGRAM COMPLETE  Result Date: 10/23/2019   ECHOCARDIOGRAM REPORT   Patient Name:   JARVIN OGREN Date of Exam: 10/23/2019 Medical Rec #:  [id number]     Height:       76.0 in Accession #:    [id number]    Weight:       206.8 lb Date of Birth:  Sep 25, 1966    BSA:          2.25 m Patient Age:    4 years      BP:           99/54 mmHg Patient Gender: M             HR:           120 bpm. Exam Location:  [facility] Procedure: 2D Echo, Cardiac Doppler and Color Doppler Indications:    Atrial Fibrillation 427.31 / I48.91  History:        Patient has no prior history of Echocardiogram examinations.                 Arrythmias:Atrial Fibrillation; Risk  Factors:Diabetes and                 Hypertension.  Sonographer:    Alvino Chapel RCS Referring Phys: [phone] DAVID TAT IMPRESSIONS  1. Left ventricular ejection fraction, by visual estimation, is 70 to 75%. The left ventricle has hyperdynamic function. There is mildly increased left ventricular hypertrophy.  2. Left ventricular diastolic parameters are indeterminate.  3. The left ventricle has no regional wall motion abnormalities.  4. Global right ventricle has normal systolic function.The right ventricular size is normal. No increase in right ventricular wall thickness.  5. Left atrial size was normal.  6. Right atrial size was normal.  7. The mitral valve is grossly normal. Trivial mitral valve regurgitation.  8. The tricuspid valve is grossly normal. Tricuspid valve regurgitation is trivial.  9. The aortic valve is tricuspid. Aortic valve regurgitation is not visualized. 10. The pulmonic valve was grossly normal. Pulmonic valve regurgitation is not visualized. 11. Mildly elevated pulmonary artery systolic pressure. 12. The tricuspid regurgitant velocity is 3.04 m/s, and with an assumed right atrial pressure of 3 mmHg, the estimated right ventricular systolic pressure is mildly elevated at 40.0 mmHg. 13. The inferior vena cava is normal in size with greater than 50% respiratory variability, suggesting right atrial pressure of 3 mmHg. FINDINGS  Left Ventricle: Left ventricular ejection fraction, by visual estimation, is 70 to 75%. The left ventricle has hyperdynamic function. The left ventricle has no regional wall motion abnormalities. The left ventricular internal cavity size was the left ventricle is normal in size. There is mildly increased left ventricular hypertrophy. Septal left ventricular hypertrophy. Left ventricular diastolic parameters are indeterminate. Right Ventricle: The right ventricular size is normal. No increase in right ventricular wall thickness. Global RV systolic function is has normal  systolic function. The tricuspid regurgitant velocity is 3.04 m/s, and with an assumed right atrial pressure  of 3 mmHg, the estimated right ventricular systolic pressure is mildly elevated at 40.0 mmHg. Left Atrium: Left atrial size was normal in size. Right Atrium: Right atrial size was normal in size Pericardium: There is no evidence of pericardial effusion. Mitral Valve: The mitral valve is grossly normal. Trivial mitral valve regurgitation. Tricuspid Valve: The tricuspid valve is grossly normal. Tricuspid valve regurgitation is trivial. Aortic Valve: The aortic valve is tricuspid. Aortic valve regurgitation is not visualized. Pulmonic Valve: The pulmonic valve was grossly normal. Pulmonic  valve regurgitation is not visualized. Pulmonic regurgitation is not visualized. Aorta: The aortic root is normal in size and structure. Venous: The inferior vena cava is normal in size with greater than 50% respiratory variability, suggesting right atrial pressure of 3 mmHg. IAS/Shunts: No atrial level shunt detected by color flow Doppler.  LEFT VENTRICLE PLAX 2D LVIDd:         4.44 cm LVIDs:         2.56 cm LV PW:         1.00 cm LV IVS:        1.26 cm LVOT diam:     2.00 cm LV SV:         66 ml LV SV Index:   29.33 LVOT Area:     3.14 cm  RIGHT VENTRICLE TAPSE (M-mode): 1.7 cm LEFT ATRIUM             Index       RIGHT ATRIUM           Index LA diam:        3.30 cm 1.47 cm/m  RA Area:     20.60 cm LA Vol (A2C):   52.0 ml 23.14 ml/m RA Volume:   57.20 ml  25.46 ml/m LA Vol (A4C):   40.9 ml 18.20 ml/m LA Biplane Vol: 47.2 ml 21.01 ml/m  AORTIC VALVE LVOT Vmax:   129.00 cm/s LVOT Vmean:  82.933 cm/s LVOT VTI:    0.185 m  AORTA Ao Root diam: 3.30 cm MITRAL VALVE                        TRICUSPID VALVE MV Area (PHT): 3.21 cm             TR Peak grad:   37.0 mmHg MV PHT:        68.44 msec           TR Vmax:        304.00 cm/s MV Decel Time: 236 msec MV E velocity: 107.00 cm/s 103 cm/s SHUNTS                                      Systemic VTI:  0.19 m                                     Systemic Diam: 2.00 cm  Rozann Lesches MD Electronically signed by Rozann Lesches MD Signature Date/Time: 10/23/2019/9:39:01 AM    Final    ECHO TEE  Result Date: 10/29/2019   TRANSESOPHOGEAL ECHO REPORT   Patient Name:   EULAS SCHWEITZER Date of Exam: 10/29/2019 Medical Rec #:  [id number]     Height:       76.0 in Accession #:    [id number]    Weight:       223.5 lb Date of Birth:  08-Dec-1965    BSA:          2.32 m Patient Age:    10 years      BP:           134/74 mmHg Patient Gender: M             HR:           81 bpm. Exam Location:  [facility]  Procedure:  Transesophageal Echo Indications:    Bacteremia  History:        Patient has prior history of Echocardiogram examinations, most                 recent 10/23/2019. S/P BKA (below knee amputation.  Sonographer:    Leavy Cella RDCS (AE) Referring Phys: [id number] Erma Heritage  PROCEDURE: The transesophogeal probe was passed through the esophogus of the patient. The patient developed no complications during the procedure. IMPRESSIONS  1. Left ventricular ejection fraction, by visual estimation, is 60 to 65%. The left ventricle has normal function. There is no left ventricular hypertrophy.  2. The left ventricle has no regional wall motion abnormalities.  3. Global right ventricle has normal systolic function.The right ventricular size is normal. Right vetricular wall thickness was not assessed.  4. Left atrial size was normal.  5. Right atrial size was normal.  6. The mitral valve is normal in structure. No evidence of mitral valve regurgitation.  7. The tricuspid valve is normal in structure.  8. The aortic valve is normal in structure. Aortic valve regurgitation is not visualized.  9. The pulmonic valve was normal in structure. Pulmonic valve regurgitation is trivial. FINDINGS  Left Ventricle: Left ventricular ejection fraction, by visual estimation, is 60 to 65%. The left ventricle has  normal function. The left ventricle has no regional wall motion abnormalities. There is no left ventricular hypertrophy. Right Ventricle: The right ventricular size is normal. Right vetricular wall thickness was not assessed. Global RV systolic function is has normal systolic function. Left Atrium: Left atrial size was normal in size. LA, LAA without masses. Right Atrium: Right atrial size was normal in size Pericardium: There is no evidence of pericardial effusion. Mitral Valve: The mitral valve is normal in structure. No evidence of mitral valve regurgitation. Tricuspid Valve: The tricuspid valve is normal in structure. Tricuspid valve regurgitation is trivial. Aortic Valve: The aortic valve is normal in structure. Aortic valve regurgitation is not visualized. Pulmonic Valve: The pulmonic valve was normal in structure. Pulmonic valve regurgitation is trivial. Aorta: The aortic root is normal in size and structure. Shunts: Saline contrast bubble study was negative, with no evidence of any interatrial shunt. No atrial level shunt detected by color flow Doppler.  [name] MD Electronically signed by [name] MD Signature Date/Time: 10/29/2019/4:08:15 PM    Final    [place] EKG SITE RITE  Result Date: 10/29/2019 If Site Rite image not attached, placement could not be confirmed due to current cardiac rhythm.   Kathie Dike, MD  [facility]  If 7PM-7AM, please contact night-coverage [email or web address]  10/29/2019, 8:20 PM   LOS: 7 days

## 2019-10-29 NOTE — Progress Notes (Signed)
PHARMACY CONSULT NOTE FOR:  OUTPATIENT  PARENTERAL ANTIBIOTIC THERAPY (OPAT)  Indication: MSSA bacteremia/toe infection Regimen: Cefazolin 2 gm every 8 hours + Metronidazole 500 mg PO Q 8 hours End date: 11/08/2019  IV antibiotic discharge orders are pended. To discharging provider:  please sign these orders via discharge navigator,  Select New Orders & click on the button choice - Manage This Unsigned Work.     Thank you for allowing pharmacy to be a part of this patient's care.  Jimmy Footman, PharmD, BCPS, BCIDP Infectious Diseases Clinical Pharmacist Phone: [phone] 10/29/2019, 3:39 PM

## 2019-10-29 NOTE — Interval H&P Note (Signed)
History and Physical Interval Note:  10/29/2019 11:59 AM  [name]  has presented today for surgery, with the diagnosis of bacteremia.  The various methods of treatment have been discussed with the patient and family. After consideration of risks, benefits and other options for treatment, the patient has consented to  Procedure(s): TRANSESOPHAGEAL ECHOCARDIOGRAM (TEE) WITH PROPOFOL (N/A) as a surgical intervention.  The patient's history has been reviewed, patient examined, no change in status, stable for surgery.  I have reviewed the patient's chart and labs.  Questions were answered to the patient's satisfaction.     [name]

## 2019-10-29 NOTE — Progress Notes (Signed)
Patient ID: CHANCELOR HARDRICK, male   DOB: 04/07/1966, 53 y.o.   MRN: [id number]  Patient ID: CLIFFTON SPRADLEY, male   DOB: 1966-06-26, 53 y.o.   MRN: [id number]          [facility] for Infectious Disease    Date of Admission:  10/22/2019   Day 8 antibiotics        Day 3 cefazolin  He was admitted with infection of his right third toe which has now been amputated.  His infection was complicated by MSSA bacteremia.  Repeat blood cultures on 10/25/2019 and 10/26/2019 remain negative.  His operative culture of his third toe has grown MSSA, strep anginosis and Prevotella.  There was no evidence of endocarditis on TEE today.  I would treat him with IV antibiotic based therapy for 2 weeks following his first negative blood culture through 11/08/2019.  Upon discharge I would convert him back to IV cefazolin and add oral metronidazole (his history reports no alcohol use) for anaerobic coverage while his operative site is healing.  He can have a PICC safely placed at any time.  I will sign off now.         Michel Bickers, MD [facility] [phone] pager   [phone] cell

## 2019-10-29 NOTE — CV Procedure (Signed)
TEE  Patient anesthetized by anesthesia with Propofol intravenously Bite block placed in mouth  TEE probe advanced to mid esophagus without difficulty   LA, LAA without masses TV normal  Mild TR AV normal  No AI PV normal  Trace PI MV normal  Trace MR  LVEF and RVEF normal  No PFO by color doppler or with injection of agitated saline  Normal thoracic aorta   Procedure was without complication.    FUll report to follow  [name] MD

## 2019-10-29 NOTE — Progress Notes (Signed)
Podiatry Progress Note  Subjective POD5S/P partial 3rd ray amputation. Denies any nausea, vomiting, shortness of breath or chest pain. No foot/ankle pain.  TEE earlier today.  Objective Vital signs in last 24 hours:   Temp:  [97.8 F (36.6 C)-99.9 F (37.7 C)] 97.8 F (36.6 C) (12/23 1404) Pulse Rate:  [70-86] 82 (12/23 1404) Resp:  [16-23] 16 (12/23 1404) BP: (128-155)/(72-79) 150/79 (12/23 1404) SpO2:  [96 %-98 %] 98 % (12/23 1404) Weight:  [101.4 kg] 101.4 kg (12/23 0500)  DRESSING(S): Intactwithout strikethrough. Heel protective boot not in place. INTEGUMENT: Skin edges at amputation siteremainviable. Wound bed viable with no necrosis present. Furtherimprovement in erythema of foot and ankle.No skin changes along the posterior of the right heel. VASCULAR: Pedal pulses are palpable.Edema of theRLE improving. MUSCULOSKELETAL:Rcalf is soft and nontender.Charcot deformity of R foot. This is not a new finding for this patient. R ankle remains NTTP.  Lab/Test Results  Recent Labs    10/28/19 0353 10/29/19 0600  WBC 19.6* 18.7*  HGB 12.7* 12.3*  HCT 38.7* 36.5*  PLT 308 295  NA 132* 129*  K 3.8 4.0  CL 91* 91*  CO2 29 29  BUN 16 16  CREATININE 0.66 0.81  GLUCOSE 112* 184*  CALCIUM 7.5* 7.5*    Recent Results (from the past 240 hour(s))  Urine culture     Status: Abnormal   Collection Time: 10/22/19  3:08 PM   Specimen: In/Out Cath Urine  Result Value Ref Range Status   Specimen Description   Final    IN/OUT CATH URINE Performed at [facility], [address] [place], [place] [address]    Special Requests   Final    NONE Performed at [facility], [address] [place], [place] [address]    Culture (A)  Final    <10,000 COLONIES/mL INSIGNIFICANT GROWTH Performed at [facility], [place] [address], [place], [place] [address]    Report Status 10/23/2019 FINAL  Final  Blood Culture (routine x 2)     Status: Abnormal   Collection Time:  10/22/19  3:20 PM   Specimen: BLOOD LEFT WRIST  Result Value Ref Range Status   Specimen Description   Final    BLOOD LEFT WRIST Performed at [facility], [address] [place], [place] [address]    Special Requests   Final    BOTTLES DRAWN AEROBIC AND ANAEROBIC Blood Culture adequate volume Performed at [facility], [address], [place], [place] [address]    Culture  Setup Time   Final    GRAM POSITIVE COCCI BOTH ANAEROBIC AND AEROBIC BOTTLES Gram Stain Report Called to,Read Back By and Verified With: HEARN,J@0705  BY MATTHEWS, B 12.17.2020 [facility] Performed at [facility], [address] [place], [place] [address]    Culture STAPHYLOCOCCUS AUREUS (A)  Final   Report Status 10/26/2019 FINAL  Final   Organism ID, Bacteria STAPHYLOCOCCUS AUREUS  Final      Susceptibility   Staphylococcus aureus - MIC*    CIPROFLOXACIN >=8 RESISTANT Resistant     ERYTHROMYCIN >=8 RESISTANT Resistant     GENTAMICIN <=0.5 SENSITIVE Sensitive     OXACILLIN 0.5 SENSITIVE Sensitive     TETRACYCLINE >=16 RESISTANT Resistant     VANCOMYCIN 1 SENSITIVE Sensitive     TRIMETH/SULFA <=10 SENSITIVE Sensitive     CLINDAMYCIN RESISTANT Resistant     RIFAMPIN <=0.5 SENSITIVE Sensitive     Inducible Clindamycin POSITIVE Resistant     * STAPHYLOCOCCUS AUREUS  Blood Culture (routine x 2)     Status: Abnormal   Collection Time: 10/22/19  3:31 PM   Specimen: Right Antecubital; Blood  Result Value Ref Range Status   Specimen Description   Final    RIGHT ANTECUBITAL Performed at [facility], [address], [place], [place] [address]    Special Requests   Final    BOTTLES DRAWN AEROBIC AND ANAEROBIC Blood Culture adequate volume Performed at [facility], [address], [place], [place] [address]    Culture  Setup Time   Final    GRAM POSITIVE COCCI ANAEROBIC AND AEROBIC BOTTLES Gram Stain Report Called to,Read Back By and Verified With: HEARN,J@0705  BY MATTHEWS B 12.17.2020 [facility]  [facility] Performed at [facility], [address] [place], [place] [address]    Culture (A)  Final    STAPHYLOCOCCUS AUREUS SUSCEPTIBILITIES PERFORMED ON PREVIOUS CULTURE WITHIN THE LAST 5 DAYS. Performed at [facility], [address] [place], [place] [address]    Report Status 10/26/2019 FINAL  Final  SARS CORONAVIRUS 2 (TAT 6-24 HRS) Nasopharyngeal Nasopharyngeal Swab     Status: None   Collection Time: 10/22/19  4:14 PM   Specimen: Nasopharyngeal Swab  Result Value Ref Range Status   SARS Coronavirus 2 NEGATIVE NEGATIVE Final    Comment: (NOTE) SARS-CoV-2 target nucleic acids are NOT DETECTED. The SARS-CoV-2 RNA is generally detectable in upper and lower respiratory specimens during the acute phase of infection. Negative results do not preclude SARS-CoV-2 infection, do not rule out co-infections with other pathogens, and should not be used as the sole basis for treatment or other patient management decisions. Negative results must be combined with clinical observations, patient history, and epidemiological information. The expected result is Negative. Fact Sheet for Patients: HairSlick.no Fact Sheet for Healthcare Providers: quierodirigir.com This test is not yet approved or cleared by the [place] FDA and  has been authorized for detection and/or diagnosis of SARS-CoV-2 by FDA under an Emergency Use Authorization (EUA). This EUA will remain  in effect (meaning this test can be used) for the duration of the COVID-19 declaration under Section 56 4(b)(1) of the Act, 21 U.S.C. section 360bbb-3(b)(1), unless the authorization is terminated or revoked sooner. Performed at [facility], [address], [place], [place] [address]   MRSA PCR Screening     Status: None   Collection Time: 10/22/19  8:11 PM   Specimen: Nasal Mucosa; Nasopharyngeal  Result Value Ref Range Status   MRSA by PCR NEGATIVE NEGATIVE  Final    Comment:        The GeneXpert MRSA Assay (FDA approved for NASAL specimens only), is one component of a comprehensive MRSA colonization surveillance program. It is not intended to diagnose MRSA infection nor to guide or monitor treatment for MRSA infections. Performed at [facility], [address] [place], [place] [address]   C difficile quick scan w PCR reflex     Status: None   Collection Time: 10/22/19 11:56 PM   Specimen: STOOL  Result Value Ref Range Status   C Diff antigen NEGATIVE NEGATIVE Final   C Diff toxin NEGATIVE NEGATIVE Final   C Diff interpretation No C. difficile detected.  Final    Comment: Performed at [facility], [address], [place], [place] [address]  Culture, blood (routine x 2)     Status: None   Collection Time: 10/23/19  8:44 AM   Specimen: BLOOD RIGHT HAND  Result Value Ref Range  Status   Specimen Description BLOOD RIGHT HAND  Final   Special Requests   Final    BOTTLES DRAWN AEROBIC AND ANAEROBIC Blood Culture adequate volume   Culture   Final    NO GROWTH 5 DAYS Performed at [facility], [address] [place], [place] [address]    Report Status 10/28/2019 FINAL  Final  Culture, blood (routine x 2)     Status: None   Collection Time: 10/23/19  8:46 AM   Specimen: BLOOD RIGHT HAND  Result Value Ref Range Status   Specimen Description BLOOD RIGHT HAND  Final   Special Requests   Final    BOTTLES DRAWN AEROBIC AND ANAEROBIC Blood Culture results may not be optimal due to an excessive volume of blood received in culture bottles   Culture   Final    NO GROWTH 5 DAYS Performed at [facility], [address] [place], [place] [address]    Report Status 10/28/2019 FINAL  Final  Aerobic/Anaerobic Culture (surgical/deep wound)     Status: None   Collection Time: 10/24/19  2:20 PM   Specimen: Soft Tissue, Other  Result Value Ref Range Status   Specimen Description   Final    TOE SOFT TISSUE AND BONE OF 3RD Performed at [facility], [address] [place], [place] [address]    Special Requests   Final    NONE Performed at [facility], [address] [place], [place] [address]    Gram Stain NO WBC SEEN FEW GRAM POSITIVE COCCI   Final   Culture   Final    MODERATE STAPHYLOCOCCUS AUREUS FEW STREPTOCOCCUS ANGINOSIS FEW PREVOTELLA MELANINOGENICA BETA LACTAMASE POSITIVE Performed at [facility], [address] [place], [place] [address]    Report Status 10/28/2019 FINAL  Final   Organism ID, Bacteria STAPHYLOCOCCUS AUREUS  Final   Organism ID, Bacteria STREPTOCOCCUS ANGINOSIS  Final      Susceptibility   Staphylococcus aureus - MIC*    CIPROFLOXACIN >=8 RESISTANT Resistant     ERYTHROMYCIN >=8 RESISTANT Resistant     GENTAMICIN <=0.5 SENSITIVE Sensitive     OXACILLIN 0.5 SENSITIVE Sensitive     TETRACYCLINE >=16 RESISTANT Resistant     VANCOMYCIN <=0.5 SENSITIVE Sensitive     TRIMETH/SULFA <=10 SENSITIVE Sensitive     CLINDAMYCIN RESISTANT Resistant     RIFAMPIN <=0.5 SENSITIVE Sensitive     Inducible Clindamycin POSITIVE Resistant     * MODERATE STAPHYLOCOCCUS AUREUS   Streptococcus anginosis - MIC*    PENICILLIN <=0.06 SENSITIVE Sensitive     CEFTRIAXONE <=0.12 SENSITIVE Sensitive     ERYTHROMYCIN >=8 RESISTANT Resistant     LEVOFLOXACIN <=0.25 SENSITIVE Sensitive     VANCOMYCIN 0.5 SENSITIVE Sensitive     * FEW STREPTOCOCCUS ANGINOSIS  Culture, blood (routine x 2)     Status: Abnormal   Collection Time: 10/24/19  6:33 PM   Specimen: BLOOD RIGHT ARM  Result Value Ref Range Status   Specimen Description   Final    BLOOD RIGHT ARM Performed at [facility], [address] [place], [place] [address]    Special Requests   Final    BOTTLES DRAWN AEROBIC AND ANAEROBIC Blood Culture adequate volume Performed at [facility], [address] [place], [place] [address]    Culture  Setup Time   Final    IN BOTH AEROBIC AND ANAEROBIC BOTTLES CORRECTED ON 12/22 AT 0140: PREVIOUSLY REPORTED  AS GRAM POSITIVE COCCI ANAEROBIC  BOTTLE ONLY Gram Stain Report Called to,Read Back By and Verified With: HYLTON 1238 ON [id number] BY HENDERSON L. CRITICAL RESULT CALLED TO, READ BACK BY AND VERIFIED WITH: E. MURPHY, RN ([facility]) AT 1820 ON 10/25/19 BY C. JESSUP, MT.    Culture (A)  Final    STAPHYLOCOCCUS AUREUS SUSCEPTIBILITIES PERFORMED ON PREVIOUS CULTURE WITHIN THE LAST 5 DAYS. Performed at [facility], [address], [place], [place] [address]    Report Status 10/27/2019 FINAL  Final  Blood Culture ID Panel (Reflexed)     Status: Abnormal   Collection Time: 10/24/19  6:33 PM  Result Value Ref Range Status   Enterococcus species NOT DETECTED NOT DETECTED Final   Listeria monocytogenes NOT DETECTED NOT DETECTED Final   Staphylococcus species DETECTED (A) NOT DETECTED Final    Comment: CRITICAL RESULT CALLED TO, READ BACK BY AND VERIFIED WITH: E. MURPHY, RN (APH) AT 1820 ON 10/25/19 BY C. JESSUP, MT.    Staphylococcus aureus (BCID) DETECTED (A) NOT DETECTED Final    Comment: Methicillin (oxacillin) susceptible Staphylococcus aureus (MSSA). Preferred therapy is anti staphylococcal beta lactam antibiotic (Cefazolin or Nafcillin), unless clinically contraindicated. CRITICAL RESULT CALLED TO, READ BACK BY AND VERIFIED WITH: E. MURPHY, RN (APH) AT 1820 ON 10/25/19 BY C. JESSUP, MT.    Methicillin resistance NOT DETECTED NOT DETECTED Final   Streptococcus species NOT DETECTED NOT DETECTED Final   Streptococcus agalactiae NOT DETECTED NOT DETECTED Final   Streptococcus pneumoniae NOT DETECTED NOT DETECTED Final   Streptococcus pyogenes NOT DETECTED NOT DETECTED Final   Acinetobacter baumannii NOT DETECTED NOT DETECTED Final   Enterobacteriaceae species NOT DETECTED NOT DETECTED Final   Enterobacter cloacae complex NOT DETECTED NOT DETECTED Final   Escherichia coli NOT DETECTED NOT DETECTED Final   Klebsiella oxytoca NOT DETECTED NOT DETECTED Final   Klebsiella pneumoniae NOT DETECTED  NOT DETECTED Final   Proteus species NOT DETECTED NOT DETECTED Final   Serratia marcescens NOT DETECTED NOT DETECTED Final   Haemophilus influenzae NOT DETECTED NOT DETECTED Final   Neisseria meningitidis NOT DETECTED NOT DETECTED Final   Pseudomonas aeruginosa NOT DETECTED NOT DETECTED Final   Candida albicans NOT DETECTED NOT DETECTED Final   Candida glabrata NOT DETECTED NOT DETECTED Final   Candida krusei NOT DETECTED NOT DETECTED Final   Candida parapsilosis NOT DETECTED NOT DETECTED Final   Candida tropicalis NOT DETECTED NOT DETECTED Final    Comment: Performed at [facility], [address], [place], [place] [address]  Culture, blood (routine x 2)     Status: Abnormal   Collection Time: 10/24/19  6:38 PM   Specimen: BLOOD RIGHT HAND  Result Value Ref Range Status   Specimen Description   Final    BLOOD RIGHT HAND Performed at [facility], [address] [place], [place] [address]    Special Requests   Final    BOTTLES DRAWN AEROBIC AND ANAEROBIC Blood Culture adequate volume Performed at [facility], [address], [place], [place] [address]    Culture  Setup Time   Final    GRAM POSITIVE COCCI ANAEROBIC BOTTLE ONLY Gram Stain Report Called to,Read Back By and Verified With: HYLTON @ 1238 ON [id number] BY HENDERSON L. CRITICAL VALUE NOTED.  VALUE IS CONSISTENT WITH PREVIOUSLY REPORTED AND CALLED VALUE.    Culture (A)  Final    STAPHYLOCOCCUS AUREUS SUSCEPTIBILITIES PERFORMED ON PREVIOUS CULTURE WITHIN THE LAST 5 DAYS. Performed at [facility], [address] [place],  [place] [address]    Report Status 10/27/2019 FINAL  Final  Culture, blood (routine x 2)     Status: None (Preliminary result)   Collection Time: 10/25/19  2:38 PM   Specimen: Right Antecubital; Blood  Result Value Ref Range Status   Specimen Description   Final    RIGHT ANTECUBITAL BOTTLES DRAWN AEROBIC AND ANAEROBIC   Special Requests Blood Culture adequate volume  Final   Culture    Final    NO GROWTH 4 DAYS Performed at [facility], [address], [place], [place] [address]    Report Status PENDING  Incomplete  Culture, blood (routine x 2)     Status: None (Preliminary result)   Collection Time: 10/26/19 11:17 AM   Specimen: BLOOD RIGHT ARM  Result Value Ref Range Status   Specimen Description BLOOD RIGHT ARM  Final   Special Requests   Final    BOTTLES DRAWN AEROBIC AND ANAEROBIC Blood Culture adequate volume   Culture   Final    NO GROWTH 3 DAYS Performed at [facility], [address] [place], [place] [address]    Report Status PENDING  Incomplete  Culture, blood (routine x 2)     Status: None (Preliminary result)   Collection Time: 10/26/19 11:17 AM   Specimen: BLOOD LEFT HAND  Result Value Ref Range Status   Specimen Description BLOOD LEFT HAND  Final   Special Requests   Final    BOTTLES DRAWN AEROBIC ONLY Blood Culture adequate volume   Culture   Final    NO GROWTH 3 DAYS Performed at [facility], [address] [place], [place] [address]    Report Status PENDING  Incomplete     US Venous Img Upper Uni Left (DVT)  Result Date: 10/29/2019 CLINICAL DATA:  Left upper extremity pain and swelling for the past 4 days. Prior history of lower extremity DVT EXAM: LEFT UPPER EXTREMITY VENOUS DOPPLER ULTRASOUND TECHNIQUE: Gray-scale sonography with graded compression, as well as color Doppler and duplex ultrasound were performed to evaluate the upper extremity deep venous system from the level of the subclavian vein and including the jugular, axillary, basilic, radial, ulnar and upper cephalic vein. Spectral Doppler was utilized to evaluate flow at rest and with distal augmentation maneuvers. COMPARISON:  None. FINDINGS: Contralateral Subclavian Vein: Respiratory phasicity is normal and symmetric with the symptomatic side. No evidence of thrombus. Normal compressibility. Internal Jugular Vein: No evidence of thrombus. Normal compressibility, respiratory  phasicity and response to augmentation. Subclavian Vein: No evidence of thrombus. Normal compressibility, respiratory phasicity and response to augmentation. Axillary Vein: No evidence of thrombus. Normal compressibility, respiratory phasicity and response to augmentation. Cephalic Vein: Abnormal and noncompressible. The lumen is expanded and filled with low-level internal echoes. Trace flow visible on color Doppler imaging. Findings are consistent with acute to subacute thrombus within the cephalic vein beginning in the proximal forearm and extending into the distal upper arm. Basilic Vein: No evidence of thrombus. Normal compressibility, respiratory phasicity and response to augmentation. Brachial Veins: No evidence of thrombus. Normal compressibility, respiratory phasicity and response to augmentation. Radial Veins: No evidence of thrombus. Normal compressibility, respiratory phasicity and response to augmentation. Ulnar Veins: No evidence of thrombus. Normal compressibility, respiratory phasicity and response to augmentation. Venous Reflux:  None visualized. Other Findings:  Superficial subcutaneous edema. IMPRESSION: 1. Positive for superficial venous thrombophlebitis involving the cephalic vein from the distal upper arm into the mid forearm. 2. No evidence of deep venous thrombosis in the left upper  extremity. Electronically Signed   By: Malachy Moan M.D.   On: 10/29/2019 15:15    Medications Scheduled Meds: . amiodarone  200 mg Oral BID  . apixaban  10 mg Oral BID   Followed by  . [START ON 11/04/2019] apixaban  5 mg Oral BID  . Chlorhexidine Gluconate Cloth  6 each Topical Q0600  . diltiazem  120 mg Oral Daily  . insulin aspart  0-15 Units Subcutaneous TID WC  . insulin aspart  0-5 Units Subcutaneous QHS  . insulin glargine  30 Units Subcutaneous QHS  . sodium chloride flush  3 mL Intravenous Q12H   Continuous Infusions: . ampicillin-sulbactam (UNASYN) IV 3 g (10/29/19 0836)   PRN  Meds:.acetaminophen **OR** acetaminophen, fentaNYL (SUBLIMAZE) injection, fluticasone, promethazine  Assessment 1. 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 2. DVT, RLE  Plan Changed packing of R foot. Applied dressing to R foot. ContinuePrevalon boot to RLE. Continue NWB RLE. Continue antibiotics per ID. DVT treatment per IM. Will need home health for dressing changes and monitor of surgical wound.  Packing should be changed every day.  I would like to see him 1 week following discharge. I will continue to see him daily while inpatient.  Marius Ditch 10/29/2019, 3:38 PM

## 2019-10-29 NOTE — Anesthesia Preprocedure Evaluation (Signed)
Anesthesia Evaluation  Patient identified by MRN, date of birth, ID band Patient awake    Reviewed: Allergy & Precautions, NPO status , Patient's Chart, lab work & pertinent test results, reviewed documented beta blocker date and time   Airway Mallampati: I  TM Distance: >3 FB Neck ROM: Full    Dental no notable dental hx. (+) Teeth Intact   Pulmonary neg pulmonary ROS,    Pulmonary exam normal breath sounds clear to auscultation       Cardiovascular Exercise Tolerance: Good hypertension, Pt. on medications Normal cardiovascular exam+ dysrhythmias Atrial Fibrillation I Rhythm:Regular Rate:Normal  Recent Afib with RVR -now sinus  Had bad infection -here for TEE to R/O vegetations  Appears improved from last OR visit for I&D 3rd toe   Neuro/Psych negative neurological ROS  negative psych ROS   GI/Hepatic negative GI ROS, Neg liver ROS,   Endo/Other  negative endocrine ROSdiabetes, Type 1, Insulin Dependent  Renal/GU negative Renal ROS  negative genitourinary   Musculoskeletal  (+) Arthritis , Osteoarthritis,    Abdominal   Peds negative pediatric ROS (+)  Hematology negative hematology ROS (+)   Anesthesia Other Findings   Reproductive/Obstetrics negative OB ROS                             Anesthesia Physical Anesthesia Plan  ASA: III  Anesthesia Plan: General   Post-op Pain Management:    Induction: Intravenous  PONV Risk Score and Plan: 2 and Propofol infusion, TIVA and Treatment may vary due to age or medical condition  Airway Management Planned: Nasal Cannula and Simple Face Mask  Additional Equipment:   Intra-op Plan:   Post-operative Plan:   Informed Consent: I have reviewed the patients History and Physical, chart, labs and discussed the procedure including the risks, benefits and alternatives for the proposed anesthesia with the patient or authorized representative  who has indicated his/her understanding and acceptance.     Dental advisory given  Plan Discussed with: CRNA  Anesthesia Plan Comments: (Plan Full PPE use  Plan GA with GETA as needed d/w pt -WTP with same after Q&A)        Anesthesia Quick Evaluation

## 2019-10-29 NOTE — Progress Notes (Signed)
*  PRELIMINARY RESULTS* Echocardiogram Echocardiogram Transesophageal has been performed.  Leavy Cella 10/29/2019, 1:03 PM

## 2019-10-30 DIAGNOSIS — M14671 Charcot's joint, right ankle and foot: Secondary | ICD-10-CM

## 2019-10-30 DIAGNOSIS — B9561 Methicillin susceptible Staphylococcus aureus infection as the cause of diseases classified elsewhere: Secondary | ICD-10-CM

## 2019-10-30 LAB — CBC
HCT: 35.2 % — ABNORMAL LOW (ref 39.0–52.0)
Hemoglobin: 11.6 g/dL — ABNORMAL LOW (ref 13.0–17.0)
MCH: 29.8 pg (ref 26.0–34.0)
MCHC: 33 g/dL (ref 30.0–36.0)
MCV: 90.5 fL (ref 80.0–100.0)
Platelets: 299 10*3/uL (ref 150–400)
RBC: 3.89 MIL/uL — ABNORMAL LOW (ref 4.22–5.81)
RDW: 12.8 % (ref 11.5–15.5)
WBC: 17.2 10*3/uL — ABNORMAL HIGH (ref 4.0–10.5)
nRBC: 0 % (ref 0.0–0.2)

## 2019-10-30 LAB — GLUCOSE, CAPILLARY
Glucose-Capillary: 198 mg/dL — ABNORMAL HIGH (ref 70–99)
Glucose-Capillary: 204 mg/dL — ABNORMAL HIGH (ref 70–99)

## 2019-10-30 MED ORDER — CEFAZOLIN IV (FOR PTA / DISCHARGE USE ONLY): 2.0000 g | Freq: Three times a day (TID) | INTRAVENOUS | 0 refills | Status: DC

## 2019-10-30 MED ORDER — DILTIAZEM HCL ER COATED BEADS 120 MG PO CP24: 120.0000 mg | ORAL_CAPSULE | Freq: Every day | ORAL | 0 refills | Status: DC

## 2019-10-30 MED ORDER — APIXABAN 5 MG PO TABS: ORAL_TABLET | ORAL | 1 refills | Status: DC

## 2019-10-30 MED ORDER — METRONIDAZOLE 500 MG PO TABS: 500.0000 mg | ORAL_TABLET | Freq: Three times a day (TID) | ORAL | 0 refills | Status: DC

## 2019-10-30 MED ORDER — AMIODARONE HCL 200 MG PO TABS: 200.0000 mg | ORAL_TABLET | Freq: Two times a day (BID) | ORAL | 0 refills | Status: DC

## 2019-10-30 MED ORDER — SODIUM CHLORIDE 0.9% FLUSH: 10.0000 mL | INTRAVENOUS | Status: DC | PRN

## 2019-10-30 MED ORDER — SODIUM CHLORIDE 0.9% FLUSH
10.0000 mL | Freq: Two times a day (BID) | INTRAVENOUS | Status: DC
  Administered 2019-10-30: 10 mL

## 2019-10-30 NOTE — TOC Transition Note (Signed)
Transition of Care [facility]) - CM/SW Discharge Note   Patient Details  Name: [name] MRN: [id number] Date of Birth: Nov 21, 1965  [facility] CM/SW Contact:  Shade Flood, LCSW Phone Number: 10/30/2019, 1:30 PM   Clinical Narrative:    Pt stable for dc today per MD. Plan remains for dc home with [facility] and RN [facility] from [facility]. Updated [place] with [facility]. There are no other TOC needs for dc.    Final next level of care: [place] Barriers to Discharge: Barriers Resolved   Patient Goals and CMS Choice        Discharge Placement                       Discharge Plan and Services                          HH Arranged: RN, IV Antibiotics HH Agency: [place] ([facility]) Date [facility] Agency Contacted: 10/30/19   Representative spoke with at [place]: [place] and Pam at [facility]  Social Determinants of Health ([place]) Interventions     Readmission Risk Interventions No flowsheet data found.

## 2019-10-30 NOTE — Discharge Instructions (Signed)
Atrial Fibrillation Atrial fibrillation is a type of irregular or rapid heartbeat (arrhythmia). In atrial fibrillation, the top part of the heart (atria) quivers in a chaotic pattern. This makes the heart unable to pump blood normally. Having atrial fibrillation can increase your risk for other health problems, such as:  Blood can pool in the atria and form clots. If a clot travels to the brain, it can cause a stroke.  The heart muscle may weaken from the irregular blood flow. This can cause heart failure. Atrial fibrillation may start suddenly and stop on its own, or it may become a long-lasting problem. What are the causes? This condition is caused by some heart-related conditions or procedures, including:  High blood pressure. This is the most common cause.  Heart failure.  Heart valve conditions.  Inflammation of the sac that surrounds the heart (pericarditis).  Heart surgery.  Coronary artery disease.  Certain heart rhythm disorders, such as Wolf-Parkinson-White syndrome. Other causes include:  Pneumonia.  Obstructive sleep apnea.  Lung cancer.  Thyroid problems, especially if the thyroid is overactive (hyperthyroidism).  Excessive alcohol or drug use. Sometimes, the cause of this condition is not known. What increases the risk? This condition is more likely to develop in:  Older people.  People who smoke.  People who have diabetes mellitus.  People who are overweight (obese).  Athletes who exercise vigorously.  People who have a family history. What are the signs or symptoms? Symptoms of this condition include:  A feeling that your heart is beating rapidly or irregularly.  A feeling of discomfort or pain in your chest.  Shortness of breath.  Sudden light-headedness or weakness.  Getting tired easily during exercise. In some cases, there are no symptoms. How is this diagnosed? Your health care provider may be able to detect atrial fibrillation  when taking your pulse. If detected, this condition may be diagnosed with:  Electrocardiogram (ECG).  Ambulatory cardiac monitor. This device records your heartbeats for 24 hours or more.  Transthoracic echocardiogram (TTE) to evaluate how blood flows through your heart.  Transesophageal echocardiogram (TEE) to view more detailed images of your heart.  A stress test.  Imaging tests, such as a CT scan or chest X-ray.  Blood tests. How is this treated? This condition may be treated with:  Medicines to slow down the heart rate or bring the heart's rhythm back to normal.  Medicines to prevent blood clots from forming.  Electrical cardioversion. This delivers a low-energy shock to the heart to reset its rhythm.  Ablation. This procedure destroys the part of the heart tissue that sends abnormal signals.  Left atrial appendage occlusion/excision. This seals off a common place in the atria where blood clots can form (left atrial appendage). The goal of treatment is to prevent blood clots from forming and to keep your heart beating at a normal rate and rhythm. Treatment depends on underlying medical conditions and how you feel when you are experiencing fibrillation. Follow these instructions at home: Medicines  Take over-the counter and prescription medicines only as told by your health care provider.  If your health care provider prescribed a blood-thinning medicine (anticoagulant), take it exactly as told. Taking too much blood-thinning medicine can cause bleeding. Taking too little can enable a blood clot to form and travel to the brain, causing a stroke. Lifestyle      Do not use any products that contain nicotine or tobacco, such as cigarettes and e-cigarettes. If you need help quitting, ask your  health care provider.  Do not drink beverages that contain caffeine, such as coffee, soda, and tea.  Follow diet instructions as told by your health care provider.  Exercise  regularly as told by your health care provider.  Do not drink alcohol. General instructions  If you have obstructive sleep apnea, manage your condition as told by your health care provider.  Maintain a healthy weight. Do not use diet pills unless your health care provider approves. Diet pills may make heart problems worse.  Keep all follow-up visits as told by your health care provider. This is important. Contact a health care provider if you:  Notice a change in the rate, rhythm, or strength of your heartbeat.  Are taking an anticoagulant and you notice increased bruising.  Tire more easily when you exercise or exert yourself.  Have a sudden change in weight. Get help right away if you have:   Chest pain, abdominal pain, sweating, or weakness.  Difficulty breathing.  Blood in your vomit, stool (feces), or urine.  Any symptoms of a stroke. "BE FAST" is an easy way to remember the main warning signs of a stroke: ? B - Balance. Signs are dizziness, sudden trouble walking, or loss of balance. ? E - Eyes. Signs are trouble seeing or a sudden change in vision. ? F - Face. Signs are sudden weakness or numbness of the face, or the face or eyelid drooping on one side. ? A - Arms. Signs are weakness or numbness in an arm. This happens suddenly and usually on one side of the body. ? S - Speech. Signs are sudden trouble speaking, slurred speech, or trouble understanding what people say. ? T - Time. Time to call emergency services. Write down what time symptoms started.  Other signs of a stroke, such as: ? A sudden, severe headache with no known cause. ? Nausea or vomiting. ? Seizure. These symptoms may represent a serious problem that is an emergency. Do not wait to see if the symptoms will go away. Get medical help right away. Call your local emergency services (911 in the [place] Do not drive yourself to the hospital. Summary  Atrial fibrillation is a type of irregular or rapid  heartbeat (arrhythmia).  Symptoms include a feeling that your heart is beating fast or irregularly. In some cases, you may not have symptoms.  The condition is treated with medicines to slow down the heart rate or bring the heart's rhythm back to normal. You may also need blood-thinning medicines to prevent blood clots.  Get help right away if you have symptoms or signs of a stroke. This information is not intended to replace advice given to you by your health care provider. Make sure you discuss any questions you have with your health care provider. Document Released: 10/23/2005 Document Revised: 12/13/2017 Document Reviewed: 12/14/2017 Elsevier Patient Education  2020 [place] on my medicine - ELIQUIS (apixaban)  This medication education was reviewed with me or my healthcare representative as part of my discharge preparation.  The pharmacist that spoke with me during my hospital stay was:  Ramond Craver, [facility]  Why was Eliquis prescribed for you? Eliquis was prescribed to treat blood clots that may have been found in the veins of your legs (deep vein thrombosis) or in your lungs (pulmonary embolism) and to reduce the risk of them occurring again.  What do You need to know about Eliquis ? The starting dose is 10 mg (two 5 mg tablets) taken TWICE  daily for the FIRST SEVEN (7) DAYS, then on (enter date)  11/04/2019  the dose is reduced to ONE 5 mg tablet taken TWICE daily.  Eliquis may be taken with or without food.   Try to take the dose about the same time in the morning and in the evening. If you have difficulty swallowing the tablet whole please discuss with your pharmacist how to take the medication safely.  Take Eliquis exactly as prescribed and DO NOT stop taking Eliquis without talking to the doctor who prescribed the medication.  Stopping may increase your risk of developing a new blood clot.  Refill your prescription before you run out.  After discharge, you  should have regular check-up appointments with your healthcare provider that is prescribing your Eliquis.    What do you do if you miss a dose? If a dose of ELIQUIS is not taken at the scheduled time, take it as soon as possible on the same day and twice-daily administration should be resumed. The dose should not be doubled to make up for a missed dose.  Important Safety Information A possible side effect of Eliquis is bleeding. You should call your healthcare provider right away if you experience any of the following: ? Bleeding from an injury or your nose that does not stop. ? Unusual colored urine (red or dark brown) or unusual colored stools (red or black). ? Unusual bruising for unknown reasons. ? A serious fall or if you hit your head (even if there is no bleeding).  Some medicines may interact with Eliquis and might increase your risk of bleeding or clotting while on Eliquis. To help avoid this, consult your healthcare provider or pharmacist prior to using any new prescription or non-prescription medications, including herbals, vitamins, non-steroidal anti-inflammatory drugs (NSAIDs) and supplements.  This website has more information on Eliquis (apixaban): http://[email or web address].[email or web address]

## 2019-10-30 NOTE — Progress Notes (Signed)
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 Denies any nausea, vomiting, shortness of breath or chest pain.No foot/ankle pain.  PICC line has been placed.   Objective Vital signs in last 24 hours:   Temp:  [97.8 F (36.6 C)-100.2 F (37.9 C)] 98.6 F (37 C) (12/24 0547) Pulse Rate:  [70-83] 82 (12/24 0547) Resp:  [16-23] 20 (12/24 0547) BP: (128-150)/(68-79) 136/68 (12/24 0547) SpO2:  [96 %-99 %] 97 % (12/24 0750) Weight:  [103.1 kg] 103.1 kg (12/24 0500)  DRESSING(S): Intactwithout strikethrough. Heel protective bootnotin place. INTEGUMENT: Skin edges at amputation siteremainviable. Wound bed viable with no necrosis present. Mild erythema of foot and ankle.Significant improvement from admission.  No skin changes along the posterior of the right heel. VASCULAR: Pedal pulses are palpable.Edema of theRLE continues to improve. MUSCULOSKELETAL:Rcalf is soft and nontender.Charcot deformity of R foot. This is not a new finding for this patient. R ankle remainsNTTP.   Lab/Test Results  Recent Labs    10/28/19 0353 10/29/19 0600 10/30/19 0553  WBC 19.6* 18.7* 17.2*  HGB 12.7* 12.3* 11.6*  HCT 38.7* 36.5* 35.2*  PLT 308 295 299  NA 132* 129*  --   K 3.8 4.0  --   CL 91* 91*  --   CO2 29 29  --   BUN 16 16  --   CREATININE 0.66 0.81  --   GLUCOSE 112* 184*  --   CALCIUM 7.5* 7.5*  --     Recent Results (from the past 240 hour(s))  Urine culture     Status: Abnormal   Collection Time: 10/22/19  3:08 PM   Specimen: In/Out Cath Urine  Result Value Ref Range Status   Specimen Description   Final    IN/OUT CATH URINE Performed at [facility], [address], [place], [place] [address]    Special Requests   Final    NONE Performed at [facility], [address] [place], [place] [address]    Culture (A)  Final    <10,000 COLONIES/mL INSIGNIFICANT GROWTH Performed at [facility], [address], [place], [place]  [address]    Report Status 10/23/2019 FINAL  Final  Blood Culture (routine x 2)     Status: Abnormal   Collection Time: 10/22/19  3:20 PM   Specimen: BLOOD LEFT WRIST  Result Value Ref Range Status   Specimen Description   Final    BLOOD LEFT WRIST Performed at [facility], [address] [place], [place] [address]    Special Requests   Final    BOTTLES DRAWN AEROBIC AND ANAEROBIC Blood Culture adequate volume Performed at [facility], [address] [place], [place] [address]    Culture  Setup Time   Final    GRAM POSITIVE COCCI BOTH ANAEROBIC AND AEROBIC BOTTLES Gram Stain Report Called to,Read Back By and Verified With: HEARN,J@0705  BY MATTHEWS, B 12.17.2020 [name] Performed at [facility], [address], [place], [place] [address]    Culture STAPHYLOCOCCUS AUREUS (A)  Final   Report Status 10/26/2019 FINAL  Final   Organism ID, Bacteria STAPHYLOCOCCUS AUREUS  Final      Susceptibility   Staphylococcus aureus - MIC*    CIPROFLOXACIN >=8 RESISTANT Resistant     ERYTHROMYCIN >=8 RESISTANT Resistant     GENTAMICIN <=0.5 SENSITIVE Sensitive     OXACILLIN 0.5 SENSITIVE Sensitive     TETRACYCLINE >=16 RESISTANT Resistant     VANCOMYCIN 1 SENSITIVE Sensitive  TRIMETH/SULFA <=10 SENSITIVE Sensitive     CLINDAMYCIN RESISTANT Resistant     RIFAMPIN <=0.5 SENSITIVE Sensitive     Inducible Clindamycin POSITIVE Resistant     * STAPHYLOCOCCUS AUREUS  Blood Culture (routine x 2)     Status: Abnormal   Collection Time: 10/22/19  3:31 PM   Specimen: Right Antecubital; Blood  Result Value Ref Range Status   Specimen Description   Final    RIGHT ANTECUBITAL Performed at [facility], [address] [place], [place] [address]    Special Requests   Final    BOTTLES DRAWN AEROBIC AND ANAEROBIC Blood Culture adequate volume Performed at [facility], [address] [place], [place] [address]    Culture  Setup Time   Final    GRAM POSITIVE COCCI ANAEROBIC AND AEROBIC  BOTTLES Gram Stain Report Called to,Read Back By and Verified With: HEARN,J@0705  BY MATTHEWS B 12.17.2020 [facility] Performed at [facility], [address] [place], [place] [address]    Culture (A)  Final    STAPHYLOCOCCUS AUREUS SUSCEPTIBILITIES PERFORMED ON PREVIOUS CULTURE WITHIN THE LAST 5 DAYS. Performed at [facility], [address] [place], [place] [address]    Report Status 10/26/2019 FINAL  Final  SARS CORONAVIRUS 2 (TAT 6-24 HRS) Nasopharyngeal Nasopharyngeal Swab     Status: None   Collection Time: 10/22/19  4:14 PM   Specimen: Nasopharyngeal Swab  Result Value Ref Range Status   SARS Coronavirus 2 NEGATIVE NEGATIVE Final    Comment: (NOTE) SARS-CoV-2 target nucleic acids are NOT DETECTED. The SARS-CoV-2 RNA is generally detectable in upper and lower respiratory specimens during the acute phase of infection. Negative results do not preclude SARS-CoV-2 infection, do not rule out co-infections with other pathogens, and should not be used as the sole basis for treatment or other patient management decisions. Negative results must be combined with clinical observations, patient history, and epidemiological information. The expected result is Negative. Fact Sheet for Patients: HairSlick.nohttps://www.fda.gov/media/138098/download Fact Sheet for Healthcare Providers: quierodirigir.comhttps://www.fda.gov/media/138095/download This test is not yet approved or cleared by the [place] FDA and  has been authorized for detection and/or diagnosis of SARS-CoV-2 by FDA under an Emergency Use Authorization (EUA). This EUA will remain  in effect (meaning this test can be used) for the duration of the COVID-19 declaration under Section 56 4(b)(1) of the Act, 21 U.S.C. section 360bbb-3(b)(1), unless the authorization is terminated or revoked sooner. Performed at [facility], [address] [place], [place] [address]   MRSA PCR Screening     Status: None   Collection Time: 10/22/19   8:11 PM   Specimen: Nasal Mucosa; Nasopharyngeal  Result Value Ref Range Status   MRSA by PCR NEGATIVE NEGATIVE Final    Comment:        The GeneXpert MRSA Assay (FDA approved for NASAL specimens only), is one component of a comprehensive MRSA colonization surveillance program. It is not intended to diagnose MRSA infection nor to guide or monitor treatment for MRSA infections. Performed at [facility], [address] [place], [place] [address]   C difficile quick scan w PCR reflex     Status: None   Collection Time: 10/22/19 11:56 PM   Specimen: STOOL  Result Value Ref Range Status   C Diff antigen NEGATIVE NEGATIVE Final   C Diff toxin NEGATIVE NEGATIVE Final   C Diff interpretation No C. difficile detected.  Final    Comment: Performed at [facility], [address] [place] [address] [place], [place] [address]  Culture, blood (routine x 2)     Status: None   Collection Time: 10/23/19  8:44 AM   Specimen: BLOOD RIGHT HAND  Result Value Ref Range Status   Specimen Description BLOOD RIGHT HAND  Final   Special Requests   Final    BOTTLES DRAWN AEROBIC AND ANAEROBIC Blood Culture adequate volume   Culture   Final    NO GROWTH 5 DAYS Performed at [facility], [address], [place], [place] [address]    Report Status 10/28/2019 FINAL  Final  Culture, blood (routine x 2)     Status: None   Collection Time: 10/23/19  8:46 AM   Specimen: BLOOD RIGHT HAND  Result Value Ref Range Status   Specimen Description BLOOD RIGHT HAND  Final   Special Requests   Final    BOTTLES DRAWN AEROBIC AND ANAEROBIC Blood Culture results may not be optimal due to an excessive volume of blood received in culture bottles   Culture   Final    NO GROWTH 5 DAYS Performed at [facility], [address] [place], [place] [address]    Report Status 10/28/2019 FINAL  Final  Aerobic/Anaerobic Culture (surgical/deep wound)     Status: None   Collection Time: 10/24/19  2:20 PM   Specimen: Soft Tissue, Other  Result  Value Ref Range Status   Specimen Description   Final    TOE SOFT TISSUE AND BONE OF 3RD Performed at [facility], [address] [place], [place] [address]    Special Requests   Final    NONE Performed at [facility], [address], [place], [place] [address]    Gram Stain NO WBC SEEN FEW GRAM POSITIVE COCCI   Final   Culture   Final    MODERATE STAPHYLOCOCCUS AUREUS FEW STREPTOCOCCUS ANGINOSIS FEW PREVOTELLA MELANINOGENICA BETA LACTAMASE POSITIVE Performed at [facility], [address], [place], [place] [address]    Report Status 10/28/2019 FINAL  Final   Organism ID, Bacteria STAPHYLOCOCCUS AUREUS  Final   Organism ID, Bacteria STREPTOCOCCUS ANGINOSIS  Final      Susceptibility   Staphylococcus aureus - MIC*    CIPROFLOXACIN >=8 RESISTANT Resistant     ERYTHROMYCIN >=8 RESISTANT Resistant     GENTAMICIN <=0.5 SENSITIVE Sensitive     OXACILLIN 0.5 SENSITIVE Sensitive     TETRACYCLINE >=16 RESISTANT Resistant     VANCOMYCIN <=0.5 SENSITIVE Sensitive     TRIMETH/SULFA <=10 SENSITIVE Sensitive     CLINDAMYCIN RESISTANT Resistant     RIFAMPIN <=0.5 SENSITIVE Sensitive     Inducible Clindamycin POSITIVE Resistant     * MODERATE STAPHYLOCOCCUS AUREUS   Streptococcus anginosis - MIC*    PENICILLIN <=0.06 SENSITIVE Sensitive     CEFTRIAXONE <=0.12 SENSITIVE Sensitive     ERYTHROMYCIN >=8 RESISTANT Resistant     LEVOFLOXACIN <=0.25 SENSITIVE Sensitive     VANCOMYCIN 0.5 SENSITIVE Sensitive     * FEW STREPTOCOCCUS ANGINOSIS  Culture, blood (routine x 2)     Status: Abnormal   Collection Time: 10/24/19  6:33 PM   Specimen: BLOOD RIGHT ARM  Result Value Ref Range Status   Specimen Description   Final    BLOOD RIGHT ARM Performed at [facility], [address] [place], [place] [address]    Special Requests   Final    BOTTLES DRAWN AEROBIC AND ANAEROBIC Blood Culture adequate volume Performed at [facility], [address]  [place], [place] [address]     Culture  Setup Time   Final    IN BOTH AEROBIC AND ANAEROBIC BOTTLES CORRECTED ON 12/22 AT 0140: PREVIOUSLY REPORTED AS GRAM POSITIVE COCCI ANAEROBIC BOTTLE ONLY Gram Stain Report Called to,Read Back By and Verified With: [id number] ON [id number] BY HENDERSON L. CRITICAL RESULT CALLED TO, READ BACK BY AND VERIFIED WITH: E. MURPHY, RN (APH) AT 1820 ON 10/25/19 BY C. JESSUP, MT.    Culture (A)  Final    STAPHYLOCOCCUS AUREUS SUSCEPTIBILITIES PERFORMED ON PREVIOUS CULTURE WITHIN THE LAST 5 DAYS. Performed at [facility], [place] [address], [place], [place] [address]    Report Status 10/27/2019 FINAL  Final  Blood Culture ID Panel (Reflexed)     Status: Abnormal   Collection Time: 10/24/19  6:33 PM  Result Value Ref Range Status   Enterococcus species NOT DETECTED NOT DETECTED Final   Listeria monocytogenes NOT DETECTED NOT DETECTED Final   Staphylococcus species DETECTED (A) NOT DETECTED Final    Comment: CRITICAL RESULT CALLED TO, READ BACK BY AND VERIFIED WITH: E. MURPHY, RN (APH) AT 1820 ON 10/25/19 BY C. JESSUP, MT.    Staphylococcus aureus (BCID) DETECTED (A) NOT DETECTED Final    Comment: Methicillin (oxacillin) susceptible Staphylococcus aureus (MSSA). Preferred therapy is anti staphylococcal beta lactam antibiotic (Cefazolin or Nafcillin), unless clinically contraindicated. CRITICAL RESULT CALLED TO, READ BACK BY AND VERIFIED WITH: E. MURPHY, RN (APH) AT 6301 ON 10/25/19 BY C. JESSUP, MT.    Methicillin resistance NOT DETECTED NOT DETECTED Final   Streptococcus species NOT DETECTED NOT DETECTED Final   Streptococcus agalactiae NOT DETECTED NOT DETECTED Final   Streptococcus pneumoniae NOT DETECTED NOT DETECTED Final   Streptococcus pyogenes NOT DETECTED NOT DETECTED Final   Acinetobacter baumannii NOT DETECTED NOT DETECTED Final   Enterobacteriaceae species NOT DETECTED NOT DETECTED Final   Enterobacter cloacae complex NOT DETECTED NOT DETECTED Final   Escherichia coli NOT  DETECTED NOT DETECTED Final   Klebsiella oxytoca NOT DETECTED NOT DETECTED Final   Klebsiella pneumoniae NOT DETECTED NOT DETECTED Final   Proteus species NOT DETECTED NOT DETECTED Final   Serratia marcescens NOT DETECTED NOT DETECTED Final   Haemophilus influenzae NOT DETECTED NOT DETECTED Final   Neisseria meningitidis NOT DETECTED NOT DETECTED Final   Pseudomonas aeruginosa NOT DETECTED NOT DETECTED Final   Candida albicans NOT DETECTED NOT DETECTED Final   Candida glabrata NOT DETECTED NOT DETECTED Final   Candida krusei NOT DETECTED NOT DETECTED Final   Candida parapsilosis NOT DETECTED NOT DETECTED Final   Candida tropicalis NOT DETECTED NOT DETECTED Final    Comment: Performed at [facility], [place] [address], [place], [place] [address]  Culture, blood (routine x 2)     Status: Abnormal   Collection Time: 10/24/19  6:38 PM   Specimen: BLOOD RIGHT HAND  Result Value Ref Range Status   Specimen Description   Final    BLOOD RIGHT HAND Performed at [facility], [address] [place], [place] [address]    Special Requests   Final    BOTTLES DRAWN AEROBIC AND ANAEROBIC Blood Culture adequate volume Performed at [facility], [address], [place], [place] [address]    Culture  Setup Time   Final    GRAM POSITIVE COCCI ANAEROBIC BOTTLE ONLY Gram Stain Report Called to,Read Back By and Verified With: HYLTON @ 2542 ON [id number] BY HENDERSON L. CRITICAL VALUE NOTED.  VALUE IS CONSISTENT WITH PREVIOUSLY REPORTED AND  CALLED VALUE.    Culture (A)  Final    STAPHYLOCOCCUS AUREUS SUSCEPTIBILITIES PERFORMED ON PREVIOUS CULTURE WITHIN THE LAST 5 DAYS. Performed at [facility], [address], [place], [place] [address]    Report Status 10/27/2019 FINAL  Final  Culture, blood (routine x 2)     Status: None   Collection Time: 10/25/19  2:38 PM   Specimen: Right Antecubital; Blood  Result Value Ref Range Status   Specimen Description   Final    RIGHT ANTECUBITAL BOTTLES  DRAWN AEROBIC AND ANAEROBIC   Special Requests Blood Culture adequate volume  Final   Culture  Setup Time   Final    ANAEROBIC BOTTLE ONLY GRAM POSITIVE COCCI Gram Stain Report Called to,Read Back By and Verified With: H Ephraim Hamburger  10/30/19 MKELLY    Culture   Final    NO GROWTH 5 DAYS Performed at [facility], [address] [place], [place] [address]    Report Status 10/30/2019 FINAL  Final  Culture, blood (routine x 2)     Status: None (Preliminary result)   Collection Time: 10/26/19 11:17 AM   Specimen: BLOOD RIGHT ARM  Result Value Ref Range Status   Specimen Description BLOOD RIGHT ARM  Final   Special Requests   Final    BOTTLES DRAWN AEROBIC AND ANAEROBIC Blood Culture adequate volume   Culture   Final    NO GROWTH 4 DAYS Performed at [facility], [address], [place], [place] [address]    Report Status PENDING  Incomplete  Culture, blood (routine x 2)     Status: None (Preliminary result)   Collection Time: 10/26/19 11:17 AM   Specimen: BLOOD LEFT HAND  Result Value Ref Range Status   Specimen Description BLOOD LEFT HAND  Final   Special Requests   Final    BOTTLES DRAWN AEROBIC ONLY Blood Culture adequate volume   Culture   Final    NO GROWTH 4 DAYS Performed at [facility], [address] [place], [place] [address]    Report Status PENDING  Incomplete     US Venous Img Upper Uni Left (DVT)  Result Date: 10/29/2019 CLINICAL DATA:  Left upper extremity pain and swelling for the past 4 days. Prior history of lower extremity DVT EXAM: LEFT UPPER EXTREMITY VENOUS DOPPLER ULTRASOUND TECHNIQUE: Gray-scale sonography with graded compression, as well as color Doppler and duplex ultrasound were performed to evaluate the upper extremity deep venous system from the level of the subclavian vein and including the jugular, axillary, basilic, radial, ulnar and upper cephalic vein. Spectral Doppler was utilized to evaluate flow at rest and with distal augmentation  maneuvers. COMPARISON:  None. FINDINGS: Contralateral Subclavian Vein: Respiratory phasicity is normal and symmetric with the symptomatic side. No evidence of thrombus. Normal compressibility. Internal Jugular Vein: No evidence of thrombus. Normal compressibility, respiratory phasicity and response to augmentation. Subclavian Vein: No evidence of thrombus. Normal compressibility, respiratory phasicity and response to augmentation. Axillary Vein: No evidence of thrombus. Normal compressibility, respiratory phasicity and response to augmentation. Cephalic Vein: Abnormal and noncompressible. The lumen is expanded and filled with low-level internal echoes. Trace flow visible on color Doppler imaging. Findings are consistent with acute to subacute thrombus within the cephalic vein beginning in the proximal forearm and extending into the distal upper arm. Basilic Vein: No evidence of thrombus. Normal compressibility, respiratory phasicity and response to augmentation. Brachial Veins: No evidence of thrombus. Normal compressibility, respiratory phasicity and response to augmentation. Radial Veins:  No evidence of thrombus. Normal compressibility, respiratory phasicity and response to augmentation. Ulnar Veins: No evidence of thrombus. Normal compressibility, respiratory phasicity and response to augmentation. Venous Reflux:  None visualized. Other Findings:  Superficial subcutaneous edema. IMPRESSION: 1. Positive for superficial venous thrombophlebitis involving the cephalic vein from the distal upper arm into the mid forearm. 2. No evidence of deep venous thrombosis in the left upper extremity. Electronically Signed   By: Malachy Moan M.D.   On: 10/29/2019 15:15   ECHO TEE  Result Date: 10/29/2019   TRANSESOPHOGEAL ECHO REPORT   Patient Name:   [name] Date of Exam: 10/29/2019 Medical Rec #:  [id number]     Height:       76.0 in Accession #:    [id number]    Weight:       223.5 lb Date of Birth:  1966/02/19     BSA:          2.32 m Patient Age:    53 years      BP:           134/74 mmHg Patient Gender: M             HR:           81 bpm. [facility]  Procedure: Transesophageal Echo Indications:    Bacteremia  History:        Patient has prior history of Echocardiogram examinations, most                 recent 10/23/2019. S/P BKA (below knee amputation.  Sonographer:    Jeryl Columbia RDCS (AE) Referring Phys: [id number] Ellsworth Lennox  PROCEDURE: The transesophogeal probe was passed through the esophogus of the patient. The patient developed no complications during the procedure. IMPRESSIONS  1. Left ventricular ejection fraction, by visual estimation, is 60 to 65%. The left ventricle has normal function. There is no left ventricular hypertrophy.  2. The left ventricle has no regional wall motion abnormalities.  3. Global right ventricle has normal systolic function.The right ventricular size is normal. Right vetricular wall thickness was not assessed.  4. Left atrial size was normal.  5. Right atrial size was normal.  6. The mitral valve is normal in structure. No evidence of mitral valve regurgitation.  7. The tricuspid valve is normal in structure.  8. The aortic valve is normal in structure. Aortic valve regurgitation is not visualized.  9. The pulmonic valve was normal in structure. Pulmonic valve regurgitation is trivial. FINDINGS  Left Ventricle: Left ventricular ejection fraction, by visual estimation, is 60 to 65%. The left ventricle has normal function. The left ventricle has no regional wall motion abnormalities. There is no left ventricular hypertrophy. Right Ventricle: The right ventricular size is normal. Right vetricular wall thickness was not assessed. Global RV systolic function is has normal systolic function. Left Atrium: Left atrial size was normal in size. LA, LAA without masses. Right Atrium: Right atrial size was normal in size Pericardium: There is no evidence of pericardial  effusion. Mitral Valve: The mitral valve is normal in structure. No evidence of mitral valve regurgitation. Tricuspid Valve: The tricuspid valve is normal in structure. Tricuspid valve regurgitation is trivial. Aortic Valve: The aortic valve is normal in structure. Aortic valve regurgitation is not visualized. Pulmonic Valve: The pulmonic valve was normal in structure. Pulmonic valve regurgitation is trivial. Aorta: The aortic root is normal in size and structure. Shunts: Saline contrast bubble study was negative,  with no evidence of any interatrial shunt. No atrial level shunt detected by color flow Doppler.  Dietrich Pates MD Electronically signed by Dietrich Pates MD Signature Date/Time: 10/29/2019/4:08:15 PM    Final    [place] EKG SITE RITE  Result Date: 10/29/2019 If Site Rite image not attached, placement could not be confirmed due to current cardiac rhythm.   Medications Scheduled Meds: . amiodarone  200 mg Oral BID  . apixaban  10 mg Oral BID   Followed by  . [START ON 11/04/2019] apixaban  5 mg Oral BID  . Chlorhexidine Gluconate Cloth  6 each Topical Q0600  . diltiazem  120 mg Oral Daily  . insulin aspart  0-15 Units Subcutaneous TID WC  . insulin aspart  0-5 Units Subcutaneous QHS  . insulin glargine  30 Units Subcutaneous QHS  . sodium chloride flush  10-40 mL Intracatheter Q12H  . sodium chloride flush  3 mL Intravenous Q12H   Continuous Infusions: . ampicillin-sulbactam (UNASYN) IV 3 g (10/30/19 0931)   PRN Meds:.acetaminophen **OR** acetaminophen, fentaNYL (SUBLIMAZE) injection, fluticasone, promethazine, sodium chloride flush  Assessment 1. 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 2. DVT, RLE  Plan Changed packing of R foot. Applied dressing to R foot. ContinuePrevalon boot to RLE. Continue NWB RLE. Continue antibiotics per ID. DVT treatment per IM. Will need home health for dressing changes and  monitor of surgical wound.  Packing should be changed every day.  I would like to see him on 11/04/2019 in my [place] office.  He can call the office Monday to schedule a time. Podiatry service will continue to follow him daily while inpatient.  Marius Ditch 10/30/2019, 9:55 AM

## 2019-10-30 NOTE — Progress Notes (Signed)
Peripherally Inserted Central Catheter/Midline Placement  The IV Nurse has discussed with the patient and/or persons authorized to consent for the patient, the purpose of this procedure and the potential benefits and risks involved with this procedure.  The benefits include less needle sticks, lab draws from the catheter, and the patient may be discharged home with the catheter. Risks include, but not limited to, infection, bleeding, blood clot (thrombus formation), and puncture of an artery; nerve damage and irregular heartbeat and possibility to perform a PICC exchange if needed/ordered by physician.  Alternatives to this procedure were also discussed.  Bard Power PICC patient education guide, fact sheet on infection prevention and patient information card has been provided to patient /or left at bedside.    PICC/Midline Placement Documentation  PICC Single Lumen 67/54/49 PICC Right Basilic 45 cm 0 cm (Active)  Indication for Insertion or Continuance of Line Home intravenous therapies (PICC only) 10/30/19 0800  Exposed Catheter (cm) 0 cm 10/30/19 0800  Site Assessment Clean;Dry;Intact 10/30/19 0800  Line Status Flushed;Blood return noted 10/30/19 0800  Dressing Type Transparent 10/30/19 0800  Dressing Status Clean;Dry;Intact;Antimicrobial disc in place 10/30/19 0800  Dressing Change Due 11/06/19 10/30/19 0800       [name], [name] 10/30/2019, 8:24 AM

## 2019-10-30 NOTE — Discharge Summary (Signed)
Physician Discharge Summary  [name] NAT:[id number] DOB: Mar 17, 1966 DOA: 10/22/2019  PCP: Dettinger, [name], MD  Admit date: 10/22/2019 Discharge date: 10/30/2019  Admitted From: Home Disposition: Home  Recommendations for Outpatient Follow-up:  1. Follow up with PCP in 1-2 weeks 2. Please obtain BMP/CBC in one week 3. Follow-up with podiatry on 12/29 4. Follow-up with cardiology has been arranged  Home Health: Home health RN Equipment/Devices: Home IV antibiotics through PICC line  Discharge Condition: Stable CODE STATUS: Full code Diet recommendation: Heart healthy, carb modified  Brief/Interim Summary: 53 year old male with a history of diabetes mellitus type 2, diabetic polyneuropathy, hypertension, left BKA, and right toe amputations secondary to diabetic foot infections presenting with 1 day history of increasing erythema, edema, and pain on his right foot. The patient stated that his podiatrist, Dr. [name] started him on 10 days of doxycycline on 10/10/2019 for his diabetic foot ulcer on his right fourth toe on 10/10/2019.The patient is a bit vague regarding the history of his most recent foot ulcer on his right fourth toe, but stated that he has noted some intermittent drainage in the past 1 to 2 days prior to admission. He had denied any fevers, chills, headache, neck pain, chest pain, shortness breath, cough, hemoptysis, abdominal pain, dysuria, hematuria. The patient did have episodes of nausea and vomiting for the past 3 days. He is also noted some loose stools without any hematochezia or melena. Because of his nausea, vomiting, and right foot erythema and pain he presented for further evaluation. In the ED, the patient had temperature 100.6 F. He was noted to have atrial fibrillation with RVR with heart rates into the 180s. The patient was hemodynamically stable with oxygen saturation 96-97% on room air. WBC was 12.1 with hemoglobin 16.0 and platelets  176K.Sodium was 125 with serum creatinine 1.42. Lactic acid peaked at 3.8. The patient was fluid resuscitated and started on vancomycin and Zosyn. Blood cultures from the emergency department were also obtained.  Discharge Diagnoses:  Principal Problem:   Staphylococcus aureus bacteremia Active Problems:   Essential hypertension, benign   Charcot ankle   Osteomyelitis of toe of right foot (HCC)   Type 2 diabetes mellitus (HCC)   Diabetic foot infection (HCC)   S/P BKA (below knee amputation) unilateral, left (HCC)   Severe sepsis (HCC)   Cellulitis of right lower extremity   Elevated lactic acid level   Leukocytosis   Atrial fibrillation with rapid ventricular response (HCC)   Nausea & vomiting   Diarrhea   AKI (acute kidney injury) ([place])   Hypokalemia   Acute hyponatremia   DKA, type 2 ([place])   Uncontrolled type 2 diabetes mellitus with hyperglycemia, with long-term current use of insulin (HCC)   Sepsis ([place])   Staphylococcus aureus sepsis (HCC)   Acute deep vein thrombosis (DVT) of right popliteal vein ([place])  Sepsis with organ dysfunction -Secondary to diabetic foot infection and bacteremia -Lactic acid peaked 3.8 -check PCT10.37>>>7.96>>>6.70 -Continue vancomycin>>>cefazolin>>>amp/sulbactam -Discontinue Zosyn -Continue IV fluids>>saline lock -SARS-CoV2--neg -10/24/19 spike fever 3 hours post-op -sepsis physiology resolved  Diabetic foot infection/cellulitis right foot/Osteomyelitis toe -Podiatry, Dr. [name] has been consulted--appreciate followup -MRI foot--osteomyelitis of R-third toe with diffuse cellulitis and myositis -CRP 40.7 -[id number] -10/24/19--Right third ray amputation -10/24/19--bone culture--OSSA and S. anginosus + possible anaerobe  Bacteremia--Staph aureus-OSSA -Source is likely foot infection -12/17 blood culture--OSSA -12/18blood cultures--OSSA -12/18 bone culture--OSSA and S. anginosus + Prevotella (Bacteroides) melanogenica -12/19 blood  culture--neg -12/20 blood culture--neg -Discontinue vanco -Discontinue Zosyn -cefazolin>>>unasyn--plan 2  weeks beyond last negative blood culture - TEE 12/24 did not show any evidence of vegetations -PICC line placed on 12/24 -Per ID recommendations, will be discharged on Ancef until 1/2 as well as metronidazole until 1/2  Right popliteal and peroneal DVT -10/25/19 venous duplex +DVT -already on heparin IV>>>apixaban-->adjust dose for dvt dosing   DKA, type 2 -pt presented with serum glucose 389 with anion gap 15 and ketonuria -resolved with aggressive fluid resuscitation -transition to [place] insulin  AKI -Resulting from sepsis -Baseline creatinine 0.7-1.0 -Serum creatinine peaked 1.42 -improved  Atrial fibrillation with RVR, type unspecified -CHADS-VASc = 3 (HTN, DM2, PAD) -Patient is on anticoagulation with apixaban -Echo--EF 70-75%, no WMA, trace MR/TR, mild increase PASP -TSH--0.929 -switch to po amiodarone12/20 -switch to po dilitazem12/20 -appreciate cardiology followup  Uncontrolled diabetes mellitus type 2 with hyperglycemia -Anticipate elevated CBGs secondary to sepsis -07/22/2019 hemoglobin A1c 12.4 -Increase Lantus to 30 units hs -NovoLog sliding scale -Holding metformin -12/17/20A1C--9.8 Resume home regimen on discharge  Hypokalemia/hypomagnesemia -Replaced  Hyponatremia -Multifactorial including hyperglycemia, volume depletion, AKI -Improved with IV fluids  Intractable nausea and vomiting -Suspect a component of gastritis/diabetic gastroparesis incited by sepsisand DKA -improved -advancedto carb modified diet  Diarrhea -C. difficile negative -Currently resolved  Essential hypertension -Blood pressure currently controlled on diltiazem drip -Resume lisinopril on discharge  Hyperlipidemia -Continue statin  Left arm pain -venous duplex--show superficial phlebitis, continue supportive therapy  Discharge  Instructions  Discharge Instructions    Diet - low sodium heart healthy   Complete by: As directed    Home infusion instructions   Complete by: As directed    Instructions: Flushing of vascular access device: 0.9% NaCl pre/post medication administration and prn patency; Heparin 100 u/ml, 26m for implanted ports and Heparin 10u/ml, 523mfor all other central venous catheters.   Increase activity slowly   Complete by: As directed      Allergies as of 10/30/2019   No Known Allergies     Medication List    STOP taking these medications   doxycycline 100 MG tablet Commonly known as: VIBRA-TABS   ibuprofen 200 MG tablet Commonly known as: ADVIL     TAKE these medications   albuterol 108 (90 Base) MCG/ACT inhaler Commonly known as: VENTOLIN HFA Inhale 2 puffs into the lungs every 6 (six) hours as needed for wheezing or shortness of breath.   amiodarone 200 MG tablet Commonly known as: PACERONE Take 1 tablet (200 mg total) by mouth 2 (two) times daily.   apixaban 5 MG Tabs tablet Commonly known as: ELIQUIS Take 1060mo bid for 4 more days then 5mg68m bid   aspirin EC 81 MG EC tablet Generic drug: aspirin Take 81 mg by mouth daily. Swallow whole.   atorvastatin 20 MG tablet Commonly known as: LIPITOR Take 1 tablet (20 mg total) by mouth daily.   ceFAZolin  IVPB Commonly known as: ANCEF Inject 2 g into the vein every 8 (eight) hours for 10 days. Indication:  MSSA bacteremia  Last Day of Therapy:  11/08/2019 Labs - Once weekly:  CBC/D and BMP, Labs - Every other week:  ESR and CRP   cyclobenzaprine 10 MG tablet Commonly known as: FLEXERIL Take 1 tablet (10 mg total) by mouth at bedtime as needed for muscle spasms.   diltiazem 120 MG 24 hr capsule Commonly known as: CARDIZEM CD Take 1 capsule (120 mg total) by mouth daily. Start taking on: October 31, 2019   DULoxetine 60 MG capsule Commonly known as: Cymbalta  Take 1 capsule (60 mg total) by mouth daily.    fluticasone 50 MCG/ACT nasal spray Commonly known as: FLONASE Place 1 spray into both nostrils 2 (two) times daily as needed for allergies or rhinitis.   FreeStyle Libre 14 Day Reader [name] 1 each by Does not apply route daily.   FreeStyle Libre 14 Day Sensor Misc 1 each by Other route every 14 (fourteen) days.   gabapentin 600 MG tablet Commonly known as: NEURONTIN Take 1 tablet (600 mg total) by mouth at bedtime.   HYDROcodone-acetaminophen 10-325 MG tablet Commonly known as: NORCO Take 1 tablet by mouth 2 (two) times daily as needed for moderate pain. What changed: Another medication with the same name was removed. Continue taking this medication, and follow the directions you see here.   lisinopril 5 MG tablet Commonly known as: ZESTRIL Take 1 tablet (5 mg total) by mouth daily.   metFORMIN 1000 MG tablet Commonly known as: GLUCOPHAGE Take 1 tablet (1,000 mg total) by mouth 2 (two) times daily.   metroNIDAZOLE 500 MG tablet Commonly known as: Flagyl Take 1 tablet (500 mg total) by mouth 3 (three) times daily for 9 days.   multivitamin with minerals tablet Take 1 tablet by mouth daily.   terbinafine 1 % cream Commonly known as: LamISIL AT Apply 1 application topically 2 (two) times daily.   Toujeo Max SoloStar 300 UNIT/ML Sopn Generic drug: Insulin Glargine (2 Unit Dial) Inject 40-50 Units into the skin daily.   zinc gluconate 50 MG tablet Take 50 mg by mouth daily.            Home Infusion Instuctions  (From admission, onward)         Start     Ordered   10/30/19 0000  Home infusion instructions    Question:  Instructions  Answer:  Flushing of vascular access device: 0.9% NaCl pre/post medication administration and prn patency; Heparin 100 u/ml, 46m for implanted ports and Heparin 10u/ml, 51mfor all other central venous catheters.   10/30/19 1109         Follow-up Information    [facility] Follow [facility].   Specialty: HoSamsonhy: RN for IV antibiotic        StErma HeritagePA-C Follow up on 11/11/2019.   Specialties: Physician Assistant, Cardiology Why: Cardiology Hospital Follow-up on 11/10/2018 at 2:00 PM.  Contact information: [address] [place] [phone]        No Known Allergies  Consultations:  Podiatry  Cardiology  Infectious disease   Procedures/Studies: DG Tibia/Fibula Right  Result Date: 10/22/2019 CLINICAL DATA:  Right leg pain and tenderness. Fever. Clinical evidence of infection. Diabetes. EXAM: RIGHT TIBIA AND FIBULA - 2 VIEW COMPARISON:  Right ankle radiographs obtained at the same time. FINDINGS: Mild diffuse soft tissue swelling. Previously described chronic appearing periosteal reaction involving the distal tibia. On these radiographs, this is involving the medial and posterior aspects of the tibia. Also again noted are previously described bone collapse and fragmentation involving the talotibial joint and flattening of the normal plantar arch. No soft tissue gas is seen. Mild medial and lateral spur formation at the knee joint with mild to moderate medial joint space narrowing. IMPRESSION: 1. Chronic appearing periosteal reaction involving the posterior and medial aspects of the distal tibia. Underlying chronic osteomyelitis is a possibility. 2. Diffuse soft tissue swelling without gas or bone destruction. 3. Neuropathic changes at the talotibial joint with  pes planus. Electronically Signed   By: Claudie Revering M.D.   On: 10/22/2019 16:05   DG Ankle Complete Right  Result Date: 10/22/2019 CLINICAL DATA:  Right leg pain and tenderness and fever with clinical evidence of infection. Diabetes. EXAM: RIGHT ANKLE - COMPLETE 3+ VIEW COMPARISON:  Right foot radiographs obtained today and on 06/19/2016. FINDINGS: Diffuse soft tissue swelling. Bone collapse and fragmentation at the talotibial joint, described on the foot radiographs, in addition to previously  described flattening of the normal plantar arch. Again demonstrated are is chronic appearing posterior periosteal reaction involving the distal tibia. No bone destruction or soft tissue gas seen. IMPRESSION: 1. Diffuse soft tissue swelling without underlying changes of acute osteomyelitis. 2. Neuropathic changes at the talotibial joint. 3. Pes planus. Electronically Signed   By: Claudie Revering M.D.   On: 10/22/2019 16:02   MR FOOT RIGHT W WO CONTRAST  Result Date: 10/23/2019 CLINICAL DATA:  Nonhealing ulcer involving the third toe. History of prior amputations. EXAM: MRI OF THE RIGHT FOREFOOT WITHOUT AND WITH CONTRAST TECHNIQUE: Multiplanar, multisequence MR imaging of the right foot was performed before and after the administration of intravenous contrast. CONTRAST:  59m GADAVIST GADOBUTROL 1 MMOL/ML IV SOLN COMPARISON:  Radiographs 10/22/2019 FINDINGS: Abnormal T1 and T2 signal intensity in the proximal phalanx of the third toe and also in what appears to be a small residual portion of the middle phalanx. There is also associated abnormal contrast enhancement and surrounding cellulitis. Findings consistent with osteomyelitis. No other definite sites of acute osteomyelitis are identified. Evidence of prior amputations involving the first and fifth toes and parts of the other toes. The metatarsal bones are intact. No findings for septic arthritis involving the remaining MTP joints or involving the midfoot joint spaces. There is moderate edema like signal abnormality in the navicular bone along with some enhancement. Could not exclude osteomyelitis. Severe diffuse cellulitis involving the entire midfoot and forefoot. Mild diffuse myositis without findings for pyomyositis. IMPRESSION: 1. Osteomyelitis involving the third digit as detailed above. 2. Abnormal signal intensity and enhancement in the navicular bone. This is not completely covered but could not exclude osteomyelitis. 3. Severe diffuse cellulitis and  mild diffuse myositis. Electronically Signed   By: PMarijo SanesM.D.   On: 10/23/2019 16:50   UKoreaVenous Img Lower Unilateral Right (DVT)  Result Date: 10/25/2019 CLINICAL DATA:  Right calf pain and edema. EXAM: RIGHT LOWER EXTREMITY VENOUS DOPPLER ULTRASOUND TECHNIQUE: Gray-scale sonography with graded compression, as well as color Doppler and duplex ultrasound were performed to evaluate the lower extremity deep venous systems from the level of the common femoral vein and including the common femoral, femoral, profunda femoral, popliteal and calf veins including the posterior tibial, peroneal and gastrocnemius veins when visible. The superficial great saphenous vein was also interrogated. Spectral Doppler was utilized to evaluate flow at rest and with distal augmentation maneuvers in the common femoral, femoral and popliteal veins. COMPARISON:  None. FINDINGS: Contralateral Common Femoral Vein: Respiratory phasicity is normal and symmetric with the symptomatic side. No evidence of thrombus. Normal compressibility. Common Femoral Vein: No evidence of thrombus. Normal compressibility, respiratory phasicity and response to augmentation. Saphenofemoral Junction: No evidence of thrombus. Normal compressibility and flow on color Doppler imaging. Profunda Femoral Vein: No evidence of thrombus. Normal compressibility and flow on color Doppler imaging. Femoral Vein: No evidence of thrombus. Normal compressibility, respiratory phasicity and response to augmentation. Popliteal Vein: Occlusive thrombus present in the right popliteal vein. Calf Veins: Posterior tibial vein  normally patent. Visualized peroneal vein demonstrates some nonocclusive thrombus in the upper calf. Superficial Great Saphenous Vein: No evidence of thrombus. Normal compressibility. Venous Reflux:  None. Other Findings: No evidence of superficial thrombophlebitis or abnormal fluid collection. IMPRESSION: Positive for right lower extremity DVT with  occlusive thrombus in the popliteal vein and nonocclusive thrombus in the peroneal vein. Electronically Signed   By: Aletta Edouard M.D.   On: 10/25/2019 14:21   US Venous Img Upper Uni Left (DVT)  Result Date: 10/29/2019 CLINICAL DATA:  Left upper extremity pain and swelling for the past 4 days. Prior history of lower extremity DVT EXAM: LEFT UPPER EXTREMITY VENOUS DOPPLER ULTRASOUND TECHNIQUE: Gray-scale sonography with graded compression, as well as color Doppler and duplex ultrasound were performed to evaluate the upper extremity deep venous system from the level of the subclavian vein and including the jugular, axillary, basilic, radial, ulnar and upper cephalic vein. Spectral Doppler was utilized to evaluate flow at rest and with distal augmentation maneuvers. COMPARISON:  None. FINDINGS: Contralateral Subclavian Vein: Respiratory phasicity is normal and symmetric with the symptomatic side. No evidence of thrombus. Normal compressibility. Internal Jugular Vein: No evidence of thrombus. Normal compressibility, respiratory phasicity and response to augmentation. Subclavian Vein: No evidence of thrombus. Normal compressibility, respiratory phasicity and response to augmentation. Axillary Vein: No evidence of thrombus. Normal compressibility, respiratory phasicity and response to augmentation. Cephalic Vein: Abnormal and noncompressible. The lumen is expanded and filled with low-level internal echoes. Trace flow visible on color Doppler imaging. Findings are consistent with acute to subacute thrombus within the cephalic vein beginning in the proximal forearm and extending into the distal upper arm. Basilic Vein: No evidence of thrombus. Normal compressibility, respiratory phasicity and response to augmentation. Brachial Veins: No evidence of thrombus. Normal compressibility, respiratory phasicity and response to augmentation. Radial Veins: No evidence of thrombus. Normal compressibility, respiratory  phasicity and response to augmentation. Ulnar Veins: No evidence of thrombus. Normal compressibility, respiratory phasicity and response to augmentation. Venous Reflux:  None visualized. Other Findings:  Superficial subcutaneous edema. IMPRESSION: 1. Positive for superficial venous thrombophlebitis involving the cephalic vein from the distal upper arm into the mid forearm. 2. No evidence of deep venous thrombosis in the left upper extremity. Electronically Signed   By: Jacqulynn Cadet M.D.   On: 10/29/2019 15:15   DG Chest Port 1 View  Result Date: 10/22/2019 CLINICAL DATA:  Fever EXAM: PORTABLE CHEST 1 VIEW COMPARISON:  12/09/2013 chest radiograph. FINDINGS: Stable cardiomediastinal silhouette with top-normal heart size. No pneumothorax. No pleural effusion. Lungs appear clear, with no acute consolidative airspace disease and no pulmonary edema. IMPRESSION: No active disease. Electronically Signed   By: Ilona Sorrel M.D.   On: 10/22/2019 15:55   DG Foot Complete Right  Result Date: 10/24/2019 CLINICAL DATA:  Patient status post amputation of the right third metatarsal head and toe today. EXAM: RIGHT FOOT COMPLETE - 3+ VIEW COMPARISON:  Plain films the right foot 10/22/2019. FINDINGS: The right third toe and head and neck of the third metatarsal have been amputated. Surgical wound with packing noted. The patient is status post prior amputation of the great toe and distal phalanx of the second toe. 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 No acute abnormality. Electronically Signed   By: Inge Rise M.D.   On: 10/24/2019 15:33   DG Foot Complete Right  Result Date: 10/22/2019 CLINICAL DATA:  Right foot pain and fever. Diabetes. Clinical infection.  EXAM: RIGHT FOOT COMPLETE - 3+ VIEW COMPARISON:  06/19/2016. FINDINGS: Diffuse soft tissue swelling. Again demonstrated surgical absence of the right great toe with interval surgical absence of the  right little toe. Well-defined bone margins. No periosteal reaction or bone destruction involving the bones of the foot. There is flattening of the normal plantar arch and interval partial bone collapse and fragmentation involving the distal tibia and talus. There is also chronic appearing periosteal thickening and irregularity involving the distal tibia posteriorly. IMPRESSION: 1. Diffuse soft tissue swelling without soft tissue gas or evidence of underlying osteomyelitis. 2. Pes planus. 3. 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 Electronically Signed   By: Claudie Revering M.D.   On: 10/22/2019 15:59   ECHOCARDIOGRAM COMPLETE  Result Date: 10/23/2019   ECHOCARDIOGRAM REPORT   Patient Name:   [name] Date of Exam: 10/23/2019 Medical Rec #:  [id number]     Height:       76.0 in Accession #:    [id number]    Weight:       206.8 lb Date of Birth:  04-23-1966    BSA:          2.25 m Patient Age:    62 years      BP:           99/54 mmHg Patient Gender: M             HR:           120 bpm. Exam Location:  [facility] Procedure: 2D Echo, Cardiac Doppler and Color Doppler Indications:    Atrial Fibrillation 427.31 / I48.91  History:        Patient has no prior history of Echocardiogram examinations.                 Arrythmias:Atrial Fibrillation; Risk Factors:Diabetes and                 Hypertension.  Sonographer:    Alvino Chapel RCS Referring Phys: [phone] DAVID TAT IMPRESSIONS  1. Left ventricular ejection fraction, by visual estimation, is 70 to 75%. The left ventricle has hyperdynamic function. There is mildly increased left ventricular hypertrophy.  2. Left ventricular diastolic parameters are indeterminate.  3. The left ventricle has no regional wall motion abnormalities.  4. Global right ventricle has normal systolic function.The right ventricular size is normal. No increase in right ventricular wall thickness.  5. Left atrial size was normal.  6. Right atrial size was  normal.  7. The mitral valve is grossly normal. Trivial mitral valve regurgitation.  8. The tricuspid valve is grossly normal. Tricuspid valve regurgitation is trivial.  9. The aortic valve is tricuspid. Aortic valve regurgitation is not visualized. 10. The pulmonic valve was grossly normal. Pulmonic valve regurgitation is not visualized. 11. Mildly elevated pulmonary artery systolic pressure. 12. The tricuspid regurgitant velocity is 3.04 m/s, and with an assumed right atrial pressure of 3 mmHg, the estimated right ventricular systolic pressure is mildly elevated at 40.0 mmHg. 13. The inferior vena cava is normal in size with greater than 50% respiratory variability, suggesting right atrial pressure of 3 mmHg. FINDINGS  Left Ventricle: Left ventricular ejection fraction, by visual estimation, is 70 to 75%. The left ventricle has hyperdynamic function. The left ventricle has no regional wall motion abnormalities. The left ventricular internal cavity size was the left ventricle is normal in size. There is mildly increased left ventricular hypertrophy. Septal left ventricular hypertrophy. Left  ventricular diastolic parameters are indeterminate. Right Ventricle: The right ventricular size is normal. No increase in right ventricular wall thickness. Global RV systolic function is has normal systolic function. The tricuspid regurgitant velocity is 3.04 m/s, and with an assumed right atrial pressure  of 3 mmHg, the estimated right ventricular systolic pressure is mildly elevated at 40.0 mmHg. Left Atrium: Left atrial size was normal in size. Right Atrium: Right atrial size was normal in size Pericardium: There is no evidence of pericardial effusion. Mitral Valve: The mitral valve is grossly normal. Trivial mitral valve regurgitation. Tricuspid Valve: The tricuspid valve is grossly normal. Tricuspid valve regurgitation is trivial. Aortic Valve: The aortic valve is tricuspid. Aortic valve regurgitation is not visualized.  Pulmonic Valve: The pulmonic valve was grossly normal. Pulmonic valve regurgitation is not visualized. Pulmonic regurgitation is not visualized. Aorta: The aortic root is normal in size and structure. Venous: The inferior vena cava is normal in size with greater than 50% respiratory variability, suggesting right atrial pressure of 3 mmHg. IAS/Shunts: No atrial level shunt detected by color flow Doppler.  LEFT VENTRICLE PLAX 2D LVIDd:         4.44 cm LVIDs:         2.56 cm LV PW:         1.00 cm LV IVS:        1.26 cm LVOT diam:     2.00 cm LV SV:         66 ml LV SV Index:   29.33 LVOT Area:     3.14 cm  RIGHT VENTRICLE TAPSE (M-mode): 1.7 cm LEFT ATRIUM             Index       RIGHT ATRIUM           Index LA diam:        3.30 cm 1.47 cm/m  RA Area:     20.60 cm LA Vol (A2C):   52.0 ml 23.14 ml/m RA Volume:   57.20 ml  25.46 ml/m LA Vol (A4C):   40.9 ml 18.20 ml/m LA Biplane Vol: 47.2 ml 21.01 ml/m  AORTIC VALVE LVOT Vmax:   129.00 cm/s LVOT Vmean:  82.933 cm/s LVOT VTI:    0.185 m  AORTA Ao Root diam: 3.30 cm MITRAL VALVE                        TRICUSPID VALVE MV Area (PHT): 3.21 cm             TR Peak grad:   37.0 mmHg MV PHT:        68.44 msec           TR Vmax:        304.00 cm/s MV Decel Time: 236 msec MV E velocity: 107.00 cm/s 103 cm/s SHUNTS                                     Systemic VTI:  0.19 m                                     Systemic Diam: 2.00 cm  Rozann Lesches MD Electronically signed by Rozann Lesches MD Signature Date/Time: 10/23/2019/9:39:01 AM    Final    ECHO TEE  Result Date: 10/29/2019  TRANSESOPHOGEAL ECHO REPORT   Patient Name:   [name] Date of Exam: 10/29/2019 Medical Rec #:  [id number]     Height:       76.0 in Accession #:    [id number]    Weight:       223.5 lb Date of Birth:  21-Jul-1966    BSA:          2.32 m Patient Age:    46 years      BP:           134/74 mmHg Patient Gender: M             HR:           81 bpm. Exam Location:  [facility]  Procedure:  Transesophageal Echo Indications:    Bacteremia  History:        Patient has prior history of Echocardiogram examinations, most                 recent 10/23/2019. S/P BKA (below knee amputation.  Sonographer:    Leavy Cella RDCS (AE) Referring Phys: [id number] Erma Heritage  PROCEDURE: The transesophogeal probe was passed through the esophogus of the patient. The patient developed no complications during the procedure. IMPRESSIONS  1. Left ventricular ejection fraction, by visual estimation, is 60 to 65%. The left ventricle has normal function. There is no left ventricular hypertrophy.  2. The left ventricle has no regional wall motion abnormalities.  3. Global right ventricle has normal systolic function.The right ventricular size is normal. Right vetricular wall thickness was not assessed.  4. Left atrial size was normal.  5. Right atrial size was normal.  6. The mitral valve is normal in structure. No evidence of mitral valve regurgitation.  7. The tricuspid valve is normal in structure.  8. The aortic valve is normal in structure. Aortic valve regurgitation is not visualized.  9. The pulmonic valve was normal in structure. Pulmonic valve regurgitation is trivial. FINDINGS  Left Ventricle: Left ventricular ejection fraction, by visual estimation, is 60 to 65%. The left ventricle has normal function. The left ventricle has no regional wall motion abnormalities. There is no left ventricular hypertrophy. Right Ventricle: The right ventricular size is normal. Right vetricular wall thickness was not assessed. Global RV systolic function is has normal systolic function. Left Atrium: Left atrial size was normal in size. LA, LAA without masses. Right Atrium: Right atrial size was normal in size Pericardium: There is no evidence of pericardial effusion. Mitral Valve: The mitral valve is normal in structure. No evidence of mitral valve regurgitation. Tricuspid Valve: The tricuspid valve is normal in structure.  Tricuspid valve regurgitation is trivial. Aortic Valve: The aortic valve is normal in structure. Aortic valve regurgitation is not visualized. Pulmonic Valve: The pulmonic valve was normal in structure. Pulmonic valve regurgitation is trivial. Aorta: The aortic root is normal in size and structure. Shunts: Saline contrast bubble study was negative, with no evidence of any interatrial shunt. No atrial level shunt detected by color flow Doppler.  [name] MD Electronically signed by [name] MD Signature Date/Time: 10/29/2019/4:08:15 PM    Final    [place] EKG SITE RITE  Result Date: 10/29/2019 If Site Rite image not attached, placement could not be confirmed due to current cardiac rhythm.      Subjective: No pain, no new complaints  Discharge Exam: Vitals:   10/30/19 0500 10/30/19 0547 10/30/19 0750 10/30/19 1417  BP:  136/68  139/69  Pulse:  82  83  Resp:  20  20  Temp:  98.6 F (37 C)  98.6 F (37 C)  TempSrc:  Oral  Oral  SpO2:  99% 97% 97%  Weight: 103.1 kg     Height:        General: Pt is alert, awake, not in acute distress Cardiovascular: RRR, S1/S2 +, no rubs, no gallops Respiratory: CTA bilaterally, no wheezing, no rhonchi Abdominal: Soft, NT, ND, bowel sounds + Extremities: Right foot is wrapped in dressings, left BKA    The results of significant diagnostics from this hospitalization (including imaging, microbiology, ancillary and laboratory) are listed below for reference.     Microbiology: Recent Results (from the past 240 hour(s))  Urine culture     Status: Abnormal   Collection Time: 10/22/19  3:08 PM   Specimen: In/Out Cath Urine  Result Value Ref Range Status   Specimen Description   Final    IN/OUT CATH URINE Performed at [facility], [address] [place], [place] [address]    Special Requests   Final    NONE Performed at [facility], [address], [place], [place] [address]    Culture (A)  Final    <10,000 COLONIES/mL INSIGNIFICANT  GROWTH Performed at [place] [address] [place], [place] [address]    Report Status 10/23/2019 FINAL  Final  Blood Culture (routine x 2)     Status: Abnormal   Collection Time: 10/22/19  3:20 PM   Specimen: BLOOD LEFT WRIST  Result Value Ref Range Status   Specimen Description   Final    BLOOD LEFT WRIST Performed at [facility], [address] [place], [place] [address]    Special Requests   Final    BOTTLES DRAWN AEROBIC AND ANAEROBIC Blood Culture adequate volume Performed at [facility], [address] [place], [place] [address]    Culture  Setup Time   Final    GRAM POSITIVE COCCI BOTH ANAEROBIC AND AEROBIC BOTTLES Gram Stain Report Called to,Read Back By and Verified With: HEARN,J'@0705'$  BY MATTHEWS, B 12.17.2020 [facility] Performed at [facility], [address], [place], [place] [address]    Culture STAPHYLOCOCCUS AUREUS (A)  Final   Report Status 10/26/2019 FINAL  Final   Organism ID, Bacteria STAPHYLOCOCCUS AUREUS  Final      Susceptibility   Staphylococcus aureus - MIC*    CIPROFLOXACIN >=8 RESISTANT Resistant     ERYTHROMYCIN >=8 RESISTANT Resistant     GENTAMICIN <=0.5 SENSITIVE Sensitive     OXACILLIN 0.5 SENSITIVE Sensitive     TETRACYCLINE >=16 RESISTANT Resistant     VANCOMYCIN 1 SENSITIVE Sensitive     TRIMETH/SULFA <=10 SENSITIVE Sensitive     CLINDAMYCIN RESISTANT Resistant     RIFAMPIN <=0.5 SENSITIVE Sensitive     Inducible Clindamycin POSITIVE Resistant     * STAPHYLOCOCCUS AUREUS  Blood Culture (routine x 2)     Status: Abnormal   Collection Time: 10/22/19  3:31 PM   Specimen: Right Antecubital; Blood  Result Value Ref Range Status   Specimen Description   Final    RIGHT ANTECUBITAL Performed at [facility], [address], [place], [place] [address]    Special Requests   Final    BOTTLES DRAWN AEROBIC AND ANAEROBIC Blood Culture adequate volume Performed at [facility], [address], [place], [place] [address]     Culture  Setup Time   Final    GRAM POSITIVE  COCCI ANAEROBIC AND AEROBIC BOTTLES Gram Stain Report Called to,Read Back By and Verified With: HEARN,J'@0705'$  BY MATTHEWS B 12.17.2020 [facility] Performed at [facility], [address], [place], [place] [address]    Culture (A)  Final    STAPHYLOCOCCUS AUREUS SUSCEPTIBILITIES PERFORMED ON PREVIOUS CULTURE WITHIN THE LAST 5 DAYS. Performed at [facility], [place] [address] [place], [place] [address]    Report Status 10/26/2019 FINAL  Final  SARS CORONAVIRUS 2 (TAT 6-24 HRS) Nasopharyngeal Nasopharyngeal Swab     Status: None   Collection Time: 10/22/19  4:14 PM   Specimen: Nasopharyngeal Swab  Result Value Ref Range Status   SARS Coronavirus 2 NEGATIVE NEGATIVE Final    Comment: (NOTE) SARS-CoV-2 target nucleic acids are NOT DETECTED. The SARS-CoV-2 RNA is generally detectable in upper and lower respiratory specimens during the acute phase of infection. Negative results do not preclude SARS-CoV-2 infection, do not rule out co-infections with other pathogens, and should not be used as the sole basis for treatment or other patient management decisions. Negative results must be combined with clinical observations, patient history, and epidemiological information. The expected result is Negative. Fact Sheet for Patients: SugarRoll.be Fact Sheet for Healthcare Providers: https://www.woods-mathews.com/ This test is not yet approved or cleared by the [place] FDA and  has been authorized for detection and/or diagnosis of SARS-CoV-2 by FDA under an Emergency Use Authorization (EUA). This EUA will remain  in effect (meaning this test can be used) for the duration of the COVID-19 declaration under Section 56 4(b)(1) of the Act, 21 U.S.C. section 360bbb-3(b)(1), unless the authorization is terminated or revoked sooner. Performed at [facility], [place] [address], [place],  [place] [address]   MRSA PCR Screening     Status: None   Collection Time: 10/22/19  8:11 PM   Specimen: Nasal Mucosa; Nasopharyngeal  Result Value Ref Range Status   MRSA by PCR NEGATIVE NEGATIVE Final    Comment:        The GeneXpert MRSA Assay (FDA approved for NASAL specimens only), is one component of a comprehensive MRSA colonization surveillance program. It is not intended to diagnose MRSA infection nor to guide or monitor treatment for MRSA infections. Performed at [facility], [address] [place], [place] [address]   C difficile quick scan w PCR reflex     Status: None   Collection Time: 10/22/19 11:56 PM   Specimen: STOOL  Result Value Ref Range Status   C Diff antigen NEGATIVE NEGATIVE Final   C Diff toxin NEGATIVE NEGATIVE Final   C Diff interpretation No C. difficile detected.  Final    Comment: Performed at [facility], [address], [place], [place] [address]  Culture, blood (routine x 2)     Status: None   Collection Time: 10/23/19  8:44 AM   Specimen: BLOOD RIGHT HAND  Result Value Ref Range Status   Specimen Description BLOOD RIGHT HAND  Final   Special Requests   Final    BOTTLES DRAWN AEROBIC AND ANAEROBIC Blood Culture adequate volume   Culture   Final    NO GROWTH 5 DAYS Performed at [facility], [address] [place], [place] [address]    Report Status 10/28/2019 FINAL  Final  Culture, blood (routine x 2)     Status: None   Collection Time: 10/23/19  8:46 AM   Specimen: BLOOD RIGHT HAND  Result Value Ref Range Status   Specimen Description BLOOD RIGHT HAND  Final   Special Requests   Final    BOTTLES DRAWN AEROBIC AND ANAEROBIC Blood Culture results may not be optimal due to an excessive volume of blood received in culture bottles   Culture   Final    NO GROWTH 5 DAYS Performed at [facility], [address] [place], [place] [address]    Report Status 10/28/2019 FINAL  Final  Aerobic/Anaerobic Culture (surgical/deep wound)     Status: None    Collection Time: 10/24/19  2:20 PM   Specimen: Soft Tissue, Other  Result Value Ref Range Status   Specimen Description   Final    TOE SOFT TISSUE AND BONE OF 3RD Performed at [facility], [address] [place], [place] [address]    Special Requests   Final    NONE Performed at [facility], [address] [place], [place] [address]    Gram Stain NO WBC SEEN FEW GRAM POSITIVE COCCI   Final   Culture   Final    MODERATE STAPHYLOCOCCUS AUREUS FEW STREPTOCOCCUS ANGINOSIS FEW PREVOTELLA MELANINOGENICA BETA LACTAMASE POSITIVE Performed at [facility], [place] [address], [place] , [place] [address]    Report Status 10/28/2019 FINAL  Final   Organism ID, Bacteria STAPHYLOCOCCUS AUREUS  Final   Organism ID, Bacteria STREPTOCOCCUS ANGINOSIS  Final      Susceptibility   Staphylococcus aureus - MIC*    CIPROFLOXACIN >=8 RESISTANT Resistant     ERYTHROMYCIN >=8 RESISTANT Resistant     GENTAMICIN <=0.5 SENSITIVE Sensitive     OXACILLIN 0.5 SENSITIVE Sensitive     TETRACYCLINE >=16 RESISTANT Resistant     VANCOMYCIN <=0.5 SENSITIVE Sensitive     TRIMETH/SULFA <=10 SENSITIVE Sensitive     CLINDAMYCIN RESISTANT Resistant     RIFAMPIN <=0.5 SENSITIVE Sensitive     Inducible Clindamycin POSITIVE Resistant     * MODERATE STAPHYLOCOCCUS AUREUS   Streptococcus anginosis - MIC*    PENICILLIN <=0.06 SENSITIVE Sensitive     CEFTRIAXONE <=0.12 SENSITIVE Sensitive     ERYTHROMYCIN >=8 RESISTANT Resistant     LEVOFLOXACIN <=0.25 SENSITIVE Sensitive     VANCOMYCIN 0.5 SENSITIVE Sensitive     * FEW STREPTOCOCCUS ANGINOSIS  Culture, blood (routine x 2)     Status: Abnormal   Collection Time: 10/24/19  6:33 PM   Specimen: BLOOD RIGHT ARM  Result Value Ref Range Status   Specimen Description   Final    BLOOD RIGHT ARM Performed at [facility], [address] [place], [place] [address]    Special Requests   Final    BOTTLES DRAWN AEROBIC AND ANAEROBIC Blood Culture adequate  volume Performed at [facility], [address] [place], [place] [address]    Culture  Setup Time   Final    IN BOTH AEROBIC AND ANAEROBIC BOTTLES CORRECTED ON 12/22 AT 0140: PREVIOUSLY REPORTED AS GRAM POSITIVE COCCI ANAEROBIC BOTTLE ONLY Gram Stain Report Called to,Read Back By and Verified With: [email or web address] [id number] ON [id number] BY HENDERSON L. CRITICAL RESULT CALLED TO, READ BACK BY AND VERIFIED WITH: E. MURPHY, RN ([facility]) AT 5638 ON 10/25/19 BY C. JESSUP, MT.    Culture (A)  Final    STAPHYLOCOCCUS AUREUS SUSCEPTIBILITIES PERFORMED ON PREVIOUS CULTURE WITHIN THE LAST 5 DAYS. Performed at [facility], [place] [address], [place], [place] [address]    Report Status 10/27/2019 FINAL  Final  Blood Culture ID Panel (Reflexed)     Status: Abnormal   Collection Time: 10/24/19  6:33 PM  Result Value Ref Range Status   Enterococcus species NOT DETECTED NOT DETECTED Final   Listeria monocytogenes NOT DETECTED NOT DETECTED Final   Staphylococcus species DETECTED (A) NOT DETECTED Final    Comment: CRITICAL RESULT CALLED TO, READ BACK BY AND VERIFIED WITH: E. MURPHY, RN (APH) AT 1820 ON 10/25/19 BY C. JESSUP, MT.    Staphylococcus aureus (BCID) DETECTED (A) NOT DETECTED Final    Comment: Methicillin (oxacillin) susceptible Staphylococcus aureus (MSSA). Preferred therapy is anti staphylococcal beta lactam antibiotic (Cefazolin or Nafcillin), unless clinically contraindicated. CRITICAL RESULT CALLED TO, READ BACK BY AND VERIFIED WITH: E. MURPHY, RN (APH) AT 9643 ON 10/25/19 BY C. JESSUP, MT.    Methicillin resistance NOT DETECTED NOT DETECTED Final   Streptococcus species NOT DETECTED NOT DETECTED Final   Streptococcus agalactiae NOT DETECTED NOT DETECTED Final   Streptococcus pneumoniae NOT DETECTED NOT DETECTED Final   Streptococcus pyogenes NOT DETECTED NOT DETECTED Final   Acinetobacter baumannii NOT DETECTED NOT DETECTED Final   Enterobacteriaceae species NOT DETECTED NOT DETECTED Final    Enterobacter cloacae complex NOT DETECTED NOT DETECTED Final   Escherichia coli NOT DETECTED NOT DETECTED Final   Klebsiella oxytoca NOT DETECTED NOT DETECTED Final   Klebsiella pneumoniae NOT DETECTED NOT DETECTED Final   Proteus species NOT DETECTED NOT DETECTED Final   Serratia marcescens NOT DETECTED NOT DETECTED Final   Haemophilus influenzae NOT DETECTED NOT DETECTED Final   Neisseria meningitidis NOT DETECTED NOT DETECTED Final   Pseudomonas aeruginosa NOT DETECTED NOT DETECTED Final   Candida albicans NOT DETECTED NOT DETECTED Final   Candida glabrata NOT DETECTED NOT DETECTED Final   Candida krusei NOT DETECTED NOT DETECTED Final   Candida parapsilosis NOT DETECTED NOT DETECTED Final   Candida tropicalis NOT DETECTED NOT DETECTED Final    Comment: Performed at [facility], [place] [address] [place], [place] [address]  Culture, blood (routine x 2)     Status: Abnormal   Collection Time: 10/24/19  6:38 PM   Specimen: BLOOD RIGHT HAND  Result Value Ref Range Status   Specimen Description   Final    BLOOD RIGHT HAND Performed at [facility], [address] [place], [place] [address]    Special Requests   Final    BOTTLES DRAWN AEROBIC AND ANAEROBIC Blood Culture adequate volume Performed at [facility], [address], [place], [place] [address]    Culture  Setup Time   Final    GRAM POSITIVE COCCI ANAEROBIC BOTTLE ONLY Gram Stain Report Called to,Read Back By and Verified With: HYLTON @ 7703 ON [id number] BY HENDERSON L. CRITICAL VALUE NOTED.  VALUE IS CONSISTENT WITH PREVIOUSLY REPORTED AND CALLED VALUE.    Culture (A)  Final    STAPHYLOCOCCUS AUREUS SUSCEPTIBILITIES PERFORMED ON PREVIOUS CULTURE WITHIN THE LAST 5 DAYS. Performed at [facility], [place] [address], [place], [place] [address]    Report Status 10/27/2019 FINAL  Final  Culture, blood (routine x 2)     Status: None (Preliminary result)   Collection Time: 10/25/19  2:38 PM   Specimen: Right  Antecubital; Blood  Result Value Ref Range Status   Specimen Description   Final    RIGHT ANTECUBITAL BOTTLES DRAWN AEROBIC AND ANAEROBIC Performed at [facility], [address], [place], [place] [address]    Special Requests   Final    Blood Culture adequate volume Performed at [facility], [address] [place], [place] [address]  Culture  Setup Time   Final    ANAEROBIC BOTTLE ONLY GRAM POSITIVE COCCI Gram Stain Report Called to,Read Back By and Verified With: Ivan Croft @0523  10/30/[facility] Performed at [facility], [address], [place], [place] [address]    Culture   Final    CULTURE REINCUBATED FOR BETTER GROWTH CORRECTED ON 12/24 AT 1412: PREVIOUSLY REPORTED AS NO GROWTH 5 DAYS Performed at [facility], [place] [address], [place], [place] [address]    Report Status PENDING  Incomplete  Culture, blood (routine x 2)     Status: None (Preliminary result)   Collection Time: 10/26/19 11:17 AM   Specimen: BLOOD RIGHT ARM  Result Value Ref Range Status   Specimen Description BLOOD RIGHT ARM  Final   Special Requests   Final    BOTTLES DRAWN AEROBIC AND ANAEROBIC Blood Culture adequate volume   Culture   Final    NO GROWTH 4 DAYS Performed at [facility], [address] [place], [place] [address]    Report Status PENDING  Incomplete  Culture, blood (routine x 2)     Status: None (Preliminary result)   Collection Time: 10/26/19 11:17 AM   Specimen: BLOOD LEFT HAND  Result Value Ref Range Status   Specimen Description BLOOD LEFT HAND  Final   Special Requests   Final    BOTTLES DRAWN AEROBIC ONLY Blood Culture adequate volume   Culture   Final    NO GROWTH 4 DAYS Performed at [facility], [address] [place],  [address]    Report Status PENDING  Incomplete     Labs: BNP (last 3 results) No results for input(s): BNP in the last 8760 hours. Basic Metabolic Panel: Recent Labs  Lab 10/24/19 0357 10/25/19 0418 10/26/19 0444 10/27/19 0342  10/28/19 0353 10/29/19 0600  NA 131* 132* 130* 131* 132* 129*  K 3.8 3.9 3.5 3.4* 3.8 4.0  CL 96* 98 96* 93* 91* 91*  CO2 28 28 25 29 29 29   GLUCOSE 242* 211* 198* 160* 112* 184*  BUN 25* 20 18 14 16 16   CREATININE 0.87 0.69 0.69 0.65 0.66 0.81  CALCIUM 8.2* 8.0* 7.8* 7.5* 7.5* 7.5*  MG 1.8 1.8 1.6* 1.9 1.8  --    Liver Function Tests: Recent Labs  Lab 10/24/19 0357  AST 22  ALT 27  ALKPHOS 69  BILITOT 1.3*  PROT 5.4*  ALBUMIN 2.1*   No results for input(s): LIPASE, AMYLASE in the last 168 hours. No results for input(s): AMMONIA in the last 168 hours. CBC: Recent Labs  Lab 10/26/19 0444 10/27/19 0342 10/28/19 0353 10/29/19 0600 10/30/19 0553  WBC 11.9* 16.0* 19.6* 18.7* 17.2*  HGB 12.6* 12.7* 12.7* 12.3* 11.6*  HCT 37.5* 38.4* 38.7* 36.5* 35.2*  MCV 88.9 89.7 91.3 90.6 90.5  PLT 185 256 308 295 299   Cardiac Enzymes: No results for input(s): CKTOTAL, CKMB, CKMBINDEX, TROPONINI in the last 168 hours. BNP: Invalid input(s): POCBNP CBG: Recent Labs  Lab 10/29/19 1152 10/29/19 1612 10/29/19 2126 10/30/19 0824 10/30/19 1147  GLUCAP 99 136* 173* 198* 204*   D-Dimer No results for input(s): DDIMER in the last 72 hours. Hgb A1c No results for input(s): HGBA1C in the last 72 hours. Lipid Profile No results for input(s): CHOL, HDL, LDLCALC, TRIG, CHOLHDL, LDLDIRECT in the last 72 hours. Thyroid function studies No results for input(s): TSH, T4TOTAL, T3FREE, THYROIDAB in the last 72 hours.  Invalid input(s): FREET3 Anemia work up No results  for input(s): VITAMINB12, FOLATE, FERRITIN, TIBC, IRON, RETICCTPCT in the last 72 hours. Urinalysis    Component Value Date/Time   COLORURINE YELLOW 10/22/2019 1508   APPEARANCEUR HAZY (A) 10/22/2019 1508   LABSPEC 1.017 10/22/2019 1508   PHURINE 5.0 10/22/2019 1508   GLUCOSEU >=500 (A) 10/22/2019 1508   HGBUR SMALL (A) 10/22/2019 1508   BILIRUBINUR NEGATIVE 10/22/2019 1508   BILIRUBINUR negative 03/26/2014 1212    KETONESUR 5 (A) 10/22/2019 1508   PROTEINUR NEGATIVE 10/22/2019 1508   UROBILINOGEN 0.2 06/13/2014 1048   NITRITE NEGATIVE 10/22/2019 1508   LEUKOCYTESUR NEGATIVE 10/22/2019 1508   Sepsis Labs Invalid input(s): PROCALCITONIN,  WBC,  LACTICIDVEN Microbiology Recent Results (from the past 240 hour(s))  Urine culture     Status: Abnormal   Collection Time: 10/22/19  3:08 PM   Specimen: In/Out Cath Urine  Result Value Ref Range Status   Specimen Description   Final    IN/OUT CATH URINE Performed at [facility], [address], [place], [place] [address]    Special Requests   Final    NONE Performed at [facility], [address], [place], [place] [address]    Culture (A)  Final    <10,000 COLONIES/mL INSIGNIFICANT GROWTH Performed at [facility], [place] [address], [place], [place] [address]    Report Status 10/23/2019 FINAL  Final  Blood Culture (routine x 2)     Status: Abnormal   Collection Time: 10/22/19  3:20 PM   Specimen: BLOOD LEFT WRIST  Result Value Ref Range Status   Specimen Description   Final    BLOOD LEFT WRIST Performed at [facility], [address] [place], [place] [address]    Special Requests   Final    BOTTLES DRAWN AEROBIC AND ANAEROBIC Blood Culture adequate volume Performed at [facility], [address], [place], [place] [address]    Culture  Setup Time   Final    GRAM POSITIVE COCCI BOTH ANAEROBIC AND AEROBIC BOTTLES Gram Stain Report Called to,Read Back By and Verified With: HEARN,J'@0705'$  BY MATTHEWS, B 12.17.2020 [facility] Performed at [facility], [address] [place], [place] [address]    Culture STAPHYLOCOCCUS AUREUS (A)  Final   Report Status 10/26/2019 FINAL  Final   Organism ID, Bacteria STAPHYLOCOCCUS AUREUS  Final      Susceptibility   Staphylococcus aureus - MIC*    CIPROFLOXACIN >=8 RESISTANT Resistant     ERYTHROMYCIN >=8 RESISTANT Resistant     GENTAMICIN <=0.5 SENSITIVE Sensitive     OXACILLIN 0.5 SENSITIVE  Sensitive     TETRACYCLINE >=16 RESISTANT Resistant     VANCOMYCIN 1 SENSITIVE Sensitive     TRIMETH/SULFA <=10 SENSITIVE Sensitive     CLINDAMYCIN RESISTANT Resistant     RIFAMPIN <=0.5 SENSITIVE Sensitive     Inducible Clindamycin POSITIVE Resistant     * STAPHYLOCOCCUS AUREUS  Blood Culture (routine x 2)     Status: Abnormal   Collection Time: 10/22/19  3:31 PM   Specimen: Right Antecubital; Blood  Result Value Ref Range Status   Specimen Description   Final    RIGHT ANTECUBITAL Performed at [facility], [address] [place], [place] [address]    Special Requests   Final    BOTTLES DRAWN AEROBIC AND ANAEROBIC Blood Culture adequate volume Performed at [facility], [address] [place],  [address]    Culture  Setup Time   Final    GRAM POSITIVE COCCI ANAEROBIC AND AEROBIC  BOTTLES Gram Stain Report Called to,Read Back By and Verified With: HEARN,J@0705  BY MATTHEWS B 12.17.2020 [facility] Performed at [facility], [address] [place], [place] [address]    Culture (A)  Final    STAPHYLOCOCCUS AUREUS SUSCEPTIBILITIES PERFORMED ON PREVIOUS CULTURE WITHIN THE LAST 5 DAYS. Performed at [facility], [place] [address] [place], [place] [address]    Report Status 10/26/2019 FINAL  Final  SARS CORONAVIRUS 2 (TAT 6-24 HRS) Nasopharyngeal Nasopharyngeal Swab     Status: None   Collection Time: 10/22/19  4:14 PM   Specimen: Nasopharyngeal Swab  Result Value Ref Range Status   SARS Coronavirus 2 NEGATIVE NEGATIVE Final    Comment: (NOTE) SARS-CoV-2 target nucleic acids are NOT DETECTED. The SARS-CoV-2 RNA is generally detectable in upper and lower respiratory specimens during the acute phase of infection. Negative results do not preclude SARS-CoV-2 infection, do not rule out co-infections with other pathogens, and should not be used as the sole basis for treatment or other patient management decisions. Negative results must be combined with clinical  observations, patient history, and epidemiological information. The expected result is Negative. Fact Sheet for Patients: SugarRoll.be Fact Sheet for Healthcare Providers: https://www.woods-mathews.com/ This test is not yet approved or cleared by the [place] FDA and  has been authorized for detection and/or diagnosis of SARS-CoV-2 by FDA under an Emergency Use Authorization (EUA). This EUA will remain  in effect (meaning this test can be used) for the duration of the COVID-19 declaration under Section 56 4(b)(1) of the Act, 21 U.S.C. section 360bbb-3(b)(1), unless the authorization is terminated or revoked sooner. Performed at [facility], [address], [place], [place] [address]   MRSA PCR Screening     Status: None   Collection Time: 10/22/19  8:11 PM   Specimen: Nasal Mucosa; Nasopharyngeal  Result Value Ref Range Status   MRSA by PCR NEGATIVE NEGATIVE Final    Comment:        The GeneXpert MRSA Assay (FDA approved for NASAL specimens only), is one component of a comprehensive MRSA colonization surveillance program. It is not intended to diagnose MRSA infection nor to guide or monitor treatment for MRSA infections. Performed at [facility], [address] [place], [place] [address]   C difficile quick scan w PCR reflex     Status: None   Collection Time: 10/22/19 11:56 PM   Specimen: STOOL  Result Value Ref Range Status   C Diff antigen NEGATIVE NEGATIVE Final   C Diff toxin NEGATIVE NEGATIVE Final   C Diff interpretation No C. difficile detected.  Final    Comment: Performed at [facility], [address], [place], [place] [address]  Culture, blood (routine x 2)     Status: None   Collection Time: 10/23/19  8:44 AM   Specimen: BLOOD RIGHT HAND  Result Value Ref Range Status   Specimen Description BLOOD RIGHT HAND  Final   Special Requests   Final    BOTTLES DRAWN AEROBIC AND ANAEROBIC Blood Culture adequate  volume   Culture   Final    NO GROWTH 5 DAYS Performed at [facility], [address] [place], [place] [address]    Report Status 10/28/2019 FINAL  Final  Culture, blood (routine x 2)     Status: None   Collection Time: 10/23/19  8:46 AM   Specimen: BLOOD RIGHT HAND  Result Value Ref Range Status   Specimen Description BLOOD RIGHT HAND  Final  Special Requests   Final    BOTTLES DRAWN AEROBIC AND ANAEROBIC Blood Culture results may not be optimal due to an excessive volume of blood received in culture bottles   Culture   Final    NO GROWTH 5 DAYS Performed at [facility], [address], [place], [place] [address]    Report Status 10/28/2019 FINAL  Final  Aerobic/Anaerobic Culture (surgical/deep wound)     Status: None   Collection Time: 10/24/19  2:20 PM   Specimen: Soft Tissue, Other  Result Value Ref Range Status   Specimen Description   Final    TOE SOFT TISSUE AND BONE OF 3RD Performed at [facility], [address], [place], [place] [address]    Special Requests   Final    NONE Performed at [facility], [address], [place], [place] [address]    Gram Stain NO WBC SEEN FEW GRAM POSITIVE COCCI   Final   Culture   Final    MODERATE STAPHYLOCOCCUS AUREUS FEW STREPTOCOCCUS ANGINOSIS FEW PREVOTELLA MELANINOGENICA BETA LACTAMASE POSITIVE Performed at [facility], [place] [address] [place], [place] [address]    Report Status 10/28/2019 FINAL  Final   Organism ID, Bacteria STAPHYLOCOCCUS AUREUS  Final   Organism ID, Bacteria STREPTOCOCCUS ANGINOSIS  Final      Susceptibility   Staphylococcus aureus - MIC*    CIPROFLOXACIN >=8 RESISTANT Resistant     ERYTHROMYCIN >=8 RESISTANT Resistant     GENTAMICIN <=0.5 SENSITIVE Sensitive     OXACILLIN 0.5 SENSITIVE Sensitive     TETRACYCLINE >=16 RESISTANT Resistant     VANCOMYCIN <=0.5 SENSITIVE Sensitive     TRIMETH/SULFA <=10 SENSITIVE Sensitive     CLINDAMYCIN RESISTANT Resistant     RIFAMPIN <=0.5 SENSITIVE  Sensitive     Inducible Clindamycin POSITIVE Resistant     * MODERATE STAPHYLOCOCCUS AUREUS   Streptococcus anginosis - MIC*    PENICILLIN <=0.06 SENSITIVE Sensitive     CEFTRIAXONE <=0.12 SENSITIVE Sensitive     ERYTHROMYCIN >=8 RESISTANT Resistant     LEVOFLOXACIN <=0.25 SENSITIVE Sensitive     VANCOMYCIN 0.5 SENSITIVE Sensitive     * FEW STREPTOCOCCUS ANGINOSIS  Culture, blood (routine x 2)     Status: Abnormal   Collection Time: 10/24/19  6:33 PM   Specimen: BLOOD RIGHT ARM  Result Value Ref Range Status   Specimen Description   Final    BLOOD RIGHT ARM Performed at [facility], [address] [place], [place] [address]    Special Requests   Final    BOTTLES DRAWN AEROBIC AND ANAEROBIC Blood Culture adequate volume Performed at [facility], [address] [place], [place] [address]    Culture  Setup Time   Final    IN BOTH AEROBIC AND ANAEROBIC BOTTLES CORRECTED ON 12/22 AT 0140: PREVIOUSLY REPORTED AS GRAM POSITIVE COCCI ANAEROBIC BOTTLE ONLY Gram Stain Report Called to,Read Back By and Verified With: GYBWLS [id number] ON [id number] BY HENDERSON L. CRITICAL RESULT CALLED TO, READ BACK BY AND VERIFIED WITH: E. MURPHY, RN (APH) AT 5726 ON 10/25/19 BY C. JESSUP, MT.    Culture (A)  Final    STAPHYLOCOCCUS AUREUS SUSCEPTIBILITIES PERFORMED ON PREVIOUS CULTURE WITHIN THE LAST 5 DAYS. Performed at [facility], [place] [address], [place],  [address]    Report Status 10/27/2019 FINAL  Final  Blood Culture ID Panel (Reflexed)     Status: Abnormal   Collection Time: 10/24/19  6:33 PM  Result Value Ref Range Status   Enterococcus species NOT DETECTED NOT DETECTED Final   Listeria monocytogenes NOT DETECTED NOT DETECTED Final   Staphylococcus species DETECTED (A) NOT DETECTED Final    Comment: CRITICAL RESULT CALLED TO, READ BACK BY AND VERIFIED WITH: E. MURPHY, RN (APH) AT 1820 ON 10/25/19 BY C. JESSUP, MT.    Staphylococcus aureus (BCID) DETECTED (A) NOT DETECTED Final     Comment: Methicillin (oxacillin) susceptible Staphylococcus aureus (MSSA). Preferred therapy is anti staphylococcal beta lactam antibiotic (Cefazolin or Nafcillin), unless clinically contraindicated. CRITICAL RESULT CALLED TO, READ BACK BY AND VERIFIED WITH: E. MURPHY, RN (APH) AT 1610 ON 10/25/19 BY C. JESSUP, MT.    Methicillin resistance NOT DETECTED NOT DETECTED Final   Streptococcus species NOT DETECTED NOT DETECTED Final   Streptococcus agalactiae NOT DETECTED NOT DETECTED Final   Streptococcus pneumoniae NOT DETECTED NOT DETECTED Final   Streptococcus pyogenes NOT DETECTED NOT DETECTED Final   Acinetobacter baumannii NOT DETECTED NOT DETECTED Final   Enterobacteriaceae species NOT DETECTED NOT DETECTED Final   Enterobacter cloacae complex NOT DETECTED NOT DETECTED Final   Escherichia coli NOT DETECTED NOT DETECTED Final   Klebsiella oxytoca NOT DETECTED NOT DETECTED Final   Klebsiella pneumoniae NOT DETECTED NOT DETECTED Final   Proteus species NOT DETECTED NOT DETECTED Final   Serratia marcescens NOT DETECTED NOT DETECTED Final   Haemophilus influenzae NOT DETECTED NOT DETECTED Final   Neisseria meningitidis NOT DETECTED NOT DETECTED Final   Pseudomonas aeruginosa NOT DETECTED NOT DETECTED Final   Candida albicans NOT DETECTED NOT DETECTED Final   Candida glabrata NOT DETECTED NOT DETECTED Final   Candida krusei NOT DETECTED NOT DETECTED Final   Candida parapsilosis NOT DETECTED NOT DETECTED Final   Candida tropicalis NOT DETECTED NOT DETECTED Final    Comment: Performed at [facility], [place] [address], [place], [place] [address]  Culture, blood (routine x 2)     Status: Abnormal   Collection Time: 10/24/19  6:38 PM   Specimen: BLOOD RIGHT HAND  Result Value Ref Range Status   Specimen Description   Final    BLOOD RIGHT HAND Performed at [facility], [address] [place], [place] [address]    Special Requests   Final    BOTTLES DRAWN AEROBIC AND ANAEROBIC Blood  Culture adequate volume Performed at [facility], [address] [place], [place] [address]    Culture  Setup Time   Final    GRAM POSITIVE COCCI ANAEROBIC BOTTLE ONLY Gram Stain Report Called to,Read Back By and Verified With: HYLTON @ 8295 ON [id number] BY HENDERSON L. CRITICAL VALUE NOTED.  VALUE IS CONSISTENT WITH PREVIOUSLY REPORTED AND CALLED VALUE.    Culture (A)  Final    STAPHYLOCOCCUS AUREUS SUSCEPTIBILITIES PERFORMED ON PREVIOUS CULTURE WITHIN THE LAST 5 DAYS. Performed at [facility], [place] [address] [place], [place] [address]    Report Status 10/27/2019 FINAL  Final  Culture, blood (routine x 2)     Status: None (Preliminary result)   Collection Time: 10/25/19  2:38 PM   Specimen: Right Antecubital; Blood  Result Value Ref Range Status   Specimen Description   Final    RIGHT ANTECUBITAL BOTTLES DRAWN AEROBIC AND ANAEROBIC Performed at [facility], [address], [place], [place] [address]    Special Requests   Final    Blood Culture adequate volume Performed at [facility], [address], [place], [place] [address]    Culture  Setup  Time   Final    ANAEROBIC BOTTLE ONLY GRAM POSITIVE COCCI Gram Stain Report Called to,Read Back By and Verified With: Ivan Croft @0523  10/30/19 [facility] Performed at [facility] [address], [place], [place] [address]    Culture   Final    CULTURE REINCUBATED FOR BETTER GROWTH CORRECTED ON 12/24 AT 1412: PREVIOUSLY REPORTED AS NO GROWTH 5 DAYS Performed at [facility], [place] [address], [place], [place] [address]    Report Status PENDING  Incomplete  Culture, blood (routine x 2)     Status: None (Preliminary result)   Collection Time: 10/26/19 11:17 AM   Specimen: BLOOD RIGHT ARM  Result Value Ref Range Status   Specimen Description BLOOD RIGHT ARM  Final   Special Requests   Final    BOTTLES DRAWN AEROBIC AND ANAEROBIC Blood Culture adequate volume   Culture   Final    NO GROWTH 4 DAYS Performed at [facility], [address], [place], [place] [address]    Report Status PENDING  Incomplete  Culture, blood (routine x 2)     Status: None (Preliminary result)   Collection Time: 10/26/19 11:17 AM   Specimen: BLOOD LEFT HAND  Result Value Ref Range Status   Specimen Description BLOOD LEFT HAND  Final   Special Requests   Final    BOTTLES DRAWN AEROBIC ONLY Blood Culture adequate volume   Culture   Final    NO GROWTH 4 DAYS Performed at [facility], [address] [place], [place] [address]    Report Status PENDING  Incomplete     Time coordinating discharge: 39mns  SIGNED:   JKathie Dike MD  [facility] 10/30/2019, 8:30 PM   If 7PM-7AM, please contact night-coverage [email or web address]

## 2019-10-30 NOTE — Progress Notes (Signed)
Nsg Discharge Note  Admit Date:  10/22/2019 Discharge date: 10/30/2019   [name] to be D/C'd home per MD order.  AVS completed.  Copy for chart, and copy for patient signed, and dated. Patient/caregiver able to verbalize understanding.  Discharge Medication: Allergies as of 10/30/2019   No Known Allergies     Medication List    STOP taking these medications   doxycycline 100 MG tablet Commonly known as: VIBRA-TABS   ibuprofen 200 MG tablet Commonly known as: ADVIL     TAKE these medications   albuterol 108 (90 Base) MCG/ACT inhaler Commonly known as: VENTOLIN HFA Inhale 2 puffs into the lungs every 6 (six) hours as needed for wheezing or shortness of breath.   amiodarone 200 MG tablet Commonly known as: PACERONE Take 1 tablet (200 mg total) by mouth 2 (two) times daily.   apixaban 5 MG Tabs tablet Commonly known as: ELIQUIS Take 78m po bid for 4 more days then 57mpo bid   aspirin EC 81 MG EC tablet Generic drug: aspirin Take 81 mg by mouth daily. Swallow whole.   atorvastatin 20 MG tablet Commonly known as: LIPITOR Take 1 tablet (20 mg total) by mouth daily.   ceFAZolin  IVPB Commonly known as: ANCEF Inject 2 g into the vein every 8 (eight) hours for 10 days. Indication:  MSSA bacteremia  Last Day of Therapy:  11/08/2019 Labs - Once weekly:  CBC/D and BMP, Labs - Every other week:  ESR and CRP   cyclobenzaprine 10 MG tablet Commonly known as: FLEXERIL Take 1 tablet (10 mg total) by mouth at bedtime as needed for muscle spasms.   diltiazem 120 MG 24 hr capsule Commonly known as: CARDIZEM CD Take 1 capsule (120 mg total) by mouth daily. Start taking on: October 31, 2019   DULoxetine 60 MG capsule Commonly known as: Cymbalta Take 1 capsule (60 mg total) by mouth daily.   fluticasone 50 MCG/ACT nasal spray Commonly known as: FLONASE Place 1 spray into both nostrils 2 (two) times daily as needed for allergies or rhinitis.   FreeStyle Libre 14 Day  Reader DeKerrin Mo each by Does not apply route daily.   FreeStyle Libre 14 Day Sensor Misc 1 each by Other route every 14 (fourteen) days.   gabapentin 600 MG tablet Commonly known as: NEURONTIN Take 1 tablet (600 mg total) by mouth at bedtime.   HYDROcodone-acetaminophen 10-325 MG tablet Commonly known as: NORCO Take 1 tablet by mouth 2 (two) times daily as needed for moderate pain. What changed: Another medication with the same name was removed. Continue taking this medication, and follow the directions you see here.   lisinopril 5 MG tablet Commonly known as: ZESTRIL Take 1 tablet (5 mg total) by mouth daily.   metFORMIN 1000 MG tablet Commonly known as: GLUCOPHAGE Take 1 tablet (1,000 mg total) by mouth 2 (two) times daily.   metroNIDAZOLE 500 MG tablet Commonly known as: Flagyl Take 1 tablet (500 mg total) by mouth 3 (three) times daily for 9 days.   multivitamin with minerals tablet Take 1 tablet by mouth daily.   terbinafine 1 % cream Commonly known as: LamISIL AT Apply 1 application topically 2 (two) times daily.   Toujeo Max SoloStar 300 UNIT/ML Sopn Generic drug: Insulin Glargine (2 Unit Dial) Inject 40-50 Units into the skin daily.   zinc gluconate 50 MG tablet Take 50 mg by mouth daily.            Home  Infusion Instuctions  (From admission, onward)         Start     Ordered   10/30/19 0000  Home infusion instructions    Question:  Instructions  Answer:  Flushing of vascular access device: 0.9% NaCl pre/post medication administration and prn patency; Heparin 100 u/ml, 9m for implanted ports and Heparin 10u/ml, 51mfor all other central venous catheters.   10/30/19 1109          Discharge Assessment: Vitals:   10/30/19 0750 10/30/19 1417  BP:  139/69  Pulse:  83  Resp:  20  Temp:  98.6 F (37 C)  SpO2: 97% 97%   Skin clean, dry and intact without evidence of skin break down, no evidence of skin tears noted. IV catheter discontinued intact.  Site without signs and symptoms of complications - no redness or edema noted at insertion site, patient denies c/o pain - only slight tenderness at site.  Dressing with slight pressure applied.  D/c Instructions-Education: Discharge instructions given to patient/family with verbalized understanding. D/c education completed with patient/family including follow up instructions, medication list, d/c activities limitations if indicated, with other d/c instructions as indicated by MD - patient able to verbalize understanding, all questions fully answered. Patient instructed to return to ED, call 911, or call MD for any changes in condition.  Patient escorted via [facility] [place] home via private auto.  BrCarney CornersRN 10/30/2019 5:13 PM

## 2019-10-30 NOTE — Progress Notes (Signed)
Spoke with IV team nurse who is coming to do education for the patient regarding his home health antibiotics. States she will not be here until around 1500, to not let the patient be discharged until she came and educated him. Will continue to monitor.

## 2019-10-31 LAB — CULTURE, BLOOD (ROUTINE X 2)
Culture: NO GROWTH
Culture: NO GROWTH
Special Requests: ADEQUATE
Special Requests: ADEQUATE

## 2019-11-01 LAB — CULTURE, BLOOD (ROUTINE X 2): Special Requests: ADEQUATE

## 2019-11-03 ENCOUNTER — Telehealth: Payer: Self-pay | Admitting: Family Medicine

## 2019-11-03 NOTE — Telephone Encounter (Signed)
Patient aware can not refill

## 2019-11-03 NOTE — Telephone Encounter (Signed)
What is the name of the medication? HYDROcodone-acetaminophen (NORCO) 10-325 MG tablet   Have you contacted your pharmacy to request a refill? YES  Which pharmacy would you like this sent to? [facility]    Patient notified that their request is being sent to the clinical staff for review and that they should receive a call once it is complete. If they do not receive a call within 24 hours they can check with their pharmacy or our office.

## 2019-11-04 ENCOUNTER — Inpatient Hospital Stay (HOSPITAL_COMMUNITY)
Admission: AD | Admit: 2019-11-04 | Discharge: 2019-11-08 | DRG: 623 | Disposition: A | Discharge status: Home Health | Payer: Medicare Other | Source: Ambulatory Visit | Attending: Internal Medicine | Admitting: Internal Medicine

## 2019-11-04 ENCOUNTER — Inpatient Hospital Stay (HOSPITAL_COMMUNITY): Payer: Medicare Other

## 2019-11-04 DIAGNOSIS — I480 Paroxysmal atrial fibrillation: Secondary | ICD-10-CM | POA: Diagnosis present

## 2019-11-04 DIAGNOSIS — F329 Major depressive disorder, single episode, unspecified: Secondary | ICD-10-CM | POA: Diagnosis present

## 2019-11-04 DIAGNOSIS — Z794 Long term (current) use of insulin: Secondary | ICD-10-CM

## 2019-11-04 DIAGNOSIS — L03115 Cellulitis of right lower limb: Secondary | ICD-10-CM | POA: Diagnosis present

## 2019-11-04 DIAGNOSIS — L03119 Cellulitis of unspecified part of limb: Secondary | ICD-10-CM

## 2019-11-04 DIAGNOSIS — L02419 Cutaneous abscess of limb, unspecified: Secondary | ICD-10-CM

## 2019-11-04 DIAGNOSIS — I10 Essential (primary) hypertension: Secondary | ICD-10-CM | POA: Diagnosis present

## 2019-11-04 DIAGNOSIS — E11610 Type 2 diabetes mellitus with diabetic neuropathic arthropathy: Secondary | ICD-10-CM | POA: Diagnosis present

## 2019-11-04 DIAGNOSIS — E785 Hyperlipidemia, unspecified: Secondary | ICD-10-CM | POA: Diagnosis present

## 2019-11-04 DIAGNOSIS — E1151 Type 2 diabetes mellitus with diabetic peripheral angiopathy without gangrene: Secondary | ICD-10-CM | POA: Diagnosis present

## 2019-11-04 DIAGNOSIS — L02415 Cutaneous abscess of right lower limb: Secondary | ICD-10-CM | POA: Diagnosis present

## 2019-11-04 DIAGNOSIS — I82431 Acute embolism and thrombosis of right popliteal vein: Secondary | ICD-10-CM | POA: Diagnosis present

## 2019-11-04 DIAGNOSIS — Z20822 Contact with and (suspected) exposure to covid-19: Secondary | ICD-10-CM | POA: Diagnosis present

## 2019-11-04 DIAGNOSIS — G8929 Other chronic pain: Secondary | ICD-10-CM | POA: Diagnosis present

## 2019-11-04 DIAGNOSIS — L02619 Cutaneous abscess of unspecified foot: Secondary | ICD-10-CM

## 2019-11-04 DIAGNOSIS — Z833 Family history of diabetes mellitus: Secondary | ICD-10-CM

## 2019-11-04 DIAGNOSIS — Z86718 Personal history of other venous thrombosis and embolism: Secondary | ICD-10-CM | POA: Diagnosis present

## 2019-11-04 DIAGNOSIS — Z79899 Other long term (current) drug therapy: Secondary | ICD-10-CM

## 2019-11-04 DIAGNOSIS — I4891 Unspecified atrial fibrillation: Secondary | ICD-10-CM

## 2019-11-04 DIAGNOSIS — Z89512 Acquired absence of left leg below knee: Secondary | ICD-10-CM | POA: Diagnosis not present

## 2019-11-04 DIAGNOSIS — E11628 Type 2 diabetes mellitus with other skin complications: Secondary | ICD-10-CM | POA: Diagnosis present

## 2019-11-04 DIAGNOSIS — Z7901 Long term (current) use of anticoagulants: Secondary | ICD-10-CM | POA: Diagnosis not present

## 2019-11-04 DIAGNOSIS — E1165 Type 2 diabetes mellitus with hyperglycemia: Secondary | ICD-10-CM | POA: Diagnosis present

## 2019-11-04 DIAGNOSIS — I4811 Longstanding persistent atrial fibrillation: Secondary | ICD-10-CM

## 2019-11-04 DIAGNOSIS — L0390 Cellulitis, unspecified: Secondary | ICD-10-CM | POA: Diagnosis present

## 2019-11-04 DIAGNOSIS — E1142 Type 2 diabetes mellitus with diabetic polyneuropathy: Secondary | ICD-10-CM | POA: Diagnosis present

## 2019-11-04 DIAGNOSIS — E119 Type 2 diabetes mellitus without complications: Secondary | ICD-10-CM

## 2019-11-04 DIAGNOSIS — Z89421 Acquired absence of other right toe(s): Secondary | ICD-10-CM | POA: Diagnosis not present

## 2019-11-04 DIAGNOSIS — Z7982 Long term (current) use of aspirin: Secondary | ICD-10-CM | POA: Diagnosis not present

## 2019-11-04 DIAGNOSIS — D62 Acute posthemorrhagic anemia: Secondary | ICD-10-CM | POA: Diagnosis not present

## 2019-11-04 DIAGNOSIS — M14671 Charcot's joint, right ankle and foot: Secondary | ICD-10-CM

## 2019-11-04 DIAGNOSIS — M869 Osteomyelitis, unspecified: Secondary | ICD-10-CM | POA: Diagnosis present

## 2019-11-04 LAB — CBC WITH DIFFERENTIAL/PLATELET
Abs Immature Granulocytes: 0.08 10*3/uL — ABNORMAL HIGH (ref 0.00–0.07)
Basophils Absolute: 0 10*3/uL (ref 0.0–0.1)
Basophils Relative: 0 %
Eosinophils Absolute: 0 10*3/uL (ref 0.0–0.5)
Eosinophils Relative: 0 %
HCT: 31.4 % — ABNORMAL LOW (ref 39.0–52.0)
Hemoglobin: 10 g/dL — ABNORMAL LOW (ref 13.0–17.0)
Immature Granulocytes: 1 %
Lymphocytes Relative: 9 %
Lymphs Abs: 1 10*3/uL (ref 0.7–4.0)
MCH: 29.9 pg (ref 26.0–34.0)
MCHC: 31.8 g/dL (ref 30.0–36.0)
MCV: 93.7 fL (ref 80.0–100.0)
Monocytes Absolute: 0.6 10*3/uL (ref 0.1–1.0)
Monocytes Relative: 5 %
Neutro Abs: 9.1 10*3/uL — ABNORMAL HIGH (ref 1.7–7.7)
Neutrophils Relative %: 85 %
Platelets: 428 10*3/uL — ABNORMAL HIGH (ref 150–400)
RBC: 3.35 MIL/uL — ABNORMAL LOW (ref 4.22–5.81)
RDW: 12.9 % (ref 11.5–15.5)
WBC: 10.8 10*3/uL — ABNORMAL HIGH (ref 4.0–10.5)
nRBC: 0 % (ref 0.0–0.2)

## 2019-11-04 LAB — COMPREHENSIVE METABOLIC PANEL
ALT: 17 U/L (ref 0–44)
AST: 18 U/L (ref 15–41)
Albumin: 1.8 g/dL — ABNORMAL LOW (ref 3.5–5.0)
Alkaline Phosphatase: 106 U/L (ref 38–126)
Anion gap: 9 (ref 5–15)
BUN: 8 mg/dL (ref 6–20)
CO2: 27 mmol/L (ref 22–32)
Calcium: 7.8 mg/dL — ABNORMAL LOW (ref 8.9–10.3)
Chloride: 90 mmol/L — ABNORMAL LOW (ref 98–111)
Creatinine, Ser: 0.73 mg/dL (ref 0.61–1.24)
GFR calc Af Amer: >60 mL/min (ref >60)
GFR calc non Af Amer: >60 mL/min (ref >60)
Glucose, Bld: 284 mg/dL — ABNORMAL HIGH (ref 70–99)
Potassium: 3.6 mmol/L (ref 3.5–5.1)
Sodium: 126 mmol/L — ABNORMAL LOW (ref 135–145)
Total Bilirubin: 0.9 mg/dL (ref 0.3–1.2)
Total Protein: 6.4 g/dL — ABNORMAL LOW (ref 6.5–8.1)

## 2019-11-04 LAB — PHOSPHORUS: Phosphorus: 2.8 mg/dL (ref 2.5–4.6)

## 2019-11-04 LAB — GLUCOSE, CAPILLARY: Glucose-Capillary: 163 mg/dL — ABNORMAL HIGH (ref 70–99)

## 2019-11-04 LAB — MAGNESIUM: Magnesium: 1.4 mg/dL — ABNORMAL LOW (ref 1.7–2.4)

## 2019-11-04 MED ORDER — CYCLOBENZAPRINE HCL 10 MG PO TABS: 10.0000 mg | ORAL_TABLET | Freq: Every evening | ORAL | Status: DC | PRN

## 2019-11-04 MED ORDER — AMIODARONE HCL 200 MG PO TABS
200.0000 mg | ORAL_TABLET | Freq: Two times a day (BID) | ORAL | Status: DC
  Administered 2019-11-04 – 2019-11-08 (×8): 200 mg via ORAL
  Filled 2019-11-04 (×8): qty 1

## 2019-11-04 MED ORDER — ACETAMINOPHEN 650 MG RE SUPP: 650.0000 mg | Freq: Four times a day (QID) | RECTAL | Status: DC | PRN

## 2019-11-04 MED ORDER — ACETAMINOPHEN 325 MG PO TABS: 650.0000 mg | ORAL_TABLET | Freq: Four times a day (QID) | ORAL | Status: DC | PRN

## 2019-11-04 MED ORDER — MORPHINE SULFATE (PF) 4 MG/ML IV SOLN: 4.0000 mg | INTRAVENOUS | Status: DC | PRN

## 2019-11-04 MED ORDER — VANCOMYCIN HCL IN DEXTROSE 1-5 GM/200ML-% IV SOLN
1000.0000 mg | Freq: Once | INTRAVENOUS | Status: AC
  Administered 2019-11-04: 1000 mg via INTRAVENOUS
  Filled 2019-11-04: qty 200

## 2019-11-04 MED ORDER — HYDROCODONE-ACETAMINOPHEN 10-325 MG PO TABS
1.0000 | ORAL_TABLET | Freq: Two times a day (BID) | ORAL | Status: DC | PRN
  Administered 2019-11-06: 1 via ORAL
  Filled 2019-11-04: qty 1

## 2019-11-04 MED ORDER — VANCOMYCIN HCL IN DEXTROSE 1-5 GM/200ML-% IV SOLN
1000.0000 mg | Freq: Three times a day (TID) | INTRAVENOUS | Status: DC
  Administered 2019-11-05 – 2019-11-08 (×10): 1000 mg via INTRAVENOUS
  Filled 2019-11-04 (×11): qty 200

## 2019-11-04 MED ORDER — SODIUM CHLORIDE 0.9 % IV SOLN: INTRAVENOUS | Status: DC

## 2019-11-04 MED ORDER — INSULIN ASPART 100 UNIT/ML ~~LOC~~ SOLN
0.0000 [IU] | Freq: Three times a day (TID) | SUBCUTANEOUS | Status: DC
  Administered 2019-11-05 – 2019-11-06 (×2): 3 [IU] via SUBCUTANEOUS
  Administered 2019-11-06 (×2): 2 [IU] via SUBCUTANEOUS
  Administered 2019-11-07 (×2): 3 [IU] via SUBCUTANEOUS
  Administered 2019-11-07: 2 [IU] via SUBCUTANEOUS
  Administered 2019-11-08 (×3): 3 [IU] via SUBCUTANEOUS

## 2019-11-04 MED ORDER — ATORVASTATIN CALCIUM 20 MG PO TABS
20.0000 mg | ORAL_TABLET | Freq: Every day | ORAL | Status: DC
  Administered 2019-11-04 – 2019-11-08 (×5): 20 mg via ORAL
  Filled 2019-11-04 (×5): qty 1

## 2019-11-04 MED ORDER — DILTIAZEM HCL ER COATED BEADS 120 MG PO CP24
120.0000 mg | ORAL_CAPSULE | Freq: Every day | ORAL | Status: DC
  Administered 2019-11-04 – 2019-11-08 (×5): 120 mg via ORAL
  Filled 2019-11-04 (×5): qty 1

## 2019-11-04 MED ORDER — DIPHENHYDRAMINE HCL 50 MG/ML IJ SOLN
25.0000 mg | Freq: Three times a day (TID) | INTRAMUSCULAR | Status: DC | PRN
  Administered 2019-11-04 – 2019-11-08 (×3): 50 mg via INTRAVENOUS
  Filled 2019-11-04 (×3): qty 1

## 2019-11-04 MED ORDER — DULOXETINE HCL 60 MG PO CPEP
60.0000 mg | ORAL_CAPSULE | Freq: Every day | ORAL | Status: DC
  Administered 2019-11-04 – 2019-11-08 (×5): 60 mg via ORAL
  Filled 2019-11-04 (×5): qty 1

## 2019-11-04 MED ORDER — INSULIN ASPART 100 UNIT/ML ~~LOC~~ SOLN
0.0000 [IU] | Freq: Every day | SUBCUTANEOUS | Status: DC
  Administered 2019-11-04 – 2019-11-06 (×3): 2 [IU] via SUBCUTANEOUS
  Administered 2019-11-07: 3 [IU] via SUBCUTANEOUS

## 2019-11-04 MED ORDER — ONDANSETRON HCL 4 MG/2ML IJ SOLN: 4.0000 mg | Freq: Four times a day (QID) | INTRAMUSCULAR | Status: DC | PRN

## 2019-11-04 MED ORDER — ONDANSETRON HCL 4 MG PO TABS: 4.0000 mg | ORAL_TABLET | Freq: Four times a day (QID) | ORAL | Status: DC | PRN

## 2019-11-04 MED ORDER — ASPIRIN EC 81 MG PO TBEC
81.0000 mg | DELAYED_RELEASE_TABLET | Freq: Every day | ORAL | Status: DC
  Administered 2019-11-04 – 2019-11-08 (×5): 81 mg via ORAL
  Filled 2019-11-04 (×5): qty 1

## 2019-11-04 MED ORDER — HYDRALAZINE HCL 20 MG/ML IJ SOLN: 5.0000 mg | INTRAMUSCULAR | Status: DC | PRN

## 2019-11-04 MED ORDER — GABAPENTIN 300 MG PO CAPS
600.0000 mg | ORAL_CAPSULE | Freq: Every day | ORAL | Status: DC
  Administered 2019-11-04 – 2019-11-07 (×4): 600 mg via ORAL
  Filled 2019-11-04 (×4): qty 2

## 2019-11-04 MED ORDER — ALBUTEROL SULFATE (2.5 MG/3ML) 0.083% IN NEBU: 2.5000 mg | INHALATION_SOLUTION | Freq: Four times a day (QID) | RESPIRATORY_TRACT | Status: DC | PRN

## 2019-11-04 MED ORDER — ALBUTEROL SULFATE HFA 108 (90 BASE) MCG/ACT IN AERS: 2.0000 | INHALATION_SPRAY | Freq: Four times a day (QID) | RESPIRATORY_TRACT | Status: DC | PRN

## 2019-11-04 NOTE — Progress Notes (Signed)
Pharmacy Antibiotic Note  [name] is a 53 y.o. male admitted on 11/04/2019 with cellulitis.  Pharmacy has been consulted for vancomycin dosing.  Plan: Vancomycin 2gm iv x 1 followed by vancomycin 1gm iv q8h   Height: 6\' 4"  (193 cm) Weight: 198 lb 6.6 oz (90 kg) IBW/kg (Calculated) : 86.8  Temp (24hrs), Avg:98.8 F (37.1 C), Min:98.8 F (37.1 C), Max:98.8 F (37.1 C)  Recent Labs  Lab 10/29/19 0600 10/30/19 0553  WBC 18.7* 17.2*  CREATININE 0.81  --     Estimated Creatinine Clearance: 126.5 mL/min (by C-G formula based on SCr of 0.81 mg/dL).    No Known Allergies  Antimicrobials this admission: 12/29 vancomycin >>  Microbiology results: 12/29 [id number]:[id number]    Thank you for allowing pharmacy to be a part of this patient's care.  Donna Christen Ezio Wieck 11/04/2019 8:13 PM

## 2019-11-04 NOTE — Plan of Care (Signed)

## 2019-11-04 NOTE — H&P (Signed)
History and Physical    [name] [name] DOB: Mar 21, 1966 DOA: 11/04/2019  PCP: Dettinger, [name], MD Patient coming from: Podiatry office - Dr [name] Chief Complaint: Foot infection  HPI: [name] is a 53 y.o. male with medical history significant of HTN, Afib RVR, DVT, DM, Osteomyelitis, L BKA, R partial ray amputations. Of note pt was DC on 10/30/19 after admission for Sepsis w/ bacteremia from R foot infection and osteo OSSA and S. ANginosus and possible anerobes on culture. Pt reports his health being unchanged since time of DC. Endorses compliance w/ his PICC ABX regimen. At time of f/u today pt reports being told he would need surgical washout of wound and possible abscess and sent to [facility] for admission. Discharged on Ancef and Metronidazole at time of last admission.   ED Course: NA  Review of Systems: As per HPI otherwise all other systems reviewed and are negative  Ambulatory Status: L BKA  Past Medical History:  Diagnosis Date  . Charcot ankle   . Hypertension   . Osteomyelitis of toe of right foot ([place])   . Recurrent boils   . Type 2 diabetes mellitus with diabetic neuropathy [facility])    Diagnosed age 77    Past Surgical History:  Procedure Laterality Date  . AMPUTATION Right 06/17/2014   Procedure: PARTIAL AMPUTATION RIGHT 3RD TOE;  Surgeon: Marcheta Grammes, DPM;  Location: [facility] ORS;  Service: Podiatry;  Laterality: Right;  . AMPUTATION Right 09/02/2014   Procedure: PARTIAL AMPUTATION 2ND TOE RIGHT FOOT;  Surgeon: Marcheta Grammes, DPM;  Location: [facility] OR[facility];  Service: Podiatry;  Laterality: Right;  . AMPUTATION Right 04/01/2015   Procedure: AMPUTATION DIGIT 1ST TOE RIGHT FOOT;  Surgeon: Juanita Laster, DPM;  Location: [facility];  Service: Podiatry;  Laterality: Right;  . AMPUTATION Right 06/29/2016   pinkeye   . AMPUTATION Right 10/24/2019   Procedure: PARTIAL THIRD RAY AMPUTATION;  Surgeon: [name], DPM;  Location: [facility];  Service:  Podiatry;  Laterality: Right;  . FOOT AMPUTATION Left   . FOOT SURGERY Left 03/2017  . HERNIA REPAIR Bilateral    and 3rd hernia repair does not remember ehat side  . TEE WITHOUT CARDIOVERSION N/A 10/29/2019   Procedure: TRANSESOPHAGEAL ECHOCARDIOGRAM (TEE) WITH PROPOFOL;  Surgeon: Fay Records, MD;  Location: [facility] ENDO SUITE;  Service: Cardiovascular;  Laterality: N/A;    Social History   Socioeconomic History  . Marital status: Single    Spouse name: Not on file  . Number of children: Not on file  . Years of education: Not on file  . Highest education level: Not on file  Occupational History  . Occupation: Receiving  Tobacco Use  . Smoking status: Never Smoker  . Smokeless tobacco: Never Used  Substance and Sexual Activity  . Alcohol use: No  . Drug use: No  . Sexual activity: Not on file  Other Topics Concern  . Not on file  Social History Narrative  . Not on file   Social Determinants of Health   Financial Resource Strain:   . Difficulty of Paying Living Expenses: Not on file  Food Insecurity:   . Worried About Charity fundraiser in the Last Year: Not on file  . Ran Out of Food in the Last Year: Not on file  Transportation Needs:   . Lack of Transportation (Medical): Not on file  . Lack of Transportation (Non-Medical): Not on file  Physical Activity:   . Days of Exercise  per Week: Not on file  . Minutes of Exercise per Session: Not on file  Stress:   . Feeling of Stress : Not on file  Social Connections:   . Frequency of Communication with Friends and Family: Not on file  . Frequency of Social Gatherings with Friends and Family: Not on file  . Attends Religious Services: Not on file  . Active Member of Clubs or Organizations: Not on file  . Attends Archivist Meetings: Not on file  . Marital Status: Not on file  Intimate Partner Violence:   . Fear of Current or Ex-Partner: Not on file  . Emotionally Abused: Not on file  . Physically Abused: Not on  file  . Sexually Abused: Not on file    No Known Allergies  Family History  Problem Relation Age of Onset  . Stroke Father   . Asthma Father   . COPD Father   . Aneurysm Father        AAA  . Diabetes Brother   . Stroke Brother        2008  . Diabetes Mother     Prior to Admission medications   Medication Sig Start Date End Date Taking? Authorizing Provider  albuterol (PROVENTIL HFA;VENTOLIN HFA) 108 (90 Base) MCG/ACT inhaler Inhale 2 puffs into the lungs every 6 (six) hours as needed for wheezing or shortness of breath. 01/08/19  Yes Dettinger, [name], MD  amiodarone (PACERONE) 200 MG tablet Take 1 tablet (200 mg total) by mouth 2 (two) times daily. 10/30/19  Yes Kathie Dike, MD  apixaban (ELIQUIS) 5 MG TABS tablet Take 61m po bid for 4 more days then 556mpo bid Patient taking differently: Take 10 mg by mouth 2 (two) times daily. Take 1040mo bid for 4 more days then 5mg19m bid starting on 10/28/2019 10/30/19  Yes MemoKathie Dike  aspirin (ASPIRIN EC) 81 MG EC tablet Take 81 mg by mouth daily. Swallow whole.   Yes [provider]  atorvastatin (LIPITOR) 20 MG tablet Take 1 tablet (20 mg total) by mouth daily. 07/22/19  Yes Dettinger, JoshFransisca Kaufmann  cyclobenzaprine (FLEXERIL) 10 MG tablet Take 1 tablet (10 mg total) by mouth at bedtime as needed for muscle spasms. 12/18/18  Yes Dettinger, JoshFransisca Kaufmann  diltiazem (CARDIZEM CD) 120 MG 24 hr capsule Take 1 capsule (120 mg total) by mouth daily. 10/31/19  Yes MemoKathie Dike  DULoxetine (CYMBALTA) 60 MG capsule Take 1 capsule (60 mg total) by mouth daily. 12/18/18  Yes Dettinger, JoshFransisca Kaufmann  fluticasone (FLONASE) 50 MCG/ACT nasal spray Place 1 spray into both nostrils 2 (two) times daily as needed for allergies or rhinitis. 01/08/19  Yes Dettinger, JoshFransisca Kaufmann  gabapentin (NEURONTIN) 600 MG tablet Take 1 tablet (600 mg total) by mouth at bedtime. 07/22/19  Yes Dettinger, JoshFransisca Kaufmann  HYDROcodone-acetaminophen (NORCO)  10-325 MG tablet Take 1 tablet by mouth 2 (two) times daily as needed for moderate pain. 07/28/19  Yes Dettinger, JoshFransisca Kaufmann  Insulin Glargine, 2 Unit Dial, (TOUJEO MAX SOLOSTAR) 300 UNIT/ML SOPN Inject 40-50 Units into the skin daily. 04/18/19  Yes Dettinger, JoshFransisca Kaufmann  lisinopril (PRINIVIL,ZESTRIL) 5 MG tablet Take 1 tablet (5 mg total) by mouth daily. 11/18/18  Yes Dettinger, JoshFransisca Kaufmann  metFORMIN (GLUCOPHAGE) 1000 MG tablet Take 1 tablet (1,000 mg total) by mouth 2 (two) times daily. 11/18/18  Yes Dettinger, JoshFransisca Kaufmann  metroNIDAZOLE (FLAGYL) 500 MG tablet  Take 1 tablet (500 mg total) by mouth 3 (three) times daily for 9 days. 10/30/19 11/08/19 Yes Kathie Dike, MD  Multiple Vitamins-Minerals (MULTIVITAMIN WITH MINERALS) tablet Take 1 tablet by mouth daily.   Yes [provider]  terbinafine (LAMISIL AT) 1 % cream Apply 1 application topically 2 (two) times daily. 07/17/19  Yes Dettinger, [name], MD  vancomycin (VANCOCIN) 500-5 MG/100ML-% IVPB Inject 500 mg into the vein every 12 (twelve) hours.   Yes [provider]  zinc gluconate 50 MG tablet Take 50 mg by mouth daily.   Yes [provider]  ceFAZolin (ANCEF) IVPB Inject 2 g into the vein every 8 (eight) hours for 10 days. Indication:  MSSA bacteremia  Last Day of Therapy:  11/08/2019 Labs - Once weekly:  CBC/D and BMP, Labs - Every other week:  ESR and CRP Patient not taking: Reported on 11/04/2019 10/30/19 11/09/19  Kathie Dike, MD    Physical Exam: Vitals:   11/04/19 1802  BP: 134/75  Pulse: 80  Resp: 20  Temp: 98.8 F (37.1 C)  TempSrc: Oral  SpO2: 94%  Weight: 90 kg  Height: 6' 4"  (1.93 m)     General:  Appears calm and comfortable Eyes:  PERRL, EOMI, normal lids, iris ENT:  grossly normal hearing, lips & tongue, mmm Neck:  no LAD, masses or thyromegaly Cardiovascular:  RRR, no m/r/g. No LE edema.  Respiratory:  CTA bilaterally, no w/r/r. Normal respiratory effort. Abdomen:  soft,  ntnd, NABS Skin: RLE patchy erythema and swelling below the knee w/ increasing uniformity approaching the ankle. Bulky dressin gin place over the R foot and ankle.  Musculoskeletal: L BKA, no bony abnormality Psychiatric:  grossly normal mood and affect, speech fluent and appropriate, AOx3 Neurologic:  CN 2-12 grossly intact, moves all extremities in coordinated fashion, sensation intact  Labs on Admission: I have personally reviewed following labs and imaging studies  CBC: Recent Labs  Lab 10/29/19 0600 10/30/19 0553  WBC 18.7* 17.2*  HGB 12.3* 11.6*  HCT 36.5* 35.2*  MCV 90.6 90.5  PLT 295 379   Basic Metabolic Panel: Recent Labs  Lab 10/29/19 0600  NA 129*  K 4.0  CL 91*  CO2 29  GLUCOSE 184*  BUN 16  CREATININE 0.81  CALCIUM 7.5*   GFR: Estimated Creatinine Clearance: 126.5 mL/min (by C-G formula based on SCr of 0.81 mg/dL). Liver Function Tests: No results for input(s): AST, ALT, ALKPHOS, BILITOT, PROT, ALBUMIN in the last 168 hours. No results for input(s): LIPASE, AMYLASE in the last 168 hours. No results for input(s): AMMONIA in the last 168 hours. Coagulation Profile: No results for input(s): INR, PROTIME in the last 168 hours. Cardiac Enzymes: No results for input(s): CKTOTAL, CKMB, CKMBINDEX, TROPONINI in the last 168 hours. BNP (last 3 results) No results for input(s): PROBNP in the last 8760 hours. HbA1C: No results for input(s): HGBA1C in the last 72 hours. CBG: Recent Labs  Lab 10/29/19 1612 10/29/19 2126 10/30/19 0824 10/30/19 1147 11/04/19 1808  GLUCAP 136* 173* 198* 204* 163*   Lipid Profile: No results for input(s): CHOL, HDL, LDLCALC, TRIG, CHOLHDL, LDLDIRECT in the last 72 hours. Thyroid Function Tests: No results for input(s): TSH, T4TOTAL, FREET4, T3FREE, THYROIDAB in the last 72 hours. Anemia Panel: No results for input(s): VITAMINB12, FOLATE, FERRITIN, TIBC, IRON, RETICCTPCT in the last 72 hours. Urine analysis:    Component  Value Date/Time   COLORURINE YELLOW 10/22/2019 1508   APPEARANCEUR HAZY (A) 10/22/2019 1508  LABSPEC 1.017 10/22/2019 1508   PHURINE 5.0 10/22/2019 1508   GLUCOSEU >=500 (A) 10/22/2019 1508   HGBUR SMALL (A) 10/22/2019 1508   BILIRUBINUR NEGATIVE 10/22/2019 1508   BILIRUBINUR negative 03/26/2014 1212   KETONESUR 5 (A) 10/22/2019 1508   PROTEINUR NEGATIVE 10/22/2019 1508   UROBILINOGEN 0.2 06/13/2014 1048   NITRITE NEGATIVE 10/22/2019 1508   LEUKOCYTESUR NEGATIVE 10/22/2019 1508    Creatinine Clearance: Estimated Creatinine Clearance: 126.5 mL/min (by C-G formula based on SCr of 0.81 mg/dL).  Sepsis Labs: @LABRCNTIP (procalcitonin:4,lacticidven:4) ) Recent Results (from the past 240 hour(s))  Culture, blood (routine x 2)     Status: None   Collection Time: 10/26/19 11:17 AM   Specimen: BLOOD RIGHT ARM  Result Value Ref Range Status   Specimen Description BLOOD RIGHT ARM  Final   Special Requests   Final    BOTTLES DRAWN AEROBIC AND ANAEROBIC Blood Culture adequate volume   Culture   Final    NO GROWTH 5 DAYS Performed at [facility], [address], [place], [place] [address]    Report Status 10/31/2019 FINAL  Final  Culture, blood (routine x 2)     Status: None   Collection Time: 10/26/19 11:17 AM   Specimen: BLOOD LEFT HAND  Result Value Ref Range Status   Specimen Description BLOOD LEFT HAND  Final   Special Requests   Final    BOTTLES DRAWN AEROBIC ONLY Blood Culture adequate volume   Culture   Final    NO GROWTH 5 DAYS Performed at [facility], [address], [place], [place] [address]    Report Status 10/31/2019 FINAL  Final     Radiological Exams on Admission: No results found.  EKG: Pending  Assessment/Plan Active Problems:   Osteomyelitis of toe of right foot ([facility])   Type 2 diabetes mellitus ([facility])   Cellulitis and abscess of foot   S/P BKA (below knee amputation) unilateral, left ([facility])   A-fib ([facility])   Acute deep vein thrombosis (DVT) of right  popliteal vein ([facility])   Cellulitis   Cellulitis and Abscess: Continued from previous admission w/ worsening per report by outpt Podiatry team. Sent for direct admission and OR I&D and washout. RUE PICC in place. ON Ancef and Flagyl since time of DC on 10/30/19.  - DC Ancef and Flagyl - Start Vanc and Zosyn - surgical mgt per Podiatry - BCX at time of admission given recent bacteremia  DVT: R popliteal. On Eliquis - Hold for surgery in am - resume Eliquis postop  DM: poorly controlled w/ neuropathy - SSI - Hold metformin - Continue Neurontin for peripheral neuropathy  PVD: secondary to poorly controlled DM and HLD.  - continue ASA  Afib: - EKG - continue AMio and Dilt  Chronic pain - Continue home NOrco and flexeril - IV Morphine when NPO  HLD:  - continue Lipitor  Depression: - continue cymbalta    DVT prophylaxis: hold due to surgery in am. On eliquis at time of admission Code Status: Full  Family Communication: none  Disposition Plan: pending surgery   Consults called: Podiatry  Admission status: Inpt    Waldemar Dickens MD [facility]  If 7PM-7AM, please contact night-coverage [email or web address] Password [email or web address]  11/04/2019, 8:10 PM

## 2019-11-04 NOTE — Consult Note (Signed)
Podiatry Consult Note  Reason for Consultation:  Surgical management of R ankle abscess  History of Present Illness: [name] is a 53 y.o. male with history of poorly controlled DM, peripheral neuropathy, Charcot arthropathy, numerous diabetic foot ulcers, BKA of LLE and recent admission for sepsis/MSSA bacteremia from osteomyelitis of R third toe who presented to my private office this afternoon for a scheduled follow-up visit.  Significant erythema and edema of the R ankle was noted with large areas of fluctuance.  Approximately 25 cc of purulence was evacuated from the lateral R ankle.  Hospitalist service was contacted and admission arranged.  Incision and drainage of R ankle is planned for tomorrow.  He was D/C on 10/30/2019 on cefazolin and metronidazole per ID recommendations.  Cefazolin was D/C on 11/01/2019 after patient developed rash/itching over his trunk and upper extremities.  He was placed on vancomycin.  I communicated this with Dr. Orvan Falconer this AM.   Past Medical History:  Diagnosis Date  . Charcot ankle   . Hypertension   . Osteomyelitis of toe of right foot ([facility])   . Recurrent boils   . Type 2 diabetes mellitus with diabetic neuropathy [facility])    Diagnosed age 75   Scheduled Meds: . amiodarone  200 mg Oral BID  . aspirin EC  81 mg Oral Daily  . atorvastatin  20 mg Oral Daily  . diltiazem  120 mg Oral Daily  . DULoxetine  60 mg Oral Daily  . gabapentin  600 mg Oral QHS  . insulin aspart  0-5 Units Subcutaneous QHS  . [START ON 11/05/2019] insulin aspart  0-9 Units Subcutaneous TID WC   Continuous Infusions: . sodium chloride    . vancomycin    . vancomycin    . [START ON 11/05/2019] vancomycin     PRN Meds:.acetaminophen **OR** acetaminophen, albuterol, cyclobenzaprine, diphenhydrAMINE, hydrALAZINE, HYDROcodone-acetaminophen, morphine injection, ondansetron **OR** ondansetron (ZOFRAN) IV  No Known Allergies Past Surgical History:  Procedure Laterality Date   . AMPUTATION Right 06/17/2014   Procedure: PARTIAL AMPUTATION RIGHT 3RD TOE;  Surgeon: [name], DPM;  Location[facility];  Service: Podiatry;  Laterality: Right;  . AMPUTATION Right 09/02/2014   Procedure: PARTIAL AMPUTATION 2ND TOE RIGHT FOOT;  Surgeon: [name], DPM;  Location: [facility];  Service: Podiatry;  Laterality: Right;  . AMPUTATION Right 04/01/2015   Procedure: AMPUTATION DIGIT 1ST TOE RIGHT FOOT;  Surgeon: Laurell Josephs, DPM;  Location: [facility];  Service: Podiatry;  Laterality: Right;  . AMPUTATION Right 06/29/2016   pinkeye   . AMPUTATION Right 10/24/2019   Procedure: PARTIAL THIRD RAY AMPUTATION;  Surgeon: Ferman Hamming, DPM;  Location: [facility];  Service: Podiatry;  Laterality: Right;  . FOOT AMPUTATION Left   . FOOT SURGERY Left 03/2017  . HERNIA REPAIR Bilateral    and 3rd hernia repair does not remember ehat side  . TEE WITHOUT CARDIOVERSION N/A 10/29/2019   Procedure: TRANSESOPHAGEAL ECHOCARDIOGRAM (TEE) WITH PROPOFOL;  Surgeon: Pricilla Riffle, MD;  Location[facility];  Service: Cardiovascular;  Laterality: N/A;   Family History  Problem Relation Age of Onset  . Stroke Father   . Asthma Father   . COPD Father   . Aneurysm Father        AAA  . Diabetes Brother   . Stroke Brother        2008  . Diabetes Mother    Social History:  reports that he has never smoked. He has never used smokeless  tobacco. He reports that he does not drink alcohol or use drugs.  Review of Systems: Denies nausea, vomiting, fever or chills.  Complains of resolving rash with itching.  Physical Examination: Vital signs in last 24 hours:   Temp:  [98.8 F (37.1 C)] 98.8 F (37.1 C) (12/29 1802) Pulse Rate:  [80] 80 (12/29 1802) Resp:  [20] 20 (12/29 1802) BP: (134)/(75) 134/75 (12/29 1802) SpO2:  [94 %] 94 % (12/29 1802) Weight:  [90 kg] 90 kg (12/29 1802)  GENERAL:  Alert, NAD. EQUIPMENT:  Prosthesis on LLE.  Walker. INTEGUMENT:  S/P recent amputation  of R 3rd toe.  Amputation site open and packed with packing gauze. No erythema from forefoot to midfoot. Skin surrounding R ankle is red and warm.  The medial and lateral aspects of the ankle are fluctuant. VASCULAR:  R DP pulse is palpable.  R PT pulse is obscurred by edema.  Edema of RLE most significant surrounding ankle.    Lab/Test Results:  No results for input(s): WBC, HGB, HCT, PLT, NA, K, CL, CO2, BUN, CREATININE, GLUCOSE, CALCIUM in the last 72 hours.  Recent Results (from the past 240 hour(s))  Culture, blood (routine x 2)     Status: None   Collection Time: 10/26/19 11:17 AM   Specimen: BLOOD RIGHT ARM  Result Value Ref Range Status   Specimen Description BLOOD RIGHT ARM  Final   Special Requests   Final    BOTTLES DRAWN AEROBIC AND ANAEROBIC Blood Culture adequate volume   Culture   Final    NO GROWTH 5 DAYS Performed at [facility], [address] [place], [place] [address]    Report Status 10/31/2019 FINAL  Final  Culture, blood (routine x 2)     Status: None   Collection Time: 10/26/19 11:17 AM   Specimen: BLOOD LEFT HAND  Result Value Ref Range Status   Specimen Description BLOOD LEFT HAND  Final   Special Requests   Final    BOTTLES DRAWN AEROBIC ONLY Blood Culture adequate volume   Culture   Final    NO GROWTH 5 DAYS Performed at [facility], [address] [place], [place] [address]    Report Status 10/31/2019 FINAL  Final     No results found.  Assessment/Plan: 1.  Cellulitis with abscess, R ankle  Vanc/Zosyn per IM   Obtain MRI  Explained to patient that he is at risk for more proximal amputation.  I&D planned for tomorrow.  Benefits and risks of procedure have been discussed. No guarantees given.  Consent to transcribed and obtained.    NPO after midnight  Hold Eliquis in preparation for planned surgery  May resume Eliquis following surgery  2.  S/P partial 3rd ray amputation, R foot  Packed amputation site.  Will change packing QD.  3.  DVT,  RLE  Management per IM  Brita Romp 11/04/2019, 8:22 PM

## 2019-11-05 ENCOUNTER — Inpatient Hospital Stay (HOSPITAL_COMMUNITY): Payer: Medicare Other | Admitting: Anesthesiology

## 2019-11-05 ENCOUNTER — Encounter (HOSPITAL_COMMUNITY)
Admission: AD | Disposition: A | Discharge status: Home Health | Payer: Self-pay | Source: Ambulatory Visit | Attending: Internal Medicine

## 2019-11-05 ENCOUNTER — Inpatient Hospital Stay (HOSPITAL_COMMUNITY): Payer: Medicare Other

## 2019-11-05 ENCOUNTER — Encounter (HOSPITAL_COMMUNITY): Payer: Self-pay | Admitting: Family Medicine

## 2019-11-05 ENCOUNTER — Other Ambulatory Visit: Payer: Self-pay

## 2019-11-05 DIAGNOSIS — L02419 Cutaneous abscess of limb, unspecified: Secondary | ICD-10-CM

## 2019-11-05 HISTORY — PX: IRRIGATION AND DEBRIDEMENT ABSCESS: SHX5252

## 2019-11-05 LAB — BASIC METABOLIC PANEL
Anion gap: 9 (ref 5–15)
BUN: 7 mg/dL (ref 6–20)
CO2: 28 mmol/L (ref 22–32)
Calcium: 7.8 mg/dL — ABNORMAL LOW (ref 8.9–10.3)
Chloride: 95 mmol/L — ABNORMAL LOW (ref 98–111)
Creatinine, Ser: 0.65 mg/dL (ref 0.61–1.24)
GFR calc Af Amer: >60 mL/min (ref >60)
GFR calc non Af Amer: >60 mL/min (ref >60)
Glucose, Bld: 240 mg/dL — ABNORMAL HIGH (ref 70–99)
Potassium: 3.7 mmol/L (ref 3.5–5.1)
Sodium: 132 mmol/L — ABNORMAL LOW (ref 135–145)

## 2019-11-05 LAB — GLUCOSE, CAPILLARY
Glucose-Capillary: 240 mg/dL — ABNORMAL HIGH (ref 70–99)
Glucose-Capillary: 216 mg/dL — ABNORMAL HIGH (ref 70–99)
Glucose-Capillary: 223 mg/dL — ABNORMAL HIGH (ref 70–99)
Glucose-Capillary: 237 mg/dL — ABNORMAL HIGH (ref 70–99)
Glucose-Capillary: 250 mg/dL — ABNORMAL HIGH (ref 70–99)

## 2019-11-05 LAB — RESPIRATORY PANEL BY RT PCR (FLU A&B, COVID)
Influenza A by PCR: NEGATIVE
Influenza B by PCR: NEGATIVE
SARS Coronavirus 2 by RT PCR: NEGATIVE

## 2019-11-05 SURGERY — IRRIGATION AND DEBRIDEMENT ABSCESS
Anesthesia: Monitor Anesthesia Care | Site: Ankle | Laterality: Right

## 2019-11-05 MED ORDER — DIPHENHYDRAMINE HCL 50 MG/ML IJ SOLN
INTRAMUSCULAR | Status: DC | PRN
  Administered 2019-11-05: 25 mg via INTRAVENOUS

## 2019-11-05 MED ORDER — ROCURONIUM BROMIDE 10 MG/ML (PF) SYRINGE
PREFILLED_SYRINGE | INTRAVENOUS | Status: AC
  Filled 2019-11-05: qty 10

## 2019-11-05 MED ORDER — BUPIVACAINE HCL (PF) 0.5 % IJ SOLN
INTRAMUSCULAR | Status: AC
  Filled 2019-11-05: qty 30

## 2019-11-05 MED ORDER — LACTATED RINGERS IV SOLN: INTRAVENOUS | Status: DC | PRN

## 2019-11-05 MED ORDER — GLYCOPYRROLATE PF 0.2 MG/ML IJ SOSY
PREFILLED_SYRINGE | INTRAMUSCULAR | Status: AC
  Filled 2019-11-05: qty 2

## 2019-11-05 MED ORDER — SODIUM CHLORIDE (PF) 0.9 % IJ SOLN
INTRAMUSCULAR | Status: AC
  Filled 2019-11-05: qty 10

## 2019-11-05 MED ORDER — VANCOMYCIN HCL 1000 MG IV SOLR
INTRAVENOUS | Status: DC | PRN
  Administered 2019-11-05: 1000 mg via INTRAVENOUS

## 2019-11-05 MED ORDER — MIDAZOLAM HCL 2 MG/2ML IJ SOLN
INTRAMUSCULAR | Status: AC
  Filled 2019-11-05: qty 2

## 2019-11-05 MED ORDER — LIDOCAINE 2% (20 MG/ML) 5 ML SYRINGE
INTRAMUSCULAR | Status: AC
  Filled 2019-11-05: qty 15

## 2019-11-05 MED ORDER — LIDOCAINE HCL (PF) 1 % IJ SOLN
INTRAMUSCULAR | Status: AC
  Filled 2019-11-05: qty 30

## 2019-11-05 MED ORDER — LACTATED RINGERS IV SOLN: Freq: Once | INTRAVENOUS | Status: AC

## 2019-11-05 MED ORDER — ONDANSETRON HCL 4 MG/2ML IJ SOLN
INTRAMUSCULAR | Status: AC
  Filled 2019-11-05: qty 2

## 2019-11-05 MED ORDER — PROPOFOL 500 MG/50ML IV EMUL
INTRAVENOUS | Status: DC | PRN
  Administered 2019-11-05: 75 ug/kg/min via INTRAVENOUS

## 2019-11-05 MED ORDER — KETAMINE HCL 50 MG/5ML IJ SOSY
PREFILLED_SYRINGE | INTRAMUSCULAR | Status: AC
  Filled 2019-11-05: qty 5

## 2019-11-05 MED ORDER — SUCCINYLCHOLINE CHLORIDE 200 MG/10ML IV SOSY
PREFILLED_SYRINGE | INTRAVENOUS | Status: AC
  Filled 2019-11-05: qty 10

## 2019-11-05 MED ORDER — 0.9 % SODIUM CHLORIDE (POUR BTL) OPTIME
TOPICAL | Status: DC | PRN
  Administered 2019-11-05: 1000 mL

## 2019-11-05 MED ORDER — FENTANYL CITRATE (PF) 100 MCG/2ML IJ SOLN
INTRAMUSCULAR | Status: DC | PRN
  Administered 2019-11-05: 50 ug via INTRAVENOUS

## 2019-11-05 MED ORDER — APIXABAN 5 MG PO TABS
5.0000 mg | ORAL_TABLET | Freq: Two times a day (BID) | ORAL | Status: DC
  Administered 2019-11-06: 5 mg via ORAL
  Filled 2019-11-05: qty 1

## 2019-11-05 MED ORDER — GADOBUTROL 1 MMOL/ML IV SOLN
9.0000 mL | Freq: Once | INTRAVENOUS | Status: AC | PRN
  Administered 2019-11-05: 9 mL via INTRAVENOUS

## 2019-11-05 MED ORDER — GADOBUTROL 1 MMOL/ML IV SOLN: 9.0000 mL | Freq: Once | INTRAVENOUS | Status: DC | PRN

## 2019-11-05 MED ORDER — MIDAZOLAM HCL 5 MG/5ML IJ SOLN
INTRAMUSCULAR | Status: DC | PRN
  Administered 2019-11-05: 2 mg via INTRAVENOUS

## 2019-11-05 MED ORDER — INSULIN GLARGINE 100 UNIT/ML ~~LOC~~ SOLN
20.0000 [IU] | Freq: Every day | SUBCUTANEOUS | Status: DC
  Administered 2019-11-05 – 2019-11-07 (×3): 20 [IU] via SUBCUTANEOUS
  Filled 2019-11-05 (×5): qty 0.2

## 2019-11-05 MED ORDER — PROPOFOL 10 MG/ML IV BOLUS
INTRAVENOUS | Status: AC
  Filled 2019-11-05: qty 20

## 2019-11-05 MED ORDER — PHENYLEPHRINE 40 MCG/ML (10ML) SYRINGE FOR IV PUSH (FOR BLOOD PRESSURE SUPPORT)
PREFILLED_SYRINGE | INTRAVENOUS | Status: AC
  Filled 2019-11-05: qty 20

## 2019-11-05 MED ORDER — HEMOSTATIC AGENTS (NO CHARGE) OPTIME
TOPICAL | Status: DC | PRN
  Administered 2019-11-05: 1 via TOPICAL

## 2019-11-05 MED ORDER — BUPIVACAINE HCL (PF) 0.5 % IJ SOLN
INTRAMUSCULAR | Status: DC | PRN
  Administered 2019-11-05: 20 mL

## 2019-11-05 MED ORDER — DIPHENHYDRAMINE HCL 50 MG/ML IJ SOLN
INTRAMUSCULAR | Status: AC
  Filled 2019-11-05: qty 1

## 2019-11-05 MED ORDER — ONDANSETRON HCL 4 MG/2ML IJ SOLN: 4.0000 mg | Freq: Once | INTRAMUSCULAR | Status: DC | PRN

## 2019-11-05 MED ORDER — FENTANYL CITRATE (PF) 100 MCG/2ML IJ SOLN
INTRAMUSCULAR | Status: AC
  Filled 2019-11-05: qty 2

## 2019-11-05 MED ORDER — PROPOFOL 10 MG/ML IV BOLUS
INTRAVENOUS | Status: DC | PRN
  Administered 2019-11-05 (×2): 20 mg via INTRAVENOUS

## 2019-11-05 MED ORDER — KETAMINE HCL 10 MG/ML IJ SOLN
INTRAMUSCULAR | Status: DC | PRN
  Administered 2019-11-05 (×2): 10 mg via INTRAVENOUS

## 2019-11-05 MED ORDER — MEPERIDINE HCL 50 MG/ML IJ SOLN: 6.2500 mg | INTRAMUSCULAR | Status: DC | PRN

## 2019-11-05 MED ORDER — EPHEDRINE 5 MG/ML INJ
INTRAVENOUS | Status: AC
  Filled 2019-11-05: qty 20

## 2019-11-05 MED ORDER — "THROMBI-PAD 3""X3"" EX PADS"
2.0000 | MEDICATED_PAD | Freq: Once | CUTANEOUS | Status: DC
  Filled 2019-11-05: qty 2

## 2019-11-05 MED ORDER — SODIUM CHLORIDE 0.9 % IR SOLN
Status: DC | PRN
  Administered 2019-11-05: 3000 mL

## 2019-11-05 MED ORDER — HYDROMORPHONE HCL 1 MG/ML IJ SOLN: 0.2500 mg | INTRAMUSCULAR | Status: DC | PRN

## 2019-11-05 SURGICAL SUPPLY — 46 items
BANDAGE ELASTIC 4 VELCRO NS (GAUZE/BANDAGES/DRESSINGS) ×4 IMPLANT
BANDAGE ESMARK 4X12 BL STRL LF (DISPOSABLE) ×1 IMPLANT
BLADE SURG 15 STRL LF DISP TIS (BLADE) ×2 IMPLANT
BLADE SURG 15 STRL SS (BLADE) ×2
BNDG CONFORM 6X.82 1P STRL (GAUZE/BANDAGES/DRESSINGS) ×4 IMPLANT
BNDG ELASTIC 4X5.8 VLCR NS LF (GAUZE/BANDAGES/DRESSINGS) ×2 IMPLANT
BNDG ESMARK 4X12 BLUE STRL LF (DISPOSABLE) ×2
BNDG GAUZE ELAST 4 BULKY (GAUZE/BANDAGES/DRESSINGS) ×2 IMPLANT
CLOTH BEACON ORANGE TIMEOUT ST (SAFETY) ×2 IMPLANT
CNTNR SPEC C3OZ STD GRAD LEK (MISCELLANEOUS) ×3 IMPLANT
CONT SPEC 3OZ W/LID STRL (MISCELLANEOUS) ×3
COVER LIGHT HANDLE STERIS (MISCELLANEOUS) ×4 IMPLANT
COVER WAND RF STERILE (DRAPES) ×2 IMPLANT
CUFF TOURN SGL QUICK 34 (TOURNIQUET CUFF) ×1
CUFF TRNQT CYL 34X4.125X (TOURNIQUET CUFF) ×1 IMPLANT
DECANTER SPIKE VIAL GLASS SM (MISCELLANEOUS) ×2 IMPLANT
DRSG ADAPTIC 3X8 NADH LF (GAUZE/BANDAGES/DRESSINGS) ×2 IMPLANT
ELECT REM PT RETURN 9FT ADLT (ELECTROSURGICAL) ×2
ELECTRODE REM PT RTRN 9FT ADLT (ELECTROSURGICAL) ×1 IMPLANT
GAUZE PACKING IODOFORM 1/2 (PACKING) ×2 IMPLANT
GAUZE SPONGE 4X4 12PLY STRL (GAUZE/BANDAGES/DRESSINGS) ×2 IMPLANT
GAUZE XEROFORM 5X9 LF (GAUZE/BANDAGES/DRESSINGS) ×2 IMPLANT
GLOVE BIO SURGEON STRL SZ7 (GLOVE) ×2 IMPLANT
GLOVE BIO SURGEON STRL SZ7.5 (GLOVE) ×2 IMPLANT
GLOVE BIOGEL PI IND STRL 7.0 (GLOVE) ×2 IMPLANT
GLOVE BIOGEL PI INDICATOR 7.0 (GLOVE) ×2
GOWN STRL REUS W/TWL LRG LVL3 (GOWN DISPOSABLE) ×4 IMPLANT
HANDPIECE INTERPULSE COAX TIP (DISPOSABLE) ×1
IV NS IRRIG 3000ML ARTHROMATIC (IV SOLUTION) ×2 IMPLANT
KIT TURNOVER KIT A (KITS) ×2 IMPLANT
MANIFOLD NEPTUNE II (INSTRUMENTS) ×2 IMPLANT
MARKER SKIN DUAL TIP RULER LAB (MISCELLANEOUS) ×2 IMPLANT
NEEDLE BIOPSY T-LOK 11X4 (NEEDLE) ×2 IMPLANT
NEEDLE HYPO 27GX1-1/4 (NEEDLE) ×8 IMPLANT
NS IRRIG 1000ML POUR BTL (IV SOLUTION) ×2 IMPLANT
PACK BASIC LIMB (CUSTOM PROCEDURE TRAY) ×2 IMPLANT
PAD ABD 5X9 TENDERSORB (GAUZE/BANDAGES/DRESSINGS) ×8 IMPLANT
PAD ARMBOARD 7.5X6 YLW CONV (MISCELLANEOUS) ×2 IMPLANT
SET BASIN LINEN APH (SET/KITS/TRAYS/PACK) ×2 IMPLANT
SET HNDPC FAN SPRY TIP SCT (DISPOSABLE) ×1 IMPLANT
SUT BONE WAX W31G (SUTURE) ×2 IMPLANT
SUT MON AB 5-0 PS2 18 (SUTURE) ×2 IMPLANT
SUT VIC AB 4-0 PS2 27 (SUTURE) ×2 IMPLANT
SUT VICRYL AB 3-0 FS1 BRD 27IN (SUTURE) ×2 IMPLANT
SYR CONTROL 10ML LL (SYRINGE) ×4 IMPLANT
TUBE ANAEROBIC PORT A CUL  W/M (MISCELLANEOUS) ×2 IMPLANT

## 2019-11-05 NOTE — Brief Op Note (Addendum)
BRIEF OPERATIVE NOTE  DATE OF PROCEDURE 11/05/2019  SURGEON Marcheta Grammes, DPM  ASSISTANT SURGEON Jilda Panda, DPM  OR STAFF Circulator: Cox, Gershon Mussel, RN Scrub Person: Lucie Leather, CST Float Surgical Tech: Karin Lieu, CST   PREOPERATIVE DIAGNOSIS 1.  Abscess, right ankle 2.  Charcoal arthropathy, right ankle 3.  Diabetic neuropathy  POSTOPERATIVE DIAGNOSIS Same  PROCEDURE 1.  Incision and drainage of abscess, right ankle  ANESTHESIA Monitor Anesthesia Care   HEMOSTASIS Pneumatic thigh tourniquet applied but never inflated  ESTIMATED BLOOD LOSS 100 cc  MATERIALS USED Iodoform packing gauze Topical thrombin patch  INJECTABLES 0.5% Marcaine plain  PATHOLOGY 1.  Soft tissue to microbiology for culture and sensitivity 2.  Bone from right fibula to microbiology for culture and sensitivity 3.  Bone biopsy from right fibula to pathology  COMPLICATIONS None

## 2019-11-05 NOTE — Progress Notes (Signed)
PROGRESS NOTE    [name]  WJX:[id number] DOB: Jan 03, 1966 DOA: 11/04/2019 PCP: Dettinger, Elige RadonJoshua A, MD    Brief Narrative:  53 year old male with history of hypertension, atrial fibrillation, recent DVT, diabetes, left BKA, most recently to hospital for partial ray amputation.  At that time, he was found to have sepsis with bacteremia and was discharged home on intravenous antibiotics.  On follow-up with his podiatrist, he was noted to have significant cellulitis and abscess of the right ankle.  He was admitted for further intervention.   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 Cellulitis and abscess of right ankle.  Status post incision and drainage.  Follow-up cultures.  Currently on IV antibiotics. 2. DVT.  Recent right popliteal DVT.  Continue Eliquis. 3. Diabetes.  Holding Metformin.  On sliding scale insulin.  Blood sugars are running high.  Start on Lantus. 4. Atrial fibrillation.  Continue amiodarone and diltiazem.  He is anticoagulated with Eliquis. 5. Peripheral vascular disease.  Secondary to poorly controlled diabetes and hyperlipidemia.  Continue aspirin. 6. Chronic pain.  Continue Norco and Flexeril. 7. Hyperlipidemia.  Continue Lipitor 8. Depression.  Continue Cymbalta.   DVT prophylaxis: Eliquis Code Status: Full code Family Communication: None present Disposition Plan: Discharge home culture results are available to determine antibiotic course.   Consultants:   Podiatry  Procedures:  Incision and drainage of abscess, right ankle  Antimicrobials:   Vancomycin 12/30 >   Subjective: No pain.  No shortness of breath.  No nausea or vomiting.  Objective: Vitals:   11/05/19 0424 11/05/19  1106 11/05/19 1315 11/05/19 1330  BP: 125/72 125/77 (!) 95/51 120/74  Pulse: 77 80 74 77  Resp: 20 18 16 15   Temp: 98.6 F (37 C) 98.5 F (36.9 C) 98.1 F (36.7 C)   TempSrc: Oral Oral    SpO2: 99% 98% 94% 100%  Weight:      Height:        Intake/Output Summary (Last 24 hours) at 11/05/2019 2015 Last data filed at 11/05/2019 1317 Gross per 24 hour  Intake 1275.89 ml  Output 2000 ml  Net -724.11 ml   Filed Weights   11/04/19 1802  Weight: 90 kg    Examination:  General exam: Appears calm and comfortable  Respiratory system: Clear to auscultation. Respiratory effort normal. Cardiovascular system: S1 & S2 heard, RRR. No JVD, murmurs, rubs, gallops or clicks. No pedal edema. Gastrointestinal system: Abdomen is nondistended, soft and nontender. No organomegaly or masses felt. Normal bowel sounds heard. Central nervous system: Alert and oriented. No focal neurological deficits. Extremities: Left BKA Skin: Right foot is wrapped in dressings. Psychiatry: Judgement and insight appear normal. Mood & affect appropriate.     Data Reviewed: I have personally reviewed following labs and imaging studies  CBC: Recent Labs  Lab 10/30/19 0553 11/04/19 2226  WBC 17.2* 10.8*  NEUTROABS  --  9.1*  HGB 11.6* 10.0*  HCT 35.2* 31.4*  MCV 90.5 93.7  PLT 299 428*   Basic Metabolic Panel: Recent Labs  Lab 11/04/19 2226 11/05/19 0649  NA 126* 132*  K 3.6 3.7  CL 90* 95*  CO2 27 28  GLUCOSE 284* 240*  BUN 8 7  CREATININE 0.73 0.65  CALCIUM 7.8* 7.8*  MG 1.4*  --   PHOS 2.8  --    GFR: Estimated Creatinine Clearance: 128.1 mL/min (by C-G formula based on SCr of 0.65 mg/dL). Liver Function Tests: Recent Labs  Lab 11/04/19 2226  AST 18  ALT 17  ALKPHOS 106  BILITOT 0.9  PROT 6.4*  ALBUMIN 1.8*   No results for input(s): LIPASE, AMYLASE in the last 168 hours. No results for input(s): AMMONIA in the last 168 hours. Coagulation Profile: No results for input(s): INR,  PROTIME in the last 168 hours. Cardiac Enzymes: No results for input(s): CKTOTAL, CKMB, CKMBINDEX, TROPONINI in the last 168 hours. BNP (last 3 results) No results for input(s): PROBNP in the last 8760 hours. HbA1C: No results for input(s): HGBA1C in the last 72 hours. CBG: Recent Labs  Lab 11/04/19 1808 11/04/19 2146 11/05/19 0936 11/05/19 1113 11/05/19 1627  GLUCAP 163* 240* 216* 223* 237*   Lipid Profile: No results for input(s): CHOL, HDL, LDLCALC, TRIG, CHOLHDL, LDLDIRECT in the last 72 hours. Thyroid Function Tests: No results for input(s): TSH, T4TOTAL, FREET4, T3FREE, THYROIDAB in the last 72 hours. Anemia Panel: No results for input(s): VITAMINB12, FOLATE, FERRITIN, TIBC, IRON, RETICCTPCT in the last 72 hours. Sepsis Labs: No results for input(s): PROCALCITON, LATICACIDVEN in the last 168 hours.  Recent Results (from the past 240 hour(s))  Culture, blood (routine x 2)     Status: None (Preliminary result)   Collection Time: 11/04/19 10:21 PM   Specimen: BLOOD RIGHT ARM  Result Value Ref Range Status   Specimen Description BLOOD RIGHT ARM  Final   Special Requests   Final    BOTTLES DRAWN AEROBIC AND ANAEROBIC Blood Culture adequate volume   Culture   Final    NO GROWTH < 24 HOURS Performed at [facility], [address] [place], [place] [address]    Report Status PENDING  Incomplete  Culture, blood (routine x 2)     Status: None (Preliminary result)   Collection Time: 11/04/19 10:26 PM   Specimen: BLOOD RIGHT HAND  Result Value Ref Range Status   Specimen Description BLOOD RIGHT HAND  Final   Special Requests   Final    BOTTLES DRAWN AEROBIC AND ANAEROBIC Blood Culture adequate volume   Culture   Final    NO GROWTH < 24 HOURS Performed at [facility] [address] [place], [place] [address]    Report Status PENDING  Incomplete  Respiratory Panel by RT PCR (Flu A&B, Covid) - Nasopharyngeal Swab     Status: None   Collection Time: 11/05/19  9:20 AM    Specimen: Nasopharyngeal Swab  Result Value Ref Range Status   SARS Coronavirus 2 by RT PCR NEGATIVE NEGATIVE Final    Comment: (NOTE) SARS-CoV-2 target nucleic acids are NOT DETECTED. The SARS-CoV-2 RNA is generally detectable in upper respiratoy specimens during the acute phase of infection. The lowest concentration of SARS-CoV-2 viral copies this assay can detect is 131 copies/mL. A negative result does not preclude SARS-Cov-2 infection and should not be used as the sole basis for treatment or other patient management decisions. A negative result may occur with  improper specimen collection/handling, submission of specimen other than nasopharyngeal swab, presence of viral mutation(s) within the areas targeted by this assay, and inadequate number of viral copies (<131 copies/mL). A negative result must be combined with clinical observations, patient history, and epidemiological information. The expected result is Negative. Fact Sheet  for Patients:  PinkCheek.be Fact Sheet for Healthcare Providers:  GravelBags.it This test is not yet ap proved or cleared by the [place] FDA and  has been authorized for detection and/or diagnosis of SARS-CoV-2 by FDA under an Emergency Use Authorization (EUA). This EUA will remain  in effect (meaning this test can be used) for the duration of the COVID-19 declaration under Section 564(b)(1) of the Act, 21 U.S.C. section 360bbb-3(b)(1), unless the authorization is terminated or revoked sooner.    Influenza A by PCR NEGATIVE NEGATIVE Final   Influenza B by PCR NEGATIVE NEGATIVE Final    Comment: (NOTE) The Xpert Xpress SARS-CoV-2/FLU/RSV assay is intended as an aid in  the diagnosis of influenza from Nasopharyngeal swab specimens and  should not be used as a sole basis for treatment. Nasal washings and  aspirates are unacceptable for Xpert Xpress SARS-CoV-2/FLU/RSV  testing. Fact Sheet  for Patients: PinkCheek.be Fact Sheet for Healthcare Providers: GravelBags.it This test is not yet approved or cleared by the [place] FDA and  has been authorized for detection and/or diagnosis of SARS-CoV-2 by  FDA under an Emergency Use Authorization (EUA). This EUA will remain  in effect (meaning this test can be used) for the duration of the  Covid-19 declaration under Section 564(b)(1) of the Act, 21  U.S.C. section 360bbb-3(b)(1), unless the authorization is  terminated or revoked. Performed at [facility], [address] [place], [place] [address]   Aerobic/Anaerobic Culture (surgical/deep wound)     Status: None (Preliminary result)   Collection Time: 11/05/19  1:34 PM   Specimen: Soft Tissue, Other  Result Value Ref Range Status   Specimen Description   Final    BONE RIGHT FIBULA BX Performed at [facility], [address], [place], [place] [address]    Special Requests   Final    VANCOMYCIN Performed at [facility], [address] [place], [place] [address]    Gram Stain   Final    MODERATE WBC PRESENT,BOTH PMN AND MONONUCLEAR FEW GRAM POSITIVE COCCI Performed at [facility], [place] [address], [place], [place] [address]    Culture PENDING  Incomplete   Report Status PENDING  Incomplete  Aerobic/Anaerobic Culture (surgical/deep wound)     Status: None (Preliminary result)   Collection Time: 11/05/19  1:37 PM   Specimen: Soft Tissue, Other  Result Value Ref Range Status   Specimen Description   Final    TISSUE RIGHT ANKLE Performed at [facility], [address], [place], [place] [address]    Special Requests   Final    VANCOMYCIN Performed at [facility], [address], [place], [place] [address]    Gram Stain   Final    FEW WBC PRESENT,BOTH PMN AND MONONUCLEAR ABUNDANT GRAM POSITIVE COCCI Performed at [facility], [place] [address], [place], [place] [address]    Culture PENDING   Incomplete   Report Status PENDING  Incomplete         Radiology Studies: DG Ankle Complete Right  Result Date: 11/05/2019 CLINICAL DATA:  Recent ankle surgery with increased swelling and redness EXAM: RIGHT ANKLE - COMPLETE 3+ VIEW COMPARISON:  10/22/2019, 06/19/2016 FINDINGS: Increased soft tissue swelling compared to prior radiograph, now with small foci of gas in the medial soft tissues. Irregular arthropathy at the tibial talar joint with bony destructive changes, grossly similar compared to prior radiograph. Multiple bony fragments anterior and posterior to the ankle joint. Mild periosteal reaction at the distal tibia  without change. Pes planus deformity. 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 In the interim, there is increased soft tissue swelling about the ankle with new small focus of gas in the medial soft tissues either due to recent surgery or acute infection. Electronically Signed   By: Jasmine Pang M.D.   On: 11/05/2019 03:14   MR FOOT RIGHT W WO CONTRAST  Result Date: 11/05/2019 CLINICAL DATA:  Diabetic patient with skin ulcerations on the right foot and ankle. EXAM: MRI OF THE RIGHT ANKLE WITHOUT AND WITH CONTRAST; MRI OF THE RIGHT FOREFOOT WITHOUT AND WITH CONTRAST TECHNIQUE: Multiplanar, multisequence MR imaging of the right ankle and forefoot was performed both before and after administration of intravenous contrast. CONTRAST:  9 mL GADAVIST IV COMPARISON:  Plain films right foot 10/24/2019 and 04/11/2017. Plain films right foot and ankle 10/22/2019. MRI right forefoot 04/11/2017. FINDINGS: Bones/Joint/Cartilage As seen on the most recent comparison plain films, there is bony destructive change about the tibiotalar joint. Intense marrow edema, periosteal reaction, postcontrast enhancement and decreased T1 signal are seen in the visualized  distal tibia and fibula extending at least 10 cm above the joint. There is also intense edema and enhancement throughout the calcaneus, talus and navicular. The patient has a large tibiotalar joint effusion with marked rim enhancement. Fluid extends out of the medial and lateral margins of the joint. On the lateral side of the joint, on the lateral side of the joint, fluid measures approximately 7 cm AP x 1 cm transverse x 7.5 cm craniocaudal. On the medial side, fluid measures 5.1 cm AP x 1.1 cm transverse x 4 cm craniocaudal. Air is seen within the fluid collection. No other evidence of osteomyelitis is seen in the ankle or foot. The patient is status post amputation of the first, third and fifth toes. Ligaments The anterior talofibular ligament and calcaneofibular ligament are torn. There is also likely at least partial tearing of the deltoid ligament. Muscles and Tendons Intact. A rim enhancing fluid collection in the distal flexor hallucis longus muscle belly measures 1.6 cm AP by 2 cm transverse by 1.5 cm craniocaudal. A gas containing fluid collection measuring 1.5 cm AP x 1 cm transverse by 1.3 cm craniocaudal is seen anterior to the tibialis posterior tendon approximately 4 cm above the tibiotalar joint consistent with abscess. Soft tissues Intense subcutaneous edema and enhancement are present about the ankle. IMPRESSION: Findings consistent with a septic tibiotalar joint and extensive osteomyelitis throughout the visualized distal tibia, fibula, calcaneus and talus. Extensive abscess formation about the joint is identified and there is marked cellulitis present. Complete tears of the ATFL and calcaneofibular ligaments. The deltoid ligament is also likely completely torn. Status post amputation of the first, third and fifth toes. Electronically Signed   By: Drusilla Kanner M.D.   On: 11/05/2019 11:45   MR ANKLE RIGHT W WO CONTRAST  Result Date: 11/05/2019 CLINICAL DATA:  Diabetic patient with skin  ulcerations on the right foot and ankle. EXAM: MRI OF THE RIGHT ANKLE WITHOUT AND WITH CONTRAST; MRI OF THE RIGHT FOREFOOT WITHOUT AND WITH CONTRAST TECHNIQUE: Multiplanar, multisequence MR imaging of the right ankle and forefoot was performed both before and after administration of intravenous contrast. CONTRAST:  9 mL GADAVIST IV COMPARISON:  Plain films right foot 10/24/2019 and 04/11/2017. Plain films right foot and ankle 10/22/2019. MRI right forefoot 04/11/2017. FINDINGS: Bones/Joint/Cartilage As seen on the most  recent comparison plain films, there is bony destructive change about the tibiotalar joint. Intense marrow edema, periosteal reaction, postcontrast enhancement and decreased T1 signal are seen in the visualized distal tibia and fibula extending at least 10 cm above the joint. There is also intense edema and enhancement throughout the calcaneus, talus and navicular. The patient has a large tibiotalar joint effusion with marked rim enhancement. Fluid extends out of the medial and lateral margins of the joint. On the lateral side of the joint, on the lateral side of the joint, fluid measures approximately 7 cm AP x 1 cm transverse x 7.5 cm craniocaudal. On the medial side, fluid measures 5.1 cm AP x 1.1 cm transverse x 4 cm craniocaudal. Air is seen within the fluid collection. No other evidence of osteomyelitis is seen in the ankle or foot. The patient is status post amputation of the first, third and fifth toes. Ligaments The anterior talofibular ligament and calcaneofibular ligament are torn. There is also likely at least partial tearing of the deltoid ligament. Muscles and Tendons Intact. A rim enhancing fluid collection in the distal flexor hallucis longus muscle belly measures 1.6 cm AP by 2 cm transverse by 1.5 cm craniocaudal. A gas containing fluid collection measuring 1.5 cm AP x 1 cm transverse by 1.3 cm craniocaudal is seen anterior to the tibialis posterior tendon approximately 4 cm above  the tibiotalar joint consistent with abscess. Soft tissues Intense subcutaneous edema and enhancement are present about the ankle. IMPRESSION: Findings consistent with a septic tibiotalar joint and extensive osteomyelitis throughout the visualized distal tibia, fibula, calcaneus and talus. Extensive abscess formation about the joint is identified and there is marked cellulitis present. Complete tears of the ATFL and calcaneofibular ligaments. The deltoid ligament is also likely completely torn. Status post amputation of the first, third and fifth toes. Electronically Signed   By: Drusilla Kanner M.D.   On: 11/05/2019 11:45        Scheduled Meds:  amiodarone  200 mg Oral BID   aspirin EC  81 mg Oral Daily   atorvastatin  20 mg Oral Daily   diltiazem  120 mg Oral Daily   DULoxetine  60 mg Oral Daily   gabapentin  600 mg Oral QHS   insulin aspart  0-5 Units Subcutaneous QHS   insulin aspart  0-9 Units Subcutaneous TID WC   Continuous Infusions:  sodium chloride 75 mL/hr at 11/04/19 2035   vancomycin 1,000 mg (11/05/19 0402)     LOS: 1 day    Time spent:    Erick Blinks, MD [facility]   If 7PM-7AM, please contact night-coverage [email or web address]  11/05/2019, 8:15 PM

## 2019-11-05 NOTE — Anesthesia Postprocedure Evaluation (Signed)
Anesthesia Post Note  Patient: [name]  Procedure(s) Performed: IRRIGATION AND DRAINAGE ABSCESS RIGHT ANKLE (Right Ankle)  Patient location during evaluation: PACU Anesthesia Type: MAC Level of consciousness: awake and alert and oriented Pain management: pain level controlled Vital Signs Assessment: post-procedure vital signs reviewed and stable Respiratory status: spontaneous breathing Cardiovascular status: blood pressure returned to baseline Postop Assessment: no apparent nausea or vomiting Anesthetic complications: no     Last Vitals:  Vitals:   11/05/19 1106 11/05/19 1315  BP: 125/77 (!) 95/51  Pulse: 80 74  Resp: 18 16  Temp: 36.9 C 36.7 C  SpO2: 98% 94%    Last Pain:  Vitals:   11/05/19 1315  TempSrc:   PainSc: 0-No pain                 Zykeriah Mathia

## 2019-11-05 NOTE — Progress Notes (Signed)
Dexcom insulin monitor removed from right arm for x-ray purposes. Pt ok with this stating to go ahead if need be and that he only has one more at home. Removed with no issue. Will continue to monitor blood sugars, pt aware.

## 2019-11-05 NOTE — Anesthesia Preprocedure Evaluation (Addendum)
Anesthesia Evaluation  Patient identified by MRN, date of birth, ID band Patient awake    Reviewed: Allergy & Precautions, NPO status , Patient's Chart, lab work & pertinent test results  Airway        Dental   Pulmonary neg pulmonary ROS,    Pulmonary exam normal breath sounds clear to auscultation       Cardiovascular hypertension, Pt. on medications + dysrhythmias Atrial Fibrillation  Rhythm:Irregular Rate:Normal  IMPRESSIONS    1. Left ventricular ejection fraction, by visual estimation, is 60 to 65%. The left ventricle has normal function. There is no left ventricular hypertrophy.  2. The left ventricle has no regional wall motion abnormalities.  3. Global right ventricle has normal systolic function.The right ventricular size is normal. Right vetricular wall thickness was not assessed.  4. Left atrial size was normal.  5. Right atrial size was normal.  6. The mitral valve is normal in structure. No evidence of mitral valve regurgitation.  7. The tricuspid valve is normal in structure.  8. The aortic valve is normal in structure. Aortic valve regurgitation is not visualized.  9. The pulmonic valve was normal in structure. Pulmonic valve regurgitation is trivial. 04-Nov-2019 20:18:22 [facility] ROUTINE RECORD Normal sinus rhythm Septal infarct , age undetermined Abnormal ECG   Neuro/Psych  Neuromuscular disease negative psych ROS   GI/Hepatic negative GI ROS, Neg liver ROS,   Endo/Other  diabetes, Poorly Controlled, Type 2, Insulin Dependent, Oral Hypoglycemic Agents  Renal/GU Renal InsufficiencyRenal disease     Musculoskeletal  (+) Arthritis ,   Abdominal   Peds  Hematology   Anesthesia Other Findings   Reproductive/Obstetrics                           Anesthesia Physical Anesthesia Plan  ASA: III  Anesthesia Plan: MAC   Post-op Pain Management:     Induction: Intravenous  PONV Risk Score and Plan:   Airway Management Planned: Nasal Cannula, Natural Airway and Simple Face Mask  Additional Equipment:   Intra-op Plan:   Post-operative Plan:   Informed Consent: I have reviewed the patients History and Physical, chart, labs and discussed the procedure including the risks, benefits and alternatives for the proposed anesthesia with the patient or authorized representative who has indicated his/her understanding and acceptance.     Dental advisory given  Plan Discussed with: CRNA  Anesthesia Plan Comments:         Anesthesia Quick Evaluation

## 2019-11-05 NOTE — Progress Notes (Signed)
Podiatry Progress Note  Subjective MRI just completed.  Feeling well.  No new complaints.  Objective Vital signs in last 24 hours:   Temp:  [98.4 F (36.9 C)-98.8 F (37.1 C)] 98.5 F (36.9 C) (12/30 1106) Pulse Rate:  [77-86] 80 (12/30 1106) Resp:  [18-20] 18 (12/30 1106) BP: (125-140)/(72-80) 125/77 (12/30 1106) SpO2:  [94 %-99 %] 98 % (12/30 1106) Weight:  [90 kg] 90 kg (12/29 1802)  GENERAL:  Alert, NAD. DRESSING:  Intact without strikethrough. INTEGUMENT:  S/P recent amputation of R 3rd toe.  Amputation site open and packed with packing gauze. No erythema from forefoot to midfoot. Skin surrounding R ankle is red and warm.  The medial and lateral aspects of the ankle are fluctuant. VASCULAR:  R DP pulse is palpable.  R PT pulse is obscurred by edema.  Edema of R foot and ankle.    Lab/Test Results  Recent Labs    11/04/19 2226 11/05/19 0649  WBC 10.8*  --   HGB 10.0*  --   HCT 31.4*  --   PLT 428*  --   NA 126* 132*  K 3.6 3.7  CL 90* 95*  CO2 27 28  BUN 8 7  CREATININE 0.73 0.65  GLUCOSE 284* 240*  CALCIUM 7.8* 7.8*    Recent Results (from the past 240 hour(s))  Culture, blood (routine x 2)     Status: None (Preliminary result)   Collection Time: 11/04/19 10:21 PM   Specimen: BLOOD RIGHT ARM  Result Value Ref Range Status   Specimen Description BLOOD RIGHT ARM  Final   Special Requests   Final    BOTTLES DRAWN AEROBIC AND ANAEROBIC Blood Culture adequate volume   Culture   Final    NO GROWTH < 12 HOURS Performed at [facility], [address] [place], [place] [address]    Report Status PENDING  Incomplete  Culture, blood (routine x 2)     Status: None (Preliminary result)   Collection Time: 11/04/19 10:26 PM   Specimen: BLOOD RIGHT HAND  Result Value Ref Range Status   Specimen Description BLOOD RIGHT HAND  Final   Special Requests   Final    BOTTLES DRAWN AEROBIC AND ANAEROBIC Blood Culture adequate volume   Culture   Final    NO GROWTH < 12  HOURS Performed at [facility], [address] [place], [place] [address]    Report Status PENDING  Incomplete  Respiratory Panel by RT PCR (Flu A&B, Covid) - Nasopharyngeal Swab     Status: None   Collection Time: 11/05/19  9:20 AM   Specimen: Nasopharyngeal Swab  Result Value Ref Range Status   SARS Coronavirus 2 by RT PCR NEGATIVE NEGATIVE Final    Comment: (NOTE) SARS-CoV-2 target nucleic acids are NOT DETECTED. The SARS-CoV-2 RNA is generally detectable in upper respiratoy specimens during the acute phase of infection. The lowest concentration of SARS-CoV-2 viral copies this assay can detect is 131 copies/mL. A negative result does not preclude SARS-Cov-2 infection and should not be used as the sole basis for treatment or other patient management decisions. A negative result may occur with  improper specimen collection/handling, submission of specimen other than nasopharyngeal swab, presence of viral mutation(s) within the areas targeted by this assay, and inadequate number of viral copies (<131 copies/mL). A negative result must be combined with clinical observations, patient history, and epidemiological information. The expected result is Negative. Fact Sheet for Patients:  https://www.moore.com/https://www.fda.gov/media/142436/download Fact Sheet for Healthcare Providers:  https://www.young.biz/ This test is not yet ap proved or cleared by the [place] and  has been authorized for detection and/or diagnosis of SARS-CoV-2 by FDA under an Emergency Use Authorization (EUA). This EUA will remain  in effect (meaning this test can be used) for the duration of the COVID-19 declaration under Section 564(b)(1) of the Act, 21 U.S.C. section 360bbb-3(b)(1), unless the authorization is terminated or revoked sooner.    Influenza A by PCR NEGATIVE NEGATIVE Final   Influenza B by PCR NEGATIVE NEGATIVE Final    Comment: (NOTE) The Xpert Xpress SARS-CoV-2/FLU/RSV assay is intended  as an aid in  the diagnosis of influenza from Nasopharyngeal swab specimens and  should not be used as a sole basis for treatment. Nasal washings and  aspirates are unacceptable for Xpert Xpress SARS-CoV-2/FLU/RSV  testing. Fact Sheet for Patients: https://www.moore.com/ Fact Sheet for Healthcare Providers: https://www.young.biz/ This test is not yet approved or cleared by the [place] FDA and  has been authorized for detection and/or diagnosis of SARS-CoV-2 by  FDA under an Emergency Use Authorization (EUA). This EUA will remain  in effect (meaning this test can be used) for the duration of the  Covid-19 declaration under Section 564(b)(1) of the Act, 21  U.S.C. section 360bbb-3(b)(1), unless the authorization is  terminated or revoked. Performed at [facility], [address] [place], [place] [address]      DG Ankle Complete Right  Result Date: 11/05/2019 CLINICAL DATA:  Recent ankle surgery with increased swelling and redness EXAM: RIGHT ANKLE - COMPLETE 3+ VIEW COMPARISON:  10/22/2019, 06/19/2016 FINDINGS: Increased soft tissue swelling compared to prior radiograph, now with small foci of gas in the medial soft tissues. Irregular arthropathy at the tibial talar joint with bony destructive changes, grossly similar compared to prior radiograph. Multiple bony fragments anterior and posterior to the ankle joint. Mild periosteal reaction at the distal tibia without change. Pes planus deformity. 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 In the interim, there is increased soft tissue swelling about the ankle with new small focus of gas in the medial soft tissues either due to recent surgery or acute infection. Electronically Signed   By: Jasmine Pang M.D.   On: 11/05/2019 03:14    Medications Scheduled Meds: . [MAR  Hold] amiodarone  200 mg Oral BID  . [MAR Hold] aspirin EC  81 mg Oral Daily  . [MAR Hold] atorvastatin  20 mg Oral Daily  . [MAR Hold] diltiazem  120 mg Oral Daily  . [MAR Hold] DULoxetine  60 mg Oral Daily  . [MAR Hold] gabapentin  600 mg Oral QHS  . [MAR Hold] insulin aspart  0-5 Units Subcutaneous QHS  . [MAR Hold] insulin aspart  0-9 Units Subcutaneous TID WC   Continuous Infusions: . sodium chloride 75 mL/hr at 11/04/19 2035  . [MAR Hold] vancomycin 1,000 mg (11/05/19 0402)   PRN Meds:.[MAR Hold] acetaminophen **OR** [MAR Hold] acetaminophen, [MAR Hold] albuterol, [MAR Hold] cyclobenzaprine, [MAR Hold] diphenhydrAMINE, [MAR Hold] gadobutrol, [MAR Hold] hydrALAZINE, [MAR Hold] HYDROcodone-acetaminophen, [MAR Hold]  morphine injection, [MAR Hold] ondansetron **OR** [MAR Hold] ondansetron (ZOFRAN) IV  Assessment/Plan: 1.  Cellulitis with abscess, R ankle             Proceed with I&D planned.              Consent signed in chart.  NPO since midnight              2.  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  DVT, RLE             Management per IM  Brita Romp 11/05/2019, 11:31 AM

## 2019-11-05 NOTE — Op Note (Signed)
OPERATIVE NOTE  DATE OF PROCEDURE 11/05/2019  SURGEON Marcheta Grammes, DPM  ASSISTANT SURGEON Jilda Panda, DPM  OR STAFF Circulator: Cox, Gershon Mussel, RN Scrub Person: Lucie Leather, CST Float Surgical Tech: Karin Lieu, CST   PREOPERATIVE DIAGNOSIS 1.  Abscess, right ankle 2.  Charcoal arthropathy, right ankle 3.  Diabetic neuropathy  POSTOPERATIVE DIAGNOSIS Same  PROCEDURE 1.  Incision and drainage of abscess, right ankle  ANESTHESIA Monitor Anesthesia Care   HEMOSTASIS Pneumatic thigh tourniquet applied but never inflated  ESTIMATED BLOOD LOSS 100 cc  MATERIALS USED Iodoform packing gauze Topical thrombin patch Bone biopsy needle  INJECTABLES 0.5% Marcaine plain  PATHOLOGY 1.  Soft tissue to microbiology for culture and sensitivity 2.  Bone from right fibula to microbiology for culture and sensitivity 3.  Bone biopsy from right fibula to pathology  COMPLICATIONS None  INDICATIONS:  Abscess R ankle confirmed by MRI.    DESCRIPTION OF THE PROCEDURE:  The patient was brought to the operating room and placed on the operative table in the supine position.  A pneumatic thigh tourniquet was applied to the operative extremity.  The tourniquet was never inflated during any portion of the procedure.  Following sedation, the surgical site was anesthetized with 0.5% Marcaine plain.  The extremity was prepped, scrubbed, and draped in the usual sterile technique.   The medial and lateral aspects of the right ankle were fluctuant.  A linear skin incision was made along the medial aspect of the ankle using a #15 blade.  The soft tissue was bluntly dissected using a hemostat.  Seropurulent fluid was expressed.  The surgical wound was debrided of nonviable tissue.  The wound tracked distally to the porta pedis and proximally about 4 cm above the ankle joint.  The distal tibia was palpated and felt firm.  However, significant crepitus was noted with range of motion of  the ankle.   Attention was directed to the lateral aspect of the ankle.  A curvilinear incision was made along the lateral ankle.  The soft tissue  was bluntly dissected using a hemostat.  Seropurulent fluid was expressed.  The surgical wound was debrided of nonviable tissue.  Soft tissue was removed and sent to microbiology for aerobic and anaerobic culture and sensitivity.  The wound tracked distally to the sinus tarsi/lateral calcaneus and proximally about 6-7 cm above the ankle joint.  The distal fibula was palpated and felt firm.  A bone rongeur was used to obtain a sample of the distal fibula for aerobic and anaerobic culture and sensitivity.  A bone biopsy needle was used to obtain a cylindrical bone sample from the distal fibula.  The bone biopsy was sent to pathology for evaluation.   The surgical wounds were irrigated with a total of 3L sterile saline using pulse lavage.  Hemostasis was achieved with topical thrombin patches.  Both surgical wounds were packed with Iodoform packing gauze.  A sterile compressive dressing was applied.  The thigh tourniquet was removed.     The patient tolerated the procedure well.  The patient was then transferred to PACU with vital signs stable and vascular status intact to all remaining toes of the operative foot.

## 2019-11-05 NOTE — Progress Notes (Signed)
Holding AM meds for procedure.

## 2019-11-05 NOTE — Transfer of Care (Signed)
Immediate Anesthesia Transfer of Care Note  Patient: [name]  Procedure(s) Performed: IRRIGATION AND DRAINAGE ABSCESS RIGHT ANKLE (Right Ankle)  Patient Location: PACU  Anesthesia Type:MAC  Level of Consciousness: awake  Airway & Oxygen Therapy: Patient Spontanous Breathing  Post-op Assessment: Report given to RN  Post vital signs: Reviewed  Last Vitals:  Vitals Value Taken Time  BP    Temp    Pulse 74 11/05/19 1316  Resp    SpO2 94 % 11/05/19 1316  Vitals shown include unvalidated device data.  Last Pain:  Vitals:   11/05/19 1106  TempSrc: Oral  PainSc: 0-No pain         Complications: No apparent anesthesia complications

## 2019-11-06 LAB — GLUCOSE, CAPILLARY
Glucose-Capillary: 169 mg/dL — ABNORMAL HIGH (ref 70–99)
Glucose-Capillary: 204 mg/dL — ABNORMAL HIGH (ref 70–99)
Glucose-Capillary: 189 mg/dL — ABNORMAL HIGH (ref 70–99)
Glucose-Capillary: 247 mg/dL — ABNORMAL HIGH (ref 70–99)

## 2019-11-06 LAB — CBC WITH DIFFERENTIAL/PLATELET
Abs Immature Granulocytes: 0.08 10*3/uL — ABNORMAL HIGH (ref 0.00–0.07)
Basophils Absolute: 0 10*3/uL (ref 0.0–0.1)
Basophils Relative: 1 %
Eosinophils Absolute: 0.1 10*3/uL (ref 0.0–0.5)
Eosinophils Relative: 1 %
HCT: 25.1 % — ABNORMAL LOW (ref 39.0–52.0)
Hemoglobin: 7.9 g/dL — ABNORMAL LOW (ref 13.0–17.0)
Immature Granulocytes: 1 %
Lymphocytes Relative: 19 %
Lymphs Abs: 1.2 10*3/uL (ref 0.7–4.0)
MCH: 30.2 pg (ref 26.0–34.0)
MCHC: 31.5 g/dL (ref 30.0–36.0)
MCV: 95.8 fL (ref 80.0–100.0)
Monocytes Absolute: 0.6 10*3/uL (ref 0.1–1.0)
Monocytes Relative: 9 %
Neutro Abs: 4.5 10*3/uL (ref 1.7–7.7)
Neutrophils Relative %: 69 %
Platelets: 398 10*3/uL (ref 150–400)
RBC: 2.62 MIL/uL — ABNORMAL LOW (ref 4.22–5.81)
RDW: 12.8 % (ref 11.5–15.5)
WBC: 6.5 10*3/uL (ref 4.0–10.5)
nRBC: 0 % (ref 0.0–0.2)

## 2019-11-06 LAB — BASIC METABOLIC PANEL
Anion gap: 8 (ref 5–15)
BUN: 8 mg/dL (ref 6–20)
CO2: 28 mmol/L (ref 22–32)
Calcium: 7.6 mg/dL — ABNORMAL LOW (ref 8.9–10.3)
Chloride: 93 mmol/L — ABNORMAL LOW (ref 98–111)
Creatinine, Ser: 0.64 mg/dL (ref 0.61–1.24)
GFR calc Af Amer: >60 mL/min (ref >60)
GFR calc non Af Amer: >60 mL/min (ref >60)
Glucose, Bld: 220 mg/dL — ABNORMAL HIGH (ref 70–99)
Potassium: 3.6 mmol/L (ref 3.5–5.1)
Sodium: 129 mmol/L — ABNORMAL LOW (ref 135–145)

## 2019-11-06 LAB — SEDIMENTATION RATE: Sed Rate: 80 mm/hr — ABNORMAL HIGH (ref 0–16)

## 2019-11-06 NOTE — Progress Notes (Addendum)
Podiatry Progress Note  Subjective POD 1 S/P I&D R ankle abscess.  Feeling well.  Pain well controlled.  No nausea, vomiting, chest pain or shortness of breath.  Objective Vital signs in last 24 hours:   Temp:  [97.2 F (36.2 C)-97.7 F (36.5 C)] 97.7 F (36.5 C) (12/31 0526) Pulse Rate:  [78-89] 78 (12/31 0526) Resp:  [16-18] 16 (12/31 0526) BP: (108-113)/(65) 113/65 (12/31 0526) SpO2:  [97 %-98 %] 97 % (12/31 0526)  DRESSING:  Intact with faint pink colored strikethrough. INTEGUMENT:  Surgical wound along medial and lateral aspects of R ankle.  Skin edges appear viable.  Significant improvement in erythema.  No malodor.  No active bleeding. VASCULAR:  Palpable pedal pulses.  Edema RLE improved.  Lab/Test Results  Recent Labs    11/04/19 2226 11/05/19 0649 11/06/19 0517  WBC 10.8*  --  6.5  HGB 10.0*  --  7.9*  HCT 31.4*  --  25.1*  PLT 428*  --  398  NA 126* 132* 129*  K 3.6 3.7 3.6  CL 90* 95* 93*  CO2 27 28 28   BUN 8 7 8   CREATININE 0.73 0.65 0.64  GLUCOSE 284* 240* 220*  CALCIUM 7.8* 7.8* 7.6*    Recent Results (from the past 240 hour(s))  Culture, blood (routine x 2)     Status: None (Preliminary result)   Collection Time: 11/04/19 10:21 PM   Specimen: BLOOD RIGHT ARM  Result Value Ref Range Status   Specimen Description BLOOD RIGHT ARM  Final   Special Requests   Final    BOTTLES DRAWN AEROBIC AND ANAEROBIC Blood Culture adequate volume   Culture   Final    NO GROWTH 2 DAYS Performed at [facility], [address] [place], [place] [address]    Report Status PENDING  Incomplete  Culture, blood (routine x 2)     Status: None (Preliminary result)   Collection Time: 11/04/19 10:26 PM   Specimen: BLOOD RIGHT HAND  Result Value Ref Range Status   Specimen Description BLOOD RIGHT HAND  Final   Special Requests   Final    BOTTLES DRAWN AEROBIC AND ANAEROBIC Blood Culture adequate volume   Culture   Final    NO GROWTH 2 DAYS Performed at [facility], [address] [place], [place] [address]    Report Status PENDING  Incomplete  Respiratory Panel by RT PCR (Flu A&B, Covid) - Nasopharyngeal Swab     Status: None   Collection Time: 11/05/19  9:20 AM   Specimen: Nasopharyngeal Swab  Result Value Ref Range Status   SARS Coronavirus 2 by RT PCR NEGATIVE NEGATIVE Final    Comment: (NOTE) SARS-CoV-2 target nucleic acids are NOT DETECTED. The SARS-CoV-2 RNA is generally detectable in upper respiratoy specimens during the acute phase of infection. The lowest concentration of SARS-CoV-2 viral copies this assay can detect is 131 copies/mL. A negative result does not preclude SARS-Cov-2 infection and should not be used as the sole basis for treatment or other patient management decisions. A negative result may occur with  improper specimen collection/handling, submission of specimen other than nasopharyngeal swab, presence of viral mutation(s) within the areas targeted by this assay, and inadequate number of viral copies (<131 copies/mL). A negative result must be combined with clinical observations, patient history, and epidemiological information. The expected result is Negative. Fact Sheet for Patients:  https://www.moore.com/https://www.fda.gov/media/142436/download Fact Sheet for Healthcare Providers:  https://www.young.biz/https://www.fda.gov/media/142435/download This test is not yet ap proved or cleared by  the [employer] and  has been authorized for detection and/or diagnosis of SARS-CoV-2 by FDA under an Emergency Use Authorization (EUA). This EUA will remain  in effect (meaning this test can be used) for the duration of the COVID-19 declaration under Section 564(b)(1) of the Act, 21 U.S.C. section 360bbb-3(b)(1), unless the authorization is terminated or revoked sooner.    Influenza A by PCR NEGATIVE NEGATIVE Final   Influenza B by PCR NEGATIVE NEGATIVE Final    Comment: (NOTE) The Xpert Xpress SARS-CoV-2/FLU/RSV assay is intended as an aid in  the diagnosis  of influenza from Nasopharyngeal swab specimens and  should not be used as a sole basis for treatment. Nasal washings and  aspirates are unacceptable for Xpert Xpress SARS-CoV-2/FLU/RSV  testing. Fact Sheet for Patients: https://www.moore.com/ Fact Sheet for Healthcare Providers: https://www.young.biz/ This test is not yet approved or cleared by the [place] FDA and  has been authorized for detection and/or diagnosis of SARS-CoV-2 by  FDA under an Emergency Use Authorization (EUA). This EUA will remain  in effect (meaning this test can be used) for the duration of the  Covid-19 declaration under Section 564(b)(1) of the Act, 21  U.S.C. section 360bbb-3(b)(1), unless the authorization is  terminated or revoked. Performed at [facility], [address] [place], [place] [address]   Aerobic/Anaerobic Culture (surgical/deep wound)     Status: None (Preliminary result)   Collection Time: 11/05/19  1:34 PM   Specimen: Soft Tissue, Other  Result Value Ref Range Status   Specimen Description   Final    BONE RIGHT FIBULA BX Performed at [facility], [address], [place], [place] [address]    Special Requests   Final    VANCOMYCIN Performed at [facility], [address] [place], [place] [address]    Gram Stain   Final    MODERATE WBC PRESENT,BOTH PMN AND MONONUCLEAR FEW GRAM POSITIVE COCCI    Culture   Final    CULTURE REINCUBATED FOR BETTER GROWTH Performed at [facility], [address], [place], [place] [address]    Report Status PENDING  Incomplete  Aerobic/Anaerobic Culture (surgical/deep wound)     Status: None (Preliminary result)   Collection Time: 11/05/19  1:37 PM   Specimen: Soft Tissue, Other  Result Value Ref Range Status   Specimen Description   Final    TISSUE RIGHT ANKLE Performed at [facility], [address], [place], [place] [address]    Special Requests   Final    VANCOMYCIN Performed at [facility], [address] [place], [place] [address]    Gram Stain   Final    FEW WBC PRESENT,BOTH PMN AND MONONUCLEAR ABUNDANT GRAM POSITIVE COCCI    Culture   Final    CULTURE REINCUBATED FOR BETTER GROWTH Performed at [facility], [address] [place], [place] [address]    Report Status PENDING  Incomplete     DG Ankle Complete Right  Result Date: 11/05/2019 CLINICAL DATA:  Recent ankle surgery with increased swelling and redness EXAM: RIGHT ANKLE - COMPLETE 3+ VIEW COMPARISON:  10/22/2019, 06/19/2016 FINDINGS: Increased soft tissue swelling compared to prior radiograph, now with small foci of gas in the medial soft tissues. Irregular arthropathy at the tibial talar joint with bony destructive changes, grossly similar compared to prior radiograph. Multiple bony fragments anterior and posterior to the ankle joint. Mild periosteal reaction at the distal tibia without change. Pes planus deformity. 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 In the interim, there is increased soft tissue swelling about the ankle with new small focus of gas in the medial soft tissues either due to recent surgery or acute infection. Electronically Signed   By: Jasmine Pang M.D.   On: 11/05/2019 03:14   MR FOOT RIGHT W WO CONTRAST  Result Date: 11/05/2019 CLINICAL DATA:  Diabetic patient with skin ulcerations on the right foot and ankle. EXAM: MRI OF THE RIGHT ANKLE WITHOUT AND WITH CONTRAST; MRI OF THE RIGHT FOREFOOT WITHOUT AND WITH CONTRAST TECHNIQUE: Multiplanar, multisequence MR imaging of the right ankle and forefoot was performed both before and after administration of intravenous contrast. CONTRAST:  9 mL GADAVIST IV COMPARISON:  Plain films right foot 10/24/2019 and 04/11/2017. Plain films right foot and ankle 10/22/2019. MRI right forefoot 04/11/2017. FINDINGS:  Bones/Joint/Cartilage As seen on the most recent comparison plain films, there is bony destructive change about the tibiotalar joint. Intense marrow edema, periosteal reaction, postcontrast enhancement and decreased T1 signal are seen in the visualized distal tibia and fibula extending at least 10 cm above the joint. There is also intense edema and enhancement throughout the calcaneus, talus and navicular. The patient has a large tibiotalar joint effusion with marked rim enhancement. Fluid extends out of the medial and lateral margins of the joint. On the lateral side of the joint, on the lateral side of the joint, fluid measures approximately 7 cm AP x 1 cm transverse x 7.5 cm craniocaudal. On the medial side, fluid measures 5.1 cm AP x 1.1 cm transverse x 4 cm craniocaudal. Air is seen within the fluid collection. No other evidence of osteomyelitis is seen in the ankle or foot. The patient is status post amputation of the first, third and fifth toes. Ligaments The anterior talofibular ligament and calcaneofibular ligament are torn. There is also likely at least partial tearing of the deltoid ligament. Muscles and Tendons Intact. A rim enhancing fluid collection in the distal flexor hallucis longus muscle belly measures 1.6 cm AP by 2 cm transverse by 1.5 cm craniocaudal. A gas containing fluid collection measuring 1.5 cm AP x 1 cm transverse by 1.3 cm craniocaudal is seen anterior to the tibialis posterior tendon approximately 4 cm above the tibiotalar joint consistent with abscess. Soft tissues Intense subcutaneous edema and enhancement are present about the ankle. IMPRESSION: Findings consistent with a septic tibiotalar joint and extensive osteomyelitis throughout the visualized distal tibia, fibula, calcaneus and talus. Extensive abscess formation about the joint is identified and there is marked cellulitis present. Complete tears of the ATFL and calcaneofibular ligaments. The deltoid ligament is also likely  completely torn. Status post amputation of the first, third and fifth toes. Electronically Signed   By: Drusilla Kanner M.D.   On: 11/05/2019 11:45   MR ANKLE RIGHT W WO CONTRAST  Result Date: 11/05/2019 CLINICAL DATA:  Diabetic patient with skin ulcerations on the right foot and ankle. EXAM: MRI OF THE RIGHT ANKLE WITHOUT AND WITH CONTRAST; MRI OF THE RIGHT FOREFOOT WITHOUT AND WITH CONTRAST TECHNIQUE: Multiplanar, multisequence MR imaging of the right ankle and forefoot was performed both before and after administration of intravenous contrast. CONTRAST:  9 mL GADAVIST IV COMPARISON:  Plain films right foot 10/24/2019 and 04/11/2017. Plain films right foot and ankle 10/22/2019. MRI right forefoot 04/11/2017. FINDINGS: Bones/Joint/Cartilage As seen on the most recent comparison plain films, there is bony destructive change  about the tibiotalar joint. Intense marrow edema, periosteal reaction, postcontrast enhancement and decreased T1 signal are seen in the visualized distal tibia and fibula extending at least 10 cm above the joint. There is also intense edema and enhancement throughout the calcaneus, talus and navicular. The patient has a large tibiotalar joint effusion with marked rim enhancement. Fluid extends out of the medial and lateral margins of the joint. On the lateral side of the joint, on the lateral side of the joint, fluid measures approximately 7 cm AP x 1 cm transverse x 7.5 cm craniocaudal. On the medial side, fluid measures 5.1 cm AP x 1.1 cm transverse x 4 cm craniocaudal. Air is seen within the fluid collection. No other evidence of osteomyelitis is seen in the ankle or foot. The patient is status post amputation of the first, third and fifth toes. Ligaments The anterior talofibular ligament and calcaneofibular ligament are torn. There is also likely at least partial tearing of the deltoid ligament. Muscles and Tendons Intact. A rim enhancing fluid collection in the distal flexor hallucis  longus muscle belly measures 1.6 cm AP by 2 cm transverse by 1.5 cm craniocaudal. A gas containing fluid collection measuring 1.5 cm AP x 1 cm transverse by 1.3 cm craniocaudal is seen anterior to the tibialis posterior tendon approximately 4 cm above the tibiotalar joint consistent with abscess. Soft tissues Intense subcutaneous edema and enhancement are present about the ankle. IMPRESSION: Findings consistent with a septic tibiotalar joint and extensive osteomyelitis throughout the visualized distal tibia, fibula, calcaneus and talus. Extensive abscess formation about the joint is identified and there is marked cellulitis present. Complete tears of the ATFL and calcaneofibular ligaments. The deltoid ligament is also likely completely torn. Status post amputation of the first, third and fifth toes. Electronically Signed   By: Inge Rise M.D.   On: 11/05/2019 11:45    Medications Scheduled Meds: . amiodarone  200 mg Oral BID  . apixaban  5 mg Oral BID  . aspirin EC  81 mg Oral Daily  . atorvastatin  20 mg Oral Daily  . diltiazem  120 mg Oral Daily  . DULoxetine  60 mg Oral Daily  . gabapentin  600 mg Oral QHS  . insulin aspart  0-5 Units Subcutaneous QHS  . insulin aspart  0-9 Units Subcutaneous TID WC  . insulin glargine  20 Units Subcutaneous QHS   Continuous Infusions: . sodium chloride 75 mL/hr at 11/04/19 2035  . vancomycin 1,000 mg (11/06/19 1300)   PRN Meds:.acetaminophen **OR** acetaminophen, albuterol, cyclobenzaprine, diphenhydrAMINE, gadobutrol, hydrALAZINE, HYDROcodone-acetaminophen, morphine injection, ondansetron **OR** ondansetron (ZOFRAN) IV  Assessment/Plan: 1. Cellulitis with abscess, R ankle  I&D R ankle performed 11/05/2019             Changed dressing today.  Will change packing/place Wound VAC tomorrow.    Discussed need for Wound VAC with case management.  Home health will need to change VAC 3 times weekly.  Patient understands that he may still  require a more proximal amputation.  Continue IV antibiotics.  Defer antibiotic selection and length of therapy to ID.  2. 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 DVT, RLE Management per IM  Brita Romp 11/06/2019, 1:54 PM

## 2019-11-06 NOTE — TOC Initial Note (Signed)
Transition of Care [facility]) - Initial/Assessment Note    Patient Details  Name: [name] MRN: [id number] Date of Birth: [birth year]  Transition of Care [facility]) CM/SW Contact:    Boneta Lucks, RN Phone Number: 11/06/2019, 2:37 PM  Clinical Narrative:   Patient admitted for cellulitis and abscess of foot. Dr [name] consulted. Patient will require wound vac with dressing changes 3 times a week. Called Blake Divine with [facility]. She will deliver wound vac to the room for MD to place on patient. Patient is active with [facility], Updated Romualdo Bolk.               Expected Discharge Plan: [place] Barriers to Discharge: Continued Medical Work up   Patient Goals and CMS Choice Patient states their goals for this hospitalization and ongoing recovery are:: to go home with Home Health.   Expected Discharge Plan and Services Expected Discharge Plan: [place]                    DME Arranged: (wound VAC) DME Agency: [employer] Date DME Agency Contacted: 11/06/19 Time DME Agency Contacted: [phone] Representative spoke with at DME Agency: Blake Divine     Prior Living Arrangements/Services     Activities of [place] Devices/Equipment: Eyeglasses, [place], Prosthesis, [place] (specify type), Wheelchair, Raised toilet seat with rails ADL Screening (condition at time of admission) Patient's cognitive ability adequate to safely complete daily activities?: Yes Is the patient deaf or have difficulty hearing?: No Does the patient have difficulty seeing, even when wearing glasses/contacts?: No Does the patient have difficulty concentrating, remembering, or making decisions?: No Patient able to express need for assistance with ADLs?: Yes Does the patient have difficulty dressing or bathing?: No Independently performs ADLs?: Yes (appropriate for developmental age) Does the patient have difficulty walking or climbing  stairs?: No Weakness of Legs: None Weakness of Arms/Hands: None  Permission Sought/Granted    Emotional Assessment     Admission diagnosis:  Cellulitis [L03.90] Patient Active Problem List   Diagnosis Date Noted  . Cellulitis 11/04/2019  . Acute deep vein thrombosis (DVT) of right popliteal vein ([place]) 10/26/2019  . Staphylococcus aureus sepsis ([place]) 10/25/2019  . Sepsis ([place])   . Staphylococcus aureus bacteremia 10/23/2019  . DKA, type 2 ([place]) 10/23/2019  . Uncontrolled type 2 diabetes mellitus with hyperglycemia, with long-term current use of insulin ([place]) 10/23/2019  . Severe sepsis ([place]) 10/22/2019  . Cellulitis of right lower extremity 10/22/2019  . Elevated lactic acid level 10/22/2019  . Leukocytosis 10/22/2019  . A-fib ([place]) 10/22/2019  . Nausea & vomiting 10/22/2019  . Diarrhea 10/22/2019  . AKI (acute kidney injury) ([place]) 10/22/2019  . Hypokalemia 10/22/2019  . Acute hyponatremia 10/22/2019  . Lumbar spine scoliosis 08/13/2018  . S/P BKA (below knee amputation) unilateral, left ([place]) 01/12/2018  . Pain management contract signed 05/04/2017  . Diabetic foot infection ([place]) 04/11/2017  . Cellulitis and abscess of foot 02/08/2017  . Peripheral edema 10/02/2016  . Polycythemia 12/09/2015  . Dizziness 12/09/2015  . Type 2 diabetes mellitus ([place]) 07/22/2015  . Osteomyelitis of toe of right foot ([place]) 06/13/2014  . Neuropathy in diabetes ([place]) 03/31/2014  . Charcot ankle 03/09/2014  . Essential hypertension, benign 03/03/2013  . DDD (degenerative disc disease), lumbar 03/03/2013   PCP:  Dettinger, [name], MD Pharmacy:   [id number] - [place], [place]  [id number] Phone: [phone] Fax: [phone]  [facility] - [place], [place] - [address] [place] [address] Phone: [phone] Fax: [phone]    Readmission Risk Interventions No flowsheet  data found.

## 2019-11-06 NOTE — Progress Notes (Signed)
PROGRESS NOTE    France RavensKeith B Eaves  BJY:[id number] DOB: [birth year] DOA: 11/04/2019 PCP: Dettinger, Elige RadonJoshua A, MD    Brief Narrative:  53 year old male with history of hypertension, atrial fibrillation, recent DVT, diabetes, left BKA, most recently to hospital for partial ray amputation.  At that time, he was found to have sepsis with bacteremia and was discharged home on intravenous antibiotics.  On follow-up with his podiatrist, he was noted to have significant cellulitis and abscess of the right ankle.  He was admitted for further intervention.   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 Cellulitis and abscess of right ankle.  Status post incision and drainage.  Follow-up cultures, initial Gram stain shows gram-positive cocci.  Currently on on vancomycin since he had a reaction to Ancef as an outpatient. 2. DVT.  Recent right popliteal DVT.  He was prescribed Eliquis, which is on hold for anemia. 3. Anemia.  Possibly related to acute blood loss postoperatively in the setting of recent anticoagulation.  Will hold further Eliquis.  He does not describe any ongoing bleeding.  There may also be an element of hemodilution from IV fluids.  Continue to monitor hemoglobin. 4. Diabetes.  Holding Metformin.  On sliding scale insulin and Lantus.  Blood sugar stable 5. Atrial fibrillation.  Continue amiodarone and diltiazem.  Anticoagulation currently on hold due to anemia 6. Peripheral vascular disease.  Secondary to poorly controlled diabetes and hyperlipidemia.  Continue aspirin. 7. Chronic pain.  Continue Norco and Flexeril. 8. Hyperlipidemia.  Continue Lipitor 9. Depression.  Continue Cymbalta.   DVT prophylaxis: Eliquis Code Status:  Full code Family Communication: None present Disposition Plan: Discharge home culture results are available to determine antibiotic course.   Consultants:   Podiatry  Procedures:  Incision and drainage of abscess, right ankle  Antimicrobials:   Vancomycin 12/30 >   Subjective: No new complaints.  No chest pain or shortness of breath.  Objective: Vitals:   11/05/19 1315 11/05/19 1330 11/05/19 2138 11/06/19 0526  BP: (!) 95/51 120/74 108/65 113/65  Pulse: 74 77 89 78  Resp: 16 15 18 16   Temp: 98.1 F (36.7 C)  (!) 97.2 F (36.2 C) 97.7 F (36.5 C)  TempSrc:   Oral Oral  SpO2: 94% 100% 98% 97%  Weight:      Height:        Intake/Output Summary (Last 24 hours) at 11/06/2019 1959 Last data filed at 11/06/2019 1649 Gross per 24 hour  Intake 1204.42 ml  Output 2450 ml  Net -1245.58 ml   Filed Weights   11/04/19 1802  Weight: 90 kg    Examination:  General exam: Appears calm and comfortable  Respiratory system: Clear to auscultation. Respiratory effort normal. Cardiovascular system: S1 & S2 heard, RRR. No JVD, murmurs, rubs, gallops or clicks. No pedal edema. Gastrointestinal system: Abdomen is nondistended, soft and nontender. No organomegaly or masses felt. Normal bowel sounds heard. Central nervous system: Alert and oriented. No focal neurological deficits. Extremities: Left BKA Skin: Right foot is wrapped in dressings. Psychiatry: Judgement and insight appear normal. Mood & affect appropriate.     Data Reviewed: I have personally reviewed following labs and imaging studies  CBC: Recent Labs  Lab 11/04/19 2226 11/06/19 0517  WBC 10.8* 6.5  NEUTROABS 9.1* 4.5  HGB 10.0* 7.9*  HCT 31.4* 25.1*  MCV 93.7 95.8  PLT 428* 398   Basic Metabolic Panel: Recent Labs  Lab 11/04/19 2226 11/05/19 0649 11/06/19 0517  NA 126* 132* 129*  K 3.6 3.7 3.6  CL 90* 95* 93*  CO2 GLUCOSE 284* 240* 220*  BUN CREATININE 0.73 0.65 0.64  CALCIUM  7.8* 7.8* 7.6*  MG 1.4*  --   --   PHOS 2.8  --   --    GFR: Estimated Creatinine Clearance: 128.1 mL/min (by C-G formula based on SCr of 0.64 mg/dL). Liver Function Tests: Recent Labs  Lab 11/04/19 2226  AST 18  ALT 17  ALKPHOS 106  BILITOT 0.9  PROT 6.4*  ALBUMIN 1.8*   No results for input(s): LIPASE, AMYLASE in the last 168 hours. No results for input(s): AMMONIA in the last 168 hours. Coagulation Profile: No results for input(s): INR, PROTIME in the last 168 hours. Cardiac Enzymes: No results for input(s): CKTOTAL, CKMB, CKMBINDEX, TROPONINI in the last 168 hours. BNP (last 3 results) No results for input(s): PROBNP in the last 8760 hours. HbA1C: No results for input(s): HGBA1C in the last 72 hours. CBG: Recent Labs  Lab 11/05/19 1627 11/05/19 2136 11/06/19 0739 11/06/19 1141 11/06/19 1705  GLUCAP 237* 250* 169* 204* 189*   Lipid Profile: No results for input(s): CHOL, HDL, LDLCALC, TRIG, CHOLHDL, LDLDIRECT in the last 72 hours. Thyroid Function Tests: No results for input(s): TSH, T4TOTAL, FREET4, T3FREE, THYROIDAB in the last 72 hours. Anemia Panel: No results for input(s): VITAMINB12, FOLATE, FERRITIN, TIBC, IRON, RETICCTPCT in the last 72 hours. Sepsis Labs: No results for input(s): PROCALCITON, LATICACIDVEN in the last 168 hours.  Recent Results (from the past 240 hour(s))  Culture, blood (routine x 2)     Status: None (Preliminary result)   Collection Time: 11/04/19 10:21 PM   Specimen: BLOOD RIGHT ARM  Result Value Ref Range Status   Specimen Description BLOOD RIGHT ARM  Final   Special Requests   Final    BOTTLES DRAWN AEROBIC AND ANAEROBIC Blood Culture adequate volume   Culture   Final    NO GROWTH 2 DAYS Performed at [facility], [address], [place], [place] [address]    Report Status PENDING  Incomplete  Culture, blood (routine x 2)     Status: None (Preliminary result)   Collection Time: 11/04/19 10:26 PM   Specimen: BLOOD RIGHT HAND    Result Value Ref Range Status   Specimen Description BLOOD RIGHT HAND  Final   Special Requests   Final    BOTTLES DRAWN AEROBIC AND ANAEROBIC Blood Culture adequate volume   Culture   Final    NO GROWTH 2 DAYS Performed at [facility], [address] [place], [place] [address]    Report Status PENDING  Incomplete  Respiratory Panel by RT PCR (Flu A&B, Covid) - Nasopharyngeal Swab     Status: None   Collection Time: 11/05/19  9:20 AM   Specimen: Nasopharyngeal Swab  Result Value Ref Range Status   SARS Coronavirus 2 by RT PCR NEGATIVE NEGATIVE Final    Comment: (NOTE) SARS-CoV-2 target nucleic acids are NOT DETECTED. The SARS-CoV-2 RNA is generally detectable in upper respiratoy specimens during the acute phase of infection. The lowest concentration of SARS-CoV-2 viral copies this assay can detect is 131  copies/mL. A negative result does not preclude SARS-Cov-2 infection and should not be used as the sole basis for treatment or other patient management decisions. A negative result may occur with  improper specimen collection/handling, submission of specimen other than nasopharyngeal swab, presence of viral mutation(s) within the areas targeted by this assay, and inadequate number of viral copies (<131 copies/mL). A negative result must be combined with clinical observations, patient history, and epidemiological information. The expected result is Negative. Fact Sheet for Patients:  https://www.moore.com/ Fact Sheet for Healthcare Providers:  https://www.young.biz/ This test is not yet ap proved or cleared by the [place] FDA and  has been authorized for detection and/or diagnosis of SARS-CoV-2 by FDA under an Emergency Use Authorization (EUA). This EUA will remain  in effect (meaning this test can be used) for the duration of the COVID-19 declaration under Section 564(b)(1) of the Act, 21 U.S.C. section 360bbb-3(b)(1), unless the  authorization is terminated or revoked sooner.    Influenza A by PCR NEGATIVE NEGATIVE Final   Influenza B by PCR NEGATIVE NEGATIVE Final    Comment: (NOTE) The Xpert Xpress SARS-CoV-2/FLU/RSV assay is intended as an aid in  the diagnosis of influenza from Nasopharyngeal swab specimens and  should not be used as a sole basis for treatment. Nasal washings and  aspirates are unacceptable for Xpert Xpress SARS-CoV-2/FLU/RSV  testing. Fact Sheet for Patients: https://www.moore.com/ Fact Sheet for Healthcare Providers: https://www.young.biz/ This test is not yet approved or cleared by the [place] FDA and  has been authorized for detection and/or diagnosis of SARS-CoV-2 by  FDA under an Emergency Use Authorization (EUA). This EUA will remain  in effect (meaning this test can be used) for the duration of the  Covid-19 declaration under Section 564(b)(1) of the Act, 21  U.S.C. section 360bbb-3(b)(1), unless the authorization is  terminated or revoked. Performed at [facility], [address] [place], [place] [address]   Aerobic/Anaerobic Culture (surgical/deep wound)     Status: None (Preliminary result)   Collection Time: 11/05/19  1:34 PM   Specimen: Soft Tissue, Other  Result Value Ref Range Status   Specimen Description   Final    BONE RIGHT FIBULA BX Performed at [facility], [address] [place], [place] [address]    Special Requests   Final    VANCOMYCIN Performed at [facility], [address], [place], [place] [address]    Gram Stain   Final    MODERATE WBC PRESENT,BOTH PMN AND MONONUCLEAR FEW GRAM POSITIVE COCCI    Culture   Final    CULTURE REINCUBATED FOR BETTER GROWTH Performed at [facility], [address], [place], [place] [address]    Report Status PENDING  Incomplete  Aerobic/Anaerobic Culture (surgical/deep wound)     Status: None (Preliminary result)   Collection Time: 11/05/19  1:37 PM   Specimen: Soft  Tissue, Other  Result Value Ref Range Status   Specimen Description   Final    TISSUE RIGHT ANKLE Performed at [facility], [address], [place], [place] [address]    Special Requests   Final    VANCOMYCIN Performed at [facility], [address] [place], [place] [address]    Gram Stain   Final    FEW WBC PRESENT,BOTH PMN AND MONONUCLEAR ABUNDANT GRAM POSITIVE COCCI    Culture   Final    CULTURE REINCUBATED FOR BETTER GROWTH Performed at [facility], [address], [place], [place] [address]  Report Status PENDING  Incomplete         Radiology Studies: DG Ankle Complete Right  Result Date: 11/05/2019 CLINICAL DATA:  Recent ankle surgery with increased swelling and redness EXAM: RIGHT ANKLE - COMPLETE 3+ VIEW COMPARISON:  10/22/2019, 06/19/2016 FINDINGS: Increased soft tissue swelling compared to prior radiograph, now with small foci of gas in the medial soft tissues. Irregular arthropathy at the tibial talar joint with bony destructive changes, grossly similar compared to prior radiograph. Multiple bony fragments anterior and posterior to the ankle joint. Mild periosteal reaction at the distal tibia without change. Pes planus deformity. 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 In the interim, there is increased soft tissue swelling about the ankle with new small focus of gas in the medial soft tissues either due to recent surgery or acute infection. Electronically Signed   By: Donavan Foil M.D.   On: 11/05/2019 03:14   MR FOOT RIGHT W WO CONTRAST  Result Date: 11/05/2019 CLINICAL DATA:  Diabetic patient with skin ulcerations on the right foot and ankle. EXAM: MRI OF THE RIGHT ANKLE WITHOUT AND WITH CONTRAST; MRI OF THE RIGHT FOREFOOT WITHOUT AND WITH CONTRAST TECHNIQUE: Multiplanar, multisequence MR imaging of the right ankle and  forefoot was performed both before and after administration of intravenous contrast. CONTRAST:  9 mL GADAVIST IV COMPARISON:  Plain films right foot 10/24/2019 and 04/11/2017. Plain films right foot and ankle 10/22/2019. MRI right forefoot 04/11/2017. FINDINGS: Bones/Joint/Cartilage As seen on the most recent comparison plain films, there is bony destructive change about the tibiotalar joint. Intense marrow edema, periosteal reaction, postcontrast enhancement and decreased T1 signal are seen in the visualized distal tibia and fibula extending at least 10 cm above the joint. There is also intense edema and enhancement throughout the calcaneus, talus and navicular. The patient has a large tibiotalar joint effusion with marked rim enhancement. Fluid extends out of the medial and lateral margins of the joint. On the lateral side of the joint, on the lateral side of the joint, fluid measures approximately 7 cm AP x 1 cm transverse x 7.5 cm craniocaudal. On the medial side, fluid measures 5.1 cm AP x 1.1 cm transverse x 4 cm craniocaudal. Air is seen within the fluid collection. No other evidence of osteomyelitis is seen in the ankle or foot. The patient is status post amputation of the first, third and fifth toes. Ligaments The anterior talofibular ligament and calcaneofibular ligament are torn. There is also likely at least partial tearing of the deltoid ligament. Muscles and Tendons Intact. A rim enhancing fluid collection in the distal flexor hallucis longus muscle belly measures 1.6 cm AP by 2 cm transverse by 1.5 cm craniocaudal. A gas containing fluid collection measuring 1.5 cm AP x 1 cm transverse by 1.3 cm craniocaudal is seen anterior to the tibialis posterior tendon approximately 4 cm above the tibiotalar joint consistent with abscess. Soft tissues Intense subcutaneous edema and enhancement are present about the ankle. IMPRESSION: Findings consistent with a septic tibiotalar joint and extensive osteomyelitis  throughout the visualized distal tibia, fibula, calcaneus and talus. Extensive abscess formation about the joint is identified and there is marked cellulitis present. Complete tears of the ATFL and calcaneofibular ligaments. The deltoid ligament is also likely completely torn. Status post amputation of the first, third and fifth toes. Electronically Signed   By: Inge Rise M.D.   On: 11/05/2019 11:45  MR ANKLE RIGHT W WO CONTRAST  Result Date: 11/05/2019 CLINICAL DATA:  Diabetic patient with skin ulcerations on the right foot and ankle. EXAM: MRI OF THE RIGHT ANKLE WITHOUT AND WITH CONTRAST; MRI OF THE RIGHT FOREFOOT WITHOUT AND WITH CONTRAST TECHNIQUE: Multiplanar, multisequence MR imaging of the right ankle and forefoot was performed both before and after administration of intravenous contrast. CONTRAST:  9 mL GADAVIST IV COMPARISON:  Plain films right foot 10/24/2019 and 04/11/2017. Plain films right foot and ankle 10/22/2019. MRI right forefoot 04/11/2017. FINDINGS: Bones/Joint/Cartilage As seen on the most recent comparison plain films, there is bony destructive change about the tibiotalar joint. Intense marrow edema, periosteal reaction, postcontrast enhancement and decreased T1 signal are seen in the visualized distal tibia and fibula extending at least 10 cm above the joint. There is also intense edema and enhancement throughout the calcaneus, talus and navicular. The patient has a large tibiotalar joint effusion with marked rim enhancement. Fluid extends out of the medial and lateral margins of the joint. On the lateral side of the joint, on the lateral side of the joint, fluid measures approximately 7 cm AP x 1 cm transverse x 7.5 cm craniocaudal. On the medial side, fluid measures 5.1 cm AP x 1.1 cm transverse x 4 cm craniocaudal. Air is seen within the fluid collection. No other evidence of osteomyelitis is seen in the ankle or foot. The patient is status post amputation of the first, third  and fifth toes. Ligaments The anterior talofibular ligament and calcaneofibular ligament are torn. There is also likely at least partial tearing of the deltoid ligament. Muscles and Tendons Intact. A rim enhancing fluid collection in the distal flexor hallucis longus muscle belly measures 1.6 cm AP by 2 cm transverse by 1.5 cm craniocaudal. A gas containing fluid collection measuring 1.5 cm AP x 1 cm transverse by 1.3 cm craniocaudal is seen anterior to the tibialis posterior tendon approximately 4 cm above the tibiotalar joint consistent with abscess. Soft tissues Intense subcutaneous edema and enhancement are present about the ankle. IMPRESSION: Findings consistent with a septic tibiotalar joint and extensive osteomyelitis throughout the visualized distal tibia, fibula, calcaneus and talus. Extensive abscess formation about the joint is identified and there is marked cellulitis present. Complete tears of the ATFL and calcaneofibular ligaments. The deltoid ligament is also likely completely torn. Status post amputation of the first, third and fifth toes. Electronically Signed   By: Drusilla Kanner M.D.   On: 11/05/2019 11:45        Scheduled Meds: . amiodarone  200 mg Oral BID  . aspirin EC  81 mg Oral Daily  . atorvastatin  20 mg Oral Daily  . diltiazem  120 mg Oral Daily  . DULoxetine  60 mg Oral Daily  . gabapentin  600 mg Oral QHS  . insulin aspart  0-5 Units Subcutaneous QHS  . insulin aspart  0-9 Units Subcutaneous TID WC  . insulin glargine  20 Units Subcutaneous QHS   Continuous Infusions: . vancomycin Stopped (11/06/19 1400)     LOS: 2 days    Time spent:    Erick Blinks, MD [facility]   If 7PM-7AM, please contact night-coverage [email or web address]  11/06/2019, 7:59 PM

## 2019-11-06 NOTE — Plan of Care (Signed)

## 2019-11-07 LAB — CBC
HCT: 25.5 % — ABNORMAL LOW (ref 39.0–52.0)
Hemoglobin: 8.1 g/dL — ABNORMAL LOW (ref 13.0–17.0)
MCH: 30.2 pg (ref 26.0–34.0)
MCHC: 31.8 g/dL (ref 30.0–36.0)
MCV: 95.1 fL (ref 80.0–100.0)
Platelets: 382 10*3/uL (ref 150–400)
RBC: 2.68 MIL/uL — ABNORMAL LOW (ref 4.22–5.81)
RDW: 12.7 % (ref 11.5–15.5)
WBC: 5.7 10*3/uL (ref 4.0–10.5)
nRBC: 0 % (ref 0.0–0.2)

## 2019-11-07 LAB — GLUCOSE, CAPILLARY
Glucose-Capillary: 166 mg/dL — ABNORMAL HIGH (ref 70–99)
Glucose-Capillary: 211 mg/dL — ABNORMAL HIGH (ref 70–99)
Glucose-Capillary: 211 mg/dL — ABNORMAL HIGH (ref 70–99)
Glucose-Capillary: 278 mg/dL — ABNORMAL HIGH (ref 70–99)

## 2019-11-07 LAB — BASIC METABOLIC PANEL
Anion gap: 7 (ref 5–15)
BUN: 7 mg/dL (ref 6–20)
CO2: 28 mmol/L (ref 22–32)
Calcium: 7.6 mg/dL — ABNORMAL LOW (ref 8.9–10.3)
Chloride: 94 mmol/L — ABNORMAL LOW (ref 98–111)
Creatinine, Ser: 0.67 mg/dL (ref 0.61–1.24)
GFR calc Af Amer: >60 mL/min (ref >60)
GFR calc non Af Amer: >60 mL/min (ref >60)
Glucose, Bld: 207 mg/dL — ABNORMAL HIGH (ref 70–99)
Potassium: 3.7 mmol/L (ref 3.5–5.1)
Sodium: 129 mmol/L — ABNORMAL LOW (ref 135–145)

## 2019-11-07 MED ORDER — FUROSEMIDE 10 MG/ML IJ SOLN
20.0000 mg | Freq: Once | INTRAMUSCULAR | Status: AC
  Administered 2019-11-07: 20 mg via INTRAVENOUS
  Filled 2019-11-07: qty 2

## 2019-11-07 MED ORDER — POLYETHYLENE GLYCOL 3350 17 G PO PACK
17.0000 g | PACK | Freq: Every day | ORAL | Status: DC
  Administered 2019-11-07: 17 g via ORAL
  Filled 2019-11-07 (×2): qty 1

## 2019-11-07 MED ORDER — APIXABAN 5 MG PO TABS
5.0000 mg | ORAL_TABLET | Freq: Two times a day (BID) | ORAL | Status: DC
  Administered 2019-11-07 – 2019-11-08 (×3): 5 mg via ORAL
  Filled 2019-11-07 (×3): qty 1

## 2019-11-07 NOTE — Care Management Important Message (Signed)
Important Message  Patient Details  Name: [name] MRN: [id number] Date of Birth: 1965/11/09   Medicare Important Message Given:  Yes(RN will deliver to patient)     Corey Harold 11/07/2019, 3:01 PM

## 2019-11-07 NOTE — TOC Progression Note (Signed)
Transition of Care [facility]) - Progression Note    Patient Details  Name: [name] MRN: [id number] Date of Birth: 1966-07-29  Transition of [facility]) CM/SW Contact  Leitha Bleak, RN Phone Number: 11/07/2019, 4:20 PM  Clinical Narrative:   Discharge planning for patient to go home on Saturday.  OPAT orders are in for IV antibiotics. [facility] with [facility] and [facility] with [facility] are both aware of discharge plan.  Wound VAC has been delivered to the room.   Expected Discharge Plan: Home w Home Health Services Barriers to Discharge: Continued Medical Work up  Expected Discharge Plan and Services Expected Discharge Plan: Home w Home Health Services                DME Arranged: (wound VAC) DME Agency: [employer] Date DME Agency Contacted: 11/06/19 Time DME Agency Contacted: [phone] Representative spoke with at DME Agency: Therisa Doyne    Readmission Risk Interventions No flowsheet data found.

## 2019-11-07 NOTE — Progress Notes (Signed)
PHARMACY CONSULT NOTE FOR:  OUTPATIENT  PARENTERAL ANTIBIOTIC THERAPY (OPAT)  Indication: cellulitis Regimen: Vancomycin 1500mg  iv q12h  End date: 12/18/19 (6 weeks from 11/06/19)  IV antibiotic discharge orders are pended. To discharging provider:  please sign these orders via discharge navigator,  Select New Orders & click on the button choice - Manage This Unsigned Work.     Thank you for allowing pharmacy to be a part of this patient's care.  11/08/19 [name] 11/07/2019, 12:47 PM

## 2019-11-07 NOTE — Progress Notes (Addendum)
PROGRESS NOTE    [name]  ONG:[id number] DOB: [birth year] DOA: 11/04/2019 PCP: Dettinger, [name], MD    Brief Narrative:  54 year old male with history of hypertension, atrial fibrillation, recent DVT, diabetes, left BKA, most recently to hospital for partial ray amputation.  At that time, he was found to have sepsis with bacteremia and was discharged home on intravenous antibiotics.  On follow-up with his podiatrist, he was noted to have significant cellulitis and abscess of the right ankle.  He was admitted for further intervention.   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  Status post incision and drainage.  Follow-up cultures, initial Gram stain shows gram-positive cocci.  Currently on on vancomycin since he had a reaction to Ancef as an outpatient.  Case was reviewed by Dr. [name] with infectious disease who recommended 6 weeks of vancomycin.  Pharmacy consult for OPAT has been entered.  Patient had wound VAC applied today.  Ultimately, he may need further surgical procedures, but this will be followed up by Dr. [name] in the outpatient setting. DVT.  Recent right popliteal DVT.  No signs of bleeding.  Hemoglobin stable.  Resume on Eliquis. Anemia.  Possibly related to acute blood loss postoperatively in the setting of recent anticoagulation.  There may also be an element of hemodilution from IV fluids.  Hemoglobin has been stable.  No signs of bleeding at this time. Diabetes.  Holding Metformin.  On sliding scale insulin and Lantus.  Blood sugar stable Paroxysmal Atrial fibrillation.  Continue amiodarone and diltiazem.  Anticoagulated with Eliquis Peripheral vascular disease.   Secondary to poorly controlled diabetes and hyperlipidemia.  Continue aspirin. Chronic pain.  Continue Norco and Flexeril. Hyperlipidemia.  Continue Lipitor Depression.  Continue Cymbalta.   DVT prophylaxis: Eliquis Code Status: Full code Family Communication: None present Disposition Plan: Possible discharge home in the next 24 hours if wound appears to be healing well   Consultants:  Podiatry  Procedures:  Incision and drainage of abscess, right ankle  Antimicrobials:  Vancomycin 12/30 >   Subjective: No significant bleeding.  No pain.  Objective: Vitals:   11/06/19 0526 11/06/19 2043 11/07/19 0534 11/07/19 0815  BP: 113/65 120/72 119/75   Pulse: 78 79 73   Resp: 16 20 18    Temp: 97.7 F (36.5 C) 98.4 F (36.9 C) 97.7 F (36.5 C)   TempSrc: Oral Oral Oral   SpO2: 97% 98% 100% 98%  Weight:      Height:        Intake/Output Summary (Last 24 hours) at 11/07/2019 1419 Last data filed at 11/07/2019 1346 Gross per 24 hour  Intake 945.83 ml  Output 2950 ml  Net -2004.17 ml   Filed Weights   11/04/19 1802  Weight: 90 kg    Examination:  General exam: Alert, awake, oriented x 3 Respiratory system: Clear to auscultation. Respiratory effort normal. Cardiovascular system:RRR. No murmurs, rubs, gallops. Gastrointestinal system: Abdomen is nondistended, soft and nontender. No organomegaly or masses felt. Normal bowel sounds heard. Central nervous system: Alert and oriented. No focal neurological deficits. Extremities: Right foot with dressing.  Right lower extremity has 1-2+ edema Skin: No rashes,  lesions or ulcers Psychiatry: Judgement and insight appear normal. Mood & affect appropriate.      Data Reviewed: I have personally reviewed following labs and imaging studies  CBC: Recent Labs  Lab 11/04/19 2226 11/06/19 0517 11/07/19 0547  WBC 10.8* 6.5 5.7  NEUTROABS 9.1* 4.5  --   HGB 10.0* 7.9* 8.1*  HCT 31.4* 25.1* 25.5*  MCV 93.7 95.8 95.1  PLT 428* 398  382   Basic Metabolic Panel: Recent Labs  Lab 11/04/19 2226 11/05/19 0649 11/06/19 0517 11/07/19 0547  NA 126* 132* 129* 129*  K 3.6 3.7 3.6 3.7  CL 90* 95* 93* 94*  CO2 27 28 28 28   GLUCOSE 284* 240* 220* 207*  BUN 8 7 8 7   CREATININE 0.73 0.65 0.64 0.67  CALCIUM 7.8* 7.8* 7.6* 7.6*  MG 1.4*  --   --   --   PHOS 2.8  --   --   --    GFR: Estimated Creatinine Clearance: 128.1 mL/min (by C-G formula based on SCr of 0.67 mg/dL). Liver Function Tests: Recent Labs  Lab 11/04/19 2226  AST 18  ALT 17  ALKPHOS 106  BILITOT 0.9  PROT 6.4*  ALBUMIN 1.8*   No results for input(s): LIPASE, AMYLASE in the last 168 hours. No results for input(s): AMMONIA in the last 168 hours. Coagulation Profile: No results for input(s): INR, PROTIME in the last 168 hours. Cardiac Enzymes: No results for input(s): CKTOTAL, CKMB, CKMBINDEX, TROPONINI in the last 168 hours. BNP (last 3 results) No results for input(s): PROBNP in the last 8760 hours. HbA1C: No results for input(s): HGBA1C in the last 72 hours. CBG: Recent Labs  Lab 11/06/19 1141 11/06/19 1705 11/06/19 2050 11/07/19 0805 11/07/19 1207  GLUCAP 204* 189* 247* 166* 211*   Lipid Profile: No results for input(s): CHOL, HDL, LDLCALC, TRIG, CHOLHDL, LDLDIRECT in the last 72 hours. Thyroid Function Tests: No results for input(s): TSH, T4TOTAL, FREET4, T3FREE, THYROIDAB in the last 72 hours. Anemia Panel: No results for input(s): VITAMINB12, FOLATE, FERRITIN, TIBC, IRON, RETICCTPCT in the last 72 hours. Sepsis Labs: No results for input(s): PROCALCITON, LATICACIDVEN in the last 168 hours.  Recent Results (from the past 240 hour(s))  Culture, blood (routine x 2)     Status: None (Preliminary result)   Collection Time: 11/04/19 10:21 PM   Specimen: BLOOD RIGHT ARM  Result Value Ref Range Status   Specimen Description BLOOD RIGHT ARM  Final   Special Requests   Final    BOTTLES DRAWN AEROBIC AND ANAEROBIC Blood Culture  adequate volume   Culture   Final    NO GROWTH 3 DAYS Performed at [facility], [address] [place], [address] [place]    Report Status PENDING  Incomplete  Culture, blood (routine x 2)     Status: None (Preliminary result)   Collection Time: 11/04/19 10:26 PM   Specimen: BLOOD RIGHT HAND  Result Value Ref Range Status   Specimen Description BLOOD RIGHT HAND  Final   Special Requests   Final    BOTTLES DRAWN AEROBIC AND ANAEROBIC Blood Culture adequate volume   Culture   Final    NO GROWTH 3 DAYS Performed at [facility], [address], [place], [address] [place]    Report Status PENDING  Incomplete  Respiratory Panel by RT PCR (Flu A&B, Covid) - Nasopharyngeal Swab     Status: None   Collection Time: 11/05/19  9:20 AM   Specimen: Nasopharyngeal Swab  Result Value  Ref Range Status   SARS Coronavirus 2 by RT PCR NEGATIVE NEGATIVE Final    Comment: (NOTE) SARS-CoV-2 target nucleic acids are NOT DETECTED. The SARS-CoV-2 RNA is generally detectable in upper respiratoy specimens during the acute phase of infection. The lowest concentration of SARS-CoV-2 viral copies this assay can detect is 131 copies/mL. A negative result does not preclude SARS-Cov-2 infection and should not be used as the sole basis for treatment or other patient management decisions. A negative result may occur with  improper specimen collection/handling, submission of specimen other than nasopharyngeal swab, presence of viral mutation(s) within the areas targeted by this assay, and inadequate number of viral copies (<131 copies/mL). A negative result must be combined with clinical observations, patient history, and epidemiological information. The expected result is Negative. Fact Sheet for Patients:  https://www.moore.com/ Fact Sheet for Healthcare Providers:  https://www.young.biz/ This test is not yet ap proved or cleared by the [place] FDA and  has been  authorized for detection and/or diagnosis of SARS-CoV-2 by FDA under an Emergency Use Authorization (EUA). This EUA will remain  in effect (meaning this test can be used) for the duration of the COVID-19 declaration under Section 564(b)(1) of the Act, 21 U.S.C. section 360bbb-3(b)(1), unless the authorization is terminated or revoked sooner.    Influenza A by PCR NEGATIVE NEGATIVE Final   Influenza B by PCR NEGATIVE NEGATIVE Final    Comment: (NOTE) The Xpert Xpress SARS-CoV-2/FLU/RSV assay is intended as an aid in  the diagnosis of influenza from Nasopharyngeal swab specimens and  should not be used as a sole basis for treatment. Nasal washings and  aspirates are unacceptable for Xpert Xpress SARS-CoV-2/FLU/RSV  testing. Fact Sheet for Patients: https://www.moore.com/ Fact Sheet for Healthcare Providers: https://www.young.biz/ This test is not yet approved or cleared by the [place] FDA and  has been authorized for detection and/or diagnosis of SARS-CoV-2 by  FDA under an Emergency Use Authorization (EUA). This EUA will remain  in effect (meaning this test can be used) for the duration of the  Covid-19 declaration under Section 564(b)(1) of the Act, 21  U.S.C. section 360bbb-3(b)(1), unless the authorization is  terminated or revoked. Performed at [facility], [address] [place], [place] [address]   Aerobic/Anaerobic Culture (surgical/deep wound)     Status: None (Preliminary result)   Collection Time: 11/05/19  1:34 PM   Specimen: Soft Tissue, Other  Result Value Ref Range Status   Specimen Description   Final    BONE RIGHT FIBULA BX Performed at [facility], [address], [place], [place] [address]    Special Requests   Final    VANCOMYCIN Performed at [facility], [address] [place], [place] [address]    Gram Stain   Final    MODERATE WBC PRESENT,BOTH PMN AND MONONUCLEAR FEW GRAM POSITIVE COCCI    Culture   Final      RARE BACILLUS SPECIES Standardized susceptibility testing for this organism is not available. CULTURE REINCUBATED FOR BETTER GROWTH Performed at [facility], [address], [place], [place] [address]    Report Status PENDING  Incomplete  Aerobic/Anaerobic Culture (surgical/deep wound)     Status: None (Preliminary result)   Collection Time: 11/05/19  1:37 PM   Specimen: Soft Tissue, Other  Result Value Ref Range Status   Specimen Description   Final    TISSUE RIGHT ANKLE Performed at [facility], [address], [place], [place] [address]    Special Requests  Final    VANCOMYCIN Performed at [facility], [address], [place], [place] [address]    Gram Stain   Final    FEW WBC PRESENT,BOTH PMN AND MONONUCLEAR ABUNDANT GRAM POSITIVE COCCI Performed at [facility], [address], [place], [place] [address]    Culture   Final    RARE STAPHYLOCOCCUS AUREUS NO ANAEROBES ISOLATED; CULTURE IN PROGRESS FOR 5 DAYS    Report Status PENDING  Incomplete         Radiology Studies: No results found.      Scheduled Meds:  amiodarone  200 mg Oral BID   apixaban  5 mg Oral BID   aspirin EC  81 mg Oral Daily   atorvastatin  20 mg Oral Daily   diltiazem  120 mg Oral Daily   DULoxetine  60 mg Oral Daily   gabapentin  600 mg Oral QHS   insulin aspart  0-5 Units Subcutaneous QHS   insulin aspart  0-9 Units Subcutaneous TID WC   insulin glargine  20 Units Subcutaneous QHS   polyethylene glycol  17 g Oral Daily   Continuous Infusions:  vancomycin 1,000 mg (11/07/19 1223)     LOS: 3 days    Time spent:    Erick Blinks, MD [facility]   If 7PM-7AM, please contact night-coverage [email or web address]  11/07/2019, 2:19 PM

## 2019-11-07 NOTE — Progress Notes (Signed)
Podiatry Progress Note  Subjective Feeling well.  No nausea, vomiting, fever, chills, shortness of breath or chest pain.  Objective Vital signs in last 24 hours:   Temp:  [97.7 F (36.5 C)-98.4 F (36.9 C)] 97.7 F (36.5 C) (01/01 0534) Pulse Rate:  [73-79] 73 (01/01 0534) Resp:  [18-20] 18 (01/01 0534) BP: (119-120)/(72-75) 119/75 (01/01 0534) SpO2:  [98 %-100 %] 98 % (01/01 0815)  GENERAL: Alert, NAD. DRESSING:  Intact with pale pink strikethrough. INTEGUMENT: Amputation site of the right third toe is open and packed with packing gauze.  Wound bed is pink-red and viable.  There are no areas of necrosis.  Minimal serosanguineous exudate.  Surgical wound along the medial aspect of the right ankle measures 3.0 x 1.0 x 2.5 cm.  There is 2.8 cm of undermining circumferentially from 12-7 o'clock.  The wound bed is pink-red and viable.  There are no areas of necrosis.  Moderate seropurulent exudate was encountered.  Surgical wound along the lateral aspect of the right ankle measures 7.5 x 3.5 x 3.0 cm.  The wound bed is pink-red and viable.  There are no areas of necrosis.  The fibula is visible.  Mild seropurulent exudate was encountered from the lateral wound.  No malodor is present.  Continued improvement in erythema and edema of the right ankle. VASCULAR: R DP pulse is palpable. R PT pulse is palpable.   Lab/Test Results  Recent Labs    11/06/19 0517 11/07/19 0547  WBC 6.5 5.7  HGB 7.9* 8.1*  HCT 25.1* 25.5*  PLT 398 382  NA 129* 129*  K 3.6 3.7  CL 93* 94*  CO2 28 28  BUN 8 7  CREATININE 0.64 0.67  GLUCOSE 220* 207*  CALCIUM 7.6* 7.6*    Recent Results (from the past 240 hour(s))  Culture, blood (routine x 2)     Status: None (Preliminary result)   Collection Time: 11/04/19 10:21 PM   Specimen: BLOOD RIGHT ARM  Result Value Ref Range Status   Specimen Description BLOOD RIGHT ARM  Final   Special Requests   Final    BOTTLES DRAWN AEROBIC AND ANAEROBIC Blood Culture  adequate volume   Culture   Final    NO GROWTH 3 DAYS Performed at [facility], [address] [place], [place] [address]    Report Status PENDING  Incomplete  Culture, blood (routine x 2)     Status: None (Preliminary result)   Collection Time: 11/04/19 10:26 PM   Specimen: BLOOD RIGHT HAND  Result Value Ref Range Status   Specimen Description BLOOD RIGHT HAND  Final   Special Requests   Final    BOTTLES DRAWN AEROBIC AND ANAEROBIC Blood Culture adequate volume   Culture   Final    NO GROWTH 3 DAYS Performed at [facility], [address], [place], [place] [address]    Report Status PENDING  Incomplete  Respiratory Panel by RT PCR (Flu A&B, Covid) - Nasopharyngeal Swab     Status: None   Collection Time: 11/05/19  9:20 AM   Specimen: Nasopharyngeal Swab  Result Value Ref Range Status   SARS Coronavirus 2 by RT PCR NEGATIVE NEGATIVE Final    Comment: (NOTE) SARS-CoV-2 target nucleic acids are NOT DETECTED. The SARS-CoV-2 RNA is generally detectable in upper respiratoy specimens during the acute phase of infection. The lowest concentration of SARS-CoV-2 viral copies this assay can detect is 131 copies/mL. A negative result does not preclude SARS-Cov-2 infection and should not  be used as the sole basis for treatment or other patient management decisions. A negative result may occur with  improper specimen collection/handling, submission of specimen other than nasopharyngeal swab, presence of viral mutation(s) within the areas targeted by this assay, and inadequate number of viral copies (<131 copies/mL). A negative result must be combined with clinical observations, patient history, and epidemiological information. The expected result is Negative. Fact Sheet for Patients:  PinkCheek.be Fact Sheet for Healthcare Providers:  GravelBags.it This test is not yet ap proved or cleared by the [place] FDA and  has been  authorized for detection and/or diagnosis of SARS-CoV-2 by FDA under an Emergency Use Authorization (EUA). This EUA will remain  in effect (meaning this test can be used) for the duration of the COVID-19 declaration under Section 564(b)(1) of the Act, 21 U.S.C. section 360bbb-3(b)(1), unless the authorization is terminated or revoked sooner.    Influenza A by PCR NEGATIVE NEGATIVE Final   Influenza B by PCR NEGATIVE NEGATIVE Final    Comment: (NOTE) The Xpert Xpress SARS-CoV-2/FLU/RSV assay is intended as an aid in  the diagnosis of influenza from Nasopharyngeal swab specimens and  should not be used as a sole basis for treatment. Nasal washings and  aspirates are unacceptable for Xpert Xpress SARS-CoV-2/FLU/RSV  testing. Fact Sheet for Patients: PinkCheek.be Fact Sheet for Healthcare Providers: GravelBags.it This test is not yet approved or cleared by the [place] FDA and  has been authorized for detection and/or diagnosis of SARS-CoV-2 by  FDA under an Emergency Use Authorization (EUA). This EUA will remain  in effect (meaning this test can be used) for the duration of the  Covid-19 declaration under Section 564(b)(1) of the Act, 21  U.S.C. section 360bbb-3(b)(1), unless the authorization is  terminated or revoked. Performed at [facility], [address] [place], [place] [address]   Aerobic/Anaerobic Culture (surgical/deep wound)     Status: None (Preliminary result)   Collection Time: 11/05/19  1:34 PM   Specimen: Soft Tissue, Other  Result Value Ref Range Status   Specimen Description   Final    BONE RIGHT FIBULA BX Performed at [facility], [address], [place], [place] [address]    Special Requests   Final    VANCOMYCIN Performed at [facility], [address], [place], [place] [address]    Gram Stain   Final    MODERATE WBC PRESENT,BOTH PMN AND MONONUCLEAR FEW GRAM POSITIVE COCCI    Culture   Final     CULTURE REINCUBATED FOR BETTER GROWTH Performed at [facility], [address], [place], [place] [address]    Report Status PENDING  Incomplete  Aerobic/Anaerobic Culture (surgical/deep wound)     Status: None (Preliminary result)   Collection Time: 11/05/19  1:37 PM   Specimen: Soft Tissue, Other  Result Value Ref Range Status   Specimen Description   Final    TISSUE RIGHT ANKLE Performed at [facility], [address], [place], [place] [address]    Special Requests   Final    VANCOMYCIN Performed at [facility], [address], [place], [place] [address]    Gram Stain   Final    FEW WBC PRESENT,BOTH PMN AND MONONUCLEAR ABUNDANT GRAM POSITIVE COCCI    Culture   Final    CULTURE REINCUBATED FOR BETTER GROWTH Performed at [facility], [place] [address], [place], [place] [address]    Report Status PENDING  Incomplete     No results  found.  Medications Scheduled Meds: . amiodarone  200 mg Oral BID  . aspirin EC  81 mg Oral Daily  . atorvastatin  20 mg Oral Daily  . diltiazem  120 mg Oral Daily  . DULoxetine  60 mg Oral Daily  . gabapentin  600 mg Oral QHS  . insulin aspart  0-5 Units Subcutaneous QHS  . insulin aspart  0-9 Units Subcutaneous TID WC  . insulin glargine  20 Units Subcutaneous QHS   Continuous Infusions: . vancomycin 1,000 mg (11/07/19 0340)   PRN Meds:.acetaminophen **OR** acetaminophen, albuterol, cyclobenzaprine, diphenhydrAMINE, gadobutrol, hydrALAZINE, HYDROcodone-acetaminophen, morphine injection, ondansetron **OR** ondansetron (ZOFRAN) IV  Assessment 1.  Postoperative day 2 status post I&D of right ankle abscess. 2.  Status post partial third ray amputation  3.  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 4.  Diabetes mellitus with peripheral neuropathy.  Plan The packing from the amputation site of the right third toe was removed.  The wound was irrigated with sterile saline.  The wound was repacked with iodoform gauze.  The  packing was removed from the medial and lateral ankle wounds.  Both wounds were palpated with some seropurulent exudate expressed.  Both wounds were compressed until no further exudate was encountered.  Both wounds were irrigated with copious amounts of sterile saline.  A negative pressure therapy dressing was applied to both ankle wounds and bridged to the anterior aspect of the ankle and connected to his home VAC unit.  The wound VAC was set at 125 mmHg of continuous pressure.  A gauze dressing was applied over the wound VAC dressing.  He tolerated the procedure well.  No local anesthesia was required.  I will change the wound VAC tomorrow given the seropurulence encountered today.  Case discussed with Dr. Kerry Hough.  If he is doing well tomorrow and both wounds show improvement then he may be discharged from my standpoint.  He will need home health for IV antibiotics.  I discussed this case with Dr. Algis Liming (ID) yesterday afternoon by phone.  We will proceed with a 6 week course of IV vancomycin with start date of 11/05/2019.  I will continue to follow his culture results to determine if there needs to be a change in his antibiotic therapy.  My office will also ensure that he has a follow-up with Dr. Orvan Falconer (ID) who was consulted during his prior admission.  He will also need home health for wound VAC dressing changes.  I have completed and signed the appropriate documentation for his home VAC unit.  [name] 11/07/2019, 11:08 AM

## 2019-11-08 LAB — CBC
HCT: 26.9 % — ABNORMAL LOW (ref 39.0–52.0)
Hemoglobin: 8.6 g/dL — ABNORMAL LOW (ref 13.0–17.0)
MCH: 30.1 pg (ref 26.0–34.0)
MCHC: 32 g/dL (ref 30.0–36.0)
MCV: 94.1 fL (ref 80.0–100.0)
Platelets: 377 10*3/uL (ref 150–400)
RBC: 2.86 MIL/uL — ABNORMAL LOW (ref 4.22–5.81)
RDW: 12.8 % (ref 11.5–15.5)
WBC: 4.8 10*3/uL (ref 4.0–10.5)
nRBC: 0 % (ref 0.0–0.2)

## 2019-11-08 LAB — GLUCOSE, CAPILLARY
Glucose-Capillary: 215 mg/dL — ABNORMAL HIGH (ref 70–99)
Glucose-Capillary: 232 mg/dL — ABNORMAL HIGH (ref 70–99)
Glucose-Capillary: 223 mg/dL — ABNORMAL HIGH (ref 70–99)

## 2019-11-08 LAB — BASIC METABOLIC PANEL
Anion gap: 8 (ref 5–15)
BUN: 8 mg/dL (ref 6–20)
CO2: 30 mmol/L (ref 22–32)
Calcium: 8.2 mg/dL — ABNORMAL LOW (ref 8.9–10.3)
Chloride: 94 mmol/L — ABNORMAL LOW (ref 98–111)
Creatinine, Ser: 0.6 mg/dL — ABNORMAL LOW (ref 0.61–1.24)
GFR calc Af Amer: >60 mL/min (ref >60)
GFR calc non Af Amer: >60 mL/min (ref >60)
Glucose, Bld: 223 mg/dL — ABNORMAL HIGH (ref 70–99)
Potassium: 4 mmol/L (ref 3.5–5.1)
Sodium: 132 mmol/L — ABNORMAL LOW (ref 135–145)

## 2019-11-08 LAB — VANCOMYCIN, TROUGH: Vancomycin Tr: 16 ug/mL (ref 15–20)

## 2019-11-08 MED ORDER — VANCOMYCIN IV (FOR PTA / DISCHARGE USE ONLY): 1500.0000 mg | Freq: Two times a day (BID) | INTRAVENOUS | 0 refills | Status: AC

## 2019-11-08 NOTE — Discharge Summary (Signed)
[name], is a 54 y.o. male  DOB [birth year]  MRN [id number].  Admission date:  11/04/2019  Admitting Physician  Waldemar Dickens, MD  Discharge Date:  11/08/2019   Primary MD  Dettinger, [name], MD  Recommendations for primary care physician for things to follow:   Continue to monitor clinically.   Admission Diagnosis  Cellulitis [L03.90]   Discharge Diagnosis  Cellulitis [L03.90]    Active Problems:   Osteomyelitis of toe of right foot (HCC)   Type 2 diabetes mellitus (HCC)   Cellulitis and abscess of foot   S/P BKA (below knee amputation) unilateral, left (HCC)   A-fib (HCC)   Acute deep vein thrombosis (DVT) of right popliteal vein ([place])   Cellulitis      Past Medical History:  Diagnosis Date  . Charcot ankle   . Hypertension   . Osteomyelitis of toe of right foot ([place])   . Recurrent boils   . Type 2 diabetes mellitus with diabetic neuropathy [facility])    Diagnosed age 84    Past Surgical History:  Procedure Laterality Date  . AMPUTATION Right 06/17/2014   Procedure: PARTIAL AMPUTATION RIGHT 3RD TOE;  Surgeon: Marcheta Grammes, DPM;  Location: [facility];  Service: Podiatry;  Laterality: Right;  . AMPUTATION Right 09/02/2014   Procedure: PARTIAL AMPUTATION 2ND TOE RIGHT FOOT;  Surgeon: Marcheta Grammes, DPM;  Location: [facility];  Service: Podiatry;  Laterality: Right;  . AMPUTATION Right 04/01/2015   Procedure: AMPUTATION DIGIT 1ST TOE RIGHT FOOT;  Surgeon: Juanita Laster, DPM;  Location: [facility];  Service: Podiatry;  Laterality: Right;  . AMPUTATION Right 06/29/2016   pinkeye   . AMPUTATION Right 10/24/2019   Procedure: PARTIAL THIRD RAY AMPUTATION;  Surgeon: [name], DPM;  Location: [facility];  Service: Podiatry;  Laterality: Right;  . FOOT AMPUTATION Left   . FOOT SURGERY Left 03/2017  . HERNIA REPAIR Bilateral    and 3rd hernia repair does not remember ehat side  .  IRRIGATION AND DEBRIDEMENT ABSCESS Right 11/05/2019   Procedure: IRRIGATION AND DRAINAGE ABSCESS RIGHT ANKLE;  Surgeon: [name], DPM;  Location: [facility];  Service: Podiatry;  Laterality: Right;  . TEE WITHOUT CARDIOVERSION N/A 10/29/2019   Procedure: TRANSESOPHAGEAL ECHOCARDIOGRAM (TEE) WITH PROPOFOL;  Surgeon: Fay Records, MD;  Location: [facility];  Service: Cardiovascular;  Laterality: N/A;       HPI  from the history and physical done on the day of admission:   Patient was admitted with significant cellulitis and abscess of the right ankle.  Please see initial history and physical examination for initial evaluation.     Hospital Course:        He was seen by podiatry, Dr. [name], and has had ongoing local treatment with debridement.  He was placed on intravenous vancomycin which should be continued at home.   Discharge Condition: Stable.  Follow UP  Follow-up Information    [facility].   Specialty: [place]  Why: RN       [facility] Follow up.   Why:   IV antibiotics Contact information: [phone]           Consults obtained -podiatry, Dr. [name].  Diet and Activity recommendation:  As advised  Discharge Instructions    Intravenous vancomycin to be monitored at home per pharmacy. Discharge Instructions    Diet - low sodium heart healthy   Complete by: As directed    Home infusion instructions   Complete by: As directed    Instructions: Flushing of vascular access device: 0.9% NaCl pre/post medication administration and prn patency; Heparin 100 u/ml, 44m for implanted ports and Heparin 10u/ml, 57mfor all other central venous catheters.   Increase activity slowly   Complete by: As directed         Discharge Medications     Allergies as of 11/08/2019   No Known Allergies     Medication List    STOP taking these medications   ceFAZolin  IVPB Commonly known as: ANCEF     metroNIDAZOLE 500 MG tablet Commonly known as: Flagyl   vancomycin 1,750 mg in sodium chloride 0.9 % 500 mL     TAKE these medications   albuterol 108 (90 Base) MCG/ACT inhaler Commonly known as: VENTOLIN HFA Inhale 2 puffs into the lungs every 6 (six) hours as needed for wheezing or shortness of breath.   amiodarone 200 MG tablet Commonly known as: PACERONE Take 1 tablet (200 mg total) by mouth 2 (two) times daily.   apixaban 5 MG Tabs tablet Commonly known as: ELIQUIS Take '10mg'$  po bid for 4 more days then '5mg'$  po bid What changed:   how much to take  how to take this  when to take this  additional instructions   aspirin EC 81 MG EC tablet Generic drug: aspirin Take 81 mg by mouth daily. Swallow whole.   atorvastatin 20 MG tablet Commonly known as: LIPITOR Take 1 tablet (20 mg total) by mouth daily.   cyclobenzaprine 10 MG tablet Commonly known as: FLEXERIL Take 1 tablet (10 mg total) by mouth at bedtime as needed for muscle spasms.   diltiazem 120 MG 24 hr capsule Commonly known as: CARDIZEM CD Take 1 capsule (120 mg total) by mouth daily.   diphenhydrAMINE 25 mg capsule Commonly known as: BENADRYL Take 25 mg by mouth See admin instructions. To be given prior to Vancomycin administration due to rash (allergic reaction)   DULoxetine 60 MG capsule Commonly known as: Cymbalta Take 1 capsule (60 mg total) by mouth daily.   fluticasone 50 MCG/ACT nasal spray Commonly known as: FLONASE Place 1 spray into both nostrils 2 (two) times daily as needed for allergies or rhinitis.   gabapentin 600 MG tablet Commonly known as: NEURONTIN Take 1 tablet (600 mg total) by mouth at bedtime.   HYDROcodone-acetaminophen 10-325 MG tablet Commonly known as: NORCO Take 1 tablet by mouth 2 (two) times daily as needed for moderate pain.   lisinopril 5 MG tablet Commonly known as: ZESTRIL Take 1 tablet (5 mg total) by mouth daily.   metFORMIN 1000 MG tablet Commonly known  as: GLUCOPHAGE Take 1 tablet (1,000 mg total) by mouth 2 (two) times daily.   multivitamin with minerals tablet Take 1 tablet by mouth daily.   terbinafine 1 % cream Commonly known as: LamISIL AT Apply 1 application topically 2 (two) times daily.   Toujeo Max SoloStar 300 UNIT/ML Sopn Generic drug: Insulin Glargine (2 Unit  Dial) Inject 40-50 Units into the skin daily.   vancomycin  IVPB Inject 1,500 mg into the vein every 12 (twelve) hours for 40 doses. Indication:  cellulitis Last Day of Therapy:  12/18/19 Labs - Sunday/Monday:  CBC/D, BMP, and vancomycin trough. Labs - Thursday:  BMP and vancomycin trough Labs - Every other week:  ESR and CRP   zinc gluconate 50 MG tablet Take 50 mg by mouth daily.            Home Infusion Instuctions  (From admission, onward)         Start     Ordered   11/08/19 0000  Home infusion instructions    Question:  Instructions  Answer:  Flushing of vascular access device: 0.9% NaCl pre/post medication administration and prn patency; Heparin 100 u/ml, 4m for implanted ports and Heparin 10u/ml, 525mfor all other central venous catheters.   11/08/19 0941          Major procedures and Radiology Reports - PLEASE review detailed and final reports for all details, in brief -      DG Tibia/Fibula Right  Result Date: 10/22/2019 CLINICAL DATA:  Right leg pain and tenderness. Fever. Clinical evidence of infection. Diabetes. EXAM: RIGHT TIBIA AND FIBULA - 2 VIEW COMPARISON:  Right ankle radiographs obtained at the same time. FINDINGS: Mild diffuse soft tissue swelling. Previously described chronic appearing periosteal reaction involving the distal tibia. On these radiographs, this is involving the medial and posterior aspects of the tibia. Also again noted are previously described bone collapse and fragmentation involving the talotibial joint and flattening of the normal plantar arch. No soft tissue gas is seen. Mild medial and lateral spur  formation at the knee joint with mild to moderate medial joint space narrowing. IMPRESSION: 1. Chronic appearing periosteal reaction involving the posterior and medial aspects of the distal tibia. Underlying chronic osteomyelitis is a possibility. 2. Diffuse soft tissue swelling without gas or bone destruction. 3. 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 Electronically Signed   By: StClaudie Revering.D.   On: 10/22/2019 16:05   DG Ankle Complete Right  Result Date: 11/05/2019 CLINICAL DATA:  Recent ankle surgery with increased swelling and redness EXAM: RIGHT ANKLE - COMPLETE 3+ VIEW COMPARISON:  10/22/2019, 06/19/2016 FINDINGS: Increased soft tissue swelling compared to prior radiograph, now with small foci of gas in the medial soft tissues. Irregular arthropathy at the tibial talar joint with bony destructive changes, grossly similar compared to prior radiograph. Multiple bony fragments anterior and posterior to the ankle joint. Mild periosteal reaction at the distal tibia without change. Pes planus deformity. 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 In the interim, there is increased soft tissue swelling about the ankle with new small focus of gas in the medial soft tissues either due to recent surgery or acute infection. Electronically Signed   By: KiDonavan Foil.D.   On: 11/05/2019 03:14   DG Ankle Complete Right  Result Date: 10/22/2019 CLINICAL DATA:  Right leg pain and tenderness and fever with clinical evidence of infection. Diabetes. EXAM: RIGHT ANKLE - COMPLETE 3+ VIEW COMPARISON:  Right foot radiographs obtained today and on 06/19/2016. FINDINGS: Diffuse soft tissue swelling. Bone collapse and fragmentation at the talotibial joint, described on the foot radiographs, in addition to previously described flattening of the normal plantar arch.  Again demonstrated are is chronic appearing posterior periosteal  reaction involving the distal tibia. No bone destruction or soft tissue gas seen. IMPRESSION: 1. Diffuse soft tissue swelling without underlying changes of acute osteomyelitis. 2. Neuropathic changes at the talotibial joint. 3. Pes planus. Electronically Signed   By: Claudie Revering M.D.   On: 10/22/2019 16:02   MR FOOT RIGHT W WO CONTRAST  Result Date: 11/05/2019 CLINICAL DATA:  Diabetic patient with skin ulcerations on the right foot and ankle. EXAM: MRI OF THE RIGHT ANKLE WITHOUT AND WITH CONTRAST; MRI OF THE RIGHT FOREFOOT WITHOUT AND WITH CONTRAST TECHNIQUE: Multiplanar, multisequence MR imaging of the right ankle and forefoot was performed both before and after administration of intravenous contrast. CONTRAST:  9 mL GADAVIST IV COMPARISON:  Plain films right foot 10/24/2019 and 04/11/2017. Plain films right foot and ankle 10/22/2019. MRI right forefoot 04/11/2017. FINDINGS: Bones/Joint/Cartilage As seen on the most recent comparison plain films, there is bony destructive change about the tibiotalar joint. Intense marrow edema, periosteal reaction, postcontrast enhancement and decreased T1 signal are seen in the visualized distal tibia and fibula extending at least 10 cm above the joint. There is also intense edema and enhancement throughout the calcaneus, talus and navicular. The patient has a large tibiotalar joint effusion with marked rim enhancement. Fluid extends out of the medial and lateral margins of the joint. On the lateral side of the joint, on the lateral side of the joint, fluid measures approximately 7 cm AP x 1 cm transverse x 7.5 cm craniocaudal. On the medial side, fluid measures 5.1 cm AP x 1.1 cm transverse x 4 cm craniocaudal. Air is seen within the fluid collection. No other evidence of osteomyelitis is seen in the ankle or foot. The patient is status post amputation of the first, third and fifth toes. Ligaments The anterior  talofibular ligament and calcaneofibular ligament are torn. There is also likely at least partial tearing of the deltoid ligament. Muscles and Tendons Intact. A rim enhancing fluid collection in the distal flexor hallucis longus muscle belly measures 1.6 cm AP by 2 cm transverse by 1.5 cm craniocaudal. A gas containing fluid collection measuring 1.5 cm AP x 1 cm transverse by 1.3 cm craniocaudal is seen anterior to the tibialis posterior tendon approximately 4 cm above the tibiotalar joint consistent with abscess. Soft tissues Intense subcutaneous edema and enhancement are present about the ankle. IMPRESSION: Findings consistent with a septic tibiotalar joint and extensive osteomyelitis throughout the visualized distal tibia, fibula, calcaneus and talus. Extensive abscess formation about the joint is identified and there is marked cellulitis present. Complete tears of the ATFL and calcaneofibular ligaments. The deltoid ligament is also likely completely torn. Status post amputation of the first, third and fifth toes. Electronically Signed   By: Inge Rise M.D.   On: 11/05/2019 11:45   MR FOOT RIGHT W WO CONTRAST  Result Date: 10/23/2019 CLINICAL DATA:  Nonhealing ulcer involving the third toe. History of prior amputations. EXAM: MRI OF THE RIGHT FOREFOOT WITHOUT AND WITH CONTRAST TECHNIQUE: Multiplanar, multisequence MR imaging of the right foot was performed before and after the administration of intravenous contrast. CONTRAST:  93m GADAVIST GADOBUTROL 1 MMOL/ML IV SOLN COMPARISON:  Radiographs 10/22/2019 FINDINGS: Abnormal T1 and T2 signal intensity in the proximal phalanx of the third toe and also in what appears to be a small residual portion of the middle phalanx. There is also associated abnormal contrast enhancement and surrounding cellulitis. Findings consistent with osteomyelitis. No other definite sites of acute osteomyelitis are identified. Evidence of prior amputations  involving the first and  fifth toes and parts of the other toes. The metatarsal bones are intact. No findings for septic arthritis involving the remaining MTP joints or involving the midfoot joint spaces. There is moderate edema like signal abnormality in the navicular bone along with some enhancement. Could not exclude osteomyelitis. Severe diffuse cellulitis involving the entire midfoot and forefoot. Mild diffuse myositis without findings for pyomyositis. IMPRESSION: 1. Osteomyelitis involving the third digit as detailed above. 2. Abnormal signal intensity and enhancement in the navicular bone. This is not completely covered but could not exclude osteomyelitis. 3. Severe diffuse cellulitis and mild diffuse myositis. Electronically Signed   By: Marijo Sanes M.D.   On: 10/23/2019 16:50   MR ANKLE RIGHT W WO CONTRAST  Result Date: 11/05/2019 CLINICAL DATA:  Diabetic patient with skin ulcerations on the right foot and ankle. EXAM: MRI OF THE RIGHT ANKLE WITHOUT AND WITH CONTRAST; MRI OF THE RIGHT FOREFOOT WITHOUT AND WITH CONTRAST TECHNIQUE: Multiplanar, multisequence MR imaging of the right ankle and forefoot was performed both before and after administration of intravenous contrast. CONTRAST:  9 mL GADAVIST IV COMPARISON:  Plain films right foot 10/24/2019 and 04/11/2017. Plain films right foot and ankle 10/22/2019. MRI right forefoot 04/11/2017. FINDINGS: Bones/Joint/Cartilage As seen on the most recent comparison plain films, there is bony destructive change about the tibiotalar joint. Intense marrow edema, periosteal reaction, postcontrast enhancement and decreased T1 signal are seen in the visualized distal tibia and fibula extending at least 10 cm above the joint. There is also intense edema and enhancement throughout the calcaneus, talus and navicular. The patient has a large tibiotalar joint effusion with marked rim enhancement. Fluid extends out of the medial and lateral margins of the joint. On the lateral side of the joint,  on the lateral side of the joint, fluid measures approximately 7 cm AP x 1 cm transverse x 7.5 cm craniocaudal. On the medial side, fluid measures 5.1 cm AP x 1.1 cm transverse x 4 cm craniocaudal. Air is seen within the fluid collection. No other evidence of osteomyelitis is seen in the ankle or foot. The patient is status post amputation of the first, third and fifth toes. Ligaments The anterior talofibular ligament and calcaneofibular ligament are torn. There is also likely at least partial tearing of the deltoid ligament. Muscles and Tendons Intact. A rim enhancing fluid collection in the distal flexor hallucis longus muscle belly measures 1.6 cm AP by 2 cm transverse by 1.5 cm craniocaudal. A gas containing fluid collection measuring 1.5 cm AP x 1 cm transverse by 1.3 cm craniocaudal is seen anterior to the tibialis posterior tendon approximately 4 cm above the tibiotalar joint consistent with abscess. Soft tissues Intense subcutaneous edema and enhancement are present about the ankle. IMPRESSION: Findings consistent with a septic tibiotalar joint and extensive osteomyelitis throughout the visualized distal tibia, fibula, calcaneus and talus. Extensive abscess formation about the joint is identified and there is marked cellulitis present. Complete tears of the ATFL and calcaneofibular ligaments. The deltoid ligament is also likely completely torn. Status post amputation of the first, third and fifth toes. Electronically Signed   By: Inge Rise M.D.   On: 11/05/2019 11:45   US Venous Img Lower Unilateral Right (DVT)  Result Date: 10/25/2019 CLINICAL DATA:  Right calf pain and edema. EXAM: RIGHT LOWER EXTREMITY VENOUS DOPPLER ULTRASOUND TECHNIQUE: Gray-scale sonography with graded compression, as well as color Doppler and duplex ultrasound were performed to evaluate the lower extremity deep venous systems  from the level of the common femoral vein and including the common femoral, femoral, profunda  femoral, popliteal and calf veins including the posterior tibial, peroneal and gastrocnemius veins when visible. The superficial great saphenous vein was also interrogated. Spectral Doppler was utilized to evaluate flow at rest and with distal augmentation maneuvers in the common femoral, femoral and popliteal veins. COMPARISON:  None. FINDINGS: Contralateral Common Femoral Vein: Respiratory phasicity is normal and symmetric with the symptomatic side. No evidence of thrombus. Normal compressibility. Common Femoral Vein: No evidence of thrombus. Normal compressibility, respiratory phasicity and response to augmentation. Saphenofemoral Junction: No evidence of thrombus. Normal compressibility and flow on color Doppler imaging. Profunda Femoral Vein: No evidence of thrombus. Normal compressibility and flow on color Doppler imaging. Femoral Vein: No evidence of thrombus. Normal compressibility, respiratory phasicity and response to augmentation. Popliteal Vein: Occlusive thrombus present in the right popliteal vein. Calf Veins: Posterior tibial vein normally patent. Visualized peroneal vein demonstrates some nonocclusive thrombus in the upper calf. Superficial Great Saphenous Vein: No evidence of thrombus. Normal compressibility. Venous Reflux:  None. Other Findings: No evidence of superficial thrombophlebitis or abnormal fluid collection. IMPRESSION: Positive for right lower extremity DVT with occlusive thrombus in the popliteal vein and nonocclusive thrombus in the peroneal vein. Electronically Signed   By: Aletta Edouard M.D.   On: 10/25/2019 14:21   US Venous Img Upper Uni Left (DVT)  Result Date: 10/29/2019 CLINICAL DATA:  Left upper extremity pain and swelling for the past 4 days. Prior history of lower extremity DVT EXAM: LEFT UPPER EXTREMITY VENOUS DOPPLER ULTRASOUND TECHNIQUE: Gray-scale sonography with graded compression, as well as color Doppler and duplex ultrasound were performed to evaluate the upper  extremity deep venous system from the level of the subclavian vein and including the jugular, axillary, basilic, radial, ulnar and upper cephalic vein. Spectral Doppler was utilized to evaluate flow at rest and with distal augmentation maneuvers. COMPARISON:  None. FINDINGS: Contralateral Subclavian Vein: Respiratory phasicity is normal and symmetric with the symptomatic side. No evidence of thrombus. Normal compressibility. Internal Jugular Vein: No evidence of thrombus. Normal compressibility, respiratory phasicity and response to augmentation. Subclavian Vein: No evidence of thrombus. Normal compressibility, respiratory phasicity and response to augmentation. Axillary Vein: No evidence of thrombus. Normal compressibility, respiratory phasicity and response to augmentation. Cephalic Vein: Abnormal and noncompressible. The lumen is expanded and filled with low-level internal echoes. Trace flow visible on color Doppler imaging. Findings are consistent with acute to subacute thrombus within the cephalic vein beginning in the proximal forearm and extending into the distal upper arm. Basilic Vein: No evidence of thrombus. Normal compressibility, respiratory phasicity and response to augmentation. Brachial Veins: No evidence of thrombus. Normal compressibility, respiratory phasicity and response to augmentation. Radial Veins: No evidence of thrombus. Normal compressibility, respiratory phasicity and response to augmentation. Ulnar Veins: No evidence of thrombus. Normal compressibility, respiratory phasicity and response to augmentation. Venous Reflux:  None visualized. Other Findings:  Superficial subcutaneous edema. IMPRESSION: 1. Positive for superficial venous thrombophlebitis involving the cephalic vein from the distal upper arm into the mid forearm. 2. No evidence of deep venous thrombosis in the left upper extremity. Electronically Signed   By: Jacqulynn Cadet M.D.   On: 10/29/2019 15:15   DG Chest Port 1  View  Result Date: 10/22/2019 CLINICAL DATA:  Fever EXAM: PORTABLE CHEST 1 VIEW COMPARISON:  12/09/2013 chest radiograph. FINDINGS: Stable cardiomediastinal silhouette with top-normal heart size. No pneumothorax. No pleural effusion. Lungs appear clear, with no acute consolidative  airspace disease and no pulmonary edema. IMPRESSION: No active disease. Electronically Signed   By: Ilona Sorrel M.D.   On: 10/22/2019 15:55   DG Foot Complete Right  Result Date: 10/24/2019 CLINICAL DATA:  Patient status post amputation of the right third metatarsal head and toe today. EXAM: RIGHT FOOT COMPLETE - 3+ VIEW COMPARISON:  Plain films the right foot 10/22/2019. FINDINGS: The right third toe and head and neck of the third metatarsal have been amputated. Surgical wound with packing noted. The patient is status post prior amputation of the great toe and distal phalanx of the second toe. 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 No acute abnormality. Electronically Signed   By: Inge Rise M.D.   On: 10/24/2019 15:33   DG Foot Complete Right  Result Date: 10/22/2019 CLINICAL DATA:  Right foot pain and fever. Diabetes. Clinical infection. EXAM: RIGHT FOOT COMPLETE - 3+ VIEW COMPARISON:  06/19/2016. FINDINGS: Diffuse soft tissue swelling. Again demonstrated surgical absence of the right great toe with interval surgical absence of the right little toe. Well-defined bone margins. No periosteal reaction or bone destruction involving the bones of the foot. There is flattening of the normal plantar arch and interval partial bone collapse and fragmentation involving the distal tibia and talus. There is also chronic appearing periosteal thickening and irregularity involving the distal tibia posteriorly. IMPRESSION: 1. Diffuse soft tissue swelling without soft tissue gas or evidence of underlying osteomyelitis. 2. Pes planus. 3. Neuropathic changes at the talotibial joint  with bone collapse and fragmentation. Electronically Signed   By: Claudie Revering M.D.   On: 10/22/2019 15:59   ECHOCARDIOGRAM COMPLETE  Result Date: 10/23/2019   ECHOCARDIOGRAM REPORT   Patient Name:   [name] Date of Exam: 10/23/2019 Medical Rec #:  [id number]     Height:       76.0 in Accession #:    [id number]    Weight:       206.8 lb Date of Birth:  11-15-1965    BSA:          2.25 m Patient Age:    75 years      BP:           99/54 mmHg Patient Gender: M             HR:           120 bpm. Exam Location:  [facility] Procedure: 2D Echo, Cardiac Doppler and Color Doppler Indications:    Atrial Fibrillation 427.31 / I48.91  History:        Patient has no prior history of Echocardiogram examinations.                 Arrythmias:Atrial Fibrillation; Risk Factors:Diabetes and                 Hypertension.  Sonographer:    Alvino Chapel RCS Referring Phys: [phone] DAVID TAT IMPRESSIONS  1. Left ventricular ejection fraction, by visual estimation, is 70 to 75%. The left ventricle has hyperdynamic function. There is mildly increased left ventricular hypertrophy.  2. Left ventricular diastolic parameters are indeterminate.  3. The left ventricle has no regional wall motion abnormalities.  4. Global right ventricle has normal systolic function.The right ventricular size is normal. No increase in right ventricular wall thickness.  5. Left atrial size was normal.  6. Right atrial size was normal.  7. The mitral valve is grossly normal. Trivial  mitral valve regurgitation.  8. The tricuspid valve is grossly normal. Tricuspid valve regurgitation is trivial.  9. The aortic valve is tricuspid. Aortic valve regurgitation is not visualized. 10. The pulmonic valve was grossly normal. Pulmonic valve regurgitation is not visualized. 11. Mildly elevated pulmonary artery systolic pressure. 12. The tricuspid regurgitant velocity is 3.04 m/s, and with an assumed right atrial pressure of 3 mmHg, the estimated right ventricular  systolic pressure is mildly elevated at 40.0 mmHg. 13. The inferior vena cava is normal in size with greater than 50% respiratory variability, suggesting right atrial pressure of 3 mmHg. FINDINGS  Left Ventricle: Left ventricular ejection fraction, by visual estimation, is 70 to 75%. The left ventricle has hyperdynamic function. The left ventricle has no regional wall motion abnormalities. The left ventricular internal cavity size was the left ventricle is normal in size. There is mildly increased left ventricular hypertrophy. Septal left ventricular hypertrophy. Left ventricular diastolic parameters are indeterminate. Right Ventricle: The right ventricular size is normal. No increase in right ventricular wall thickness. Global RV systolic function is has normal systolic function. The tricuspid regurgitant velocity is 3.04 m/s, and with an assumed right atrial pressure  of 3 mmHg, the estimated right ventricular systolic pressure is mildly elevated at 40.0 mmHg. Left Atrium: Left atrial size was normal in size. Right Atrium: Right atrial size was normal in size Pericardium: There is no evidence of pericardial effusion. Mitral Valve: The mitral valve is grossly normal. Trivial mitral valve regurgitation. Tricuspid Valve: The tricuspid valve is grossly normal. Tricuspid valve regurgitation is trivial. Aortic Valve: The aortic valve is tricuspid. Aortic valve regurgitation is not visualized. Pulmonic Valve: The pulmonic valve was grossly normal. Pulmonic valve regurgitation is not visualized. Pulmonic regurgitation is not visualized. Aorta: The aortic root is normal in size and structure. Venous: The inferior vena cava is normal in size with greater than 50% respiratory variability, suggesting right atrial pressure of 3 mmHg. IAS/Shunts: No atrial level shunt detected by color flow Doppler.  LEFT VENTRICLE PLAX 2D LVIDd:         4.44 cm LVIDs:         2.56 cm LV PW:         1.00 cm LV IVS:        1.26 cm LVOT diam:      2.00 cm LV SV:         66 ml LV SV Index:   29.33 LVOT Area:     3.14 cm  RIGHT VENTRICLE TAPSE (M-mode): 1.7 cm LEFT ATRIUM             Index       RIGHT ATRIUM           Index LA diam:        3.30 cm 1.47 cm/m  RA Area:     20.60 cm LA Vol (A2C):   52.0 ml 23.14 ml/m RA Volume:   57.20 ml  25.46 ml/m LA Vol (A4C):   40.9 ml 18.20 ml/m LA Biplane Vol: 47.2 ml 21.01 ml/m  AORTIC VALVE LVOT Vmax:   129.00 cm/s LVOT Vmean:  82.933 cm/s LVOT VTI:    0.185 m  AORTA Ao Root diam: 3.30 cm MITRAL VALVE                        TRICUSPID VALVE MV Area (PHT): 3.21 cm             TR Peak grad:  37.0 mmHg MV PHT:        68.44 msec           TR Vmax:        304.00 cm/s MV Decel Time: 236 msec MV E velocity: 107.00 cm/s 103 cm/s SHUNTS                                     Systemic VTI:  0.19 m                                     Systemic Diam: 2.00 cm  Rozann Lesches MD Electronically signed by Rozann Lesches MD Signature Date/Time: 10/23/2019/9:39:01 AM    Final    ECHO TEE  Result Date: 10/29/2019   TRANSESOPHOGEAL ECHO REPORT   Patient Name:   [name] Date of Exam: 10/29/2019 Medical Rec #:  [id number]     Height:       76.0 in Accession #:    [id number]    Weight:       223.5 lb Date of Birth:  12/19/1965    BSA:          2.32 m Patient Age:    44 years      BP:           134/74 mmHg Patient Gender: M             HR:           81 bpm. Exam Location:  [facility]  Procedure: Transesophageal Echo Indications:    Bacteremia  History:        Patient has prior history of Echocardiogram examinations, most                 recent 10/23/2019. S/P BKA (below knee amputation.  Sonographer:    Leavy Cella RDCS (AE) Referring Phys: [id number] Erma Heritage  PROCEDURE: The transesophogeal probe was passed through the esophogus of the patient. The patient developed no complications during the procedure. IMPRESSIONS  1. Left ventricular ejection fraction, by visual estimation, is 60 to 65%. The left ventricle has  normal function. There is no left ventricular hypertrophy.  2. The left ventricle has no regional wall motion abnormalities.  3. Global right ventricle has normal systolic function.The right ventricular size is normal. Right vetricular wall thickness was not assessed.  4. Left atrial size was normal.  5. Right atrial size was normal.  6. The mitral valve is normal in structure. No evidence of mitral valve regurgitation.  7. The tricuspid valve is normal in structure.  8. The aortic valve is normal in structure. Aortic valve regurgitation is not visualized.  9. The pulmonic valve was normal in structure. Pulmonic valve regurgitation is trivial. FINDINGS  Left Ventricle: Left ventricular ejection fraction, by visual estimation, is 60 to 65%. The left ventricle has normal function. The left ventricle has no regional wall motion abnormalities. There is no left ventricular hypertrophy. Right Ventricle: The right ventricular size is normal. Right vetricular wall thickness was not assessed. Global RV systolic function is has normal systolic function. Left Atrium: Left atrial size was normal in size. LA, LAA without masses. Right Atrium: Right atrial size was normal in size Pericardium: There is no evidence of pericardial effusion. Mitral Valve: The mitral valve is normal in structure. No evidence of  mitral valve regurgitation. Tricuspid Valve: The tricuspid valve is normal in structure. Tricuspid valve regurgitation is trivial. Aortic Valve: The aortic valve is normal in structure. Aortic valve regurgitation is not visualized. Pulmonic Valve: The pulmonic valve was normal in structure. Pulmonic valve regurgitation is trivial. Aorta: The aortic root is normal in size and structure. Shunts: Saline contrast bubble study was negative, with no evidence of any interatrial shunt. No atrial level shunt detected by color flow Doppler.  [name] MD Electronically signed by [name] MD Signature Date/Time: 10/29/2019/4:08:15 PM     Final    [place] EKG SITE RITE  Result Date: 10/29/2019 If Site Rite image not attached, placement could not be confirmed due to current cardiac rhythm.   Micro Results     Recent Results (from the past 240 hour(s))  Culture, blood (routine x 2)     Status: None (Preliminary result)   Collection Time: 11/04/19 10:21 PM   Specimen: BLOOD RIGHT ARM  Result Value Ref Range Status   Specimen Description BLOOD RIGHT ARM  Final   Special Requests   Final    BOTTLES DRAWN AEROBIC AND ANAEROBIC Blood Culture adequate volume   Culture   Final    NO GROWTH 4 DAYS Performed at [facility], [address] [place], [place] [address]    Report Status PENDING  Incomplete  Culture, blood (routine x 2)     Status: None (Preliminary result)   Collection Time: 11/04/19 10:26 PM   Specimen: BLOOD RIGHT HAND  Result Value Ref Range Status   Specimen Description BLOOD RIGHT HAND  Final   Special Requests   Final    BOTTLES DRAWN AEROBIC AND ANAEROBIC Blood Culture adequate volume   Culture   Final    NO GROWTH 4 DAYS Performed at [facility], [address], [place],  [address]    Report Status PENDING  Incomplete  Respiratory Panel by RT PCR (Flu A&B, Covid) - Nasopharyngeal Swab     Status: None   Collection Time: 11/05/19  9:20 AM   Specimen: Nasopharyngeal Swab  Result Value Ref Range Status   SARS Coronavirus 2 by RT PCR NEGATIVE NEGATIVE Final    Comment: (NOTE) SARS-CoV-2 target nucleic acids are NOT DETECTED. The SARS-CoV-2 RNA is generally detectable in upper respiratoy specimens during the acute phase of infection. The lowest concentration of SARS-CoV-2 viral copies this assay can detect is 131 copies/mL. A negative result does not preclude SARS-Cov-2 infection and should not be used as the sole basis for treatment or other patient management decisions. A negative result may occur with  improper specimen collection/handling, submission of specimen other than nasopharyngeal  swab, presence of viral mutation(s) within the areas targeted by this assay, and inadequate number of viral copies (<131 copies/mL). A negative result must be combined with clinical observations, patient history, and epidemiological information. The expected result is Negative. Fact Sheet for Patients:  PinkCheek.be Fact Sheet for Healthcare Providers:  GravelBags.it This test is not yet ap proved or cleared by the [place] FDA and  has been authorized for detection and/or diagnosis of SARS-CoV-2 by FDA under an Emergency Use Authorization (EUA). This EUA will remain  in effect (meaning this test can be used) for the duration of the COVID-19 declaration under Section 564(b)(1) of the Act, 21 U.S.C. section 360bbb-3(b)(1), unless the authorization is terminated or revoked sooner.    Influenza A by PCR NEGATIVE NEGATIVE Final   Influenza B by PCR NEGATIVE NEGATIVE Final  Comment: (NOTE) The Xpert Xpress SARS-CoV-2/FLU/RSV assay is intended as an aid in  the diagnosis of influenza from Nasopharyngeal swab specimens and  should not be used as a sole basis for treatment. Nasal washings and  aspirates are unacceptable for Xpert Xpress SARS-CoV-2/FLU/RSV  testing. Fact Sheet for Patients: PinkCheek.be Fact Sheet for Healthcare Providers: GravelBags.it This test is not yet approved or cleared by the [place] FDA and  has been authorized for detection and/or diagnosis of SARS-CoV-2 by  FDA under an Emergency Use Authorization (EUA). This EUA will remain  in effect (meaning this test can be used) for the duration of the  Covid-19 declaration under Section 564(b)(1) of the Act, 21  U.S.C. section 360bbb-3(b)(1), unless the authorization is  terminated or revoked. Performed at [facility], [address] [place], [place] [address]   Aerobic/Anaerobic Culture  (surgical/deep wound)     Status: None (Preliminary result)   Collection Time: 11/05/19  1:34 PM   Specimen: Soft Tissue, Other  Result Value Ref Range Status   Specimen Description   Final    BONE RIGHT FIBULA BX Performed at [facility], [address] [place], [place] [address]    Special Requests   Final    VANCOMYCIN Performed at [facility], [address], [place], [place] [address]    Gram Stain   Final    MODERATE WBC PRESENT,BOTH PMN AND MONONUCLEAR FEW GRAM POSITIVE COCCI    Culture   Final    RARE BACILLUS SPECIES Standardized susceptibility testing for this organism is not available. CULTURE REINCUBATED FOR BETTER GROWTH Performed at [facility], [place] [address] [place], [place] [address]    Report Status PENDING  Incomplete  Aerobic/Anaerobic Culture (surgical/deep wound)     Status: None (Preliminary result)   Collection Time: 11/05/19  1:37 PM   Specimen: Soft Tissue, Other  Result Value Ref Range Status   Specimen Description   Final    TISSUE RIGHT ANKLE Performed at [facility], [address], [place], [place] [address]    Special Requests   Final    VANCOMYCIN Performed at [facility], [address] [place], [place] [address]    Gram Stain   Final    FEW WBC PRESENT,BOTH PMN AND MONONUCLEAR ABUNDANT GRAM POSITIVE COCCI Performed at [facility], [place] [address], [place], [place] [address]    Culture   Final    RARE STAPHYLOCOCCUS AUREUS NO ANAEROBES ISOLATED; CULTURE IN PROGRESS FOR 5 DAYS    Report Status PENDING  Incomplete       Today   Subjective              Patient has been seen and examined prior to discharge   Objective   Blood pressure 131/79, pulse 78, temperature 98.1 F (36.7 C), temperature source Oral, resp. rate 16, height 6' 4"  (1.93 m), weight 90 kg, SpO2 98 %.   Intake/Output Summary (Last 24 hours) at 11/08/2019 0945 Last data filed at 11/08/2019 0804 Gross per 24 hour  Intake 554.03 ml  Output  4650 ml  Net -4095.97 ml    Exam Gen:- Awake Alert, no acute distress  HEENT:- [place].AT, No sclera icterus Neck-Supple Neck,No JVD,.  Lungs-  CTAB , good air movement bilaterally  CV- S1, S2 normal, regular Abd-  +ve B.Sounds, Abd Soft, No tenderness,    Extremity/Skin:- No  edema,   good pulses Psych-affect is appropriate, oriented x3 Neuro-no new focal deficits, no tremors  Data Review   CBC w Diff:  Lab Results  Component Value Date   WBC 4.8 11/08/2019   HGB 8.6 (L) 11/08/2019   HGB 16.4 07/22/2019   HCT 26.9 (L) 11/08/2019   HCT 48.3 07/22/2019   PLT 377 11/08/2019   PLT 222 07/22/2019   LYMPHOPCT 19 11/06/2019   MONOPCT 9 11/06/2019   EOSPCT 1 11/06/2019   BASOPCT 1 11/06/2019    CMP:  Lab Results  Component Value Date   NA 132 (L) 11/08/2019   NA 139 07/22/2019   K 4.0 11/08/2019   CL 94 (L) 11/08/2019   CO2 30 11/08/2019   BUN 8 11/08/2019   BUN 11 07/22/2019   CREATININE 0.60 (L) 11/08/2019   PROT 6.4 (L) 11/04/2019   PROT 6.8 07/22/2019   ALBUMIN 1.8 (L) 11/04/2019   ALBUMIN 4.4 07/22/2019   BILITOT 0.9 11/04/2019   BILITOT 0.8 07/22/2019   ALKPHOS 106 11/04/2019   AST 18 11/04/2019   ALT 17 11/04/2019  .   Total Discharge time is about 33 minutes  Doree Albee M.D on 11/08/2019 at 9:45 AM  Go to [email or web address] -  for contact info  [facility] - Office  [phone]

## 2019-11-08 NOTE — Progress Notes (Signed)
Podiatry Progress Note  Subjective Contacted this afternoon by patient.  Stated that Wound VAC was not functioning properly.    Objective Vital signs in last 24 hours:   Temp:  [98.1 F (36.7 C)-98.4 F (36.9 C)] 98.1 F (36.7 C) (01/02 1326) Pulse Rate:  [78-85] 81 (01/02 1326) Resp:  [16-20] 20 (01/02 1326) BP: (131-140)/(75-81) 132/81 (01/02 1326) SpO2:  [98 %-99 %] 98 % (01/02 1326)  GENERAL: Alert, NAD. DRESSING:  Intact with no strikethrough.  Wound VAC dressing intact and operational at 125 mmHg of continuous pressure.  No audible sound or warning.   Lab/Test Results  Recent Labs    11/07/19 0547 11/08/19 0604  WBC 5.7 4.8  HGB 8.1* 8.6*  HCT 25.5* 26.9*  PLT 382 377  NA 129* 132*  K 3.7 4.0  CL 94* 94*  CO2 28 30  BUN 7 8  CREATININE 0.67 0.60*  GLUCOSE 207* 223*  CALCIUM 7.6* 8.2*    Recent Results (from the past 240 hour(s))  Culture, blood (routine x 2)     Status: None (Preliminary result)   Collection Time: 11/04/19 10:21 PM   Specimen: BLOOD RIGHT ARM  Result Value Ref Range Status   Specimen Description BLOOD RIGHT ARM  Final   Special Requests   Final    BOTTLES DRAWN AEROBIC AND ANAEROBIC Blood Culture adequate volume   Culture   Final    NO GROWTH 4 DAYS Performed at [facility], [address] [place], [place] [address]    Report Status PENDING  Incomplete  Culture, blood (routine x 2)     Status: None (Preliminary result)   Collection Time: 11/04/19 10:26 PM   Specimen: BLOOD RIGHT HAND  Result Value Ref Range Status   Specimen Description BLOOD RIGHT HAND  Final   Special Requests   Final    BOTTLES DRAWN AEROBIC AND ANAEROBIC Blood Culture adequate volume   Culture   Final    NO GROWTH 4 DAYS Performed at [facility], [address], [place], [place] [address]    Report Status PENDING  Incomplete  Respiratory Panel by RT PCR (Flu A&B, Covid) - Nasopharyngeal Swab     Status: None   Collection Time: 11/05/19  9:20 AM   Specimen:  Nasopharyngeal Swab  Result Value Ref Range Status   SARS Coronavirus 2 by RT PCR NEGATIVE NEGATIVE Final    Comment: (NOTE) SARS-CoV-2 target nucleic acids are NOT DETECTED. The SARS-CoV-2 RNA is generally detectable in upper respiratoy specimens during the acute phase of infection. The lowest concentration of SARS-CoV-2 viral copies this assay can detect is 131 copies/mL. A negative result does not preclude SARS-Cov-2 infection and should not be used as the sole basis for treatment or other patient management decisions. A negative result may occur with  improper specimen collection/handling, submission of specimen other than nasopharyngeal swab, presence of viral mutation(s) within the areas targeted by this assay, and inadequate number of viral copies (<131 copies/mL). A negative result must be combined with clinical observations, patient history, and epidemiological information. The expected result is Negative. Fact Sheet for Patients:  https://www.moore.com/ Fact Sheet for Healthcare Providers:  https://www.young.biz/ This test is not yet ap proved or cleared by the [place] FDA and  has been authorized for detection and/or diagnosis of SARS-CoV-2 by FDA under an Emergency Use Authorization (EUA). This EUA will remain  in effect (meaning this test can be used) for the duration of the COVID-19 declaration under Section 564(b)(1) of  the Act, 21 U.S.C. section 360bbb-3(b)(1), unless the authorization is terminated or revoked sooner.    Influenza A by PCR NEGATIVE NEGATIVE Final   Influenza B by PCR NEGATIVE NEGATIVE Final    Comment: (NOTE) The Xpert Xpress SARS-CoV-2/FLU/RSV assay is intended as an aid in  the diagnosis of influenza from Nasopharyngeal swab specimens and  should not be used as a sole basis for treatment. Nasal washings and  aspirates are unacceptable for Xpert Xpress SARS-CoV-2/FLU/RSV  testing. Fact Sheet for  Patients: https://www.moore.com/ Fact Sheet for Healthcare Providers: https://www.young.biz/ This test is not yet approved or cleared by the [place] FDA and  has been authorized for detection and/or diagnosis of SARS-CoV-2 by  FDA under an Emergency Use Authorization (EUA). This EUA will remain  in effect (meaning this test can be used) for the duration of the  Covid-19 declaration under Section 564(b)(1) of the Act, 21  U.S.C. section 360bbb-3(b)(1), unless the authorization is  terminated or revoked. Performed at [facility], [address], [place], [place] [address]   Aerobic/Anaerobic Culture (surgical/deep wound)     Status: None (Preliminary result)   Collection Time: 11/05/19  1:34 PM   Specimen: Soft Tissue, Other  Result Value Ref Range Status   Specimen Description   Final    BONE RIGHT FIBULA BX Performed at [facility], [address], [place], [place] [address]    Special Requests   Final    VANCOMYCIN Performed at [facility], [address], [place], [place] [address]    Gram Stain   Final    MODERATE WBC PRESENT,BOTH PMN AND MONONUCLEAR FEW GRAM POSITIVE COCCI    Culture   Final    RARE BACILLUS SPECIES Standardized susceptibility testing for this organism is not available. RARE STAPHYLOCOCCUS AUREUS SUSCEPTIBILITIES TO FOLLOW Performed at [facility], [address], [place], [place] [address]    Report Status PENDING  Incomplete  Aerobic/Anaerobic Culture (surgical/deep wound)     Status: None (Preliminary result)   Collection Time: 11/05/19  1:37 PM   Specimen: Soft Tissue, Other  Result Value Ref Range Status   Specimen Description TISSUE RIGHT ANKLE  Final   Special Requests VANCOMYCIN  Final   Gram Stain   Final    FEW WBC PRESENT,BOTH PMN AND MONONUCLEAR ABUNDANT GRAM POSITIVE COCCI    Culture   Final    RARE STAPHYLOCOCCUS AUREUS NO ANAEROBES ISOLATED; CULTURE IN PROGRESS FOR 5 DAYS     Report Status PENDING  Incomplete   Organism ID, Bacteria STAPHYLOCOCCUS AUREUS  Final      Susceptibility   Staphylococcus aureus - MIC*    CIPROFLOXACIN >=8 RESISTANT Resistant     ERYTHROMYCIN >=8 RESISTANT Resistant     GENTAMICIN <=0.5 SENSITIVE Sensitive     OXACILLIN 0.5 SENSITIVE Sensitive     TETRACYCLINE >=16 RESISTANT Resistant     VANCOMYCIN <=0.5 SENSITIVE Sensitive     TRIMETH/SULFA <=10 SENSITIVE Sensitive     CLINDAMYCIN RESISTANT Resistant     RIFAMPIN <=0.5 SENSITIVE Sensitive     Inducible Clindamycin Value in next row Resistant      POSITIVEPerformed at [facility], [address], [place], [place] [address]    * RARE STAPHYLOCOCCUS AUREUS     No results found.  Medications Scheduled Meds: . amiodarone  200 mg Oral BID  . apixaban  5 mg Oral BID  . aspirin EC  81 mg Oral Daily  . atorvastatin  20 mg Oral  Daily  . diltiazem  120 mg Oral Daily  . DULoxetine  60 mg Oral Daily  . gabapentin  600 mg Oral QHS  . insulin aspart  0-5 Units Subcutaneous QHS  . insulin aspart  0-9 Units Subcutaneous TID WC  . insulin glargine  20 Units Subcutaneous QHS  . polyethylene glycol  17 g Oral Daily   Continuous Infusions: . vancomycin 1,000 mg (11/08/19 1438)   PRN Meds:.acetaminophen **OR** acetaminophen, albuterol, cyclobenzaprine, diphenhydrAMINE, gadobutrol, hydrALAZINE, HYDROcodone-acetaminophen, morphine injection, ondansetron **OR** ondansetron (ZOFRAN) IV  Assessment 1.  Postoperative day 3 status post I&D of right ankle abscess. 2.  Status post partial third ray amputation  3.  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 4.  Diabetes mellitus with peripheral neuropathy.  Plan Wound VAC dressing was removed.  A new negative pressure therapy dressing was applied to both ankle wounds and bridged to the anterior aspect of the ankle and connected to his home VAC unit.  The wound VAC was set at 125 mmHg of continuous pressure.  A gauze dressing was applied  over the wound VAC dressing.  He tolerated the procedure well.  No local anesthesia was required.    Brita Romp 11/08/2019, 4:08 PM

## 2019-11-08 NOTE — Progress Notes (Signed)
Nsg Discharge Note  Admit Date:  11/04/2019 Discharge date: 11/08/2019   [name] to be D/C'd Home per MD order.  AVS completed.  Copy for chart, and copy for patient signed, and dated. Patient/caregiver able to verbalize understanding.  Discharge Medication: Allergies as of 11/08/2019   No Known Allergies     Medication List    STOP taking these medications   ceFAZolin  IVPB Commonly known as: ANCEF   metroNIDAZOLE 500 MG tablet Commonly known as: Flagyl   vancomycin 1,750 mg in sodium chloride 0.9 % 500 mL     TAKE these medications   albuterol 108 (90 Base) MCG/ACT inhaler Commonly known as: VENTOLIN HFA Inhale 2 puffs into the lungs every 6 (six) hours as needed for wheezing or shortness of breath.   amiodarone 200 MG tablet Commonly known as: PACERONE Take 1 tablet (200 mg total) by mouth 2 (two) times daily.   apixaban 5 MG Tabs tablet Commonly known as: ELIQUIS Take '10mg'$  po bid for 4 more days then '5mg'$  po bid What changed:   how much to take  how to take this  when to take this  additional instructions   aspirin EC 81 MG EC tablet Generic drug: aspirin Take 81 mg by mouth daily. Swallow whole.   atorvastatin 20 MG tablet Commonly known as: LIPITOR Take 1 tablet (20 mg total) by mouth daily.   cyclobenzaprine 10 MG tablet Commonly known as: FLEXERIL Take 1 tablet (10 mg total) by mouth at bedtime as needed for muscle spasms.   diltiazem 120 MG 24 hr capsule Commonly known as: CARDIZEM CD Take 1 capsule (120 mg total) by mouth daily.   diphenhydrAMINE 25 mg capsule Commonly known as: BENADRYL Take 25 mg by mouth See admin instructions. To be given prior to Vancomycin administration due to rash (allergic reaction)   DULoxetine 60 MG capsule Commonly known as: Cymbalta Take 1 capsule (60 mg total) by mouth daily.   fluticasone 50 MCG/ACT nasal spray Commonly known as: FLONASE Place 1 spray into both nostrils 2 (two) times daily as needed  for allergies or rhinitis.   gabapentin 600 MG tablet Commonly known as: NEURONTIN Take 1 tablet (600 mg total) by mouth at bedtime.   HYDROcodone-acetaminophen 10-325 MG tablet Commonly known as: NORCO Take 1 tablet by mouth 2 (two) times daily as needed for moderate pain.   lisinopril 5 MG tablet Commonly known as: ZESTRIL Take 1 tablet (5 mg total) by mouth daily.   metFORMIN 1000 MG tablet Commonly known as: GLUCOPHAGE Take 1 tablet (1,000 mg total) by mouth 2 (two) times daily.   multivitamin with minerals tablet Take 1 tablet by mouth daily.   terbinafine 1 % cream Commonly known as: LamISIL AT Apply 1 application topically 2 (two) times daily.   Toujeo Max SoloStar 300 UNIT/ML Sopn Generic drug: Insulin Glargine (2 Unit Dial) Inject 40-50 Units into the skin daily.   vancomycin  IVPB Inject 1,500 mg into the vein every 12 (twelve) hours for 40 doses. Indication:  cellulitis Last Day of Therapy:  12/18/19 Labs - Sunday/Monday:  CBC/D, BMP, and vancomycin trough. Labs - Thursday:  BMP and vancomycin trough Labs - Every other week:  ESR and CRP   zinc gluconate 50 MG tablet Take 50 mg by mouth daily.            Home Infusion Instuctions  (From admission, onward)         Start     Ordered  11/08/19 0000  Home infusion instructions    Question:  Instructions  Answer:  Flushing of vascular access device: 0.9% NaCl pre/post medication administration and prn patency; Heparin 100 u/ml, 36m for implanted ports and Heparin 10u/ml, 576mfor all other central venous catheters.   11/08/19 0941          Discharge Assessment: Vitals:   11/08/19 0514 11/08/19 1326  BP: 131/79 132/81  Pulse: 78 81  Resp: 16 20  Temp: 98.1 F (36.7 C) 98.1 F (36.7 C)  SpO2: 98% 98%   Skin clean, dry and intact without evidence of skin break down, no evidence of skin tears noted. IV catheter discontinued intact. Site without signs and symptoms of complications - no redness or  edema noted at insertion site, patient denies c/o pain - only slight tenderness at site.  Dressing with slight pressure applied.  D/c Instructions-Education: Discharge instructions given to patient/family with verbalized understanding. D/c education completed with patient/family including follow up instructions, medication list, d/c activities limitations if indicated, with other d/c instructions as indicated by MD - patient able to verbalize understanding, all questions fully answered. Patient instructed to return to ED, call 911, or call MD for any changes in condition.  [place] [facility] [place] via private auto.  BuBerton BonRN 11/08/2019 6:26 PM

## 2019-11-08 NOTE — Progress Notes (Addendum)
Podiatry Progress Note  Subjective Feeling well.  No nausea, vomiting, fever, chills, shortness of breath or chest pain.  Objective Vital signs in last 24 hours:   Temp:  [98.1 F (36.7 C)-98.4 F (36.9 C)] 98.1 F (36.7 C) (01/02 0514) Pulse Rate:  [78-85] 78 (01/02 0514) Resp:  [16] 16 (01/02 0514) BP: (131-140)/(75-79) 131/79 (01/02 0514) SpO2:  [98 %-99 %] 98 % (01/02 0514)  GENERAL: Alert, NAD. DRESSING:  Intact with no strikethrough.  Wound VAC dressing intact and operational at 125 mmHg of continuous pressure. INTEGUMENT: Amputation site of the right third toe is open and packed with packing gauze.  Wound bed is pink-red and viable.  There are no areas of necrosis.  Minimal serosanguineous exudate.  Surgical wound along the medial aspect of the right ankle is stable.  The wound bed is pink-red and viable.  There are no areas of necrosis.  Mild to moderate seropurulent exudate was encountered.  Surgical wound along the lateral aspect of the right ankle is stable.  The wound bed is pink-red and viable.  There are no areas of necrosis.  The fibula is visible.  Mild serosanguineous exudate was encountered from the lateral wound.  No malodor is present.  Continued improvement in erythema and edema of the right ankle. VASCULAR: R DP pulse is palpable. R PT pulse is palpable.   Lab/Test Results  Recent Labs    11/07/19 0547 11/08/19 0604  WBC 5.7 4.8  HGB 8.1* 8.6*  HCT 25.5* 26.9*  PLT 382 377  NA 129* 132*  K 3.7 4.0  CL 94* 94*  CO2 28 30  BUN 7 8  CREATININE 0.67 0.60*  GLUCOSE 207* 223*  CALCIUM 7.6* 8.2*    Recent Results (from the past 240 hour(s))  Culture, blood (routine x 2)     Status: None (Preliminary result)   Collection Time: 11/04/19 10:21 PM   Specimen: BLOOD RIGHT ARM  Result Value Ref Range Status   Specimen Description BLOOD RIGHT ARM  Final   Special Requests   Final    BOTTLES DRAWN AEROBIC AND ANAEROBIC Blood Culture adequate volume   Culture    Final    NO GROWTH 4 DAYS Performed at [facility], [address], [place], [place] [address]    Report Status PENDING  Incomplete  Culture, blood (routine x 2)     Status: None (Preliminary result)   Collection Time: 11/04/19 10:26 PM   Specimen: BLOOD RIGHT HAND  Result Value Ref Range Status   Specimen Description BLOOD RIGHT HAND  Final   Special Requests   Final    BOTTLES DRAWN AEROBIC AND ANAEROBIC Blood Culture adequate volume   Culture   Final    NO GROWTH 4 DAYS Performed at [facility], [address], [place], [place] [address]    Report Status PENDING  Incomplete  Respiratory Panel by RT PCR (Flu A&B, Covid) - Nasopharyngeal Swab     Status: None   Collection Time: 11/05/19  9:20 AM   Specimen: Nasopharyngeal Swab  Result Value Ref Range Status   SARS Coronavirus 2 by RT PCR NEGATIVE NEGATIVE Final    Comment: (NOTE) SARS-CoV-2 target nucleic acids are NOT DETECTED. The SARS-CoV-2 RNA is generally detectable in upper respiratoy specimens during the acute phase of infection. The lowest concentration of SARS-CoV-2 viral copies this assay can detect is 131 copies/mL. A negative result does not preclude SARS-Cov-2 infection and should not be used as the sole basis for  treatment or other patient management decisions. A negative result may occur with  improper specimen collection/handling, submission of specimen other than nasopharyngeal swab, presence of viral mutation(s) within the areas targeted by this assay, and inadequate number of viral copies (<131 copies/mL). A negative result must be combined with clinical observations, patient history, and epidemiological information. The expected result is Negative. Fact Sheet for Patients:  https://www.moore.com/ Fact Sheet for Healthcare Providers:  https://www.young.biz/ This test is not yet ap proved or cleared by the [place] FDA and  has been authorized for detection  and/or diagnosis of SARS-CoV-2 by FDA under an Emergency Use Authorization (EUA). This EUA will remain  in effect (meaning this test can be used) for the duration of the COVID-19 declaration under Section 564(b)(1) of the Act, 21 U.S.C. section 360bbb-3(b)(1), unless the authorization is terminated or revoked sooner.    Influenza A by PCR NEGATIVE NEGATIVE Final   Influenza B by PCR NEGATIVE NEGATIVE Final    Comment: (NOTE) The Xpert Xpress SARS-CoV-2/FLU/RSV assay is intended as an aid in  the diagnosis of influenza from Nasopharyngeal swab specimens and  should not be used as a sole basis for treatment. Nasal washings and  aspirates are unacceptable for Xpert Xpress SARS-CoV-2/FLU/RSV  testing. Fact Sheet for Patients: https://www.moore.com/ Fact Sheet for Healthcare Providers: https://www.young.biz/ This test is not yet approved or cleared by the [place] FDA and  has been authorized for detection and/or diagnosis of SARS-CoV-2 by  FDA under an Emergency Use Authorization (EUA). This EUA will remain  in effect (meaning this test can be used) for the duration of the  Covid-19 declaration under Section 564(b)(1) of the Act, 21  U.S.C. section 360bbb-3(b)(1), unless the authorization is  terminated or revoked. Performed at [facility], [address], [place], [place] [address]   Aerobic/Anaerobic Culture (surgical/deep wound)     Status: None (Preliminary result)   Collection Time: 11/05/19  1:34 PM   Specimen: Soft Tissue, Other  Result Value Ref Range Status   Specimen Description   Final    BONE RIGHT FIBULA BX Performed at [facility], [address], [place], [place] [address]    Special Requests   Final    VANCOMYCIN Performed at [facility], [address], [place], [place] [address]    Gram Stain   Final    MODERATE WBC PRESENT,BOTH PMN AND MONONUCLEAR FEW GRAM POSITIVE COCCI    Culture   Final    RARE BACILLUS  SPECIES Standardized susceptibility testing for this organism is not available. CULTURE REINCUBATED FOR BETTER GROWTH Performed at [facility], [address], [place], [place] [address]    Report Status PENDING  Incomplete  Aerobic/Anaerobic Culture (surgical/deep wound)     Status: None (Preliminary result)   Collection Time: 11/05/19  1:37 PM   Specimen: Soft Tissue, Other  Result Value Ref Range Status   Specimen Description   Final    TISSUE RIGHT ANKLE Performed at [facility], [address], [place], [place] [address]    Special Requests   Final    VANCOMYCIN Performed at [facility], [address] [place], [place] [address]    Gram Stain   Final    FEW WBC PRESENT,BOTH PMN AND MONONUCLEAR ABUNDANT GRAM POSITIVE COCCI Performed at [facility], [address], [place], [place] [address]    Culture   Final    RARE STAPHYLOCOCCUS AUREUS NO ANAEROBES ISOLATED; CULTURE IN PROGRESS FOR 5 DAYS  Report Status PENDING  Incomplete     No results found.  Medications Scheduled Meds: . amiodarone  200 mg Oral BID  . apixaban  5 mg Oral BID  . aspirin EC  81 mg Oral Daily  . atorvastatin  20 mg Oral Daily  . diltiazem  120 mg Oral Daily  . DULoxetine  60 mg Oral Daily  . gabapentin  600 mg Oral QHS  . insulin aspart  0-5 Units Subcutaneous QHS  . insulin aspart  0-9 Units Subcutaneous TID WC  . insulin glargine  20 Units Subcutaneous QHS  . polyethylene glycol  17 g Oral Daily   Continuous Infusions: . vancomycin 1,000 mg (11/08/19 0548)   PRN Meds:.acetaminophen **OR** acetaminophen, albuterol, cyclobenzaprine, diphenhydrAMINE, gadobutrol, hydrALAZINE, HYDROcodone-acetaminophen, morphine injection, ondansetron **OR** ondansetron (ZOFRAN) IV  Assessment 1.  Postoperative day 3 status post I&D of right ankle abscess. 2.  Status post partial third ray amputation  3.  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 4.  Diabetes mellitus with peripheral  neuropathy.  Plan The packing from the amputation site of the right third toe was removed.  The wound was irrigated with sterile saline.  The wound was repacked with iodoform gauze.  The packing was removed from the medial and lateral ankle wounds.  Both wounds were palpated with some seropurulent exudate expressed.  Both wounds were compressed until no further exudate was encountered.  Both wounds were irrigated with copious amounts of sterile saline.  A negative pressure therapy dressing was applied to both ankle wounds and bridged to the anterior aspect of the ankle and connected to his home VAC unit.  The wound VAC was set at 125 mmHg of continuous pressure.  A gauze dressing was applied over the wound VAC dressing.  He tolerated the procedure well.  No local anesthesia was required.  Case discussed with Dr. Karilyn Cota at bedside.  He may be discharged from my standpoint.    HOME HEALTH NEEDS: 1.  IV antibiotics. We will proceed with a 6 week course of IV vancomycin with start date of 11/05/2019.  I will continue to follow his culture results to determine if there needs to be a change in his antibiotic therapy.   2.  Wound VAC dressing changes three times per week.  Next dressing change should be on Monday.  FOLLOW-UP:  Friday 11/14/2019 at 4:45 PM in my [place] office.  Marius Ditch 11/08/2019, 9:01 AM

## 2019-11-10 LAB — CULTURE, BLOOD (ROUTINE X 2)
Culture: NO GROWTH
Culture: NO GROWTH
Special Requests: ADEQUATE
Special Requests: ADEQUATE

## 2019-11-10 LAB — AEROBIC/ANAEROBIC CULTURE W GRAM STAIN (SURGICAL/DEEP WOUND)

## 2019-11-10 LAB — SURGICAL PATHOLOGY

## 2019-11-10 NOTE — Progress Notes (Deleted)
Cardiology Office Note    Date:  11/10/2019   ID:  [name], DOB [birth year], MRN [id number]  PCP:  Dettinger, [name], MD  Cardiologist: Rozann Lesches, MD    No chief complaint on file.   History of Present Illness:    [name] is a 54 y.o. male ***    Past Medical History:  Diagnosis Date  . Charcot ankle   . Hypertension   . Osteomyelitis of toe of right foot ([place])   . Recurrent boils   . Type 2 diabetes mellitus with diabetic neuropathy [facility])    Diagnosed age 5    Past Surgical History:  Procedure Laterality Date  . AMPUTATION Right 06/17/2014   Procedure: PARTIAL AMPUTATION RIGHT 3RD TOE;  Surgeon: Marcheta Grammes, DPM;  Location: [facility];  Service: Podiatry;  Laterality: Right;  . AMPUTATION Right 09/02/2014   Procedure: PARTIAL AMPUTATION 2ND TOE RIGHT FOOT;  Surgeon: Marcheta Grammes, DPM;  Location: [facility];  Service: Podiatry;  Laterality: Right;  . AMPUTATION Right 04/01/2015   Procedure: AMPUTATION DIGIT 1ST TOE RIGHT FOOT;  Surgeon: Juanita Laster, DPM;  Location: [facility];  Service: Podiatry;  Laterality: Right;  . AMPUTATION Right 06/29/2016   pinkeye   . AMPUTATION Right 10/24/2019   Procedure: PARTIAL THIRD RAY AMPUTATION;  Surgeon: [name], DPM;  Location: [facility];  Service: Podiatry;  Laterality: Right;  . FOOT AMPUTATION Left   . FOOT SURGERY Left 03/2017  . HERNIA REPAIR Bilateral    and 3rd hernia repair does not remember ehat side  . IRRIGATION AND DEBRIDEMENT ABSCESS Right 11/05/2019   Procedure: IRRIGATION AND DRAINAGE ABSCESS RIGHT ANKLE;  Surgeon: [name], DPM;  Location: [facility];  Service: Podiatry;  Laterality: Right;  . TEE WITHOUT CARDIOVERSION N/A 10/29/2019   Procedure: TRANSESOPHAGEAL ECHOCARDIOGRAM (TEE) WITH PROPOFOL;  Surgeon: Fay Records, MD;  Location: [facility];  Service: Cardiovascular;  Laterality: N/A;    Current Medications: Outpatient Medications Prior to Visit  Medication Sig  Dispense Refill  . albuterol (PROVENTIL HFA;VENTOLIN HFA) 108 (90 Base) MCG/ACT inhaler Inhale 2 puffs into the lungs every 6 (six) hours as needed for wheezing or shortness of breath. 1 Inhaler 0  . amiodarone (PACERONE) 200 MG tablet Take 1 tablet (200 mg total) by mouth 2 (two) times daily. 60 tablet 0  . apixaban (ELIQUIS) 5 MG TABS tablet Take 67m po bid for 4 more days then 541mpo bid (Patient taking differently: Take 10 mg by mouth 2 (two) times daily. Take 1032mo bid for 4 more days then 5mg27m bid starting on 10/28/2019) 60 tablet 1  . aspirin (ASPIRIN EC) 81 MG EC tablet Take 81 mg by mouth daily. Swallow whole.    . atMarland Kitchenrvastatin (LIPITOR) 20 MG tablet Take 1 tablet (20 mg total) by mouth daily. 90 tablet 3  . cyclobenzaprine (FLEXERIL) 10 MG tablet Take 1 tablet (10 mg total) by mouth at bedtime as needed for muscle spasms. 30 tablet 3  . diltiazem (CARDIZEM CD) 120 MG 24 hr capsule Take 1 capsule (120 mg total) by mouth daily. 30 capsule 0  . diphenhydrAMINE (BENADRYL) 25 mg capsule Take 25 mg by mouth See admin instructions. To be given prior to Vancomycin administration due to rash (allergic reaction)    . DULoxetine (CYMBALTA) 60 MG capsule Take 1 capsule (60 mg total) by mouth daily. 90 capsule 3  . fluticasone (FLONASE) 50 MCG/ACT nasal spray Place 1 spray into  both nostrils 2 (two) times daily as needed for allergies or rhinitis. 16 g 6  . gabapentin (NEURONTIN) 600 MG tablet Take 1 tablet (600 mg total) by mouth at bedtime. 90 tablet 3  . HYDROcodone-acetaminophen (NORCO) 10-325 MG tablet Take 1 tablet by mouth 2 (two) times daily as needed for moderate pain. 60 tablet 0  . Insulin Glargine, 2 Unit Dial, (TOUJEO MAX SOLOSTAR) 300 UNIT/ML SOPN Inject 40-50 Units into the skin daily. 5 pen 5  . lisinopril (PRINIVIL,ZESTRIL) 5 MG tablet Take 1 tablet (5 mg total) by mouth daily. 90 tablet 3  . metFORMIN (GLUCOPHAGE) 1000 MG tablet Take 1 tablet (1,000 mg total) by mouth 2 (two) times  daily. 180 tablet 3  . Multiple Vitamins-Minerals (MULTIVITAMIN WITH MINERALS) tablet Take 1 tablet by mouth daily.    [place] Kitchen terbinafine (LAMISIL AT) 1 % cream Apply 1 application topically 2 (two) times daily. 42 g 0  . vancomycin IVPB Inject 1,500 mg into the vein every 12 (twelve) hours for 40 doses. Indication:  cellulitis Last Day of Therapy:  12/18/19 Labs - Sunday/Monday:  CBC/D, BMP, and vancomycin trough. Labs - Thursday:  BMP and vancomycin trough Labs - Every other week:  ESR and CRP 40 Units 0  . zinc gluconate 50 MG tablet Take 50 mg by mouth daily.     No facility-administered medications prior to visit.     Allergies:   Patient has no known allergies.   Social History   Socioeconomic History  . Marital status: Single    Spouse name: Not on file  . Number of children: Not on file  . Years of education: Not on file  . Highest education level: Not on file  Occupational History  . Occupation: Receiving  Tobacco Use  . Smoking status: Never Smoker  . Smokeless tobacco: Never Used  Substance and Sexual Activity  . Alcohol use: No  . Drug use: No  . Sexual activity: Not on file  Other Topics Concern  . Not on file  Social History Narrative  . Not on file   Social Determinants of Health   Financial Resource Strain:   . Difficulty of Paying Living Expenses: Not on file  Food Insecurity:   . Worried About Charity fundraiser in the Last Year: Not on file  . Ran Out of Food in the Last Year: Not on file  Transportation Needs:   . Lack of Transportation (Medical): Not on file  . Lack of Transportation (Non-Medical): Not on file  Physical Activity:   . Days of Exercise per Week: Not on file  . Minutes of Exercise per Session: Not on file  Stress:   . Feeling of Stress : Not on file  Social Connections:   . Frequency of Communication with Friends and Family: Not on file  . Frequency of Social Gatherings with Friends and Family: Not on file  . Attends Religious  Services: Not on file  . Active Member of Clubs or Organizations: Not on file  . Attends Archivist Meetings: Not on file  . Marital Status: Not on file     Family History:  The patient's ***family history includes Aneurysm in his father; Asthma in his father; COPD in his father; Diabetes in his brother and mother; Stroke in his brother and father.   Review of Systems:   Please see the history of present illness.     General:  No chills, fever, night sweats or weight changes.  Cardiovascular:  No chest pain, dyspnea on exertion, edema, orthopnea, palpitations, paroxysmal nocturnal dyspnea. Dermatological: No rash, lesions/masses Respiratory: No cough, dyspnea Urologic: No hematuria, dysuria Abdominal:   No nausea, vomiting, diarrhea, bright red blood per rectum, melena, or hematemesis Neurologic:  No visual changes, wkns, changes in mental status. All other systems reviewed and are otherwise negative except as noted above.   Physical Exam:    VS:  There were no vitals taken for this visit.   General: Well developed, well nourished,male appearing in no acute distress. Head: Normocephalic, atraumatic, sclera non-icteric, no xanthomas, nares are without discharge.  Neck: No carotid bruits. JVD not elevated.  Lungs: Respirations regular and unlabored, without wheezes or rales.  Heart: ***Regular rate and rhythm. No S3 or S4.  No murmur, no rubs, or gallops appreciated. Abdomen: Soft, non-tender, non-distended with normoactive bowel sounds. No hepatomegaly. No rebound/guarding. No obvious abdominal masses. Msk:  Strength and tone appear normal for age. No joint deformities or effusions. Extremities: No clubbing or cyanosis. No edema.  Distal pedal pulses are 2+ bilaterally. Neuro: Alert and oriented X 3. Moves all extremities spontaneously. No focal deficits noted. Psych:  Responds to questions appropriately with a normal affect. Skin: No rashes or lesions noted  Wt Readings  from Last 3 Encounters:  11/04/19 198 lb 6.6 oz (90 kg)  10/30/19 227 lb 4.7 oz (103.1 kg)  07/22/19 210 lb (95.3 kg)        Studies/Labs Reviewed:   EKG:  EKG is*** ordered today.  The ekg ordered today demonstrates ***  Recent Labs: 10/23/2019: TSH 0.929 11/04/2019: ALT 17; Magnesium 1.4 11/08/2019: BUN 8; Creatinine, Ser 0.60; Hemoglobin 8.6; Platelets 377; Potassium 4.0; Sodium 132   Lipid Panel    Component Value Date/Time   CHOL 126 07/22/2019 1310   TRIG 189 (H) 07/22/2019 1310   HDL 31 (L) 07/22/2019 1310   CHOLHDL 4.1 07/22/2019 1310   CHOLHDL 4.6 01/30/2018 1347   VLDL 54 (H) 01/30/2018 1347   LDLCALC 63 07/22/2019 1310    Additional studies/ records that were reviewed today include:  ***  Assessment:    No diagnosis found.   Plan:   In order of problems listed above:  1. ***    Medication Adjustments/Labs and Tests Ordered: Current medicines are reviewed at length with the patient today.  Concerns regarding medicines are outlined above.  Medication changes, Labs and Tests ordered today are listed in the Patient Instructions below. There are no Patient Instructions on file for this visit.   Signed, Erma Heritage, PA-C  11/10/2019 8:00 AM    [facility] [address] [place], [place] [address] Phone: [phone] Fax: [phone]

## 2019-11-11 ENCOUNTER — Ambulatory Visit: Payer: Medicare Other | Admitting: Student

## 2019-11-12 ENCOUNTER — Encounter: Payer: Self-pay | Admitting: Family Medicine

## 2019-11-12 ENCOUNTER — Ambulatory Visit (INDEPENDENT_AMBULATORY_CARE_PROVIDER_SITE_OTHER): Payer: Medicare Other | Admitting: Family Medicine

## 2019-11-12 DIAGNOSIS — E1165 Type 2 diabetes mellitus with hyperglycemia: Secondary | ICD-10-CM | POA: Diagnosis not present

## 2019-11-12 DIAGNOSIS — E11628 Type 2 diabetes mellitus with other skin complications: Secondary | ICD-10-CM

## 2019-11-12 DIAGNOSIS — N179 Acute kidney failure, unspecified: Secondary | ICD-10-CM

## 2019-11-12 DIAGNOSIS — Z794 Long term (current) use of insulin: Secondary | ICD-10-CM

## 2019-11-12 DIAGNOSIS — G8929 Other chronic pain: Secondary | ICD-10-CM

## 2019-11-12 DIAGNOSIS — M79673 Pain in unspecified foot: Secondary | ICD-10-CM

## 2019-11-12 DIAGNOSIS — E1142 Type 2 diabetes mellitus with diabetic polyneuropathy: Secondary | ICD-10-CM

## 2019-11-12 DIAGNOSIS — M869 Osteomyelitis, unspecified: Secondary | ICD-10-CM | POA: Diagnosis not present

## 2019-11-12 DIAGNOSIS — Z89512 Acquired absence of left leg below knee: Secondary | ICD-10-CM

## 2019-11-12 DIAGNOSIS — Z0289 Encounter for other administrative examinations: Secondary | ICD-10-CM

## 2019-11-12 DIAGNOSIS — L089 Local infection of the skin and subcutaneous tissue, unspecified: Secondary | ICD-10-CM

## 2019-11-12 MED ORDER — HYDROCODONE-ACETAMINOPHEN 10-325 MG PO TABS: 1.0000 | ORAL_TABLET | Freq: Two times a day (BID) | ORAL | 0 refills | Status: DC | PRN

## 2019-11-12 NOTE — Progress Notes (Signed)
Virtual Visit via telephone Note  I connected with [name] on 11/12/19 at 1011 by telephone and verified that I am speaking with the correct person using two identifiers. [name] is currently located at home and no other people are currently with her during visit. The provider, [name] Britny Riel, MD is located in their office at time of visit.  Call ended at 1029  I discussed the limitations, risks, security and privacy concerns of performing an evaluation and management service by telephone and the availability of in person appointments. I also discussed with the patient that there may be a patient responsible charge related to this service. The patient expressed understanding and agreed to proceed.   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  He is getting home health care and wound vac on his right foot. Patient has been controlled better since leaving the hospital and his sugar has been improved.  He was in the hospital over 2 occasions initially for the amputation and then was sent back by Dr. [name] because it was looking more infected and had to do a debridement.  He says is going well now and he has wound care and a wound VAC and home health care coming out regularly.  Pain assessment: Cause of pain- back pain and foot after amputation Pain location- foot and lower back  Pain on scale of 1-10- 5 Frequency- daily What increases pain-recent infection What makes pain Better- medication Effects on ADL - amputations limit him Any change in general medical condition-recent amputations on right foot.   Current opioids rx- hydrocodone 10-325 bid prn # meds rx- 60  Effectiveness of current meds-works well Adverse reactions form pain meds-none Morphine equivalent- 20  Pill count performed-No Last drug screen - 01/10/2019 ( high risk q44m moderate risk q661mlow risk yearly ) Urine drug screen  today- No Was the NCMilfordeviewed- yes  If yes were their any concerning findings? - none  No flowsheet data found.   Pain contract signed on: 01/10/2019  1. Uncontrolled type 2 diabetes mellitus with hyperglycemia, with long-term current use of insulin ([facility]  2. Type 2 diabetes mellitus with diabetic polyneuropathy, with long-term current use of insulin ([facility]  3. Osteomyelitis of toe of right foot ([facility]  4. Diabetic foot infection ([facility] [place]  5. AKI (acute kidney injury) ([facility]  6. Chronic foot pain, unspecified laterality   7. S/P BKA (below knee amputation) unilateral, left ([facility] [place]  8. Pain management contract signed     Outpatient Encounter Medications as of 11/12/2019  Medication Sig  . albuterol (PROVENTIL HFA;VENTOLIN HFA) 108 (90 Base) MCG/ACT inhaler Inhale 2 puffs into the lungs every 6 (six) hours as needed for wheezing or shortness of breath.  . Marland Kitchenmiodarone (PACERONE) 200 MG tablet Take 1 tablet (200 mg total) by mouth 2 (two) times daily.  . Marland Kitchenpixaban (ELIQUIS) 5 MG TABS tablet Take 1062mo bid for 4 more days then 5mg39m bid (Patient taking differently: Take 10 mg by mouth 2 (two) times daily. Take 10mg11mbid for 4 more days then 5mg p109mid starting on 10/28/2019)  . aspirin (ASPIRIN EC) 81 MG EC tablet Take 81 mg by mouth daily. Swallow whole.  . atorMarland Kitchenastatin (LIPITOR) 20 MG tablet Take 1 tablet (20 mg total) by mouth daily.  . cyclobenzaprine (FLEXERIL) 10 MG tablet Take 1 tablet (10  mg total) by mouth at bedtime as needed for muscle spasms.  Marland Kitchen diltiazem (CARDIZEM CD) 120 MG 24 hr capsule Take 1 capsule (120 mg total) by mouth daily.  . diphenhydrAMINE (BENADRYL) 25 mg capsule Take 25 mg by mouth See admin instructions. To be given prior to Vancomycin administration due to rash (allergic reaction)  . DULoxetine (CYMBALTA) 60 MG capsule Take 1 capsule (60 mg total) by mouth daily.  . fluticasone (FLONASE) 50 MCG/ACT nasal spray Place 1 spray into both nostrils 2 (two) times daily  as needed for allergies or rhinitis.  Marland Kitchen gabapentin (NEURONTIN) 600 MG tablet Take 1 tablet (600 mg total) by mouth at bedtime.  Marland Kitchen HYDROcodone-acetaminophen (NORCO) 10-325 MG tablet Take 1 tablet by mouth 2 (two) times daily as needed for moderate pain.  Derrill Memo ON 12/12/2019] HYDROcodone-acetaminophen (NORCO) 10-325 MG tablet Take 1 tablet by mouth 2 (two) times daily as needed.  Derrill Memo ON 01/09/2020] HYDROcodone-acetaminophen (NORCO) 10-325 MG tablet Take 1 tablet by mouth 2 (two) times daily as needed.  . Insulin Glargine, 2 Unit Dial, (TOUJEO MAX SOLOSTAR) 300 UNIT/ML SOPN Inject 40-50 Units into the skin daily.  Marland Kitchen lisinopril (PRINIVIL,ZESTRIL) 5 MG tablet Take 1 tablet (5 mg total) by mouth daily.  . metFORMIN (GLUCOPHAGE) 1000 MG tablet Take 1 tablet (1,000 mg total) by mouth 2 (two) times daily.  . Multiple Vitamins-Minerals (MULTIVITAMIN WITH MINERALS) tablet Take 1 tablet by mouth daily.  Marland Kitchen terbinafine (LAMISIL AT) 1 % cream Apply 1 application topically 2 (two) times daily.  . vancomycin IVPB Inject 1,500 mg into the vein every 12 (twelve) hours for 40 doses. Indication:  cellulitis Last Day of Therapy:  12/18/19 Labs - Sunday/Monday:  CBC/D, BMP, and vancomycin trough. Labs - Thursday:  BMP and vancomycin trough Labs - Every other week:  ESR and CRP  . zinc gluconate 50 MG tablet Take 50 mg by mouth daily.  . [DISCONTINUED] HYDROcodone-acetaminophen (NORCO) 10-325 MG tablet Take 1 tablet by mouth 2 (two) times daily as needed for moderate pain.   No facility-administered encounter medications on file as of 11/12/2019.    Review of Systems  Constitutional: Negative for chills and fever.  Respiratory: Negative for shortness of breath and wheezing.   Cardiovascular: Negative for chest pain and leg swelling.  Musculoskeletal: Negative for back pain and gait problem.  Skin: Positive for color change and wound. Negative for rash.  Neurological: Negative for dizziness, weakness and  light-headedness.  All other systems reviewed and are negative.   Observations/Objective: Patient sounds comfortable and in no acute distress  Assessment and Plan: Problem List Items Addressed This Visit      Endocrine   Type 2 diabetes mellitus ([place])   Diabetic foot infection ([place])   Uncontrolled type 2 diabetes mellitus with hyperglycemia, with long-term current use of insulin ([place]) - Primary     Musculoskeletal and Integument   Osteomyelitis of toe of right foot ([place])     Genitourinary   AKI (acute kidney injury) ([place])     Other   Pain management contract signed   Relevant Medications   HYDROcodone-acetaminophen (NORCO) 10-325 MG tablet   HYDROcodone-acetaminophen (NORCO) 10-325 MG tablet (Start on 12/12/2019)   HYDROcodone-acetaminophen (NORCO) 10-325 MG tablet (Start on 01/09/2020)   S/P BKA (below knee amputation) unilateral, left (HCC)   Relevant Medications   HYDROcodone-acetaminophen (NORCO) 10-325 MG tablet   HYDROcodone-acetaminophen (NORCO) 10-325 MG tablet (Start on 12/12/2019)   HYDROcodone-acetaminophen (NORCO) 10-325 MG tablet (Start on 01/09/2020)  Other Visit Diagnoses    Chronic foot pain, unspecified laterality       Relevant Medications   HYDROcodone-acetaminophen (NORCO) 10-325 MG tablet   HYDROcodone-acetaminophen (NORCO) 10-325 MG tablet (Start on 12/12/2019)   HYDROcodone-acetaminophen (NORCO) 10-325 MG tablet (Start on 01/09/2020)      Patient has home health care coming out and they have been doing regular blood work every week through home health care and he will get them to send me copies of it.  We will continue hydrocodone for pain, it sounds like he is trying to keep a better eye on his diabetes and will monitor that and look at the blood work that they give [place] as well from the home health.  He currently has wound VAC through home health and has been seeing Dr. [name] for his feet as well. Follow up plan: Return in about 3 months (around  02/10/2020), or if symptoms worsen or fail to improve, for Diabetes and pain management.     I discussed the assessment and treatment plan with the patient. The patient was provided an opportunity to ask questions and all were answered. The patient agreed with the plan and demonstrated an understanding of the instructions.   The patient was advised to call back or seek an in-person evaluation if the symptoms worsen or if the condition fails to improve as anticipated.  The above assessment and management plan was discussed with the patient. The patient verbalized understanding of and has agreed to the management plan. Patient is aware to call the clinic if symptoms persist or worsen. Patient is aware when to return to the clinic for a follow-up visit. Patient educated on when it is appropriate to go to the emergency department.    I provided 18 minutes of non-face-to-face time during this encounter.    [name], MD

## 2019-11-18 ENCOUNTER — Telehealth: Payer: Self-pay

## 2019-11-18 NOTE — Telephone Encounter (Signed)
Received call from [facility] with [facility] stating lab result collected on 11/17/19 (CBC)  WBC: 2.9 Still awaiting other labs to result. Routing to Dr. Orvan Falconer to make aware. Valarie Cones

## 2019-11-19 ENCOUNTER — Other Ambulatory Visit: Payer: Self-pay

## 2019-11-19 DIAGNOSIS — J101 Influenza due to other identified influenza virus with other respiratory manifestations: Secondary | ICD-10-CM

## 2019-11-19 NOTE — Progress Notes (Signed)
Entered in error

## 2019-11-19 NOTE — Telephone Encounter (Signed)
All of Mr. Tripodi cultures have grown MSSA.  The leukopenia is secondary to vancomycin.  Please give orders to change vancomycin to IV cefazolin 2 g every 8 hours with the same end date of 12/18/2019.  He needs a weekly CBC, BMP, sed rate and C-reactive protein.  Please schedule Mr. Koppen to follow-up with me around his end date.

## 2019-11-19 NOTE — Telephone Encounter (Addendum)
Verbal order given to [facility] with [facility] per Dr. Orvan Falconer  change vancomycin to IV cefazolin 2 g every 8 hours with the same end date of 12/18/2019.  He needs a weekly CBC, BMP, sed rate and C-reactive protein.  Verbal order read back and understood.   Called patient and made aware of change in therapy. Patient voiced concern of having a rash to a previous antibiotic but was not sure was it was.  Chart reviewed, no allergies listed and patient was previously on cefzolin with no issues. Advised patient of the same.   Appointment scheduled 12/23/19. Patient was unable to accept any appointment before therapy end date, due to transportation.   Valarie Cones

## 2019-11-21 ENCOUNTER — Telehealth: Payer: Self-pay | Admitting: *Deleted

## 2019-11-21 NOTE — Telephone Encounter (Signed)
VM from [facility] nurse w/ [facility] Pt's BP has been running low 100s/50s Wanting to now if he should hold his lisinopril Please advise

## 2019-11-21 NOTE — Telephone Encounter (Signed)
Yes have him hold his lisinopril, if things worsen the need is to be evaluated to make sure he does not have some come infection going on.

## 2019-11-21 NOTE — Telephone Encounter (Signed)
Aware of provider's advice. 

## 2019-12-09 ENCOUNTER — Ambulatory Visit (INDEPENDENT_AMBULATORY_CARE_PROVIDER_SITE_OTHER): Payer: Medicare Other | Admitting: Student

## 2019-12-09 ENCOUNTER — Encounter: Payer: Self-pay | Admitting: Student

## 2019-12-09 ENCOUNTER — Other Ambulatory Visit: Payer: Self-pay

## 2019-12-09 VITALS — BP 108/69 | HR 90 | Temp 97.3°F | Ht 76.0 in

## 2019-12-09 DIAGNOSIS — I10 Essential (primary) hypertension: Secondary | ICD-10-CM | POA: Diagnosis not present

## 2019-12-09 DIAGNOSIS — I82431 Acute embolism and thrombosis of right popliteal vein: Secondary | ICD-10-CM | POA: Diagnosis not present

## 2019-12-09 DIAGNOSIS — L089 Local infection of the skin and subcutaneous tissue, unspecified: Secondary | ICD-10-CM

## 2019-12-09 DIAGNOSIS — E11628 Type 2 diabetes mellitus with other skin complications: Secondary | ICD-10-CM

## 2019-12-09 DIAGNOSIS — Z794 Long term (current) use of insulin: Secondary | ICD-10-CM

## 2019-12-09 DIAGNOSIS — E1165 Type 2 diabetes mellitus with hyperglycemia: Secondary | ICD-10-CM

## 2019-12-09 DIAGNOSIS — I480 Paroxysmal atrial fibrillation: Secondary | ICD-10-CM

## 2019-12-09 MED ORDER — APIXABAN 5 MG PO TABS: 5.0000 mg | ORAL_TABLET | Freq: Two times a day (BID) | ORAL | 11 refills | Status: DC

## 2019-12-09 MED ORDER — AMIODARONE HCL 200 MG PO TABS: 200.0000 mg | ORAL_TABLET | Freq: Every day | ORAL | 1 refills | Status: DC

## 2019-12-09 NOTE — Progress Notes (Signed)
Cardiology Office Note    Date:  12/09/2019   ID:  JOSAIAH MUHAMMED, DOB 1966-09-12, MRN [id number]  PCP:  Dettinger, [name], MD  Cardiologist: Rozann Lesches, MD    Chief Complaint  Patient presents with  . Hospitalization Follow-up    History of Present Illness:    VIDYUTH BELSITO is a 54 y.o. male with past medical history of Type 2 DM, HTN, HLD and Charcot foot who presents to the office today for hospital follow-up.   He was admitted to [facility] from 12/16 - 10/30/2019 for sepsis in the setting of a right foot infection with MSSA cellulitis and osteomyelitis. 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 Was started on Unasyn but a TEE was recommended later that admission given MSSA bacteremia. Cardiology was consulted as he was found to be in atrial fibrillation with RVR and started on Cardizem and IV Amiodarone. He converted back to NSR prior to undergoing DCCV and was discharged on Cardizem CD 120mg  daily and Amiodarone 200mg  BID. He was started on Eliquis for anticoagulation but this was initiated at 10mg  BID for 7 days followed by 5mg  BID given a new diagnosis of right popliteal and peroneal DVT during admission. TEE was performed by Dr. Harrington Challenger on 12/23 and showed no evidence of vegetations.   Was again admitted from 12/29 - 11/08/2019 for cellulitis and abscess of the right ankle which required I&D. He had experienced a reaction to Ancef as an outpatient and it was recommended by ID for him to be on Vancomycin for 6 weeks. No changes were made to his medication regimen and by review of EKG tracings, he maintained NSR.    In talking with the patient and his significant other today, he reports overall doing well since his recent hospitalization. He is being followed by home health and undergoes wound VAC changes every 2 to 3 days. He is still receiving IV antibiotics and has follow-up with Podiatry later today.  He denies any recent chest pain or palpitations. Was overall  unaware of his arrhythmia during admission. He reports his vitals have been stable when checked by home health. BP was soft last month, therefore his PCP discontinued Lisinopril.  He was discharged on Cardizem CD but refills were not provided, therefore he discontinued this several weeks ago. BP is at 108/69 during today's visit.  He denies any dyspnea on exertion, orthopnea, PND or lower extremity edema.   Past Medical History:  Diagnosis Date  . Atrial fibrillation ([place])   . Charcot ankle   . Hypertension   . Osteomyelitis of toe of right foot ([place])   . Recurrent boils   . Type 2 diabetes mellitus with diabetic neuropathy [facility])    Diagnosed age 68    Past Surgical History:  Procedure Laterality Date  . AMPUTATION Right 06/17/2014   Procedure: PARTIAL AMPUTATION RIGHT 3RD TOE;  Surgeon: Marcheta Grammes, DPM;  Location: [facility];  Service: Podiatry;  Laterality: Right;  . AMPUTATION Right 09/02/2014   Procedure: PARTIAL AMPUTATION 2ND TOE RIGHT FOOT;  Surgeon: Marcheta Grammes, DPM;  Location: [facility];  Service: Podiatry;  Laterality: Right;  . AMPUTATION Right 04/01/2015   Procedure: AMPUTATION DIGIT 1ST TOE RIGHT FOOT;  Surgeon: Juanita Laster, DPM;  Location: [facility];  Service: Podiatry;  Laterality: Right;  . AMPUTATION Right 06/29/2016   pinkeye   . AMPUTATION Right 10/24/2019   Procedure: PARTIAL THIRD RAY AMPUTATION;  Surgeon: [name], DPM;  Location: [facility];  Service: Podiatry;  Laterality: Right;  . FOOT AMPUTATION Left   . FOOT SURGERY Left 03/2017  . HERNIA REPAIR Bilateral    and 3rd hernia repair does not remember ehat side  . IRRIGATION AND DEBRIDEMENT ABSCESS Right 11/05/2019   Procedure: IRRIGATION AND DRAINAGE ABSCESS RIGHT ANKLE;  Surgeon: Ferman Hamming, DPM;  Location: [facility];  Service: Podiatry;  Laterality: Right;  . TEE WITHOUT CARDIOVERSION N/A 10/29/2019   Procedure: TRANSESOPHAGEAL ECHOCARDIOGRAM (TEE) WITH PROPOFOL;  Surgeon: Pricilla Riffle, MD;  Location: [facility];  Service: Cardiovascular;  Laterality: N/A;    Current Medications: Outpatient Medications Prior to Visit  Medication Sig Dispense Refill  . albuterol (PROVENTIL HFA;VENTOLIN HFA) 108 (90 Base) MCG/ACT inhaler Inhale 2 puffs into the lungs every 6 (six) hours as needed for wheezing or shortness of breath. 1 Inhaler 0  . atorvastatin (LIPITOR) 20 MG tablet Take 1 tablet (20 mg total) by mouth daily. 90 tablet 3  . cyclobenzaprine (FLEXERIL) 10 MG tablet Take 1 tablet (10 mg total) by mouth at bedtime as needed for muscle spasms. 30 tablet 3  . diphenhydrAMINE (BENADRYL) 25 mg capsule Take 25 mg by mouth See admin instructions. To be given prior to Vancomycin administration due to rash (allergic reaction)    . DULoxetine (CYMBALTA) 60 MG capsule Take 1 capsule (60 mg total) by mouth daily. 90 capsule 3  . fluticasone (FLONASE) 50 MCG/ACT nasal spray Place 1 spray into both nostrils 2 (two) times daily as needed for allergies or rhinitis. 16 g 6  . gabapentin (NEURONTIN) 600 MG tablet Take 1 tablet (600 mg total) by mouth at bedtime. 90 tablet 3  . [START ON 01/09/2020] HYDROcodone-acetaminophen (NORCO) 10-325 MG tablet Take 1 tablet by mouth 2 (two) times daily as needed. 60 tablet 0  . Insulin Glargine, 2 Unit Dial, (TOUJEO MAX SOLOSTAR) 300 UNIT/ML SOPN Inject 40-50 Units into the skin daily. 5 pen 5  . metFORMIN (GLUCOPHAGE) 1000 MG tablet Take 1 tablet (1,000 mg total) by mouth 2 (two) times daily. 180 tablet 3  . Multiple Vitamins-Minerals (MULTIVITAMIN WITH MINERALS) tablet Take 1 tablet by mouth daily.    Marland Kitchen terbinafine (LAMISIL AT) 1 % cream Apply 1 application topically 2 (two) times daily. 42 g 0  . zinc gluconate 50 MG tablet Take 50 mg by mouth daily.    Marland Kitchen amiodarone (PACERONE) 200 MG tablet Take 1 tablet (200 mg total) by mouth 2 (two) times daily. 60 tablet 0  . apixaban (ELIQUIS) 5 MG TABS tablet Take 10mg  po bid for 4 more days then 5mg  po bid (Patient  taking differently: Take 10 mg by mouth 2 (two) times daily. Take 10mg  po bid for 4 more days then 5mg  po bid starting on 10/28/2019) 60 tablet 1  . aspirin (ASPIRIN EC) 81 MG EC tablet Take 81 mg by mouth daily. Swallow whole.    . diltiazem (CARDIZEM CD) 120 MG 24 hr capsule Take 1 capsule (120 mg total) by mouth daily. 30 capsule 0  . HYDROcodone-acetaminophen (NORCO) 10-325 MG tablet Take 1 tablet by mouth 2 (two) times daily as needed for moderate pain. 60 tablet 0  . [START ON 12/12/2019] HYDROcodone-acetaminophen (NORCO) 10-325 MG tablet Take 1 tablet by mouth 2 (two) times daily as needed. 60 tablet 0  . lisinopril (PRINIVIL,ZESTRIL) 5 MG tablet Take 1 tablet (5 mg total) by mouth daily. 90 tablet 3   No facility-administered medications prior to visit.     Allergies:  Patient has no known allergies.   Social History   Socioeconomic History  . Marital status: Single    Spouse name: Not on file  . Number of children: Not on file  . Years of education: Not on file  . Highest education level: Not on file  Occupational History  . Occupation: Receiving  Tobacco Use  . Smoking status: Never Smoker  . Smokeless tobacco: Never Used  Substance and Sexual Activity  . Alcohol use: No  . Drug use: No  . Sexual activity: Not on file  Other Topics Concern  . Not on file  Social History Narrative  . Not on file   Social Determinants of Health   Financial Resource Strain:   . Difficulty of Paying Living Expenses: Not on file  Food Insecurity:   . Worried About Programme researcher, broadcasting/film/videounning Out of Food in the Last Year: Not on file  . Ran Out of Food in the Last Year: Not on file  Transportation Needs:   . Lack of Transportation (Medical): Not on file  . Lack of Transportation (Non-Medical): Not on file  Physical Activity:   . Days of Exercise per Week: Not on file  . Minutes of Exercise per Session: Not on file  Stress:   . Feeling of Stress : Not on file  Social Connections:   . Frequency of  Communication with Friends and Family: Not on file  . Frequency of Social Gatherings with Friends and Family: Not on file  . Attends Religious Services: Not on file  . Active Member of Clubs or Organizations: Not on file  . Attends BankerClub or Organization Meetings: Not on file  . Marital Status: Not on file     Family History:  The patient's family history includes Aneurysm in his father; Asthma in his father; COPD in his father; Diabetes in his brother and mother; Stroke in his brother and father.   Review of Systems:   Please see the history of present illness.     General:  No chills, fever, night sweats or weight changes. Positive for right foot pain (improving).  Cardiovascular:  No chest pain, dyspnea on exertion, edema, orthopnea, palpitations, paroxysmal nocturnal dyspnea. Dermatological: No rash, lesions/masses Respiratory: No cough, dyspnea Urologic: No hematuria, dysuria Abdominal:   No nausea, vomiting, diarrhea, bright red blood per rectum, melena, or hematemesis Neurologic:  No visual changes, wkns, changes in mental status.  All other systems reviewed and are otherwise negative except as noted above.   Physical Exam:    VS:  BP 108/69   Pulse 90   Temp (!) 97.3 F (36.3 C) (Temporal)   Ht 6\' 4"  (1.93 m)   SpO2 97%   BMI 24.15 kg/m    General: Well developed, well nourished,male appearing in no acute distress. Head: Normocephalic, atraumatic, sclera non-icteric, no xanthomas, nares are without discharge.  Neck: No carotid bruits. JVD not elevated.  Lungs: Respirations regular and unlabored, without wheezes or rales.  Heart: Regular rate and rhythm. No S3 or S4.  No murmur, no rubs, or gallops appreciated. Abdomen: Soft, non-tender, non-distended with normoactive bowel sounds. No hepatomegaly. No rebound/guarding. No obvious abdominal masses. Msk:  Strength and tone appear normal for age. No joint deformities or effusions. Extremities: No clubbing or cyanosis. No  edema. 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  Neuro: Alert and oriented X 3. Moves all extremities spontaneously. No focal deficits noted. Psych:  Responds to questions appropriately with a normal affect. Skin: No rashes or lesions noted  Wt Readings from Last 3 Encounters:  11/04/19 198 lb 6.6 oz (90 kg)  10/30/19 227 lb 4.7 oz (103.1 kg)  07/22/19 210 lb (95.3 kg)     Studies/Labs Reviewed:   EKG:  EKG is not ordered today. EKG from 11/04/2019 is reviewed which shows NSR, HR 85 with no acute ST abnormalities when compared to prior tracings.   Recent Labs: 10/23/2019: TSH 0.929 11/04/2019: ALT 17; Magnesium 1.4 11/08/2019: BUN 8; Creatinine, Ser 0.60; Hemoglobin 8.6; Platelets 377; Potassium 4.0; Sodium 132   Lipid Panel    Component Value Date/Time   CHOL 126 07/22/2019 1310   TRIG 189 (H) 07/22/2019 1310   HDL 31 (L) 07/22/2019 1310   CHOLHDL 4.1 07/22/2019 1310   CHOLHDL 4.6 01/30/2018 1347   VLDL 54 (H) 01/30/2018 1347   LDLCALC 63 07/22/2019 1310    Additional studies/ records that were reviewed today include:   Echocardiogram: 10/2019 IMPRESSIONS    1. Left ventricular ejection fraction, by visual estimation, is 70 to  75%. The left ventricle has hyperdynamic function. There is mildly  increased left ventricular hypertrophy.  2. Left ventricular diastolic parameters are indeterminate.  3. The left ventricle has no regional wall motion abnormalities.  4. Global right ventricle has normal systolic function.The right  ventricular size is normal. No increase in right ventricular wall  thickness.  5. Left atrial size was normal.  6. Right atrial size was normal.  7. The mitral valve is grossly normal. Trivial mitral valve  regurgitation.  8. The tricuspid valve is grossly normal. Tricuspid valve regurgitation  is trivial.  9. The aortic valve is tricuspid. Aortic valve regurgitation is not  visualized.  10. The pulmonic valve was grossly normal. Pulmonic valve  regurgitation is  not visualized.  11. Mildly elevated pulmonary artery systolic pressure.  12. The tricuspid regurgitant velocity is 3.04 m/s, and with an assumed  right atrial pressure of 3 mmHg, the estimated right ventricular systolic  pressure is mildly elevated at 40.0 mmHg.  13. The inferior vena cava is normal in size with greater than 50%  respiratory variability, suggesting right atrial pressure of 3 mmHg.   TEE: 10/2019 IMPRESSIONS    1. Left ventricular ejection fraction, by visual estimation, is 60 to  65%. The left ventricle has normal function. There is no left ventricular  hypertrophy.  2. The left ventricle has no regional wall motion abnormalities.  3. Global right ventricle has normal systolic function.The right  ventricular size is normal. Right vetricular wall thickness was not  assessed.  4. Left atrial size was normal.  5. Right atrial size was normal.  6. The mitral valve is normal in structure. No evidence of mitral valve  regurgitation.  7. The tricuspid valve is normal in structure.  8. The aortic valve is normal in structure. Aortic valve regurgitation is  not visualized.  9. The pulmonic valve was normal in structure. Pulmonic valve  regurgitation is trivial.   Assessment:    1. PAF (paroxysmal atrial fibrillation) (HCC)   2. Essential hypertension   3. Acute deep vein thrombosis (DVT) of popliteal vein of right lower extremity (HCC)   4. Diabetic foot infection (HCC)   5. Uncontrolled type 2 diabetes mellitus with hyperglycemia, with long-term current use of insulin (HCC)      Plan:   In order of problems listed above:  1. Paroxysmal Atrial Fibrillation - This was diagnosed in 10/2019 while the patient was admitted for sepsis in the setting of a  foot infection. He was started on IV Amiodarone and IV Cardizem during admission and converted back to normal sinus rhythm prior to requiring DCCV. - He was in normal sinus rhythm by EKG  during his recent admission and is maintaining normal rhythm by examination today. He has remained on Amiodarone 200 mg twice daily and actually self discontinued Cardizem CD several weeks ago due to not having refills of this. Will plan to reduce Amiodarone to 200 mg daily. Will review overall timeframe for which to continue this with Dr. Diona Browner but would anticipate a short-course of 1-2 months if no recurrence.  - This patients CHA2DS2-VASc Score and unadjusted Ischemic Stroke Rate (% per year) is equal to 7.2 % stroke rate/year from a score of 5 (HTN, DM, Vascular, DVT(2)). Continue Eliquis 5mg  BID for anticoagulation.   2. HTN - BP has been soft when checked by Home Health and is at 108/69 during today's visit. He has been without Cardizem CD for several weeks and given his soft BP and being in normal sinus rhythm, will not restart at this time. Lisinopril has also been held by his PCP. Could restart Lisinopril at a lower dose if BP started to trend upwards in the future.   3. DVT - lower extremity dopplers in 10/2019 were positive for a right lower extremity DVT with occlusive thrombus in the popliteal vein and nonocclusive thrombus in the peroneal vein. - was treated with Eliquis 10mg  BID for 10 days then reduced to 5mg  BID. Importance of anticoagulation reviewed with the patient.   4. Sepsis secondary to Diabetic Foot Infection - developed osteomyelitis and required right 3rd ray amputation on 10/24/2019. Recurrent admission for cellulitis and abscess of the right ankle which required I&D. On Vancomycin for 6 weeks per ID recommendations. He has follow-up with Podiatry later today.   5. Type 2 DM - Hgb A1c elevated to 9.8 during recent admission. Followed by PCP.    Medication Adjustments/Labs and Tests Ordered: Current medicines are reviewed at length with the patient today.  Concerns regarding medicines are outlined above.  Medication changes, Labs and Tests ordered today are listed in  the Patient Instructions below. Patient Instructions  Medication Instructions:  STOP Cardizem CD   DECREASE Amiodarone to 200 mg daily, STOP taking after 2 months unless informed otherwise.    *If you need a refill on your cardiac medications before your next appointment, please call your pharmacy*  Lab Work: NONE  If you have labs (blood work) drawn today and your tests are completely normal, you will receive your results only by: MyChart Message (if you have MyChart) OR . A paper copy in the mail If you have any lab test that is abnormal or we need to change your treatment, we will call you to review the results.  Testing/Procedures:  NONE  Follow-Up: At [facility], you and your health needs are our priority.  As part of our continuing mission to provide you with exceptional heart care, we have created designated Provider Care Teams.  These Care Teams include your primary Cardiologist (physician) and Advanced Practice Providers (APPs -  Physician Assistants and Nurse Practitioners) who all work together to provide you with the care you need, when you need it.  Your next appointment:   2 month(s)  The format for your next appointment:   In Person  Provider:   10/26/2019, MD  Other Instructions  None     Thank you for choosing [facility] !  Signed, Ellsworth Lennox, PA-C  12/09/2019 4:11 PM     [facility] [address] [place], [place] [address] Phone: [phone] Fax: [phone]

## 2019-12-09 NOTE — Patient Instructions (Addendum)
Medication Instructions:  STOP Cardizem CD   DECREASE Amiodarone to 200 mg daily, STOP taking after 2 months   *If you need a refill on your cardiac medications before your next appointment, please call your pharmacy*  Lab Work: NONE  If you have labs (blood work) drawn today and your tests are completely normal, you will receive your results only by: Marland Kitchen MyChart Message (if you have MyChart) OR . A paper copy in the mail If you have any lab test that is abnormal or we need to change your treatment, we will call you to review the results.  Testing/Procedures:  NONE  Follow-Up: At [facility], you and your health needs are our priority.  As part of our continuing mission to provide you with exceptional heart care, we have created designated Provider Care Teams.  These Care Teams include your primary Cardiologist (physician) and Advanced Practice Providers (APPs -  Physician Assistants and Nurse Practitioners) who all work together to provide you with the care you need, when you need it.  Your next appointment:   2 month(s)  The format for your next appointment:   In Person  Provider:   Nona Dell, MD  Other Instructions  None     Thank you for choosing  [facility] !

## 2019-12-11 ENCOUNTER — Telehealth: Payer: Self-pay | Admitting: Family Medicine

## 2019-12-11 NOTE — Telephone Encounter (Signed)
What is the name of the medication? Dexcom G6 Glucose Meter  Have you contacted your pharmacy to request a refill? Yes  Which pharmacy would you like this sent to?  [facility]   Patient notified that their request is being sent to the clinical staff for review and that they should receive a call once it is complete. If they do not receive a call within 24 hours they can check with their pharmacy or our office.   Dettinger's pt.  He wants [place] to [facility] to see, if it is cheaper with his Insurance.  Please call him.

## 2019-12-15 ENCOUNTER — Telehealth: Payer: Self-pay | Admitting: Family Medicine

## 2019-12-15 MED ORDER — DEXCOM G6 RECEIVER DEVI: 1.0000 | Freq: Four times a day (QID) | 3 refills | Status: DC

## 2019-12-15 MED ORDER — DEXCOM G6 TRANSMITTER MISC: 1.0000 | Freq: Four times a day (QID) | 3 refills | Status: DC

## 2019-12-15 MED ORDER — DEXCOM G6 SENSOR MISC: 1.0000 | 11 refills | Status: DC

## 2019-12-15 NOTE — Telephone Encounter (Signed)
Pt said he was told by his pharmacy that a request was sent to Dr Dettinger about getting the diagnosis codes sent to them so that pt can get the Glucose meter. Pt wants to get whatever is cheaper and says pharmacist cant do that without diagnosis codes.

## 2019-12-15 NOTE — Telephone Encounter (Signed)
I sent dexcom to see if covered [place] Care, MD [facility] 12/15/2019, 7:57 AM

## 2019-12-16 ENCOUNTER — Other Ambulatory Visit: Payer: Self-pay | Admitting: Family Medicine

## 2019-12-16 MED ORDER — DEXCOM G6 TRANSMITTER MISC: 1.0000 | Freq: Four times a day (QID) | 3 refills | Status: DC

## 2019-12-16 NOTE — Telephone Encounter (Signed)
Patient aware rx sent  

## 2019-12-17 ENCOUNTER — Ambulatory Visit: Payer: Medicare Other | Admitting: Internal Medicine

## 2019-12-22 ENCOUNTER — Telehealth: Payer: Self-pay | Admitting: Family Medicine

## 2019-12-22 ENCOUNTER — Telehealth: Payer: Self-pay

## 2019-12-22 NOTE — Telephone Encounter (Signed)
COVID-19 Pre-Screening Questions:12/22/19  Do you currently have a fever (>100 F), chills or unexplained body aches? NO Are you currently experiencing new cough, shortness of breath, sore throat, runny noseNO .  Have you recently travelled outside the state of [place] in the last 14 days?NO  .  Have you been in contact with someone that is currently pending confirmation of Covid19 testing or has been confirmed to have the Covid19 virus?  NO  **If the patient answers NO to ALL questions -  advise the patient to please call the clinic before coming to the office should any symptoms develop.

## 2019-12-22 NOTE — Telephone Encounter (Signed)
Noted - once received we will full out.

## 2019-12-23 ENCOUNTER — Encounter: Payer: Self-pay | Admitting: Internal Medicine

## 2019-12-23 ENCOUNTER — Other Ambulatory Visit: Payer: Self-pay

## 2019-12-23 ENCOUNTER — Ambulatory Visit (INDEPENDENT_AMBULATORY_CARE_PROVIDER_SITE_OTHER): Payer: Medicare Other | Admitting: Internal Medicine

## 2019-12-23 DIAGNOSIS — B9561 Methicillin susceptible Staphylococcus aureus infection as the cause of diseases classified elsewhere: Secondary | ICD-10-CM

## 2019-12-23 DIAGNOSIS — R7881 Bacteremia: Secondary | ICD-10-CM | POA: Diagnosis not present

## 2019-12-23 DIAGNOSIS — M65071 Abscess of tendon sheath, right ankle and foot: Secondary | ICD-10-CM | POA: Diagnosis present

## 2019-12-23 NOTE — Progress Notes (Signed)
[facility]  Reason for Consult: Diabetic foot infection and MSSA bacteremia  Assessment: [name] is doing better on therapy for a complicated diabetic foot infection and MSSA bacteremia.  His right lateral ankle incision is still healing and his inflammatory markers remain elevated.  I will extend his IV cefazolin therapy for 2 more weeks.  Plan: 1. Continue cefazolin 2. Follow-up here in 2 weeks  Patient Active Problem List   Diagnosis Date Noted  . Osteomyelitis of toe of right foot (HCC) 06/13/2014    Priority: High  . Cellulitis 11/04/2019  . Acute deep vein thrombosis (DVT) of right popliteal vein (HCC) 10/26/2019  . Staphylococcus aureus sepsis (HCC) 10/25/2019  . Uncontrolled type 2 diabetes mellitus with hyperglycemia, with long-term current use of insulin (HCC) 10/23/2019  . Elevated lactic acid level 10/22/2019  . Leukocytosis 10/22/2019  . A-fib (HCC) 10/22/2019  . Nausea & vomiting 10/22/2019  . AKI (acute kidney injury) (HCC) 10/22/2019  . Hypokalemia 10/22/2019  . Lumbar spine scoliosis 08/13/2018  . S/P BKA (below knee amputation) unilateral, left (HCC) 01/12/2018  . Pain management contract signed 05/04/2017  . Diabetic foot infection (HCC) 04/11/2017  . Cellulitis and abscess of foot 02/08/2017  . Peripheral edema 10/02/2016  . Polycythemia 12/09/2015  . Type 2 diabetes mellitus (HCC) 07/22/2015  . Neuropathy in diabetes (HCC) 03/31/2014  . Charcot ankle 03/09/2014  . Essential hypertension, benign 03/03/2013  . DDD (degenerative disc disease), lumbar 03/03/2013    Patient's Medications  New Prescriptions   No medications on file  Previous Medications   ALBUTEROL (PROVENTIL HFA;VENTOLIN HFA) 108 (90 BASE) MCG/ACT INHALER    Inhale 2 puffs into the lungs every 6 (six) hours as needed for wheezing or shortness of breath.   AMIODARONE (PACERONE) 200 MG TABLET    Take 1 tablet (200 mg total) by mouth daily.   APIXABAN  (ELIQUIS) 5 MG TABS TABLET    Take 1 tablet (5 mg total) by mouth 2 (two) times daily.   ATORVASTATIN (LIPITOR) 20 MG TABLET    Take 1 tablet (20 mg total) by mouth daily.   CEFAZOLIN (ANCEF) 10 G INJECTION       CONTINUOUS BLOOD GLUC RECEIVER (DEXCOM G6 RECEIVER) DEVI    1 each by Does not apply route 4 (four) times daily.   CONTINUOUS BLOOD GLUC SENSOR (DEXCOM G6 SENSOR) MISC    1 each by Does not apply route once a week.   CONTINUOUS BLOOD GLUC TRANSMIT (DEXCOM G6 TRANSMITTER) MISC    1 each by Does not apply route 4 (four) times daily. Dx: E11.65   CYCLOBENZAPRINE (FLEXERIL) 10 MG TABLET    Take 1 tablet (10 mg total) by mouth at bedtime as needed for muscle spasms.   DIPHENHYDRAMINE (BENADRYL) 25 MG CAPSULE    Take 25 mg by mouth See admin instructions. To be given prior to Vancomycin administration due to rash (allergic reaction)   DULOXETINE (CYMBALTA) 60 MG CAPSULE    Take 1 capsule (60 mg total) by mouth daily.   FLUTICASONE (FLONASE) 50 MCG/ACT NASAL SPRAY    Place 1 spray into both nostrils 2 (two) times daily as needed for allergies or rhinitis.   GABAPENTIN (NEURONTIN) 600 MG TABLET    Take 1 tablet (600 mg total) by mouth at bedtime.   HYDROCODONE-ACETAMINOPHEN (NORCO) 10-325 MG TABLET    Take 1 tablet by mouth 2 (two) times daily as needed.   INSULIN GLARGINE, 2  UNIT DIAL, (TOUJEO MAX SOLOSTAR) 300 UNIT/ML SOPN    Inject 40-50 Units into the skin daily.   METFORMIN (GLUCOPHAGE) 1000 MG TABLET    Take 1 tablet (1,000 mg total) by mouth 2 (two) times daily.   MULTIPLE VITAMINS-MINERALS (MULTIVITAMIN WITH MINERALS) TABLET    Take 1 tablet by mouth daily.   TERBINAFINE (LAMISIL AT) 1 % CREAM    Apply 1 application topically 2 (two) times daily.   ZINC GLUCONATE 50 MG TABLET    Take 50 mg by mouth daily.  Modified Medications   No medications on file  Discontinued Medications   No medications on file    HPI: [name] is a 54 y.o. male with diabetes complicated by peripheral  neuropathy and recurrent ulcers and foot infections.  Previously, he has had undergone left BKA, amputation of his right great toe and partial amputation of his right second and third toes.  He recently developed a new plantar ulcer on his right third toe.  He developed increasing erythema, swelling and purulent drainage leading to readmission on 10/22/2019.  He was febrile and was found to be in atrial fibrillation with a rapid ventricular rate.  Both admission blood cultures grew MSSA.  The BCID could not be done because there was a delay and folding the blood culture isolate from the [facility] to the main microbiology lab and his antibiotic susceptibilities are still pending.    He underwent amputation of his right third toe.  Operative cultures grew MSSA, Streptococcus anginosus and Prevotella melaninogenica.  Repeat blood cultures were negative.  There was no evidence of endocarditis by exam or TEE.  He was discharged on IV cefazolin and oral metronidazole with a planned 2-week postoperative course.  He was readmitted on 11/04/2019 with a right ankle abscess.  He underwent I&D.  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  He was discharged on IV cefazolin and has now completed 6 weeks of therapy postoperatively.  He is feeling much better.  He has not had any problems tolerating his cefazolin or PICC.  His right medial ankle incision has healed.  He still has a VAC wound dressing on his right lateral ankle incision.  He says the opening is much smaller and currently just a little larger than a nickel.  Review of Systems: Review of Systems  Constitutional: Positive for weight loss. Negative for chills, diaphoresis and fever.  Respiratory: Negative for cough and shortness of breath.   Cardiovascular: Negative for chest pain.  Gastrointestinal: Negative for abdominal pain,  diarrhea, nausea and vomiting.  Musculoskeletal: Positive for joint pain.  Skin: Negative for rash.      Past Medical History:  Diagnosis Date  . Atrial fibrillation (HCC)   . Charcot ankle   . Hypertension   . Osteomyelitis of toe of right foot (HCC)   . Recurrent boils   . Type 2 diabetes mellitus with diabetic neuropathy [facility])    Diagnosed age 82    Social History   Tobacco Use  . Smoking status: Never Smoker  . Smokeless tobacco: Never Used  Substance Use Topics  . Alcohol use: No  . Drug use: No    Family History  Problem Relation Age of Onset  . Stroke Father   . Asthma Father   . COPD Father   . Aneurysm Father  AAA  . Diabetes Brother   . Stroke Brother        2008  . Diabetes Mother    No Known Allergies  OBJECTIVE: Vitals:   12/23/19 1422  BP: 130/84  Pulse: 94  Temp: 97.8 F (36.6 C)  TempSrc: Oral  SpO2: 98%   There is no height or weight on file to calculate BMI.   Physical Exam Constitutional:      Comments: He is in good spirits.  Musculoskeletal:     Comments: He has some bleeding from his right lateral ankle incision.  VAC dressing is in place.  Skin:    Findings: No rash.     Comments: Right arm PIC site looks good.  Psychiatric:        Mood and Affect: Mood normal.   Lab work 2 06/07/2020 Sedimentation rate elevated at 82 C-reactive protein elevated at 30  Microbiology: No results found for this or any previous visit (from the past 240 hour(s)).  Michel Bickers, MD [facility] for Infectious [place] [facility] [phone] pager   [phone] cell 12/23/2019, 2:57 PM

## 2019-12-24 DIAGNOSIS — M65071 Abscess of tendon sheath, right ankle and foot: Secondary | ICD-10-CM | POA: Insufficient documentation

## 2019-12-24 DIAGNOSIS — R7881 Bacteremia: Secondary | ICD-10-CM | POA: Insufficient documentation

## 2019-12-24 MED ORDER — DEXCOM G6 RECEIVER DEVI: 1.0000 | Freq: Four times a day (QID) | 3 refills | Status: DC

## 2019-12-24 MED ORDER — DEXCOM G6 SENSOR MISC: 1.0000 | 11 refills | Status: DC

## 2019-12-24 NOTE — Telephone Encounter (Signed)
[facility] and they needed the rx for the sensors and receiver for Dexcom G6 sent to them. They had been sent to [facility] the first time. Resent the rx to  [facility].

## 2019-12-30 ENCOUNTER — Telehealth: Payer: Self-pay | Admitting: *Deleted

## 2019-12-30 NOTE — Telephone Encounter (Signed)
Spoke with Inetta Fermo at [facility]. She is scheduled for a visit with pt tomorrow and will repeat BMP at that time.

## 2019-12-30 NOTE — Telephone Encounter (Signed)
Can we repeat BMP to see if K+ is truly elevated? Thanks!

## 2019-12-30 NOTE — Telephone Encounter (Signed)
TC from [place] w/ [facility] Pt lab results K+ 5.6  Sodium 1.33   Creatnine 0.75   Hgb 11.6

## 2020-01-01 ENCOUNTER — Telehealth: Payer: Self-pay | Admitting: Family Medicine

## 2020-01-01 NOTE — Telephone Encounter (Signed)
Patient sodium is severely low at 126, his potassium is normal on this draw but his sodium is very low.  He needs to be redrawn today or tomorrow for a basic metabolic panel or he needs to go to the emergency department.  This needs to be stat redraw, if home health can do it and have them do it, if not have it done here in our office.

## 2020-01-01 NOTE — Telephone Encounter (Signed)
VO given to Inetta Fermo w/ [facility] for BMP

## 2020-01-01 NOTE — Telephone Encounter (Signed)
Yes that is fine, put in an order to [facility] for BMP

## 2020-01-01 NOTE — Telephone Encounter (Signed)
Pt called back to let us know that Inetta Fermo from [facility] says she can do it but needs order from Dr Louanne Skye. Pt says he cant come in because he doesn't have a ride.

## 2020-01-01 NOTE — Telephone Encounter (Signed)
Patient aware and verbalizes understanding - states he will ask HH.

## 2020-01-02 ENCOUNTER — Other Ambulatory Visit (HOSPITAL_COMMUNITY)
Admission: RE | Admit: 2020-01-02 | Discharge: 2020-01-02 | Disposition: A | Discharge status: Home / Self Care | Payer: Medicare Other | Source: Ambulatory Visit | Attending: Family Medicine | Admitting: Family Medicine

## 2020-01-02 DIAGNOSIS — E1169 Type 2 diabetes mellitus with other specified complication: Secondary | ICD-10-CM | POA: Diagnosis present

## 2020-01-02 LAB — BASIC METABOLIC PANEL
Anion gap: 9 (ref 5–15)
BUN: 14 mg/dL (ref 6–20)
CO2: 29 mmol/L (ref 22–32)
Calcium: 9.1 mg/dL (ref 8.9–10.3)
Chloride: 97 mmol/L — ABNORMAL LOW (ref 98–111)
Creatinine, Ser: 0.84 mg/dL (ref 0.61–1.24)
GFR calc Af Amer: >60 mL/min (ref >60)
GFR calc non Af Amer: >60 mL/min (ref >60)
Glucose, Bld: 159 mg/dL — ABNORMAL HIGH (ref 70–99)
Potassium: 4.7 mmol/L (ref 3.5–5.1)
Sodium: 135 mmol/L (ref 135–145)

## 2020-01-06 ENCOUNTER — Encounter: Payer: Self-pay | Admitting: Internal Medicine

## 2020-01-06 ENCOUNTER — Telehealth: Payer: Self-pay

## 2020-01-06 ENCOUNTER — Other Ambulatory Visit: Payer: Self-pay

## 2020-01-06 ENCOUNTER — Telehealth (INDEPENDENT_AMBULATORY_CARE_PROVIDER_SITE_OTHER): Payer: Medicare Other | Admitting: Internal Medicine

## 2020-01-06 DIAGNOSIS — M869 Osteomyelitis, unspecified: Secondary | ICD-10-CM | POA: Diagnosis not present

## 2020-01-06 DIAGNOSIS — M65071 Abscess of tendon sheath, right ankle and foot: Secondary | ICD-10-CM

## 2020-01-06 MED ORDER — CEPHALEXIN 500 MG PO CAPS: 500.0000 mg | ORAL_CAPSULE | Freq: Three times a day (TID) | ORAL | 0 refills | Status: DC

## 2020-01-06 NOTE — Assessment & Plan Note (Signed)
He is improving slowly on therapy for recent MSSA bacteremia and right ankle infection superimposed upon Charcot arthropathy.  Discontinue cefazolin and have his PICC removed now.  I will switch him to oral cephalexin and follow-up with him in 2 weeks.

## 2020-01-06 NOTE — Telephone Encounter (Signed)
Per Dr. Orvan Falconer called [facility] with orders to d/c IV antibiotics and pull picc. Spoke with Eunice Blase who was able to take orders. Will pull picc tomorrow.  Lab orders called to [facility]. Spoke with Victorino Dike who will fax weekly Sed Rate and CRP to office.  [facility]: [phone] [place], [place]

## 2020-01-06 NOTE — Progress Notes (Signed)
Virtual Visit via Video Note  I connected with@ on 01/06/20 at  2:30 PM EST by a video enabled telemedicine application and verified that I am speaking with the correct person using two identifiers.  Location: Patient: Home Provider: [facility]   I discussed the limitations of evaluation and management by telemedicine and the availability of in person appointments. The patient expressed understanding and agreed to proceed.  [facility]  Patient Active Problem List   Diagnosis Date Noted  . Abscess of tendon sheath, right ankle and foot 12/24/2019    Priority: High  . Bacteremia due to methicillin susceptible Staphylococcus aureus (MSSA) 12/24/2019    Priority: High  . Diabetic foot infection (HCC) 04/11/2017    Priority: High  . Osteomyelitis of toe of right foot (HCC) 06/13/2014    Priority: High  . Charcot ankle 03/09/2014    Priority: Medium  . Acute deep vein thrombosis (DVT) of right popliteal vein (HCC) 10/26/2019  . Uncontrolled type 2 diabetes mellitus with hyperglycemia, with long-term current use of insulin (HCC) 10/23/2019  . A-fib (HCC) 10/22/2019  . Lumbar spine scoliosis 08/13/2018  . S/P BKA (below knee amputation) unilateral, left (HCC) 01/12/2018  . Pain management contract signed 05/04/2017  . Peripheral edema 10/02/2016  . Polycythemia 12/09/2015  . Type 2 diabetes mellitus (HCC) 07/22/2015  . Neuropathy in diabetes (HCC) 03/31/2014  . Essential hypertension, benign 03/03/2013  . DDD (degenerative disc disease), lumbar 03/03/2013    Patient's Medications  New Prescriptions   CEPHALEXIN (KEFLEX) 500 MG CAPSULE    Take 1 capsule (500 mg total) by mouth 3 (three) times daily.  Previous Medications   ALBUTEROL (PROVENTIL HFA;VENTOLIN HFA) 108 (90 BASE) MCG/ACT INHALER    Inhale 2 puffs into the lungs every 6 (six) hours as needed for wheezing or shortness of breath.   AMIODARONE (PACERONE) 200 MG TABLET    Take 1 tablet (200 mg total) by  mouth daily.   APIXABAN (ELIQUIS) 5 MG TABS TABLET    Take 1 tablet (5 mg total) by mouth 2 (two) times daily.   ATORVASTATIN (LIPITOR) 20 MG TABLET    Take 1 tablet (20 mg total) by mouth daily.   CONTINUOUS BLOOD GLUC RECEIVER (DEXCOM G6 RECEIVER) DEVI    1 each by Does not apply route 4 (four) times daily.   CONTINUOUS BLOOD GLUC SENSOR (DEXCOM G6 SENSOR) MISC    1 each by Does not apply route once a week.   CONTINUOUS BLOOD GLUC TRANSMIT (DEXCOM G6 TRANSMITTER) MISC    1 each by Does not apply route 4 (four) times daily. Dx: E11.65   CYCLOBENZAPRINE (FLEXERIL) 10 MG TABLET    Take 1 tablet (10 mg total) by mouth at bedtime as needed for muscle spasms.   DIPHENHYDRAMINE (BENADRYL) 25 MG CAPSULE    Take 25 mg by mouth See admin instructions. To be given prior to Vancomycin administration due to rash (allergic reaction)   DULOXETINE (CYMBALTA) 60 MG CAPSULE    Take 1 capsule (60 mg total) by mouth daily.   FLUTICASONE (FLONASE) 50 MCG/ACT NASAL SPRAY    Place 1 spray into both nostrils 2 (two) times daily as needed for allergies or rhinitis.   GABAPENTIN (NEURONTIN) 600 MG TABLET    Take 1 tablet (600 mg total) by mouth at bedtime.   HYDROCODONE-ACETAMINOPHEN (NORCO) 10-325 MG TABLET    Take 1 tablet by mouth 2 (two) times daily as needed.   INSULIN GLARGINE, 2 UNIT DIAL, (TOUJEO  MAX SOLOSTAR) 300 UNIT/ML SOPN    Inject 40-50 Units into the skin daily.   METFORMIN (GLUCOPHAGE) 1000 MG TABLET    Take 1 tablet (1,000 mg total) by mouth 2 (two) times daily.   MULTIPLE VITAMINS-MINERALS (MULTIVITAMIN WITH MINERALS) TABLET    Take 1 tablet by mouth daily.   TERBINAFINE (LAMISIL AT) 1 % CREAM    Apply 1 application topically 2 (two) times daily.   ZINC GLUCONATE 50 MG TABLET    Take 50 mg by mouth daily.  Modified Medications   No medications on file  Discontinued Medications   CEFAZOLIN (ANCEF) 10 G INJECTION        History of Present Illness: I called and spoke with [name] today.  He feels like  he is improving slowly on therapy for recent MSSA bacteremia complicated by right ankle infection.  He continues to have a wound VAC dressing on his right ankle wound.  He says the wound is getting smaller and Dr. Nolen Mu is considering removing the VAC dressing next week.  His inflammatory markers have remained up recently.  He has not had any problems tolerating his PICC or cefazolin.  He has now completed 8 weeks of total antibiotic therapy.  Review of Systems  Constitutional: Negative for fever.  Gastrointestinal: Negative for abdominal pain, diarrhea, nausea and vomiting.  Musculoskeletal: Negative for joint pain.    Past Medical History:  Diagnosis Date  . Atrial fibrillation (HCC)   . Charcot ankle   . Hypertension   . Osteomyelitis of toe of right foot (HCC)   . Recurrent boils   . Type 2 diabetes mellitus with diabetic neuropathy [facility])    Diagnosed age 54    Social History   Tobacco Use  . Smoking status: Never Smoker  . Smokeless tobacco: Never Used  Substance Use Topics  . Alcohol use: No  . Drug use: No    Family History  Problem Relation Age of Onset  . Stroke Father   . Asthma Father   . COPD Father   . Aneurysm Father        AAA  . Diabetes Brother   . Stroke Brother        2008  . Diabetes Mother     No Known Allergies  Health Maintenance  Topic Date Due  . PNEUMOCOCCAL POLYSACCHARIDE VACCINE AGE 61-64 HIGH RISK  08/23/1966  . COLONOSCOPY  08/23/2014  . URINE MICROALBUMIN  01/31/2019  . COLON CANCER SCREENING ANNUAL FOBT  03/05/2019  . TETANUS/TDAP  03/07/2019  . HEMOGLOBIN A1C  04/22/2020  . OPHTHALMOLOGY EXAM  06/01/2020  . FOOT EXAM  07/21/2020  . INFLUENZA VACCINE  Completed  . Hepatitis C Screening  Completed  . HIV Screening  Completed    Observations/Objective:   Assessment and Plan: He is improving slowly on therapy for recent MSSA bacteremia and right ankle infection superimposed upon Charcot arthropathy.  Discontinue cefazolin  and have his PICC removed now.  I will switch him to oral cephalexin and follow-up with him in 2 weeks.  Follow Up Instructions: 1. Change IV cefazolin to oral cephalexin 2. Follow-up by video visit on 01/22/2020   I discussed the assessment and treatment plan with the patient. The patient was provided an opportunity to ask questions and all were answered. The patient agreed with the plan and demonstrated an understanding of the instructions.   The patient was advised to call back or seek an in-person evaluation if the symptoms worsen or if the  condition fails to improve as anticipated.  I provided 16 minutes of non-face-to-face time during this encounter.  Michel Bickers, MD [facility] for Infectious [place] [facility] [phone] pager   [phone] cell 01/06/2020, 2:46 PM

## 2020-01-08 ENCOUNTER — Other Ambulatory Visit: Payer: Self-pay

## 2020-01-08 ENCOUNTER — Ambulatory Visit (INDEPENDENT_AMBULATORY_CARE_PROVIDER_SITE_OTHER): Payer: Medicare Other

## 2020-01-08 DIAGNOSIS — Z4801 Encounter for change or removal of surgical wound dressing: Secondary | ICD-10-CM

## 2020-01-08 DIAGNOSIS — E1151 Type 2 diabetes mellitus with diabetic peripheral angiopathy without gangrene: Secondary | ICD-10-CM

## 2020-01-08 DIAGNOSIS — Z452 Encounter for adjustment and management of vascular access device: Secondary | ICD-10-CM

## 2020-01-08 DIAGNOSIS — E871 Hypo-osmolality and hyponatremia: Secondary | ICD-10-CM

## 2020-01-08 DIAGNOSIS — I4891 Unspecified atrial fibrillation: Secondary | ICD-10-CM

## 2020-01-08 DIAGNOSIS — B954 Other streptococcus as the cause of diseases classified elsewhere: Secondary | ICD-10-CM

## 2020-01-08 DIAGNOSIS — E785 Hyperlipidemia, unspecified: Secondary | ICD-10-CM

## 2020-01-08 DIAGNOSIS — G8929 Other chronic pain: Secondary | ICD-10-CM

## 2020-01-08 DIAGNOSIS — L03115 Cellulitis of right lower limb: Secondary | ICD-10-CM

## 2020-01-08 DIAGNOSIS — Z89421 Acquired absence of other right toe(s): Secondary | ICD-10-CM

## 2020-01-08 DIAGNOSIS — B9689 Other specified bacterial agents as the cause of diseases classified elsewhere: Secondary | ICD-10-CM

## 2020-01-08 DIAGNOSIS — E11621 Type 2 diabetes mellitus with foot ulcer: Secondary | ICD-10-CM | POA: Diagnosis not present

## 2020-01-08 DIAGNOSIS — Z794 Long term (current) use of insulin: Secondary | ICD-10-CM

## 2020-01-08 DIAGNOSIS — E1165 Type 2 diabetes mellitus with hyperglycemia: Secondary | ICD-10-CM

## 2020-01-08 DIAGNOSIS — L02415 Cutaneous abscess of right lower limb: Secondary | ICD-10-CM | POA: Diagnosis not present

## 2020-01-08 DIAGNOSIS — F329 Major depressive disorder, single episode, unspecified: Secondary | ICD-10-CM

## 2020-01-08 DIAGNOSIS — M86171 Other acute osteomyelitis, right ankle and foot: Secondary | ICD-10-CM

## 2020-01-08 DIAGNOSIS — I82431 Acute embolism and thrombosis of right popliteal vein: Secondary | ICD-10-CM

## 2020-01-08 DIAGNOSIS — L97411 Non-pressure chronic ulcer of right heel and midfoot limited to breakdown of skin: Secondary | ICD-10-CM

## 2020-01-08 DIAGNOSIS — Z4781 Encounter for orthopedic aftercare following surgical amputation: Secondary | ICD-10-CM

## 2020-01-08 DIAGNOSIS — E11610 Type 2 diabetes mellitus with diabetic neuropathic arthropathy: Secondary | ICD-10-CM

## 2020-01-08 DIAGNOSIS — E1169 Type 2 diabetes mellitus with other specified complication: Secondary | ICD-10-CM

## 2020-01-08 DIAGNOSIS — I10 Essential (primary) hypertension: Secondary | ICD-10-CM

## 2020-01-08 DIAGNOSIS — Z89512 Acquired absence of left leg below knee: Secondary | ICD-10-CM

## 2020-01-08 DIAGNOSIS — Z792 Long term (current) use of antibiotics: Secondary | ICD-10-CM

## 2020-01-09 ENCOUNTER — Telehealth: Payer: Self-pay

## 2020-01-09 NOTE — Telephone Encounter (Signed)
Patient called office to see if MD would like for him to start Cephalexin 500 mg; has not started oral antibiotics yet. Advised patient to go ahead and start oral antibiotics since picc has been removed. [place], [place]

## 2020-01-12 ENCOUNTER — Other Ambulatory Visit: Payer: Self-pay | Admitting: *Deleted

## 2020-01-12 MED ORDER — DEXCOM G6 SENSOR MISC: 1.0000 | 11 refills | Status: DC

## 2020-01-22 ENCOUNTER — Encounter: Payer: Self-pay | Admitting: Internal Medicine

## 2020-01-22 ENCOUNTER — Telehealth (INDEPENDENT_AMBULATORY_CARE_PROVIDER_SITE_OTHER): Payer: Medicare Other | Admitting: Internal Medicine

## 2020-01-22 ENCOUNTER — Other Ambulatory Visit: Payer: Self-pay

## 2020-01-22 DIAGNOSIS — B9561 Methicillin susceptible Staphylococcus aureus infection as the cause of diseases classified elsewhere: Secondary | ICD-10-CM | POA: Diagnosis not present

## 2020-01-22 DIAGNOSIS — R7881 Bacteremia: Secondary | ICD-10-CM

## 2020-01-22 DIAGNOSIS — L089 Local infection of the skin and subcutaneous tissue, unspecified: Secondary | ICD-10-CM

## 2020-01-22 DIAGNOSIS — E11628 Type 2 diabetes mellitus with other skin complications: Secondary | ICD-10-CM

## 2020-01-22 NOTE — Progress Notes (Signed)
Virtual Visit via Video Note  I connected with@ on 01/22/20 at  9:00 AM EDT by a video enabled telemedicine application and verified that I am speaking with the correct person using two identifiers.  Location: Patient: Home Provider: [facility]   I discussed the limitations of evaluation and management by telemedicine and the availability of in person appointments. The patient expressed understanding and agreed to proceed.  [facility] for Infectious Disease  Patient Active Problem List   Diagnosis Date Noted  . Abscess of tendon sheath, right ankle and foot 12/24/2019    Priority: High  . Bacteremia due to methicillin susceptible Staphylococcus aureus (MSSA) 12/24/2019    Priority: High  . Diabetic foot infection ([place]) 04/11/2017    Priority: High  . Osteomyelitis of toe of right foot ([place]) 06/13/2014    Priority: High  . Charcot ankle 03/09/2014    Priority: Medium  . Acute deep vein thrombosis (DVT) of right popliteal vein ([place]) 10/26/2019  . Uncontrolled type 2 diabetes mellitus with hyperglycemia, with long-term current use of insulin ([place]) 10/23/2019  . A-fib ([place]) 10/22/2019  . Lumbar spine scoliosis 08/13/2018  . S/P BKA (below knee amputation) unilateral, left ([place]) 01/12/2018  . Pain management contract signed 05/04/2017  . Peripheral edema 10/02/2016  . Polycythemia 12/09/2015  . Type 2 diabetes mellitus ([place]) 07/22/2015  . Neuropathy in diabetes ([place]) 03/31/2014  . Essential hypertension, benign 03/03/2013  . DDD (degenerative disc disease), lumbar 03/03/2013    Patient's Medications  New Prescriptions   No medications on file  Previous Medications   ALBUTEROL (PROVENTIL HFA;VENTOLIN HFA) 108 (90 BASE) MCG/ACT INHALER    Inhale 2 puffs into the lungs every 6 (six) hours as needed for wheezing or shortness of breath.   AMIODARONE (PACERONE) 200 MG TABLET    Take 1 tablet (200 mg total) by mouth daily.   APIXABAN (ELIQUIS) 5 MG TABS TABLET    Take 1 tablet (5 mg  total) by mouth 2 (two) times daily.   ATORVASTATIN (LIPITOR) 20 MG TABLET    Take 1 tablet (20 mg total) by mouth daily.   CEPHALEXIN (KEFLEX) 500 MG CAPSULE    Take 1 capsule (500 mg total) by mouth 3 (three) times daily.   CONTINUOUS BLOOD GLUC RECEIVER (DEXCOM G6 RECEIVER) DEVI    1 each by Does not apply route 4 (four) times daily.   CONTINUOUS BLOOD GLUC SENSOR (DEXCOM G6 SENSOR) MISC    1 each by Does not apply route once a week. Dx E11.65   CONTINUOUS BLOOD GLUC TRANSMIT (DEXCOM G6 TRANSMITTER) MISC    1 each by Does not apply route 4 (four) times daily. Dx: E11.65   CYCLOBENZAPRINE (FLEXERIL) 10 MG TABLET    Take 1 tablet (10 mg total) by mouth at bedtime as needed for muscle spasms.   DIPHENHYDRAMINE (BENADRYL) 25 MG CAPSULE    Take 25 mg by mouth See admin instructions. To be given prior to Vancomycin administration due to rash (allergic reaction)   DULOXETINE (CYMBALTA) 60 MG CAPSULE    Take 1 capsule (60 mg total) by mouth daily.   FLUTICASONE (FLONASE) 50 MCG/ACT NASAL SPRAY    Place 1 spray into both nostrils 2 (two) times daily as needed for allergies or rhinitis.   GABAPENTIN (NEURONTIN) 600 MG TABLET    Take 1 tablet (600 mg total) by mouth at bedtime.   HYDROCODONE-ACETAMINOPHEN (NORCO) 10-325 MG TABLET    Take 1 tablet by mouth 2 (two) times daily as needed.  INSULIN GLARGINE, 2 UNIT DIAL, (TOUJEO MAX SOLOSTAR) 300 UNIT/ML SOPN    Inject 40-50 Units into the skin daily.   METFORMIN (GLUCOPHAGE) 1000 MG TABLET    Take 1 tablet (1,000 mg total) by mouth 2 (two) times daily.   MULTIPLE VITAMINS-MINERALS (MULTIVITAMIN WITH MINERALS) TABLET    Take 1 tablet by mouth daily.   TERBINAFINE (LAMISIL AT) 1 % CREAM    Apply 1 application topically 2 (two) times daily.   ZINC GLUCONATE 50 MG TABLET    Take 50 mg by mouth daily.  Modified Medications   No medications on file  Discontinued Medications   No medications on file    History of Present Illness: I called and spoke with [name]  today.  He had recent MSSA bacteremia complicated by right ankle infection.  He completed 8 weeks of IV cefazolin therapy on 01/06/2020 before switching to oral cephalexin.  He has not had any problems tolerating his antibiotics.  His wound VAC was removed last week.  Review of Systems  Constitutional: Negative for fever.  Gastrointestinal: Negative for abdominal pain, diarrhea, nausea and vomiting.  Musculoskeletal: Negative for joint pain.    Past Medical History:  Diagnosis Date  . Atrial fibrillation (HCC)   . Charcot ankle   . Hypertension   . Osteomyelitis of toe of right foot (HCC)   . Recurrent boils   . Type 2 diabetes mellitus with diabetic neuropathy [facility])    Diagnosed age 37    Social History   Tobacco Use  . Smoking status: Never Smoker  . Smokeless tobacco: Never Used  Substance Use Topics  . Alcohol use: No  . Drug use: No    Family History  Problem Relation Age of Onset  . Stroke Father   . Asthma Father   . COPD Father   . Aneurysm Father        AAA  . Diabetes Brother   . Stroke Brother        2008  . Diabetes Mother     No Known Allergies  Health Maintenance  Topic Date Due  . PNEUMOCOCCAL POLYSACCHARIDE VACCINE AGE 65-64 HIGH RISK  Never done  . COLONOSCOPY  Never done  . URINE MICROALBUMIN  01/31/2019  . COLON CANCER SCREENING ANNUAL FOBT  03/05/2019  . TETANUS/TDAP  03/07/2019  . HEMOGLOBIN A1C  04/22/2020  . OPHTHALMOLOGY EXAM  06/01/2020  . FOOT EXAM  07/21/2020  . INFLUENZA VACCINE  Completed  . Hepatitis C Screening  Completed  . HIV Screening  Completed    Observations/Objective:   Assessment and Plan: I am hopeful that his MSSA ankle infection and bacteremia have been cured now following surgery and 10 weeks of antibiotic therapy.  He will stop cephalexin and follow-up in 4 weeks.  Follow Up Instructions: 1. Discontinue cephalexin 2. Follow-up in 4 weeks   I discussed the assessment and treatment plan with the patient. The  patient was provided an opportunity to ask questions and all were answered. The patient agreed with the plan and demonstrated an understanding of the instructions.   The patient was advised to call back or seek an in-person evaluation if the symptoms worsen or if the condition fails to improve as anticipated.  I provided 13 minutes of non-face-to-face time during this encounter.  Cliffton Asters, MD [facility] for Infectious Disease [facility] [phone] pager   [phone] cell 01/22/2020, 8:28 AM

## 2020-01-29 ENCOUNTER — Other Ambulatory Visit: Payer: Self-pay | Admitting: Family Medicine

## 2020-01-29 DIAGNOSIS — E1142 Type 2 diabetes mellitus with diabetic polyneuropathy: Secondary | ICD-10-CM

## 2020-02-04 ENCOUNTER — Telehealth: Payer: Self-pay

## 2020-02-04 NOTE — Telephone Encounter (Signed)
Left message to return call to office to schedule an appointment with Dr.Jenkins. Patient prefers Monday or Tuesday.

## 2020-02-17 ENCOUNTER — Encounter: Payer: Self-pay | Admitting: General Surgery

## 2020-02-17 ENCOUNTER — Other Ambulatory Visit: Payer: Self-pay

## 2020-02-17 ENCOUNTER — Ambulatory Visit (INDEPENDENT_AMBULATORY_CARE_PROVIDER_SITE_OTHER): Payer: Medicare Other | Admitting: General Surgery

## 2020-02-17 VITALS — BP 109/74 | HR 103 | Temp 98.0°F | Resp 12 | Ht 76.0 in | Wt 200.0 lb

## 2020-02-17 DIAGNOSIS — M869 Osteomyelitis, unspecified: Secondary | ICD-10-CM

## 2020-02-17 NOTE — Patient Instructions (Signed)
Leg Amputation        Leg amputation is a procedure to remove the leg. You may have this procedure if your leg has been affected by a disease, such as peripheral artery disease or severe circulation disease, and is causing problems. There are three types of leg amputation:  Above-the-knee amputation (transfemoral amputation). This type is done to remove the leg from above the knee.  Below-the-knee amputation (transtibial amputation). This type is done to remove the leg from below the knee.  Through-knee amputation. This type is done to remove the leg at the knee joint. Tell a health care provider about:  Any allergies you have.  All medicines you are taking, including vitamins, herbs, eye drops, creams, and over-the-counter medicines.  Any problems you or family members have had with anesthetic medicines.  Any blood disorders you have.  Any surgeries you have had.  Any medical conditions you have.  Whether you are pregnant or may be pregnant. What are the risks? Generally, this is a safe procedure. However, problems may occur, including:  Infection.  Bleeding.  Allergic reactions to medicines.  Stiffening of the hip and knee joint (hip and knee contracture).  Blood clot in a deep vein (deep vein thrombosis, or DVT), usually in the leg.  A blood clot in your lung (pulmonary embolism). What happens before the procedure? Medicines  Ask your health care provider about: ? Changing or stopping your regular medicines. This is especially important if you are taking diabetes medicines or blood thinners. ? Taking over-the-counter medicines, vitamins, herbs, and supplements. ? Taking medicines such as aspirin and ibuprofen. These medicines can thin your blood. Do not take these medicines unless your health care provider tells you to take them.  You may be given antibiotic medicine to help prevent an infection. If so, take the antibiotic as told by your health care  provider. General instructions  Ask your health care provider how your surgical site will be marked or identified.  Plan to have someone take you home from the hospital or clinic.  Plan to have a responsible adult care for you for at least 24 hours after you leave the hospital or clinic. This is important. What happens during the procedure?  To lower your risk of infection: ? Your health care team will wash or sanitize their hands. ? Hair may be removed from the surgical area. ? Your skin will be washed with soap.  An IV will be inserted into one of your veins.  You will be given one or more of the following: ? A medicine to help you relax (sedative). ? A medicine to numb the area (local anesthetic). ? A medicine to make you fall asleep (general anesthetic). ? A medicine that is injected into your spine to numb the area below and slightly above the injection site (spinal anesthetic). ? A medicine that is injected into an area of your body to numb everything below the injection site (regional anesthetic).  Damaged or diseased tissue and bone will be removed.  Uneven areas of bone will be smoothed.  Blood vessels and nerves will be closed off.  Muscles will be cut and reshaped so that an artificial leg (prosthesis) can be attached to the stump.  The incision will be closed with stitches (sutures).  A bandage (dressing) will be placed over the incision. The procedure may vary among health care providers and hospitals. What happens after the procedure?  Your blood pressure, heart rate, breathing rate, and blood oxygen  level will be monitored until the medicines you were given have worn off.  You will be given medicine for pain. The medicine may include: ? A sedative. ? A spinal anesthetic. ? A regional anesthetic.  You may start physical therapy within 24 hours after your surgery. Summary  Leg amputation is a procedure to remove the leg from above, below, or through the  knee.  Follow instructions from your health care provider about eating or drinking restrictions before the procedure.  Before the procedure, ask your health care provider how your surgical site will be marked or identified. This information is not intended to replace advice given to you by your health care provider. Make sure you discuss any questions you have with your health care provider. Document Revised: 07/28/2019 Document Reviewed: 01/31/2017 Elsevier Patient Education  [place].

## 2020-02-18 ENCOUNTER — Other Ambulatory Visit: Payer: Self-pay

## 2020-02-18 ENCOUNTER — Encounter (HOSPITAL_COMMUNITY): Payer: Self-pay

## 2020-02-18 ENCOUNTER — Other Ambulatory Visit (HOSPITAL_COMMUNITY)
Admission: RE | Admit: 2020-02-18 | Discharge: 2020-02-18 | Disposition: A | Discharge status: Home / Self Care | Payer: Medicare Other | Source: Ambulatory Visit | Attending: General Surgery | Admitting: General Surgery

## 2020-02-18 ENCOUNTER — Encounter (HOSPITAL_COMMUNITY)
Admission: RE | Admit: 2020-02-18 | Discharge: 2020-02-18 | Disposition: A | Discharge status: Home / Self Care | Payer: Medicare Other | Source: Ambulatory Visit | Attending: General Surgery | Admitting: General Surgery

## 2020-02-18 DIAGNOSIS — Z01818 Encounter for other preprocedural examination: Secondary | ICD-10-CM | POA: Insufficient documentation

## 2020-02-18 DIAGNOSIS — Z7901 Long term (current) use of anticoagulants: Secondary | ICD-10-CM | POA: Insufficient documentation

## 2020-02-18 DIAGNOSIS — Z794 Long term (current) use of insulin: Secondary | ICD-10-CM | POA: Insufficient documentation

## 2020-02-18 DIAGNOSIS — Z79899 Other long term (current) drug therapy: Secondary | ICD-10-CM | POA: Insufficient documentation

## 2020-02-18 DIAGNOSIS — Z833 Family history of diabetes mellitus: Secondary | ICD-10-CM | POA: Insufficient documentation

## 2020-02-18 DIAGNOSIS — I10 Essential (primary) hypertension: Secondary | ICD-10-CM | POA: Insufficient documentation

## 2020-02-18 DIAGNOSIS — Z20822 Contact with and (suspected) exposure to covid-19: Secondary | ICD-10-CM | POA: Insufficient documentation

## 2020-02-18 DIAGNOSIS — I4891 Unspecified atrial fibrillation: Secondary | ICD-10-CM | POA: Insufficient documentation

## 2020-02-18 DIAGNOSIS — E1169 Type 2 diabetes mellitus with other specified complication: Secondary | ICD-10-CM | POA: Insufficient documentation

## 2020-02-18 DIAGNOSIS — M869 Osteomyelitis, unspecified: Secondary | ICD-10-CM | POA: Insufficient documentation

## 2020-02-18 LAB — CBC WITH DIFFERENTIAL/PLATELET
Abs Immature Granulocytes: 0.07 10*3/uL (ref 0.00–0.07)
Basophils Absolute: 0 10*3/uL (ref 0.0–0.1)
Basophils Relative: 0 %
Eosinophils Absolute: 0 10*3/uL (ref 0.0–0.5)
Eosinophils Relative: 0 %
HCT: 33.3 % — ABNORMAL LOW (ref 39.0–52.0)
Hemoglobin: 10.7 g/dL — ABNORMAL LOW (ref 13.0–17.0)
Immature Granulocytes: 1 %
Lymphocytes Relative: 10 %
Lymphs Abs: 1 10*3/uL (ref 0.7–4.0)
MCH: 26.4 pg (ref 26.0–34.0)
MCHC: 32.1 g/dL (ref 30.0–36.0)
MCV: 82.2 fL (ref 80.0–100.0)
Monocytes Absolute: 0.8 10*3/uL (ref 0.1–1.0)
Monocytes Relative: 8 %
Neutro Abs: 8.1 10*3/uL — ABNORMAL HIGH (ref 1.7–7.7)
Neutrophils Relative %: 81 %
Platelets: 438 10*3/uL — ABNORMAL HIGH (ref 150–400)
RBC: 4.05 MIL/uL — ABNORMAL LOW (ref 4.22–5.81)
RDW: 13.8 % (ref 11.5–15.5)
WBC: 10.1 10*3/uL (ref 4.0–10.5)
nRBC: 0 % (ref 0.0–0.2)

## 2020-02-18 LAB — ABO/RH: ABO/RH(D): O NEG

## 2020-02-18 LAB — BASIC METABOLIC PANEL
Anion gap: 11 (ref 5–15)
BUN: 19 mg/dL (ref 6–20)
CO2: 26 mmol/L (ref 22–32)
Calcium: 8.9 mg/dL (ref 8.9–10.3)
Chloride: 91 mmol/L — ABNORMAL LOW (ref 98–111)
Creatinine, Ser: 0.84 mg/dL (ref 0.61–1.24)
GFR calc Af Amer: >60 mL/min (ref >60)
GFR calc non Af Amer: >60 mL/min (ref >60)
Glucose, Bld: 314 mg/dL — ABNORMAL HIGH (ref 70–99)
Potassium: 4.1 mmol/L (ref 3.5–5.1)
Sodium: 128 mmol/L — ABNORMAL LOW (ref 135–145)

## 2020-02-18 LAB — SARS CORONAVIRUS 2 (TAT 6-24 HRS): SARS Coronavirus 2: NEGATIVE

## 2020-02-18 LAB — PREPARE RBC (CROSSMATCH)

## 2020-02-18 MED ORDER — CEFAZOLIN SODIUM-DEXTROSE 2-4 GM/100ML-% IV SOLN: 2.0000 g | INTRAVENOUS | Status: DC

## 2020-02-18 NOTE — Progress Notes (Signed)
[name]; [id number]; 03-21-1966   HPI Patient is a 54 year old white male who was referred to my care by Dr. Warrick Parisian and Dr. [name] of podiatry for evaluation treatment of right Charcot's foot and osteomyelitis of the right foot.  Patient has had a long course of surgery and antibiotic treatment for severe infection of the right foot.  Dr. [name] has performed the surgery and feels that at this point, patient needs a right below the knee amputation.  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  Patient is being followed by infectious disease.  He has open wounds in the ankle region of the right foot which is being dressed by himself and a home health nurse.  He has developed recent swelling along the right lower aspect of the leg with drainage into an open wound along the lateral malleolus.  He denies any fever or chills.  His blood sugars have been running in the 200s.  He does have insulin-dependent diabetes mellitus.  He currently has 0 out of 10 pain.  He does use a wheelchair to ambulate.  He does have a prosthesis of the left leg.  Patient is on Eliquis for intermittent proximal atrial fibrillation.  He states he is currently in normal sinus.  He has been off his Eliquis for the past 2 days. Past Medical History:  Diagnosis Date  . Atrial fibrillation ([place])   . Charcot ankle   . Hypertension   . Osteomyelitis of toe of right foot ([place])   . Recurrent boils   . Type 2 diabetes mellitus with diabetic neuropathy [facility])    Diagnosed age 56    Past Surgical History:  Procedure Laterality Date  . AMPUTATION Right 06/17/2014   Procedure: PARTIAL AMPUTATION RIGHT 3RD TOE;  Surgeon: Marcheta Grammes, DPM;  Location: [facility];  Service: Podiatry;  Laterality: Right;  . AMPUTATION Right 09/02/2014   Procedure: PARTIAL AMPUTATION 2ND TOE RIGHT FOOT;  Surgeon: Marcheta Grammes, DPM;  Location: [facility];  Service: Podiatry;  Laterality: Right;  .  AMPUTATION Right 04/01/2015   Procedure: AMPUTATION DIGIT 1ST TOE RIGHT FOOT;  Surgeon: Juanita Laster, DPM;  Location: [facility];  Service: Podiatry;  Laterality: Right;  . AMPUTATION Right 06/29/2016   pinkeye   . AMPUTATION Right 10/24/2019   Procedure: PARTIAL THIRD RAY AMPUTATION;  Surgeon: [name], DPM;  Location: [facility];  Service: Podiatry;  Laterality: Right;  . FOOT AMPUTATION Left   . FOOT SURGERY Left 03/2017  . HERNIA REPAIR Bilateral    and 3rd hernia repair does not remember ehat side  . IRRIGATION AND DEBRIDEMENT ABSCESS Right 11/05/2019   Procedure: IRRIGATION AND DRAINAGE ABSCESS RIGHT ANKLE;  Surgeon: [name], DPM;  Location: [facility];  Service: Podiatry;  Laterality: Right;  . TEE WITHOUT CARDIOVERSION N/A 10/29/2019   Procedure: TRANSESOPHAGEAL ECHOCARDIOGRAM (TEE) WITH PROPOFOL;  Surgeon: Fay Records, MD;  Location: [facility];  Service: Cardiovascular;  Laterality: N/A;    Family History  Problem Relation Age of Onset  . Stroke Father   . Asthma Father   . COPD Father   . Aneurysm Father        AAA  . Diabetes Brother   . Stroke Brother        2008  . Diabetes Mother     Current Outpatient Medications on File Prior to Visit  Medication Sig Dispense Refill  . albuterol (PROVENTIL HFA;VENTOLIN HFA) 108 (90  Base) MCG/ACT inhaler Inhale 2 puffs into the lungs every 6 (six) hours as needed for wheezing or shortness of breath. 1 Inhaler 0  . amiodarone (PACERONE) 200 MG tablet Take 1 tablet (200 mg total) by mouth daily. 60 tablet 1  . apixaban (ELIQUIS) 5 MG TABS tablet Take 1 tablet (5 mg total) by mouth 2 (two) times daily. 60 tablet 11  . atorvastatin (LIPITOR) 20 MG tablet Take 1 tablet (20 mg total) by mouth daily. 90 tablet 3  . cephALEXin (KEFLEX) 500 MG capsule Take 1 capsule (500 mg total) by mouth 3 (three) times daily. 90 capsule 0  . Continuous Blood Gluc Receiver (DEXCOM G6 RECEIVER) DEVI 1 each by Does not apply route 4 (four) times  daily. 1 each 3  . Continuous Blood Gluc Sensor (DEXCOM G6 SENSOR) MISC 1 each by Does not apply route once a week. Dx E11.65 4 each 11  . Continuous Blood Gluc Transmit (DEXCOM G6 TRANSMITTER) MISC 1 each by Does not apply route 4 (four) times daily. Dx: E11.65 1 each 3  . diphenhydrAMINE (BENADRYL) 25 mg capsule Take 25 mg by mouth See admin instructions. To be given prior to Vancomycin administration due to rash (allergic reaction)    . fluticasone (FLONASE) 50 MCG/ACT nasal spray Place 1 spray into both nostrils 2 (two) times daily as needed for allergies or rhinitis. 16 g 6  . gabapentin (NEURONTIN) 600 MG tablet Take 1 tablet (600 mg total) by mouth at bedtime. 90 tablet 3  . HYDROcodone-acetaminophen (NORCO) 10-325 MG tablet Take 1 tablet by mouth 2 (two) times daily as needed. 60 tablet 0  . Insulin Glargine, 2 Unit Dial, (TOUJEO MAX SOLOSTAR) 300 UNIT/ML SOPN Inject 40-50 Units into the skin daily. 5 pen 5  . metFORMIN (GLUCOPHAGE) 1000 MG tablet Take 1 tablet (1,000 mg total) by mouth 2 (two) times daily. 180 tablet 3  . Multiple Vitamins-Minerals (MULTIVITAMIN WITH MINERALS) tablet Take 1 tablet by mouth daily.    Marland Kitchen zinc gluconate 50 MG tablet Take 50 mg by mouth daily.     No current facility-administered medications on file prior to visit.    No Known Allergies  Social History   Substance and Sexual Activity  Alcohol Use No    Social History   Tobacco Use  Smoking Status Never Smoker  Smokeless Tobacco Never Used    Review of Systems  Constitutional: Negative.   HENT: Negative.   Eyes: Negative.   Respiratory: Negative.   Cardiovascular: Negative.   Gastrointestinal: Negative.   Genitourinary: Negative.   Skin: Negative.   Neurological: Negative.   Endo/Heme/Allergies: Bruises/bleeds easily.  Psychiatric/Behavioral: Negative.     Objective   Vitals:   02/17/20 1524  BP: 109/74  Pulse: (!) 103  Resp: 12  Temp: 98 F (36.7 C)  SpO2: 96%    Physical  Exam Vitals reviewed.  Constitutional:      Appearance: Normal appearance. He is not ill-appearing.  HENT:     Head: Normocephalic and atraumatic.  Cardiovascular:     Rate and Rhythm: Normal rate and regular rhythm.     Heart sounds: Normal heart sounds. No murmur. No friction rub. No gallop.   Pulmonary:     Effort: Pulmonary effort is normal. No respiratory distress.     Breath sounds: Normal breath sounds. No stridor. No wheezing, rhonchi or rales.  Musculoskeletal:     Comments: Left BKA present.  Right foot swollen with open draining wounds along the heel and  the medial and lateral malleolus of the ankle.  There is soft tissue fluctuance extending up the right lateral aspect of the lower extremity.  Some purple skin changes noted in this region.  I was able to express necrotic bloody fluid through the lateral malleolus wound.  Right femoral pulse palpable.  Skin:    General: Skin is warm and dry.  Neurological:     Mental Status: He is alert and oriented to person, place, and time.     Gait: Gait abnormal.    Previous hospitalization discharge summary reviewed  Assessment  Osteomyelitis of right foot with worsening gangrenous changes, chronic anticoagulation due to intermittent atrial fibrillation, Charcot's disease of the right foot Plan   Patient has failed medical therapy and does need a right below the knee amputation.  I told the patient and wife that there is a high risk of wound breakdown of the right BKA and should this occur, this may need to be converted to an above-the-knee amputation.  The risks of the surgery including bleeding, infection, the possibility of a blood transfusion, poor wound healing, and conversion to an above-the-knee amputation were fully explained to the patient, who gave informed consent.  Patient will be scheduled for surgery on 02/19/2020.  

## 2020-02-18 NOTE — Patient Instructions (Signed)
[name]  02/18/2020     @   Your procedure is scheduled on  02/19/2020   Report to [facility] at  1100  A.M.  Call this number if you have problems the morning of surgery:  [phone]   Remember:  Do not eat or drink after midnight.                        Take these medicines the morning of surgery with A SIP OF WATER pacerone, hydrocodone(if needed). Take 1/2 of your night time insulin the night before your surgery. DO NOT take any medications for diabetes the morning of your procedure.      Do not wear jewelry, make-up or nail polish.  Do not wear lotions, powders, or perfumes. Please wear deodorant and brush your teeth.  Do not shave 48 hours prior to surgery.  Men may shave face and neck.  Do not bring valuables to the hospital.  [facility] is not responsible for any belongings or valuables.  Contacts, dentures or bridgework may not be worn into surgery.  Leave your suitcase in the car.  After surgery it may be brought to your room.  For patients admitted to the hospital, discharge time will be determined by your treatment team.  Patients discharged the day of surgery will not be allowed to drive home.   Name and phone number of your driver:   Family    Special instructions:  DO NOT smoke the morning of your procedure.  Please read over the following fact sheets that you were given. Pain Booklet, Coughing and Deep Breathing, Blood Transfusion Information, Surgical Site Infection Prevention, Anesthesia Post-op Instructions and Care and Recovery After Surgery       Leg Amputation, Care After This sheet gives you information about how to care for yourself after your procedure. Your health care provider may also give you more specific instructions. If you have problems or questions, contact your health care provider. What can I expect after the procedure? After the procedure, it is common to have:  A little blood or fluid coming from  your incision.  Pain from your incision.  Pain that feels like it is coming from the leg that has been removed (phantom pain). This can last for a year or longer.  Skin breakdown on your stump (residual limb).  Feelings of depression, anxiety, and fear. Follow these instructions at home: Medicines  Take over-the-counter and prescription medicines only as told by your health care provider.  If you were prescribed an antibiotic medicine, take it as told by your health care provider. Do not stop taking the antibiotic even if you start to feel better. Bathing  Do not take baths, swim, use a hot tub, or get your residual limb wet until your health care provider approves. You may only be allowed to take sponge baths.  Ask your health care provider when you may start taking showers. After taking a shower, make sure to rinse and dry your residual limb carefully. Incision care   Check your residual limb, especially your incision area, every day. Check for: ? More redness, swelling, or pain. ? More fluid or blood. ? Warmth. ? Pus or a bad smell. ? Blisters. ? Scrapes.  Follow instructions from your health care provider about how to take care of your incision. Make sure you: ? Wash your hands with soap and water before you change  your bandage (dressing). If soap and water are not available, use hand sanitizer. ? Change your dressing as told by your health care provider. ? Leave stitches (sutures), skin glue, or adhesive strips in place. These skin closures may need to stay in place for 2 weeks or longer. If adhesive strip edges start to loosen and curl up, you may trim the loose edges. Do not remove adhesive strips completely unless your health care provider tells you to do that. Activity  Return to your normal activities as told by your health care provider. Ask your health care provider what activities are safe for you.  Do physical therapy exercises as told by your health care  provider.  If you have been fitted with an artificial leg (prosthesis) or have been given crutches, use them as told by your health care provider. Eating and drinking  Eat a healthy diet that includes whole grains, fruits and vegetables, low-fat dairy products, and lean proteins.  Drink enough fluid to keep your urine pale yellow. Driving  Work with an occupational therapist to learn new strategies for safe driving with an amputation.  Do not drive or use heavy equipment while taking prescription pain medicine. General instructions  To prevent or treat constipation while you are taking prescription pain medicine, your health care provider may recommend that you: ? Drink enough fluid to keep your urine pale yellow. ? Take over-the-counter or prescription medicines. ? Eat foods that are high in fiber, such as fresh fruits and vegetables, whole grains, and beans. ? Limit foods that are high in fat and processed sugars, such as fried and sweet foods.  Do not use oils, lotion, cream, or rubbing alcohol on the remaining part of your leg.  Wear compression stockings as told by your health care provider.  If you have trouble coping with your amputation, contact your health care provider. Some feelings of depression, anxiety, or fear are normal after an amputation, but if you struggle with these feelings or if they get overwhelming, your provider may be able to recommend a therapist or support group to help you.  Do not use any products that contain nicotine or tobacco, such as cigarettes and e-cigarettes. These can delay bone healing. If you need help quitting, ask your health care provider.  Keep all follow-up visits as told by your health care provider. This is important. Contact a health care provider if:  You have a fever.  You have more tenderness in your residual limb.  You have a rash or itchy skin.  You have a cough or chills and you feel achy and weak.  You have trouble  coping with your amputation.  You have blisters or scrapes on your residual limb. Get help right away if:  You have severe pain in your residual limb.  You have more redness, swelling, or pain around your incision.  You have more fluid or blood coming from your incision.  Your incision feels warm to the touch, tender, and painful.  You have pus or a bad smell coming from your incision.  You feel light-headed and have shortness of breath.  You have blood-soaked bandages.  You cough up blood.  You have chest pain or pain when taking a deep breath or coughing. If you have these symptoms, do not drive yourself to the hospital. Call emergency services right away. If you ever feel like you may hurt yourself or others, or have thoughts about taking your own life, get help right away. You can  go to your nearest emergency department or call:  Your local emergency services (911 in the [place]  A suicide crisis helpline, such as the National Suicide Prevention Lifeline at (909)430-6040. This is open 24 hours a day. Summary  After a leg amputation, you may have pain that feels like it is coming from the leg that was removed (phantom pain). This can last for a year or longer.  Follow instructions from your health care provider about how to take care of your incision.  Check your residual limb, especially your incision area, every day. More redness, swelling, or pain may be a sign of infection.  Contact your health care provider if you have trouble coping with your amputation. This information is not intended to replace advice given to you by your health care provider. Make sure you discuss any questions you have with your health care provider. Document Revised: 01/31/2017 Document Reviewed: 01/31/2017 Elsevier Patient Education  2020 [employer]  General Anesthesia, Adult, Care After This sheet gives you information about how to care for yourself after your procedure. Your health care  provider may also give you more specific instructions. If you have problems or questions, contact your health care provider. What can I expect after the procedure? After the procedure, the following side effects are common:  Pain or discomfort at the IV site.  Nausea.  Vomiting.  Sore throat.  Trouble concentrating.  Feeling cold or chills.  Weak or tired.  Sleepiness and fatigue.  Soreness and body aches. These side effects can affect parts of the body that were not involved in surgery. Follow these instructions at home:  For at least 24 hours after the procedure:  Have a responsible adult stay with you. It is important to have someone help care for you until you are awake and alert.  Rest as needed.  Do not: ? Participate in activities in which you could fall or become injured. ? Drive. ? Use heavy machinery. ? Drink alcohol. ? Take sleeping pills or medicines that cause drowsiness. ? Make important decisions or sign legal documents. ? Take care of children on your own. Eating and drinking  Follow any instructions from your health care provider about eating or drinking restrictions.  When you feel hungry, start by eating small amounts of foods that are soft and easy to digest (bland), such as toast. Gradually return to your regular diet.  Drink enough fluid to keep your urine pale yellow.  If you vomit, rehydrate by drinking water, juice, or clear broth. General instructions  If you have sleep apnea, surgery and certain medicines can increase your risk for breathing problems. Follow instructions from your health care provider about wearing your sleep device: ? Anytime you are sleeping, including during daytime naps. ? While taking prescription pain medicines, sleeping medicines, or medicines that make you drowsy.  Return to your normal activities as told by your health care provider. Ask your health care provider what activities are safe for you.  Take  over-the-counter and prescription medicines only as told by your health care provider.  If you smoke, do not smoke without supervision.  Keep all follow-up visits as told by your health care provider. This is important. Contact a health care provider if:  You have nausea or vomiting that does not get better with medicine.  You cannot eat or drink without vomiting.  You have pain that does not get better with medicine.  You are unable to pass urine.  You develop a skin  rash.  You have a fever.  You have redness around your IV site that gets worse. Get help right away if:  You have difficulty breathing.  You have chest pain.  You have blood in your urine or stool, or you vomit blood. Summary  After the procedure, it is common to have a sore throat or nausea. It is also common to feel tired.  Have a responsible adult stay with you for the first 24 hours after general anesthesia. It is important to have someone help care for you until you are awake and alert.  When you feel hungry, start by eating small amounts of foods that are soft and easy to digest (bland), such as toast. Gradually return to your regular diet.  Drink enough fluid to keep your urine pale yellow.  Return to your normal activities as told by your health care provider. Ask your health care provider what activities are safe for you. This information is not intended to replace advice given to you by your health care provider. Make sure you discuss any questions you have with your health care provider. Document Revised: 10/26/2017 Document Reviewed: 06/08/2017 Elsevier Patient Education  [place]. How to Use Chlorhexidine for Bathing Chlorhexidine gluconate (CHG) is a germ-killing (antiseptic) solution that is used to clean the skin. It can get rid of the bacteria that normally live on the skin and can keep them away for about 24 hours. To clean your skin with CHG, you may be given:  A CHG solution to  use in the shower or as part of a sponge bath.  A prepackaged cloth that contains CHG. Cleaning your skin with CHG may help lower the risk for infection:  While you are staying in the intensive care unit of the hospital.  If you have a vascular access, such as a central line, to provide short-term or long-term access to your veins.  If you have a catheter to drain urine from your bladder.  If you are on a ventilator. A ventilator is a machine that helps you breathe by moving air in and out of your lungs.  After surgery. What are the risks? Risks of using CHG include:  A skin reaction.  Hearing loss, if CHG gets in your ears.  Eye injury, if CHG gets in your eyes and is not rinsed out.  The CHG product catching fire. Make sure that you avoid smoking and flames after applying CHG to your skin. Do not use CHG:  If you have a chlorhexidine allergy or have previously reacted to chlorhexidine.  On babies younger than 72 months of age. How to use CHG solution  Use CHG only as told by your health care provider, and follow the instructions on the label.  Use the full amount of CHG as directed. Usually, this is one bottle. During a shower Follow these steps when using CHG solution during a shower (unless your health care provider gives you different instructions): 1. Start the shower. 2. Use your normal soap and shampoo to wash your face and hair. 3. Turn off the shower or move out of the shower stream. 4. Pour the CHG onto a clean washcloth. Do not use any type of brush or rough-edged sponge. 5. Starting at your neck, lather your body down to your toes. Make sure you follow these instructions: ? If you will be having surgery, pay special attention to the part of your body where you will be having surgery. Scrub this area for at  least 1 minute. ? Do not use CHG on your head or face. If the solution gets into your ears or eyes, rinse them well with water. ? Avoid your genital  area. ? Avoid any areas of skin that have broken skin, cuts, or scrapes. ? Scrub your back and under your arms. Make sure to wash skin folds. 6. Let the lather sit on your skin for 1-2 minutes or as long as told by your health care provider. 7. Thoroughly rinse your entire body in the shower. Make sure that all body creases and crevices are rinsed well. 8. Dry off with a clean towel. Do not put any substances on your body afterward--such as powder, lotion, or perfume--unless you are told to do so by your health care provider. Only use lotions that are recommended by the manufacturer. 9. Put on clean clothes or pajamas. 10. If it is the night before your surgery, sleep in clean sheets.  During a sponge bath Follow these steps when using CHG solution during a sponge bath (unless your health care provider gives you different instructions): 1. Use your normal soap and shampoo to wash your face and hair. 2. Pour the CHG onto a clean washcloth. 3. Starting at your neck, lather your body down to your toes. Make sure you follow these instructions: ? If you will be having surgery, pay special attention to the part of your body where you will be having surgery. Scrub this area for at least 1 minute. ? Do not use CHG on your head or face. If the solution gets into your ears or eyes, rinse them well with water. ? Avoid your genital area. ? Avoid any areas of skin that have broken skin, cuts, or scrapes. ? Scrub your back and under your arms. Make sure to wash skin folds. 4. Let the lather sit on your skin for 1-2 minutes or as long as told by your health care provider. 5. Using a different clean, wet washcloth, thoroughly rinse your entire body. Make sure that all body creases and crevices are rinsed well. 6. Dry off with a clean towel. Do not put any substances on your body afterward--such as powder, lotion, or perfume--unless you are told to do so by your health care provider. Only use lotions that are  recommended by the manufacturer. 7. Put on clean clothes or pajamas. 8. If it is the night before your surgery, sleep in clean sheets. How to use CHG prepackaged cloths  Only use CHG cloths as told by your health care provider, and follow the instructions on the label.  Use the CHG cloth on clean, dry skin.  Do not use the CHG cloth on your head or face unless your health care provider tells you to.  When washing with the CHG cloth: ? Avoid your genital area. ? Avoid any areas of skin that have broken skin, cuts, or scrapes. Before surgery Follow these steps when using a CHG cloth to clean before surgery (unless your health care provider gives you different instructions): 1. Using the CHG cloth, vigorously scrub the part of your body where you will be having surgery. Scrub using a back-and-forth motion for 3 minutes. The area on your body should be completely wet with CHG when you are done scrubbing. 2. Do not rinse. Discard the cloth and let the area air-dry. Do not put any substances on the area afterward, such as powder, lotion, or perfume. 3. Put on clean clothes or pajamas. 4. If it is  the night before your surgery, sleep in clean sheets.  For general bathing Follow these steps when using CHG cloths for general bathing (unless your health care provider gives you different instructions). 1. Use a separate CHG cloth for each area of your body. Make sure you wash between any folds of skin and between your fingers and toes. Wash your body in the following order, switching to a new cloth after each step: ? The front of your neck, shoulders, and chest. ? Both of your arms, under your arms, and your hands. ? Your stomach and groin area, avoiding the genitals. ? Your right leg and foot. ? Your left leg and foot. ? The back of your neck, your back, and your buttocks. 2. Do not rinse. Discard the cloth and let the area air-dry. Do not put any substances on your body afterward--such as  powder, lotion, or perfume--unless you are told to do so by your health care provider. Only use lotions that are recommended by the manufacturer. 3. Put on clean clothes or pajamas. Contact a health care provider if:  Your skin gets irritated after scrubbing.  You have questions about using your solution or cloth. Get help right away if:  Your eyes become very red or swollen.  Your eyes itch badly.  Your skin itches badly and is red or swollen.  Your hearing changes.  You have trouble seeing.  You have swelling or tingling in your mouth or throat.  You have trouble breathing.  You swallow any chlorhexidine. Summary  Chlorhexidine gluconate (CHG) is a germ-killing (antiseptic) solution that is used to clean the skin. Cleaning your skin with CHG may help to lower your risk for infection.  You may be given CHG to use for bathing. It may be in a bottle or in a prepackaged cloth to use on your skin. Carefully follow your health care provider's instructions and the instructions on the product label.  Do not use CHG if you have a chlorhexidine allergy.  Contact your health care provider if your skin gets irritated after scrubbing. This information is not intended to replace advice given to you by your health care provider. Make sure you discuss any questions you have with your health care provider. Document Revised: 01/09/2019 Document Reviewed: 09/20/2017 Elsevier Patient Education  2020 [employer].

## 2020-02-18 NOTE — Anesthesia Preprocedure Evaluation (Addendum)
Anesthesia Evaluation  Patient identified by MRN, date of birth, ID band Patient awake    Reviewed: Allergy & Precautions, NPO status , Patient's Chart, lab work & pertinent test results  Airway Mallampati: II  TM Distance: >3 FB Neck ROM: Full    Dental  (+) Teeth Intact, Dental Advisory Given   Pulmonary    breath sounds clear to auscultation       Cardiovascular hypertension, Pt. on medications + DVT  Normal cardiovascular exam+ dysrhythmias Atrial Fibrillation  Rhythm:Regular Rate:Normal  18-Feb-2020 11:12:28 [facility] ROUTINE RECORD Normal sinus rhythm Possible Inferior infarct , age undetermined Abnormal ECG Confirmed by Quay Burow [id number]) on 02/18/2020 1:15:42 PM IMPRESSIONS    1. Left ventricular ejection fraction, by visual estimation, is 60 to  65%. The left ventricle has normal function. There is no left ventricular  hypertrophy.  2. The left ventricle has no regional wall motion abnormalities.  3. Global right ventricle has normal systolic function.The right  ventricular size is normal. Right vetricular wall thickness was not  assessed.  4. Left atrial size was normal.  5. Right atrial size was normal.  6. The mitral valve is normal in structure. No evidence of mitral valve  regurgitation.  7. The tricuspid valve is normal in structure.  8. The aortic valve is normal in structure. Aortic valve regurgitation is  not visualized.  9. The pulmonic valve was normal in structure. Pulmonic valve  regurgitation is trivial.    Neuro/Psych  Neuromuscular disease (diabetic neuropathy)    GI/Hepatic negative GI ROS,   Endo/Other  diabetes, Poorly Controlled, Type 2, Insulin Dependent  Renal/GU Renal disease (hyponatremia )     Musculoskeletal  (+) Arthritis ,   Abdominal   Peds  Hematology   Anesthesia Other Findings   Reproductive/Obstetrics                             Anesthesia Physical Anesthesia Plan  ASA: IV  Anesthesia Plan: General   Post-op Pain Management:    Induction: Intravenous  PONV Risk Score and Plan: 3 and Ondansetron, Midazolam and Metaclopromide  Airway Management Planned: Oral ETT  Additional Equipment:   Intra-op Plan:   Post-operative Plan: Extubation in OR  Informed Consent: I have reviewed the patients History and Physical, chart, labs and discussed the procedure including the risks, benefits and alternatives for the proposed anesthesia with the patient or authorized representative who has indicated his/her understanding and acceptance.     Dental advisory given  Plan Discussed with: CRNA and Surgeon  Anesthesia Plan Comments:                                         Anesthesia Evaluation  Patient identified by MRN, date of birth, ID band Patient awake    Reviewed: Allergy & Precautions, NPO status , Patient's Chart, lab work & pertinent test results, reviewed documented beta blocker date and time   Airway Mallampati: I  TM Distance: >3 FB Neck ROM: Full    Dental no notable dental hx. (+) Teeth Intact   Pulmonary neg pulmonary ROS,    Pulmonary exam normal breath sounds clear to auscultation       Cardiovascular Exercise Tolerance: Good hypertension, Pt. on medications Normal cardiovascular exam+ dysrhythmias Atrial Fibrillation I Rhythm:Regular Rate:Normal  Recent Afib with RVR -now sinus  Had bad infection -here for TEE to R/O vegetations  Appears improved from last OR visit for I&D 3rd toe   Neuro/Psych negative neurological ROS  negative psych ROS   GI/Hepatic negative GI ROS, Neg liver ROS,   Endo/Other  negative endocrine ROSdiabetes, Type 1, Insulin Dependent  Renal/GU negative Renal ROS  negative genitourinary   Musculoskeletal  (+) Arthritis , Osteoarthritis,    Abdominal   Peds negative pediatric ROS (+)   Hematology negative hematology ROS (+)   Anesthesia Other Findings   Reproductive/Obstetrics negative OB ROS                             Anesthesia Physical Anesthesia Plan  ASA: III  Anesthesia Plan: General   Post-op Pain Management:    Induction: Intravenous  PONV Risk Score and Plan: 2 and Propofol infusion, TIVA and Treatment may vary due to age or medical condition  Airway Management Planned: Nasal Cannula and Simple Face Mask  Additional Equipment:   Intra-op Plan:   Post-operative Plan:   Informed Consent: I have reviewed the patients History and Physical, chart, labs and discussed the procedure including the risks, benefits and alternatives for the proposed anesthesia with the patient or authorized representative who has indicated his/her understanding and acceptance.     Dental advisory given  Plan Discussed with: CRNA  Anesthesia Plan Comments: (Plan Full PPE use  Plan GA with GETA as needed d/w pt -WTP with same after Q&A)        Anesthesia Quick Evaluation  Anesthesia Quick Evaluation

## 2020-02-18 NOTE — H&P (Signed)
[name]; [id number]; 03-21-1966   HPI Patient is a 54 year old white male who was referred to my care by Dr. Warrick Parisian and Dr. [name] of podiatry for evaluation treatment of right Charcot's foot and osteomyelitis of the right foot.  Patient has had a long course of surgery and antibiotic treatment for severe infection of the right foot.  Dr. [name] has performed the surgery and feels that at this point, patient needs a right below the knee amputation.  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  Patient is being followed by infectious disease.  He has open wounds in the ankle region of the right foot which is being dressed by himself and a home health nurse.  He has developed recent swelling along the right lower aspect of the leg with drainage into an open wound along the lateral malleolus.  He denies any fever or chills.  His blood sugars have been running in the 200s.  He does have insulin-dependent diabetes mellitus.  He currently has 0 out of 10 pain.  He does use a wheelchair to ambulate.  He does have a prosthesis of the left leg.  Patient is on Eliquis for intermittent proximal atrial fibrillation.  He states he is currently in normal sinus.  He has been off his Eliquis for the past 2 days. Past Medical History:  Diagnosis Date  . Atrial fibrillation ([place])   . Charcot ankle   . Hypertension   . Osteomyelitis of toe of right foot ([place])   . Recurrent boils   . Type 2 diabetes mellitus with diabetic neuropathy [facility])    Diagnosed age 56    Past Surgical History:  Procedure Laterality Date  . AMPUTATION Right 06/17/2014   Procedure: PARTIAL AMPUTATION RIGHT 3RD TOE;  Surgeon: Marcheta Grammes, DPM;  Location: [facility];  Service: Podiatry;  Laterality: Right;  . AMPUTATION Right 09/02/2014   Procedure: PARTIAL AMPUTATION 2ND TOE RIGHT FOOT;  Surgeon: Marcheta Grammes, DPM;  Location: [facility];  Service: Podiatry;  Laterality: Right;  .  AMPUTATION Right 04/01/2015   Procedure: AMPUTATION DIGIT 1ST TOE RIGHT FOOT;  Surgeon: Juanita Laster, DPM;  Location: [facility];  Service: Podiatry;  Laterality: Right;  . AMPUTATION Right 06/29/2016   pinkeye   . AMPUTATION Right 10/24/2019   Procedure: PARTIAL THIRD RAY AMPUTATION;  Surgeon: [name], DPM;  Location: [facility];  Service: Podiatry;  Laterality: Right;  . FOOT AMPUTATION Left   . FOOT SURGERY Left 03/2017  . HERNIA REPAIR Bilateral    and 3rd hernia repair does not remember ehat side  . IRRIGATION AND DEBRIDEMENT ABSCESS Right 11/05/2019   Procedure: IRRIGATION AND DRAINAGE ABSCESS RIGHT ANKLE;  Surgeon: [name], DPM;  Location: [facility];  Service: Podiatry;  Laterality: Right;  . TEE WITHOUT CARDIOVERSION N/A 10/29/2019   Procedure: TRANSESOPHAGEAL ECHOCARDIOGRAM (TEE) WITH PROPOFOL;  Surgeon: Fay Records, MD;  Location: [facility];  Service: Cardiovascular;  Laterality: N/A;    Family History  Problem Relation Age of Onset  . Stroke Father   . Asthma Father   . COPD Father   . Aneurysm Father        AAA  . Diabetes Brother   . Stroke Brother        2008  . Diabetes Mother     Current Outpatient Medications on File Prior to Visit  Medication Sig Dispense Refill  . albuterol (PROVENTIL HFA;VENTOLIN HFA) 108 (90  Base) MCG/ACT inhaler Inhale 2 puffs into the lungs every 6 (six) hours as needed for wheezing or shortness of breath. 1 Inhaler 0  . amiodarone (PACERONE) 200 MG tablet Take 1 tablet (200 mg total) by mouth daily. 60 tablet 1  . apixaban (ELIQUIS) 5 MG TABS tablet Take 1 tablet (5 mg total) by mouth 2 (two) times daily. 60 tablet 11  . atorvastatin (LIPITOR) 20 MG tablet Take 1 tablet (20 mg total) by mouth daily. 90 tablet 3  . cephALEXin (KEFLEX) 500 MG capsule Take 1 capsule (500 mg total) by mouth 3 (three) times daily. 90 capsule 0  . Continuous Blood Gluc Receiver (DEXCOM G6 RECEIVER) DEVI 1 each by Does not apply route 4 (four) times  daily. 1 each 3  . Continuous Blood Gluc Sensor (DEXCOM G6 SENSOR) MISC 1 each by Does not apply route once a week. Dx E11.65 4 each 11  . Continuous Blood Gluc Transmit (DEXCOM G6 TRANSMITTER) MISC 1 each by Does not apply route 4 (four) times daily. Dx: E11.65 1 each 3  . diphenhydrAMINE (BENADRYL) 25 mg capsule Take 25 mg by mouth See admin instructions. To be given prior to Vancomycin administration due to rash (allergic reaction)    . fluticasone (FLONASE) 50 MCG/ACT nasal spray Place 1 spray into both nostrils 2 (two) times daily as needed for allergies or rhinitis. 16 g 6  . gabapentin (NEURONTIN) 600 MG tablet Take 1 tablet (600 mg total) by mouth at bedtime. 90 tablet 3  . HYDROcodone-acetaminophen (NORCO) 10-325 MG tablet Take 1 tablet by mouth 2 (two) times daily as needed. 60 tablet 0  . Insulin Glargine, 2 Unit Dial, (TOUJEO MAX SOLOSTAR) 300 UNIT/ML SOPN Inject 40-50 Units into the skin daily. 5 pen 5  . metFORMIN (GLUCOPHAGE) 1000 MG tablet Take 1 tablet (1,000 mg total) by mouth 2 (two) times daily. 180 tablet 3  . Multiple Vitamins-Minerals (MULTIVITAMIN WITH MINERALS) tablet Take 1 tablet by mouth daily.    Marland Kitchen zinc gluconate 50 MG tablet Take 50 mg by mouth daily.     No current facility-administered medications on file prior to visit.    No Known Allergies  Social History   Substance and Sexual Activity  Alcohol Use No    Social History   Tobacco Use  Smoking Status Never Smoker  Smokeless Tobacco Never Used    Review of Systems  Constitutional: Negative.   HENT: Negative.   Eyes: Negative.   Respiratory: Negative.   Cardiovascular: Negative.   Gastrointestinal: Negative.   Genitourinary: Negative.   Skin: Negative.   Neurological: Negative.   Endo/Heme/Allergies: Bruises/bleeds easily.  Psychiatric/Behavioral: Negative.     Objective   Vitals:   02/17/20 1524  BP: 109/74  Pulse: (!) 103  Resp: 12  Temp: 98 F (36.7 C)  SpO2: 96%    Physical  Exam Vitals reviewed.  Constitutional:      Appearance: Normal appearance. He is not ill-appearing.  HENT:     Head: Normocephalic and atraumatic.  Cardiovascular:     Rate and Rhythm: Normal rate and regular rhythm.     Heart sounds: Normal heart sounds. No murmur. No friction rub. No gallop.   Pulmonary:     Effort: Pulmonary effort is normal. No respiratory distress.     Breath sounds: Normal breath sounds. No stridor. No wheezing, rhonchi or rales.  Musculoskeletal:     Comments: Left BKA present.  Right foot swollen with open draining wounds along the heel and  the medial and lateral malleolus of the ankle.  There is soft tissue fluctuance extending up the right lateral aspect of the lower extremity.  Some purple skin changes noted in this region.  I was able to express necrotic bloody fluid through the lateral malleolus wound.  Right femoral pulse palpable.  Skin:    General: Skin is warm and dry.  Neurological:     Mental Status: He is alert and oriented to person, place, and time.     Gait: Gait abnormal.    Previous hospitalization discharge summary reviewed  Assessment  Osteomyelitis of right foot with worsening gangrenous changes, chronic anticoagulation due to intermittent atrial fibrillation, Charcot's disease of the right foot Plan   Patient has failed medical therapy and does need a right below the knee amputation.  I told the patient and wife that there is a high risk of wound breakdown of the right BKA and should this occur, this may need to be converted to an above-the-knee amputation.  The risks of the surgery including bleeding, infection, the possibility of a blood transfusion, poor wound healing, and conversion to an above-the-knee amputation were fully explained to the patient, who gave informed consent.  Patient will be scheduled for surgery on 02/19/2020.

## 2020-02-19 ENCOUNTER — Inpatient Hospital Stay (HOSPITAL_COMMUNITY): Payer: Medicare Other | Admitting: Anesthesiology

## 2020-02-19 ENCOUNTER — Encounter (HOSPITAL_COMMUNITY): Payer: Self-pay | Admitting: General Surgery

## 2020-02-19 ENCOUNTER — Inpatient Hospital Stay (HOSPITAL_COMMUNITY)
Admission: RE | Admit: 2020-02-19 | Discharge: 2020-02-23 | DRG: 475 | Disposition: A | Discharge status: Home Health | Payer: Medicare Other | Attending: General Surgery | Admitting: General Surgery

## 2020-02-19 ENCOUNTER — Encounter (HOSPITAL_COMMUNITY)
Admission: RE | Disposition: A | Discharge status: Home / Self Care | Payer: Self-pay | Source: Ambulatory Visit | Attending: General Surgery

## 2020-02-19 DIAGNOSIS — Z89421 Acquired absence of other right toe(s): Secondary | ICD-10-CM | POA: Diagnosis not present

## 2020-02-19 DIAGNOSIS — Z79899 Other long term (current) drug therapy: Secondary | ICD-10-CM

## 2020-02-19 DIAGNOSIS — E1165 Type 2 diabetes mellitus with hyperglycemia: Secondary | ICD-10-CM | POA: Diagnosis not present

## 2020-02-19 DIAGNOSIS — A5277 Syphilis of bone and joint: Principal | ICD-10-CM | POA: Diagnosis present

## 2020-02-19 DIAGNOSIS — Z89511 Acquired absence of right leg below knee: Secondary | ICD-10-CM

## 2020-02-19 DIAGNOSIS — I10 Essential (primary) hypertension: Secondary | ICD-10-CM | POA: Diagnosis present

## 2020-02-19 DIAGNOSIS — E1152 Type 2 diabetes mellitus with diabetic peripheral angiopathy with gangrene: Secondary | ICD-10-CM | POA: Diagnosis not present

## 2020-02-19 DIAGNOSIS — I480 Paroxysmal atrial fibrillation: Secondary | ICD-10-CM | POA: Diagnosis not present

## 2020-02-19 DIAGNOSIS — Z89512 Acquired absence of left leg below knee: Secondary | ICD-10-CM

## 2020-02-19 DIAGNOSIS — E1169 Type 2 diabetes mellitus with other specified complication: Secondary | ICD-10-CM | POA: Diagnosis present

## 2020-02-19 DIAGNOSIS — Z794 Long term (current) use of insulin: Secondary | ICD-10-CM

## 2020-02-19 DIAGNOSIS — E871 Hypo-osmolality and hyponatremia: Secondary | ICD-10-CM | POA: Diagnosis not present

## 2020-02-19 DIAGNOSIS — A5216 Charcot's arthropathy (tabetic): Secondary | ICD-10-CM | POA: Diagnosis not present

## 2020-02-19 DIAGNOSIS — Z7901 Long term (current) use of anticoagulants: Secondary | ICD-10-CM

## 2020-02-19 DIAGNOSIS — M1990 Unspecified osteoarthritis, unspecified site: Secondary | ICD-10-CM | POA: Diagnosis present

## 2020-02-19 DIAGNOSIS — D62 Acute posthemorrhagic anemia: Secondary | ICD-10-CM | POA: Diagnosis not present

## 2020-02-19 DIAGNOSIS — Z20822 Contact with and (suspected) exposure to covid-19: Secondary | ICD-10-CM | POA: Diagnosis present

## 2020-02-19 DIAGNOSIS — E1140 Type 2 diabetes mellitus with diabetic neuropathy, unspecified: Secondary | ICD-10-CM | POA: Diagnosis present

## 2020-02-19 DIAGNOSIS — M869 Osteomyelitis, unspecified: Secondary | ICD-10-CM

## 2020-02-19 HISTORY — PX: AMPUTATION: SHX166

## 2020-02-19 LAB — BASIC METABOLIC PANEL
Anion gap: 13 (ref 5–15)
BUN: 16 mg/dL (ref 6–20)
CO2: 27 mmol/L (ref 22–32)
Calcium: 8.9 mg/dL (ref 8.9–10.3)
Chloride: 90 mmol/L — ABNORMAL LOW (ref 98–111)
Creatinine, Ser: 0.78 mg/dL (ref 0.61–1.24)
GFR calc Af Amer: >60 mL/min (ref >60)
GFR calc non Af Amer: >60 mL/min (ref >60)
Glucose, Bld: 311 mg/dL — ABNORMAL HIGH (ref 70–99)
Potassium: 3.8 mmol/L (ref 3.5–5.1)
Sodium: 130 mmol/L — ABNORMAL LOW (ref 135–145)

## 2020-02-19 LAB — GLUCOSE, CAPILLARY
Glucose-Capillary: 272 mg/dL — ABNORMAL HIGH (ref 70–99)
Glucose-Capillary: 204 mg/dL — ABNORMAL HIGH (ref 70–99)
Glucose-Capillary: 232 mg/dL — ABNORMAL HIGH (ref 70–99)
Glucose-Capillary: 291 mg/dL — ABNORMAL HIGH (ref 70–99)

## 2020-02-19 LAB — HEMOGLOBIN A1C
Hgb A1c MFr Bld: 11.9 % — ABNORMAL HIGH (ref 4.8–5.6)
Mean Plasma Glucose: 295 mg/dL

## 2020-02-19 SURGERY — AMPUTATION BELOW KNEE
Anesthesia: General | Site: Knee | Laterality: Right

## 2020-02-19 MED ORDER — HYDROMORPHONE HCL 1 MG/ML IJ SOLN: 1.0000 mg | INTRAMUSCULAR | Status: DC | PRN

## 2020-02-19 MED ORDER — ACETAMINOPHEN 325 MG PO TABS: 650.0000 mg | ORAL_TABLET | Freq: Four times a day (QID) | ORAL | Status: DC | PRN

## 2020-02-19 MED ORDER — ONDANSETRON HCL 4 MG/2ML IJ SOLN
INTRAMUSCULAR | Status: DC | PRN
  Administered 2020-02-19: 4 mg via INTRAVENOUS

## 2020-02-19 MED ORDER — SUCCINYLCHOLINE CHLORIDE 200 MG/10ML IV SOSY
PREFILLED_SYRINGE | INTRAVENOUS | Status: AC
  Filled 2020-02-19: qty 10

## 2020-02-19 MED ORDER — POVIDONE-IODINE 10 % EX OINT
TOPICAL_OINTMENT | CUTANEOUS | Status: AC
  Filled 2020-02-19: qty 2

## 2020-02-19 MED ORDER — DEXAMETHASONE SODIUM PHOSPHATE 10 MG/ML IJ SOLN
INTRAMUSCULAR | Status: AC
  Filled 2020-02-19: qty 1

## 2020-02-19 MED ORDER — PROMETHAZINE HCL 25 MG/ML IJ SOLN
6.2500 mg | INTRAMUSCULAR | Status: AC | PRN
  Administered 2020-02-19 (×2): 12.5 mg via INTRAVENOUS
  Filled 2020-02-19: qty 1

## 2020-02-19 MED ORDER — GABAPENTIN 300 MG PO CAPS
600.0000 mg | ORAL_CAPSULE | Freq: Every day | ORAL | Status: DC
  Administered 2020-02-19 – 2020-02-22 (×4): 600 mg via ORAL
  Filled 2020-02-19 (×4): qty 2

## 2020-02-19 MED ORDER — OXYCODONE-ACETAMINOPHEN 5-325 MG PO TABS
1.0000 | ORAL_TABLET | ORAL | Status: DC | PRN
  Administered 2020-02-19: 1 via ORAL
  Administered 2020-02-20: 2 via ORAL
  Administered 2020-02-20 – 2020-02-21 (×3): 1 via ORAL
  Administered 2020-02-22: 2 via ORAL
  Filled 2020-02-19 (×2): qty 1
  Filled 2020-02-19: qty 2
  Filled 2020-02-19: qty 1
  Filled 2020-02-19 (×2): qty 2

## 2020-02-19 MED ORDER — ENOXAPARIN SODIUM 40 MG/0.4ML ~~LOC~~ SOLN
40.0000 mg | Freq: Once | SUBCUTANEOUS | Status: AC
  Administered 2020-02-19: 40 mg via SUBCUTANEOUS
  Filled 2020-02-19: qty 0.4

## 2020-02-19 MED ORDER — DIPHENHYDRAMINE HCL 25 MG PO CAPS: 25.0000 mg | ORAL_CAPSULE | Freq: Four times a day (QID) | ORAL | Status: DC | PRN

## 2020-02-19 MED ORDER — LORAZEPAM 2 MG/ML IJ SOLN: 1.0000 mg | INTRAMUSCULAR | Status: DC | PRN

## 2020-02-19 MED ORDER — ENOXAPARIN SODIUM 40 MG/0.4ML ~~LOC~~ SOLN
40.0000 mg | SUBCUTANEOUS | Status: DC
  Administered 2020-02-20 – 2020-02-22 (×3): 40 mg via SUBCUTANEOUS
  Filled 2020-02-19 (×3): qty 0.4

## 2020-02-19 MED ORDER — SODIUM CHLORIDE 0.9% IV SOLUTION: Freq: Once | INTRAVENOUS | Status: DC

## 2020-02-19 MED ORDER — ALBUTEROL SULFATE (2.5 MG/3ML) 0.083% IN NEBU: 2.5000 mg | INHALATION_SOLUTION | Freq: Four times a day (QID) | RESPIRATORY_TRACT | Status: DC | PRN

## 2020-02-19 MED ORDER — LIDOCAINE 2% (20 MG/ML) 5 ML SYRINGE
INTRAMUSCULAR | Status: AC
  Filled 2020-02-19: qty 10

## 2020-02-19 MED ORDER — METFORMIN HCL 500 MG PO TABS
1000.0000 mg | ORAL_TABLET | Freq: Two times a day (BID) | ORAL | Status: DC
  Administered 2020-02-19 – 2020-02-23 (×8): 1000 mg via ORAL
  Filled 2020-02-19 (×8): qty 2

## 2020-02-19 MED ORDER — SODIUM CHLORIDE 0.9 % IR SOLN
Status: DC | PRN
  Administered 2020-02-19: 1000 mL

## 2020-02-19 MED ORDER — ROCURONIUM BROMIDE 10 MG/ML (PF) SYRINGE
PREFILLED_SYRINGE | INTRAVENOUS | Status: AC
  Filled 2020-02-19: qty 30

## 2020-02-19 MED ORDER — KETOROLAC TROMETHAMINE 30 MG/ML IJ SOLN: 30.0000 mg | Freq: Four times a day (QID) | INTRAMUSCULAR | Status: DC | PRN

## 2020-02-19 MED ORDER — ONDANSETRON 4 MG PO TBDP: 4.0000 mg | ORAL_TABLET | Freq: Four times a day (QID) | ORAL | Status: DC | PRN

## 2020-02-19 MED ORDER — PHENYLEPHRINE HCL (PRESSORS) 10 MG/ML IV SOLN
INTRAVENOUS | Status: AC
  Filled 2020-02-19: qty 1

## 2020-02-19 MED ORDER — MEPERIDINE HCL 50 MG/ML IJ SOLN: 6.2500 mg | INTRAMUSCULAR | Status: DC | PRN

## 2020-02-19 MED ORDER — PROPOFOL 10 MG/ML IV BOLUS
INTRAVENOUS | Status: AC
  Filled 2020-02-19: qty 20

## 2020-02-19 MED ORDER — INSULIN ASPART 100 UNIT/ML ~~LOC~~ SOLN
10.0000 [IU] | Freq: Once | SUBCUTANEOUS | Status: AC
  Administered 2020-02-19: 10 [IU] via SUBCUTANEOUS
  Filled 2020-02-19: qty 0.1

## 2020-02-19 MED ORDER — ZINC SULFATE 220 (50 ZN) MG PO CAPS
220.0000 mg | ORAL_CAPSULE | Freq: Every day | ORAL | Status: DC
  Administered 2020-02-20 – 2020-02-23 (×4): 220 mg via ORAL
  Filled 2020-02-19 (×4): qty 1

## 2020-02-19 MED ORDER — ZINC GLUCONATE 50 MG PO TABS: 50.0000 mg | ORAL_TABLET | Freq: Every day | ORAL | Status: DC

## 2020-02-19 MED ORDER — HYDROMORPHONE HCL 1 MG/ML IJ SOLN
0.2500 mg | INTRAMUSCULAR | Status: DC | PRN
  Administered 2020-02-19: 0.5 mg via INTRAVENOUS
  Filled 2020-02-19: qty 0.5

## 2020-02-19 MED ORDER — CEFAZOLIN SODIUM-DEXTROSE 2-4 GM/100ML-% IV SOLN
2.0000 g | Freq: Three times a day (TID) | INTRAVENOUS | Status: DC
  Administered 2020-02-19 – 2020-02-23 (×12): 2 g via INTRAVENOUS
  Filled 2020-02-19 (×12): qty 100

## 2020-02-19 MED ORDER — DIPHENHYDRAMINE HCL 50 MG/ML IJ SOLN
25.0000 mg | INTRAMUSCULAR | Status: AC | PRN
  Administered 2020-02-19 (×2): 25 mg via INTRAVENOUS

## 2020-02-19 MED ORDER — SODIUM CHLORIDE 0.9 % IV SOLN: INTRAVENOUS | Status: DC

## 2020-02-19 MED ORDER — ACETAMINOPHEN 650 MG RE SUPP: 650.0000 mg | Freq: Four times a day (QID) | RECTAL | Status: DC | PRN

## 2020-02-19 MED ORDER — FENTANYL CITRATE (PF) 100 MCG/2ML IJ SOLN
INTRAMUSCULAR | Status: AC
  Filled 2020-02-19: qty 2

## 2020-02-19 MED ORDER — ONDANSETRON HCL 4 MG/2ML IJ SOLN
INTRAMUSCULAR | Status: AC
  Filled 2020-02-19: qty 4

## 2020-02-19 MED ORDER — EPHEDRINE 5 MG/ML INJ
INTRAVENOUS | Status: AC
  Filled 2020-02-19: qty 10

## 2020-02-19 MED ORDER — LACTATED RINGERS IV SOLN: INTRAVENOUS | Status: DC | PRN

## 2020-02-19 MED ORDER — FENTANYL CITRATE (PF) 100 MCG/2ML IJ SOLN
50.0000 ug | INTRAMUSCULAR | Status: DC | PRN
  Administered 2020-02-19: 50 ug via INTRAVENOUS

## 2020-02-19 MED ORDER — PHENYLEPHRINE 40 MCG/ML (10ML) SYRINGE FOR IV PUSH (FOR BLOOD PRESSURE SUPPORT)
PREFILLED_SYRINGE | INTRAVENOUS | Status: AC
  Filled 2020-02-19: qty 10

## 2020-02-19 MED ORDER — CEFAZOLIN SODIUM-DEXTROSE 2-4 GM/100ML-% IV SOLN
INTRAVENOUS | Status: AC
  Filled 2020-02-19: qty 100

## 2020-02-19 MED ORDER — ALBUTEROL SULFATE HFA 108 (90 BASE) MCG/ACT IN AERS: 2.0000 | INHALATION_SPRAY | Freq: Four times a day (QID) | RESPIRATORY_TRACT | Status: DC | PRN

## 2020-02-19 MED ORDER — INSULIN ASPART 100 UNIT/ML ~~LOC~~ SOLN
0.0000 [IU] | Freq: Three times a day (TID) | SUBCUTANEOUS | Status: DC
  Administered 2020-02-19: 5 [IU] via SUBCUTANEOUS
  Administered 2020-02-20: 11 [IU] via SUBCUTANEOUS

## 2020-02-19 MED ORDER — LACTATED RINGERS IV SOLN: Freq: Once | INTRAVENOUS | Status: AC

## 2020-02-19 MED ORDER — DIPHENHYDRAMINE HCL 50 MG/ML IJ SOLN
INTRAMUSCULAR | Status: AC
  Filled 2020-02-19: qty 1

## 2020-02-19 MED ORDER — CHLORHEXIDINE GLUCONATE CLOTH 2 % EX PADS: 6.0000 | MEDICATED_PAD | Freq: Once | CUTANEOUS | Status: DC

## 2020-02-19 MED ORDER — ONDANSETRON HCL 4 MG/2ML IJ SOLN: 4.0000 mg | Freq: Four times a day (QID) | INTRAMUSCULAR | Status: DC | PRN

## 2020-02-19 MED ORDER — SODIUM CHLORIDE FLUSH 0.9 % IV SOLN
INTRAVENOUS | Status: AC
  Filled 2020-02-19: qty 10

## 2020-02-19 MED ORDER — PROPOFOL 10 MG/ML IV BOLUS
INTRAVENOUS | Status: DC | PRN
  Administered 2020-02-19: 180 mg via INTRAVENOUS
  Administered 2020-02-19: 20 mg via INTRAVENOUS

## 2020-02-19 MED ORDER — POVIDONE-IODINE 10 % OINT PACKET
TOPICAL_OINTMENT | CUTANEOUS | Status: DC | PRN
  Administered 2020-02-19: 2 via TOPICAL

## 2020-02-19 MED ORDER — PHENYLEPHRINE HCL (PRESSORS) 10 MG/ML IV SOLN
INTRAVENOUS | Status: DC | PRN
  Administered 2020-02-19 (×6): 40 ug via INTRAVENOUS

## 2020-02-19 MED ORDER — HYDROMORPHONE HCL 1 MG/ML IJ SOLN
INTRAMUSCULAR | Status: DC | PRN
  Administered 2020-02-19 (×2): .5 mg via INTRAVENOUS

## 2020-02-19 MED ORDER — MIDAZOLAM HCL 2 MG/2ML IJ SOLN: 2.0000 mg | Freq: Once | INTRAMUSCULAR | Status: DC

## 2020-02-19 MED ORDER — SUCCINYLCHOLINE CHLORIDE 20 MG/ML IJ SOLN
INTRAMUSCULAR | Status: DC | PRN
  Administered 2020-02-19: 100 mg via INTRAVENOUS

## 2020-02-19 MED ORDER — FENTANYL CITRATE (PF) 100 MCG/2ML IJ SOLN
INTRAMUSCULAR | Status: DC | PRN
  Administered 2020-02-19 (×3): 50 ug via INTRAVENOUS

## 2020-02-19 MED ORDER — FLUTICASONE PROPIONATE 50 MCG/ACT NA SUSP: 1.0000 | Freq: Two times a day (BID) | NASAL | Status: DC | PRN

## 2020-02-19 MED ORDER — METHOCARBAMOL 500 MG PO TABS
500.0000 mg | ORAL_TABLET | Freq: Four times a day (QID) | ORAL | Status: DC | PRN
  Administered 2020-02-22: 500 mg via ORAL
  Filled 2020-02-19: qty 1

## 2020-02-19 MED ORDER — AMIODARONE HCL 200 MG PO TABS
200.0000 mg | ORAL_TABLET | Freq: Every day | ORAL | Status: DC
  Administered 2020-02-20 – 2020-02-23 (×4): 200 mg via ORAL
  Filled 2020-02-19 (×4): qty 1

## 2020-02-19 MED ORDER — DIPHENHYDRAMINE HCL 50 MG/ML IJ SOLN: 25.0000 mg | Freq: Four times a day (QID) | INTRAMUSCULAR | Status: DC | PRN

## 2020-02-19 MED ORDER — HYDROMORPHONE HCL 1 MG/ML IJ SOLN
INTRAMUSCULAR | Status: AC
  Filled 2020-02-19: qty 1

## 2020-02-19 MED ORDER — CEFAZOLIN SODIUM-DEXTROSE 2-3 GM-%(50ML) IV SOLR
INTRAVENOUS | Status: DC | PRN
  Administered 2020-02-19: 2 g via INTRAVENOUS

## 2020-02-19 MED ORDER — ATORVASTATIN CALCIUM 20 MG PO TABS
20.0000 mg | ORAL_TABLET | Freq: Every day | ORAL | Status: DC
  Administered 2020-02-20 – 2020-02-23 (×4): 20 mg via ORAL
  Filled 2020-02-19 (×4): qty 1

## 2020-02-19 MED ORDER — ADULT MULTIVITAMIN W/MINERALS CH
1.0000 | ORAL_TABLET | Freq: Every day | ORAL | Status: DC
  Administered 2020-02-20 – 2020-02-23 (×4): 1 via ORAL
  Filled 2020-02-19 (×4): qty 1

## 2020-02-19 MED ORDER — KETOROLAC TROMETHAMINE 30 MG/ML IJ SOLN
30.0000 mg | Freq: Four times a day (QID) | INTRAMUSCULAR | Status: AC
  Administered 2020-02-19: 30 mg via INTRAVENOUS
  Filled 2020-02-19: qty 1

## 2020-02-19 SURGICAL SUPPLY — 38 items
BANDAGE ELASTIC 4 VELCRO NS (GAUZE/BANDAGES/DRESSINGS) ×1 IMPLANT
BLADE SAW RECIP 87.9 MT (BLADE) ×2 IMPLANT
BLADE SURG SZ10 CARB STEEL (BLADE) ×3 IMPLANT
BNDG COHESIVE 4X5 TAN STRL (GAUZE/BANDAGES/DRESSINGS) ×3 IMPLANT
BNDG GAUZE ELAST 4 BULKY (GAUZE/BANDAGES/DRESSINGS) ×3 IMPLANT
CLOTH BEACON ORANGE TIMEOUT ST (SAFETY) ×2 IMPLANT
COVER LIGHT HANDLE STERIS (MISCELLANEOUS) ×4 IMPLANT
COVER WAND RF STERILE (DRAPES) ×2 IMPLANT
DRESSING MEPILEX BORDER 6X8 (GAUZE/BANDAGES/DRESSINGS) IMPLANT
DRSG MEPILEX BORDER 4X12 (GAUZE/BANDAGES/DRESSINGS) ×1 IMPLANT
DRSG MEPILEX BORDER 6X8 (GAUZE/BANDAGES/DRESSINGS) ×2
ELECT REM PT RETURN 9FT ADLT (ELECTROSURGICAL) ×2
ELECTRODE REM PT RTRN 9FT ADLT (ELECTROSURGICAL) ×1 IMPLANT
GAUZE SPONGE 4X4 12PLY STRL (GAUZE/BANDAGES/DRESSINGS) ×3 IMPLANT
GLOVE BIO SURGEON STRL SZ7 (GLOVE) ×1 IMPLANT
GLOVE BIOGEL PI IND STRL 7.0 (GLOVE) ×2 IMPLANT
GLOVE BIOGEL PI INDICATOR 7.0 (GLOVE) ×3
GLOVE SURG SS PI 7.5 STRL IVOR (GLOVE) ×2 IMPLANT
GOWN STRL REUS W/TWL LRG LVL3 (GOWN DISPOSABLE) ×6 IMPLANT
INST SET MAJOR BONE (KITS) ×2 IMPLANT
KIT TURNOVER KIT A (KITS) ×2 IMPLANT
MANIFOLD NEPTUNE II (INSTRUMENTS) ×2 IMPLANT
NS IRRIG 1000ML POUR BTL (IV SOLUTION) ×2 IMPLANT
PACK BASIC LIMB (CUSTOM PROCEDURE TRAY) ×2 IMPLANT
PAD ABD 5X9 TENDERSORB (GAUZE/BANDAGES/DRESSINGS) ×4 IMPLANT
PAD ARMBOARD 7.5X6 YLW CONV (MISCELLANEOUS) ×2 IMPLANT
PENCIL SMOKE EVACUATOR (MISCELLANEOUS) ×1 IMPLANT
SET BASIN LINEN APH (SET/KITS/TRAYS/PACK) ×2 IMPLANT
SOL PREP PROV IODINE SCRUB 4OZ (MISCELLANEOUS) ×2 IMPLANT
SPONGE LAP 18X18 RF (DISPOSABLE) ×4 IMPLANT
STAPLER VISISTAT (STAPLE) ×1 IMPLANT
SUT BONE WAX W31G (SUTURE) ×1 IMPLANT
SUT SILK 0 FSL (SUTURE) ×2 IMPLANT
SUT SILK 2 0 REEL (SUTURE) ×2 IMPLANT
SUT SILK 2 0 SH (SUTURE) ×3 IMPLANT
SUT VIC AB 2-0 CT1 27 (SUTURE) ×20
SUT VIC AB 2-0 CT1 TAPERPNT 27 (SUTURE) IMPLANT
SUT VICRYL AB 2 0 TIES (SUTURE) ×1 IMPLANT

## 2020-02-19 NOTE — Anesthesia Procedure Notes (Signed)
Procedure Name: Intubation Date/Time: 02/19/2020 12:37 PM Performed by: Vista Deck, CRNA Pre-anesthesia Checklist: Patient identified, Patient being monitored, Timeout performed, Emergency Drugs available and Suction available Patient Re-evaluated:Patient Re-evaluated prior to induction Oxygen Delivery Method: Circle System Utilized Preoxygenation: Pre-oxygenation with 100% oxygen Induction Type: IV induction Laryngoscope Size: Mac and 3 Grade View: Grade II Tube type: Oral Tube size: 7.0 mm Number of attempts: 1 Airway Equipment and Method: stylet Placement Confirmation: ETT inserted through vocal cords under direct vision,  positive ETCO2 and breath sounds checked- equal and bilateral Secured at: 22 cm Tube secured with: Tape Dental Injury: Teeth and Oropharynx as per pre-operative assessment

## 2020-02-19 NOTE — Op Note (Signed)
Patient:  [name]  DOB:  04/25/1966  MRN:  [id number]   Preop Diagnosis: Osteomyelitis of right foot and ankle  Postop Diagnosis: Same  Procedure: Right below the knee amputation  Surgeon: [name], MD  Anes: General endotracheal  Indications: Patient is a 54 year old white male status post a left below the knee amputation in the past who now presents with osteomyelitis of the right foot and ankle.  Despite medical therapy, the cellulitis has worsened.  He also suffers from Charcot's disease.  He now presents for a right below the knee amputation.  The risks and benefits of the procedure including bleeding, infection, the possible need for blood transfusion, wound infection, and the possibility of needing further surgery were fully explained to the patient, who gave informed consent.  Procedure note: The patient was placed in the supine position.  After induction of general endotracheal anesthesia, the right lower extremity was prepped and draped using the usual sterile technique with Betadine.  The right foot was isolated due to multiple draining wounds around the ankle.  An incision was made a handsbreadth below the right tibial tuberosity.  The soft tissue and incision was done halfway down the right lower extremity.  Using an amputation knife, the posterior flap was formed just below the tibia and fibula.  The remaining soft tissue was then divided.  The tibia and fibula were divided using the power saw.  The specimen was then removed from the operative field.  Any large vessels were controlled using 2-0 Vicryl ties.  The remaining muscle and soft tissue was divided using Bovie electrocautery.  The remaining gastrocnemius muscle was excised up to the distal end of the tibia.  The posterior and anterior fascial edges were then reapproximated once the posterior fascia was trimmed.  The skin was also trimmed on the posterior flap in order to facilitate adequate closure.  The  subcutaneous layer was reapproximated using 2-0 Vicryl interrupted sutures.  The skin was closed using staples.  Betadine ointment, dry sterile dressings, and an Ace wrap were applied.  All tape and needle counts were correct at the end of the procedure.  The patient was extubated in the operating room and transferred to PACU in stable condition.  Complications: None  EBL: 500 cc  Specimen: Right lower extremity

## 2020-02-19 NOTE — Anesthesia Postprocedure Evaluation (Signed)
Anesthesia Post Note  Patient: [name]  Procedure(s) Performed: AMPUTATION BELOW KNEE; RIGHT (Right Knee)  Patient location during evaluation: PACU Anesthesia Type: General Level of consciousness: awake and alert, patient cooperative and oriented Pain management: pain level controlled Vital Signs Assessment: post-procedure vital signs reviewed and stable Respiratory status: spontaneous breathing, respiratory function stable and nonlabored ventilation Postop Assessment: no apparent nausea or vomiting Anesthetic complications: no     Last Vitals:  Vitals:   02/19/20 1415 02/19/20 1430  BP: 117/73 124/87  Pulse: 96 93  Resp: 17 16  Temp:    SpO2: 92% 98%    Last Pain:  Vitals:   02/19/20 1044  TempSrc: Oral  PainSc: 7                  Arnet Hofferber

## 2020-02-19 NOTE — Plan of Care (Signed)

## 2020-02-19 NOTE — Interval H&P Note (Signed)
History and Physical Interval Note:  02/19/2020 12:21 PM  [name]  has presented today for surgery, with the diagnosis of Osteomylitis of right foot.  The various methods of treatment have been discussed with the patient and family. After consideration of risks, benefits and other options for treatment, the patient has consented to  Procedure(s): AMPUTATION BELOW KNEE; RIGHT (Right) as a surgical intervention.  The patient's history has been reviewed, patient examined, no change in status, stable for surgery.  I have reviewed the patient's chart and labs.  Questions were answered to the patient's satisfaction.     [name]

## 2020-02-19 NOTE — Transfer of Care (Signed)
Immediate Anesthesia Transfer of Care Note  Patient: [name]  Procedure(s) Performed: AMPUTATION BELOW KNEE; RIGHT (Right Knee)  Patient Location: PACU  Anesthesia Type:General  Level of Consciousness: awake, alert  and patient cooperative  Airway & Oxygen Therapy: Patient Spontanous Breathing  Post-op Assessment: Report given to RN and Post -op Vital signs reviewed and stable  Post vital signs: Reviewed and stable  Last Vitals:  Vitals Value Taken Time  BP    Temp 99.3   Pulse 95 02/19/20 1405  Resp    SpO2 95 % 02/19/20 1405  Vitals shown include unvalidated device data.  Last Pain:  Vitals:   02/19/20 1044  TempSrc: Oral  PainSc: 7       Patients Stated Pain Goal: 6 (02/19/20 1044)  Complications: No apparent anesthesia complications99.54

## 2020-02-20 LAB — HEMOGLOBIN AND HEMATOCRIT, BLOOD
HCT: 26.8 % — ABNORMAL LOW (ref 39.0–52.0)
Hemoglobin: 8.4 g/dL — ABNORMAL LOW (ref 13.0–17.0)

## 2020-02-20 LAB — PHOSPHORUS: Phosphorus: 3.1 mg/dL (ref 2.5–4.6)

## 2020-02-20 LAB — HEMOGLOBIN A1C
Hgb A1c MFr Bld: 11.5 % — ABNORMAL HIGH (ref 4.8–5.6)
Mean Plasma Glucose: 283.35 mg/dL

## 2020-02-20 LAB — BASIC METABOLIC PANEL
Anion gap: 11 (ref 5–15)
BUN: 16 mg/dL (ref 6–20)
CO2: 24 mmol/L (ref 22–32)
Calcium: 7.4 mg/dL — ABNORMAL LOW (ref 8.9–10.3)
Chloride: 96 mmol/L — ABNORMAL LOW (ref 98–111)
Creatinine, Ser: 0.72 mg/dL (ref 0.61–1.24)
GFR calc Af Amer: >60 mL/min (ref >60)
GFR calc non Af Amer: >60 mL/min (ref >60)
Glucose, Bld: 327 mg/dL — ABNORMAL HIGH (ref 70–99)
Potassium: 3.6 mmol/L (ref 3.5–5.1)
Sodium: 131 mmol/L — ABNORMAL LOW (ref 135–145)

## 2020-02-20 LAB — MAGNESIUM: Magnesium: 1.5 mg/dL — ABNORMAL LOW (ref 1.7–2.4)

## 2020-02-20 LAB — CBC
HCT: 23.2 % — ABNORMAL LOW (ref 39.0–52.0)
Hemoglobin: 7.1 g/dL — ABNORMAL LOW (ref 13.0–17.0)
MCH: 25.6 pg — ABNORMAL LOW (ref 26.0–34.0)
MCHC: 30.6 g/dL (ref 30.0–36.0)
MCV: 83.8 fL (ref 80.0–100.0)
Platelets: 317 10*3/uL (ref 150–400)
RBC: 2.77 MIL/uL — ABNORMAL LOW (ref 4.22–5.81)
RDW: 13.9 % (ref 11.5–15.5)
WBC: 5.5 10*3/uL (ref 4.0–10.5)
nRBC: 0 % (ref 0.0–0.2)

## 2020-02-20 LAB — GLUCOSE, CAPILLARY
Glucose-Capillary: 322 mg/dL — ABNORMAL HIGH (ref 70–99)
Glucose-Capillary: 244 mg/dL — ABNORMAL HIGH (ref 70–99)
Glucose-Capillary: 162 mg/dL — ABNORMAL HIGH (ref 70–99)
Glucose-Capillary: 150 mg/dL — ABNORMAL HIGH (ref 70–99)

## 2020-02-20 LAB — POCT HEMOGLOBIN-HEMACUE: Hemoglobin: 8.7 g/dL — ABNORMAL LOW (ref 13.0–17.0)

## 2020-02-20 MED ORDER — INSULIN ASPART 100 UNIT/ML ~~LOC~~ SOLN
0.0000 [IU] | Freq: Three times a day (TID) | SUBCUTANEOUS | Status: DC
  Administered 2020-02-20: 4 [IU] via SUBCUTANEOUS
  Administered 2020-02-20: 7 [IU] via SUBCUTANEOUS
  Administered 2020-02-21: 4 [IU] via SUBCUTANEOUS
  Administered 2020-02-21: 3 [IU] via SUBCUTANEOUS
  Administered 2020-02-21: 4 [IU] via SUBCUTANEOUS
  Administered 2020-02-22: 7 [IU] via SUBCUTANEOUS
  Administered 2020-02-22: 4 [IU] via SUBCUTANEOUS
  Administered 2020-02-22 – 2020-02-23 (×2): 3 [IU] via SUBCUTANEOUS
  Administered 2020-02-23: 11 [IU] via SUBCUTANEOUS

## 2020-02-20 MED ORDER — LIVING WELL WITH DIABETES BOOK: Freq: Once | Status: AC

## 2020-02-20 MED ORDER — MAGNESIUM SULFATE 2 GM/50ML IV SOLN
2.0000 g | Freq: Once | INTRAVENOUS | Status: AC
  Administered 2020-02-20: 2 g via INTRAVENOUS
  Filled 2020-02-20: qty 50

## 2020-02-20 MED ORDER — INSULIN GLARGINE 100 UNIT/ML ~~LOC~~ SOLN
25.0000 [IU] | Freq: Every morning | SUBCUTANEOUS | Status: DC
  Administered 2020-02-20 – 2020-02-23 (×4): 25 [IU] via SUBCUTANEOUS
  Filled 2020-02-20 (×6): qty 0.25

## 2020-02-20 MED ORDER — SODIUM CHLORIDE 0.9% IV SOLUTION: Freq: Once | INTRAVENOUS | Status: AC

## 2020-02-20 MED ORDER — INSULIN ASPART 100 UNIT/ML ~~LOC~~ SOLN
0.0000 [IU] | Freq: Every day | SUBCUTANEOUS | Status: DC
  Administered 2020-02-22: 2 [IU] via SUBCUTANEOUS

## 2020-02-20 NOTE — Care Management Important Message (Signed)
Important Message  Patient Details  Name: [name] MRN: [id number] Date of Birth: 10/10/1966   Medicare Important Message Given:  Yes     Corey Harold 02/20/2020, 12:36 PM

## 2020-02-20 NOTE — Progress Notes (Signed)
Inpatient Diabetes Program Recommendations  AACE/ADA: New Consensus Statement on Inpatient Glycemic Control (2015)  Target Ranges:  Prepandial:   less than 140 mg/dL      Peak postprandial:   less than 180 mg/dL (1-2 hours)      Critically ill patients:  140 - 180 mg/dL   Lab Results  Component Value Date   GLUCAP 322 (H) 02/20/2020   HGBA1C 11.9 (H) 02/18/2020    Review of Glycemic Control Results for [name], [name] (MRN [id number]) as of 02/20/2020 09:05  Ref. Range 02/19/2020 12:23 02/19/2020 14:09 02/19/2020 16:56 02/19/2020 20:27 02/20/2020 07:36  Glucose-Capillary Latest Ref Range: 70 - 99 mg/dL 706 (H) 237 (H) 628 (H) 291 (H) 322 (H)   Diabetes history: DM2 Outpatient Diabetes medications: Lantus 40-50 units qd + Metformin 1 gm bid Current orders for Inpatient glycemic control: Metformin 1 gm bid + Novolog moderate 0-15 units tid  Inpatient Diabetes Program Recommendations:   -Add Lantus 25 units qd starting this am -Add Novolog 0-5 units hs correction Noted A1c from 9.8 10/23/19 to currently 11.9. Will plan to speak with patient.  Thank you, Billy Fischer. Sourish Allender, RN, MSN, CDE  Diabetes Coordinator Inpatient Glycemic Control Team Team Pager [phone] (8am-5pm) 02/20/2020 9:07 AM

## 2020-02-20 NOTE — Plan of Care (Signed)
  Problem: Nutrition: Goal: Adequate nutrition will be maintained Outcome: Progressing   Problem: Elimination: Goal: Will not experience complications related to urinary retention Outcome: Progressing Note: Patient is able to spontaneously void using a urinal.

## 2020-02-20 NOTE — Plan of Care (Signed)
PT to be able to transfer with  Rt prothesis and walker with mod I..  Pt to be able to ambulate with Rt prothesis and walker with mod I x 68ft .

## 2020-02-20 NOTE — Progress Notes (Signed)
1 Day Post-Op  Subjective: Patient has minimal incisional pain.  It is well controlled with pain medication.  Objective: Vital signs in last 24 hours: Temp:  [98 F (36.7 C)-99.2 F (37.3 C)] 98.9 F (37.2 C) (04/16 0444) Pulse Rate:  [75-97] 80 (04/16 0444) Resp:  [15-24] 16 (04/16 0444) BP: (98-124)/(60-87) 98/60 (04/16 0444) SpO2:  [92 %-100 %] 100 % (04/16 0444) Weight:  [73.3 kg] 73.3 kg (04/15 1539) Last BM Date: 02/19/20  Intake/Output from previous day: 04/15 0701 - 04/16 0700 In: 1970 [P.O.:120; I.V.:1800; IV Piggyback:50] Out: 1300 [Urine:800; Blood:500] Intake/Output this shift: Total I/O In: -  Out: 450 [Urine:450]  General appearance: alert, cooperative and no distress Resp: clear to auscultation bilaterally Cardio: regular rate and rhythm, S1, S2 normal, no murmur, click, rub or gallop Extremities: Right BKA site healing well.  No hematoma or drainage noted.  Incision well approximated.  Lab Results:  Recent Labs    02/18/20 1126 02/18/20 1126 02/19/20 1413 02/20/20 0508  WBC 10.1  --   --  5.5  HGB 10.7*   < > 8.7* 7.1*  HCT 33.3*  --   --  23.2*  PLT 438*  --   --  317   < > = values in this interval not displayed.   BMET Recent Labs    02/19/20 1107 02/20/20 0508  NA 130* 131*  K 3.8 3.6  CL 90* 96*  CO2 27 24  GLUCOSE 311* 327*  BUN 16 16  CREATININE 0.78 0.72  CALCIUM 8.9 7.4*   PT/INR No results for input(s): LABPROT, INR in the last 72 hours.  Studies/Results: No results found.  Anti-infectives: Anti-infectives (From admission, onward)   Start     Dose/Rate Route Frequency Ordered Stop   02/19/20 1600  ceFAZolin (ANCEF) IVPB 2g/100 mL premix     2 g 200 mL/hr over 30 Minutes Intravenous Every 8 hours 02/19/20 1545        Assessment/Plan: s/p Procedure(s): AMPUTATION BELOW KNEE; RIGHT Impression: Stable on postoperative day 1.  Anemia secondary to acute surgical blood loss.  Will receive 1 unit of packed red blood cells  due to low normal blood pressure.  Hypomagnesemia noted and will be treated.  Will start PT.  Adjust IV fluids.  LOS: 1 day    [name] 02/20/2020

## 2020-02-20 NOTE — Evaluation (Signed)
Physical Therapy Evaluation Patient Details Name: [name] MRN: [id number] DOB: 1966/09/13 Today's Date: 02/20/2020   History of Present Illness  PT is a 54 yo male who has a Lt BKA. 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   He is currently being referred to skilled PT for transfer training.  Clinical Impression  [name] is a willing participant in therapy.  Initially he only wanted to sit on the side of the bed but as treatment progressed agreed to transfer.  Pt is able to transfer with min guard assist.  Noted decreased UE strength therefore therapist would recommend OT as well.  PT desires to go home with [name] not to SNF.      Follow Up Recommendations Home health PT    Equipment Recommendations  3in1 (PT);Wheelchair (measurements PT)    Recommendations for Other Services   OT    Precautions / Restrictions Precautions Precautions: Fall Precaution Comments: PT needs prothesis for Rt LE Restrictions Weight Bearing Restrictions: No      Mobility  Bed Mobility Overal bed mobility: Modified Independent                Transfers Overall transfer level: Needs assistance Equipment used: Rolling walker (2 wheeled) Transfers: Stand Pivot Transfers   Stand pivot transfers: Min guard       General transfer comment: Pt able to transfer from bed to wheelchair;  from wheelchair to bed with min guard assist  Ambulation/Gait   Gait Distance (Feet): 0 Feet Assistive device: Rolling walker (2 wheeled)          Stairs            Wheelchair Mobility    Modified Rankin (Stroke Patients Only)       Balance Overall balance assessment: (sitting balance good + ;  standing fair -)                                           Pertinent Vitals/Pain Pain Assessment: 0-10 Pain Score: 4  Pain Descriptors / Indicators: Throbbing Pain Intervention(s): Limited activity within patient's  tolerance;Other (comment)(encouraged pt to call nurse for pain meds now and not wait until pain got to high)    Home Living Family/patient expects to be discharged to:: Private residence Living Arrangements: Children;Spouse/significant other;Other relatives Available Help at Discharge: Friend(s) Type of Home: House Home Access: Level entry     Home Layout: Able to live on main level with bedroom/bathroom Home Equipment: Walker - 2 wheels;Cane - single point      Prior Function Level of Independence: Independent with assistive device(s)                  Extremity/Trunk Assessment        Lower Extremity Assessment Lower Extremity Assessment: RLE deficits/detail RLE Deficits / Details: Rt BKA but able to hold pt weight LLE Deficits / Details: manual testing shows good hip strength.       Communication   Communication: No difficulties  Cognition Arousal/Alertness: Awake/alert Behavior During Therapy: WFL for tasks assessed/performed Overall Cognitive Status: Within Functional Limits for tasks assessed  Exercises General Exercises - Lower Extremity Quad Sets: Both;5 reps Hip ABduction/ADduction: Both;5 reps(Lt hip extension while Rt sidelying to stretch hip flexion) Straight Leg Raises: Both;5 reps   Assessment/Plan    PT Assessment Patient needs continued PT services  PT Problem List Decreased strength;Decreased activity tolerance;Decreased balance;Pain       PT Treatment Interventions Gait training;Therapeutic exercise;Therapeutic activities;Functional mobility training    PT Goals (Current goals can be found in the Care Plan section)  Acute Rehab PT Goals Patient Stated Goal: To go home and be able to get around with his walker PT Goal Formulation: With patient Time For Goal Achievement: 02/23/20 Potential to Achieve Goals: Good    Frequency Min 6X/week           AM-PAC PT "6 Clicks"  Mobility  Outcome Measure Help needed turning from your back to your side while in a flat bed without using bedrails?: None Help needed moving from lying on your back to sitting on the side of a flat bed without using bedrails?: None Help needed moving to and from a bed to a chair (including a wheelchair)?: A Little Help needed standing up from a chair using your arms (e.g., wheelchair or bedside chair)?: A Little Help needed to walk in hospital room?: A Lot Help needed climbing 3-5 steps with a railing? : A Lot 6 Click Score: 18    End of Session Equipment Utilized During Treatment: Gait belt Activity Tolerance: Patient tolerated treatment well;Patient limited by fatigue Patient left: in bed;with call bell/phone within reach;with family/visitor present   PT Visit Diagnosis: Unsteadiness on feet (R26.81);Muscle weakness (generalized) (M62.81);Difficulty in walking, not elsewhere classified (R26.2)    Time: 1330-1400 PT Time Calculation (min) (ACUTE ONLY): 30 min   Charges:   PT Evaluation $PT Eval Low Complexity: 1 Low           Virgina Organ, PT CLT [phone] 02/20/2020, 3:39 PM

## 2020-02-20 NOTE — TOC Initial Note (Signed)
Transition of Care [facility]) - Initial/Assessment Note   Patient Details  Name: [name] MRN: [id number] Date of Birth: 12-04-1965  Transition of Care [facility]) CM/SW Contact:    Sherie Don, LCSW Phone Number: 02/20/2020, 4:57 PM  Clinical Narrative: TOC received consult for HH/DME needs. Patient is a 54 year old male who was admitted to the hospital post-surgery for a right below the knee amputation. CSW called patient to complete initial assessment. Patient lives in a single family home with his father, [name]. Patient is currently active with [facility] for [facility] [place]. Patient is agreeable with resuming services. Patient has a wheelchair, BSC, handicapped bathroom, and ramp at home. Patient currently gets his supplies for his amputations from [facility]: Immunologist in [place]. Linda with [facility] notified patient is in hospital and PT is recommending OT and PT at discharge.  Expected Discharge Plan: [place] Barriers to Discharge: Continued Medical Work up  Patient Goals and CMS Choice Patient states their goals for this hospitalization and ongoing recovery are:: Discharge home CMS Medicare.gov Compare Post Acute Care list provided to:: Patient Choice offered to / list presented to : Patient  Expected Discharge Plan and Services Expected Discharge Plan: [place] arrangements for the past 2 months: [place] Arranged: RN, PT, OT [facility] Agency: [place] ([place]) Date [place]: 02/20/20 Time [place]: 1618 Representative spoke with at [place]: [place] Arrangements/Services Living arrangements for the past 2 months: [place] with:: Parents([name] (father): [phone]) Patient language and need for interpreter reviewed:: Yes Do you feel safe going back to the place where you live?: Yes      Need for Family  Participation in Patient Care: No (Comment) Care giver support system in place?: Yes (comment) Current home services: Home RN Criminal Activity/Legal Involvement Pertinent to Current Situation/Hospitalization: No - Comment as needed  Emotional Assessment Attitude/Demeanor/Rapport: Engaged Affect (typically observed): Accepting Orientation: : Oriented to Self, Oriented to Place, Oriented to  Time, Oriented to Situation Alcohol / Substance Use: Not Applicable Psych Involvement: No (comment)  Admission diagnosis:  S/P BKA (below knee amputation) unilateral, right [facility]) [Z89.511] Patient Active Problem List   Diagnosis Date Noted  . S/P BKA (below knee amputation) unilateral, right ([place]) 02/19/2020  . Osteomyelitis of ankle and foot ([place])   . Abscess of tendon sheath, right ankle and foot 12/24/2019  . Bacteremia due to methicillin susceptible Staphylococcus aureus (MSSA) 12/24/2019  . Acute deep vein thrombosis (DVT) of right popliteal vein ([place]) 10/26/2019  . Uncontrolled type 2 diabetes mellitus with hyperglycemia, with long-term current use of insulin ([place]) 10/23/2019  . A-fib ([place]) 10/22/2019  . Lumbar spine scoliosis 08/13/2018  . S/P BKA (below knee amputation) unilateral, left ([place]) 01/12/2018  . 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 Pain management contract signed 05/04/2017  . Diabetic foot infection ([place]) 04/11/2017  . Peripheral edema 10/02/2016  . Polycythemia 12/09/2015  . Type 2 diabetes mellitus ([place]) 07/22/2015  . Osteomyelitis of toe of right foot ([place]) 06/13/2014  . Neuropathy in diabetes ([place]) 03/31/2014  . Charcot ankle 03/09/2014  . Essential hypertension, benign 03/03/2013  . DDD (degenerative disc disease), lumbar 03/03/2013   PCP:  Dettinger, [name], MD Pharmacy:   [id number] - [place], [place]  [address] Phone: [phone] Fax:  [phone]  [facility] - [place], [place] - [address] [place] [address] Phone: [phone] Fax: [phone]  Readmission Risk Interventions No flowsheet data found.

## 2020-02-21 LAB — CBC
HCT: 26.6 % — ABNORMAL LOW (ref 39.0–52.0)
Hemoglobin: 8.2 g/dL — ABNORMAL LOW (ref 13.0–17.0)
MCH: 26.5 pg (ref 26.0–34.0)
MCHC: 30.8 g/dL (ref 30.0–36.0)
MCV: 86.1 fL (ref 80.0–100.0)
Platelets: 338 10*3/uL (ref 150–400)
RBC: 3.09 MIL/uL — ABNORMAL LOW (ref 4.22–5.81)
RDW: 13.8 % (ref 11.5–15.5)
WBC: 4.5 10*3/uL (ref 4.0–10.5)
nRBC: 0 % (ref 0.0–0.2)

## 2020-02-21 LAB — BASIC METABOLIC PANEL
Anion gap: 8 (ref 5–15)
BUN: 8 mg/dL (ref 6–20)
CO2: 29 mmol/L (ref 22–32)
Calcium: 8.1 mg/dL — ABNORMAL LOW (ref 8.9–10.3)
Chloride: 101 mmol/L (ref 98–111)
Creatinine, Ser: 0.68 mg/dL (ref 0.61–1.24)
GFR calc Af Amer: >60 mL/min (ref >60)
GFR calc non Af Amer: >60 mL/min (ref >60)
Glucose, Bld: 130 mg/dL — ABNORMAL HIGH (ref 70–99)
Potassium: 4.7 mmol/L (ref 3.5–5.1)
Sodium: 138 mmol/L (ref 135–145)

## 2020-02-21 LAB — GLUCOSE, CAPILLARY
Glucose-Capillary: 121 mg/dL — ABNORMAL HIGH (ref 70–99)
Glucose-Capillary: 162 mg/dL — ABNORMAL HIGH (ref 70–99)
Glucose-Capillary: 182 mg/dL — ABNORMAL HIGH (ref 70–99)
Glucose-Capillary: 168 mg/dL — ABNORMAL HIGH (ref 70–99)

## 2020-02-21 LAB — MAGNESIUM: Magnesium: 1.6 mg/dL — ABNORMAL LOW (ref 1.7–2.4)

## 2020-02-21 MED ORDER — MAGNESIUM SULFATE 2 GM/50ML IV SOLN
2.0000 g | Freq: Once | INTRAVENOUS | Status: AC
  Administered 2020-02-21: 2 g via INTRAVENOUS
  Filled 2020-02-21: qty 50

## 2020-02-21 NOTE — Progress Notes (Signed)
Physical Therapy Treatment Patient Details Name: [name] MRN: [id number] DOB: 1966-01-20 Today's Date: 02/21/2020    History of Present Illness PT is a 54 yo male who has a Lt BKA. 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   He is currently being referred to skilled PT for transfer training.    PT Comments    Patient apprehensive of transferring to chair today secondary to fear of increasing RLE pain or injuring RLE despite reassurance of assistance and not letting him fall. Patient willing to complete supine exercises today and is educated on continuing while in hospital to maintain/improve mobility. Patient educated on positioning of LE and body to prevent contracture in LE. Patient has no increase in symptoms following session. Patient will benefit from continued physical therapy in hospital and recommended venue below to increase strength, balance, endurance for safe ADLs and gait.   Follow Up Recommendations  Home health PT     Equipment Recommendations  3in1 (PT);Wheelchair (measurements PT)    Recommendations for Other Services       Precautions / Restrictions Precautions Precautions: Fall Precaution Comments: RLE amputation, hx L BKA, wears prothesis Restrictions Weight Bearing Restrictions: No    Mobility  Bed Mobility Overal bed mobility: Modified Independent             General bed mobility comments: patient is able to complete bed mobility without Assist  Transfers                    Ambulation/Gait                 Stairs             Wheelchair Mobility    Modified Rankin (Stroke Patients Only)       Balance                                            Cognition Arousal/Alertness: Awake/alert Behavior During Therapy: WFL for tasks assessed/performed Overall Cognitive Status: Within Functional Limits for tasks assessed                                         Exercises Amputee Exercises Quad Sets: AROM;Both;10 reps;Supine Hip Flexion/Marching: AROM;Both;10 reps;Supine Knee Flexion: AROM;Both;10 reps;Supine Knee Extension: AROM;Both;10 reps;Supine Straight Leg Raises: AROM;Both;10 reps;Supine    General Comments        Pertinent Vitals/Pain Pain Assessment: No/denies pain    Home Living                      Prior Function            PT Goals (current goals can now be found in the care plan section) Acute Rehab PT Goals Patient Stated Goal: To go home and be able to get around with his walker PT Goal Formulation: With patient Time For Goal Achievement: 02/23/20 Potential to Achieve Goals: Good Progress towards PT goals: Progressing toward goals    Frequency    Min 6X/week      PT Plan Current plan remains appropriate    Co-evaluation              AM-PAC PT "6 Clicks" Mobility  Outcome Measure  Help needed turning from your back to your side while in a flat bed without using bedrails?: None Help needed moving from lying on your back to sitting on the side of a flat bed without using bedrails?: None Help needed moving to and from a bed to a chair (including a wheelchair)?: A Little Help needed standing up from a chair using your arms (e.g., wheelchair or bedside chair)?: A Little Help needed to walk in hospital room?: A Lot Help needed climbing 3-5 steps with a railing? : A Lot 6 Click Score: 18    End of Session Equipment Utilized During Treatment: Gait belt Activity Tolerance: Patient tolerated treatment well;Patient limited by fatigue Patient left: in bed;with call bell/phone within reach;with bed alarm set;with nursing/sitter in room Nurse Communication: Mobility status PT Visit Diagnosis: Unsteadiness on feet (R26.81);Muscle weakness (generalized) (M62.81);Difficulty in walking, not elsewhere classified (R26.2)     Time: 1980-2217 PT Time Calculation (min)  (ACUTE ONLY): 9 min  Charges:  $Therapeutic Exercise: 8-22 mins                     1:47 PM, 02/21/20 Wyman Songster PT, DPT Physical Therapist at [facility]

## 2020-02-21 NOTE — Progress Notes (Signed)
[facility] Progress Note  2 Days Post-Op  Subjective: Looking good. No complaints. Says he had some phantom itching but no major pain. He says he worked with PT yesterday and did some transitioning with a walker. Blood sugars improving, H&H stable.  Objective: Vital signs in last 24 hours: Temp:  [98.1 F (36.7 C)-98.5 F (36.9 C)] 98.1 F (36.7 C) (04/17 0552) Pulse Rate:  [71-88] 88 (04/17 1000) Resp:  [15-16] 15 (04/17 0552) BP: (113-119)/(66-78) 115/78 (04/17 1000) SpO2:  [97 %-100 %] 98 % (04/17 0834) Last BM Date: 02/20/20  Intake/Output from previous day: 04/16 0701 - 04/17 0700 In: 3440.7 [P.O.:960; I.V.:1761.8; Blood:332; IV Piggyback:386.9] Out: 2800 [Urine:2800] Intake/Output this shift: No intake/output data recorded.  General appearance: alert, cooperative and no distress Resp: normal work of breathing Extremities: L BKA, R BKA dressing coming off, staples c/d/i without bleeding, drainage or erythema, dressing replaced and ACE rewrapped   Lab Results:  Recent Labs    02/20/20 0508 02/20/20 0508 02/20/20 1500 02/21/20 0709  WBC 5.5  --   --  4.5  HGB 7.1*   < > 8.4* 8.2*  HCT 23.2*   < > 26.8* 26.6*  PLT 317  --   --  338   < > = values in this interval not displayed.   BMET Recent Labs    02/20/20 0508 02/21/20 0709  NA 131* 138  K 3.6 4.7  CL 96* 101  CO2 24 29  GLUCOSE 327* 130*  BUN 16 8  CREATININE 0.72 0.68  CALCIUM 7.4* 8.1*   Anti-infectives: Anti-infectives (From admission, onward)   Start     Dose/Rate Route Frequency Ordered Stop   02/19/20 1600  ceFAZolin (ANCEF) IVPB 2g/100 mL premix     2 g 200 mL/hr over 30 Minutes Intravenous Every 8 hours 02/19/20 1545        Assessment/Plan: Mr. Rhodes is a 54 yo POD 2 s/p R BKA and history of prior L BKA. Doing well. Working with PT. H&H stable. Overall doing great. PRN for pain IS, OOB with assistance HD good, H/o Afib, likely restart Eliquis tomorrow  Diet as  tolerated, BS improving Mg replaced, recheck tomorrow SCDs, lovenox prophylaxis     LOS: 2 days    Lucretia Roers 02/21/2020

## 2020-02-22 LAB — GLUCOSE, CAPILLARY
Glucose-Capillary: 141 mg/dL — ABNORMAL HIGH (ref 70–99)
Glucose-Capillary: 245 mg/dL — ABNORMAL HIGH (ref 70–99)
Glucose-Capillary: 159 mg/dL — ABNORMAL HIGH (ref 70–99)
Glucose-Capillary: 227 mg/dL — ABNORMAL HIGH (ref 70–99)

## 2020-02-22 LAB — HEMOGLOBIN AND HEMATOCRIT, BLOOD
HCT: 25.3 % — ABNORMAL LOW (ref 39.0–52.0)
Hemoglobin: 7.9 g/dL — ABNORMAL LOW (ref 13.0–17.0)

## 2020-02-22 LAB — MAGNESIUM: Magnesium: 1.6 mg/dL — ABNORMAL LOW (ref 1.7–2.4)

## 2020-02-22 MED ORDER — APIXABAN 5 MG PO TABS
5.0000 mg | ORAL_TABLET | Freq: Two times a day (BID) | ORAL | Status: DC
  Administered 2020-02-22 – 2020-02-23 (×2): 5 mg via ORAL
  Filled 2020-02-22 (×2): qty 1

## 2020-02-22 MED ORDER — MAGNESIUM SULFATE 4 GM/100ML IV SOLN
4.0000 g | Freq: Once | INTRAVENOUS | Status: AC
  Administered 2020-02-22: 4 g via INTRAVENOUS
  Filled 2020-02-22: qty 100

## 2020-02-22 NOTE — Progress Notes (Signed)
[facility] Progress Note  3 Days Post-Op  Subjective: Doing well. Did hit his stump yesterday getting back into bed, no bleeding, worked some with PT but felt like he needs to do more. PT recommending home health PT and I discussed with him that home health OT also could be beneficial to help get his home safe. He does not think he needs an aid or RN at home for help otherwise.   Objective: Vital signs in last 24 hours: Temp:  [98.3 F (36.8 C)-99.5 F (37.5 C)] 99.1 F (37.3 C) (04/18 0523) Pulse Rate:  [72-81] 72 (04/18 0523) Resp:  [16-20] 16 (04/18 0523) BP: (124-132)/(74-79) 124/76 (04/18 0523) SpO2:  [97 %-100 %] 99 % (04/18 0523) Last BM Date: 02/20/20  Intake/Output from previous day: 04/17 0701 - 04/18 0700 In: 1130 [P.O.:980; IV Piggyback:150] Out: 3875 [Urine:3875] Intake/Output this shift: Total I/O In: -  Out: 600 [Urine:600]  General appearance: alert, cooperative and no distress Resp: normal work of breathing Extremities: prior L BKA, R incision c/d/ i with staples, no bleeding or bruising noted, gauze and ACE replaced   Lab Results:  Recent Labs    02/20/20 0508 02/20/20 1500 02/21/20 0709 02/22/20 0649  WBC 5.5  --  4.5  --   HGB 7.1*   < > 8.2* 7.9*  HCT 23.2*   < > 26.6* 25.3*  PLT 317  --  338  --    < > = values in this interval not displayed.   BMET Recent Labs    02/20/20 0508 02/21/20 0709  NA 131* 138  K 3.6 4.7  CL 96* 101  CO2 24 29  GLUCOSE 327* 130*  BUN 16 8  CREATININE 0.72 0.68  CALCIUM 7.4* 8.1*    Anti-infectives: Anti-infectives (From admission, onward)   Start     Dose/Rate Route Frequency Ordered Stop   02/19/20 1600  ceFAZolin (ANCEF) IVPB 2g/100 mL premix     2 g 200 mL/hr over 30 Minutes Intravenous Every 8 hours 02/19/20 1545        Assessment/Plan: Mr. Milne is a 54 yo POD 3  s/p R BKA and history of prior L BKA. Doing well.  PRN for pain IS, OOB with assistance HD good, H/o Afib,  restart Eliquis  Diet as tolerated Mg replaced, recheck tomorrow SCDs, lovenox stopped PT recommending home health PT and also patient would benefit from OT likely too    LOS: 3 days    Lucretia Roers 02/22/2020

## 2020-02-23 ENCOUNTER — Other Ambulatory Visit (INDEPENDENT_AMBULATORY_CARE_PROVIDER_SITE_OTHER): Payer: Self-pay | Admitting: General Surgery

## 2020-02-23 DIAGNOSIS — Z09 Encounter for follow-up examination after completed treatment for conditions other than malignant neoplasm: Secondary | ICD-10-CM

## 2020-02-23 DIAGNOSIS — M869 Osteomyelitis, unspecified: Secondary | ICD-10-CM

## 2020-02-23 DIAGNOSIS — Z89511 Acquired absence of right leg below knee: Secondary | ICD-10-CM

## 2020-02-23 LAB — MAGNESIUM: Magnesium: 1.8 mg/dL (ref 1.7–2.4)

## 2020-02-23 LAB — HEMOGLOBIN AND HEMATOCRIT, BLOOD
HCT: 27.4 % — ABNORMAL LOW (ref 39.0–52.0)
Hemoglobin: 8.4 g/dL — ABNORMAL LOW (ref 13.0–17.0)

## 2020-02-23 LAB — GLUCOSE, CAPILLARY
Glucose-Capillary: 125 mg/dL — ABNORMAL HIGH (ref 70–99)
Glucose-Capillary: 143 mg/dL — ABNORMAL HIGH (ref 70–99)
Glucose-Capillary: 251 mg/dL — ABNORMAL HIGH (ref 70–99)

## 2020-02-23 LAB — SURGICAL PATHOLOGY

## 2020-02-23 NOTE — Discharge Instructions (Signed)
Fax number [phone]     Stump and Prosthesis Care When an arm or leg is removed, it is important to care for the artificial body part that replaces it (prosthesis) and for the remaining end of the arm or leg (stump). Caring for the stump and prosthesis will help you stay comfortable, active, and healthy. How to care for your stump Cleaning your skin  Wash your stump with a mild antibacterial soap at least once each day.  Wash your stump after getting dirty or sweaty.  After washing your stump, pat it dry and let it air-dry for 5-10 minutes.  Do not soak your stump in a warm or hot bath for longer than 20 minutes at a time.  Avoid shaving hair on the stump. Hair that grows out after being shaved is more easily irritated by the prosthesis. Using skin care products  Apply ointment to your surgical scar if your health care provider told you to do so. This can keep the scar soft and help it heal.  Do not put creams and lotions on your stump unless directed by your health care provider. If your health care provider says it is okay to put creams and lotions on your stump, do not use lotions that contain petroleum jelly.  Do not use skin care products with an alcohol base. These products can be harmful to your skin. They can also damage the lining of the prosthesis.  Consider using an antiperspirant spray on the skin of the stump if you are prone to sweating. Other instructions  Every day, look closely at the skin on your stump. Use a mirror with a long handle to check areas you cannot see, or ask a friend or family member to check those areas. Look for areas that appear reddish, swollen, or irritated. Pay extra attention to places where the stump and prosthesis rub together. Tell your health care provider if you have concerns.  Wear the elastic wrap on your stump as told by your health care provider. The wrap may become loose or your stump may swell when you are active. Rewrap the  bandage every couple of hours as needed. How to care for your prosthesis Cleaning your prosthesis  Use hot water and antibacterial soap to wash your prosthesis.  Dry the prosthesis using a clean towel. Attaching your prosthesis  Make sure your prosthesis is clean before you attach it to your stump. Clean and dry all the parts that touch your skin.  Wear socks or wraps under the prosthesis.  Attach your prosthesis to your stump. Be sure you understand how to attach it. A prosthetic specialist (prosthetist) can show you how to do this. It is a good idea to practice several times while he or she watches. Other instructions      Exercise and move your prosthesis as told by your physical therapist.  Follow your health care provider's instructions about the length of time you should wear your prosthesis. You may be instructed to: ? Limit the amount of time you wear your prosthesis at first. ? Increase the time you wear your prosthesis a little bit each day. Where to find more information  The Amputee Coalition: https://www.amputee-coalition.org/ Contact a health care provider if:  The prosthesis does not seem to fit correctly.  You have an itchy rash or a sore on your stump.  Sweating between the stump and the prosthesis is heavy, and efforts to control the sweating do not work. Get help right away if:  Your stump is painful to the touch, or it is red, swollen, or hot.  A bad smell develops around the stump.  There is a sore on your stump that is not healing.  Your stump is colder than the upper part of your limb.  Skin on your stump turns gray or black.  There is drainage coming from your stump. Summary  When an arm or leg is removed, it is important to care for the artificial body part that replaces it (prosthesis) and for the remaining end of the arm or leg (stump).  Wash your stump with a mild antibacterial soap at least once each day or if it gets dirty or sweaty.  Then, pat it dry and let it air-dry for 5-10 minutes.  Follow your health care provider's instructions on the use of skin care products on your stump.  Every day, closely check the skin on your stump. Look for areas that appear reddish, swollen, or irritated. Tell your health care provider if you have concerns.  Make sure your prosthesis is clean before you attach it to your stump. Clean and dry all the parts that touch your skin. This information is not intended to replace advice given to you by your health care provider. Make sure you discuss any questions you have with your health care provider. Document Revised: 12/12/2017 Document Reviewed: 12/12/2017 Elsevier Patient Education  [place].

## 2020-02-23 NOTE — Discharge Summary (Signed)
Physician Discharge Summary  Patient ID: [name] MRN: [id number] DOB/AGE: 11/20/1965 54 y.o.  Admit date: 02/19/2020 Discharge date: 02/23/2020  Admission Diagnoses: Osteomyelitis of right foot and ankle, Charcot's disease  Discharge Diagnoses: Same Active Problems:   Osteomyelitis of ankle and foot (HCC)   S/P BKA (below knee amputation) unilateral, right (HCC) Insulin-dependent diabetes mellitus, proximal atrial fibrillation, chronic anticoagulation, hypertension  Discharged Condition: good  Hospital Course: Patient is a 54 year old white male status post a left below the knee amputation in the remote past who has been treated recently for osteomyelitis of the right foot and ankle.  Despite previous incision and drainage and antibiotics, the patient has had progression of the osteomyelitis significantly involving the right ankle.  In addition, he suffers from Charcot's disease.  The patient underwent a right below-knee amputation on 02/19/2020.  Tolerated the surgery well.  His postoperative course was remarkable for needing 1 unit of packed red blood cells due to acute surgical blood loss.  His hemoglobin responded appropriately.  He was also noted to be mildly hypomagnesemic and this was treated.  He did have hyperglycemia initially and this was treated with insulin.  PT was started to help with transfers.  He already has home health involved with his care.  He is being discharged home on 02/23/2020 in good and improving condition.  He should continue his oral Keflex for 1 more week.  Treatments: surgery: Right below the knee amputation on 02/19/2020  Discharge Exam: Blood pressure 119/71, pulse 67, temperature 97.8 F (36.6 C), resp. rate 16, height 6\' 4"  (1.93 m), weight 73.3 kg, SpO2 99 %. General appearance: alert, cooperative and no distress Resp: clear to auscultation bilaterally Cardio: regular rate and rhythm, S1, S2 normal, no murmur, click, rub or gallop Extremities: Right  BKA incision healing well.  No ecchymosis present.  No hematoma present.  Disposition: Discharge disposition: 01-Home or Self Care       Discharge Instructions    Diet - low sodium heart healthy   Complete by: As directed    Increase activity slowly   Complete by: As directed      Allergies as of 02/23/2020   No Known Allergies     Medication List    TAKE these medications   albuterol 108 (90 Base) MCG/ACT inhaler Commonly known as: VENTOLIN HFA Inhale 2 puffs into the lungs every 6 (six) hours as needed for wheezing or shortness of breath.   amiodarone 200 MG tablet Commonly known as: PACERONE Take 1 tablet (200 mg total) by mouth daily.   apixaban 5 MG Tabs tablet Commonly known as: ELIQUIS Take 1 tablet (5 mg total) by mouth 2 (two) times daily.   atorvastatin 20 MG tablet Commonly known as: LIPITOR Take 1 tablet (20 mg total) by mouth daily.   cephALEXin 500 MG capsule Commonly known as: KEFLEX Take 1 capsule (500 mg total) by mouth 3 (three) times daily.   diphenhydrAMINE 25 mg capsule Commonly known as: BENADRYL Take 25 mg by mouth daily as needed for itching or sleep. To be given prior to Vancomycin administration due to rash (allergic reaction)   fluticasone 50 MCG/ACT nasal spray Commonly known as: FLONASE Place 1 spray into both nostrils 2 (two) times daily as needed for allergies or rhinitis.   gabapentin 600 MG tablet Commonly known as: NEURONTIN Take 1 tablet (600 mg total) by mouth at bedtime.   HYDROcodone-acetaminophen 10-325 MG tablet Commonly known as: NORCO Take 1 tablet by mouth 2 (two)  times daily as needed. What changed:   when to take this  reasons to take this   metFORMIN 1000 MG tablet Commonly known as: GLUCOPHAGE Take 1 tablet (1,000 mg total) by mouth 2 (two) times daily.   multivitamin with minerals tablet Take 1 tablet by mouth daily.   Toujeo Max SoloStar 300 UNIT/ML Solostar Pen Generic drug: insulin glargine (2  Unit Dial) Inject 40-50 Units into the skin daily. What changed: how much to take   zinc gluconate 50 MG tablet Take 50 mg by mouth daily.      Follow-up Information    Aviva Signs, MD. Schedule an appointment as soon as possible for a visit on 03/11/2020.   Specialty: General Surgery Contact information: [address] [address] [phone]           Signed: Aviva Signs 02/23/2020, 9:50 AM

## 2020-02-23 NOTE — Progress Notes (Signed)
Physical Therapy Treatment Patient Details Name: [name] MRN: [id number] DOB: 01-Apr-1966 Today's Date: 02/23/2020    History of Present Illness PT is a 54 yo male who has a Lt BKA. 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   He is currently being referred to skilled PT for transfer training.    PT Comments    Patient demonstrates good return for donning/doffing LLE BKA prosthetic leg, limited for standing with RW due to LLE weakness and preferred to transfer to wheelchair by partial stand pivoting with good return demonstrated.  Patient will benefit from continued physical therapy in hospital and recommended venue below to increase strength, balance, endurance for safe ADLs and gait.   Follow Up Recommendations  Home health PT     Equipment Recommendations  3in1 (PT);Wheelchair (measurements PT);Wheelchair cushion (measurements PT)    Recommendations for Other Services       Precautions / Restrictions Precautions Precautions: Fall Precaution Comments: RLE amputation, hx L BKA, wears prothesis Restrictions Weight Bearing Restrictions: Yes RLE Weight Bearing: Non weight bearing    Mobility  Bed Mobility Overal bed mobility: Modified Independent                Transfers Overall transfer level: Needs assistance Equipment used: Rolling walker (2 wheeled) Transfers: Stand Pivot Transfers;Sit to/from Stand Sit to Stand: Supervision Stand pivot transfers: Supervision       General transfer comment: limited for standing with RW due to c/o LLE weakness, good return for bed/wheelchair partial stand pivot  Ambulation/Gait                 Stairs             Wheelchair Mobility    Modified Rankin (Stroke Patients Only)       Balance Overall balance assessment: Needs assistance Sitting-balance support: Feet supported;No upper extremity supported Sitting balance-Leahy Scale: Good Sitting  balance - Comments: seated at EOB   Standing balance support: During functional activity;Bilateral upper extremity supported Standing balance-Leahy Scale: Poor Standing balance comment: fair/poor using RW                            Cognition Arousal/Alertness: Awake/alert Behavior During Therapy: WFL for tasks assessed/performed Overall Cognitive Status: Within Functional Limits for tasks assessed                                        Exercises      General Comments        Pertinent Vitals/Pain Pain Assessment: Faces Faces Pain Scale: Hurts a little bit Pain Location: RLE Pain Descriptors / Indicators: Discomfort Pain Intervention(s): Limited activity within patient's tolerance;Monitored during session    Home Living                      Prior Function            PT Goals (current goals can now be found in the care plan section) Acute Rehab PT Goals Patient Stated Goal: To go home and be able to get around with his walker PT Goal Formulation: With patient Time For Goal Achievement: 02/23/20 Potential to Achieve Goals: Good Progress towards PT goals: Progressing toward goals    Frequency    Min 6X/week  PT Plan Current plan remains appropriate    Co-evaluation              AM-PAC PT "6 Clicks" Mobility   Outcome Measure  Help needed turning from your back to your side while in a flat bed without using bedrails?: None Help needed moving from lying on your back to sitting on the side of a flat bed without using bedrails?: None Help needed moving to and from a bed to a chair (including a wheelchair)?: A Little Help needed standing up from a chair using your arms (e.g., wheelchair or bedside chair)?: A Little Help needed to walk in hospital room?: A Lot Help needed climbing 3-5 steps with a railing? : A Lot 6 Click Score: 18    End of Session   Activity Tolerance: Patient tolerated treatment well;Patient  limited by fatigue Patient left: in bed;with call bell/phone within reach Nurse Communication: Mobility status PT Visit Diagnosis: Unsteadiness on feet (R26.81);Muscle weakness (generalized) (M62.81);Difficulty in walking, not elsewhere classified (R26.2)     Time: 3532-9924 PT Time Calculation (min) (ACUTE ONLY): 13 min  Charges:  $Therapeutic Activity: 8-22 mins                     10:27 AM, 02/23/20 [name], MPT Physical Therapist with [facility] [phone] office [phone] mobile phone

## 2020-02-23 NOTE — Progress Notes (Signed)
Nsg Discharge Note  Admit Date:  02/19/2020 Discharge date: 02/23/2020   [name] to be D/C'd home per MD order.  AVS completed.  Copy for chart, and copy for patient signed, and dated. Patient/caregiver able to verbalize understanding.  Discharge Medication: Allergies as of 02/23/2020   No Known Allergies     Medication List    TAKE these medications   albuterol 108 (90 Base) MCG/ACT inhaler Commonly known as: VENTOLIN HFA Inhale 2 puffs into the lungs every 6 (six) hours as needed for wheezing or shortness of breath.   amiodarone 200 MG tablet Commonly known as: PACERONE Take 1 tablet (200 mg total) by mouth daily.   apixaban 5 MG Tabs tablet Commonly known as: ELIQUIS Take 1 tablet (5 mg total) by mouth 2 (two) times daily.   atorvastatin 20 MG tablet Commonly known as: LIPITOR Take 1 tablet (20 mg total) by mouth daily.   cephALEXin 500 MG capsule Commonly known as: KEFLEX Take 1 capsule (500 mg total) by mouth 3 (three) times daily.   diphenhydrAMINE 25 mg capsule Commonly known as: BENADRYL Take 25 mg by mouth daily as needed for itching or sleep. To be given prior to Vancomycin administration due to rash (allergic reaction)   fluticasone 50 MCG/ACT nasal spray Commonly known as: FLONASE Place 1 spray into both nostrils 2 (two) times daily as needed for allergies or rhinitis.   gabapentin 600 MG tablet Commonly known as: NEURONTIN Take 1 tablet (600 mg total) by mouth at bedtime.   HYDROcodone-acetaminophen 10-325 MG tablet Commonly known as: NORCO Take 1 tablet by mouth 2 (two) times daily as needed. What changed:   when to take this  reasons to take this   metFORMIN 1000 MG tablet Commonly known as: GLUCOPHAGE Take 1 tablet (1,000 mg total) by mouth 2 (two) times daily.   multivitamin with minerals tablet Take 1 tablet by mouth daily.   Toujeo Max SoloStar 300 UNIT/ML Solostar Pen Generic drug: insulin glargine (2 Unit Dial) Inject 40-50  Units into the skin daily. What changed: how much to take   zinc gluconate 50 MG tablet Take 50 mg by mouth daily.       Discharge Assessment: Vitals:   02/22/20 2054 02/23/20 0529  BP: 124/70 119/71  Pulse: 82 67  Resp: 16 16  Temp: 99.2 F (37.3 C) 97.8 F (36.6 C)  SpO2: 100% 99%   Skin clean, dry and intact without evidence of skin break down, no evidence of skin tears noted. IV catheter discontinued intact. Site without signs and symptoms of complications - no redness or edema noted at insertion site, patient denies c/o pain - only slight tenderness at site.  Dressing with slight pressure applied.  D/c Instructions-Education: Discharge instructions given to patient/family with verbalized understanding. D/c education completed with patient/family including follow up instructions, medication list, d/c activities limitations if indicated, with other d/c instructions as indicated by MD - patient able to verbalize understanding, all questions fully answered. Patient instructed to return to ED, call 911, or call MD for any changes in condition.  Patient escorted via WC, and D/C home via private auto.  Carole Civil, RN 02/23/2020 1230

## 2020-02-23 NOTE — Care Management Important Message (Signed)
Important Message  Patient Details  Name: [name] MRN: [id number] Date of Birth: 02-May-1966   Medicare Important Message Given:  Yes     Corey Harold 02/23/2020, 12:13 PM

## 2020-02-25 ENCOUNTER — Ambulatory Visit (INDEPENDENT_AMBULATORY_CARE_PROVIDER_SITE_OTHER): Payer: Medicare Other | Admitting: Family Medicine

## 2020-02-25 NOTE — Progress Notes (Signed)
Called patient and left a voicemail, called again, no answer. Arville Care, MD [facility] 02/25/2020, 9:48 AM

## 2020-02-26 LAB — TYPE AND SCREEN
ABO/RH(D): O NEG
Antibody Screen: NEGATIVE
Unit division: 0
Unit division: 0

## 2020-02-26 LAB — BPAM RBC
Blood Product Expiration Date: 202105142359
Blood Product Expiration Date: 202105192359
ISSUE DATE / TIME: 202104161040
Unit Type and Rh: 9500
Unit Type and Rh: 9500

## 2020-03-02 ENCOUNTER — Encounter: Payer: Self-pay | Admitting: Internal Medicine

## 2020-03-02 ENCOUNTER — Other Ambulatory Visit: Payer: Self-pay

## 2020-03-02 ENCOUNTER — Telehealth (INDEPENDENT_AMBULATORY_CARE_PROVIDER_SITE_OTHER): Payer: Medicare Other | Admitting: Internal Medicine

## 2020-03-02 VITALS — Ht 76.0 in | Wt 164.0 lb

## 2020-03-02 DIAGNOSIS — Z89511 Acquired absence of right leg below knee: Secondary | ICD-10-CM

## 2020-03-02 DIAGNOSIS — Z09 Encounter for follow-up examination after completed treatment for conditions other than malignant neoplasm: Secondary | ICD-10-CM

## 2020-03-02 DIAGNOSIS — E11628 Type 2 diabetes mellitus with other skin complications: Secondary | ICD-10-CM

## 2020-03-02 DIAGNOSIS — L089 Local infection of the skin and subcutaneous tissue, unspecified: Secondary | ICD-10-CM

## 2020-03-02 NOTE — Progress Notes (Signed)
Virtual Visit via Telephone Note  I connected with [name] on 03/02/20 at  4:15 PM EDT by telephone and verified that I am speaking with the correct person using two identifiers.  Location: Patient: Home Provider: [facility]   I discussed the limitations, risks, security and privacy concerns of performing an evaluation and management service by telephone and the availability of in person appointments. I also discussed with the patient that there may be a patient responsible charge related to this service. The patient expressed understanding and agreed to proceed.   History of Present Illness: I called and spoke with [name] today.  He has been hospitalized several times recently due to complications of a right diabetic foot infection and MSSA bacteremia.  He completed 8 weeks of antibiotic therapy on 01/22/2020 but continued to feel bad.  He said he was sleeping all of the time.  His podiatrist, Dr. Nolen Mu referred him to Dr. [name] who recommended right BKA.  That surgery was performed on 02/19/2020.  After surgery he started taking leftover cephalexin again.  He has not had any fever, chills, sweats and he states his wound is healing well.  He says he feels better than he has in 6 months.   Observations/Objective:   Assessment and Plan: Strongly suspect that his infection has been cured through a combination of BKA and prolonged antibiotics.  I instructed him to stop cephalexin now.  Follow Up Instructions: Stop cephalexin now He can follow-up here as needed   I discussed the assessment and treatment plan with the patient. The patient was provided an opportunity to ask questions and all were answered. The patient agreed with the plan and demonstrated an understanding of the instructions.   The patient was advised to call back or seek an in-person evaluation if the symptoms worsen or if the condition fails to improve as anticipated.  I provided 14 minutes of non-face-to-face time  during this encounter.   Cliffton Asters, MD

## 2020-03-03 ENCOUNTER — Encounter: Payer: Self-pay | Admitting: Family Medicine

## 2020-03-03 ENCOUNTER — Telehealth (INDEPENDENT_AMBULATORY_CARE_PROVIDER_SITE_OTHER): Payer: Medicare Other | Admitting: Family Medicine

## 2020-03-03 DIAGNOSIS — E11610 Type 2 diabetes mellitus with diabetic neuropathic arthropathy: Secondary | ICD-10-CM

## 2020-03-03 DIAGNOSIS — Z89512 Acquired absence of left leg below knee: Secondary | ICD-10-CM

## 2020-03-03 DIAGNOSIS — I10 Essential (primary) hypertension: Secondary | ICD-10-CM

## 2020-03-03 DIAGNOSIS — E1142 Type 2 diabetes mellitus with diabetic polyneuropathy: Secondary | ICD-10-CM | POA: Diagnosis not present

## 2020-03-03 DIAGNOSIS — D500 Iron deficiency anemia secondary to blood loss (chronic): Secondary | ICD-10-CM

## 2020-03-03 DIAGNOSIS — Z0289 Encounter for other administrative examinations: Secondary | ICD-10-CM

## 2020-03-03 DIAGNOSIS — E1165 Type 2 diabetes mellitus with hyperglycemia: Secondary | ICD-10-CM

## 2020-03-03 DIAGNOSIS — Z794 Long term (current) use of insulin: Secondary | ICD-10-CM

## 2020-03-03 DIAGNOSIS — G8929 Other chronic pain: Secondary | ICD-10-CM

## 2020-03-03 DIAGNOSIS — M79673 Pain in unspecified foot: Secondary | ICD-10-CM

## 2020-03-03 MED ORDER — HYDROCODONE-ACETAMINOPHEN 10-325 MG PO TABS: 1.0000 | ORAL_TABLET | Freq: Two times a day (BID) | ORAL | 0 refills | Status: DC | PRN

## 2020-03-03 NOTE — Progress Notes (Signed)
Virtual Visit via mychart video Note  I connected with [name] on 03/03/20 at [phone] by video and verified that I am speaking with the correct person using two identifiers. [name] is currently located at home and no other people are currently with her during visit. The provider, [name] Malavika Lira, MD is located in their office at time of visit.  Call ended at 1005  I discussed the limitations, risks, security and privacy concerns of performing an evaluation and management service by video and the availability of in person appointments. I also discussed with the patient that there may be a patient responsible charge related to this service. The patient expressed understanding and agreed to proceed.  112/70s History and Present Illness: Patient just had his right foot amputated BKA on 02/19/20.  He was having bleeding and blood counts were down.  He is having physical therapy coming by and working with [employer] to get prosthetics for his right leg as well.   Type 2 diabetes mellitus Patient comes in today for recheck of his diabetes. Patient has been currently taking toujeo 54 and metformin, he has not put his sensor and is using dexcom. Patient is not currently on an ACE inhibitor/ARB. Patient has not seen an ophthalmologist this year. Patient has bilateral bkas and is doing well with the most recent.  He has not checked BS  No diagnosis found.  Outpatient Encounter Medications as of 03/03/2020  Medication Sig  . albuterol (PROVENTIL HFA;VENTOLIN HFA) 108 (90 Base) MCG/ACT inhaler Inhale 2 puffs into the lungs every 6 (six) hours as needed for wheezing or shortness of breath.  Marland Kitchen amiodarone (PACERONE) 200 MG tablet Take 1 tablet (200 mg total) by mouth daily.  Marland Kitchen apixaban (ELIQUIS) 5 MG TABS tablet Take 1 tablet (5 mg total) by mouth 2 (two) times daily.  Marland Kitchen atorvastatin (LIPITOR) 20 MG tablet Take 1 tablet (20 mg total) by mouth daily.  . cephALEXin (KEFLEX) 500 MG capsule Take 1  capsule (500 mg total) by mouth 3 (three) times daily.  . diphenhydrAMINE (BENADRYL) 25 mg capsule Take 25 mg by mouth daily as needed for itching or sleep. To be given prior to Vancomycin administration due to rash (allergic reaction)   . fluticasone (FLONASE) 50 MCG/ACT nasal spray Place 1 spray into both nostrils 2 (two) times daily as needed for allergies or rhinitis.  Marland Kitchen gabapentin (NEURONTIN) 600 MG tablet Take 1 tablet (600 mg total) by mouth at bedtime.  Marland Kitchen HYDROcodone-acetaminophen (NORCO) 10-325 MG tablet Take 1 tablet by mouth 2 (two) times daily as needed. (Patient taking differently: Take 1 tablet by mouth daily as needed for moderate pain or severe pain. )  . Insulin Glargine, 2 Unit Dial, (TOUJEO MAX SOLOSTAR) 300 UNIT/ML SOPN Inject 40-50 Units into the skin daily. (Patient taking differently: Inject 40-66 Units into the skin daily. )  . metFORMIN (GLUCOPHAGE) 1000 MG tablet Take 1 tablet (1,000 mg total) by mouth 2 (two) times daily.  . Multiple Vitamins-Minerals (MULTIVITAMIN WITH MINERALS) tablet Take 1 tablet by mouth daily.  Marland Kitchen zinc gluconate 50 MG tablet Take 50 mg by mouth daily.   No facility-administered encounter medications on file as of 03/03/2020.    Review of Systems  Constitutional: Negative for chills and fever.  Eyes: Negative for visual disturbance.  Respiratory: Negative for shortness of breath and wheezing.   Cardiovascular: Negative for chest pain and leg swelling.  Musculoskeletal: Negative for back pain and gait problem.  Skin: Negative  for rash.  Neurological: Negative for dizziness, weakness and numbness.  All other systems reviewed and are negative.   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  He was anemic in the hospital because of blood loss from the surgery and previous surgery and will check those blood counts.  He will come in his symptoms he can to check these again. Follow up plan: Return in about 4 weeks (around 03/31/2020), or if symptoms worsen or fail to improve.      I discussed the assessment and treatment plan with the patient. The patient was provided an opportunity to ask questions and all were answered. The patient agreed with the plan and demonstrated an understanding of the instructions.   The patient was advised to call back or seek an in-person evaluation if the symptoms worsen or if the condition fails to improve as anticipated.  The above assessment and management plan was discussed with the patient. The patient verbalized understanding of and has agreed to the management plan. Patient is aware to call the clinic if symptoms persist or worsen. Patient is aware when to return to the clinic for a follow-up visit. Patient educated on when it is appropriate to go to the emergency department.    I provided 18 minutes of non-face-to-face time during this encounter.    [name], MD

## 2020-03-09 ENCOUNTER — Other Ambulatory Visit: Payer: Self-pay

## 2020-03-09 ENCOUNTER — Encounter: Payer: Self-pay | Admitting: General Surgery

## 2020-03-09 ENCOUNTER — Ambulatory Visit (INDEPENDENT_AMBULATORY_CARE_PROVIDER_SITE_OTHER): Payer: Self-pay | Admitting: General Surgery

## 2020-03-09 VITALS — BP 131/91 | HR 92 | Temp 97.8°F | Resp 14 | Ht 76.0 in | Wt 150.0 lb

## 2020-03-09 DIAGNOSIS — Z09 Encounter for follow-up examination after completed treatment for conditions other than malignant neoplasm: Secondary | ICD-10-CM

## 2020-03-09 NOTE — Progress Notes (Addendum)
Subjective:     [name]  Here for postoperative visit, status post right BKA.  Patient states he is doing very well.  Has no significant pain.  Has been wearing a stump sock.  He is already hooked up with a prosthesis company.  He is taking his Eliquis. Objective:    BP (!) 131/91   Pulse 92   Temp 97.8 F (36.6 C) (Oral)   Resp 14   Ht 6\' 4"  (1.93 m)   Wt 150 lb (68 kg)   SpO2 97%   BMI 18.26 kg/m   General:  alert, cooperative and no distress  Right BKA incision healing very well.  No seroma hematoma present.  No drainage is noted.  Staples removed, Steri-Strips applied. Final pathology reviewed     Assessment:    Doing well postoperatively.    Plan:  Patient required a right below the knee amputation due to osteomyelitis of the right ankle and foot, Charcot's disease.  He had failed medical therapy.  He now requires a right below the knee prosthesis to improve his mobility and have the ability to function with the prosthesis to perform his ADLs and to continue his active lifestyle.  He is now a bilateral lower extremity amputee and is currently hopping around with a walker and his left prosthesis.  He is a functional K3 level ambulator and exhibits the ability and has the motivation to traverse in the community and at home with a right prosthesis. Follow-up here as needed.  May be fitted for prosthesis as per the prosthetic company.

## 2020-03-11 ENCOUNTER — Ambulatory Visit: Payer: Self-pay | Admitting: General Surgery

## 2020-03-29 ENCOUNTER — Other Ambulatory Visit: Payer: Self-pay

## 2020-03-29 ENCOUNTER — Ambulatory Visit (INDEPENDENT_AMBULATORY_CARE_PROVIDER_SITE_OTHER): Payer: Medicare Other | Admitting: Cardiology

## 2020-03-29 ENCOUNTER — Encounter: Payer: Self-pay | Admitting: Cardiology

## 2020-03-29 VITALS — BP 114/93 | HR 83 | Resp 11

## 2020-03-29 DIAGNOSIS — E782 Mixed hyperlipidemia: Secondary | ICD-10-CM | POA: Diagnosis not present

## 2020-03-29 DIAGNOSIS — I480 Paroxysmal atrial fibrillation: Secondary | ICD-10-CM

## 2020-03-29 DIAGNOSIS — I10 Essential (primary) hypertension: Secondary | ICD-10-CM

## 2020-03-29 MED ORDER — METOPROLOL SUCCINATE ER 25 MG PO TB24: 25.0000 mg | ORAL_TABLET | Freq: Every day | ORAL | 3 refills | Status: DC

## 2020-03-29 NOTE — Progress Notes (Signed)
Cardiology Office Note  Date: 03/29/2020   ID: [name], DOB 01/29/1966, MRN [id number]  PCP:  Dettinger, [name], MD  Cardiologist:  Rozann Lesches, MD Electrophysiologist:  None   Chief Complaint  Patient presents with  . Cardiac follow-up    History of Present Illness: [name] is a 54 y.o. male last seen in February by Ms. Strader PA-C.  He is here today for a follow-up visit.  Records indicate hospitalization in April with osteomyelitis involving the right foot and ankle ultimately requiring right BKA.  Discharge summary does not indicate any recurrent atrial fibrillation.  I reviewed his ECG from April as outlined below.  He is using a wheelchair today, has been fitted for a lower leg prosthesis.  He does not describe any sense of palpitations or chest pain.  I personally reviewed his ECG today which shows sinus rhythm with decreased R wave progression and Q waves in lead III and aVF.  CHA2DS2-VASc score is 5.  At the last visit he was continued on Eliquis for stroke prophylaxis and also low-dose amiodarone 200 mg daily.  He had already stopped Cardizem CD.  Today we discussed stopping amiodarone to reduce chance of longer term side effects and replace this with Toprol-XL 25 mg daily.  Past Medical History:  Diagnosis Date  . Charcot ankle   . Essential hypertension   . Osteomyelitis of toe of right foot ([place])   . Paroxysmal atrial fibrillation (HCC)   . Recurrent boils   . Type 2 diabetes mellitus with diabetic neuropathy [facility])    Diagnosed age 51    Past Surgical History:  Procedure Laterality Date  . AMPUTATION Right 06/17/2014   Procedure: PARTIAL AMPUTATION RIGHT 3RD TOE;  Surgeon: Marcheta Grammes, DPM;  Location: [facility];  Service: Podiatry;  Laterality: Right;  . AMPUTATION Right 09/02/2014   Procedure: PARTIAL AMPUTATION 2ND TOE RIGHT FOOT;  Surgeon: Marcheta Grammes, DPM;  Location: [facility];  Service: Podiatry;  Laterality: Right;  .  AMPUTATION Right 04/01/2015   Procedure: AMPUTATION DIGIT 1ST TOE RIGHT FOOT;  Surgeon: Juanita Laster, DPM;  Location: [facility];  Service: Podiatry;  Laterality: Right;  . AMPUTATION Right 06/29/2016   pinkeye   . AMPUTATION Right 10/24/2019   Procedure: PARTIAL THIRD RAY AMPUTATION;  Surgeon: [name], DPM;  Location: [facility];  Service: Podiatry;  Laterality: Right;  . AMPUTATION Right 02/19/2020   Procedure: AMPUTATION BELOW KNEE; RIGHT;  Surgeon: Aviva Signs, MD;  Location: [facility];  Service: General;  Laterality: Right;  . FOOT AMPUTATION Left   . FOOT SURGERY Left 03/2017  . HERNIA REPAIR Bilateral    and 3rd hernia repair does not remember ehat side  . IRRIGATION AND DEBRIDEMENT ABSCESS Right 11/05/2019   Procedure: IRRIGATION AND DRAINAGE ABSCESS RIGHT ANKLE;  Surgeon: [name], DPM;  Location: [facility];  Service: Podiatry;  Laterality: Right;  . TEE WITHOUT CARDIOVERSION N/A 10/29/2019   Procedure: TRANSESOPHAGEAL ECHOCARDIOGRAM (TEE) WITH PROPOFOL;  Surgeon: Fay Records, MD;  Location: [facility] END[facility] SUITE;  Service: Cardiovascular;  Laterality: N/A;    Current Outpatient Medications  Medication Sig Dispense Refill  . albuterol (PROVENTIL HFA;VENTOLIN HFA) 108 (90 Base) MCG/ACT inhaler Inhale 2 puffs into the lungs every 6 (six) hours as needed for wheezing or shortness of breath. 1 Inhaler 0  . amiodarone (PACERONE) 200 MG tablet Take 1 tablet (200 mg total) by mouth daily. 60 tablet 1  . apixaban (ELIQUIS) 5 MG TABS  tablet Take 1 tablet (5 mg total) by mouth 2 (two) times daily. 60 tablet 11  . atorvastatin (LIPITOR) 20 MG tablet Take 1 tablet (20 mg total) by mouth daily. 90 tablet 3  . diphenhydrAMINE (BENADRYL) 25 mg capsule Take 25 mg by mouth daily as needed for itching or sleep. To be given prior to Vancomycin administration due to rash (allergic reaction)     . fluticasone (FLONASE) 50 MCG/ACT nasal spray Place 1 spray into both nostrils 2 (two) times daily as needed  for allergies or rhinitis. 16 g 6  . HYDROcodone-acetaminophen (NORCO) 10-325 MG tablet Take 1 tablet by mouth 2 (two) times daily as needed. 60 tablet 0  . Insulin Glargine, 2 Unit Dial, (TOUJEO MAX SOLOSTAR) 300 UNIT/ML SOPN Inject 40-50 Units into the skin daily. (Patient taking differently: Inject 40-66 Units into the skin daily. ) 5 pen 5  . metFORMIN (GLUCOPHAGE) 1000 MG tablet Take 1 tablet (1,000 mg total) by mouth 2 (two) times daily. 180 tablet 3  . Multiple Vitamins-Minerals (MULTIVITAMIN WITH MINERALS) tablet Take 1 tablet by mouth daily.    . Multiple Vitamins-Minerals (THERA-M) TABS Take by mouth.    . zinc gluconate 50 MG tablet Take 50 mg by mouth daily.    . metoprolol succinate (TOPROL XL) 25 MG 24 hr tablet Take 1 tablet (25 mg total) by mouth daily. 90 tablet 3   No current facility-administered medications for this visit.   Allergies:  Patient has no known allergies.   ROS:  Reports nail changes, possibly onychomycosis.  Physical Exam: VS:  BP (!) 114/93 (BP Location: Right Arm, Patient Position: Sitting, Cuff Size: Normal)   Pulse 83   Resp 11 , BMI There is no height or weight on file to calculate BMI.  Wt Readings from Last 3 Encounters:  03/09/20 150 lb (68 kg)  03/02/20 164 lb (74.4 kg)  02/19/20 161 lb 9.6 oz (73.3 kg)    General: Patient appears comfortable at rest.  Using a wheelchair. HEENT: Conjunctiva and lids normal, wearing a mask. Neck: Supple, no elevated JVP or carotid bruits, no thyromegaly. Lungs: Clear to auscultation, nonlabored breathing at rest. Cardiac: Regular rate and rhythm, no S3 or significant systolic murmur, no pericardial rub. Extremities:  Status post right BKA.  ECG:  An ECG dated 02/18/2020 was personally reviewed today and demonstrated:  Sinus rhythm with nonspecific T wave changes.  Recent Labwork: 10/23/2019: TSH 0.929 11/04/2019: ALT 17; AST 18 02/21/2020: BUN 8; Creatinine, Ser 0.68; Platelets 338; Potassium 4.7; Sodium  138 02/23/2020: Hemoglobin 8.4; Magnesium 1.8     Component Value Date/Time   CHOL 126 07/22/2019 1310   TRIG 189 (H) 07/22/2019 1310   HDL 31 (L) 07/22/2019 1310   CHOLHDL 4.1 07/22/2019 1310   CHOLHDL 4.6 01/30/2018 1347   VLDL 54 (H) 01/30/2018 1347   LDLCALC 63 07/22/2019 1310    Other Studies Reviewed Today:  TEE 10/29/2019: 1. Left ventricular ejection fraction, by visual estimation, is 60 to  65%. The left ventricle has normal function. There is no left ventricular  hypertrophy.  2. The left ventricle has no regional wall motion abnormalities.  3. Global right ventricle has normal systolic function.The right  ventricular size is normal. Right vetricular wall thickness was not  assessed.  4. Left atrial size was normal.  5. Right atrial size was normal.  6. The mitral valve is normal in structure. No evidence of mitral valve  regurgitation.  7. The tricuspid valve is normal  in structure.  8. The aortic valve is normal in structure. Aortic valve regurgitation is  not visualized.  9. The pulmonic valve was normal in structure. Pulmonic valve  regurgitation is trivial.  Assessment and Plan:  1.  History of atrial fibrillation, possibly paroxysmal with initial diagnosis in December 2020 in the setting of sepsis and osteomyelitis.  He does not report any recurring palpitations and is in sinus rhythm today by ECG.  He remains on Eliquis for stroke prophylaxis with CHA2DS2-VASc score of 5.  We are planning to stop amiodarone at this time to reduce chances of longer-term side effects.  Start Toprol-XL 25 mg daily and continue observation.  Check CBC and BMET for next visit.  2.  Essential hypertension.  3.  Mixed hyperlipidemia, on Lipitor.  Last LDL 63.  Medication Adjustments/Labs and Tests Ordered: Current medicines are reviewed at length with the patient today.  Concerns regarding medicines are outlined above.   Tests Ordered: Orders Placed This Encounter    Procedures  . EKG 12-Lead    Medication Changes: Meds ordered this encounter  Medications  . metoprolol succinate (TOPROL XL) 25 MG 24 hr tablet    Sig: Take 1 tablet (25 mg total) by mouth daily.    Dispense:  90 tablet    Refill:  3    Disposition:  Follow up 3 months in the [place] office.  Signed, Jonelle Sidle, MD, [facility] 03/29/2020 3:24 PM    [facility] [address] [place], [place] [address] Phone: [phone]; Fax: [phone]

## 2020-03-29 NOTE — Patient Instructions (Signed)
Medication Instructions:  STOP AMIODARONE START TOPROL XL 25 MG A DAY  *If you need a refill on your cardiac medications before your next appointment, please call your pharmacy*   Lab Work: BMET AND CBC AT YOUR 3 MONTH VISIT WITH DR. MCDOWELL If you have labs (blood work) drawn today and your tests are completely normal, you will receive your results only by: Marland Kitchen MyChart Message (if you have MyChart) OR . A paper copy in the mail If you have any lab test that is abnormal or we need to change your treatment, we will call you to review the results.   Testing/Procedures: NONE   Follow-Up: At [facility], you and your health needs are our priority.  As part of our continuing mission to provide you with exceptional heart care, we have created designated Provider Care Teams.  These Care Teams include your primary Cardiologist (physician) and Advanced Practice Providers (APPs -  Physician Assistants and Nurse Practitioners) who all work together to provide you with the care you need, when you need it.  We recommend signing up for the patient portal called "MyChart".  Sign up information is provided on this After Visit Summary.  MyChart is used to connect with patients for Virtual Visits (Telemedicine).  Patients are able to view lab/test results, encounter notes, upcoming appointments, etc.  Non-urgent messages can be sent to your provider as well.   To learn more about what you can do with MyChart, go to [email or web address].    Your next appointment:   3 month(s)  The format for your next appointment:   In Person  Provider:   Nona Dell, MD   Other Instructions

## 2020-04-01 ENCOUNTER — Other Ambulatory Visit: Payer: Self-pay

## 2020-04-01 ENCOUNTER — Ambulatory Visit (INDEPENDENT_AMBULATORY_CARE_PROVIDER_SITE_OTHER): Payer: Self-pay | Admitting: General Surgery

## 2020-04-01 ENCOUNTER — Encounter: Payer: Self-pay | Admitting: General Surgery

## 2020-04-01 VITALS — BP 144/89 | HR 78 | Temp 98.5°F | Resp 18 | Ht 76.0 in | Wt 150.0 lb

## 2020-04-01 DIAGNOSIS — Z09 Encounter for follow-up examination after completed treatment for conditions other than malignant neoplasm: Secondary | ICD-10-CM

## 2020-04-01 NOTE — Progress Notes (Signed)
Subjective:     [name]  Presents with a small superficial along his right BKA incision.  Have a small amount of bloody drainage present.  No fevers have been noted. Objective:    BP (!) 144/89   Pulse 78   Temp 98.5 F (36.9 C) (Oral)   Resp 18   Ht 6\' 4"  (1.93 m)   Wt 150 lb (68 kg)   SpO2 97%   BMI 18.26 kg/m   General:  alert, cooperative and no distress  Right BKA with 1 cm superficial opening with mild erythema present.  Silver nitrate applied.  No purulent drainage expressed.  Steri-Strips applied.     Assessment:    Small superficial wound dehiscence, most likely hematoma, though erythema is present.    Plan:   Patient will keep wound clean and dry.  He will take Keflex 3 times a day for 1 week.  Follow-up here in 2 weeks.  He was instructed to see me sooner should it worsen.

## 2020-04-12 DIAGNOSIS — Z89511 Acquired absence of right leg below knee: Secondary | ICD-10-CM | POA: Diagnosis not present

## 2020-04-13 ENCOUNTER — Ambulatory Visit: Payer: Self-pay | Admitting: General Surgery

## 2020-04-19 ENCOUNTER — Other Ambulatory Visit: Payer: Self-pay | Admitting: Family Medicine

## 2020-04-19 ENCOUNTER — Other Ambulatory Visit: Payer: Self-pay | Admitting: Student

## 2020-04-19 DIAGNOSIS — E1142 Type 2 diabetes mellitus with diabetic polyneuropathy: Secondary | ICD-10-CM

## 2020-04-20 ENCOUNTER — Other Ambulatory Visit: Payer: Self-pay | Admitting: Family Medicine

## 2020-04-20 NOTE — Telephone Encounter (Signed)
°  Prescription Request  04/20/2020  What is the name of the medication or equipment? Hydrocodone  Have you contacted your pharmacy to request a refill? (if applicable) yes  Which pharmacy would you like this sent to? [facility]   Patient notified that their request is being sent to the clinical staff for review and that they should receive a response within 2 business days.

## 2020-04-20 NOTE — Telephone Encounter (Signed)
Appointment scheduled on 04/23/2020 at 11:10 am with Dr. Louanne Skye.

## 2020-04-23 ENCOUNTER — Other Ambulatory Visit: Payer: Self-pay

## 2020-04-23 ENCOUNTER — Ambulatory Visit (INDEPENDENT_AMBULATORY_CARE_PROVIDER_SITE_OTHER): Payer: Medicare Other | Admitting: Family Medicine

## 2020-04-23 ENCOUNTER — Encounter: Payer: Self-pay | Admitting: Family Medicine

## 2020-04-23 VITALS — BP 125/80 | HR 79 | Temp 97.9°F | Ht 76.0 in | Wt 188.0 lb

## 2020-04-23 DIAGNOSIS — E1142 Type 2 diabetes mellitus with diabetic polyneuropathy: Secondary | ICD-10-CM

## 2020-04-23 DIAGNOSIS — I10 Essential (primary) hypertension: Secondary | ICD-10-CM

## 2020-04-23 DIAGNOSIS — Z89511 Acquired absence of right leg below knee: Secondary | ICD-10-CM

## 2020-04-23 DIAGNOSIS — Z89512 Acquired absence of left leg below knee: Secondary | ICD-10-CM

## 2020-04-23 DIAGNOSIS — E1165 Type 2 diabetes mellitus with hyperglycemia: Secondary | ICD-10-CM

## 2020-04-23 DIAGNOSIS — Z0289 Encounter for other administrative examinations: Secondary | ICD-10-CM

## 2020-04-23 DIAGNOSIS — Z794 Long term (current) use of insulin: Secondary | ICD-10-CM | POA: Diagnosis not present

## 2020-04-23 DIAGNOSIS — G8929 Other chronic pain: Secondary | ICD-10-CM

## 2020-04-23 DIAGNOSIS — Z79891 Long term (current) use of opiate analgesic: Secondary | ICD-10-CM | POA: Diagnosis not present

## 2020-04-23 DIAGNOSIS — I4811 Longstanding persistent atrial fibrillation: Secondary | ICD-10-CM

## 2020-04-23 LAB — BAYER DCA HB A1C WAIVED: HB A1C (BAYER DCA - WAIVED): 10.6 % — ABNORMAL HIGH (ref <7.0)

## 2020-04-23 MED ORDER — HYDROCODONE-ACETAMINOPHEN 10-325 MG PO TABS: 1.0000 | ORAL_TABLET | Freq: Two times a day (BID) | ORAL | 0 refills | Status: DC | PRN

## 2020-04-23 MED ORDER — FREESTYLE LIBRE 14 DAY READER DEVI: 1.0000 | Freq: Four times a day (QID) | 3 refills | Status: DC

## 2020-04-23 MED ORDER — FREESTYLE LIBRE 14 DAY SENSOR MISC: 1.0000 | 3 refills | Status: DC

## 2020-04-23 NOTE — Progress Notes (Addendum)
BP 125/80   Pulse 79   Temp 97.9 F (36.6 C)   Ht 6' 4" (1.93 m)   Wt 188 lb (85.3 kg)   SpO2 97%   BMI 22.88 kg/m    Subjective:   Patient ID: [name], male    DOB: [birth year], 54 y.o.   MRN: [id number]  HPI: CRISANTO NIED is a 54 y.o. male presenting on 04/23/2020 for No chief complaint on file.   HPI Type 2 diabetes mellitus Patient comes in today for recheck of his diabetes. Patient has been currently taking Toujeo 46 units and Metformin, A1c is elevated. Patient is not currently on an ACE inhibitor/ARB. Patient has not seen an ophthalmologist this year.  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 The symptom started onset as an adult hypertension and hyperlipidemia ARE RELATED TO DM. -patient is testing blood sugar 4-6 times daily -patient is making adjustments to insulin based on blood sugar readings -patient would benefit from CGM system (I.e. Libre or Dexcom)  Hypertension Patient is currently on amiodarone metoprolol, and their blood pressure today is 125/80. Patient denies any lightheadedness or dizziness. Patient denies headaches, blurred vision, chest pains, shortness of breath, or weakness. Denies any side effects from medication and is content with current medication.    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 Pain location-bilateral lower legs from amputations and degenerative disc disease and spine Pain on scale of 1-10-4 Frequency-Daily What increases pain-certain movements make it worse like twisting or sitting up or pulling especially with his prosthetic devices on his legs What makes pain Better-hydrocodone Effects on ADL -limits ability to walk especially with the amputations and prosthetics Any change in general medical condition-none  Current opioids rx-hydrocodone-acetaminophen twice daily  as needed # meds rx-60 Effectiveness of current meds-works well when he has it Adverse reactions form pain meds-none currently Morphine equivalent-20  Pill count performed-No Last drug screen -March 2020 ( high risk q47m moderate risk q657mlow risk yearly ) Urine drug screen today- Yes [place]   If yes were their any concerning findings? -None   No flowsheet data found.   Pain contract signed on: 01/10/2019, repeated today  Relevant past medical, surgical, family and social history reviewed and updated as indicated. Interim medical history since our last visit reviewed. Allergies and medications reviewed and updated.  Review of Systems  Constitutional: Negative for chills and fever.  Respiratory: Negative for shortness of breath and wheezing.   Cardiovascular: Negative for chest pain and leg swelling.  Musculoskeletal: Positive for arthralgias, back pain and joint swelling (Around his right BKA is still a swollen).  Skin: Negative for color change, rash and wound (Appears to be healing well on his right knee).  Neurological: Negative for dizziness and weakness.  All other systems reviewed and are negative.   Per HPI unless specifically indicated above   Allergies as of 04/23/2020   No Known Allergies     Medication List       Accurate as of April 23, 2020 12:21 PM. If you have any questions, ask your nurse or doctor.        albuterol 108 (90 Base) MCG/ACT inhaler Commonly known as: VENTOLIN HFA Inhale 2 puffs into the lungs every 6 (six) hours as needed for wheezing or shortness of breath.   amiodarone  200 MG tablet Commonly known as: PACERONE TAKE 1 TABLET BY MOUTH EVERY DAY   apixaban 5 MG Tabs tablet Commonly known as: ELIQUIS Take 1 tablet (5 mg total) by mouth 2 (two) times daily.   atorvastatin 20 MG tablet Commonly known as: LIPITOR Take 1 tablet (20 mg total) by mouth daily.   diphenhydrAMINE 25 mg capsule Commonly known as:  BENADRYL Take 25 mg by mouth daily as needed for itching or sleep. To be given prior to Vancomycin administration due to rash (allergic reaction)   fluticasone 50 MCG/ACT nasal spray Commonly known as: FLONASE Place 1 spray into both nostrils 2 (two) times daily as needed for allergies or rhinitis.   HYDROcodone-acetaminophen 10-325 MG tablet Commonly known as: NORCO Take 1 tablet by mouth 2 (two) times daily as needed. What changed: Another medication with the same name was added. Make sure you understand how and when to take each. Changed by: Joshua A Dettinger, MD   HYDROcodone-acetaminophen 10-325 MG tablet Commonly known as: NORCO Take 1 tablet by mouth 2 (two) times daily as needed. Start taking on: May 23, 2020 What changed: You were already taking a medication with the same name, and this prescription was added. Make sure you understand how and when to take each. Changed by: Joshua A Dettinger, MD   HYDROcodone-acetaminophen 10-325 MG tablet Commonly known as: NORCO Take 1 tablet by mouth 2 (two) times daily as needed. Start taking on: June 22, 2020 What changed: You were already taking a medication with the same name, and this prescription was added. Make sure you understand how and when to take each. Changed by: Joshua A Dettinger, MD   metFORMIN 1000 MG tablet Commonly known as: GLUCOPHAGE Take 1 tablet (1,000 mg total) by mouth 2 (two) times daily.   metoprolol succinate 25 MG 24 hr tablet Commonly known as: Toprol XL Take 1 tablet (25 mg total) by mouth daily.   multivitamin with minerals tablet Take 1 tablet by mouth daily.   Thera-M Tabs Take by mouth.   Toujeo Max SoloStar 300 UNIT/ML Solostar Pen Generic drug: insulin glargine (2 Unit Dial) Inject 40-50 Units into the skin daily. What changed: how much to take   zinc gluconate 50 MG tablet Take 50 mg by mouth daily.        Objective:   There were no vitals taken for this visit.  Wt Readings  from Last 3 Encounters:  04/01/20 150 lb (68 kg)  03/09/20 150 lb (68 kg)  03/02/20 164 lb (74.4 kg)    Physical Exam Vitals and nursing note reviewed.  Constitutional:      General: He is not in acute distress.    Appearance: He is well-developed. He is not diaphoretic.  Eyes:     General: No scleral icterus.    Conjunctiva/sclera: Conjunctivae normal.  Neck:     Thyroid: No thyromegaly.  Cardiovascular:     Rate and Rhythm: Normal rate and regular rhythm.     Heart sounds: Normal heart sounds. No murmur heard.   Pulmonary:     Effort: Pulmonary effort is normal. No respiratory distress.     Breath sounds: Normal breath sounds. No wheezing.  Musculoskeletal:     Cervical back: Neck supple.  Lymphadenopathy:     Cervical: No cervical adenopathy.  Skin:    General: Skin is warm and dry.     Findings: No erythema (No erythema or open wound on his right BKA, appears to be healing well).    Neurological:     Mental Status: He is alert and oriented to person, place, and time.     Coordination: Coordination normal.  Psychiatric:        Behavior: Behavior normal.       Assessment & Plan:   Problem List Items Addressed This Visit      Cardiovascular and Mediastinum   Essential hypertension, benign   A-fib (HCC)     Endocrine   Neuropathy in diabetes ([place])   Relevant Orders   CBC with Differential/Platelet   Type 2 diabetes mellitus ([place]) - Primary   Relevant Orders   Microalbumin / creatinine urine ratio   Bayer DCA Hb A1c Waived   CMP14+EGFR   Uncontrolled type 2 diabetes mellitus with hyperglycemia, with long-term current use of insulin ([place])   Relevant Orders   CBC with Differential/Platelet     Other   Pain management contract signed   Relevant Medications   HYDROcodone-acetaminophen (NORCO) 10-325 MG tablet   S/P BKA (below knee amputation) unilateral, left (HCC)   Relevant Medications   HYDROcodone-acetaminophen (NORCO) 10-325 MG tablet   S/P BKA (below  knee amputation) unilateral, right ([place])    Other Visit Diagnoses    S/P bilateral below knee amputation ([place])       Relevant Orders   ToxASSURE Select 13 (MW), Urine   Chronic foot pain, unspecified laterality       Relevant Medications   HYDROcodone-acetaminophen (NORCO) 10-325 MG tablet   HYDROcodone-acetaminophen (NORCO) 10-325 MG tablet (Start on 05/23/2020)   HYDROcodone-acetaminophen (NORCO) 10-325 MG tablet (Start on 06/22/2020)      Continue hydrocodone, will check A1c and see if we need to make adjustments to his insulin, also set up with an appointment for Almyra Free  This office visit was a visit to discuss patient's diabetic management and because she is out of control and using insulin 4 times daily and having to check her blood sugars 4 times daily I believe she would be a good candidate for a continuous subcutaneous glucose monitor such as freestyle libre.  Follow up plan: Return in about 3 months (around 07/24/2020), or if symptoms worsen or fail to improve, for Diabetes recheck, also set up with Almyra Free for an appointment.  Counseling provided for all of the vaccine components Orders Placed This Encounter  Procedures  . ToxASSURE Select 13 (MW), Urine  . Microalbumin / creatinine urine ratio  . Bayer DCA Hb A1c Waived  . CMP14+EGFR  . CBC with Differential/Platelet    Caryl Pina, MD [facility] 04/23/2020, 12:21 PM

## 2020-04-24 LAB — CBC WITH DIFFERENTIAL/PLATELET
Basophils Absolute: 0 10*3/uL (ref 0.0–0.2)
Basos: 1 %
EOS (ABSOLUTE): 0.1 10*3/uL (ref 0.0–0.4)
Eos: 1 %
Hematocrit: 42.8 % (ref 37.5–51.0)
Hemoglobin: 14.5 g/dL (ref 13.0–17.7)
Immature Grans (Abs): 0 10*3/uL (ref 0.0–0.1)
Immature Granulocytes: 1 %
Lymphocytes Absolute: 1.5 10*3/uL (ref 0.7–3.1)
Lymphs: 32 %
MCH: 28 pg (ref 26.6–33.0)
MCHC: 33.9 g/dL (ref 31.5–35.7)
MCV: 83 fL (ref 79–97)
Monocytes Absolute: 0.4 10*3/uL (ref 0.1–0.9)
Monocytes: 8 %
Neutrophils Absolute: 2.7 10*3/uL (ref 1.4–7.0)
Neutrophils: 57 %
Platelets: 225 10*3/uL (ref 150–450)
RBC: 5.18 x10E6/uL (ref 4.14–5.80)
RDW: 14.3 % (ref 11.6–15.4)
WBC: 4.8 10*3/uL (ref 3.4–10.8)

## 2020-04-24 LAB — CMP14+EGFR
ALT: 27 IU/L (ref 0–44)
AST: 17 IU/L (ref 0–40)
Albumin/Globulin Ratio: 1.5 (ref 1.2–2.2)
Albumin: 4.1 g/dL (ref 3.8–4.9)
Alkaline Phosphatase: 171 IU/L — ABNORMAL HIGH (ref 48–121)
BUN/Creatinine Ratio: 17 (ref 9–20)
BUN: 15 mg/dL (ref 6–24)
Bilirubin Total: 0.4 mg/dL (ref 0.0–1.2)
CO2: 25 mmol/L (ref 20–29)
Calcium: 9.4 mg/dL (ref 8.7–10.2)
Chloride: 101 mmol/L (ref 96–106)
Creatinine, Ser: 0.88 mg/dL (ref 0.76–1.27)
GFR calc Af Amer: 112 mL/min/{1.73_m2} (ref >59)
GFR calc non Af Amer: 97 mL/min/{1.73_m2} (ref >59)
Globulin, Total: 2.7 g/dL (ref 1.5–4.5)
Glucose: 254 mg/dL — ABNORMAL HIGH (ref 65–99)
Potassium: 4.7 mmol/L (ref 3.5–5.2)
Sodium: 140 mmol/L (ref 134–144)
Total Protein: 6.8 g/dL (ref 6.0–8.5)

## 2020-04-24 LAB — MICROALBUMIN / CREATININE URINE RATIO
Creatinine, Urine: 93 mg/dL
Microalb/Creat Ratio: 30 mg/g creat — ABNORMAL HIGH (ref 0–29)
Microalbumin, Urine: 28.1 ug/mL

## 2020-04-26 ENCOUNTER — Telehealth: Payer: Self-pay | Admitting: *Deleted

## 2020-04-26 DIAGNOSIS — E1165 Type 2 diabetes mellitus with hyperglycemia: Secondary | ICD-10-CM

## 2020-04-26 MED ORDER — DEXCOM G5 RECEIVER KIT DEVI: 1.0000 | Freq: Four times a day (QID) | 1 refills | Status: DC

## 2020-04-26 NOTE — Telephone Encounter (Signed)
PA received for [employer] system but [employer] will cover [employer]

## 2020-04-26 NOTE — Telephone Encounter (Signed)
Dexcom Prescription sent to pharmacy

## 2020-04-27 ENCOUNTER — Other Ambulatory Visit: Payer: Self-pay

## 2020-04-27 ENCOUNTER — Ambulatory Visit (INDEPENDENT_AMBULATORY_CARE_PROVIDER_SITE_OTHER): Payer: Medicare Other | Admitting: Pharmacist

## 2020-04-27 DIAGNOSIS — E1165 Type 2 diabetes mellitus with hyperglycemia: Secondary | ICD-10-CM

## 2020-04-27 DIAGNOSIS — Z794 Long term (current) use of insulin: Secondary | ICD-10-CM

## 2020-04-27 NOTE — Progress Notes (Signed)
    04/27/2020 Name: [name] MRN: [id number] DOB: August 16, 1966   S:  58 yoM presents for diabetes evaluation, education, and management. Patient was referred and last seen by Primary Care Provider on 04/23/21.  Insurance coverage/medication affordability: [employer] medicare  Patient reports adherence with medications. . Current diabetes medications include: toujeo,metformin . Current hypertension medications include: metoprolol Goal 130/80 . Current hyperlipidemia medications include: atorvastatin   Patient denies hypoglycemic events.   Patient reported dietary habits: Eats 2-3 meals/day Discussed meal planning options and Plate method for healthy eating . Avoid sugary drinks and desserts . Incorporate balanced protein, non starchy veggies, 1 serving of carbohydrate with each meal . Increase water intake . Increase physical activity as able  Patient-reported exercise habits: n/a  O:  Lab Results  Component Value Date   HGBA1C 10.6 (H) 04/23/2020   Lipid Panel     Component Value Date/Time   CHOL 126 07/22/2019 1310   TRIG 189 (H) 07/22/2019 1310   HDL 31 (L) 07/22/2019 1310   CHOLHDL 4.1 07/22/2019 1310   CHOLHDL 4.6 01/30/2018 1347   VLDL 54 (H) 01/30/2018 1347   LDLCALC 63 07/22/2019 1310    Home fasting blood sugars: n/a  2 hour post-meal/random blood sugars: n/a.   A/P:  Diabetes T2DM currently uncontrolled. Patient is able to verbalize appropriate hypoglycemia management plan. Patient is adherent with medication. Control is suboptimal due to diet/medications not optimized  -Patient seeking libre 2 CGM device, however he is not injecting 3 or more times per day.  This will not be covered under medicare.  Can attempt to send to [facility]   --Started GLP-1 Ozempic (generic name: semglutide)  . Instructions: Inject 0.25 mg into the skin weekly for 4 weeks, then increase to 0.5mg  weekly thereafter (as tolerated) . Sample IWL#[id number], Exp 10/23 . Denies  history of thyroid cancer  -Continued basal insulin Toujeo 40-60 units daily  -Continued metformin  -Would benefit from ACEi/ARB, will address with PCP at patient's next visit.  He was previously on lisinopril per chart notes: "BP was soft last month, therefore his PCP discontinued Lisinopril. He was discharged on Cardizem CD but refills were not provided, therefore he discontinued this several weeks ago. BP is at 108/69 during today's visit."  -Extensively discussed pathophysiology of diabetes, recommended lifestyle interventions, dietary effects on blood sugar control  -Counseled on s/sx of and management of hypoglycemia  -Next A1C anticipated 07/2020.     Written patient instructions provided.  Total time in face to face counseling 30 minutes.   Follow up PCP Clinic Visit ON 07/30/20.    Kieth Brightly, PharmD, BCPS Clinical Pharmacist, [facility]  II Phone [phone]

## 2020-04-28 LAB — TOXASSURE SELECT 13 (MW), URINE

## 2020-04-29 ENCOUNTER — Other Ambulatory Visit: Payer: Self-pay | Admitting: Family Medicine

## 2020-04-29 MED ORDER — TIZANIDINE HCL 4 MG PO TABS: 2.0000 mg | ORAL_TABLET | Freq: Three times a day (TID) | ORAL | 0 refills | Status: DC | PRN

## 2020-04-29 NOTE — Telephone Encounter (Signed)
Can we send in muscle relaxer or would pt ntbs?

## 2020-04-29 NOTE — Telephone Encounter (Signed)
Please send in Zanaflex 4 mg tablets #30 with no refills.  Take 1/2-1 tablet by mouth every 8 hours as needed for muscle spasm.   Needs office visit with PCP if ongoing symptoms. Caution sedation. NO driving with med.

## 2020-04-29 NOTE — Telephone Encounter (Signed)
°  Prescription Request  04/29/2020  What is the name of the medication or equipment? Pt saw Dr Dettinger last week for back pain. He has tried the heating pad / pain meds. He wants to know if the doctor will call in muscle relaxer  Have you contacted your pharmacy to request a refill? (if applicable) no  Which pharmacy would you like this sent to? [facility]   Patient notified that their request is being sent to the clinical staff for review and that they should receive a response within 2 business days.

## 2020-04-29 NOTE — Telephone Encounter (Signed)
Rx sent over per Dr Reece Agar and pt advised of provider feedback and voiced understanding. Pt aware not to drive with the medication and that he should start out taking 1/2 tablet to see if it works prior to taking 1 whole tablet.

## 2020-05-03 ENCOUNTER — Telehealth: Payer: Self-pay | Admitting: Family Medicine

## 2020-05-03 ENCOUNTER — Other Ambulatory Visit: Payer: Self-pay | Admitting: Family Medicine

## 2020-05-03 NOTE — Telephone Encounter (Signed)
Have him make an appointment so we can evaluate him.

## 2020-05-04 NOTE — Telephone Encounter (Signed)
Spoke with patient, he has already called our office and scheduled an appointment.

## 2020-05-05 DIAGNOSIS — M62830 Muscle spasm of back: Secondary | ICD-10-CM | POA: Diagnosis not present

## 2020-05-05 DIAGNOSIS — M9904 Segmental and somatic dysfunction of sacral region: Secondary | ICD-10-CM | POA: Diagnosis not present

## 2020-05-05 DIAGNOSIS — M9903 Segmental and somatic dysfunction of lumbar region: Secondary | ICD-10-CM | POA: Diagnosis not present

## 2020-05-05 DIAGNOSIS — M9905 Segmental and somatic dysfunction of pelvic region: Secondary | ICD-10-CM | POA: Diagnosis not present

## 2020-05-11 DIAGNOSIS — M9903 Segmental and somatic dysfunction of lumbar region: Secondary | ICD-10-CM | POA: Diagnosis not present

## 2020-05-11 DIAGNOSIS — M9904 Segmental and somatic dysfunction of sacral region: Secondary | ICD-10-CM | POA: Diagnosis not present

## 2020-05-11 DIAGNOSIS — M9905 Segmental and somatic dysfunction of pelvic region: Secondary | ICD-10-CM | POA: Diagnosis not present

## 2020-05-11 DIAGNOSIS — M62830 Muscle spasm of back: Secondary | ICD-10-CM | POA: Diagnosis not present

## 2020-05-12 ENCOUNTER — Other Ambulatory Visit: Payer: Self-pay | Admitting: Family Medicine

## 2020-05-12 DIAGNOSIS — E1142 Type 2 diabetes mellitus with diabetic polyneuropathy: Secondary | ICD-10-CM

## 2020-05-13 ENCOUNTER — Telehealth: Payer: Self-pay | Admitting: Family Medicine

## 2020-05-13 MED ORDER — DULOXETINE HCL 60 MG PO CPEP: 60.0000 mg | ORAL_CAPSULE | Freq: Every day | ORAL | 1 refills | Status: DC

## 2020-05-13 NOTE — Telephone Encounter (Signed)
I don't see this on his current med list and tried to look in the history but I didn't see Duloxetine unless I missed it.   Ok to refill?

## 2020-05-13 NOTE — Telephone Encounter (Signed)
I sent duloxetine for the patient

## 2020-05-13 NOTE — Telephone Encounter (Signed)
Patient aware, script is ready. 

## 2020-05-13 NOTE — Telephone Encounter (Signed)
  Prescription Request  05/13/2020  What is the name of the medication or equipment? Duloxetine  Have you contacted your pharmacy to request a refill? (if applicable) Yes  Which pharmacy would you like this sent to? [facility]   Patient notified that their request is being sent to the clinical staff for review and that they should receive a response within 2 business days.   Dettinger's pt.  Please call pt.

## 2020-05-14 ENCOUNTER — Encounter: Payer: Self-pay | Admitting: Pharmacist

## 2020-05-14 ENCOUNTER — Ambulatory Visit: Payer: Medicare Other | Admitting: Family Medicine

## 2020-05-17 ENCOUNTER — Telehealth: Payer: Self-pay | Admitting: Family Medicine

## 2020-05-17 NOTE — Telephone Encounter (Signed)
  Prescription Request  05/17/2020  What is the name of the medication or equipment? Gabapentin RX  Have you contacted your pharmacy to request a refill? (if applicable) Yes  Which pharmacy would you like this sent to? [facility]   Patient notified that their request is being sent to the clinical staff for review and that they should receive a response within 2 business days.   Dettinger's pt.  He took more than he was suppose to bc he was hurting.

## 2020-05-17 NOTE — Telephone Encounter (Addendum)
Patient called wanting to obtain freestyle libre Toujeo taking up to 50 units If BG is still high around lunch he takes an additional 40 units If BG is still high around dinner he takes an additional 20 units  Doing okay on Ozempic 0.25mg  sq weekly  Increase to 0.5mg  sq weekly in 2 weeks  Will send RX for Josephine Igo to [facility] to seek approval

## 2020-05-17 NOTE — Telephone Encounter (Signed)
It looks like the cardiologist discontinued the gabapentin at his OV 03/29/20. Please advise

## 2020-05-19 NOTE — Telephone Encounter (Signed)
Per the cardiologist's note the gabapentin was discontinued because he took too many of them and they were concerned, that is all I can read on it and that is something we can discuss, please schedule a visit ASAP so we can discuss this.

## 2020-05-19 NOTE — Telephone Encounter (Signed)
He wants to know why Dr. Juliette Alcide it

## 2020-05-19 NOTE — Telephone Encounter (Signed)
Patient aware and verbalized understanding. °

## 2020-05-19 NOTE — Telephone Encounter (Signed)
Patient took it at night because it made him sleep from where is foot was taken off. He took that instead of his gabapentin. Patient states he ran out 2-3 weeks ago.

## 2020-05-19 NOTE — Telephone Encounter (Signed)
If he took too many of them then I do not know if we can keep them going, it is not a medication you can take too much of because of the issues.  Please have him schedule a visit and we can discuss options.

## 2020-05-21 ENCOUNTER — Encounter: Payer: Self-pay | Admitting: Family Medicine

## 2020-05-21 ENCOUNTER — Ambulatory Visit (INDEPENDENT_AMBULATORY_CARE_PROVIDER_SITE_OTHER): Payer: Medicare Other | Admitting: Family Medicine

## 2020-05-21 DIAGNOSIS — E1142 Type 2 diabetes mellitus with diabetic polyneuropathy: Secondary | ICD-10-CM | POA: Diagnosis not present

## 2020-05-21 MED ORDER — GABAPENTIN 600 MG PO TABS: 600.0000 mg | ORAL_TABLET | Freq: Three times a day (TID) | ORAL | 3 refills | Status: DC

## 2020-05-21 NOTE — Progress Notes (Signed)
Virtual Visit via telephone Note  I connected with [name] on 05/21/20 at 0900 by telephone and verified that I am speaking with the correct person using two identifiers. HERSCHEL FLEAGLE is currently located at home and no other people are currently with her during visit. The provider, Elige Radon Kinsley Holderman, MD is located in their office at time of visit.  Call ended at 0910  I discussed the limitations, risks, security and privacy concerns of performing an evaluation and management service by telephone and the availability of in person appointments. I also discussed with the patient that there may be a patient responsible charge related to this service. The patient expressed understanding and agreed to proceed.   History and Present Illness: Patient is calling in for pain and neuropathy and wants to restart gabapentin.  Patient was discontinued from medicine because of concerns about overdoing it from cardiology, we discussed the importance of not overdoing it only during 1/day and he is agreeable towards this and not interacting with the hydrocodone.  It does help him substantially with his neuropathy and helps him sleep at night so that is why he mostly takes it at night, he did go through time period where he was taken to right after the surgery, he will go back to just taking the 1 and not over doing it.  1. Diabetic polyneuropathy associated with type 2 diabetes mellitus [facility])     Outpatient Encounter Medications as of 05/21/2020  Medication Sig  . albuterol (PROVENTIL HFA;VENTOLIN HFA) 108 (90 Base) MCG/ACT inhaler Inhale 2 puffs into the lungs every 6 (six) hours as needed for wheezing or shortness of breath.  Marland Kitchen amiodarone (PACERONE) 200 MG tablet TAKE 1 TABLET BY MOUTH EVERY DAY  . apixaban (ELIQUIS) 5 MG TABS tablet Take 1 tablet (5 mg total) by mouth 2 (two) times daily.  Marland Kitchen atorvastatin (LIPITOR) 20 MG tablet TAKE 1 TABLET BY MOUTH EVERY DAY  . Continuous Blood Gluc Sensor  (FREESTYLE LIBRE 14 DAY SENSOR) MISC 1 each by Does not apply route every 14 (fourteen) days.  . diphenhydrAMINE (BENADRYL) 25 mg capsule Take 25 mg by mouth daily as needed for itching or sleep. To be given prior to Vancomycin administration due to rash (allergic reaction)   . DULoxetine (CYMBALTA) 60 MG capsule Take 1 capsule (60 mg total) by mouth daily.  . fluticasone (FLONASE) 50 MCG/ACT nasal spray Place 1 spray into both nostrils 2 (two) times daily as needed for allergies or rhinitis.  Marland Kitchen gabapentin (NEURONTIN) 600 MG tablet Take 1 tablet (600 mg total) by mouth 3 (three) times daily.  Marland Kitchen HYDROcodone-acetaminophen (NORCO) 10-325 MG tablet Take 1 tablet by mouth 2 (two) times daily as needed.  Melene Muller ON 05/23/2020] HYDROcodone-acetaminophen (NORCO) 10-325 MG tablet Take 1 tablet by mouth 2 (two) times daily as needed.  Melene Muller ON 06/22/2020] HYDROcodone-acetaminophen (NORCO) 10-325 MG tablet Take 1 tablet by mouth 2 (two) times daily as needed.  . Insulin Glargine, 2 Unit Dial, (TOUJEO MAX SOLOSTAR) 300 UNIT/ML SOPN Inject 40-50 Units into the skin daily. (Patient taking differently: Inject 40-66 Units into the skin daily. )  . metFORMIN (GLUCOPHAGE) 1000 MG tablet Take 1 tablet (1,000 mg total) by mouth 2 (two) times daily.  . metoprolol succinate (TOPROL XL) 25 MG 24 hr tablet Take 1 tablet (25 mg total) by mouth daily.  . Multiple Vitamins-Minerals (MULTIVITAMIN WITH MINERALS) tablet Take 1 tablet by mouth daily.  . Multiple Vitamins-Minerals (THERA-M) TABS Take by  mouth.  . Semaglutide,0.25 or 0.5MG /DOS, (OZEMPIC, 0.25 OR 0.5 MG/DOSE,) 2 MG/1.5ML SOPN Inject 0.5 mg into the skin once a week.  Marland Kitchen tiZANidine (ZANAFLEX) 4 MG tablet Take 0.5-1 tablets (2-4 mg total) by mouth every 8 (eight) hours as needed for muscle spasms.  Marland Kitchen zinc gluconate 50 MG tablet Take 50 mg by mouth daily.   No facility-administered encounter medications on file as of 05/21/2020.    Review of Systems    Constitutional: Negative for chills and fever.  Eyes: Negative for visual disturbance.  Respiratory: Negative for shortness of breath and wheezing.   Cardiovascular: Negative for chest pain and leg swelling.  Musculoskeletal: Negative for back pain and gait problem.  Skin: Negative for rash.  Neurological: Positive for numbness. Negative for dizziness, weakness and light-headedness.  All other systems reviewed and are negative.   Observations/Objective: Patient sounds comfortable and in no acute distress  Assessment and Plan: Problem List Items Addressed This Visit      Endocrine   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   Relevant Medications   gabapentin (NEURONTIN) 600 MG tablet      Will restart gabapentin but extensively discussed it with him of not overdoing it and the interaction. Follow up plan: Return in about 2 months (around 07/22/2020), or if symptoms worsen or fail to improve, for Diabetes recheck.     I discussed the assessment and treatment plan with the patient. The patient was provided an opportunity to ask questions and all were answered. The patient agreed with the plan and demonstrated an understanding of the instructions.   The patient was advised to call back or seek an in-person evaluation if the symptoms worsen or if the condition fails to improve as anticipated.  The above assessment and management plan was discussed with the patient. The patient verbalized understanding of and has agreed to the management plan. Patient is aware to call the clinic if symptoms persist or worsen. Patient is aware when to return to the clinic for a follow-up visit. Patient educated on when it is appropriate to go to the emergency department.    I provided 10 minutes of non-face-to-face time during this encounter.    Nils Pyle, MD

## 2020-05-24 DIAGNOSIS — Z794 Long term (current) use of insulin: Secondary | ICD-10-CM | POA: Diagnosis not present

## 2020-05-24 DIAGNOSIS — E1165 Type 2 diabetes mellitus with hyperglycemia: Secondary | ICD-10-CM | POA: Diagnosis not present

## 2020-05-24 MED ORDER — FREESTYLE LIBRE 14 DAY SENSOR MISC: 3 refills | Status: DC

## 2020-05-24 MED ORDER — FREESTYLE LIBRE 2 READER DEVI: 0 refills | Status: DC

## 2020-05-31 ENCOUNTER — Other Ambulatory Visit: Payer: Self-pay

## 2020-05-31 ENCOUNTER — Ambulatory Visit (INDEPENDENT_AMBULATORY_CARE_PROVIDER_SITE_OTHER): Payer: Medicare Other | Admitting: Pharmacist

## 2020-05-31 ENCOUNTER — Encounter: Payer: Self-pay | Admitting: Pharmacist

## 2020-05-31 VITALS — BP 135/85 | HR 71

## 2020-05-31 DIAGNOSIS — E1165 Type 2 diabetes mellitus with hyperglycemia: Secondary | ICD-10-CM

## 2020-05-31 DIAGNOSIS — Z794 Long term (current) use of insulin: Secondary | ICD-10-CM

## 2020-05-31 MED ORDER — OZEMPIC (0.25 OR 0.5 MG/DOSE) 2 MG/1.5ML ~~LOC~~ SOPN: 0.5000 mg | PEN_INJECTOR | SUBCUTANEOUS | 11 refills | Status: DC

## 2020-05-31 NOTE — Progress Notes (Signed)
    05/31/2020 Name: [name] MRN: [id number] DOB: [birth year]   S:  42 yoM presents for diabetes evaluation, education, and management.   Patient was referred and last seen by Primary Care Provider on 04/23/20.  Insurance coverage/medication affordability: [employer] medicare  Patient reports adherence with medications.  Current diabetes medications include: toujeo,metformin, ozempic  Current hypertension medications include: metoprolol Goal 130/80  Current hyperlipidemia medications include: atorvastatin   Patient denies hypoglycemic events.   Patient reported dietary habits: Eats 2 meals/day Skips breakfast Eats [place] for lunch/diner often--> burrito, arroz con pollo (works to avoid large portion of rice, carbs, etc)  Drinks: Diet drinks, water, gatorade  Patient-reported exercise habits: n/a  O:  Lab Results  Component Value Date   HGBA1C 10.6 (H) 04/23/2020   Lipid Panel     Component Value Date/Time   CHOL 126 07/22/2019 1310   TRIG 189 (H) 07/22/2019 1310   HDL 31 (L) 07/22/2019 1310   CHOLHDL 4.1 07/22/2019 1310   CHOLHDL 4.6 01/30/2018 1347   VLDL 54 (H) 01/30/2018 1347   LDLCALC 63 07/22/2019 1310    Home fasting blood sugars: 205 today in office  2 hour post-meal/random blood sugars: n/a.    A/P:  Diabetes T2DM currently uncontrolled. Patient is able to verbalize appropriate hypoglycemia management plan. Patient is adherent with medication. Control is suboptimal due to diet/medications not optimized Patient reports he has made great improvement to his diet since our last visit.   -Continue GLP-1 Ozempic (generic name: semglutide)   Increase to 0.5mg  weekly (as tolerated)  Takes on Tuesdays  Sample KPT#[id number], exp10/23  Denies history of thyroid cancer  -Continued basal insulin Toujeo 40-60 units daily  -Continued metformin  -Would benefit from ACEi/ARB, will address with PCP at patient's next visit.  He was previously on lisinopril  per chart notes: "BP was soft last month, therefore his PCP discontinued Lisinopril. He was discharged on Cardizem CD but refills were not provided, therefore he discontinued this several weeks ago. BP is at 108/69 during today's visit."  -Extensively discussed pathophysiology of diabetes, recommended lifestyle interventions, dietary effects on blood sugar control  -Patient approved for CGM Libre 2 --> he has system at home and is encouraged to bring it back in.  He has not started using yet. Supplied via  [employer]   -Extensively discussed pathophysiology of diabetes, recommended lifestyle interventions, dietary effects on blood sugar control  -Counseled on s/sx of and management of hypoglycemia  -Next A1C anticipated 07/30/20.  Written patient instructions provided.  Total time in face to face counseling 30 minutes.   Follow up PCP Clinic Visit ON 07/07/20, 07/30/20.    Kieth Brightly, PharmD, BCPS Clinical Pharmacist, [facility]  II Phone [phone]

## 2020-06-16 ENCOUNTER — Other Ambulatory Visit: Payer: Self-pay

## 2020-06-16 ENCOUNTER — Encounter: Payer: Self-pay | Admitting: Family Medicine

## 2020-06-16 ENCOUNTER — Ambulatory Visit (INDEPENDENT_AMBULATORY_CARE_PROVIDER_SITE_OTHER): Payer: Medicare Other | Admitting: Family Medicine

## 2020-06-16 VITALS — BP 135/86 | HR 81 | Temp 98.0°F | Ht 76.0 in | Wt 201.2 lb

## 2020-06-16 DIAGNOSIS — L0390 Cellulitis, unspecified: Secondary | ICD-10-CM

## 2020-06-16 DIAGNOSIS — L02811 Cutaneous abscess of head [any part, except face]: Secondary | ICD-10-CM

## 2020-06-16 DIAGNOSIS — B958 Unspecified staphylococcus as the cause of diseases classified elsewhere: Secondary | ICD-10-CM

## 2020-06-16 MED ORDER — MINOCYCLINE HCL 100 MG PO CAPS: 100.0000 mg | ORAL_CAPSULE | Freq: Two times a day (BID) | ORAL | 0 refills | Status: DC

## 2020-06-16 NOTE — Progress Notes (Signed)
Cardiology Office Note    Date:  06/21/2020   ID:  Anthony RavensKeith B Przybysz, DOB [birth year], MRN [id number]  PCP:  Dettinger, Elige RadonJoshua A, MD  Cardiologist: Nona DellSamuel McDowell, MD EPS: None  Chief Complaint  Patient presents with  . Follow-up    History of Present Illness:  [name] is a 54 y.o. male with a history of Paroxysmal atrial fibrillation 10/2019 in the setting of sepsis and osteomyelitis.  He had not had any recurrence.  CHA2DS2-VASc equals 5 on Eliquis.  Dr. Diona BrownerMcDowell stopped his amiodarone 03/29/2020 to reduced chances of long-term side effects.  He started Toprol-XL 25 mg daily.  Also has Essential hypertension,Hyperlipidemia, DM 2.  Patient comes in for f/u. He denies palpitations, dyspnea dizziness or presyncope. No bleeding problems on eliquis. Labs stable in June.      Past Medical History:  Diagnosis Date  . Charcot ankle   . Essential hypertension   . Osteomyelitis of toe of right foot (HCC)   . Paroxysmal atrial fibrillation (HCC)   . Recurrent boils   . Type 2 diabetes mellitus with diabetic neuropathy [facility](HCC)    Diagnosed age 54    Past Surgical History:  Procedure Laterality Date  . AMPUTATION Right 06/17/2014   Procedure: PARTIAL AMPUTATION RIGHT 3RD TOE;  Surgeon: Dallas SchimkeBenjamin Ivan McKinney, DPM;  Location: [facility];  Service: Podiatry;  Laterality: Right;  . AMPUTATION Right 09/02/2014   Procedure: PARTIAL AMPUTATION 2ND TOE RIGHT FOOT;  Surgeon: Dallas SchimkeBenjamin Ivan McKinney, DPM;  Location: [facility];  Service: Podiatry;  Laterality: Right;  . AMPUTATION Right 04/01/2015   Procedure: AMPUTATION DIGIT 1ST TOE RIGHT FOOT;  Surgeon: Laurell JosephsSherry Lu, DPM;  Location: [facility];  Service: Podiatry;  Laterality: Right;  . AMPUTATION Right 06/29/2016   pinkeye   . AMPUTATION Right 10/24/2019   Procedure: PARTIAL THIRD RAY AMPUTATION;  Surgeon: Ferman HammingMcKinney, Benjamin, DPM;  Location: [facility];  Service: Podiatry;  Laterality: Right;  . AMPUTATION Right 02/19/2020   Procedure: AMPUTATION BELOW  KNEE; RIGHT;  Surgeon: Franky MachoJenkins, Mark, MD;  Location: [facility];  Service: General;  Laterality: Right;  . FOOT AMPUTATION Left   . FOOT SURGERY Left 03/2017  . HERNIA REPAIR Bilateral    and 3rd hernia repair does not remember ehat side  . IRRIGATION AND DEBRIDEMENT ABSCESS Right 11/05/2019   Procedure: IRRIGATION AND DRAINAGE ABSCESS RIGHT ANKLE;  Surgeon: Ferman HammingMcKinney, Benjamin, DPM;  Location: [facility];  Service: Podiatry;  Laterality: Right;  . TEE WITHOUT CARDIOVERSION N/A 10/29/2019   Procedure: TRANSESOPHAGEAL ECHOCARDIOGRAM (TEE) WITH PROPOFOL;  Surgeon: Pricilla Riffleoss, Paula V, MD;  Location[facility];  Service: Cardiovascular;  Laterality: N/A;    Current Medications: Current Meds  Medication Sig  . albuterol (PROVENTIL HFA;VENTOLIN HFA) 108 (90 Base) MCG/ACT inhaler Inhale 2 puffs into the lungs every 6 (six) hours as needed for wheezing or shortness of breath.  Marland Kitchen. apixaban (ELIQUIS) 5 MG TABS tablet Take 1 tablet (5 mg total) by mouth 2 (two) times daily.  Marland Kitchen. atorvastatin (LIPITOR) 20 MG tablet TAKE 1 TABLET BY MOUTH EVERY DAY  . Continuous Blood Gluc Receiver (FREESTYLE LIBRE 2 READER) DEVI Use as directed to test blood sugar up to 6 times daily as directed. DX: E 11.9  . Continuous Blood Gluc Sensor (FREESTYLE LIBRE 14 DAY SENSOR) MISC Use as directed to test blood sugar up to 6 times daily as directed. DX: E 11.9  . DULoxetine (CYMBALTA) 60 MG capsule Take 1 capsule (60 mg total) by mouth daily.  .Marland Kitchen  fluticasone (FLONASE) 50 MCG/ACT nasal spray Place 1 spray into both nostrils 2 (two) times daily as needed for allergies or rhinitis.  Marland Kitchen gabapentin (NEURONTIN) 600 MG tablet Take 1 tablet (600 mg total) by mouth 3 (three) times daily.  Marland Kitchen HYDROcodone-acetaminophen (NORCO) 10-325 MG tablet Take 1 tablet by mouth 2 (two) times daily as needed.  . Insulin Glargine, 2 Unit Dial, (TOUJEO MAX SOLOSTAR) 300 UNIT/ML SOPN Inject 40-50 Units into the skin daily. (Patient taking differently: Inject 40-66 Units  into the skin daily. )  . metFORMIN (GLUCOPHAGE) 1000 MG tablet Take 1 tablet (1,000 mg total) by mouth 2 (two) times daily.  . metoprolol succinate (TOPROL XL) 25 MG 24 hr tablet Take 1 tablet (25 mg total) by mouth daily.  . minocycline (MINOCIN) 100 MG capsule Take 1 capsule (100 mg total) by mouth 2 (two) times daily. Take on an empty stomach  . Multiple Vitamins-Minerals (MULTIVITAMIN WITH MINERALS) tablet Take 1 tablet by mouth daily.  . Multiple Vitamins-Minerals (THERA-M) TABS Take by mouth.  . Semaglutide,0.25 or 0.5MG /DOS, (OZEMPIC, 0.25 OR 0.5 MG/DOSE,) 2 MG/1.5ML SOPN Inject 0.375 mLs (0.5 mg total) into the skin once a week.     Allergies:   Patient has no known allergies.   Social History   Socioeconomic History  . Marital status: Single    Spouse name: Not on file  . Number of children: Not on file  . Years of education: Not on file  . Highest education level: Not on file  Occupational History  . Occupation: Receiving  Tobacco Use  . Smoking status: Never Smoker  . Smokeless tobacco: Never Used  Vaping Use  . Vaping Use: Never used  Substance and Sexual Activity  . Alcohol use: No  . Drug use: No  . Sexual activity: Not on file  Other Topics Concern  . Not on file  Social History Narrative  . Not on file   Social Determinants of Health   Financial Resource Strain:   . Difficulty of Paying Living Expenses:   Food Insecurity:   . Worried About Programme researcher, broadcasting/film/video in the Last Year:   . Barista in the Last Year:   Transportation Needs:   . Freight forwarder (Medical):   Marland Kitchen Lack of Transportation (Non-Medical):   Physical Activity:   . Days of Exercise per Week:   . Minutes of Exercise per Session:   Stress:   . Feeling of Stress :   Social Connections:   . Frequency of Communication with Friends and Family:   . Frequency of Social Gatherings with Friends and Family:   . Attends Religious Services:   . Active Member of Clubs or  Organizations:   . Attends Banker Meetings:   Marland Kitchen Marital Status:      Family History:  The patient's   family history includes Aneurysm in his father; Asthma in his father; COPD in his father; Diabetes in his brother and mother; Stroke in his brother and father.   ROS:   Please see the history of present illness.    ROS All other systems reviewed and are negative.   PHYSICAL EXAM:   VS:  BP 120/72   Pulse 80   Ht 6\' 2"  (1.88 m)   Wt 198 lb 6.4 oz (90 kg)   SpO2 99%   BMI 25.47 kg/m   Physical Exam  GEN: Well nourished, well developed, in no acute distress  Neck: no JVD, carotid bruits,  or masses Cardiac:RRR; no murmurs, rubs, or gallops  Respiratory:  clear to auscultation bilaterally, normal work of breathing GI: soft, nontender, nondistended, + BS Ext: RBKA otherwisewithout cyanosis, clubbing, or edema,  Neuro:  Alert and Oriented x 3 Psych: euthymic mood, full affect  Wt Readings from Last 3 Encounters:  06/21/20 198 lb 6.4 oz (90 kg)  06/16/20 201 lb 3.2 oz (91.3 kg)  04/23/20 188 lb (85.3 kg)      Studies/Labs Reviewed:   EKG:  EKG is not ordered today.  Recent Labs: 10/23/2019: TSH 0.929 02/23/2020: Magnesium 1.8 04/23/2020: ALT 27; BUN 15; Creatinine, Ser 0.88; Hemoglobin 14.5; Platelets 225; Potassium 4.7; Sodium 140   Lipid Panel    Component Value Date/Time   CHOL 126 07/22/2019 1310   TRIG 189 (H) 07/22/2019 1310   HDL 31 (L) 07/22/2019 1310   CHOLHDL 4.1 07/22/2019 1310   CHOLHDL 4.6 01/30/2018 1347   VLDL 54 (H) 01/30/2018 1347   LDLCALC 63 07/22/2019 1310    Additional studies/ records that were reviewed today include:    Echocardiogram: 10/2019 IMPRESSIONS     1. Left ventricular ejection fraction, by visual estimation, is 70 to  75%. The left ventricle has hyperdynamic function. There is mildly  increased left ventricular hypertrophy.   2. Left ventricular diastolic parameters are indeterminate.   3. The left ventricle has  no regional wall motion abnormalities.   4. Global right ventricle has normal systolic function.The right  ventricular size is normal. No increase in right ventricular wall  thickness.   5. Left atrial size was normal.   6. Right atrial size was normal.   7. The mitral valve is grossly normal. Trivial mitral valve  regurgitation.   8. The tricuspid valve is grossly normal. Tricuspid valve regurgitation  is trivial.   9. The aortic valve is tricuspid. Aortic valve regurgitation is not  visualized.  10. The pulmonic valve was grossly normal. Pulmonic valve regurgitation is  not visualized.  11. Mildly elevated pulmonary artery systolic pressure.  12. The tricuspid regurgitant velocity is 3.04 m/s, and with an assumed  right atrial pressure of 3 mmHg, the estimated right ventricular systolic  pressure is mildly elevated at 40.0 mmHg.  13. The inferior vena cava is normal in size with greater than 50%  respiratory variability, suggesting right atrial pressure of 3 mmHg.    TEE: 10/2019 IMPRESSIONS     1. Left ventricular ejection fraction, by visual estimation, is 60 to  65%. The left ventricle has normal function. There is no left ventricular  hypertrophy.   2. The left ventricle has no regional wall motion abnormalities.   3. Global right ventricle has normal systolic function.The right  ventricular size is normal. Right vetricular wall thickness was not  assessed.   4. Left atrial size was normal.   5. Right atrial size was normal.   6. The mitral valve is normal in structure. No evidence of mitral valve  regurgitation.   7. The tricuspid valve is normal in structure.   8. The aortic valve is normal in structure. Aortic valve regurgitation is  not visualized.   9. The pulmonic valve was normal in structure. Pulmonic valve  regurgitation is trivial.      ASSESSMENT:    1. Paroxysmal atrial fibrillation (HCC)   2. Essential hypertension   3. Hyperlipidemia, unspecified  hyperlipidemia type      PLAN:  In order of problems listed above:  Paroxysmal atrial fibrillation 10/2019 in the setting  of sepsis and osteomyelitis.  He had not had any recurrence.  CHA2DS2-VASc equals 5 on Eliquis.  Dr. Diona Browner stopped his amiodarone 03/29/2020 to reduced chances of long-term side effects.  He start Toprol-XL 25 mg daily. HR regular on exam. He thinks he stopped Amio but still on med list. He will verify. Labs stable in June. No changes. F/u in 6 months.  Essential hypertension BP controlled  Hyperlipidemia trig 189, LDL 63 07/2019    Medication Adjustments/Labs and Tests Ordered: Current medicines are reviewed at length with the patient today.  Concerns regarding medicines are outlined above.  Medication changes, Labs and Tests ordered today are listed in the Patient Instructions below. Patient Instructions  Medication Instructions:  Your physician recommends that you continue on your current medications as directed. Please refer to the Current Medication list given to you today.  *If you need a refill on your cardiac medications before your next appointment, please call your pharmacy*   Lab Work: None  If you have labs (blood work) drawn today and your tests are completely normal, you will receive your results only by: Marland Kitchen MyChart Message (if you have MyChart) OR . A paper copy in the mail If you have any lab test that is abnormal or we need to change your treatment, we will call you to review the results.   Testing/Procedures: None   Follow-Up: At [facility], you and your health needs are our priority.  As part of our continuing mission to provide you with exceptional heart care, we have created designated Provider Care Teams.  These Care Teams include your primary Cardiologist (physician) and Advanced Practice Providers (APPs -  Physician Assistants and Nurse Practitioners) who all work together to provide you with the care you need, when you need  it.  We recommend signing up for the patient portal called "MyChart".  Sign up information is provided on this After Visit Summary.  MyChart is used to connect with patients for Virtual Visits (Telemedicine).  Patients are able to view lab/test results, encounter notes, upcoming appointments, etc.  Non-urgent messages can be sent to your provider as well.   To learn more about what you can do with MyChart, go to [email or web address].    Your next appointment:   6 month(s)  The format for your next appointment:   In Person  Provider:   Nona Dell, MD   Other Instructions None     Signed, Jacolyn Reedy, PA-C  06/21/2020 2:30 PM    [facility] [address] [place], [place], [place]  [address] Phone: [phone]; Fax: [phone]

## 2020-06-16 NOTE — Progress Notes (Signed)
Chief Complaint  Patient presents with  . Cyst    Behind Right Ear    HPI  Patient presents today for lesion behind the right ear that popped up about a week ago.  Of note is that earlier this year the patient lost her foot over a lesion that popped up on his foot and Charcot joint area.  It developed a staph infection.  He subsequently ended up having a BKA.  This lesion popped up about a week ago behind the right ear.  Its been swelling and painful but it has not been draining.  They have tried to open the wound with heat and squeezing on it but have only gotten a little bit of blood out of it.  PMH: Smoking status noted ROS: Per HPI  Objective: BP 135/86   Pulse 81   Temp 98 F (36.7 C) (Temporal)   Ht 6\' 4"  (1.93 m)   Wt 201 lb 3.2 oz (91.3 kg)   BMI 24.49 kg/m  Gen: NAD, alert, cooperative with exam HEENT: NCAT, EOMI, PERRL CV: RRR, good S1/S2, no murmur Resp: CTABL, no wheezes, non-labored Skin: Behind the right ear there is a 1.5 cm erythematous raised lesion.  It is tender.  There is no head. Ext: No edema, warm Neuro: Alert and oriented, No gross deficits Procedure: The lesion mentioned above was prepped and draped in sterile fashion and local with 2% lidocaine with epi was used to anesthetize the area.  Subsequently an 11 blade was introduced and a wound culture was used to remove loculations and collect a specimen for culture. Assessment and plan:  No diagnosis found.  No orders of the defined types were placed in this encounter.   No orders of the defined types were placed in this encounter.   Follow up as needed.  , MD

## 2020-06-20 LAB — ANAEROBIC AND AEROBIC CULTURE

## 2020-06-21 ENCOUNTER — Encounter: Payer: Self-pay | Admitting: Physician Assistant

## 2020-06-21 ENCOUNTER — Other Ambulatory Visit: Payer: Self-pay

## 2020-06-21 ENCOUNTER — Ambulatory Visit (INDEPENDENT_AMBULATORY_CARE_PROVIDER_SITE_OTHER): Payer: Medicare Other | Admitting: Physician Assistant

## 2020-06-21 VITALS — BP 120/72 | HR 80 | Ht 74.0 in | Wt 198.4 lb

## 2020-06-21 DIAGNOSIS — I10 Essential (primary) hypertension: Secondary | ICD-10-CM

## 2020-06-21 DIAGNOSIS — I480 Paroxysmal atrial fibrillation: Secondary | ICD-10-CM

## 2020-06-21 DIAGNOSIS — E785 Hyperlipidemia, unspecified: Secondary | ICD-10-CM | POA: Diagnosis not present

## 2020-06-21 NOTE — Patient Instructions (Signed)

## 2020-06-24 DIAGNOSIS — E1165 Type 2 diabetes mellitus with hyperglycemia: Secondary | ICD-10-CM | POA: Diagnosis not present

## 2020-07-08 ENCOUNTER — Ambulatory Visit (INDEPENDENT_AMBULATORY_CARE_PROVIDER_SITE_OTHER): Payer: Medicare Other | Admitting: Family Medicine

## 2020-07-08 ENCOUNTER — Encounter: Payer: Self-pay | Admitting: Family Medicine

## 2020-07-08 ENCOUNTER — Other Ambulatory Visit: Payer: Self-pay

## 2020-07-08 VITALS — BP 128/81 | HR 74 | Temp 97.9°F | Ht 74.0 in | Wt 200.2 lb

## 2020-07-08 DIAGNOSIS — L732 Hidradenitis suppurativa: Secondary | ICD-10-CM

## 2020-07-08 DIAGNOSIS — Z0000 Encounter for general adult medical examination without abnormal findings: Secondary | ICD-10-CM | POA: Diagnosis not present

## 2020-07-08 DIAGNOSIS — Z23 Encounter for immunization: Secondary | ICD-10-CM | POA: Diagnosis not present

## 2020-07-08 MED ORDER — SULFAMETHOXAZOLE-TRIMETHOPRIM 800-160 MG PO TABS: 1.0000 | ORAL_TABLET | Freq: Two times a day (BID) | ORAL | 0 refills | Status: DC

## 2020-07-08 NOTE — Progress Notes (Signed)
Subjective:   [name] is a 54 y.o. male who presents for a Welcome to Medicare exam.   Review of Systems: Review of Systems  Constitutional: Negative for chills and fever.  HENT: Negative for ear pain and tinnitus.   Eyes: Negative for blurred vision and pain.  Respiratory: Negative for cough, shortness of breath and wheezing.   Cardiovascular: Negative for chest pain, palpitations and leg swelling.  Gastrointestinal: Negative for abdominal pain, blood in stool, constipation, diarrhea and melena.  Genitourinary: Negative for dysuria and hematuria.  Musculoskeletal: Negative for back pain, joint pain and myalgias.  Skin: Positive for rash.  Neurological: Negative for dizziness, sensory change, focal weakness, weakness and headaches.  Psychiatric/Behavioral: Negative for depression and suicidal ideas.          Objective:    Today's Vitals   07/08/20 1015  BP: 128/81  Pulse: 74  Temp: 97.9 F (36.6 C)  TempSrc: Temporal  Weight: 200 lb 3.2 oz (90.8 kg)  Height: 6\' 2"  (1.88 m)   Body mass index is 25.7 kg/m.  Medications Outpatient Encounter Medications as of 07/08/2020  Medication Sig  . albuterol (PROVENTIL HFA;VENTOLIN HFA) 108 (90 Base) MCG/ACT inhaler Inhale 2 puffs into the lungs every 6 (six) hours as needed for wheezing or shortness of breath.  09/07/2020 apixaban (ELIQUIS) 5 MG TABS tablet Take 1 tablet (5 mg total) by mouth 2 (two) times daily.  Marland Kitchen atorvastatin (LIPITOR) 20 MG tablet TAKE 1 TABLET BY MOUTH EVERY DAY  . Continuous Blood Gluc Receiver (FREESTYLE LIBRE 2 READER) DEVI Use as directed to test blood sugar up to 6 times daily as directed. DX: E 11.9  . Continuous Blood Gluc Sensor (FREESTYLE LIBRE 14 DAY SENSOR) MISC Use as directed to test blood sugar up to 6 times daily as directed. DX: E 11.9  . DULoxetine (CYMBALTA) 60 MG capsule Take 1 capsule (60 mg total) by mouth daily.  . fluticasone (FLONASE) 50 MCG/ACT nasal spray Place 1 spray into both nostrils  2 (two) times daily as needed for allergies or rhinitis.  Marland Kitchen gabapentin (NEURONTIN) 600 MG tablet Take 1 tablet (600 mg total) by mouth 3 (three) times daily.  Marland Kitchen HYDROcodone-acetaminophen (NORCO) 10-325 MG tablet Take 1 tablet by mouth 2 (two) times daily as needed.  . Insulin Glargine, 2 Unit Dial, (TOUJEO MAX SOLOSTAR) 300 UNIT/ML SOPN Inject 40-50 Units into the skin daily. (Patient taking differently: Inject 40-66 Units into the skin daily. )  . metFORMIN (GLUCOPHAGE) 1000 MG tablet Take 1 tablet (1,000 mg total) by mouth 2 (two) times daily.  . metoprolol succinate (TOPROL XL) 25 MG 24 hr tablet Take 1 tablet (25 mg total) by mouth daily.  . minocycline (MINOCIN) 100 MG capsule Take 1 capsule (100 mg total) by mouth 2 (two) times daily. Take on an empty stomach  . Multiple Vitamins-Minerals (MULTIVITAMIN WITH MINERALS) tablet Take 1 tablet by mouth daily.  . Multiple Vitamins-Minerals (THERA-M) TABS Take by mouth.  . Semaglutide,0.25 or 0.5MG /DOS, (OZEMPIC, 0.25 OR 0.5 MG/DOSE,) 2 MG/1.5ML SOPN Inject 0.375 mLs (0.5 mg total) into the skin once a week.  . sulfamethoxazole-trimethoprim (BACTRIM DS) 800-160 MG tablet Take 1 tablet by mouth 2 (two) times daily.   No facility-administered encounter medications on file as of 07/08/2020.     History: Past Medical History:  Diagnosis Date  . Charcot ankle   . Essential hypertension   . Osteomyelitis of toe of right foot (HCC)   . Paroxysmal atrial fibrillation (  HCC)   . Recurrent boils   . Type 2 diabetes mellitus with diabetic neuropathy [facility])    Diagnosed age 55   Past Surgical History:  Procedure Laterality Date  . AMPUTATION Right 06/17/2014   Procedure: PARTIAL AMPUTATION RIGHT 3RD TOE;  Surgeon: [name], DPM;  Location: [facility];  Service: Podiatry;  Laterality: Right;  . AMPUTATION Right 09/02/2014   Procedure: PARTIAL AMPUTATION 2ND TOE RIGHT FOOT;  Surgeon: [name], DPM;  Location: [facility];  Service:  Podiatry;  Laterality: Right;  . AMPUTATION Right 04/01/2015   Procedure: AMPUTATION DIGIT 1ST TOE RIGHT FOOT;  Surgeon: Laurell Josephs, DPM;  Location: [facility];  Service: Podiatry;  Laterality: Right;  . AMPUTATION Right 06/29/2016   pinkeye   . AMPUTATION Right 10/24/2019   Procedure: PARTIAL THIRD RAY AMPUTATION;  Surgeon: Ferman Hamming, DPM;  Location: [facility];  Service: Podiatry;  Laterality: Right;  . AMPUTATION Right 02/19/2020   Procedure: AMPUTATION BELOW KNEE; RIGHT;  Surgeon: [name], MD;  Location: [facility];  Service: General;  Laterality: Right;  . FOOT AMPUTATION Left   . FOOT SURGERY Left 03/2017  . HERNIA REPAIR Bilateral    and 3rd hernia repair does not remember ehat side  . IRRIGATION AND DEBRIDEMENT ABSCESS Right 11/05/2019   Procedure: IRRIGATION AND DRAINAGE ABSCESS RIGHT ANKLE;  Surgeon: Ferman Hamming, DPM;  Location: [facility];  Service: Podiatry;  Laterality: Right;  . TEE WITHOUT CARDIOVERSION N/A 10/29/2019   Procedure: TRANSESOPHAGEAL ECHOCARDIOGRAM (TEE) WITH PROPOFOL;  Surgeon: Pricilla Riffle, MD;  [facility];  Service: Cardiovascular;  Laterality: N/A;    Family History  Problem Relation Age of Onset  . Stroke Father   . Asthma Father   . COPD Father   . Aneurysm Father        AAA  . Diabetes Brother   . Stroke Brother        2008  . Diabetes Mother    Social History   Occupational History  . Occupation: Receiving  Tobacco Use  . Smoking status: Never Smoker  . Smokeless tobacco: Never Used  Vaping Use  . Vaping Use: Never used  Substance and Sexual Activity  . Alcohol use: No  . Drug use: No  . Sexual activity: Not on file   Tobacco Counseling Counseling given: Not Answered   Immunizations and Health Maintenance Immunization History  Administered Date(s) Administered  . Influenza, Seasonal, Injecte, Preservative Fre 10/15/2015, 08/05/2016  . Influenza,inj,Quad PF,6+ Mos 10/15/2015, 08/05/2016, 08/21/2017, 08/07/2018,  07/22/2019  . Moderna SARS-COVID-2 Vaccination 05/20/2020, 06/14/2020  . Pneumococcal Conjugate-13 07/22/2019, 07/08/2020  . Tdap 07/08/2020  . Zoster Recombinat (Shingrix) 06/30/2019, 10/06/2019   Health Maintenance Due  Topic Date Due  . OPHTHALMOLOGY EXAM  06/01/2020  . INFLUENZA VACCINE  06/06/2020    Activities of Daily Living In your present state of health, do you have any difficulty performing the following activities: 02/22/2020 02/18/2020  Hearing? N N  Vision? N N  Difficulty concentrating or making decisions? N N  Walking or climbing stairs? Y Y  Comment Bilateral BKA left BKA  Dressing or bathing? N N  Doing errands, shopping? N N  Some recent data might be hidden    Physical Exam  (optional), or other factors deemed appropriate based on the beneficiary's medical and social history and current clinical standards.  Advanced Directives:      Assessment:    This is a routine wellness  examination for this patient .  Vision/Hearing screen No exam data present  Dietary issues and exercise activities discussed:     Goals   None     Depression Screen PHQ 2/9 Scores 07/08/2020 06/16/2020 04/23/2020 12/23/2019  PHQ - 2 Score 0 0 0 0     Fall Risk Fall Risk  07/08/2020  Falls in the past year? 1  Number falls in past yr: 1  Injury with Fall? 0  Risk for fall due to : History of fall(s);Impaired balance/gait;Impaired mobility;Orthopedic patient  Follow up Falls evaluation completed    Cognitive Function MMSE - Mini Mental State Exam 07/08/2020  Orientation to time 5  Orientation to Place 5  Registration 3  Attention/ Calculation 5  Recall 3  Language- name 2 objects 2  Language- repeat 1  Language- follow 3 step command 3  Language- read & follow direction 1  Write a sentence 1  Copy design 1  Total score 30        Patient Care Team: Taura Lamarre, Elige Radon, MD as PCP - General (Family Medicine) Jonelle Sidle, MD as PCP - Cardiology  (Cardiology) Joelyn Oms, MD as Referring Physician (Orthopedic Surgery) Danella Maiers, [facility] (Pharmacist)     Plan:     Problem List Items Addressed This Visit    None    Visit Diagnoses    Encounter for Medicare annual wellness exam    -  Primary   Hidradenitis suppurativa       Relevant Medications   sulfamethoxazole-trimethoprim (BACTRIM DS) 800-160 MG tablet    Patient wants to hold off on referral for colonoscopy at this point because of Covid, he already has an eye doctor appointment scheduled.  His falls were likely due to mobility issues with his new devices for his bilateral lower extremity amputations.  He says is been doing better and has not fallen recently.  I have personally reviewed and noted the following in the patient's chart:   . Medical and social history . Use of alcohol, tobacco or illicit drugs  . Current medications and supplements . Functional ability and status . Nutritional status . Physical activity . Advanced directives . List of other physicians . Hospitalizations, surgeries, and ER visits in previous 12 months . Vitals . Screenings to include cognitive, depression, and falls . Referrals and appointments  In addition, I have reviewed and discussed with patient certain preventive protocols, quality metrics, and best practice recommendations. A written personalized care plan for preventive services as well as general preventive health recommendations were provided to patient.    Nils Pyle, MD 07/08/2020

## 2020-07-25 DIAGNOSIS — E1165 Type 2 diabetes mellitus with hyperglycemia: Secondary | ICD-10-CM | POA: Diagnosis not present

## 2020-07-26 ENCOUNTER — Telehealth: Payer: Self-pay | Admitting: Family Medicine

## 2020-07-26 NOTE — Telephone Encounter (Signed)
[name] can you see if you can help him look this, in the meantime I will look to see if we have any samples.  It looks like he is having trouble getting his Eliquis

## 2020-07-26 NOTE — Telephone Encounter (Signed)
I called [facility] in [place] and spoke with Davina and she states he is in the coverage gap.   Patient wants to see if there is anything else that he can take or any recommendations on what he can do. He is in the donut hole and can not afford. Please advise

## 2020-07-27 MED ORDER — APIXABAN 5 MG PO TABS: 5.0000 mg | ORAL_TABLET | Freq: Two times a day (BID) | ORAL | 11 refills | Status: DC

## 2020-07-27 NOTE — Telephone Encounter (Signed)
Call placed to patient:  Sending RX for eliquis to [facility].  Patient is already receiving insulin via patient assistance there.  Will print out Eliquis RX and have PCP sign  BGs are looking great in the 100-130s

## 2020-07-29 NOTE — Telephone Encounter (Signed)
RX faxed to [facility] Dept for Eliquis

## 2020-07-30 ENCOUNTER — Ambulatory Visit: Payer: Medicare Other | Admitting: Family Medicine

## 2020-08-03 ENCOUNTER — Telehealth: Payer: Self-pay | Admitting: Family Medicine

## 2020-08-03 NOTE — Telephone Encounter (Signed)
Patient aware and verbalized understanding. °

## 2020-08-03 NOTE — Telephone Encounter (Signed)
Patient is checking on patient assistance. Please advise

## 2020-08-03 NOTE — Telephone Encounter (Signed)
RX for eliquis faxed to [facility] Dept on 07/29/20  Patient to follow up with Health Dept-they will assist with this process  Patient to Call #[phone]   If there are issues with the RX, he can let us know.  It successfully transmitted on our end  Thank you!

## 2020-08-18 LAB — HM DIABETES EYE EXAM

## 2020-08-26 DIAGNOSIS — E1165 Type 2 diabetes mellitus with hyperglycemia: Secondary | ICD-10-CM | POA: Diagnosis not present

## 2020-08-26 DIAGNOSIS — Z794 Long term (current) use of insulin: Secondary | ICD-10-CM | POA: Diagnosis not present

## 2020-09-01 ENCOUNTER — Other Ambulatory Visit: Payer: Self-pay

## 2020-09-01 ENCOUNTER — Ambulatory Visit (INDEPENDENT_AMBULATORY_CARE_PROVIDER_SITE_OTHER): Payer: Medicare Other | Admitting: Family Medicine

## 2020-09-01 ENCOUNTER — Encounter: Payer: Self-pay | Admitting: Family Medicine

## 2020-09-01 VITALS — BP 142/85 | HR 73 | Temp 97.0°F | Ht 74.0 in | Wt 212.0 lb

## 2020-09-01 DIAGNOSIS — G8929 Other chronic pain: Secondary | ICD-10-CM

## 2020-09-01 DIAGNOSIS — E1142 Type 2 diabetes mellitus with diabetic polyneuropathy: Secondary | ICD-10-CM

## 2020-09-01 DIAGNOSIS — Z23 Encounter for immunization: Secondary | ICD-10-CM | POA: Diagnosis not present

## 2020-09-01 DIAGNOSIS — I10 Essential (primary) hypertension: Secondary | ICD-10-CM | POA: Diagnosis not present

## 2020-09-01 DIAGNOSIS — M79673 Pain in unspecified foot: Secondary | ICD-10-CM

## 2020-09-01 DIAGNOSIS — E1165 Type 2 diabetes mellitus with hyperglycemia: Secondary | ICD-10-CM

## 2020-09-01 DIAGNOSIS — Z89512 Acquired absence of left leg below knee: Secondary | ICD-10-CM

## 2020-09-01 DIAGNOSIS — I4811 Longstanding persistent atrial fibrillation: Secondary | ICD-10-CM

## 2020-09-01 DIAGNOSIS — E1140 Type 2 diabetes mellitus with diabetic neuropathy, unspecified: Secondary | ICD-10-CM

## 2020-09-01 DIAGNOSIS — Z0289 Encounter for other administrative examinations: Secondary | ICD-10-CM

## 2020-09-01 DIAGNOSIS — Z794 Long term (current) use of insulin: Secondary | ICD-10-CM

## 2020-09-01 LAB — BAYER DCA HB A1C WAIVED: HB A1C (BAYER DCA - WAIVED): 7.6 % — ABNORMAL HIGH (ref <7.0)

## 2020-09-01 MED ORDER — HYDROCODONE-ACETAMINOPHEN 10-325 MG PO TABS: 1.0000 | ORAL_TABLET | Freq: Two times a day (BID) | ORAL | 0 refills | Status: DC | PRN

## 2020-09-01 MED ORDER — METFORMIN HCL 1000 MG PO TABS: 1000.0000 mg | ORAL_TABLET | Freq: Two times a day (BID) | ORAL | 3 refills | Status: DC

## 2020-09-01 MED ORDER — DULOXETINE HCL 60 MG PO CPEP: 60.0000 mg | ORAL_CAPSULE | Freq: Every day | ORAL | 3 refills | Status: DC

## 2020-09-01 MED ORDER — ATORVASTATIN CALCIUM 20 MG PO TABS: 20.0000 mg | ORAL_TABLET | Freq: Every day | ORAL | 3 refills | Status: DC

## 2020-09-01 MED ORDER — TOUJEO MAX SOLOSTAR 300 UNIT/ML ~~LOC~~ SOPN: 50.0000 [IU] | PEN_INJECTOR | Freq: Every day | SUBCUTANEOUS | 3 refills | Status: DC

## 2020-09-01 NOTE — Progress Notes (Signed)
BP (!) 142/85   Pulse 73   Temp (!) 97 F (36.1 C)   Ht 6\' 2"  (1.88 m)   Wt 212 lb (96.2 kg)   SpO2 98%   BMI 27.22 kg/m    Subjective:   Patient ID: [name], male    DOB: Feb 24, 1966, 54 y.o.   MRN: [id number]  HPI: [name] is a 54 y.o. male presenting on 09/01/2020 for Medical Management of Chronic Issues and Diabetes   HPI Type 2 diabetes mellitus Patient comes in today for recheck of his diabetes. Patient has been currently taking Toujeo and Metformin and Ozempic. Patient is not currently on an ACE inhibitor/ARB. Patient has seen an ophthalmologist this year. Patient denies any issues with their feet. The symptom started onset as an adult retinopathy and hyperlipidemia and hypertension ARE RELATED TO DM   Hypertension Patient is currently on metoprolol, and their blood pressure today is 142/85. Patient denies any lightheadedness or dizziness. Patient denies headaches, blurred vision, chest pains, shortness of breath, or weakness. Denies any side effects from medication and is content with current medication.   Pain assessment: Cause of pain-bilateral lower extremity pain status post BKA bilaterally Pain location-bilateral lower extremity knees and hips and below Pain on scale of 1-10- 5 Frequency-Daily What increases pain-movement and walking and being on his feet, has bilateral prosthetics What makes pain Better-hydrocodone Effects on ADL -does get around pretty good but is limited some by the pain. Any change in general medical condition-none  Current opioids rx-hydrocodone 10-3 25 twice daily as needed # meds rx-60  Effectiveness of current meds-working well Adverse reactions from pain meds-none Morphine equivalent-20  Pill count performed-No Last drug screen -04/27/2020 ( high risk q6m, moderate risk q6m, low risk yearly ) Urine drug screen today- No Was the NCCSR reviewed-yes  If yes were their any concerning findings? -None   No flowsheet data  found.   Pain contract signed on: 04/27/2020  Relevant past medical, surgical, family and social history reviewed and updated as indicated. Interim medical history since our last visit reviewed. Allergies and medications reviewed and updated.  Review of Systems  Constitutional: Negative for chills and fever.  Eyes: Negative for discharge.  Respiratory: Negative for shortness of breath and wheezing.   Cardiovascular: Negative for chest pain and leg swelling.  Musculoskeletal: Positive for arthralgias and gait problem. Negative for back pain.  Skin: Negative for rash.  All other systems reviewed and are negative.   Per HPI unless specifically indicated above   Allergies as of 09/01/2020   No Known Allergies     Medication List       Accurate as of September 01, 2020  1:54 PM. If you have any questions, ask your nurse or doctor.        albuterol 108 (90 Base) MCG/ACT inhaler Commonly known as: VENTOLIN HFA Inhale 2 puffs into the lungs every 6 (six) hours as needed for wheezing or shortness of breath.   apixaban 5 MG Tabs tablet Commonly known as: ELIQUIS Take 1 tablet (5 mg total) by mouth 2 (two) times daily.   atorvastatin 20 MG tablet Commonly known as: LIPITOR TAKE 1 TABLET BY MOUTH EVERY DAY   DULoxetine 60 MG capsule Commonly known as: Cymbalta Take 1 capsule (60 mg total) by mouth daily.   fluticasone 50 MCG/ACT nasal spray Commonly known as: FLONASE Place 1 spray into both nostrils 2 (two) times daily as needed for allergies or rhinitis.   FreeStyle  Libre 14 Day Sensor Misc Use as directed to test blood sugar up to 6 times daily as directed. DX: E 11.9   FreeStyle Libre 2 Reader [facility] Use as directed to test blood sugar up to 6 times daily as directed. DX: E 11.9   gabapentin 600 MG tablet Commonly known as: Neurontin Take 1 tablet (600 mg total) by mouth 3 (three) times daily.   HYDROcodone-acetaminophen 10-325 MG tablet Commonly known as: NORCO Take  1 tablet by mouth 2 (two) times daily as needed.   metFORMIN 1000 MG tablet Commonly known as: GLUCOPHAGE Take 1 tablet (1,000 mg total) by mouth 2 (two) times daily.   metoprolol succinate 25 MG 24 hr tablet Commonly known as: Toprol XL Take 1 tablet (25 mg total) by mouth daily.   minocycline 100 MG capsule Commonly known as: Minocin Take 1 capsule (100 mg total) by mouth 2 (two) times daily. Take on an empty stomach   multivitamin with minerals tablet Take 1 tablet by mouth daily.   Thera-M Tabs Take by mouth.   Ozempic (0.25 or 0.5 MG/DOSE) 2 MG/1.5ML Sopn Generic drug: Semaglutide(0.25 or 0.5MG /DOS) Inject 0.375 mLs (0.5 mg total) into the skin once a week.   sulfamethoxazole-trimethoprim 800-160 MG tablet Commonly known as: BACTRIM DS Take 1 tablet by mouth 2 (two) times daily.   Toujeo Max SoloStar 300 UNIT/ML Solostar Pen Generic drug: insulin glargine (2 Unit Dial) Inject 40-50 Units into the skin daily. What changed: how much to take        Objective:   BP (!) 142/85   Pulse 73   Temp (!) 97 F (36.1 C)   Ht 6\' 2"  (1.88 m)   Wt 212 lb (96.2 kg)   SpO2 98%   BMI 27.22 kg/m   Wt Readings from Last 3 Encounters:  09/01/20 212 lb (96.2 kg)  07/08/20 200 lb 3.2 oz (90.8 kg)  06/21/20 198 lb 6.4 oz (90 kg)    Physical Exam Vitals and nursing note reviewed.  Constitutional:      General: He is not in acute distress.    Appearance: He is well-developed. He is not diaphoretic.  Eyes:     General: No scleral icterus.    Conjunctiva/sclera: Conjunctivae normal.  Neck:     Thyroid: No thyromegaly.  Cardiovascular:     Rate and Rhythm: Normal rate and regular rhythm.     Heart sounds: Normal heart sounds. No murmur heard.   Pulmonary:     Effort: Pulmonary effort is normal. No respiratory distress.     Breath sounds: Normal breath sounds. No wheezing.  Musculoskeletal:        General: Normal range of motion.     Cervical back: Neck supple.    Lymphadenopathy:     Cervical: No cervical adenopathy.  Skin:    General: Skin is warm and dry.     Findings: No rash.  Neurological:     Mental Status: He is alert and oriented to person, place, and time.     Coordination: Coordination normal.  Psychiatric:        Behavior: Behavior normal.       Assessment & Plan:   Problem List Items Addressed This Visit      Cardiovascular and Mediastinum   Essential hypertension, benign   Relevant Medications   atorvastatin (LIPITOR) 20 MG tablet   A-fib (HCC)   Relevant Medications   atorvastatin (LIPITOR) 20 MG tablet     Endocrine   Type  2 diabetes mellitus (HCC)   Relevant Medications   atorvastatin (LIPITOR) 20 MG tablet   metFORMIN (GLUCOPHAGE) 1000 MG tablet   insulin glargine, 2 Unit Dial, (TOUJEO MAX SOLOSTAR) 300 UNIT/ML Solostar Pen   Uncontrolled type 2 diabetes mellitus with hyperglycemia, with long-term current use of insulin (HCC) - Primary   Relevant Medications   atorvastatin (LIPITOR) 20 MG tablet   metFORMIN (GLUCOPHAGE) 1000 MG tablet   insulin glargine, 2 Unit Dial, (TOUJEO MAX SOLOSTAR) 300 UNIT/ML Solostar Pen   Other Relevant Orders   Bayer DCA Hb A1c Waived     Other   Pain management contract signed   Relevant Medications   HYDROcodone-acetaminophen (NORCO) 10-325 MG tablet   HYDROcodone-acetaminophen (NORCO) 10-325 MG tablet (Start on 10/01/2020)   HYDROcodone-acetaminophen (NORCO) 10-325 MG tablet (Start on 10/31/2020)   S/P BKA (below knee amputation) unilateral, left (HCC)   Relevant Medications   HYDROcodone-acetaminophen (NORCO) 10-325 MG tablet   HYDROcodone-acetaminophen (NORCO) 10-325 MG tablet (Start on 10/01/2020)   HYDROcodone-acetaminophen (NORCO) 10-325 MG tablet (Start on 10/31/2020)    Other Visit Diagnoses    Need for immunization against influenza       Relevant Orders   Flu Vaccine QUAD 36+ mos IM (Completed)   Chronic foot pain, unspecified laterality       Relevant  Medications   DULoxetine (CYMBALTA) 60 MG capsule   HYDROcodone-acetaminophen (NORCO) 10-325 MG tablet   HYDROcodone-acetaminophen (NORCO) 10-325 MG tablet (Start on 10/01/2020)   HYDROcodone-acetaminophen (NORCO) 10-325 MG tablet (Start on 10/31/2020)      Continue current medication, A1c much better at 7.6, continue to focus on diet and eating right.  No change in medication for now. Follow up plan: Return in about 3 months (around 12/02/2020), or if symptoms worsen or fail to improve, for Diabetes and pain management..  Counseling provided for all of the vaccine components Orders Placed This Encounter  Procedures  . Flu Vaccine QUAD 36+ mos IM  . B[facility] Hb A1c Waived    Arville Care, MD [facility] 09/01/2020, 1:54 PM

## 2020-09-03 ENCOUNTER — Telehealth: Payer: Self-pay

## 2020-09-03 NOTE — Telephone Encounter (Signed)
We can apologize about the Toujeo being sent there but no we do not send them typically to health department, they go through on their own, they do their own thing.  He had just asked for all refills so I just sent them.  The health department gets their own medicines and paper scripts separately.

## 2020-09-03 NOTE — Telephone Encounter (Signed)
[name] informed. May be changing to [facility] for prescriptions.

## 2020-09-03 NOTE — Telephone Encounter (Signed)
Patient is upset because his dad picked up 3 medications for him and it was $100.  States he does not get his Troujo from [facility] - he gets it from the health department.  Apologized to patient about medication being sent to the wrong pharmacy.  Patient wants to know if we need to send refills to the health department?

## 2020-09-26 DIAGNOSIS — E1165 Type 2 diabetes mellitus with hyperglycemia: Secondary | ICD-10-CM | POA: Diagnosis not present

## 2020-09-26 DIAGNOSIS — Z794 Long term (current) use of insulin: Secondary | ICD-10-CM | POA: Diagnosis not present

## 2020-10-12 ENCOUNTER — Encounter: Payer: Self-pay | Admitting: Family

## 2020-10-12 ENCOUNTER — Other Ambulatory Visit: Payer: Self-pay

## 2020-10-12 ENCOUNTER — Ambulatory Visit (INDEPENDENT_AMBULATORY_CARE_PROVIDER_SITE_OTHER): Payer: Medicare Other | Admitting: Family

## 2020-10-12 VITALS — BP 128/87 | HR 77 | Temp 97.4°F | Ht 74.0 in | Wt 206.6 lb

## 2020-10-12 DIAGNOSIS — E1165 Type 2 diabetes mellitus with hyperglycemia: Secondary | ICD-10-CM

## 2020-10-12 DIAGNOSIS — L02419 Cutaneous abscess of limb, unspecified: Secondary | ICD-10-CM

## 2020-10-12 DIAGNOSIS — L89890 Pressure ulcer of other site, unstageable: Secondary | ICD-10-CM | POA: Diagnosis not present

## 2020-10-12 DIAGNOSIS — Z794 Long term (current) use of insulin: Secondary | ICD-10-CM

## 2020-10-12 DIAGNOSIS — Z89511 Acquired absence of right leg below knee: Secondary | ICD-10-CM

## 2020-10-12 MED ORDER — DOXYCYCLINE HYCLATE 100 MG PO TABS: 100.0000 mg | ORAL_TABLET | Freq: Two times a day (BID) | ORAL | 0 refills | Status: DC

## 2020-10-12 MED ORDER — CEFTRIAXONE SODIUM 1 G IJ SOLR
1.0000 g | Freq: Once | INTRAMUSCULAR | Status: AC
  Administered 2020-10-12: 1 g via INTRAMUSCULAR

## 2020-10-12 NOTE — Progress Notes (Signed)
Subjective:    Patient ID: [name], male    DOB: [birth year], 54 y.o.   MRN: [id number]  Chief Complaint  Patient presents with  . Sore    right legx 2 weeks   . Cyst    under left arm pit, sore x 2 weeks     HPI Pt presents to the office today with a sore on right extremity. He has a right BKA and states his prosthesis has been hurting over the last 4 weeks. He noticed this sore about two weeks ago.   He has an appointment with his prosthesis provider today.   He reports his pain is a 4 out 10, the area is red, warm, and bloody discharge.   His las A1C 7.6.  He is also complaining abscess 3 abescess in his axilla he noticed over the last week that are tender. Denies any discharge.    Review of Systems     Objective:   Physical Exam Vitals reviewed.  Constitutional:      General: He is not in acute distress.    Appearance: He is well-developed.  HENT:     Head: Normocephalic.  Eyes:     General:        Right eye: No discharge.        Left eye: No discharge.     Pupils: Pupils are equal, round, and reactive to light.  Neck:     Thyroid: No thyromegaly.  Cardiovascular:     Rate and Rhythm: Normal rate and regular rhythm.     Heart sounds: Normal heart sounds. No murmur heard.   Pulmonary:     Effort: Pulmonary effort is normal. No respiratory distress.     Breath sounds: Normal breath sounds. No wheezing.  Abdominal:     General: Bowel sounds are normal. There is no distension.     Palpations: Abdomen is soft.     Tenderness: There is no abdominal tenderness.  Musculoskeletal:        General: Tenderness present.     Cervical back: Normal range of motion and neck supple.  Skin:    General: Skin is warm and dry.     Findings: No erythema or rash.          Comments: Area erythemas, unstagable pressure approx 1.4X1.5cm. Area erythemas, hard and tender.   Three small abscess in left axilla, Tender and hard. No discharge or warmth.    Neurological:      Mental Status: He is alert and oriented to person, place, and time.     Cranial Nerves: No cranial nerve deficit.     Deep Tendon Reflexes: Reflexes are normal and symmetric.  Psychiatric:        Behavior: Behavior normal.        Thought Content: Thought content normal.        Judgment: Judgment normal.          BP 128/87   Pulse 77   Temp (!) 97.4 F (36.3 C) (Temporal)   Ht 6\' 2"  (1.88 m)   Wt 206 lb 9.6 oz (93.7 kg)   SpO2 98%   BMI 26.53 kg/m      Assessment & Plan:  [name] comes in today with chief complaint of Sore (right legx 2 weeks ) and Cyst (under left arm pit, sore x 2 weeks )   Diagnosis and orders addressed:  1. Uncontrolled type 2 diabetes mellitus with hyperglycemia, with long-term current use of  insulin (HCC)  - cefTRIAXone (ROCEPHIN) injection 1 g - doxycycline (VIBRA-TABS) 100 MG tablet; Take 1 tablet (100 mg total) by mouth 2 (two) times daily.  Dispense: 20 tablet; Refill: 0 - AMB referral to wound care center  2. S/P BKA (below knee amputation) unilateral, right (HCC) - cefTRIAXone (ROCEPHIN) injection 1 g - doxycycline (VIBRA-TABS) 100 MG tablet; Take 1 tablet (100 mg total) by mouth 2 (two) times daily.  Dispense: 20 tablet; Refill: 0 - AMB referral to wound care center  3. Pressure injury of other site, unstageable (HCC) - cefTRIAXone (ROCEPHIN) injection 1 g - doxycycline (VIBRA-TABS) 100 MG tablet; Take 1 tablet (100 mg total) by mouth 2 (two) times daily.  Dispense: 20 tablet; Refill: 0 - AMB referral to wound care center  4. Abscess, axilla - doxycycline (VIBRA-TABS) 100 MG tablet; Take 1 tablet (100 mg total) by mouth 2 (two) times daily.  Dispense: 20 tablet; Refill: 0 - AMB referral to wound care center   Needs follow up with PCP next week Referral to wound care pending Keep appt with prosthetics provider today!!    Jannifer Rodney, FNP

## 2020-10-12 NOTE — Patient Instructions (Signed)
Pressure Injury  A pressure injury is damage to the skin and underlying tissue that results from pressure being applied to an area of the body. It often affects people who must spend a long time in a bed or chair because of a medical condition. Pressure injuries usually occur:  Over bony parts of the body, such as the tailbone, shoulders, elbows, hips, heels, spine, ankles, and back of the head.  Under medical devices that make contact with the body, such as respiratory equipment, stockings, tubes, and splints. Pressure injuries start as reddened areas on the skin and can lead to pain and an open wound. What are the causes? This condition is caused by frequent or constant pressure to an area of the body. Decreased blood flow to the skin can eventually cause the skin tissue to die and break down, causing a wound. What increases the risk? You are more likely to develop this condition if you:  Are in the hospital or an extended care facility.  Are bedridden or in a wheelchair.  Have an injury or disease that keeps you from: ? Moving normally. ? Feeling pain or pressure.  Have a condition that: ? Makes you sleepy or less alert. ? Causes poor blood flow.  Need to wear a medical device.  Have poor control of your bladder or bowel functions (incontinence).  Have poor nutrition (malnutrition). If you are at risk for pressure injuries, your health care provider may recommend certain types of mattresses, mattress covers, pillows, cushions, or boots to help prevent them. These may include products filled with air, foam, gel, or sand. What are the signs or symptoms? Symptoms of this condition depend on the severity of the injury. Symptoms may include:  Red or dark areas of the skin.  Pain, warmth, or a change of skin texture.  Blisters.  An open wound. How is this diagnosed? This condition is diagnosed with a medical history and physical exam. You may also have tests, such as:  Blood  tests.  Imaging tests.  Blood flow tests. Your pressure injury will be staged based on its severity. Staging is based on:  The depth of the tissue injury, including whether there is exposure of muscle, bone, or tendon.  The cause of the pressure injury. How is this treated? This condition may be treated by:  Relieving or redistributing pressure on your skin. This includes: ? Frequently changing your position. ? Avoiding positions that caused the wound or that can make the wound worse. ? Using specific bed mattresses, chair cushions, or protective boots. ? Moving medical devices from an area of pressure, or placing padding between the skin and the device. ? Using foams, creams, or powders to prevent rubbing (friction) on the skin.  Keeping your skin clean and dry. This may include using a skin cleanser or skin barrier as told by your health care provider.  Cleaning your injury and removing any dead tissue from the wound (debridement).  Placing a bandage (dressing) over your injury.  Using medicines for pain or to prevent or treat infection. Surgery may be needed if other treatments are not working or if your injury is very deep. Follow these instructions at home: Wound care  Follow instructions from your health care provider about how to take care of your wound. Make sure you: ? Wash your hands with soap and water before and after you change your bandage (dressing). If soap and water are not available, use hand sanitizer. ? Change your dressing as told   by your health care provider.  Check your wound every day for signs of infection. Have a caregiver do this for you if you are not able. Check for: ? Redness, swelling, or increased pain. ? More fluid or blood. ? Warmth. ? Pus or a bad smell. Skin care  Keep your skin clean and dry. Gently pat your skin dry.  Do not rub or massage your skin.  You or a caregiver should check your skin every day for any changes in color or  any new blisters or sores (ulcers). Medicines  Take over-the-counter and prescription medicines only as told by your health care provider.  If you were prescribed an antibiotic medicine, take or apply it as told by your health care provider. Do not stop using the antibiotic even if your condition improves. Reducing and redistributing pressure  Do not lie or sit in one position for a long time. Move or change position every 1-2 hours, or as told by your health care provider.  Use pillows or cushions to reduce pressure. Ask your health care provider to recommend cushions or pads for you. General instructions   Eat a healthy diet that includes lots of protein.  Drink enough fluid to keep your urine pale yellow.  Be as active as you can every day. Ask your health care provider to suggest safe exercises or activities.  Do not abuse drugs or alcohol.  Do not use any products that contain nicotine or tobacco, such as cigarettes, e-cigarettes, and chewing tobacco. If you need help quitting, ask your health care provider.  Keep all follow-up visits as told by your health care provider. This is important. Contact a health care provider if:  You have: ? A fever or chills. ? Pain that is not helped by medicine. ? Any changes in skin color. ? New blisters or sores. ? Pus or a bad smell coming from your wound. ? Redness, swelling, or pain around your wound. ? More fluid or blood coming from your wound.  Your wound does not improve after 1-2 weeks of treatment. Summary  A pressure injury is damage to the skin and underlying tissue that results from pressure being applied to an area of the body.  Do not lie or sit in one position for a long time. Your health care provider may advise you to move or change position every 1-2 hours.  Follow instructions from your health care provider about how to take care of your wound.  Keep all follow-up visits as told by your health care provider. This  is important. This information is not intended to replace advice given to you by your health care provider. Make sure you discuss any questions you have with your health care provider. Document Revised: 05/22/2018 Document Reviewed: 05/22/2018 Elsevier Patient Education  2020 [employer] Inc[employer]  

## 2020-10-20 ENCOUNTER — Encounter: Payer: Self-pay | Admitting: Family Medicine

## 2020-10-20 ENCOUNTER — Ambulatory Visit (INDEPENDENT_AMBULATORY_CARE_PROVIDER_SITE_OTHER): Payer: Medicare Other | Admitting: Family Medicine

## 2020-10-20 ENCOUNTER — Other Ambulatory Visit: Payer: Self-pay

## 2020-10-20 VITALS — BP 141/71 | HR 76 | Temp 98.0°F | Ht 74.0 in | Wt 206.0 lb

## 2020-10-20 DIAGNOSIS — L89890 Pressure ulcer of other site, unstageable: Secondary | ICD-10-CM

## 2020-10-20 NOTE — Progress Notes (Signed)
BP (!) 141/71   Pulse 76   Temp 98 F (36.7 C)   Ht 6\' 2"  (1.88 m)   Wt 206 lb (93.4 kg)   SpO2 97%   BMI 26.45 kg/m    Subjective:   Patient ID: [name], male    DOB: [birth year], 54 y.o.   MRN: [id number]  HPI: [name] is a 54 y.o. male presenting on 10/20/2020 for Diabetes and Diabetic Ulcer (Right stump)   HPI Patient is coming in for diabetic ulcer on his right leg stump.  He got this for a pressure sore from his prosthetic leg.  He has since gone to the place that works on his prosthetic leg and had it retold it does not cause pressure.  Been doing daily wound changes and air in and out at night.  He is still taking the oral antibiotic as well  Relevant past medical, surgical, family and social history reviewed and updated as indicated. Interim medical history since our last visit reviewed. Allergies and medications reviewed and updated.  Review of Systems  Constitutional: Negative for chills and fever.  Respiratory: Negative for shortness of breath and wheezing.   Cardiovascular: Negative for chest pain and leg swelling.  Musculoskeletal: Negative for back pain and gait problem.  Skin: Positive for wound. Negative for rash.  All other systems reviewed and are negative.  Per HPI unless specifically indicated above  Objective:   BP (!) 141/71   Pulse 76   Temp 98 F (36.7 C)   Ht 6\' 2"  (1.88 m)   Wt 206 lb (93.4 kg)   SpO2 97%   BMI 26.45 kg/m   Wt Readings from Last 3 Encounters:  10/20/20 206 lb (93.4 kg)  10/12/20 206 lb 9.6 oz (93.7 kg)  09/01/20 212 lb (96.2 kg)    Physical Exam Vitals and nursing note reviewed.  Constitutional:      General: He is not in acute distress.    Appearance: He is well-developed and well-nourished. He is not diaphoretic.  Eyes:     Extraocular Movements: EOM normal.  Neck:     Thyroid: No thyromegaly.  Cardiovascular:     Pulses: Intact distal pulses.  Musculoskeletal:        General: No edema.  Skin:     General: Skin is warm and dry.     Findings: Wound (Right medial open ulceration stage II, superficial, about 2-1/2 cm in diameter and 2 smaller openings that are almost healed) present. No rash.  Neurological:     Mental Status: He is alert and oriented to person, place, and time.     Coordination: Coordination normal.  Psychiatric:        Mood and Affect: Mood and affect normal.        Behavior: Behavior normal.       Assessment & Plan:   Problem List Items Addressed This Visit   None   Visit Diagnoses    Pressure injury of other site, unstageable (HCC)    -  Primary    Recommended Xeroform gauze and then roll gauze during the day, change daily, can also use triple antibiotic ointment on it, recommended to stop using Betadine and hydrogen peroxide on the wound.  Follow up plan: Return if symptoms worsen or fail to improve.  Counseling provided for all of the vaccine components No orders of the defined types were placed in this encounter.   14/07/21, MD [facility] 10/20/2020, 4:21  PM

## 2020-10-27 DIAGNOSIS — E1165 Type 2 diabetes mellitus with hyperglycemia: Secondary | ICD-10-CM | POA: Diagnosis not present

## 2020-10-27 DIAGNOSIS — Z794 Long term (current) use of insulin: Secondary | ICD-10-CM | POA: Diagnosis not present

## 2020-11-03 ENCOUNTER — Other Ambulatory Visit: Payer: Self-pay | Admitting: Family Medicine

## 2020-11-03 DIAGNOSIS — E1142 Type 2 diabetes mellitus with diabetic polyneuropathy: Secondary | ICD-10-CM

## 2020-11-04 ENCOUNTER — Ambulatory Visit: Payer: Medicare Other | Admitting: Family Medicine

## 2020-11-29 ENCOUNTER — Other Ambulatory Visit: Payer: Self-pay

## 2020-11-29 ENCOUNTER — Ambulatory Visit (INDEPENDENT_AMBULATORY_CARE_PROVIDER_SITE_OTHER): Payer: Medicare Other

## 2020-11-29 ENCOUNTER — Ambulatory Visit (INDEPENDENT_AMBULATORY_CARE_PROVIDER_SITE_OTHER): Payer: Medicare Other | Admitting: Family Medicine

## 2020-11-29 ENCOUNTER — Encounter: Payer: Self-pay | Admitting: Family Medicine

## 2020-11-29 VITALS — BP 136/88 | HR 73 | Temp 97.1°F | Ht 74.0 in | Wt 209.6 lb

## 2020-11-29 DIAGNOSIS — W19XXXA Unspecified fall, initial encounter: Secondary | ICD-10-CM

## 2020-11-29 DIAGNOSIS — M25511 Pain in right shoulder: Secondary | ICD-10-CM

## 2020-11-29 DIAGNOSIS — E1165 Type 2 diabetes mellitus with hyperglycemia: Secondary | ICD-10-CM | POA: Diagnosis not present

## 2020-11-29 DIAGNOSIS — Z794 Long term (current) use of insulin: Secondary | ICD-10-CM | POA: Diagnosis not present

## 2020-11-29 MED ORDER — BETAMETHASONE SOD PHOS & ACET 6 (3-3) MG/ML IJ SUSP
6.0000 mg | Freq: Once | INTRAMUSCULAR | Status: AC
  Administered 2020-11-29: 6 mg via INTRAMUSCULAR

## 2020-11-29 NOTE — Progress Notes (Signed)
Chief Complaint  Patient presents with  . Back Pain    Right Shoulder blade     HPI  Patient presents today for pain in the right shoulder that extends from the posterior right angle of the scapula to the superior deltoid to the upper right arm. Onset several months ago. Worsening. No relief with OTC analgesics, topicals  and a TENS unit.   PMH: Smoking status noted ROS: Per HPI  Objective: BP 136/88   Pulse 73   Temp (!) 97.1 F (36.2 C) (Temporal)   Ht 6\' 2"  (1.88 m)   Wt 209 lb 9.6 oz (95.1 kg)   BMI 26.91 kg/m  Gen: NAD, alert, cooperative with exam HEENT: NCAT, EOMI, PERRL CV: RRR, good S1/S2, no murmur Resp: CTABL, no wheezes, non-labored Ext: No edema, warm. Tender posteriorly at the right rhomboideus. Also at the superior deltoid and trapezius.  Neuro: Alert and oriented, No gross deficits  Assessment and plan:  1. Acute pain of right shoulder   2. Fall, initial encounter     Meds ordered this encounter  Medications  . betamethasone acetate-betamethasone sodium phosphate (CELESTONE) injection 6 mg    Orders Placed This Encounter  Procedures  . DG Shoulder Right    Standing Status:   Future    Number of Occurrences:   1    Standing Expiration Date:   11/29/2021    Order Specific Question:   Reason for Exam (SYMPTOM  OR DIAGNOSIS REQUIRED)    Answer:   shoulder pain, fall    Order Specific Question:   Preferred imaging location?    Answer:   Internal    Follow up as needed.  12/01/2021, MD

## 2020-12-03 ENCOUNTER — Other Ambulatory Visit: Payer: Self-pay

## 2020-12-03 ENCOUNTER — Ambulatory Visit (INDEPENDENT_AMBULATORY_CARE_PROVIDER_SITE_OTHER): Payer: Medicare Other | Admitting: Family Medicine

## 2020-12-03 ENCOUNTER — Encounter: Payer: Self-pay | Admitting: Family Medicine

## 2020-12-03 VITALS — BP 142/85 | HR 75 | Ht 74.0 in | Wt 211.0 lb

## 2020-12-03 DIAGNOSIS — E1165 Type 2 diabetes mellitus with hyperglycemia: Secondary | ICD-10-CM

## 2020-12-03 DIAGNOSIS — I10 Essential (primary) hypertension: Secondary | ICD-10-CM

## 2020-12-03 DIAGNOSIS — Z1211 Encounter for screening for malignant neoplasm of colon: Secondary | ICD-10-CM

## 2020-12-03 DIAGNOSIS — E1142 Type 2 diabetes mellitus with diabetic polyneuropathy: Secondary | ICD-10-CM

## 2020-12-03 DIAGNOSIS — M79673 Pain in unspecified foot: Secondary | ICD-10-CM

## 2020-12-03 DIAGNOSIS — G8929 Other chronic pain: Secondary | ICD-10-CM

## 2020-12-03 DIAGNOSIS — Z89512 Acquired absence of left leg below knee: Secondary | ICD-10-CM | POA: Diagnosis not present

## 2020-12-03 DIAGNOSIS — M25511 Pain in right shoulder: Secondary | ICD-10-CM | POA: Diagnosis not present

## 2020-12-03 DIAGNOSIS — Z794 Long term (current) use of insulin: Secondary | ICD-10-CM | POA: Diagnosis not present

## 2020-12-03 DIAGNOSIS — Z0289 Encounter for other administrative examinations: Secondary | ICD-10-CM | POA: Diagnosis not present

## 2020-12-03 LAB — BAYER DCA HB A1C WAIVED: HB A1C (BAYER DCA - WAIVED): 7.2 % — ABNORMAL HIGH

## 2020-12-03 MED ORDER — HYDROCODONE-ACETAMINOPHEN 10-325 MG PO TABS: 1.0000 | ORAL_TABLET | Freq: Two times a day (BID) | ORAL | 0 refills | Status: DC | PRN

## 2020-12-03 MED ORDER — METHYLPREDNISOLONE ACETATE 80 MG/ML IJ SUSP
80.0000 mg | Freq: Once | INTRAMUSCULAR | Status: AC
  Administered 2020-12-03: 80 mg via INTRAMUSCULAR

## 2020-12-03 NOTE — Progress Notes (Signed)
BP (!) 142/85   Pulse 75   Ht 6' 2"  (1.88 m)   Wt 211 lb (95.7 kg)   SpO2 98%   BMI 27.09 kg/m    Subjective:   Patient ID: [name], male    DOB: 1966/02/17, 55 y.o.   MRN: [id number]  HPI: [name] is a 55 y.o. male presenting on 12/03/2020 for Medical Management of Chronic Issues   HPI Type 2 diabetes mellitus Patient comes in today for recheck of his diabetes. Patient has been currently taking Metformin and Toujeo and Ozempic and doing dietary changes.  A1c is 7.2.. Patient is not currently on an ACE inhibitor/ARB. Patient has not seen an ophthalmologist this year.  Patient has bilateral below the knee amputations.  The symptom started onset as an adult diabetes and hypertension and A. fib and neuropathy and bilateral amputations ARE RELATED TO DM   Hypertension Patient is currently on metoprolol, and their blood pressure today is 142/85. Patient denies any lightheadedness or dizziness. Patient denies headaches, blurred vision, chest pains, shortness of breath, or weakness. Denies any side effects from medication and is content with current medication.   A. fib recheck Patient is coming in for A. fib recheck.  He is currently taking Eliquis and denies any major issues with bleeding or bruising.  He denies any chest pain or palpitations.  Patient has continued to have neck and shoulder pain going down to his right arm.  It did get slightly better with the injection but has not fully gotten better.  He is still fighting with this and would like to see if he can do something for now.  He did not want referral at this point because he has some things to be with with his father's health I would like to try another injection and see if he can keep on top of it with that and is coming continue with massage and TENS units and his hydrocodone and gabapentin.  Relevant past medical, surgical, family and social history reviewed and updated as indicated. Interim medical history since  our last visit reviewed. Allergies and medications reviewed and updated.  Review of Systems  Constitutional: Negative for chills and fever.  Eyes: Negative for visual disturbance.  Respiratory: Negative for shortness of breath and wheezing.   Cardiovascular: Negative for chest pain and leg swelling.  Musculoskeletal: Positive for arthralgias, myalgias and neck pain. Negative for back pain and gait problem.  Skin: Negative for rash.  Neurological: Negative for dizziness, weakness and numbness.  All other systems reviewed and are negative.   Per HPI unless specifically indicated above   Allergies as of 12/03/2020   No Known Allergies     Medication List       Accurate as of December 03, 2020 11:37 AM. If you have any questions, ask your nurse or doctor.        albuterol 108 (90 Base) MCG/ACT inhaler Commonly known as: VENTOLIN HFA Inhale 2 puffs into the lungs every 6 (six) hours as needed for wheezing or shortness of breath.   apixaban 5 MG Tabs tablet Commonly known as: ELIQUIS Take 1 tablet (5 mg total) by mouth 2 (two) times daily.   atorvastatin 20 MG tablet Commonly known as: LIPITOR Take 1 tablet (20 mg total) by mouth daily.   doxycycline 100 MG tablet Commonly known as: VIBRA-TABS Take 1 tablet (100 mg total) by mouth 2 (two) times daily.   DULoxetine 60 MG capsule Commonly known as: Cymbalta  Take 1 capsule (60 mg total) by mouth daily.   fluticasone 50 MCG/ACT nasal spray Commonly known as: FLONASE Place 1 spray into both nostrils 2 (two) times daily as needed for allergies or rhinitis.   FreeStyle Libre 14 Day Sensor Misc Use as directed to test blood sugar up to 6 times daily as directed. DX: E 11.9   FreeStyle Libre 2 Reader [facility] Use as directed to test blood sugar up to 6 times daily as directed. DX: E 11.9   gabapentin 600 MG tablet Commonly known as: NEURONTIN TAKE 1 TABLET BY MOUTH THREE TIMES A DAY   HYDROcodone-acetaminophen 10-325 MG  tablet Commonly known as: NORCO Take 1 tablet by mouth 2 (two) times daily as needed.   HYDROcodone-acetaminophen 10-325 MG tablet Commonly known as: NORCO Take 1 tablet by mouth 2 (two) times daily as needed.   HYDROcodone-acetaminophen 10-325 MG tablet Commonly known as: NORCO Take 1 tablet by mouth 2 (two) times daily as needed.   metFORMIN 1000 MG tablet Commonly known as: GLUCOPHAGE Take 1 tablet (1,000 mg total) by mouth 2 (two) times daily.   metoprolol succinate 25 MG 24 hr tablet Commonly known as: Toprol XL Take 1 tablet (25 mg total) by mouth daily.   minocycline 100 MG capsule Commonly known as: Minocin Take 1 capsule (100 mg total) by mouth 2 (two) times daily. Take on an empty stomach   multivitamin with minerals tablet Take 1 tablet by mouth daily.   Thera-M Tabs Take by mouth.   Ozempic (0.25 or 0.5 MG/DOSE) 2 MG/1.5ML Sopn Generic drug: Semaglutide(0.25 or 0.5MG/DOS) Inject 0.375 mLs (0.5 mg total) into the skin once a week.   Toujeo Max SoloStar 300 UNIT/ML Solostar Pen Generic drug: insulin glargine (2 Unit Dial) Inject 50-70 Units into the skin daily.        Objective:   BP (!) 142/85   Pulse 75   Ht 6' 2"  (1.88 m)   Wt 211 lb (95.7 kg)   SpO2 98%   BMI 27.09 kg/m   Wt Readings from Last 3 Encounters:  12/03/20 211 lb (95.7 kg)  11/29/20 209 lb 9.6 oz (95.1 kg)  10/20/20 206 lb (93.4 kg)    Physical Exam Vitals and nursing note reviewed.  Constitutional:      General: He is not in acute distress.    Appearance: He is well-developed and well-nourished. He is not diaphoretic.  Eyes:     General: No scleral icterus.    Extraocular Movements: EOM normal.     Conjunctiva/sclera: Conjunctivae normal.  Neck:     Thyroid: No thyromegaly.  Cardiovascular:     Rate and Rhythm: Normal rate and regular rhythm.     Pulses: Intact distal pulses.     Heart sounds: Normal heart sounds. No murmur heard.   Pulmonary:     Effort: Pulmonary  effort is normal. No respiratory distress.     Breath sounds: Normal breath sounds. No wheezing.  Musculoskeletal:        General: No edema. Normal range of motion.     Cervical back: Neck supple. Tenderness present.       Back:  Lymphadenopathy:     Cervical: No cervical adenopathy.  Skin:    General: Skin is warm and dry.     Findings: No rash.  Neurological:     Mental Status: He is alert and oriented to person, place, and time.     Coordination: Coordination normal.  Psychiatric:  Mood and Affect: Mood and affect normal.        Behavior: Behavior normal.     Results for orders placed or performed in visit on 12/03/20  Bayer DCA Hb A1c Waived  Result Value Ref Range   HB A1C (BAYER DCA - WAIVED) 7.2 (H) <7.0 %    Assessment & Plan:   Problem List Items Addressed This Visit      Cardiovascular and Mediastinum   Essential hypertension, benign   Relevant Orders   Lipid panel   TSH     Endocrine   Type 2 diabetes mellitus ([place]) - Primary   Relevant Orders   CBC with Differential/Platelet   CMP14+EGFR   Lipid panel   Bayer DCA Hb A1c Waived (Completed)   TSH   Uncontrolled type 2 diabetes mellitus with hyperglycemia, with long-term current use of insulin (HCC)   Relevant Orders   Lipid panel   TSH     Other   Pain management contract signed   Relevant Medications   HYDROcodone-acetaminophen (NORCO) 10-325 MG tablet   HYDROcodone-acetaminophen (NORCO) 10-325 MG tablet (Start on 01/30/2021)   HYDROcodone-acetaminophen (NORCO) 10-325 MG tablet (Start on 01/03/2021)   S/P BKA (below knee amputation) unilateral, left (HCC)   Relevant Medications   HYDROcodone-acetaminophen (NORCO) 10-325 MG tablet   HYDROcodone-acetaminophen (NORCO) 10-325 MG tablet (Start on 01/30/2021)   HYDROcodone-acetaminophen (NORCO) 10-325 MG tablet (Start on 01/03/2021)    Other Visit Diagnoses    Chronic foot pain, unspecified laterality       Relevant Medications    HYDROcodone-acetaminophen (NORCO) 10-325 MG tablet   HYDROcodone-acetaminophen (NORCO) 10-325 MG tablet (Start on 01/30/2021)   HYDROcodone-acetaminophen (NORCO) 10-325 MG tablet (Start on 01/03/2021)   methylPREDNISolone acetate (DEPO-MEDROL) injection 80 mg   Colon cancer screening       Relevant Orders   Ambulatory referral to Gastroenterology   Acute pain of right shoulder       Relevant Medications   methylPREDNISolone acetate (DEPO-MEDROL) injection 80 mg      Continue current medication.  No changes. Follow up plan: Return in 3 months (on 03/03/2021), or if symptoms worsen or fail to improve.  Counseling provided for all of the vaccine components Orders Placed This Encounter  Procedures  . CBC with Differential/Platelet  . CMP14+EGFR  . Lipid panel  . Bayer DCA Hb A1c Waived  . TSH    Caryl Pina, MD [facility] 12/03/2020, 11:37 AM

## 2020-12-04 LAB — CMP14+EGFR
ALT: 37 IU/L (ref 0–44)
AST: 20 IU/L (ref 0–40)
Albumin/Globulin Ratio: 1.6 (ref 1.2–2.2)
Albumin: 4.2 g/dL (ref 3.8–4.9)
Alkaline Phosphatase: 142 IU/L — ABNORMAL HIGH (ref 44–121)
BUN/Creatinine Ratio: 16 (ref 9–20)
BUN: 16 mg/dL (ref 6–24)
Bilirubin Total: 0.5 mg/dL (ref 0.0–1.2)
CO2: 26 mmol/L (ref 20–29)
Calcium: 9.2 mg/dL (ref 8.7–10.2)
Chloride: 98 mmol/L (ref 96–106)
Creatinine, Ser: 0.98 mg/dL (ref 0.76–1.27)
GFR calc Af Amer: 99 mL/min/{1.73_m2} (ref >59)
GFR calc non Af Amer: 86 mL/min/{1.73_m2} (ref >59)
Globulin, Total: 2.6 g/dL (ref 1.5–4.5)
Glucose: 258 mg/dL — ABNORMAL HIGH (ref 65–99)
Potassium: 5.2 mmol/L (ref 3.5–5.2)
Sodium: 138 mmol/L (ref 134–144)
Total Protein: 6.8 g/dL (ref 6.0–8.5)

## 2020-12-04 LAB — LIPID PANEL
Chol/HDL Ratio: 2.5 ratio (ref 0.0–5.0)
Cholesterol, Total: 84 mg/dL — ABNORMAL LOW (ref 100–199)
HDL: 33 mg/dL — ABNORMAL LOW (ref >39)
LDL Chol Calc (NIH): 20 mg/dL (ref 0–99)
Triglycerides: 199 mg/dL — ABNORMAL HIGH (ref 0–149)
VLDL Cholesterol Cal: 31 mg/dL (ref 5–40)

## 2020-12-04 LAB — CBC WITH DIFFERENTIAL/PLATELET
Basophils Absolute: 0.1 10*3/uL (ref 0.0–0.2)
Basos: 1 %
EOS (ABSOLUTE): 0 10*3/uL (ref 0.0–0.4)
Eos: 0 %
Hematocrit: 48.7 % (ref 37.5–51.0)
Hemoglobin: 16.3 g/dL (ref 13.0–17.7)
Immature Grans (Abs): 0.1 10*3/uL (ref 0.0–0.1)
Immature Granulocytes: 2 %
Lymphocytes Absolute: 2.4 10*3/uL (ref 0.7–3.1)
Lymphs: 35 %
MCH: 30.9 pg (ref 26.6–33.0)
MCHC: 33.5 g/dL (ref 31.5–35.7)
MCV: 92 fL (ref 79–97)
Monocytes Absolute: 0.6 10*3/uL (ref 0.1–0.9)
Monocytes: 9 %
Neutrophils Absolute: 3.7 10*3/uL (ref 1.4–7.0)
Neutrophils: 53 %
Platelets: 219 10*3/uL (ref 150–450)
RBC: 5.27 x10E6/uL (ref 4.14–5.80)
RDW: 12.9 % (ref 11.6–15.4)
WBC: 7 10*3/uL (ref 3.4–10.8)

## 2020-12-04 LAB — TSH: TSH: 0.961 u[IU]/mL (ref 0.450–4.500)

## 2020-12-16 ENCOUNTER — Telehealth: Payer: Self-pay

## 2020-12-16 NOTE — Telephone Encounter (Signed)
Left samples of Eliquis up front (3 boxes of 7). Pt request a new prescription be sent to [facility].  Pt would like to ask Dr. Louanne Skye if he is going to have to stay on Eliquis long term? Medication is expensive. Does not want to continue if so.

## 2020-12-16 NOTE — Telephone Encounter (Signed)
Yes with somebody with A. fib typically they do stay on it long-term because they have the risk for developing a stroke because of the A. fib and we do typically keep them on it long-term unless there is a contraindication, as far as a generic for Eliquis, the only generic is warfarin which has its challenges as well because he have to come in every few weeks to get blood work, it is cheaper but the blood work tends to add up and cost money.  Unfortunately there is not a perfect option but these newer agents are really nice and that you do not have to come in every couple weeks and get blood work.  Let me know what you decide, I do know they are expensive and if you would like to transition onto warfarin then please make an appointment so we can start that transition.

## 2020-12-16 NOTE — Telephone Encounter (Signed)
Patient aware and verbalizes understanding and states that he is going to stay on the eliquis.

## 2020-12-20 ENCOUNTER — Other Ambulatory Visit: Payer: Self-pay | Admitting: Family Medicine

## 2020-12-20 DIAGNOSIS — E1142 Type 2 diabetes mellitus with diabetic polyneuropathy: Secondary | ICD-10-CM

## 2020-12-23 ENCOUNTER — Encounter: Payer: Self-pay | Admitting: Cardiology

## 2020-12-23 ENCOUNTER — Other Ambulatory Visit: Payer: Self-pay

## 2020-12-23 ENCOUNTER — Ambulatory Visit: Payer: Medicare Other | Admitting: Cardiology

## 2020-12-23 VITALS — BP 148/78 | HR 74 | Ht 72.0 in | Wt 211.0 lb

## 2020-12-23 DIAGNOSIS — I480 Paroxysmal atrial fibrillation: Secondary | ICD-10-CM

## 2020-12-23 DIAGNOSIS — E782 Mixed hyperlipidemia: Secondary | ICD-10-CM

## 2020-12-23 NOTE — Progress Notes (Signed)
Cardiology Office Note  Date: 12/23/2020   ID: [name], DOB Apr 29, 1966, MRN [id number]  PCP:  Dettinger, Elige Radon, MD  Cardiologist:  Nona Dell, MD Electrophysiologist:  None   Chief Complaint  Patient presents with  . Cardiac follow-up    History of Present Illness: [name] is a 55 y.o. male last seen in August 2021 by Ms. Geni Bers PA-C.  He presents for a routine visit.  Reports no sense of palpitations, no exertional chest pain.  I reviewed his medications which are outlined below.  He continues on Toprol-XL along with Eliquis.  Lab work from January as noted below.  He does not describe any spontaneous bleeding problems.  Past Medical History:  Diagnosis Date  . Charcot ankle   . Essential hypertension   . Osteomyelitis of toe of right foot (HCC)   . Paroxysmal atrial fibrillation (HCC)   . Recurrent boils   . Type 2 diabetes mellitus with diabetic neuropathy [facility])    Diagnosed age 62    Past Surgical History:  Procedure Laterality Date  . AMPUTATION Right 06/17/2014   Procedure: PARTIAL AMPUTATION RIGHT 3RD TOE;  Surgeon: [name], DPM;  Location: [facility];  Service: Podiatry;  Laterality: Right;  . AMPUTATION Right 09/02/2014   Procedure: PARTIAL AMPUTATION 2ND TOE RIGHT FOOT;  Surgeon: [name], DPM;  Location: [facility];  Service: Podiatry;  Laterality: Right;  . AMPUTATION Right 04/01/2015   Procedure: AMPUTATION DIGIT 1ST TOE RIGHT FOOT;  Surgeon: Laurell Josephs, DPM;  Location: [facility];  Service: Podiatry;  Laterality: Right;  . AMPUTATION Right 06/29/2016   pinkeye   . AMPUTATION Right 10/24/2019   Procedure: PARTIAL THIRD RAY AMPUTATION;  Surgeon: Ferman Hamming, DPM;  Location: [facility];  Service: Podiatry;  Laterality: Right;  . AMPUTATION Right 02/19/2020   Procedure: AMPUTATION BELOW KNEE; RIGHT;  Surgeon: [name], MD;  Location: [facility];  Service: General;  Laterality: Right;  . FOOT AMPUTATION Left   . FOOT  SURGERY Left 03/2017  . HERNIA REPAIR Bilateral    and 3rd hernia repair does not remember ehat side  . IRRIGATION AND DEBRIDEMENT ABSCESS Right 11/05/2019   Procedure: IRRIGATION AND DRAINAGE ABSCESS RIGHT ANKLE;  Surgeon: Ferman Hamming, DPM;  Location: [facility];  Service: Podiatry;  Laterality: Right;  . TEE WITHOUT CARDIOVERSION N/A 10/29/2019   Procedure: TRANSESOPHAGEAL ECHOCARDIOGRAM (TEE) WITH PROPOFOL;  Surgeon: Pricilla Riffle, MD;  Location: [facility];  Service: Cardiovascular;  Laterality: N/A;    Current Outpatient Medications  Medication Sig Dispense Refill  . albuterol (PROVENTIL HFA;VENTOLIN HFA) 108 (90 Base) MCG/ACT inhaler Inhale 2 puffs into the lungs every 6 (six) hours as needed for wheezing or shortness of breath. 1 Inhaler 0  . apixaban (ELIQUIS) 5 MG TABS tablet Take 1 tablet (5 mg total) by mouth 2 (two) times daily. 60 tablet 11  . atorvastatin (LIPITOR) 20 MG tablet Take 1 tablet (20 mg total) by mouth daily. 90 tablet 3  . Continuous Blood Gluc Receiver (FREESTYLE LIBRE 2 READER) DEVI Use as directed to test blood sugar up to 6 times daily as directed. DX: E 11.9 1 each 0  . Continuous Blood Gluc Sensor (FREESTYLE LIBRE 14 DAY SENSOR) MISC Use as directed to test blood sugar up to 6 times daily as directed. DX: E 11.9 6 each 3  . DULoxetine (CYMBALTA) 60 MG capsule Take 1 capsule (60 mg total) by mouth daily. 90 capsule 3  .  fluticasone (FLONASE) 50 MCG/ACT nasal spray Place 1 spray into both nostrils 2 (two) times daily as needed for allergies or rhinitis. 16 g 6  . gabapentin (NEURONTIN) 600 MG tablet TAKE 1 TABLET BY MOUTH THREE TIMES A DAY 90 tablet 2  . HYDROcodone-acetaminophen (NORCO) 10-325 MG tablet Take 1 tablet by mouth 2 (two) times daily as needed. 60 tablet 0  . [START ON 01/30/2021] HYDROcodone-acetaminophen (NORCO) 10-325 MG tablet Take 1 tablet by mouth 2 (two) times daily as needed. 60 tablet 0  . [START ON 01/03/2021] HYDROcodone-acetaminophen  (NORCO) 10-325 MG tablet Take 1 tablet by mouth 2 (two) times daily as needed. 60 tablet 0  . insulin glargine, 2 Unit Dial, (TOUJEO MAX SOLOSTAR) 300 UNIT/ML Solostar Pen Inject 50-70 Units into the skin daily. 30 mL 3  . metFORMIN (GLUCOPHAGE) 1000 MG tablet Take 1 tablet (1,000 mg total) by mouth 2 (two) times daily. 180 tablet 3  . metoprolol succinate (TOPROL XL) 25 MG 24 hr tablet Take 1 tablet (25 mg total) by mouth daily. 90 tablet 3  . minocycline (MINOCIN) 100 MG capsule Take 1 capsule (100 mg total) by mouth 2 (two) times daily. Take on an empty stomach 20 capsule 0  . Multiple Vitamins-Minerals (MULTIVITAMIN WITH MINERALS) tablet Take 1 tablet by mouth daily.    . Multiple Vitamins-Minerals (THERA-M) TABS Take by mouth.    . Semaglutide,0.25 or 0.5MG /DOS, (OZEMPIC, 0.25 OR 0.5 MG/DOSE,) 2 MG/1.5ML SOPN Inject 0.375 mLs (0.5 mg total) into the skin once a week. 2 mL 11   No current facility-administered medications for this visit.   Allergies:  Patient has no known allergies.   ROS: No palpitations or syncope.  Physical Exam: VS:  BP (!) 148/78   Pulse 74   Ht 6' (1.829 m)   Wt 211 lb (95.7 kg)   SpO2 97%   BMI 28.62 kg/m , BMI Body mass index is 28.62 kg/m.  Wt Readings from Last 3 Encounters:  12/23/20 211 lb (95.7 kg)  12/03/20 211 lb (95.7 kg)  11/29/20 209 lb 9.6 oz (95.1 kg)    General: Patient appears comfortable at rest. HEENT: Conjunctiva and lids normal, wearing a mask. Neck: Supple, no elevated JVP or carotid bruits, no thyromegaly. Lungs: Clear to auscultation, nonlabored breathing at rest. Cardiac: Regular rate and rhythm, no S3 or significant systolic murmur, no pericardial rub. Extremities: Status post right BKA.  ECG:  An ECG dated 03/29/2020 was personally reviewed today and demonstrated:  Sinus rhythm with poor R wave progression.  Recent Labwork: 02/23/2020: Magnesium 1.8 12/03/2020: ALT 37; AST 20; BUN 16; Creatinine, Ser 0.98; Hemoglobin 16.3;  Platelets 219; Potassium 5.2; Sodium 138; TSH 0.961     Component Value Date/Time   CHOL 84 (L) 12/03/2020 1118   TRIG 199 (H) 12/03/2020 1118   HDL 33 (L) 12/03/2020 1118   CHOLHDL 2.5 12/03/2020 1118   CHOLHDL 4.6 01/30/2018 1347   VLDL 54 (H) 01/30/2018 1347   LDLCALC 20 12/03/2020 1118    Other Studies Reviewed Today:  TEE 10/29/2019: 1. Left ventricular ejection fraction, by visual estimation, is 60 to  65%. The left ventricle has normal function. There is no left ventricular  hypertrophy.  2. The left ventricle has no regional wall motion abnormalities.  3. Global right ventricle has normal systolic function.The right  ventricular size is normal. Right vetricular wall thickness was not  assessed.  4. Left atrial size was normal.  5. Right atrial size was normal.  6.  The mitral valve is normal in structure. No evidence of mitral valve  regurgitation.  7. The tricuspid valve is normal in structure.  8. The aortic valve is normal in structure. Aortic valve regurgitation is  not visualized.  9. The pulmonic valve was normal in structure. Pulmonic valve  regurgitation is trivial.   Assessment and Plan:  1.  Paroxysmal atrial fibrillation with CHA2DS2-VASc score of 5.  He is stable without palpitations, heart rate regular today.  Continue Toprol-XL 25 mg daily along with Eliquis for stroke prophylaxis.  I reviewed his recent lab work from January.  He does not report any spontaneous bleeding problems.  2.  Mixed hyperlipidemia, continues on Lipitor.  Last LDL down to 20.  Medication Adjustments/Labs and Tests Ordered: Current medicines are reviewed at length with the patient today.  Concerns regarding medicines are outlined above.   Tests Ordered: No orders of the defined types were placed in this encounter.   Medication Changes: No orders of the defined types were placed in this encounter.   Disposition:  Follow up 6 months in the [place]  office.  Signed, Jonelle Sidle, MD, [facility] 12/23/2020 1:09 PM    [facility] at [facility] [address] [place], [place] [address] Phone: [phone]; Fax: [phone]

## 2020-12-23 NOTE — Patient Instructions (Signed)
Medication Instructions:  °Your physician recommends that you continue on your current medications as directed. Please refer to the Current Medication list given to you today. ° °*If you need a refill on your cardiac medications before your next appointment, please call your pharmacy* ° ° °Lab Work: °None today °If you have labs (blood work) drawn today and your tests are completely normal, you will receive your results only by: °• MyChart Message (if you have MyChart) OR °• A paper copy in the mail °If you have any lab test that is abnormal or we need to change your treatment, we will call you to review the results. ° ° °Testing/Procedures: °None today ° ° °Follow-Up: °At [facility], you and your health needs are our priority.  As part of our continuing mission to provide you with exceptional heart care, we have created designated Provider Care Teams.  These Care Teams include your primary Cardiologist (physician) and Advanced Practice Providers (APPs -  Physician Assistants and Nurse Practitioners) who all work together to provide you with the care you need, when you need it. ° °We recommend signing up for the patient portal called "MyChart".  Sign up information is provided on this After Visit Summary.  MyChart is used to connect with patients for Virtual Visits (Telemedicine).  Patients are able to view lab/test results, encounter notes, upcoming appointments, etc.  Non-urgent messages can be sent to your provider as well.   °To learn more about what you can do with MyChart, go to [email or web address].   ° °Your next appointment:   °6 month(s) ° °The format for your next appointment:   °In Person ° °Provider:   °Samuel McDowell, MD ° ° °Other Instructions °None ° ° ° ° °Thank you for choosing  [facility] ! ° ° ° ° ° ° ° ° °

## 2020-12-27 ENCOUNTER — Other Ambulatory Visit: Payer: Self-pay

## 2020-12-27 ENCOUNTER — Encounter: Payer: Self-pay | Admitting: Family Medicine

## 2020-12-27 ENCOUNTER — Ambulatory Visit (INDEPENDENT_AMBULATORY_CARE_PROVIDER_SITE_OTHER): Payer: Medicare Other | Admitting: Family Medicine

## 2020-12-27 VITALS — BP 122/73 | HR 75 | Ht 72.0 in | Wt 211.0 lb

## 2020-12-27 DIAGNOSIS — L0211 Cutaneous abscess of neck: Secondary | ICD-10-CM

## 2020-12-27 DIAGNOSIS — L03221 Cellulitis of neck: Secondary | ICD-10-CM

## 2020-12-27 MED ORDER — SULFAMETHOXAZOLE-TRIMETHOPRIM 800-160 MG PO TABS: 1.0000 | ORAL_TABLET | Freq: Two times a day (BID) | ORAL | 0 refills | Status: DC

## 2020-12-27 NOTE — Addendum Note (Signed)
Addended by: Dorene Sorrow on: 12/27/2020 04:22 PM   Modules accepted: Orders

## 2020-12-27 NOTE — Progress Notes (Signed)
   BP 122/73   Pulse 75   Ht 6' (1.829 m)   Wt 211 lb (95.7 kg)   SpO2 99%   BMI 28.62 kg/m    Subjective:   Patient ID: [name], male    DOB: 1966/04/23, 55 y.o.   MRN: [id number]  HPI: [name] is a 55 y.o. male presenting on 12/27/2020 for Cyst (Posterior neck)   HPI Patient is coming in for 2 cyst on his posterior neck and been bothering him over the past week.  He says the top one came at first but has drained some more than the bottom one is hurting a lot and has not drained.  He says is red and warm and he thinks he got purulent drainage out of the top 1.  He denies any fevers or chills or body aches.  He does have diabetes but his blood sugars have been staying under control recently.  Relevant past medical, surgical, family and social history reviewed and updated as indicated. Interim medical history since our last visit reviewed. Allergies and medications reviewed and updated.  Review of Systems  Constitutional: Negative for chills and fever.  Respiratory: Negative for shortness of breath and wheezing.   Cardiovascular: Negative for chest pain and leg swelling.  Skin: Positive for color change and rash.  All other systems reviewed and are negative.   Per HPI unless specifically indicated above      Objective:   BP 122/73   Pulse 75   Ht 6' (1.829 m)   Wt 211 lb (95.7 kg)   SpO2 99%   BMI 28.62 kg/m   Wt Readings from Last 3 Encounters:  12/27/20 211 lb (95.7 kg)  12/23/20 211 lb (95.7 kg)  12/03/20 211 lb (95.7 kg)    Physical Exam Vitals and nursing note reviewed.  Constitutional:      Appearance: Normal appearance.  Skin:    Findings: Abscess and erythema present.          Comments: The lower abscess is slightly larger and is about 1 cm in diameter with erythema and a small amount of fluctuation and induration.  No purulent drainage is able to be expressed.  Neurological:     Mental Status: He is alert.     I&D: Region was  anesthetized with topical ethyl chloride. Incision was made on anterior medial aspect of the open wound.  Small amount serosanguineous and purulent drainage was exuded. Culture was taken.  Simple dressing was placed over top. Bleeding was minimal and patient tolerated procedure well.    Assessment & Plan:   Problem List Items Addressed This Visit   None   Visit Diagnoses    Cellulitis and abscess of neck    -  Primary   Relevant Medications   sulfamethoxazole-trimethoprim (BACTRIM DS) 800-160 MG tablet       Follow up plan: Return if symptoms worsen or fail to improve.  Counseling provided for all of the vaccine components No orders of the defined types were placed in this encounter.   Arville Care, MD [facility] 12/27/2020, 4:08 PM

## 2020-12-28 ENCOUNTER — Encounter: Payer: Self-pay | Admitting: Gastroenterology

## 2020-12-30 DIAGNOSIS — Z794 Long term (current) use of insulin: Secondary | ICD-10-CM | POA: Diagnosis not present

## 2020-12-30 DIAGNOSIS — E1165 Type 2 diabetes mellitus with hyperglycemia: Secondary | ICD-10-CM | POA: Diagnosis not present

## 2020-12-31 LAB — ANAEROBIC AND AEROBIC CULTURE

## 2021-01-03 ENCOUNTER — Telehealth: Payer: Self-pay

## 2021-01-03 NOTE — Telephone Encounter (Signed)
LM for patient to call back to the office- PV and procedure appt has been cancelled as patient needs to be scheduled for an OV due to being on Eliquis- procedure appt still in Epic as patient should be seen prior to the procedure and can stay on schedule for procedure as Eliquis clearance will need to be obtained;

## 2021-01-07 NOTE — Telephone Encounter (Signed)
Patient has been scheduled for an OV with provider due to blood thinner;

## 2021-01-30 DIAGNOSIS — Z794 Long term (current) use of insulin: Secondary | ICD-10-CM | POA: Diagnosis not present

## 2021-01-30 DIAGNOSIS — E1165 Type 2 diabetes mellitus with hyperglycemia: Secondary | ICD-10-CM | POA: Diagnosis not present

## 2021-02-08 ENCOUNTER — Encounter: Payer: Medicaid Other | Admitting: Gastroenterology

## 2021-02-21 ENCOUNTER — Ambulatory Visit: Payer: Medicaid Other | Admitting: Internal Medicine

## 2021-03-03 ENCOUNTER — Other Ambulatory Visit: Payer: Self-pay

## 2021-03-03 ENCOUNTER — Encounter: Payer: Self-pay | Admitting: Family Medicine

## 2021-03-03 ENCOUNTER — Ambulatory Visit (INDEPENDENT_AMBULATORY_CARE_PROVIDER_SITE_OTHER): Payer: Medicare Other | Admitting: Family Medicine

## 2021-03-03 VITALS — BP 128/74 | HR 72

## 2021-03-03 DIAGNOSIS — G8929 Other chronic pain: Secondary | ICD-10-CM

## 2021-03-03 DIAGNOSIS — M79673 Pain in unspecified foot: Secondary | ICD-10-CM

## 2021-03-03 DIAGNOSIS — I10 Essential (primary) hypertension: Secondary | ICD-10-CM

## 2021-03-03 DIAGNOSIS — E1142 Type 2 diabetes mellitus with diabetic polyneuropathy: Secondary | ICD-10-CM | POA: Diagnosis not present

## 2021-03-03 DIAGNOSIS — Z89511 Acquired absence of right leg below knee: Secondary | ICD-10-CM | POA: Diagnosis not present

## 2021-03-03 DIAGNOSIS — E1165 Type 2 diabetes mellitus with hyperglycemia: Secondary | ICD-10-CM | POA: Diagnosis not present

## 2021-03-03 DIAGNOSIS — Z794 Long term (current) use of insulin: Secondary | ICD-10-CM | POA: Diagnosis not present

## 2021-03-03 DIAGNOSIS — Z7689 Persons encountering health services in other specified circumstances: Secondary | ICD-10-CM | POA: Diagnosis not present

## 2021-03-03 DIAGNOSIS — Z89512 Acquired absence of left leg below knee: Secondary | ICD-10-CM

## 2021-03-03 DIAGNOSIS — I4811 Longstanding persistent atrial fibrillation: Secondary | ICD-10-CM | POA: Diagnosis not present

## 2021-03-03 DIAGNOSIS — Z0289 Encounter for other administrative examinations: Secondary | ICD-10-CM | POA: Diagnosis not present

## 2021-03-03 DIAGNOSIS — Z79891 Long term (current) use of opiate analgesic: Secondary | ICD-10-CM | POA: Diagnosis not present

## 2021-03-03 LAB — BAYER DCA HB A1C WAIVED: HB A1C (BAYER DCA - WAIVED): 7.5 % — ABNORMAL HIGH (ref <7.0)

## 2021-03-03 MED ORDER — HYDROCODONE-ACETAMINOPHEN 10-325 MG PO TABS: 1.0000 | ORAL_TABLET | Freq: Two times a day (BID) | ORAL | 0 refills | Status: DC | PRN

## 2021-03-03 MED ORDER — OZEMPIC (0.25 OR 0.5 MG/DOSE) 2 MG/1.5ML ~~LOC~~ SOPN: 0.5000 mg | PEN_INJECTOR | SUBCUTANEOUS | 11 refills | Status: DC

## 2021-03-03 NOTE — Progress Notes (Signed)
BP 128/74   Pulse 72   SpO2 100%    Subjective:   Patient ID: [name], male    DOB: 1966/02/23, 55 y.o.   MRN: [id number]  HPI: [name] is a 55 y.o. male presenting on 03/03/2021 for Medical Management of Chronic Issues and Diabetes   HPI Type 2 diabetes mellitus Patient comes in today for recheck of his diabetes. Patient has been currently taking metformin and Ozempic angiogram. Patient is currently on an ACE inhibitor/ARB because of low blood pressure. Patient has not seen an ophthalmologist this year. Patient denies any issues with their feet. The symptom started onset as an adult hypertension hyperlipidemia ARE RELATED TO DM   Hypertension Patient is currently on metoprolol, and their blood pressure today is 128/74. Patient denies any lightheadedness or dizziness. Patient denies headaches, blurred vision, chest pains, shortness of breath, or weakness. Denies any side effects from medication and is content with current medication.   Hyperlipidemia Patient is coming in for recheck of his hyperlipidemia. The patient is currently taking atorvastatin. They deny any issues with myalgias or history of liver damage from it. They deny any focal numbness or weakness or chest pain.   Pain assessment: Cause of pain-bilateral lower legs, status post amputations Pain location-both lower legs Pain on scale of 1-10- 3 Frequency-Daily What increases pain-walking with prosthetics What makes pain Better-resting and hydrocodone Effects on ADL -patient is alert and feels rested better and becoming more more self-sufficient with ADLs Any change in general medical condition-none  Current opioids rx-hydrocodone 10-3 25 twice daily as needed # meds rx-60 Effectiveness of current meds-works well Adverse reactions from pain meds-none Morphine equivalent-20  Pill count performed-No Last drug screen -04/27/2020 ( high risk q32m, moderate risk q28m, low risk yearly ) Urine drug screen  today- Yes Was the NCCSR reviewed-yes  If yes were their any concerning findings? -None  No flowsheet data found.   Pain contract signed on: Today  Relevant past medical, surgical, family and social history reviewed and updated as indicated. Interim medical history since our last visit reviewed. Allergies and medications reviewed and updated.  Review of Systems  Constitutional: Negative for chills and fever.  Eyes: Negative for visual disturbance.  Respiratory: Negative for shortness of breath and wheezing.   Cardiovascular: Negative for chest pain and leg swelling.  Musculoskeletal: Positive for arthralgias and myalgias. Negative for back pain.  Skin: Negative for rash.  Neurological: Negative for dizziness, weakness and light-headedness.  All other systems reviewed and are negative.   Per HPI unless specifically indicated above   Allergies as of 03/03/2021   No Known Allergies     Medication List       Accurate as of March 03, 2021  8:40 AM. If you have any questions, ask your nurse or doctor.        albuterol 108 (90 Base) MCG/ACT inhaler Commonly known as: VENTOLIN HFA Inhale 2 puffs into the lungs every 6 (six) hours as needed for wheezing or shortness of breath.   apixaban 5 MG Tabs tablet Commonly known as: ELIQUIS Take 1 tablet (5 mg total) by mouth 2 (two) times daily.   atorvastatin 20 MG tablet Commonly known as: LIPITOR Take 1 tablet (20 mg total) by mouth daily.   DULoxetine 60 MG capsule Commonly known as: Cymbalta Take 1 capsule (60 mg total) by mouth daily.   fluticasone 50 MCG/ACT nasal spray Commonly known as: FLONASE Place 1 spray into both nostrils 2 (two)  times daily as needed for allergies or rhinitis.   FreeStyle Libre 14 Day Sensor Misc Use as directed to test blood sugar up to 6 times daily as directed. DX: E 11.9   FreeStyle Libre 2 Reader [facility] Use as directed to test blood sugar up to 6 times daily as directed. DX: E 11.9    gabapentin 600 MG tablet Commonly known as: NEURONTIN TAKE 1 TABLET BY MOUTH THREE TIMES A DAY   HYDROcodone-acetaminophen 10-325 MG tablet Commonly known as: NORCO Take 1 tablet by mouth 2 (two) times daily as needed.   HYDROcodone-acetaminophen 10-325 MG tablet Commonly known as: NORCO Take 1 tablet by mouth 2 (two) times daily as needed.   HYDROcodone-acetaminophen 10-325 MG tablet Commonly known as: NORCO Take 1 tablet by mouth 2 (two) times daily as needed.   metFORMIN 1000 MG tablet Commonly known as: GLUCOPHAGE Take 1 tablet (1,000 mg total) by mouth 2 (two) times daily.   metoprolol succinate 25 MG 24 hr tablet Commonly known as: Toprol XL Take 1 tablet (25 mg total) by mouth daily.   minocycline 100 MG capsule Commonly known as: Minocin Take 1 capsule (100 mg total) by mouth 2 (two) times daily. Take on an empty stomach   multivitamin with minerals tablet Take 1 tablet by mouth daily.   Thera-M Tabs Take by mouth.   Ozempic (0.25 or 0.5 MG/DOSE) 2 MG/1.5ML Sopn Generic drug: Semaglutide(0.25 or 0.5MG /DOS) Inject 0.375 mLs (0.5 mg total) into the skin once a week.   sulfamethoxazole-trimethoprim 800-160 MG tablet Commonly known as: BACTRIM DS Take 1 tablet by mouth 2 (two) times daily.   Toujeo Max SoloStar 300 UNIT/ML Solostar Pen Generic drug: insulin glargine (2 Unit Dial) Inject 50-70 Units into the skin daily.        Objective:   BP 128/74   Pulse 72   SpO2 100%   Wt Readings from Last 3 Encounters:  12/27/20 211 lb (95.7 kg)  12/23/20 211 lb (95.7 kg)  12/03/20 211 lb (95.7 kg)    Physical Exam Vitals and nursing note reviewed.  Constitutional:      General: He is not in acute distress.    Appearance: He is well-developed. He is not diaphoretic.  Eyes:     General: No scleral icterus.    Conjunctiva/sclera: Conjunctivae normal.  Neck:     Thyroid: No thyromegaly.  Cardiovascular:     Rate and Rhythm: Normal rate and regular  rhythm.     Heart sounds: Normal heart sounds. No murmur heard.   Pulmonary:     Effort: Pulmonary effort is normal. No respiratory distress.     Breath sounds: Normal breath sounds. No wheezing.  Musculoskeletal:     Cervical back: Neck supple.     Comments: Bilateral BKA with prosthetic legs  Lymphadenopathy:     Cervical: No cervical adenopathy.  Skin:    General: Skin is warm and dry.     Findings: No rash.  Neurological:     Mental Status: He is alert and oriented to person, place, and time.     Coordination: Coordination normal.  Psychiatric:        Behavior: Behavior normal.       Assessment & Plan:   Problem List Items Addressed This Visit      Cardiovascular and Mediastinum   Essential hypertension, benign   A-fib (HCC)     Endocrine   Type 2 diabetes mellitus (HCC) - Primary   Relevant Medications   Semaglutide,0.25  or 0.5MG /DOS, (OZEMPIC, 0.25 OR 0.5 MG/DOSE,) 2 MG/1.5ML SOPN   Other Relevant Orders   Bayer DCA Hb A1c Waived     Other   Pain management contract signed   Relevant Medications   HYDROcodone-acetaminophen (NORCO) 10-325 MG tablet   HYDROcodone-acetaminophen (NORCO) 10-325 MG tablet (Start on 05/02/2021)   HYDROcodone-acetaminophen (NORCO) 10-325 MG tablet (Start on 04/01/2021)   S/P BKA (below knee amputation) unilateral, left (HCC)   Relevant Medications   HYDROcodone-acetaminophen (NORCO) 10-325 MG tablet   HYDROcodone-acetaminophen (NORCO) 10-325 MG tablet (Start on 05/02/2021)   HYDROcodone-acetaminophen (NORCO) 10-325 MG tablet (Start on 04/01/2021)   Other Relevant Orders   ToxASSURE Select 13 (MW), Urine   S/P BKA (below knee amputation) unilateral, right (HCC)   Relevant Orders   ToxASSURE Select 13 (MW), Urine    Other Visit Diagnoses    Chronic foot pain, unspecified laterality       Relevant Medications   HYDROcodone-acetaminophen (NORCO) 10-325 MG tablet   HYDROcodone-acetaminophen (NORCO) 10-325 MG tablet (Start on  05/02/2021)   HYDROcodone-acetaminophen (NORCO) 10-325 MG tablet (Start on 04/01/2021)      A1c is up slightly at 7.5.  Patient has only been taking 1.25 of Ozempic so we will increase his dose to 0.5  Continue hydrocodone.  We will leave urine today. Follow up plan: Return in about 3 months (around 06/02/2021), or if symptoms worsen or fail to improve, for Diabetes recheck.  Counseling provided for all of the vaccine components Orders Placed This Encounter  Procedures  . Bayer [facility] Hb A1c Waived    Arville Care, MD [facility] 03/03/2021, 8:40 AM

## 2021-03-14 LAB — TOXASSURE SELECT 13 (MW), URINE

## 2021-03-29 ENCOUNTER — Telehealth: Payer: Self-pay | Admitting: Family Medicine

## 2021-03-29 NOTE — Telephone Encounter (Signed)
Called pt appt made dettinger had opening for 05/26.

## 2021-03-29 NOTE — Telephone Encounter (Signed)
Pt called and said that he just had an appt with his prosthetic leg doctor and was told that he needed to set up an appt to see Dr Dettinger ASAP so that he could order is med supplies.   Please advise and call patient.

## 2021-03-31 ENCOUNTER — Ambulatory Visit (INDEPENDENT_AMBULATORY_CARE_PROVIDER_SITE_OTHER): Payer: Medicare Other | Admitting: Family Medicine

## 2021-03-31 ENCOUNTER — Encounter: Payer: Self-pay | Admitting: Family Medicine

## 2021-03-31 ENCOUNTER — Other Ambulatory Visit: Payer: Self-pay

## 2021-03-31 VITALS — BP 116/72 | HR 76 | Temp 97.9°F | Resp 20 | Ht 72.0 in | Wt 219.0 lb

## 2021-03-31 DIAGNOSIS — Z89512 Acquired absence of left leg below knee: Secondary | ICD-10-CM

## 2021-03-31 DIAGNOSIS — Z89511 Acquired absence of right leg below knee: Secondary | ICD-10-CM | POA: Diagnosis not present

## 2021-03-31 NOTE — Progress Notes (Signed)
BP 116/72   Pulse 76   Temp 97.9 F (36.6 C) (Temporal)   Resp 20   Ht 6' (1.829 m)   Wt 219 lb (99.3 kg)   SpO2 98%   BMI 29.70 kg/m    Subjective:   Patient ID: [name], male    DOB: 01-Oct-1966, 55 y.o.   MRN: [id number]  HPI: [name] is a 55 y.o. male presenting on 03/31/2021 for Face to face for prostetics   HPI Coming in today for recheck on his prosthetics.  He has bilateral BKA and prosthetics with cuts and socks that worked very well for him and he keeps very active and is able to ambulate and doing very well for him.  He just needs new liners and socks because they have worn down and he has had some of them for greater than a year and is worried that if he continues to use these he will become unstable and that he may develop sores.  He has been doing very well with both of them.  Relevant past medical, surgical, family and social history reviewed and updated as indicated. Interim medical history since our last visit reviewed. Allergies and medications reviewed and updated.  Review of Systems  Constitutional: Negative for chills and fever.  Eyes: Negative for visual disturbance.  Respiratory: Negative for shortness of breath and wheezing.   Cardiovascular: Negative for chest pain and leg swelling.  Musculoskeletal: Positive for arthralgias. Negative for back pain and gait problem.  Skin: Negative for rash.  All other systems reviewed and are negative.   Per HPI unless specifically indicated above   Allergies as of 03/31/2021   No Known Allergies     Medication List       Accurate as of Mar 31, 2021  2:54 PM. If you have any questions, ask your nurse or doctor.        STOP taking these medications   sulfamethoxazole-trimethoprim 800-160 MG tablet Commonly known as: BACTRIM DS Stopped by: Elige Radon Darnette Lampron, MD     TAKE these medications   albuterol 108 (90 Base) MCG/ACT inhaler Commonly known as: VENTOLIN HFA Inhale 2 puffs into the lungs  every 6 (six) hours as needed for wheezing or shortness of breath.   apixaban 5 MG Tabs tablet Commonly known as: ELIQUIS Take 1 tablet (5 mg total) by mouth 2 (two) times daily.   atorvastatin 20 MG tablet Commonly known as: LIPITOR Take 1 tablet (20 mg total) by mouth daily.   DULoxetine 60 MG capsule Commonly known as: Cymbalta Take 1 capsule (60 mg total) by mouth daily.   fluticasone 50 MCG/ACT nasal spray Commonly known as: FLONASE Place 1 spray into both nostrils 2 (two) times daily as needed for allergies or rhinitis.   FreeStyle Libre 14 Day Sensor Misc Use as directed to test blood sugar up to 6 times daily as directed. DX: E 11.9   FreeStyle Libre 2 Reader [facility] Use as directed to test blood sugar up to 6 times daily as directed. DX: E 11.9   gabapentin 600 MG tablet Commonly known as: NEURONTIN TAKE 1 TABLET BY MOUTH THREE TIMES A DAY   HYDROcodone-acetaminophen 10-325 MG tablet Commonly known as: NORCO Take 1 tablet by mouth 2 (two) times daily as needed.   HYDROcodone-acetaminophen 10-325 MG tablet Commonly known as: NORCO Take 1 tablet by mouth 2 (two) times daily as needed. Start taking on: Apr 01, 2021   HYDROcodone-acetaminophen 10-325 MG tablet Commonly  known as: NORCO Take 1 tablet by mouth 2 (two) times daily as needed. Start taking on: May 02, 2021   metFORMIN 1000 MG tablet Commonly known as: GLUCOPHAGE Take 1 tablet (1,000 mg total) by mouth 2 (two) times daily.   metoprolol succinate 25 MG 24 hr tablet Commonly known as: Toprol XL Take 1 tablet (25 mg total) by mouth daily.   minocycline 100 MG capsule Commonly known as: Minocin Take 1 capsule (100 mg total) by mouth 2 (two) times daily. Take on an empty stomach   multivitamin with minerals tablet Take 1 tablet by mouth daily.   Thera-M Tabs Take by mouth.   Ozempic (0.25 or 0.5 MG/DOSE) 2 MG/1.5ML Sopn Generic drug: Semaglutide(0.25 or 0.5MG /DOS) Inject 0.5 mg into the skin once  a week.   Toujeo Max SoloStar 300 UNIT/ML Solostar Pen Generic drug: insulin glargine (2 Unit Dial) Inject 50-70 Units into the skin daily.        Objective:   BP 116/72   Pulse 76   Temp 97.9 F (36.6 C) (Temporal)   Resp 20   Ht 6' (1.829 m)   Wt 219 lb (99.3 kg)   SpO2 98%   BMI 29.70 kg/m   Wt Readings from Last 3 Encounters:  03/31/21 219 lb (99.3 kg)  12/27/20 211 lb (95.7 kg)  12/23/20 211 lb (95.7 kg)    Physical Exam Vitals and nursing note reviewed.  Constitutional:      General: He is not in acute distress.    Appearance: He is well-developed. He is not diaphoretic.  Eyes:     General: No scleral icterus.    Conjunctiva/sclera: Conjunctivae normal.  Neck:     Thyroid: No thyromegaly.  Musculoskeletal:        General: Normal range of motion.  Skin:    General: Skin is warm and dry.     Findings: No rash.  Neurological:     Mental Status: He is alert and oriented to person, place, and time.     Coordination: Coordination normal.  Psychiatric:        Behavior: Behavior normal.       Assessment & Plan:   Problem List Items Addressed This Visit      Other   S/P BKA (below knee amputation) unilateral, left (HCC)   Relevant Orders   For home use only DME Other see comment   S/P BKA (below knee amputation) unilateral, right (HCC) - Primary   Relevant Orders   For home use only DME Other see comment      Prescription for prosthetic.  Patient's socks are wearing down and he does not want to develop any sores.  He is very mobile and very active with the prosthetics Follow up plan: Return if symptoms worsen or fail to improve.  Counseling provided for all of the vaccine components No orders of the defined types were placed in this encounter.   Arville Care, MD [facility] 03/31/2021, 2:54 PM

## 2021-04-01 ENCOUNTER — Other Ambulatory Visit: Payer: Self-pay | Admitting: Cardiology

## 2021-04-03 DIAGNOSIS — Z794 Long term (current) use of insulin: Secondary | ICD-10-CM | POA: Diagnosis not present

## 2021-04-03 DIAGNOSIS — E1165 Type 2 diabetes mellitus with hyperglycemia: Secondary | ICD-10-CM | POA: Diagnosis not present

## 2021-04-06 ENCOUNTER — Telehealth: Payer: Self-pay | Admitting: Family Medicine

## 2021-04-06 NOTE — Telephone Encounter (Signed)
Pt wants to know if we can give him anymore samples of Eliquis?  Please call patient.

## 2021-04-06 NOTE — Telephone Encounter (Signed)
Samples upfront, pt aware 

## 2021-04-12 ENCOUNTER — Other Ambulatory Visit: Payer: Self-pay | Admitting: Cardiology

## 2021-04-15 ENCOUNTER — Telehealth: Payer: Self-pay | Admitting: Cardiology

## 2021-04-15 ENCOUNTER — Ambulatory Visit (INDEPENDENT_AMBULATORY_CARE_PROVIDER_SITE_OTHER): Payer: Medicare Other | Admitting: Pharmacist

## 2021-04-15 ENCOUNTER — Other Ambulatory Visit: Payer: Self-pay

## 2021-04-15 DIAGNOSIS — E1142 Type 2 diabetes mellitus with diabetic polyneuropathy: Secondary | ICD-10-CM | POA: Diagnosis not present

## 2021-04-15 DIAGNOSIS — Z794 Long term (current) use of insulin: Secondary | ICD-10-CM

## 2021-04-15 MED ORDER — ATORVASTATIN CALCIUM 20 MG PO TABS: 20.0000 mg | ORAL_TABLET | Freq: Every day | ORAL | 3 refills | Status: DC

## 2021-04-15 MED ORDER — AMIODARONE HCL 200 MG PO TABS: 200.0000 mg | ORAL_TABLET | Freq: Every day | ORAL | 3 refills | Status: DC

## 2021-04-15 NOTE — Telephone Encounter (Signed)
Medication refill request approved and sent to [facility] in [place] per pt request.

## 2021-04-15 NOTE — Telephone Encounter (Signed)
New message     *STAT* If patient is at the pharmacy, call can be transferred to refill team.   1. Which medications need to be refilled? (please list name of each medication and dose if known) amiodarone (PACERONE) 200 MG tablet  2. Which pharmacy/location (including street and city if local pharmacy) is medication to be sent to? [facility] [place]   3. Do they need a 30 day or 90 day supply 90

## 2021-04-15 NOTE — Progress Notes (Signed)
    04/15/2021 Name: [name] MRN: [id number] DOB: [birth year]   S:  7 yoM Presents for diabetes evaluation, education, and management.  Patient is s/p bilateral amputations.  Patient was referred and last seen by Primary Care Provider on 03/03/21.  Insurance coverage/medication affordability: [employer] medicare  Patient reports adherence with medications. Current diabetes medications include: tresiba, ozempic, metformin Current hypertension medications include: metop Goal 130/80 Current hyperlipidemia medications include: atorvastatin   Patient denies hypoglycemic events.   Patient reported dietary habits: Eats 3 meals/day  Discussed meal planning options and Plate method for healthy eating Avoid sugary drinks and desserts Incorporate balanced protein, non starchy veggies, 1 serving of carbohydrate with each meal Increase water intake Increase physical activity as able   O:  Lab Results  Component Value Date   HGBA1C 7.5 (H) 03/03/2021    There were no vitals filed for this visit.     Lipid Panel     Component Value Date/Time   CHOL 84 (L) 12/03/2020 1118   TRIG 199 (H) 12/03/2020 1118   HDL 33 (L) 12/03/2020 1118   CHOLHDL 2.5 12/03/2020 1118   CHOLHDL 4.6 01/30/2018 1347   VLDL 54 (H) 01/30/2018 1347   LDLCALC 20 12/03/2020 1118     Home fasting blood sugars: <150  2 hour post-meal/random blood sugars: <200.    Clinical Atherosclerotic Cardiovascular Disease (ASCVD): No   The ASCVD Risk score Denman George DC Jr., et al., 2013) failed to calculate for the following reasons:   The valid total cholesterol range is 130 to 320 mg/dL    A/P:  Diabetes B7JI currently uncontrolled, A1c 7.5%. Patient is able to verbalize appropriate hypoglycemia management plan. Patient is adherent with medication. Control is suboptimal due to diet.   -Switching to tresiba for ease of patient assistance Up to 75 units daily to allow for extra--takes in the AM; usually injecting 68  units daily on average Refills sent to [facility] patient assistance for tresiba/ozempic/pen needles   -Now on ozempic 0.5mg  sq weekly, BGs up a bit  Discussed increasing to 1mg  if needed  Denies personal and family history of Medullary thyroid cancer (MTC)  -Refill provided for atorvastatin  -Extensively discussed pathophysiology of diabetes, recommended lifestyle interventions, dietary effects on blood sugar control  -Counseled on s/sx of and management of hypoglycemia   Written patient instructions provided.  Total time in face to face counseling 25 minutes.     , PharmD, BCPS Clinical Pharmacist, [facility]  II Phone [phone]

## 2021-04-18 ENCOUNTER — Encounter: Payer: Self-pay | Admitting: Internal Medicine

## 2021-04-18 ENCOUNTER — Telehealth: Payer: Self-pay

## 2021-04-18 ENCOUNTER — Ambulatory Visit: Payer: Medicare Other | Admitting: Internal Medicine

## 2021-04-18 VITALS — BP 156/88 | HR 84 | Ht 74.0 in | Wt 217.0 lb

## 2021-04-18 DIAGNOSIS — Z7901 Long term (current) use of anticoagulants: Secondary | ICD-10-CM | POA: Diagnosis not present

## 2021-04-18 DIAGNOSIS — Z1211 Encounter for screening for malignant neoplasm of colon: Secondary | ICD-10-CM

## 2021-04-18 DIAGNOSIS — E1159 Type 2 diabetes mellitus with other circulatory complications: Secondary | ICD-10-CM

## 2021-04-18 MED ORDER — SUTAB 1479-225-188 MG PO TABS: 1.0000 | ORAL_TABLET | Freq: Once | ORAL | 0 refills | Status: AC

## 2021-04-18 NOTE — Patient Instructions (Signed)
If you are age 57 or older, your body mass index should be between 23-30. Your Body mass index is 27.86 kg/m?. If this is out of the aforementioned range listed, please consider follow up with your Primary Care Provider. ? ?If you are age 64 or younger, your body mass index should be between 19-25. Your Body mass index is 27.86 kg/m?. If this is out of the aformentioned range listed, please consider follow up with your Primary Care Provider.  ? ?________________________________________________________ ? ?The [facility] providers would like to encourage you to use MYCHART to communicate with providers for non-urgent requests or questions.  Due to long hold times on the telephone, sending your provider a message by MYCHART may be a faster and more efficient way to get a response.  Please allow 48 business hours for a response.  Please remember that this is for non-urgent requests.  ?_______________________________________________________ ? ?You have been scheduled for a colonoscopy. Please follow written instructions given to you at your visit today.  ?Please pick up your prep supplies at the pharmacy within the next 1-3 days. ?If you use inhalers (even only as needed), please bring them with you on the day of your procedure. ? ?

## 2021-04-18 NOTE — Telephone Encounter (Signed)
    [name] DOB:  07-11-1966  MRN:  [id number]   Primary Cardiologist: Nona Dell, MD  Chart reviewed as part of pre-operative protocol coverage. Given past medical history and time since last visit, based on ACC/AHA guidelines, [name] would be at acceptable risk for the planned procedure without further cardiovascular testing.   Patient with diagnosis of afib on Eliquis for anticoagulation.     Procedure: Colonoscopy Date of procedure: 08/02/21   CHA2DS2-VASc Score = 3  This indicates a 3.2% annual risk of stroke. The patient's score is based upon: CHF History: No HTN History: Yes Diabetes History: Yes Stroke History: No Vascular Disease History: Yes Age Score: 0 Gender Score: 0    CrCl 97.8 ml/min Platelet count 219   Of note patient has a hx of a single DVT   Per office protocol, patient can hold Eliquis for 2 days prior to procedure.    I will route this recommendation to the requesting party via Epic fax function and remove from pre-op pool.  Please call with questions.  Georgie Chard, NP 04/18/2021, 4:26 PM

## 2021-04-18 NOTE — Progress Notes (Signed)
HISTORY OF PRESENT ILLNESS:  [name] is a pleasant 55 y.o. 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  He presents today regarding routine screening colonoscopy at the urging of his primary provider.  He is accompanied by his girlfriend.  His GI review of systems is negative except for occasional loose stools.  No bleeding.  No family history of colon cancer.  For his diabetes he takes Protonix and an oral agent.  Echocardiogram shows an ejection fraction of 60 to 65%.  Review of blood work January 2022 shows unremarkable comprehensive metabolic panel and CBC.  Hemoglobin 16.3.  REVIEW OF SYSTEMS:  All non-GI ROS negative unless otherwise stated in the HPI.  Past Medical History:  Diagnosis Date   Charcot ankle    Essential hypertension    Osteomyelitis of toe of right foot (HCC)    Paroxysmal atrial fibrillation (HCC)    Recurrent boils    Type 2 diabetes mellitus with diabetic neuropathy [facility])    Diagnosed age 25    Past Surgical History:  Procedure Laterality Date   AMPUTATION Right 06/17/2014   Procedure: PARTIAL AMPUTATION RIGHT 3RD TOE;  Surgeon: [name], DPM;  Location: [facility];  Service: Podiatry;  Laterality: Right;   AMPUTATION Right 09/02/2014   Procedure: PARTIAL AMPUTATION 2ND TOE RIGHT FOOT;  Surgeon: [name], DPM;  Location: [facility];  Service: Podiatry;  Laterality: Right;   AMPUTATION Right 04/01/2015   Procedure: AMPUTATION DIGIT 1ST TOE RIGHT FOOT;  Surgeon: Laurell Josephs, DPM;  Location: [facility];  Service: Podiatry;  Laterality: Right;   AMPUTATION Right 06/29/2016   pinkeye    AMPUTATION Right 10/24/2019   Procedure: PARTIAL THIRD RAY AMPUTATION;  Surgeon: Ferman Hamming, DPM;  Location: [facility];  Service: Podiatry;  Laterality: Right;   AMPUTATION Right 02/19/2020   Procedure: AMPUTATION BELOW  KNEE; RIGHT;  Surgeon: [name], MD;  Location: [facility];  Service: General;  Laterality: Right;   FOOT AMPUTATION Left    FOOT SURGERY Left 03/2017   HERNIA REPAIR Bilateral    and 3rd hernia repair does not remember ehat side   IRRIGATION AND DEBRIDEMENT ABSCESS Right 11/05/2019   Procedure: IRRIGATION AND DRAINAGE ABSCESS RIGHT ANKLE;  Surgeon: Ferman Hamming, DPM;  Location: [facility];  Service: Podiatry;  Laterality: Right;   TEE WITHOUT CARDIOVERSION N/A 10/29/2019   Procedure: TRANSESOPHAGEAL ECHOCARDIOGRAM (TEE) WITH PROPOFOL;  Surgeon: Pricilla Riffle, MD;  Location: [facility];  Service: Cardiovascular;  Laterality: N/A;    Social History [name]  reports that he has never smoked. He has never used smokeless tobacco. He reports that he does not drink alcohol and does not use drugs.  family history includes Aneurysm in his father; Asthma in his father; COPD in his father; Diabetes in his brother and mother; Stroke in his brother and father.  No Known Allergies     PHYSICAL EXAMINATION: Vital signs: BP (!) 156/88   Pulse 84   Ht 6\' 2"  (1.88 m)   Wt 217 lb (98.4 kg)   BMI 27.86 kg/m   Constitutional: generally well-appearing, no acute distress Psychiatric: alert and oriented x3, cooperative Eyes: extraocular movements intact, anicteric, conjunctiva pink Mouth: oral pharynx moist, no lesions Neck: supple no lymphadenopathy Cardiovascular: heart regular rate and rhythm, no murmur Lungs: clear to auscultation bilaterally Abdomen: soft, nontender, nondistended, no obvious ascites, no peritoneal signs,  normal bowel sounds, no organomegaly Rectal: Deferred until colonoscopy Extremities: Status post BKA Skin: no lesions on visible extremities Neuro: No focal deficits.  Cranial nerves intact  ASSESSMENT:  1.  Screening colonoscopy.  Baseline for neoplasia 2.  Diabetes mellitus 3.  Status post BKA 4.  History of atrial fibrillation on Eliquis.   PLAN:  1.   Colonoscopy.  The patient is high risk given his comorbidities, the need to address anticoagulation, and adjust diabetic medications.The nature of the procedure, as well as the risks, benefits, and alternatives were carefully and thoroughly reviewed with the patient. Ample time for discussion and questions allowed. The patient understood, was satisfied, and agreed to proceed.  2.  Hold oral agent the day of the procedure and give one half of DM insulin.   3.  Hold Eliquis 2 days prior to the procedure.  Would anticipate resumption immediately post procedure.  We will confer with his cardiologist Dr. Diona Browner to see if this is acceptable

## 2021-04-18 NOTE — Telephone Encounter (Signed)
Patient with diagnosis of afib on Eliquis for anticoagulation.    Procedure: Colonoscopy Date of procedure: 08/02/21  CHA2DS2-VASc Score = 3  This indicates a 3.2% annual risk of stroke. The patient's score is based upon: CHF History: No HTN History: Yes Diabetes History: Yes Stroke History: No Vascular Disease History: Yes Age Score: 0 Gender Score: 0    CrCl 97.8 ml/min Platelet count 219  Of note patient has a hx of a single DVT  Per office protocol, patient can hold Eliquis for 2 days prior to procedure.

## 2021-04-18 NOTE — Telephone Encounter (Signed)
[facility] Pre-operative Risk Assessment     Request for surgical clearance:     Endoscopy Procedure  What type of surgery is being performed?     Colonoscopy  When is this surgery scheduled?     08/02/2021  What type of clearance is required ?   Pharmacy  Are there any medications that need to be held prior to surgery and how long? Eliquis - 2 days  Practice name and name of physician performing surgery?      [facility]  What is your office phone and fax number?      Phone- [phone]  [phone]  Anesthesia type (None, local, MAC, general) ?       MAC

## 2021-04-20 NOTE — Telephone Encounter (Signed)
Lm on vm that patient could hold his Eliquis for 2 days prior to his procedure.  Asked that he call back to let me know he received this message

## 2021-04-21 ENCOUNTER — Telehealth: Payer: Self-pay | Admitting: Pharmacist

## 2021-04-21 NOTE — Telephone Encounter (Signed)
Called Nov[employer] nordisk patient assistance  We will need to reapply for patient assistance on or after 06/02/21 Patient's next shipment will arrive at our practice in 10-14 business days (Ozempic)

## 2021-04-26 NOTE — Telephone Encounter (Signed)
Left another message that patient could hold Eliquis for 2 days prior to his procedure.

## 2021-05-02 DIAGNOSIS — Z89511 Acquired absence of right leg below knee: Secondary | ICD-10-CM | POA: Diagnosis not present

## 2021-05-02 DIAGNOSIS — Z89512 Acquired absence of left leg below knee: Secondary | ICD-10-CM | POA: Diagnosis not present

## 2021-05-04 DIAGNOSIS — Z794 Long term (current) use of insulin: Secondary | ICD-10-CM | POA: Diagnosis not present

## 2021-05-04 DIAGNOSIS — E1165 Type 2 diabetes mellitus with hyperglycemia: Secondary | ICD-10-CM | POA: Diagnosis not present

## 2021-05-10 NOTE — Telephone Encounter (Signed)
Patient messaged through [place] that he understood to hold his Eliquis or 2 days prior to his procedure.

## 2021-06-04 DIAGNOSIS — Z794 Long term (current) use of insulin: Secondary | ICD-10-CM | POA: Diagnosis not present

## 2021-06-04 DIAGNOSIS — E1165 Type 2 diabetes mellitus with hyperglycemia: Secondary | ICD-10-CM | POA: Diagnosis not present

## 2021-06-06 ENCOUNTER — Encounter: Payer: Self-pay | Admitting: Family

## 2021-06-06 ENCOUNTER — Other Ambulatory Visit: Payer: Self-pay

## 2021-06-06 ENCOUNTER — Ambulatory Visit (INDEPENDENT_AMBULATORY_CARE_PROVIDER_SITE_OTHER): Payer: Medicare Other | Admitting: Family

## 2021-06-06 VITALS — BP 154/82 | HR 81 | Temp 97.6°F | Ht 74.0 in | Wt 218.4 lb

## 2021-06-06 DIAGNOSIS — L0211 Cutaneous abscess of neck: Secondary | ICD-10-CM | POA: Diagnosis not present

## 2021-06-06 DIAGNOSIS — L02811 Cutaneous abscess of head [any part, except face]: Secondary | ICD-10-CM | POA: Diagnosis not present

## 2021-06-06 DIAGNOSIS — E1165 Type 2 diabetes mellitus with hyperglycemia: Secondary | ICD-10-CM

## 2021-06-06 DIAGNOSIS — Z794 Long term (current) use of insulin: Secondary | ICD-10-CM | POA: Diagnosis not present

## 2021-06-06 DIAGNOSIS — L0291 Cutaneous abscess, unspecified: Secondary | ICD-10-CM

## 2021-06-06 MED ORDER — SULFAMETHOXAZOLE-TRIMETHOPRIM 800-160 MG PO TABS: 1.0000 | ORAL_TABLET | Freq: Two times a day (BID) | ORAL | 0 refills | Status: DC

## 2021-06-06 NOTE — Progress Notes (Signed)
Subjective:    Patient ID: [name], male    DOB: Apr 07, 1966, 55 y.o.   MRN: [id number]  Chief Complaint  Patient presents with   Cyst    On head and neck been there for a month     HPI Pt presents to the office today with an abscess on his head about a month and a half ago. He reports it drained last week a bloody yellow discharge. He also had a spot on his neck that he noticed about a month ago and drained bloody & yellow discharge.   He reports the area on his head is a 5 or 6 out 10 when he lays on it or gets a hair cut.   He is a diabetic and his last A1C was 7.5.    Review of Systems  All other systems reviewed and are negative.     Objective:   Physical Exam Vitals reviewed.  Constitutional:      General: He is not in acute distress.    Appearance: He is well-developed.  HENT:     Head: Normocephalic.  Eyes:     General:        Right eye: No discharge.        Left eye: No discharge.     Pupils: Pupils are equal, round, and reactive to light.  Neck:     Thyroid: No thyromegaly.  Cardiovascular:     Rate and Rhythm: Normal rate and regular rhythm.     Heart sounds: Normal heart sounds. No murmur heard. Pulmonary:     Effort: Pulmonary effort is normal. No respiratory distress.     Breath sounds: Normal breath sounds. No wheezing.  Abdominal:     General: Bowel sounds are normal. There is no distension.     Palpations: Abdomen is soft.     Tenderness: There is no abdominal tenderness.  Musculoskeletal:        General: No tenderness. Normal range of motion.     Cervical back: Normal range of motion and neck supple.     Comments: Bilateral BKA  Skin:    General: Skin is warm and dry.     Findings: Abscess present. No erythema or rash.          Comments: Small hard abscess on scalp and right neck.   Neurological:     Mental Status: He is alert and oriented to person, place, and time.     Cranial Nerves: No cranial nerve deficit.     Deep Tendon  Reflexes: Reflexes are normal and symmetric.  Psychiatric:        Behavior: Behavior normal.        Thought Content: Thought content normal.        Judgment: Judgment normal.   Small incision made on scalp, Scan amount of thick yellow discharge removed. Wound culture performed.    BP (!) 154/82   Pulse 81   Temp 97.6 F (36.4 C) (Temporal)   Ht 6\' 2"  (1.88 m)   Wt 218 lb 6.4 oz (99.1 kg)   BMI 28.04 kg/m       Assessment & Plan:  [name] comes in today with chief complaint of Cyst (On head and neck been there for a month )   Diagnosis and orders addressed:  1. Abscess  2. Abscess, neck - sulfamethoxazole-trimethoprim (BACTRIM DS) 800-160 MG tablet; Take 1 tablet by mouth 2 (two) times daily.  Dispense: 14 tablet; Refill: 0 -  Anaerobic and Aerobic Culture  3. Abscess, scalp - sulfamethoxazole-trimethoprim (BACTRIM DS) 800-160 MG tablet; Take 1 tablet by mouth 2 (two) times daily.  Dispense: 14 tablet; Refill: 0 - Anaerobic and Aerobic Culture  4. Uncontrolled type 2 diabetes mellitus with hyperglycemia, with long-term current use of insulin (HCC)    Start Bactrim today Keep clean and dry Report increase redness, tender, or discharge.  Need to continue to have good control of DM.  RTO if symptoms worsen   Jannifer Rodney, FNP

## 2021-06-06 NOTE — Patient Instructions (Signed)
Skin Abscess  A skin abscess is an infected area on or under your skin that contains a collection of pus and other material. An abscess may also be called a furuncle,carbuncle, or boil. An abscess can occur in or on almost any part of your body. Some abscesses break open (rupture) on their own. Most continue to get worse unless they are treated. The infection can spread deeper into the body and eventually into your blood, whichcan make you feel ill. Treatment usually involves draining the abscess. What are the causes? An abscess occurs when germs, like bacteria, pass through your skin and cause an infection. This may be caused by: A scrape or cut on your skin. A puncture wound through your skin, including a needle injection or insect bite. Blocked oil or sweat glands. Blocked and infected hair follicles. A cyst that forms beneath your skin (sebaceous cyst) and becomes infected. What increases the risk? This condition is more likely to develop in people who: Have a weak body defense system (immune system). Have diabetes. Have dry and irritated skin. Get frequent injections or use illegal IV drugs. Have a foreign body in a wound, such as a splinter. Have problems with their lymph system or veins. What are the signs or symptoms? Symptoms of this condition include: A painful, firm bump under the skin. A bump with pus at the top. This may break through the skin and drain. Other symptoms include: Redness surrounding the abscess site. Warmth. Swelling of the lymph nodes (glands) near the abscess. Tenderness. A sore on the skin. How is this diagnosed? This condition may be diagnosed based on: A physical exam. Your medical history. A sample of pus. This may be used to find out what is causing the infection. Blood tests. Imaging tests, such as an ultrasound, CT scan, or MRI. How is this treated? A small abscess that drains on its own may not need treatment. Treatment for larger abscesses  may include: Moist heat or heat pack applied to the area several times a day. A procedure to drain the abscess (incision and drainage). Antibiotic medicines. For a severe abscess, you may first get antibiotics through an IV and then change to antibiotics by mouth. Follow these instructions at home: Medicines  Take over-the-counter and prescription medicines only as told by your health care provider. If you were prescribed an antibiotic medicine, take it as told by your health care provider. Do not stop taking the antibiotic even if you start to feel better.  Abscess care  If you have an abscess that has not drained, apply heat to the affected area. Use the heat source that your health care provider recommends, such as a moist heat pack or a heating pad. Place a towel between your skin and the heat source. Leave the heat on for 20-30 minutes. Remove the heat if your skin turns bright red. This is especially important if you are unable to feel pain, heat, or cold. You may have a greater risk of getting burned. Follow instructions from your health care provider about how to take care of your abscess. Make sure you: Cover the abscess with a bandage (dressing). Change your dressing or gauze as told by your health care provider. Wash your hands with soap and water before you change the dressing or gauze. If soap and water are not available, use hand sanitizer. Check your abscess every day for signs of a worsening infection. Check for: More redness, swelling, or pain. More fluid or blood. Warmth. More   pus or a bad smell.  General instructions To avoid spreading the infection: Do not share personal care items, towels, or hot tubs with others. Avoid making skin contact with other people. Keep all follow-up visits as told by your health care provider. This is important. Contact a health care provider if you have: More redness, swelling, or pain around your abscess. More fluid or blood coming  from your abscess. Warm skin around your abscess. More pus or a bad smell coming from your abscess. A fever. Muscle aches. Chills or a general ill feeling. Get help right away if you: Have severe pain. See red streaks on your skin spreading away from the abscess. Summary A skin abscess is an infected area on or under your skin that contains a collection of pus and other material. A small abscess that drains on its own may not need treatment. Treatment for larger abscesses may include having a procedure to drain the abscess and taking an antibiotic. This information is not intended to replace advice given to you by your health care provider. Make sure you discuss any questions you have with your healthcare provider. Document Revised: 02/13/2019 Document Reviewed: 12/06/2017 Elsevier Patient Education  2022 Elsevier Inc.  

## 2021-06-10 DIAGNOSIS — Z029 Encounter for administrative examinations, unspecified: Secondary | ICD-10-CM

## 2021-06-13 LAB — ANAEROBIC AND AEROBIC CULTURE

## 2021-06-17 ENCOUNTER — Ambulatory Visit: Payer: Medicare Other | Admitting: Family Medicine

## 2021-06-27 ENCOUNTER — Telehealth: Payer: Self-pay | Admitting: *Deleted

## 2021-06-27 NOTE — Telephone Encounter (Signed)
Pt called and aware that [place] ordered for pt assistance came in with pen tip needles  #5 Tresiba 200 came in  #2 boxes of needles came in   Pt aware to pick up - placed in fridge with name and dob on it.   Read last pharm note -   This med is not in MED LIST.  Pt is unaware of a change being made. Aware to come pick up meds and JULIE will call him on Tuesday 06/28/21.  Aware to not make any changes until conversation with Raynelle Fanning - pharm.

## 2021-06-28 NOTE — Telephone Encounter (Signed)
TRANSITION TOUJEO TO TRESIBA FOR EASE OF PATIENT ASSISTANCE.  SAME TOTAL DAILY DOSE (PATIENT VERBALIZES UNDERSTANDING)  AWAITING OZEMPIC SHIPMENT  PATIENT NEEDS TO SPEND 3% OUT OF POCKET TO QUALIFY FOR ELIQUIS PROGRAM  PATIENT TO LET ME KNOW THIS INFO

## 2021-07-01 ENCOUNTER — Ambulatory Visit (INDEPENDENT_AMBULATORY_CARE_PROVIDER_SITE_OTHER): Payer: Medicare Other | Admitting: Pharmacist

## 2021-07-01 DIAGNOSIS — Z794 Long term (current) use of insulin: Secondary | ICD-10-CM | POA: Diagnosis not present

## 2021-07-01 DIAGNOSIS — E1165 Type 2 diabetes mellitus with hyperglycemia: Secondary | ICD-10-CM

## 2021-07-04 ENCOUNTER — Telehealth: Payer: Self-pay | Admitting: Family Medicine

## 2021-07-05 DIAGNOSIS — Z794 Long term (current) use of insulin: Secondary | ICD-10-CM | POA: Diagnosis not present

## 2021-07-05 DIAGNOSIS — E1165 Type 2 diabetes mellitus with hyperglycemia: Secondary | ICD-10-CM | POA: Diagnosis not present

## 2021-07-05 NOTE — Patient Instructions (Signed)
Visit Information  PATIENT GOALS:  Goals Addressed               This Visit's Progress     Patient Stated     T2DM (pt-stated)        Current Barriers:  Unable to independently afford treatment regimen Unable to maintain control of T2DM   Pharmacist Clinical Goal(s):  Over the next 90 days, patient will verbalize ability to afford treatment regimen maintain control of T2DM as evidenced by IMPROVED GLYCEMIC CONTROL  through collaboration with PharmD and provider.    Interventions: 1:1 collaboration with Dettinger, Elige Radon, MD regarding development and update of comprehensive plan of care as evidenced by provider attestation and co-signature Inter-disciplinary care team collaboration (see longitudinal plan of care) Comprehensive medication review performed; medication list updated in electronic medical record  Diabetes: Uncontrolled (A1C 7.5%); current treatment:TRESIBA 60 UNITS DAILY, OZEMIPIC 0.5MG  SQ WEEKLY;  ENROLLED IN [employer] PATIENT ASSISTANCE PROGRAM (PREVIOUSLY WITH HEALTH DEPT) USING ANYWHERE FROM 60-70 UNITS OF TRESIBA USES LIBRE CGM--NEED TO CLARIFY 14 DAY VS 2 SYSTEM (CALLED IN DIFFERENTLY) DISCUSSED INCREASING OZEMPIC TO 1MG  AT F/U IF NOT CONTROLLED Denies personal and family history of Medullary thyroid cancer (MTC) Current glucose readings: fasting glucose: <150, post prandial glucose: <200 Denies hypoglycemic/hyperglycemic symptoms Discussed meal planning options and Plate method for healthy eating Avoid sugary drinks and desserts Incorporate balanced protein, non starchy veggies, 1 serving of carbohydrate with each meal Increase water intake Increase physical activity as able Current exercise: N/A Recommended INCREASE IN GLP1 (OZEMPIC) Assessed patient finances. ENROLLED PATIENT IN [employer] PATIENT ASSISTANCE   Patient Goals/Self-Care Activities Over the next 90 days, patient will:  - take medications as prescribed check glucose USING LIBRE  CGM, document, and provide at future appointments collaborate with provider on medication access solutions  Follow Up Plan: Telephone follow up appointment with care management team member scheduled for: 1 MONTH         The patient verbalized understanding of instructions, educational materials, and care plan provided today and declined offer to receive copy of patient instructions, educational materials, and care plan.   Telephone follow up appointment with care management team member scheduled for: 1 MONTH  Signature , PharmD, BCPS Clinical Pharmacist, [facility]  II Phone [phone]

## 2021-07-05 NOTE — Progress Notes (Signed)
Chronic Care Management Pharmacy Note  07/01/2021 Name:  [name] MRN:  [id number] DOB:  05-02-1966  Summary: T2DM  Recommendations/Changes made from today's visit: Diabetes: Uncontrolled (A1C 7.5%); current treatment:TRESIBA 60 UNITS DAILY, OZEMIPIC 0.5MG SQ WEEKLY;  ENROLLED IN NOVO [place] PATIENT ASSISTANCE PROGRAM (PREVIOUSLY WITH HEALTH DEPT) USING ANYWHERE FROM 60-70 UNITS OF TRESIBA USES LIBRE CGM--NEED TO CLARIFY 14 DAY VS 2 SYSTEM (CALLED IN DIFFERENTLY) DISCUSSED INCREASING OZEMPIC TO 1MG AT F/U IF NOT CONTROLLED Denies personal and family history of Medullary thyroid cancer (MTC) Current glucose readings: fasting glucose: <150, post prandial glucose: <200 Denies hypoglycemic/hyperglycemic symptoms Discussed meal planning options and Plate method for healthy eating Avoid sugary drinks and desserts Incorporate balanced protein, non starchy veggies, 1 serving of carbohydrate with each meal Increase water intake Increase physical activity as able Current exercise: N/A Recommended INCREASE IN GLP1 (OZEMPIC) Assessed patient finances. ENROLLED PATIENT IN NOVO [place] PATIENT ASSISTANCE   Follow Up Plan: Telephone follow up appointment with care management team member scheduled for: 1 MONTH  Subjective: [name] is an 55 y.o. year old male who is a primary patient of Dettinger, [name], MD.  The CCM team was consulted for assistance with disease management and care coordination needs.    Engaged with patient by telephone for initial visit in response to provider referral for pharmacy case management and/or care coordination services.   Consent to Services:  The patient was given the following information about Chronic Care Management services today, agreed to services, and gave verbal consent: 1. CCM service includes personalized support from designated clinical staff supervised by the primary care provider, including individualized plan of care and  coordination with other care providers 2. 24/7 contact phone numbers for assistance for urgent and routine care needs. 3. Service will only be billed when office clinical staff spend 20 minutes or more in a month to coordinate care. 4. Only one practitioner may furnish and bill the service in a calendar month. 5.The patient may stop CCM services at any time (effective at the end of the month) by phone call to the office staff. 6. The patient will be responsible for cost sharing (co-pay) of up to 20% of the service fee (after annual deductible is met). Patient agreed to services and consent obtained.  Patient Care Team: Dettinger, [name], MD as PCP - General (Family Medicine) Satira Sark, MD as PCP - Cardiology (Cardiology) Zannie Kehr, MD as Referring Physician (Orthopedic Surgery) Lavera Guise, [facility] (Pharmacist)  Objective:  Lab Results  Component Value Date   CREATININE 0.98 12/03/2020   CREATININE 0.88 04/23/2020   CREATININE 0.68 02/21/2020    Lab Results  Component Value Date   HGBA1C 7.5 (H) 03/03/2021   Last diabetic Eye exam:  Lab Results  Component Value Date/Time   HMDIABEYEEXA No Retinopathy 08/18/2020 12:00 AM    Last diabetic Foot exam: No results found for: HMDIABFOOTEX      Component Value Date/Time   CHOL 84 (L) 12/03/2020 1118   TRIG 199 (H) 12/03/2020 1118   HDL 33 (L) 12/03/2020 1118   CHOLHDL 2.5 12/03/2020 1118   CHOLHDL 4.6 01/30/2018 1347   VLDL 54 (H) 01/30/2018 1347   LDLCALC 20 12/03/2020 1118    Hepatic Function Latest Ref Rng & Units 12/03/2020 04/23/2020 11/04/2019  Total Protein 6.0 - 8.5 g/dL 6.8 6.8 6.4(L)  Albumin 3.8 - 4.9 g/dL 4.2 4.1 1.8(L)  AST 0 - 40 IU/L 20 17 18  ALT 0 - 44 IU/L 37 27 17  Alk Phosphatase 44 - 121 IU/L 142(H) 171(H) 106  Total Bilirubin 0.0 - 1.2 mg/dL 0.5 0.4 0.9    Lab Results  Component Value Date/Time   TSH 0.961 12/03/2020 11:18 AM   TSH 0.929 10/23/2019 03:59 AM   TSH 1.490 03/09/2014  11:09 AM    CBC Latest Ref Rng & Units 12/03/2020 04/23/2020 02/23/2020  WBC 3.4 - 10.8 x10E3/uL 7.0 4.8 -  Hemoglobin 13.0 - 17.7 g/dL 16.3 14.5 8.4(L)  Hematocrit 37.5 - 51.0 % 48.7 42.8 27.4(L)  Platelets 150 - 450 x10E3/uL 219 225 -    No results found for: VD25OH  Clinical ASCVD: No  The ASCVD Risk score Mikey Bussing DC Jr., et al., 2013) failed to calculate for the following reasons:   The valid total cholesterol range is 130 to 320 mg/dL    Other: (CHADS2VASc if Afib, PHQ9 if depression, MMRC or CAT for COPD, ACT, DEXA)  Social History   Tobacco Use  Smoking Status Never  Smokeless Tobacco Never   BP Readings from Last 3 Encounters:  06/06/21 (!) 154/82  04/18/21 (!) 156/88  03/31/21 116/72   Pulse Readings from Last 3 Encounters:  06/06/21 81  04/18/21 84  03/31/21 76   Wt Readings from Last 3 Encounters:  06/06/21 218 lb 6.4 oz (99.1 kg)  04/18/21 217 lb (98.4 kg)  03/31/21 219 lb (99.3 kg)    Assessment: Review of patient past medical history, allergies, medications, health status, including review of consultants reports, laboratory and other test data, was performed as part of comprehensive evaluation and provision of chronic care management services.   SDOH:  (Social Determinants of Health) assessments and interventions performed:    CCM Care Plan  No Known Allergies  Medications Reviewed Today     Reviewed by Lavera Guise, [facility] (Pharmacist) on 07/05/21 at [phone]  Med List Status: <None>   Medication Order Taking? Sig Documenting Provider Last Dose Status Informant  albuterol (PROVENTIL HFA;VENTOLIN HFA) 108 (90 Base) MCG/ACT inhaler [id number] No Inhale 2 puffs into the lungs every 6 (six) hours as needed for wheezing or shortness of breath. Dettinger, [name], MD Taking Active Self  amiodarone (PACERONE) 200 MG tablet [id number] No Take 1 tablet (200 mg total) by mouth daily. Satira Sark, MD Taking Active   apixaban (ELIQUIS) 5 MG TABS tablet  [id number] No Take 1 tablet (5 mg total) by mouth 2 (two) times daily. Dettinger, [name], MD Taking Active   atorvastatin (LIPITOR) 20 MG tablet [id number] No Take 1 tablet (20 mg total) by mouth daily. Dettinger, [name], MD Taking Active   Continuous Blood Gluc Receiver (FREESTYLE LIBRE 2 READER) [place] [id number] No Use as directed to test blood sugar up to 6 times daily as directed. DX: E 11.9 Dettinger, [name], MD Taking Active   Continuous Blood Gluc Sensor (FREESTYLE LIBRE 14 DAY SENSOR) [place] [id number] No Use as directed to test blood sugar up to 6 times daily as directed. DX: E 11.9 Dettinger, [name], MD Taking Active   DULoxetine (CYMBALTA) 60 MG capsule [id number] No Take 1 capsule (60 mg total) by mouth daily. Dettinger, [name], MD Taking Active   fluticasone [facility]) 50 MCG/ACT nasal spray [id number] No Place 1 spray into both nostrils 2 (two) times daily as needed for allergies or rhinitis. Dettinger, [name], MD Taking Active Self  gabapentin (NEURONTIN) 600 MG tablet [id number] No TAKE 1 TABLET BY MOUTH THREE TIMES A  DAY Dettinger, [name], MD Taking Active   HYDROcodone-acetaminophen [facility]) 10-325 MG tablet [id number] No Take 1 tablet by mouth 2 (two) times daily as needed. Dettinger, [name], MD Taking Active   insulin degludec (TRESIBA FLEXTOUCH) 100 UNIT/ML FlexTouch Pen [id number]  Inject 50-70 Units into the skin daily. [provider]  Active            Med Note Blanca Friend, Royce Macadamia   Tue Jun 28, 2021 11:55 AM) VIA NOVO [facility] PATIENT ASSISTANCE PROGRAM  metFORMIN (GLUCOPHAGE) 1000 MG tablet [id number] No Take 1 tablet (1,000 mg total) by mouth 2 (two) times daily. Dettinger, [name], MD Taking Active   metoprolol succinate (TOPROL-XL) 25 MG 24 hr tablet [id number] No TAKE 1 TABLET BY MOUTH EVERY DAY Satira Sark, MD Taking Active   minocycline (MINOCIN) 100 MG capsule [id number] No Take 1 capsule (100 mg total) by mouth 2 (two) times daily. Take on an empty stomach  Stacks, Cletus Gash, MD Taking Active   Multiple Vitamins-Minerals (MULTIVITAMIN WITH MINERALS) tablet [id number] No Take 1 tablet by mouth daily. [provider] Taking Active Self  Multiple Vitamins-Minerals (THERA-M) TABS [id number] No Take by mouth. [provider] Taking Active   Semaglutide,0.25 or 0.5MG/DOS, (OZEMPIC, 0.25 OR 0.5 MG/DOSE,) 2 MG/1.5ML SOPN [id number] No Inject 0.5 mg into the skin once a week. Dettinger, [name], MD Taking Active            Med Note Blanca Friend, Royce Macadamia   Tue Jun 28, 2021 11:55 AM) VIA NOVO [facility] PATIENT ASSISTANCE PROGRAM  sulfamethoxazole-trimethoprim (BACTRIM DS) 800-160 MG tablet [id number]  Take 1 tablet by mouth 2 (two) times daily. Sharion Balloon, FNP  Active             Patient Active Problem List   Diagnosis Date Noted   S/P BKA (below knee amputation) unilateral, right ([place]) 02/19/2020   Osteomyelitis of ankle and foot ([place])    Abscess of tendon sheath, right ankle and foot 12/24/2019   Bacteremia due to methicillin susceptible Staphylococcus aureus (MSSA) 12/24/2019   History of DVT (deep vein thrombosis) 10/26/2019   Uncontrolled type 2 diabetes mellitus with hyperglycemia, with long-term current use of insulin ([place]) 10/23/2019   A-fib ([place]) 10/22/2019   Lumbar spine scoliosis 08/13/2018   S/P BKA (below knee amputation) unilateral, left ([place]) 01/12/2018   Pain management contract signed 05/04/2017   Peripheral edema 10/02/2016   Polycythemia 12/09/2015   Type 2 diabetes mellitus ([place]) 07/22/2015   Osteomyelitis of toe of right foot ([place]) 06/13/2014   Neuropathy in diabetes ([place]) 03/31/2014   Essential hypertension, benign 03/03/2013   DDD (degenerative disc disease), lumbar 03/03/2013    Immunization History  Administered Date(s) Administered   Influenza, Seasonal, Injecte, Preservative Fre 10/15/2015, 08/05/2016   Influenza,inj,Quad PF,6+ Mos 10/15/2015, 08/05/2016, 08/21/2017, 08/07/2018, 07/22/2019, 09/01/2020    Moderna Sars-Covid-2 Vaccination 05/20/2020, 06/14/2020   Pneumococcal Conjugate-13 07/22/2019, 07/08/2020   Tdap 07/08/2020   Zoster Recombinat (Shingrix) 06/30/2019, 10/06/2019    Conditions to be addressed/monitored: DMII  Care Plan : PHARMD MEDICATION MANAGEMENT  Updates made by Lavera Guise, [place] since 07/05/2021 12:00 AM     Problem: DISEASE PROGRESSION PREVENTION      Long-Range Goal: T2DM   This Visit's Progress: Not on track  Priority: High  Note:   Current Barriers:  Unable to independently afford treatment regimen Unable to maintain control of T2DM   Pharmacist Clinical Goal(s):  Over the next 90 days, patient will verbalize ability to  afford treatment regimen maintain control of T2DM as evidenced by IMPROVED GLYCEMIC CONTROL  through collaboration with PharmD and provider.    Interventions: 1:1 collaboration with Dettinger, [name], MD regarding development and update of comprehensive plan of care as evidenced by provider attestation and co-signature Inter-disciplinary care team collaboration (see longitudinal plan of care) Comprehensive medication review performed; medication list updated in electronic medical record  Diabetes: Uncontrolled (A1C 7.5%); current treatment:TRESIBA 60 UNITS DAILY, OZEMIPIC 0.5MG SQ WEEKLY;  ENROLLED IN NOVO [place] ([place] DEPT) USING ANYWHERE FROM 60-70 UNITS OF TRESIBA USES LIBRE CGM--NEED TO CLARIFY 14 DAY VS 2 SYSTEM (CALLED IN DIFFERENTLY) [place] TO 1MG AT F/U IF NOT CONTROLLED Denies personal and family history of Medullary thyroid cancer (MTC) Current glucose readings: fasting glucose: <150, post prandial glucose: <200 Denies hypoglycemic/hyperglycemic symptoms Discussed meal planning options and Plate method for healthy eating Avoid sugary drinks and desserts Incorporate balanced protein, non starchy veggies, 1 serving of carbohydrate with each  meal Increase water intake Increase physical activity as able Current exercise: N/A Recommended INCREASE IN GLP1 ([place]) Assessed patient finances. ENROLLED PATIENT IN NOVO [place] PATIENT ASSISTANCE   Patient Goals/Self-Care Activities Over the next 90 days, patient will:  - take medications as prescribed check glucose USING LIBRE CGM, document, and provide at future appointments collaborate with provider on medication access solutions  Follow Up Plan: Telephone follow up appointment with care management team member scheduled for: 1 MONTH      Medication Assistance: Application for NOVO [place] (TRESBIA/OZEMPIC)  medication assistance program. in process.  Anticipated assistance start date TBD.  See plan of care for additional detail.  Patient's preferred pharmacy is:  [id number] [place] [place]Phone: [phone]: [phone] [facility] [place]Phone: [phone]: [phone] [email or web address] [place]- [phone] [address]arth[place]Phone: [phone]: [phone] [place]Phone: [phone]: [phone]  Follow Up:  Patient agrees to Care Plan and Follow-up.  Plan: Telephone follow up appointment with care management team member scheduled for:  1 MONTH  JRegina Eck PharmD, BCPS Clinical Pharmacist, [place] Phone [phone]

## 2021-07-08 ENCOUNTER — Ambulatory Visit: Payer: Medicaid Other | Admitting: Cardiology

## 2021-07-14 ENCOUNTER — Ambulatory Visit: Payer: Medicare Other

## 2021-07-14 ENCOUNTER — Other Ambulatory Visit: Payer: Self-pay

## 2021-07-14 ENCOUNTER — Ambulatory Visit (INDEPENDENT_AMBULATORY_CARE_PROVIDER_SITE_OTHER): Payer: Medicare Other | Admitting: Family Medicine

## 2021-07-14 ENCOUNTER — Telehealth: Payer: Self-pay | Admitting: Family Medicine

## 2021-07-14 ENCOUNTER — Encounter: Payer: Self-pay | Admitting: Family Medicine

## 2021-07-14 VITALS — BP 130/80 | HR 74 | Ht 74.0 in | Wt 221.0 lb

## 2021-07-14 DIAGNOSIS — I10 Essential (primary) hypertension: Secondary | ICD-10-CM

## 2021-07-14 DIAGNOSIS — M79673 Pain in unspecified foot: Secondary | ICD-10-CM

## 2021-07-14 DIAGNOSIS — E1140 Type 2 diabetes mellitus with diabetic neuropathy, unspecified: Secondary | ICD-10-CM

## 2021-07-14 DIAGNOSIS — E785 Hyperlipidemia, unspecified: Secondary | ICD-10-CM

## 2021-07-14 DIAGNOSIS — E1142 Type 2 diabetes mellitus with diabetic polyneuropathy: Secondary | ICD-10-CM | POA: Diagnosis not present

## 2021-07-14 DIAGNOSIS — Z794 Long term (current) use of insulin: Secondary | ICD-10-CM

## 2021-07-14 DIAGNOSIS — Z89512 Acquired absence of left leg below knee: Secondary | ICD-10-CM

## 2021-07-14 DIAGNOSIS — I4811 Longstanding persistent atrial fibrillation: Secondary | ICD-10-CM

## 2021-07-14 DIAGNOSIS — M5136 Other intervertebral disc degeneration, lumbar region: Secondary | ICD-10-CM | POA: Diagnosis not present

## 2021-07-14 DIAGNOSIS — G8929 Other chronic pain: Secondary | ICD-10-CM

## 2021-07-14 DIAGNOSIS — Z0289 Encounter for other administrative examinations: Secondary | ICD-10-CM

## 2021-07-14 LAB — BAYER DCA HB A1C WAIVED: HB A1C (BAYER DCA - WAIVED): 7.1 % — ABNORMAL HIGH (ref 4.8–5.6)

## 2021-07-14 MED ORDER — HYDROCODONE-ACETAMINOPHEN 10-325 MG PO TABS: 1.0000 | ORAL_TABLET | Freq: Two times a day (BID) | ORAL | 0 refills | Status: DC | PRN

## 2021-07-14 MED ORDER — METFORMIN HCL 1000 MG PO TABS: 1000.0000 mg | ORAL_TABLET | Freq: Two times a day (BID) | ORAL | 3 refills | Status: DC

## 2021-07-14 MED ORDER — DULOXETINE HCL 60 MG PO CPEP: 60.0000 mg | ORAL_CAPSULE | Freq: Every day | ORAL | 3 refills | Status: DC

## 2021-07-14 NOTE — Progress Notes (Signed)
BP 130/80   Pulse 74   Ht 6\' 2"  (1.88 m)   Wt 221 lb (100.2 kg)   SpO2 96%   BMI 28.37 kg/m    Subjective:   Patient ID: [name], male    DOB: Jul 24, 1966, 55 y.o.   MRN: [id number]  HPI: [name] is a 55 y.o. male presenting on 07/14/2021 for Pain Management (Request refills of Hydrocodone)   HPI Pain assessment: Cause of pain-degenerative disc disease lumbar Pain location-lower back bilateral Pain on scale of 1-10- 5 Frequency-Daily What increases pain-being on his feet especially walking with his prosthetics What makes pain Better-hydrocodone Effects on ADL -minimal Any change in general medical condition-none recently  Current opioids rx-hydrocodone-acetaminophen 10-3 25 twice daily as needed # meds rx-60 Effectiveness of current meds-works well Adverse reactions from pain meds-none Morphine equivalent-20  Pill count performed-No Last drug screen -03/10/2019 ( high risk q79m, moderate risk q80m, low risk yearly ) Urine drug screen today- No Was the NCCSR reviewed-yes  If yes were their any concerning findings? -None  No flowsheet data found.   Pain contract signed on: 03/10/2019  Type 2 diabetes mellitus Patient comes in today for recheck of his diabetes. Patient has been currently taking 05/10/2019 and Ozempic and metformin. Patient is not currently on an ACE inhibitor/ARB. Patient has seen an ophthalmologist this year. Patient is bilateral below the knee amputee. The symptom started onset as an adult hypertension and hyperlipidemia and neuropathy and history of bilateral amputee's ARE RELATED TO DM   Hypertension and A. fib Patient is currently on amiodarone and metoprolol and Eliquis, sees cardiology, and their blood pressure today is 130/80. Patient denies any lightheadedness or dizziness. Patient denies headaches, blurred vision, chest pains, shortness of breath, or weakness. Denies any side effects from medication and is content with current medication.    Hyperlipidemia and dyslipidemia Patient is coming in for recheck of his hyperlipidemia. The patient is currently taking atorvastatin. They deny any issues with myalgias or history of liver damage from it. They deny any focal numbness or weakness or chest pain.   Relevant past medical, surgical, family and social history reviewed and updated as indicated. Interim medical history since our last visit reviewed. Allergies and medications reviewed and updated.  Review of Systems  Constitutional:  Negative for chills and fever.  Eyes:  Negative for visual disturbance.  Respiratory:  Negative for shortness of breath and wheezing.   Cardiovascular:  Negative for chest pain and leg swelling.  Musculoskeletal:  Positive for arthralgias and back pain.  Skin:  Negative for rash.  Neurological:  Positive for numbness. Negative for dizziness, weakness and headaches.  All other systems reviewed and are negative.  Per HPI unless specifically indicated above   Allergies as of 07/14/2021   No Known Allergies      Medication List        Accurate as of July 14, 2021 10:33 AM. If you have any questions, ask your nurse or doctor.          STOP taking these medications    minocycline 100 MG capsule Commonly known as: Minocin Stopped by: July 16, 2021 Pablo Stauffer, MD   sulfamethoxazole-trimethoprim 800-160 MG tablet Commonly known as: Bactrim DS Stopped by: Elige Radon Theola Cuellar, MD       TAKE these medications    albuterol 108 (90 Base) MCG/ACT inhaler Commonly known as: VENTOLIN HFA Inhale 2 puffs into the lungs every 6 (six) hours as needed for wheezing or shortness  of breath.   amiodarone 200 MG tablet Commonly known as: PACERONE Take 1 tablet (200 mg total) by mouth daily.   apixaban 5 MG Tabs tablet Commonly known as: ELIQUIS Take 1 tablet (5 mg total) by mouth 2 (two) times daily.   atorvastatin 20 MG tablet Commonly known as: LIPITOR Take 1 tablet (20 mg total) by mouth  daily.   DULoxetine 60 MG capsule Commonly known as: Cymbalta Take 1 capsule (60 mg total) by mouth daily.   fluticasone 50 MCG/ACT nasal spray Commonly known as: FLONASE Place 1 spray into both nostrils 2 (two) times daily as needed for allergies or rhinitis.   FreeStyle Libre 14 Day Sensor Misc Use as directed to test blood sugar up to 6 times daily as directed. DX: E 11.9   FreeStyle Libre 2 Reader [facility] Use as directed to test blood sugar up to 6 times daily as directed. DX: E 11.9   gabapentin 600 MG tablet Commonly known as: NEURONTIN TAKE 1 TABLET BY MOUTH THREE TIMES A DAY   HYDROcodone-acetaminophen 10-325 MG tablet Commonly known as: NORCO Take 1 tablet by mouth 2 (two) times daily as needed. What changed: Another medication with the same name was added. Make sure you understand how and when to take each. Changed by: Nils Pyle, MD   HYDROcodone-acetaminophen 10-325 MG tablet Commonly known as: NORCO Take 1 tablet by mouth 2 (two) times daily as needed. Start taking on: August 12, 2021 What changed: You were already taking a medication with the same name, and this prescription was added. Make sure you understand how and when to take each. Changed by: Nils Pyle, MD   HYDROcodone-acetaminophen 10-325 MG tablet Commonly known as: NORCO Take 1 tablet by mouth 2 (two) times daily as needed. Start taking on: September 12, 2021 What changed: You were already taking a medication with the same name, and this prescription was added. Make sure you understand how and when to take each. Changed by: Elige Radon Yizel Canby, MD   metFORMIN 1000 MG tablet Commonly known as: GLUCOPHAGE Take 1 tablet (1,000 mg total) by mouth 2 (two) times daily.   metoprolol succinate 25 MG 24 hr tablet Commonly known as: TOPROL-XL TAKE 1 TABLET BY MOUTH EVERY DAY   multivitamin with minerals tablet Take 1 tablet by mouth daily.   Thera-M Tabs Take by mouth.   Ozempic (0.25 or  0.5 MG/DOSE) 2 MG/1.5ML Sopn Generic drug: Semaglutide(0.25 or 0.5MG /DOS) Inject 0.5 mg into the skin once a week.   Evaristo Bury FlexTouch 100 UNIT/ML FlexTouch Pen Generic drug: insulin degludec Inject 50-70 Units into the skin daily.         Objective:   BP 130/80   Pulse 74   Ht 6\' 2"  (1.88 m)   Wt 221 lb (100.2 kg)   SpO2 96%   BMI 28.37 kg/m   Wt Readings from Last 3 Encounters:  07/14/21 221 lb (100.2 kg)  06/06/21 218 lb 6.4 oz (99.1 kg)  04/18/21 217 lb (98.4 kg)    Physical Exam Vitals and nursing note reviewed.  Constitutional:      General: He is not in acute distress.    Appearance: He is well-developed. He is not diaphoretic.  Eyes:     General: No scleral icterus.    Conjunctiva/sclera: Conjunctivae normal.  Neck:     Thyroid: No thyromegaly.  Cardiovascular:     Rate and Rhythm: Normal rate and regular rhythm.     Heart sounds: Normal heart  sounds. No murmur heard. Pulmonary:     Effort: Pulmonary effort is normal. No respiratory distress.     Breath sounds: Normal breath sounds. No wheezing.  Musculoskeletal:     Cervical back: Neck supple.  Lymphadenopathy:     Cervical: No cervical adenopathy.  Skin:    General: Skin is warm and dry.     Findings: Rash (Small palpable on left jawline, looks like it is already drained and healing, is using topical treatments, does not want antibiotic today) present.  Neurological:     Mental Status: He is alert and oriented to person, place, and time.     Coordination: Coordination normal.  Psychiatric:        Behavior: Behavior normal.      Assessment & Plan:   Problem List Items Addressed This Visit       Cardiovascular and Mediastinum   Essential hypertension, benign - Primary   A-fib (HCC)     Endocrine   Type 2 diabetes mellitus (HCC)   Relevant Medications   metFORMIN (GLUCOPHAGE) 1000 MG tablet     Musculoskeletal and Integument   DDD (degenerative disc disease), lumbar   Relevant  Medications   HYDROcodone-acetaminophen (NORCO) 10-325 MG tablet   HYDROcodone-acetaminophen (NORCO) 10-325 MG tablet (Start on 08/12/2021)   HYDROcodone-acetaminophen (NORCO) 10-325 MG tablet (Start on 09/12/2021)     Other   Pain management contract signed   Relevant Medications   HYDROcodone-acetaminophen (NORCO) 10-325 MG tablet   HYDROcodone-acetaminophen (NORCO) 10-325 MG tablet (Start on 08/12/2021)   HYDROcodone-acetaminophen (NORCO) 10-325 MG tablet (Start on 09/12/2021)   S/P BKA (below knee amputation) unilateral, left (HCC)   Relevant Medications   HYDROcodone-acetaminophen (NORCO) 10-325 MG tablet   HYDROcodone-acetaminophen (NORCO) 10-325 MG tablet (Start on 08/12/2021)   HYDROcodone-acetaminophen (NORCO) 10-325 MG tablet (Start on 09/12/2021)   Dyslipidemia   Other Visit Diagnoses     Chronic foot pain, unspecified laterality       Relevant Medications   DULoxetine (CYMBALTA) 60 MG capsule   HYDROcodone-acetaminophen (NORCO) 10-325 MG tablet   HYDROcodone-acetaminophen (NORCO) 10-325 MG tablet (Start on 08/12/2021)   HYDROcodone-acetaminophen (NORCO) 10-325 MG tablet (Start on 09/12/2021)       Continue current medicine.  We will check blood work today.  Refilled pain management Follow up plan: Return in about 3 months (around 10/13/2021), or if symptoms worsen or fail to improve, for Pain management and diabetes and hypertension.  Counseling provided for all of the vaccine components No orders of the defined types were placed in this encounter.   Arville Care, MD [facility] 07/14/2021, 10:33 AM

## 2021-07-14 NOTE — Telephone Encounter (Signed)
Pt aware.

## 2021-07-14 NOTE — Addendum Note (Signed)
Addended by: Arville Care on: 07/14/2021 10:40 AM   Modules accepted: Orders

## 2021-07-15 LAB — CMP14+EGFR
ALT: 61 IU/L — ABNORMAL HIGH (ref 0–44)
AST: 46 IU/L — ABNORMAL HIGH (ref 0–40)
Albumin/Globulin Ratio: 1.8 (ref 1.2–2.2)
Albumin: 4.2 g/dL (ref 3.8–4.9)
Alkaline Phosphatase: 138 IU/L — ABNORMAL HIGH (ref 44–121)
BUN/Creatinine Ratio: 14 (ref 9–20)
BUN: 15 mg/dL (ref 6–24)
Bilirubin Total: 0.7 mg/dL (ref 0.0–1.2)
CO2: 27 mmol/L (ref 20–29)
Calcium: 9.3 mg/dL (ref 8.7–10.2)
Chloride: 101 mmol/L (ref 96–106)
Creatinine, Ser: 1.11 mg/dL (ref 0.76–1.27)
Globulin, Total: 2.3 g/dL (ref 1.5–4.5)
Glucose: 170 mg/dL — ABNORMAL HIGH (ref 65–99)
Potassium: 5.1 mmol/L (ref 3.5–5.2)
Sodium: 142 mmol/L (ref 134–144)
Total Protein: 6.5 g/dL (ref 6.0–8.5)
eGFR: 78 mL/min/{1.73_m2} (ref >59)

## 2021-07-15 LAB — CBC WITH DIFFERENTIAL/PLATELET
Basophils Absolute: 0.1 10*3/uL (ref 0.0–0.2)
Basos: 1 %
EOS (ABSOLUTE): 0.1 10*3/uL (ref 0.0–0.4)
Eos: 1 %
Hematocrit: 46 % (ref 37.5–51.0)
Hemoglobin: 15.7 g/dL (ref 13.0–17.7)
Immature Grans (Abs): 0.1 10*3/uL (ref 0.0–0.1)
Immature Granulocytes: 1 %
Lymphocytes Absolute: 1.7 10*3/uL (ref 0.7–3.1)
Lymphs: 25 %
MCH: 30.7 pg (ref 26.6–33.0)
MCHC: 34.1 g/dL (ref 31.5–35.7)
MCV: 90 fL (ref 79–97)
Monocytes Absolute: 0.6 10*3/uL (ref 0.1–0.9)
Monocytes: 9 %
Neutrophils Absolute: 4.3 10*3/uL (ref 1.4–7.0)
Neutrophils: 63 %
Platelets: 249 10*3/uL (ref 150–450)
RBC: 5.12 x10E6/uL (ref 4.14–5.80)
RDW: 12.4 % (ref 11.6–15.4)
WBC: 6.8 10*3/uL (ref 3.4–10.8)

## 2021-07-15 LAB — LIPID PANEL
Chol/HDL Ratio: 3.1 ratio (ref 0.0–5.0)
Cholesterol, Total: 91 mg/dL — ABNORMAL LOW (ref 100–199)
HDL: 29 mg/dL — ABNORMAL LOW (ref >39)
LDL Chol Calc (NIH): 37 mg/dL (ref 0–99)
Triglycerides: 146 mg/dL (ref 0–149)
VLDL Cholesterol Cal: 25 mg/dL (ref 5–40)

## 2021-07-24 NOTE — Progress Notes (Signed)
Cardiology Office Note  Date: 07/25/2021   ID: [name], DOB [birth year], MRN [id number]  PCP:  Dettinger, Elige Radon, MD  Cardiologist:  Nona Dell, MD Electrophysiologist:  None   Chief Complaint  Patient presents with   Cardiac follow-up    History of Present Illness: [name] is a 55 y.o. male last seen in February.  He is here for a follow-up visit.  Continues to do well without any sense of palpitations, he does not report any exertional chest pain or unusual breathlessness with typical activities.  He continues to follow regularly with PCP and has been working on better glucose control.  I personally reviewed his ECG today which shows normal sinus rhythm with leftward axis.  We went over his medications.  I asked him to stop amiodarone and we will otherwise continue Toprol-XL along with Eliquis.  I reviewed his recent lab work as noted below.  He does not report any spontaneous bleeding problems.  Past Medical History:  Diagnosis Date   Charcot ankle    Essential hypertension    Osteomyelitis of toe of right foot (HCC)    Paroxysmal atrial fibrillation (HCC)    Recurrent boils    Type 2 diabetes mellitus with diabetic neuropathy [facility])    Diagnosed age 60    Past Surgical History:  Procedure Laterality Date   AMPUTATION Right 06/17/2014   Procedure: PARTIAL AMPUTATION RIGHT 3RD TOE;  Surgeon: [name], DPM;  Location: [facility];  Service: Podiatry;  Laterality: Right;   AMPUTATION Right 09/02/2014   Procedure: PARTIAL AMPUTATION 2ND TOE RIGHT FOOT;  Surgeon: [name], DPM;  Location: [facility];  Service: Podiatry;  Laterality: Right;   AMPUTATION Right 04/01/2015   Procedure: AMPUTATION DIGIT 1ST TOE RIGHT FOOT;  Surgeon: Laurell Josephs, DPM;  Location: [facility];  Service: Podiatry;  Laterality: Right;   AMPUTATION Right 06/29/2016   pinkeye    AMPUTATION Right 10/24/2019   Procedure: PARTIAL THIRD RAY AMPUTATION;  Surgeon: Ferman Hamming, DPM;  Location: [facility];  Service: Podiatry;  Laterality: Right;   AMPUTATION Right 02/19/2020   Procedure: AMPUTATION BELOW KNEE; RIGHT;  Surgeon: [name], MD;  Location: [facility];  Service: General;  Laterality: Right;   FOOT AMPUTATION Left    FOOT SURGERY Left 03/2017   HERNIA REPAIR Bilateral    and 3rd hernia repair does not remember ehat side   IRRIGATION AND DEBRIDEMENT ABSCESS Right 11/05/2019   Procedure: IRRIGATION AND DRAINAGE ABSCESS RIGHT ANKLE;  Surgeon: Ferman Hamming, DPM;  Location: [facility];  Service: Podiatry;  Laterality: Right;   TEE WITHOUT CARDIOVERSION N/A 10/29/2019   Procedure: TRANSESOPHAGEAL ECHOCARDIOGRAM (TEE) WITH PROPOFOL;  Surgeon: Pricilla Riffle, MD;  Location: [facility];  Service: Cardiovascular;  Laterality: N/A;    Current Outpatient Medications  Medication Sig Dispense Refill   albuterol (PROVENTIL HFA;VENTOLIN HFA) 108 (90 Base) MCG/ACT inhaler Inhale 2 puffs into the lungs every 6 (six) hours as needed for wheezing or shortness of breath. 1 Inhaler 0   apixaban (ELIQUIS) 5 MG TABS tablet Take 1 tablet (5 mg total) by mouth 2 (two) times daily. 60 tablet 11   atorvastatin (LIPITOR) 20 MG tablet Take 1 tablet (20 mg total) by mouth daily. 90 tablet 3   Continuous Blood Gluc Receiver (FREESTYLE LIBRE 2 READER) DEVI Use as directed to test blood sugar up to 6 times daily as directed. DX: E 11.9 1 each 0   Continuous Blood Gluc  Sensor (FREESTYLE LIBRE 14 DAY SENSOR) MISC Use as directed to test blood sugar up to 6 times daily as directed. DX: E 11.9 6 each 3   DULoxetine (CYMBALTA) 60 MG capsule Take 1 capsule (60 mg total) by mouth daily. 90 capsule 3   fluticasone (FLONASE) 50 MCG/ACT nasal spray Place 1 spray into both nostrils 2 (two) times daily as needed for allergies or rhinitis. 16 g 6   gabapentin (NEURONTIN) 600 MG tablet TAKE 1 TABLET BY MOUTH THREE TIMES A DAY 90 tablet 2   HYDROcodone-acetaminophen (NORCO) 10-325 MG tablet Take  1 tablet by mouth 2 (two) times daily as needed. 60 tablet 0   [START ON 08/12/2021] HYDROcodone-acetaminophen (NORCO) 10-325 MG tablet Take 1 tablet by mouth 2 (two) times daily as needed. 60 tablet 0   [START ON 09/12/2021] HYDROcodone-acetaminophen (NORCO) 10-325 MG tablet Take 1 tablet by mouth 2 (two) times daily as needed. 60 tablet 0   insulin degludec (TRESIBA FLEXTOUCH) 100 UNIT/ML FlexTouch Pen Inject 50-70 Units into the skin daily.     metFORMIN (GLUCOPHAGE) 1000 MG tablet Take 1 tablet (1,000 mg total) by mouth 2 (two) times daily. 180 tablet 3   metoprolol succinate (TOPROL-XL) 25 MG 24 hr tablet TAKE 1 TABLET BY MOUTH EVERY DAY 90 tablet 3   Multiple Vitamins-Minerals (MULTIVITAMIN WITH MINERALS) tablet Take 1 tablet by mouth daily.     Multiple Vitamins-Minerals (THERA-M) TABS Take by mouth.     Semaglutide,0.25 or 0.5MG /DOS, (OZEMPIC, 0.25 OR 0.5 MG/DOSE,) 2 MG/1.5ML SOPN Inject 0.5 mg into the skin once a week. 2 mL 11   No current facility-administered medications for this visit.   Allergies:  Patient has no known allergies.   ROS: No palpitations or syncope.  Physical Exam: VS:  BP (!) 142/82 (BP Location: Right Arm, Patient Position: Sitting, Cuff Size: Normal)   Pulse 69   Ht 6\' 2"  (1.88 m)   Wt 220 lb 12.8 oz (100.2 kg)   SpO2 98%   BMI 28.35 kg/m , BMI Body mass index is 28.35 kg/m.  Wt Readings from Last 3 Encounters:  07/25/21 220 lb 12.8 oz (100.2 kg)  07/14/21 221 lb (100.2 kg)  06/06/21 218 lb 6.4 oz (99.1 kg)    General: Patient appears comfortable at rest. HEENT: Conjunctiva and lids normal, wearing a mask. Neck: Supple, no elevated JVP or carotid bruits, no thyromegaly. Lungs: Clear to auscultation, nonlabored breathing at rest. Cardiac: Regular rate and rhythm, no S3 or significant systolic murmur, no pericardial rub. Extremities: No pitting edema, status post right BKA.  ECG:  An ECG dated 03/29/2020 was personally reviewed today and demonstrated:   Sinus rhythm with poor R wave progression.  Recent Labwork: 12/03/2020: TSH 0.961 07/14/2021: ALT 61; AST 46; BUN 15; Creatinine, Ser 1.11; Hemoglobin 15.7; Platelets 249; Potassium 5.1; Sodium 142     Component Value Date/Time   CHOL 91 (L) 07/14/2021 1042   TRIG 146 07/14/2021 1042   HDL 29 (L) 07/14/2021 1042   CHOLHDL 3.1 07/14/2021 1042   CHOLHDL 4.6 01/30/2018 1347   VLDL 54 (H) 01/30/2018 1347   LDLCALC 37 07/14/2021 1042    Other Studies Reviewed Today:  TEE 10/29/2019:  1. Left ventricular ejection fraction, by visual estimation, is 60 to  65%. The left ventricle has normal function. There is no left ventricular  hypertrophy.   2. The left ventricle has no regional wall motion abnormalities.   3. Global right ventricle has normal systolic function.The right  ventricular size is normal. Right vetricular wall thickness was not  assessed.   4. Left atrial size was normal.   5. Right atrial size was normal.   6. The mitral valve is normal in structure. No evidence of mitral valve  regurgitation.   7. The tricuspid valve is normal in structure.   8. The aortic valve is normal in structure. Aortic valve regurgitation is  not visualized.   9. The pulmonic valve was normal in structure. Pulmonic valve  regurgitation is trivial.   Assessment and Plan:  1.  History of atrial fibrillation with CHA2DS2-VASc score of 5.  Prior episode occurred during sepsis and osteomyelitis, he has had no obvious recurrences.  Plan to continue Eliquis for stroke prophylaxis and Toprol-XL.  Would stop amiodarone.  I reviewed his recent lab work.  He is in sinus rhythm by ECG today.  2.  Mixed hyperlipidemia, on Lipitor with recent LDL 37.  3.  Essential hypertension, systolic is in the 140s today.  He is on Toprol-XL.  Would also be a reasonable candidate for low-dose ARB given type 2 diabetes mellitus, keep follow-up with PCP.  Medication Adjustments/Labs and Tests Ordered: Current medicines  are reviewed at length with the patient today.  Concerns regarding medicines are outlined above.   Tests Ordered: Orders Placed This Encounter  Procedures   EKG 12-Lead     Medication Changes: No orders of the defined types were placed in this encounter.   Disposition:  Follow up  6 months.  Signed, Jonelle Sidle, MD, [facility] 07/25/2021 9:46 AM    [facility] [address] [place], [place], [place] [address] Phone: [phone]; Fax: [phone]

## 2021-07-25 ENCOUNTER — Ambulatory Visit (INDEPENDENT_AMBULATORY_CARE_PROVIDER_SITE_OTHER): Payer: Medicare Other | Admitting: Cardiology

## 2021-07-25 ENCOUNTER — Other Ambulatory Visit: Payer: Self-pay

## 2021-07-25 ENCOUNTER — Encounter: Payer: Self-pay | Admitting: Cardiology

## 2021-07-25 VITALS — BP 142/82 | HR 69 | Ht 74.0 in | Wt 220.8 lb

## 2021-07-25 DIAGNOSIS — I480 Paroxysmal atrial fibrillation: Secondary | ICD-10-CM | POA: Diagnosis not present

## 2021-07-25 DIAGNOSIS — I10 Essential (primary) hypertension: Secondary | ICD-10-CM

## 2021-07-25 DIAGNOSIS — E782 Mixed hyperlipidemia: Secondary | ICD-10-CM

## 2021-07-25 NOTE — Patient Instructions (Signed)
Medication Instructions:  Stop Amiodarone.  Continue all other medications.     Labwork: none  Testing/Procedures: none  Follow-Up: 6 months   Any Other Special Instructions Will Be Listed Below (If Applicable).   If you need a refill on your cardiac medications before your next appointment, please call your pharmacy.

## 2021-07-26 ENCOUNTER — Ambulatory Visit (INDEPENDENT_AMBULATORY_CARE_PROVIDER_SITE_OTHER): Payer: Medicare Other

## 2021-07-26 VITALS — Ht 74.0 in | Wt 220.0 lb

## 2021-07-26 DIAGNOSIS — Z0000 Encounter for general adult medical examination without abnormal findings: Secondary | ICD-10-CM | POA: Diagnosis not present

## 2021-07-26 NOTE — Patient Instructions (Signed)
[name] , Thank you for taking time to come for your Medicare Wellness Visit. I appreciate your ongoing commitment to your health goals. Please review the following plan we discussed and let me know if I can assist you in the future.   Screening recommendations/referrals: Colonoscopy: Appointment with Dr Marina Goodell later this month Recommended yearly ophthalmology/optometry visit for glaucoma screening and checkup Recommended yearly dental visit for hygiene and checkup  Vaccinations: Influenza vaccine: Done 09/01/2020 - Repeat annually Pneumococcal vaccine: Done 04/14/2015 & 07/08/2020  Tdap vaccine: Done 07/08/2020 - Repeat in 10 years Shingles vaccine: Done 05/20/2020 & 06/14/2020   Covid-19: Done 05/20/20 & 06/14/20 - due for booster  Advanced directives: Please bring a copy of your health care power of attorney and living will to the office to be added to your chart at your convenience.   Conditions/risks identified: Aim for 30 minutes of exercise or brisk walking each day, drink 6-8 glasses of water and eat lots of fruits and vegetables.   Next appointment: Follow up in one year for your annual wellness visit   Preventive Care 40-64 Years, Male Preventive care refers to lifestyle choices and visits with your health care provider that can promote health and wellness. What does preventive care include? A yearly physical exam. This is also called an annual well check. Dental exams once or twice a year. Routine eye exams. Ask your health care provider how often you should have your eyes checked. Personal lifestyle choices, including: Daily care of your teeth and gums. Regular physical activity. Eating a healthy diet. Avoiding tobacco and drug use. Limiting alcohol use. Practicing safe sex. Taking low-dose aspirin every day starting at age 37. What happens during an annual well check? The services and screenings done by your health care provider during your annual well check will depend on your  age, overall health, lifestyle risk factors, and family history of disease. Counseling  Your health care provider may ask you questions about your: Alcohol use. Tobacco use. Drug use. Emotional well-being. Home and relationship well-being. Sexual activity. Eating habits. Work and work Astronomer. Screening  You may have the following tests or measurements: Height, weight, and BMI. Blood pressure. Lipid and cholesterol levels. These may be checked every 5 years, or more frequently if you are over 18 years old. Skin check. Lung cancer screening. You may have this screening every year starting at age 58 if you have a 30-pack-year history of smoking and currently smoke or have quit within the past 15 years. Fecal occult blood test (FOBT) of the stool. You may have this test every year starting at age 22. Flexible sigmoidoscopy or colonoscopy. You may have a sigmoidoscopy every 5 years or a colonoscopy every 10 years starting at age 33. Prostate cancer screening. Recommendations will vary depending on your family history and other risks. Hepatitis C blood test. Hepatitis B blood test. Sexually transmitted disease (STD) testing. Diabetes screening. This is done by checking your blood sugar (glucose) after you have not eaten for a while (fasting). You may have this done every 1-3 years. Discuss your test results, treatment options, and if necessary, the need for more tests with your health care provider. Vaccines  Your health care provider may recommend certain vaccines, such as: Influenza vaccine. This is recommended every year. Tetanus, diphtheria, and acellular pertussis (Tdap, Td) vaccine. You may need a Td booster every 10 years. Zoster vaccine. You may need this after age 76. Pneumococcal 13-valent conjugate (PCV13) vaccine. You may need this if  you have certain conditions and have not been vaccinated. Pneumococcal polysaccharide (PPSV23) vaccine. You may need one or two doses if you  smoke cigarettes or if you have certain conditions. Talk to your health care provider about which screenings and vaccines you need and how often you need them. This information is not intended to replace advice given to you by your health care provider. Make sure you discuss any questions you have with your health care provider. Document Released: 11/19/2015 Document Revised: 07/12/2016 Document Reviewed: 08/24/2015 Elsevier Interactive Patient Education  2017 [employer].  Fall Prevention in the Home Falls can cause injuries. They can happen to people of all ages. There are many things you can do to make your home safe and to help prevent falls. What can I do on the outside of my home? Regularly fix the edges of walkways and driveways and fix any cracks. Remove anything that might make you trip as you walk through a door, such as a raised step or threshold. Trim any bushes or trees on the path to your home. Use bright outdoor lighting. Clear any walking paths of anything that might make someone trip, such as rocks or tools. Regularly check to see if handrails are loose or broken. Make sure that both sides of any steps have handrails. Any raised decks and porches should have guardrails on the edges. Have any leaves, snow, or ice cleared regularly. Use sand or salt on walking paths during winter. Clean up any spills in your garage right away. This includes oil or grease spills. What can I do in the bathroom? Use night lights. Install grab bars by the toilet and in the tub and shower. Do not use towel bars as grab bars. Use non-skid mats or decals in the tub or shower. If you need to sit down in the shower, use a plastic, non-slip stool. Keep the floor dry. Clean up any water that spills on the floor as soon as it happens. Remove soap buildup in the tub or shower regularly. Attach bath mats securely with double-sided non-slip rug tape. Do not have throw rugs and other things on the floor  that can make you trip. What can I do in the bedroom? Use night lights. Make sure that you have a light by your bed that is easy to reach. Do not use any sheets or blankets that are too big for your bed. They should not hang down onto the floor. Have a firm chair that has side arms. You can use this for support while you get dressed. Do not have throw rugs and other things on the floor that can make you trip. What can I do in the kitchen? Clean up any spills right away. Avoid walking on wet floors. Keep items that you use a lot in easy-to-reach places. If you need to reach something above you, use a strong step stool that has a grab bar. Keep electrical cords out of the way. Do not use floor polish or wax that makes floors slippery. If you must use wax, use non-skid floor wax. Do not have throw rugs and other things on the floor that can make you trip. What can I do with my stairs? Do not leave any items on the stairs. Make sure that there are handrails on both sides of the stairs and use them. Fix handrails that are broken or loose. Make sure that handrails are as long as the stairways. Check any carpeting to make sure that it is  firmly attached to the stairs. Fix any carpet that is loose or worn. Avoid having throw rugs at the top or bottom of the stairs. If you do have throw rugs, attach them to the floor with carpet tape. Make sure that you have a light switch at the top of the stairs and the bottom of the stairs. If you do not have them, ask someone to add them for you. What else can I do to help prevent falls? Wear shoes that: Do not have high heels. Have rubber bottoms. Are comfortable and fit you well. Are closed at the toe. Do not wear sandals. If you use a stepladder: Make sure that it is fully opened. Do not climb a closed stepladder. Make sure that both sides of the stepladder are locked into place. Ask someone to hold it for you, if possible. Clearly mark and make sure  that you can see: Any grab bars or handrails. First and last steps. Where the edge of each step is. Use tools that help you move around (mobility aids) if they are needed. These include: Canes. Walkers. Scooters. Crutches. Turn on the lights when you go into a dark area. Replace any light bulbs as soon as they burn out. Set up your furniture so you have a clear path. Avoid moving your furniture around. If any of your floors are uneven, fix them. If there are any pets around you, be aware of where they are. Review your medicines with your doctor. Some medicines can make you feel dizzy. This can increase your chance of falling. Ask your doctor what other things that you can do to help prevent falls. This information is not intended to replace advice given to you by your health care provider. Make sure you discuss any questions you have with your health care provider. Document Released: 08/19/2009 Document Revised: 03/30/2016 Document Reviewed: 11/27/2014 Elsevier Interactive Patient Education  2017 [employer].

## 2021-07-26 NOTE — Progress Notes (Signed)
Subjective:   [name] is a 55 y.o. male who presents for an Initial Medicare Annual Wellness Visit.  Virtual Visit via Telephone Note  I connected with  [name] on 07/26/21 at 10:30 AM EDT by telephone and verified that I am speaking with the correct person using two identifiers.  Location: Patient: Home Provider: [facility] Persons participating in the virtual visit: patient/Nurse Health Advisor   I discussed the limitations, risks, security and privacy concerns of performing an evaluation and management service by telephone and the availability of in person appointments. The patient expressed understanding and agreed to proceed.  Interactive audio and video telecommunications were attempted between this nurse and patient, however failed, due to patient having technical difficulties OR patient did not have access to video capability.  We continued and completed visit with audio only.  Some vital signs may be absent or patient reported.   Warden Buffa E Lacora Folmer, LPN   Review of Systems     Cardiac Risk Factors include: advanced age (>65men, >81 women);dyslipidemia;diabetes mellitus;hypertension;male gender;family history of premature cardiovascular disease;sedentary lifestyle;Other (see comment), Risk factor comments: A.Fib     Objective:    Today's Vitals   07/26/21 1011 07/26/21 1012  Weight: 220 lb (99.8 kg)   Height: 6\' 2"  (1.88 m)   PainSc:  0-No pain   Body mass index is 28.25 kg/m.  Advanced Directives 07/26/2021 02/19/2020 02/18/2020 11/05/2019 11/04/2019 10/29/2019 10/24/2019  Does Patient Have a Medical Advance Directive? No No No No No No No  Would patient like information on creating a medical advance directive? No - Patient declined No - Patient declined No - Patient declined - No - Patient declined No - Patient declined No - Patient declined  Pre-existing out of facility DNR order (yellow form or pink MOST form) - - - - - - -    Current Medications  (verified) Outpatient Encounter Medications as of 07/26/2021  Medication Sig   albuterol (PROVENTIL HFA;VENTOLIN HFA) 108 (90 Base) MCG/ACT inhaler Inhale 2 puffs into the lungs every 6 (six) hours as needed for wheezing or shortness of breath.   apixaban (ELIQUIS) 5 MG TABS tablet Take 1 tablet (5 mg total) by mouth 2 (two) times daily.   atorvastatin (LIPITOR) 20 MG tablet Take 1 tablet (20 mg total) by mouth daily.   Continuous Blood Gluc Receiver (FREESTYLE LIBRE 2 READER) DEVI Use as directed to test blood sugar up to 6 times daily as directed. DX: E 11.9   Continuous Blood Gluc Sensor (FREESTYLE LIBRE 14 DAY SENSOR) MISC Use as directed to test blood sugar up to 6 times daily as directed. DX: E 11.9   DULoxetine (CYMBALTA) 60 MG capsule Take 1 capsule (60 mg total) by mouth daily.   fluticasone (FLONASE) 50 MCG/ACT nasal spray Place 1 spray into both nostrils 2 (two) times daily as needed for allergies or rhinitis.   gabapentin (NEURONTIN) 600 MG tablet TAKE 1 TABLET BY MOUTH THREE TIMES A DAY   HYDROcodone-acetaminophen (NORCO) 10-325 MG tablet Take 1 tablet by mouth 2 (two) times daily as needed.   [START ON 08/12/2021] HYDROcodone-acetaminophen (NORCO) 10-325 MG tablet Take 1 tablet by mouth 2 (two) times daily as needed.   [START ON 09/12/2021] HYDROcodone-acetaminophen (NORCO) 10-325 MG tablet Take 1 tablet by mouth 2 (two) times daily as needed.   insulin degludec (TRESIBA FLEXTOUCH) 100 UNIT/ML FlexTouch Pen Inject 50-70 Units into the skin daily.   metFORMIN (GLUCOPHAGE) 1000 MG tablet Take 1 tablet (1,000 mg  total) by mouth 2 (two) times daily.   metoprolol succinate (TOPROL-XL) 25 MG 24 hr tablet TAKE 1 TABLET BY MOUTH EVERY DAY   Multiple Vitamins-Minerals (MULTIVITAMIN WITH MINERALS) tablet Take 1 tablet by mouth daily.   Multiple Vitamins-Minerals (THERA-M) TABS Take by mouth.   Semaglutide,0.25 or 0.5MG /DOS, (OZEMPIC, 0.25 OR 0.5 MG/DOSE,) 2 MG/1.5ML SOPN Inject 0.5 mg into the  skin once a week.   No facility-administered encounter medications on file as of 07/26/2021.    Allergies (verified) Patient has no known allergies.   History: Past Medical History:  Diagnosis Date   Charcot ankle    Essential hypertension    Osteomyelitis of toe of right foot (HCC)    Paroxysmal atrial fibrillation (HCC)    Recurrent boils    Type 2 diabetes mellitus with diabetic neuropathy [facility])    Diagnosed age 55   Past Surgical History:  Procedure Laterality Date   AMPUTATION Right 06/17/2014   Procedure: PARTIAL AMPUTATION RIGHT 3RD TOE;  Surgeon: [name], DPM;  Location: [facility];  Service: Podiatry;  Laterality: Right;   AMPUTATION Right 09/02/2014   Procedure: PARTIAL AMPUTATION 2ND TOE RIGHT FOOT;  Surgeon: [name], DPM;  Location: [facility];  Service: Podiatry;  Laterality: Right;   AMPUTATION Right 04/01/2015   Procedure: AMPUTATION DIGIT 1ST TOE RIGHT FOOT;  Surgeon: Laurell Josephs, DPM;  Location: [facility];  Service: Podiatry;  Laterality: Right;   AMPUTATION Right 06/29/2016   pinkeye    AMPUTATION Right 10/24/2019   Procedure: PARTIAL THIRD RAY AMPUTATION;  Surgeon: Ferman Hamming, DPM;  Location: [facility];  Service: Podiatry;  Laterality: Right;   AMPUTATION Right 02/19/2020   Procedure: AMPUTATION BELOW KNEE; RIGHT;  Surgeon: [name], MD;  Location: [facility];  Service: General;  Laterality: Right;   FOOT AMPUTATION Left    FOOT SURGERY Left 03/2017   HERNIA REPAIR Bilateral    and 3rd hernia repair does not remember ehat side   IRRIGATION AND DEBRIDEMENT ABSCESS Right 11/05/2019   Procedure: IRRIGATION AND DRAINAGE ABSCESS RIGHT ANKLE;  Surgeon: Ferman Hamming, DPM;  Location: [facility];  Service: Podiatry;  Laterality: Right;   TEE WITHOUT CARDIOVERSION N/A 10/29/2019   Procedure: TRANSESOPHAGEAL ECHOCARDIOGRAM (TEE) WITH PROPOFOL;  Surgeon: Pricilla Riffle, MD;  [facility];  Service: Cardiovascular;  Laterality: N/A;    Family History  Problem Relation Age of Onset   Stroke Father    Asthma Father    COPD Father    Aneurysm Father        AAA   Diabetes Brother    Stroke Brother        2008   Diabetes Mother    Social History   Socioeconomic History   Marital status: Significant Other    Spouse name: Not on file   Number of children: 1   Years of education: Not on file   Highest education level: Not on file  Occupational History   Occupation: Receiving  Tobacco Use   Smoking status: Never   Smokeless tobacco: Never  Vaping Use   Vaping Use: Never used  Substance and Sexual Activity   Alcohol use: No   Drug use: No   Sexual activity: Not on file  Other Topics Concern   Not on file  Social History Narrative   Has a significant other - lives alone - son lives in [place]   Social Determinants of Health   Financial Resource Strain: Medium Risk   Difficulty  of Paying Living Expenses: Somewhat hard  Food Insecurity: Food Insecurity Present   Worried About Running Out of Food in the Last Year: Sometimes true   Ran Out of Food in the Last Year: Never true  Transportation Needs: No Transportation Needs   Lack of Transportation (Medical): No   Lack of Transportation (Non-Medical): No  Physical Activity: Sufficiently Active   Days of Exercise per Week: 7 days   Minutes of Exercise per Session: 30 min  Stress: No Stress Concern Present   Feeling of Stress : Not at all  Social Connections: Moderately Integrated   Frequency of Communication with Friends and Family: More than three times a week   Frequency of Social Gatherings with Friends and Family: More than three times a week   Attends Religious Services: More than 4 times per year   Active Member of [employer] or Organizations: Yes   Attends Banker Meetings: 1 to 4 times per year   Marital Status: Divorced    Tobacco Counseling Counseling given: Not Answered   Clinical Intake:  Pre-visit preparation completed:  Yes  Pain : No/denies pain Pain Score: 0-No pain     BMI - recorded: 28.25 Nutritional Status: BMI 25 -29 Overweight Nutritional Risks: None Diabetes: Yes CBG done?: No Did pt. bring in CBG monitor from home?: No  How often do you need to have someone help you when you read instructions, pamphlets, or other written materials from your doctor or pharmacy?: 1 - Never  Diabetic? Nutrition Risk Assessment:  Has the patient had any N/V/D within the last 2 months?  No  Does the patient have any non-healing wounds?  No  Has the patient had any unintentional weight loss or weight gain?  No   Diabetes:  Is the patient diabetic?  Yes  If diabetic, was a CBG obtained today?  No  Did the patient bring in their glucometer from home?  No  How often do you monitor your CBG's? Has a CGM - 180 fasting this am per patient.   Financial Strains and Diabetes Management:  Are you having any financial strains with the device, your supplies or your medication? No .  Does the patient want to be seen by Chronic Care Management for management of their diabetes?  No  Would the patient like to be referred to a Nutritionist or for Diabetic Management?  No   Diabetic Exams:  Diabetic Eye Exam: Completed 08/18/2020.   Diabetic Foot Exam: Completed 07/22/2019. Pt has been advised about the importance in completing this exam. Pt is scheduled for diabetic foot exam on 08/04/21.    Interpreter Needed?: No  Information entered by :: Donasia Wimes, LPN   Activities of Daily Living In your present state of health, do you have any difficulty performing the following activities: 07/26/2021  Hearing? N  Vision? N  Difficulty concentrating or making decisions? N  Walking or climbing stairs? Y  Dressing or bathing? N  Doing errands, shopping? N  Preparing Food and eating ? N  Using the Toilet? N  In the past six months, have you accidently leaked urine? N  Do you have problems with loss of bowel control? N   Managing your Medications? N  Managing your Finances? N  Housekeeping or managing your Housekeeping? N  Some recent data might be hidden    Patient Care Team: Dettinger, Elige Radon, MD as PCP - General (Family Medicine) Jonelle Sidle, MD as PCP - Cardiology (Cardiology) Joelyn Oms,  MD as Referring Physician (Orthopedic Surgery) Danella Maiers, [facility] (Pharmacist)  Indicate any recent Medical Services you may have received from other than [facility] providers in the past year (date may be approximate).     Assessment:   This is a routine wellness examination for [name].  Hearing/Vision screen Hearing Screening - Comments:: Denies hearing difficulties  Vision Screening - Comments:: Wears eyeglasses - up to date with annual exams with [facility]  Dietary issues and exercise activities discussed: Current Exercise Habits: Home exercise routine, Type of exercise: walking;stretching, Time (Minutes): 30, Frequency (Times/Week): 7, Weekly Exercise (Minutes/Week): 210, Intensity: Mild, Exercise limited by: orthopedic condition(s);cardiac condition(s)   Goals Addressed               This Visit's Progress     T2DM (pt-stated)   On track     Current Barriers:  Unable to independently afford treatment regimen Unable to maintain control of T2DM   Pharmacist Clinical Goal(s):  Over the next 90 days, patient will verbalize ability to afford treatment regimen maintain control of T2DM as evidenced by IMPROVED GLYCEMIC CONTROL  through collaboration with PharmD and provider.    Interventions: 1:1 collaboration with Dettinger, Elige Radon, MD regarding development and update of comprehensive plan of care as evidenced by provider attestation and co-signature Inter-disciplinary care team collaboration (see longitudinal plan of care) Comprehensive medication review performed; medication list updated in electronic medical record  Diabetes: Uncontrolled (A1C 7.5%); current  treatment:TRESIBA 60 UNITS DAILY, OZEMIPIC 0.5MG  SQ WEEKLY;  ENROLLED IN [facility] PATIENT ASSISTANCE PROGRAM (PREVIOUSLY WITH HEALTH DEPT) USING ANYWHERE FROM 60-70 UNITS OF TRESIBA USES LIBRE CGM--NEED TO CLARIFY 14 DAY VS 2 SYSTEM (CALLED IN DIFFERENTLY) DISCUSSED INCREASING OZEMPIC TO  AT F/U IF NOT CONTROLLED Denies personal and family history of Medullary thyroid cancer (MTC) Current glucose readings: fasting glucose: <150, post prandial glucose: <200 Denies hypoglycemic/hyperglycemic symptoms Discussed meal planning options and Plate method for healthy eating Avoid sugary drinks and desserts Incorporate balanced protein, non starchy veggies, 1 serving of carbohydrate with each meal Increase water intake Increase physical activity as able Current exercise: N/A Recommended INCREASE IN GLP1 (OZEMPIC) Assessed patient finances. ENROLLED PATIENT IN [facility] PATIENT ASSISTANCE   Patient Goals/Self-Care Activities Over the next 90 days, patient will:  - take medications as prescribed check glucose USING LIBRE CGM, document, and provide at future appointments collaborate with provider on medication access solutions  Follow Up Plan: Telephone follow up appointment with care management team member scheduled for: 1 MONTH        Depression Screen PHQ 2/9 Scores 07/26/2021 03/31/2021 03/03/2021 12/27/2020 12/03/2020 11/29/2020 10/20/2020  PHQ - 2 Score 0 0 0 0 0 0 0  PHQ- 9 Score - 0 - - - - -    Fall Risk Fall Risk  07/26/2021 07/14/2021 06/06/2021 03/31/2021 03/03/2021  Falls in the past year? 0  Number falls in past yr: -  Injury with Fall? 0 -  Risk for fall due to : History of fall(s);Impaired balance/gait;Medication side effect;Orthopedic patient History of fall(s);Impaired balance/gait;Orthopedic patient Other (Comment) History of fall(s) -  Follow up Education provided;Falls prevention discussed Falls evaluation completed Education provided Education  provided -    FALL RISK PREVENTION PERTAINING TO THE HOME:  Any stairs in or around the home? Yes  If so, are there any without handrails? No  Home free of loose throw rugs in walkways, pet beds, electrical cords, etc?  Yes  Adequate lighting in your home to reduce risk of falls? Yes   ASSISTIVE DEVICES UTILIZED TO PREVENT FALLS:  Life alert? No  Use of a cane, walker or w/c? No  Grab bars in the bathroom? Yes  Shower chair or bench in shower? Yes  Elevated toilet seat or a handicapped toilet? Yes   TIMED UP AND GO:  Was the test performed? No . Telephonic visit  Cognitive Function: Normal cognitive status assessed by direct observation by this Nurse Health Advisor. No abnormalities found.    MMSE - Mini Mental State Exam 07/08/2020  Orientation to time 5  Orientation to Place 5  Registration 3  Attention/ Calculation 5  Recall 3  Language- name 2 objects 2  Language- repeat 1  Language- follow 3 step command 3  Language- read & follow direction 1  Write a sentence 1  Copy design 1  Total score 30        Immunizations Immunization History  Administered Date(s) Administered   Influenza, Seasonal, Injecte, Preservative Fre 10/15/2015, 08/05/2016   Influenza,inj,Quad PF,6+ Mos 10/15/2015, 08/05/2016, 08/21/2017, 08/07/2018, 07/22/2019, 09/01/2020   Moderna Sars-Covid-2 Vaccination 05/20/2020, 06/14/2020   Pneumococcal Conjugate-13 07/22/2019, 07/08/2020   Tdap 07/08/2020   Zoster Recombinat (Shingrix) 06/30/2019, 10/06/2019    TDAP status: Up to date  Flu Vaccine status: Due, Education has been provided regarding the importance of this vaccine. Advised may receive this vaccine at local pharmacy or Health Dept. Aware to provide a copy of the vaccination record if obtained from local pharmacy or Health Dept. Verbalized acceptance and understanding.  Pneumococcal vaccine status: Up to date  Covid-19 vaccine status: Information provided on how to obtain vaccines.    Qualifies for Shingles Vaccine? Yes   Zostavax completed Yes   Shingrix Completed?: Yes  Screening Tests Health Maintenance  Topic Date Due   URINE MICROALBUMIN  04/23/2021   COVID-19 Vaccine (3 - Booster for Moderna series) 08/29/2021 (Originally 11/14/2020)   COLON CANCER SCREENING ANNUAL FOBT  09/01/2021 (Originally 03/05/2019)   INFLUENZA VACCINE  09/12/2021 (Originally 06/06/2021)   COLONOSCOPY (Pts 45-62yrs Insurance coverage will need to be confirmed)  10/17/2021 (Originally 08/23/2009)   OPHTHALMOLOGY EXAM  08/18/2021   HEMOGLOBIN A1C  01/11/2022   TETANUS/TDAP  07/08/2030   Hepatitis C Screening  Completed   HIV Screening  Completed   Zoster Vaccines- Shingrix  Completed   HPV VACCINES  Aged Out   FOOT EXAM  Discontinued    Health Maintenance  Health Maintenance Due  Topic Date Due   URINE MICROALBUMIN  04/23/2021    Colorectal cancer screening: Referral to GI placed 07/2021. Pt aware the office will call re: appt. He has an appt later this month with Dr Marina Goodell  Lung Cancer Screening: (Low Dose CT Chest recommended if Age 61-80 years, 30 pack-year currently smoking OR have quit w/in 15years.) does not qualify.   Additional Screening:  Hepatitis C Screening: does qualify; Completed 06/19/2016  Vision Screening: Recommended annual ophthalmology exams for early detection of glaucoma and other disorders of the eye. Is the patient up to date with their annual eye exam?  Yes  Who is the provider or what is the name of the office in which the patient attends annual eye exams? [facility] If pt is not established with a provider, would they like to be referred to a provider to establish care? No .   Dental Screening: Recommended annual dental exams for proper oral hygiene  Community Resource Referral / Chronic Care  Management: CRR required this visit?  No   CCM required this visit?  No      Plan:     I have personally reviewed and noted the following in the  patient's chart:   Medical and social history Use of alcohol, tobacco or illicit drugs  Current medications and supplements including opioid prescriptions. Patient is currently taking opioid prescriptions. Information provided to patient regarding non-opioid alternatives. Patient advised to discuss non-opioid treatment plan with their provider. Functional ability and status Nutritional status Physical activity Advanced directives List of other physicians Hospitalizations, surgeries, and ER visits in previous 12 months Vitals Screenings to include cognitive, depression, and falls Referrals and appointments  In addition, I have reviewed and discussed with patient certain preventive protocols, quality metrics, and best practice recommendations. A written personalized care plan for preventive services as well as general preventive health recommendations were provided to patient.     [place] Constable, LPN   1/61/0960   Nurse Notes: He is concerned about the medications he is and has taken - worried one may have caused his amputation - requested appointment to discuss with Dr Dettinger. Appt made.

## 2021-07-29 ENCOUNTER — Telehealth: Payer: Medicare Other

## 2021-08-02 ENCOUNTER — Ambulatory Visit (AMBULATORY_SURGERY_CENTER): Payer: Medicare Other | Admitting: Internal Medicine

## 2021-08-02 ENCOUNTER — Other Ambulatory Visit: Payer: Self-pay

## 2021-08-02 ENCOUNTER — Encounter: Payer: Self-pay | Admitting: Internal Medicine

## 2021-08-02 VITALS — BP 129/79 | HR 72 | Temp 97.3°F | Resp 17 | Ht 74.0 in | Wt 217.0 lb

## 2021-08-02 DIAGNOSIS — Z1211 Encounter for screening for malignant neoplasm of colon: Secondary | ICD-10-CM

## 2021-08-02 MED ORDER — SODIUM CHLORIDE 0.9 % IV SOLN: 500.0000 mL | Freq: Once | INTRAVENOUS | Status: DC

## 2021-08-02 NOTE — Progress Notes (Signed)
HISTORY OF PRESENT ILLNESS:   [name] is a pleasant 55 y.o. 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  He presents today regarding routine screening colonoscopy at the urging of his primary provider.  He is accompanied by his girlfriend.  His GI review of systems is negative except for occasional loose stools.  No bleeding.  No family history of colon cancer.  For his diabetes he takes Protonix and an oral agent.  Echocardiogram shows an ejection fraction of 60 to 65%.  Review of blood work January 2022 shows unremarkable comprehensive metabolic panel and CBC.  Hemoglobin 16.3.   REVIEW OF SYSTEMS:   All non-GI ROS negative unless otherwise stated in the HPI.       Past Medical History:  Diagnosis Date   Charcot ankle     Essential hypertension     Osteomyelitis of toe of right foot (HCC)     Paroxysmal atrial fibrillation (HCC)     Recurrent boils     Type 2 diabetes mellitus with diabetic neuropathy [facility])      Diagnosed age 33           Past Surgical History:  Procedure Laterality Date   AMPUTATION Right 06/17/2014    Procedure: PARTIAL AMPUTATION RIGHT 3RD TOE;  Surgeon: [name], DPM;  Location: [facility];  Service: Podiatry;  Laterality: Right;   AMPUTATION Right 09/02/2014    Procedure: PARTIAL AMPUTATION 2ND TOE RIGHT FOOT;  Surgeon: [name], DPM;  Location: [facility];  Service: Podiatry;  Laterality: Right;   AMPUTATION Right 04/01/2015    Procedure: AMPUTATION DIGIT 1ST TOE RIGHT FOOT;  Surgeon: Laurell Josephs, DPM;  Location: [facility];  Service: Podiatry;  Laterality: Right;   AMPUTATION Right 06/29/2016    pinkeye    AMPUTATION Right 10/24/2019    Procedure: PARTIAL THIRD RAY AMPUTATION;  Surgeon: Ferman Hamming, DPM;  Location: [facility];  Service: Podiatry;  Laterality: Right;   AMPUTATION Right 02/19/2020     Procedure: AMPUTATION BELOW KNEE; RIGHT;  Surgeon: [name], MD;  Location: [facility];  Service: General;  Laterality: Right;   FOOT AMPUTATION Left     FOOT SURGERY Left 03/2017   HERNIA REPAIR Bilateral      and 3rd hernia repair does not remember ehat side   IRRIGATION AND DEBRIDEMENT ABSCESS Right 11/05/2019    Procedure: IRRIGATION AND DRAINAGE ABSCESS RIGHT ANKLE;  Surgeon: Ferman Hamming, DPM;  Location: [facility];  Service: Podiatry;  Laterality: Right;   TEE WITHOUT CARDIOVERSION N/A 10/29/2019    Procedure: TRANSESOPHAGEAL ECHOCARDIOGRAM (TEE) WITH PROPOFOL;  Surgeon: Pricilla Riffle, MD;  [facility];  Service: Cardiovascular;  Laterality: N/A;      Social History [name]  reports that he has never smoked. He has never used smokeless tobacco. He reports that he does not drink alcohol and does not use drugs.   family history includes Aneurysm in his father; Asthma in his father; COPD in his father; Diabetes in his brother and mother; Stroke in his brother and father.   No Known Allergies       PHYSICAL EXAMINATION: Vital signs: BP (!) 156/88   Pulse 84   Ht 6\' 2"  (1.88 m)   Wt 217 lb (98.4 kg)   BMI 27.86 kg/m   Constitutional: generally well-appearing, no acute distress Psychiatric: alert and oriented x3, cooperative Eyes:  extraocular movements intact, anicteric, conjunctiva pink Mouth: oral pharynx moist, no lesions Neck: supple no lymphadenopathy Cardiovascular: heart regular rate and rhythm, no murmur Lungs: clear to auscultation bilaterally Abdomen: soft, nontender, nondistended, no obvious ascites, no peritoneal signs, normal bowel sounds, no organomegaly Rectal: Deferred until colonoscopy Extremities: Status post BKA Skin: no lesions on visible extremities Neuro: No focal deficits.  Cranial nerves intact   ASSESSMENT:   1.  Screening colonoscopy.  Baseline for neoplasia 2.  Diabetes mellitus 3.  Status post BKA 4.  History of atrial  fibrillation on Eliquis.     PLAN:   1.  Colonoscopy.  The patient is high risk given his comorbidities, the need to address anticoagulation, and adjust diabetic medications.The nature of the procedure, as well as the risks, benefits, and alternatives were carefully and thoroughly reviewed with the patient. Ample time for discussion and questions allowed. The patient understood, was satisfied, and agreed to proceed.  2.  Hold oral agent the day of the procedure and give one half of DM insulin.   3.  Hold Eliquis 2 days prior to the procedure.  Would anticipate resumption immediately post procedure.  We will confer with his cardiologist Dr. Diona Browner to see if this is acceptable  The patient was seen in the office April 18, 2021 regarding screening colonoscopy.  Above is the complete history and physical examination from that encounter.  There have been no interval changes clinical status.  He is now for colonoscopy.  Wilhemina Bonito. Eda Keys., M.D. [facility] Division of Gastroenterology

## 2021-08-02 NOTE — Progress Notes (Signed)
Sedate, gd SR, tolerated procedure well, VSS, report to RN 

## 2021-08-02 NOTE — Patient Instructions (Signed)
Repeat colonoscopy in 1 year with more extensive prep. My office will contact you to schedule this appointment.   YOU HAD AN ENDOSCOPIC PROCEDURE TODAY AT THE  ENDOSCOPY CENTER:   Refer to the procedure report that was given to you for any specific questions about what was found during the examination.  If the procedure report does not answer your questions, please call your gastroenterologist to clarify.  If you requested that your care partner not be given the details of your procedure findings, then the procedure report has been included in a sealed envelope for you to review at your convenience later.  YOU SHOULD EXPECT: Some feelings of bloating in the abdomen. Passage of more gas than usual.  Walking can help get rid of the air that was put into your GI tract during the procedure and reduce the bloating. If you had a lower endoscopy (such as a colonoscopy or flexible sigmoidoscopy) you may notice spotting of blood in your stool or on the toilet paper. If you underwent a bowel prep for your procedure, you may not have a normal bowel movement for a few days.  Please Note:  You might notice some irritation and congestion in your nose or some drainage.  This is from the oxygen used during your procedure.  There is no need for concern and it should clear up in a day or so.  SYMPTOMS TO REPORT IMMEDIATELY:  Following lower endoscopy (colonoscopy or flexible sigmoidoscopy):  Excessive amounts of blood in the stool  Significant tenderness or worsening of abdominal pains  Swelling of the abdomen that is new, acute  Fever of 100F or higher  For urgent or emergent issues, a gastroenterologist can be reached at any hour by calling [phone]. Do not use MyChart messaging for urgent concerns.    DIET:  We do recommend a small meal at first, but then you may proceed to your regular diet.  Drink plenty of fluids but you should avoid alcoholic beverages for 24 hours.  ACTIVITY:  You should  plan to take it easy for the rest of today and you should NOT DRIVE or use heavy machinery until tomorrow (because of the sedation medicines used during the test).    FOLLOW UP: Our staff will call the number listed on your records 48-72 hours following your procedure to check on you and address any questions or concerns that you may have regarding the information given to you following your procedure. If we do not reach you, we will leave a message.  We will attempt to reach you two times.  During this call, we will ask if you have developed any symptoms of COVID 19. If you develop any symptoms (ie: fever, flu-like symptoms, shortness of breath, cough etc.) before then, please call [phone].  If you test positive for Covid 19 in the 2 weeks post procedure, please call and report this information to [place].    If any biopsies were taken you will be contacted by phone or by letter within the next 1-3 weeks.  Please call us at [phone] if you have not heard about the biopsies in 3 weeks.    SIGNATURES/CONFIDENTIALITY: You and/or your care partner have signed paperwork which will be entered into your electronic medical record.  These signatures attest to the fact that that the information above on your After Visit Summary has been reviewed and is understood.  Full responsibility of the confidentiality of this discharge information lies with you and/or your care-partner.

## 2021-08-02 NOTE — Op Note (Signed)
[facility] Patient Name: [name] Procedure Date: 08/02/2021 11:10 AM MRN: [id number] Endoscopist: Wilhemina Bonito. Marina Goodell , MD Age: 55 Referring MD:  Date of Birth: 22-May-1966 Gender: Male Account #: [id number] Procedure:                Colonoscopy Indications:              Screening for colorectal malignant neoplasm Medicines:                Monitored Anesthesia Care Procedure:                Pre-Anesthesia Assessment:                           - Prior to the procedure, a History and Physical                            was performed, and patient medications and                            allergies were reviewed. The patient's tolerance of                            previous anesthesia was also reviewed. The risks                            and benefits of the procedure and the sedation                            options and risks were discussed with the patient.                            All questions were answered, and informed consent                            was obtained. Prior Anticoagulants: The patient has                            taken Eliquis (apixaban), last dose was 2 days                            prior to procedure. ASA Grade Assessment: III - A                            patient with severe systemic disease. After                            reviewing the risks and benefits, the patient was                            deemed in satisfactory condition to undergo the                            procedure.  After obtaining informed consent, the colonoscope                            was passed under direct vision. Throughout the                            procedure, the patient's blood pressure, pulse, and                            oxygen saturations were monitored continuously. The                            CF [id number] #[id number] was introduced through the anus                            and advanced to the the cecum, identified by                             appendiceal orifice and ileocecal valve. The                            ileocecal valve, appendiceal orifice, and rectum                            were photographed. The quality of the bowel                            preparation was poor. The colonoscopy was performed                            without difficulty. The patient tolerated the                            procedure well. The bowel preparation used was                            SUPREP via split dose instruction. Scope In: 12:01:38 PM Scope Out: 12:10:21 PM Scope Withdrawal Time: 0 hours 2 minutes 33 seconds  Total Procedure Duration: 0 hours 8 minutes 43 seconds  Findings:                 The entire examined colon appeared normal on direct                            and retroflexion views. However, the examination                            was severely compromised by poor preparation. Can                            only exclude lumen occluding masses. Complications:            No immediate complications. Estimated blood loss:  None. Estimated Blood Loss:     Estimated blood loss: none. Impression:               - Preparation of the colon was poor.                           - The entire examined colon revealed no gross                            abnormalities on direct and retroflexion views.                           - Examination was compromised by poor preparation. Recommendation:           - Repeat colonoscopy in 1 year for screening                            purposes. MORE EXTENSIVE PREPARATION.                           - Patient has a contact number available for                            emergencies. The signs and symptoms of potential                            delayed complications were discussed with the                            patient. Return to normal activities tomorrow.                            Written discharge instructions were provided to the                             patient.                           - Resume previous diet.                           - Continue present medications. Wilhemina Bonito. Marina Goodell, MD 08/02/2021 12:15:54 PM This report has been signed electronically.

## 2021-08-04 ENCOUNTER — Telehealth: Payer: Self-pay

## 2021-08-04 ENCOUNTER — Encounter: Payer: Self-pay | Admitting: Family Medicine

## 2021-08-04 ENCOUNTER — Ambulatory Visit (INDEPENDENT_AMBULATORY_CARE_PROVIDER_SITE_OTHER): Payer: Medicare Other | Admitting: Family Medicine

## 2021-08-04 DIAGNOSIS — L03221 Cellulitis of neck: Secondary | ICD-10-CM | POA: Diagnosis not present

## 2021-08-04 DIAGNOSIS — L0211 Cutaneous abscess of neck: Secondary | ICD-10-CM | POA: Diagnosis not present

## 2021-08-04 MED ORDER — CEPHALEXIN 500 MG PO CAPS: 500.0000 mg | ORAL_CAPSULE | Freq: Four times a day (QID) | ORAL | 0 refills | Status: DC

## 2021-08-04 NOTE — Telephone Encounter (Signed)
  Follow up Call-  Call back number 08/02/2021  Post procedure Call Back phone  # [phone]  Permission to leave phone message Yes  Some recent data might be hidden     Patient questions:  Do you have a fever, pain , or abdominal swelling? No. Pain Score  0 *  Have you tolerated food without any problems? Yes.    Have you been able to return to your normal activities? Yes.    Do you have any questions about your discharge instructions: Diet   No. Medications  No. Follow up visit  No.  Do you have questions or concerns about your Care? No.  Actions: * If pain score is 4 or above: No action needed, pain <4.  Have you developed a fever since your procedure? no  2.   Have you had an respiratory symptoms (SOB or cough) since your procedure? no  3.   Have you tested positive for COVID 19 since your procedure no  4.   Have you had any family members/close contacts diagnosed with the COVID 19 since your procedure?  no   If yes to any of these questions please route to Laverna Peace, RN and Karlton Lemon, RN

## 2021-08-04 NOTE — Telephone Encounter (Signed)
No answer, left message to call back later today, B.Marcelle Bebout RN. 

## 2021-08-04 NOTE — Progress Notes (Signed)
Virtual Visit via telephone Note  I connected with [name] on 08/04/21 at 0857 by telephone and verified that I am speaking with the correct person using two identifiers. HELDER CRISAFULLI is currently located at home and patient are currently with her during visit. The provider, Elige Radon Larri Brewton, MD is located in their office at time of visit.  Call ended at 0905  I discussed the limitations, risks, security and privacy concerns of performing an evaluation and management service by telephone and the availability of in person appointments. I also discussed with the patient that there may be a patient responsible charge related to this service. The patient expressed understanding and agreed to proceed.   History and Present Illness: Patient has a spot on his neck that popped up last week and one on cheek 2 days ago.  He denies fevers or chills. He has drainage from them that is purulent. He has a history of these in the past with his diabetes. He is using peroxide and acne medicine.   1. Cellulitis and abscess of neck     Outpatient Encounter Medications as of 08/04/2021  Medication Sig   cephALEXin (KEFLEX) 500 MG capsule Take 1 capsule (500 mg total) by mouth 4 (four) times daily.   albuterol (PROVENTIL HFA;VENTOLIN HFA) 108 (90 Base) MCG/ACT inhaler Inhale 2 puffs into the lungs every 6 (six) hours as needed for wheezing or shortness of breath.   apixaban (ELIQUIS) 5 MG TABS tablet Take 1 tablet (5 mg total) by mouth 2 (two) times daily.   atorvastatin (LIPITOR) 20 MG tablet Take 1 tablet (20 mg total) by mouth daily.   Continuous Blood Gluc Receiver (FREESTYLE LIBRE 2 READER) DEVI Use as directed to test blood sugar up to 6 times daily as directed. DX: E 11.9   Continuous Blood Gluc Sensor (FREESTYLE LIBRE 14 DAY SENSOR) MISC Use as directed to test blood sugar up to 6 times daily as directed. DX: E 11.9   DULoxetine (CYMBALTA) 60 MG capsule Take 1 capsule (60 mg total) by mouth  daily.   fluticasone (FLONASE) 50 MCG/ACT nasal spray Place 1 spray into both nostrils 2 (two) times daily as needed for allergies or rhinitis.   gabapentin (NEURONTIN) 600 MG tablet TAKE 1 TABLET BY MOUTH THREE TIMES A DAY   HYDROcodone-acetaminophen (NORCO) 10-325 MG tablet Take 1 tablet by mouth 2 (two) times daily as needed.   [START ON 08/12/2021] HYDROcodone-acetaminophen (NORCO) 10-325 MG tablet Take 1 tablet by mouth 2 (two) times daily as needed.   [START ON 09/12/2021] HYDROcodone-acetaminophen (NORCO) 10-325 MG tablet Take 1 tablet by mouth 2 (two) times daily as needed.   insulin degludec (TRESIBA FLEXTOUCH) 100 UNIT/ML FlexTouch Pen Inject 50-70 Units into the skin daily.   metFORMIN (GLUCOPHAGE) 1000 MG tablet Take 1 tablet (1,000 mg total) by mouth 2 (two) times daily.   metoprolol succinate (TOPROL-XL) 25 MG 24 hr tablet TAKE 1 TABLET BY MOUTH EVERY DAY   Multiple Vitamins-Minerals (MULTIVITAMIN WITH MINERALS) tablet Take 1 tablet by mouth daily.   Multiple Vitamins-Minerals (THERA-M) TABS Take by mouth.   Semaglutide,0.25 or 0.5MG /DOS, (OZEMPIC, 0.25 OR 0.5 MG/DOSE,) 2 MG/1.5ML SOPN Inject 0.5 mg into the skin once a week.   No facility-administered encounter medications on file as of 08/04/2021.    Review of Systems  Constitutional:  Negative for chills and fever.  Respiratory:  Negative for shortness of breath and wheezing.   Cardiovascular:  Negative for chest pain and leg  swelling.  Skin:  Positive for color change and rash.  All other systems reviewed and are negative.  Observations/Objective: Patient sounds comfortable and in no acute distress  Assessment and Plan: Problem List Items Addressed This Visit   None Visit Diagnoses     Cellulitis and abscess of neck    -  Primary   Relevant Medications   cephALEXin (KEFLEX) 500 MG capsule       Sounds like he is starting to get more abscesses but all are small according to him, will treat with antibiotic and  instructions to return if anything gets larger and needs to be lanced. Follow up plan: Return if symptoms worsen or fail to improve.     I discussed the assessment and treatment plan with the patient. The patient was provided an opportunity to ask questions and all were answered. The patient agreed with the plan and demonstrated an understanding of the instructions.   The patient was advised to call back or seek an in-person evaluation if the symptoms worsen or if the condition fails to improve as anticipated.  The above assessment and management plan was discussed with the patient. The patient verbalized understanding of and has agreed to the management plan. Patient is aware to call the clinic if symptoms persist or worsen. Patient is aware when to return to the clinic for a follow-up visit. Patient educated on when it is appropriate to go to the emergency department.    I provided 8 minutes of non-face-to-face time during this encounter.    Nils Pyle, MD

## 2021-08-05 DIAGNOSIS — Z794 Long term (current) use of insulin: Secondary | ICD-10-CM | POA: Diagnosis not present

## 2021-08-05 DIAGNOSIS — E1165 Type 2 diabetes mellitus with hyperglycemia: Secondary | ICD-10-CM | POA: Diagnosis not present

## 2021-08-08 ENCOUNTER — Ambulatory Visit: Payer: Medicare Other

## 2021-08-09 ENCOUNTER — Telehealth: Payer: Medicare Other

## 2021-08-12 ENCOUNTER — Other Ambulatory Visit: Payer: Self-pay | Admitting: Family Medicine

## 2021-08-12 DIAGNOSIS — E1142 Type 2 diabetes mellitus with diabetic polyneuropathy: Secondary | ICD-10-CM

## 2021-08-16 ENCOUNTER — Telehealth: Payer: Self-pay | Admitting: Family Medicine

## 2021-08-16 NOTE — Telephone Encounter (Signed)
Advised patient to bring proof of income to [facility] to re enroll in prescription assistance. Per [facility].

## 2021-08-16 NOTE — Telephone Encounter (Signed)
Pt is out of ozempic and would like to know if we have any samples. Please call back.

## 2021-08-23 ENCOUNTER — Ambulatory Visit: Payer: Medicare Other | Admitting: Pharmacist

## 2021-08-30 ENCOUNTER — Ambulatory Visit (INDEPENDENT_AMBULATORY_CARE_PROVIDER_SITE_OTHER): Payer: Medicare Other | Admitting: Pharmacist

## 2021-08-30 DIAGNOSIS — E1122 Type 2 diabetes mellitus with diabetic chronic kidney disease: Secondary | ICD-10-CM

## 2021-08-30 NOTE — Progress Notes (Signed)
Chronic Care Management Pharmacy Note  08/30/2021 Name:  [name] MRN:  [id number] DOB:  [birth year]  Summary: T2DM  Recommendations/Changes made from today's visit:  Diabetes: Uncontrolled (A1C 7.1%--IMPROVING); current treatment: TRESIBA 50-70 UNITS DAILY, OZEMIPIC 0.5MG SQ WEEKLY;  ENROLLED IN NOVO [place] PATIENT ASSISTANCE PROGRAM (PREVIOUSLY WITH HEALTH DEPT--WE ARE HAVING TO RE-ENROLL TODAY) USING ANYWHERE FROM 60-70 UNITS OF TRESIBA--WOULD LIKE TO OPTIMIZE GLP1 AND REDUCE INSULIN, PATIENT WORRIED ABOUT GI--WILL CONTINUE TO ADDRESS USES LIBRE CGM--NEED TO CLARIFY 14 DAY VS 2 SYSTEM (CALLED IN DIFFERENTLY) DISCUSSED INCREASING OZEMPIC TO 1MG AT F/U IF NOT CONTROLLED Denies personal and family history of Medullary thyroid cancer (MTC) Current glucose readings: fasting glucose: <150, post prandial glucose: <200 Denies hypoglycemic/hyperglycemic symptoms Discussed meal planning options and Plate method for healthy eating Avoid sugary drinks and desserts Incorporate balanced protein, non starchy veggies, 1 serving of carbohydrate with each meal Increase water intake Increase physical activity as able Current exercise: N/A Recommended POTENTIAL INCREASE IN GLP1 (OZEMPIC); DECREASE IN INSULIN Assessed patient finances. WILL RE-ENROLL PATIENT IN NOVO [place] PATIENT ASSISTANCE FOR TRESIBA AND OZEMPIC  Follow Up Plan: Telephone follow up appointment with care management team member scheduled for: 1 MONTH  Subjective: [name] is an 55 y.o. year old male who is a primary patient of Dettinger, [name], MD.  The CCM team was consulted for assistance with disease management and care coordination needs.    Engaged with patient by telephone for follow up visit in response to provider referral for pharmacy case management and/or care coordination services.   Consent to Services:  The patient was given information about Chronic Care Management services, agreed to services,  and gave verbal consent prior to initiation of services.  Please see initial visit note for detailed documentation.   Patient Care Team: Dettinger, [name], MD as PCP - General (Family Medicine) Satira Sark, MD as PCP - Cardiology (Cardiology) Lavera Guise, [facility] (Pharmacist) Irene Shipper, MD as Consulting Physician (Gastroenterology) Ralene Muskrat as Physician Assistant (Chiropractic Medicine) Celestia Khat, OD (Optometry)  Objective:  Lab Results  Component Value Date   CREATININE 1.11 07/14/2021   CREATININE 0.98 12/03/2020   CREATININE 0.88 04/23/2020    Lab Results  Component Value Date   HGBA1C 7.1 (H) 07/14/2021   Last diabetic Eye exam:  Lab Results  Component Value Date/Time   HMDIABEYEEXA No Retinopathy 08/18/2020 12:00 AM    Last diabetic Foot exam: No results found for: HMDIABFOOTEX      Component Value Date/Time   CHOL 91 (L) 07/14/2021 1042   TRIG 146 07/14/2021 1042   HDL 29 (L) 07/14/2021 1042   CHOLHDL 3.1 07/14/2021 1042   CHOLHDL 4.6 01/30/2018 1347   VLDL 54 (H) 01/30/2018 1347   LDLCALC 37 07/14/2021 1042    Hepatic Function Latest Ref Rng & Units 07/14/2021 12/03/2020 04/23/2020  Total Protein 6.0 - 8.5 g/dL 6.5 6.8 6.8  Albumin 3.8 - 4.9 g/dL 4.2 4.2 4.1  AST 0 - 40 IU/L 46(H) 20 17  ALT 0 - 44 IU/L 61(H) 37 27  Alk Phosphatase 44 - 121 IU/L 138(H) 142(H) 171(H)  Total Bilirubin 0.0 - 1.2 mg/dL 0.7 0.5 0.4    Lab Results  Component Value Date/Time   TSH 0.961 12/03/2020 11:18 AM   TSH 0.929 10/23/2019 03:59 AM   TSH 1.490 03/09/2014 11:09 AM    CBC Latest Ref Rng & Units 07/14/2021 12/03/2020 04/23/2020  WBC 3.4 - 10.8 x10E3/uL  6.8 7.0 4.8  Hemoglobin 13.0 - 17.7 g/dL 15.7 16.3 14.5  Hematocrit 37.5 - 51.0 % 46.0 48.7 42.8  Platelets 150 - 450 x10E3/uL 249 219 225    No results found for: VD25OH  Clinical ASCVD: No  The ASCVD Risk score (Arnett DK, et al., 2019) failed to calculate for the following reasons:   The  valid total cholesterol range is 130 to 320 mg/dL    Other: (CHADS2VASc if Afib, PHQ9 if depression, MMRC or CAT for COPD, ACT, DEXA)  Social History   Tobacco Use  Smoking Status Never  Smokeless Tobacco Never   BP Readings from Last 3 Encounters:  08/02/21 129/79  07/25/21 (!) 142/82  07/14/21 130/80   Pulse Readings from Last 3 Encounters:  08/02/21 72  07/25/21 69  07/14/21 74   Wt Readings from Last 3 Encounters:  08/02/21 217 lb (98.4 kg)  07/26/21 220 lb (99.8 kg)  07/25/21 220 lb 12.8 oz (100.2 kg)    Assessment: Review of patient past medical history, allergies, medications, health status, including review of consultants reports, laboratory and other test data, was performed as part of comprehensive evaluation and provision of chronic care management services.   SDOH:  (Social Determinants of Health) assessments and interventions performed:    CCM Care Plan  No Known Allergies  Medications Reviewed Today     Reviewed by Lavera Guise, [facility] (Pharmacist) on 08/30/21 at 1310  Med List Status: <None>   Medication Order Taking? Sig Documenting Provider Last Dose Status Informant  albuterol (PROVENTIL HFA;VENTOLIN HFA) 108 (90 Base) MCG/ACT inhaler [id number] No Inhale 2 puffs into the lungs every 6 (six) hours as needed for wheezing or shortness of breath. Dettinger, [name], MD Unknown Active Self  apixaban (ELIQUIS) 5 MG TABS tablet [id number] No Take 1 tablet (5 mg total) by mouth 2 (two) times daily. Dettinger, [name], MD 07/31/2021 Active   atorvastatin (LIPITOR) 20 MG tablet [id number] No Take 1 tablet (20 mg total) by mouth daily. Dettinger, [name], MD 08/01/2021 Active   cephALEXin (KEFLEX) 500 MG capsule [id number]  Take 1 capsule (500 mg total) by mouth 4 (four) times daily. Dettinger, [name], MD  Active   Continuous Blood Gluc Receiver (FREESTYLE LIBRE 2 READER) DEVI [id number] No Use as directed to test blood sugar up to 6 times daily as directed. DX: E  11.9 Dettinger, [name], MD 08/02/2021 Active   Continuous Blood Gluc Sensor (FREESTYLE LIBRE 14 DAY SENSOR) MISC [id number] No Use as directed to test blood sugar up to 6 times daily as directed. DX: E 11.9 Dettinger, [name], MD 08/02/2021 Active   DULoxetine (CYMBALTA) 60 MG capsule [id number] No Take 1 capsule (60 mg total) by mouth daily. Dettinger, [name], MD 08/01/2021 Active   fluticasone (FLONASE) 50 MCG/ACT nasal spray [id number] No Place 1 spray into both nostrils 2 (two) times daily as needed for allergies or rhinitis. Dettinger, [name], MD Taking Active Self  gabapentin (NEURONTIN) 600 MG tablet [id number]  TAKE 1 TABLET BY MOUTH THREE TIMES A DAY Dettinger, [name], MD  Active   HYDROcodone-acetaminophen [facility]) 10-325 MG tablet [id number] No Take 1 tablet by mouth 2 (two) times daily as needed. Dettinger, [name], MD Unknown Active   HYDROcodone-acetaminophen [facility]) 10-325 MG tablet [id number] No Take 1 tablet by mouth 2 (two) times daily as needed. Dettinger, [name], MD Unknown Active   HYDROcodone-acetaminophen [facility]) 10-325 MG tablet [id number] No Take 1 tablet by mouth 2 (two)  times daily as needed. Dettinger, [name], MD Unknown Active   insulin degludec (TRESIBA FLEXTOUCH) 100 UNIT/ML FlexTouch Pen [id number] No Inject 50-70 Units into the skin daily. [provider] Taking Active            Med Note Blanca Friend, Royce Macadamia   Tue Jun 28, 2021 11:55 AM) VIA NOVO NORDISK PATIENT ASSISTANCE PROGRAM  metFORMIN (GLUCOPHAGE) 1000 MG tablet [id number] No Take 1 tablet (1,000 mg total) by mouth 2 (two) times daily. Dettinger, [name], MD 08/01/2021 Active   metoprolol succinate (TOPROL-XL) 25 MG 24 hr tablet [id number] No TAKE 1 TABLET BY MOUTH EVERY DAY Satira Sark, MD 08/01/2021 Active   Multiple Vitamins-Minerals (MULTIVITAMIN WITH MINERALS) tablet [id number] No Take 1 tablet by mouth daily. [provider] 08/01/2021 Active Self  Multiple Vitamins-Minerals (THERA-M) TABS  [id number] No Take by mouth. [provider] 08/01/2021 Active   Semaglutide,0.25 or 0.5MG/DOS, (OZEMPIC, 0.25 OR 0.5 MG/DOSE,) 2 MG/1.5ML SOPN [id number] No Inject 0.5 mg into the skin once a week. Dettinger, [name], MD Past Week Active            Med Note Lavera Guise   Tue Jun 28, 2021 11:55 AM) VIA NOVO [place] PATIENT ASSISTANCE PROGRAM            Patient Active Problem List   Diagnosis Date Noted   Dyslipidemia 07/14/2021   S/P BKA (below knee amputation) unilateral, right ([place]) 02/19/2020   Osteomyelitis of ankle and foot (HCC)    Abscess of tendon sheath, right ankle and foot 12/24/2019   Bacteremia due to methicillin susceptible Staphylococcus aureus (MSSA) 12/24/2019   History of DVT (deep vein thrombosis) 10/26/2019   A-fib ([place]) 10/22/2019   Lumbar spine scoliosis 08/13/2018   S/P BKA (below knee amputation) unilateral, left ([place]) 01/12/2018   Pain management contract signed 05/04/2017   Type 2 diabetes mellitus ([place]) 07/22/2015   Neuropathy in diabetes ([place]) 03/31/2014   Essential hypertension, benign 03/03/2013   DDD (degenerative disc disease), lumbar 03/03/2013    Immunization History  Administered Date(s) Administered   Influenza, Seasonal, Injecte, Preservative Fre 10/15/2015, 08/05/2016   Influenza,inj,Quad PF,6+ Mos 10/15/2015, 08/05/2016, 08/21/2017, 08/07/2018, 07/22/2019, 09/01/2020   Moderna Sars-Covid-2 Vaccination 05/20/2020, 06/14/2020   Pneumococcal Conjugate-13 07/22/2019, 07/08/2020   Tdap 07/08/2020   Zoster Recombinat (Shingrix) 06/30/2019, 10/06/2019    Conditions to be addressed/monitored: DMII  Care Plan : PHARMD MEDICATION MANAGEMENT  Updates made by Lavera Guise, [place] since 08/30/2021 12:00 AM     Problem: DISEASE PROGRESSION PREVENTION      Long-Range Goal: T2DM   Recent Progress: Not on track  Priority: High  Note:   Current Barriers:  Unable to independently afford treatment regimen Unable to maintain control  of T2DM   Pharmacist Clinical Goal(s):  Over the next 90 days, patient will verbalize ability to afford treatment regimen maintain control of T2DM as evidenced by IMPROVED GLYCEMIC CONTROL  through collaboration with PharmD and provider.    Interventions: 1:1 collaboration with Dettinger, [name], MD regarding development and update of comprehensive plan of care as evidenced by provider attestation and co-signature Inter-disciplinary care team collaboration (see longitudinal plan of care) Comprehensive medication review performed; medication list updated in electronic medical record  Diabetes: Uncontrolled (A1C 7.1%--IMPROVING); current treatment: TRESIBA 50-70 UNITS DAILY, OZEMIPIC 0.5MG SQ WEEKLY;  ENROLLED IN NOVO [facility] PATIENT ASSISTANCE PROGRAM (PREVIOUSLY WITH HEALTH DEPT--WE ARE HAVING TO RE-ENROLL TODAY) USING ANYWHERE FROM 60-70 UNITS OF TRESIBA--WOULD LIKE TO OPTIMIZE GLP1 AND  REDUCE INSULIN, PATIENT WORRIED ABOUT GI--WILL CONTINUE TO ADDRESS USES LIBRE CGM--NEED TO CLARIFY 14 DAY VS 2 SYSTEM (CALLED IN DIFFERENTLY) DISCUSSED INCREASING OZEMPIC TO 1MG AT F/U IF NOT CONTROLLED Denies personal and family history of Medullary thyroid cancer (MTC) Current glucose readings: fasting glucose: <150, post prandial glucose: <200 Denies hypoglycemic/hyperglycemic symptoms Discussed meal planning options and Plate method for healthy eating Avoid sugary drinks and desserts Incorporate balanced protein, non starchy veggies, 1 serving of carbohydrate with each meal Increase water intake Increase physical activity as able Current exercise: N/A Recommended POTENTIAL INCREASE IN GLP1 (OZEMPIC); DECREASE IN INSULIN Assessed patient finances. WILL RE-ENROLL PATIENT IN NOVO [place] PATIENT ASSISTANCE FOR TRESIBA AND [place]   Patient Goals/Self-Care Activities Over the next 90 days, patient will:  - take medications as prescribed check glucose USING LIBRE CGM, document, and provide at  future appointments collaborate with provider on medication access solutions  Follow Up Plan: Telephone follow up appointment with care management team member scheduled for: 1 MONTH      Medication Assistance:  TRESIBA, OZEMPIC obtained through [place] medication assistance program.  Enrollment ends --HAVE TO REAPPLY NOW  Patient's preferred pharmacy is:  [id number] [place]Phone: [phone]: [phone] [facility] [place] [address]Phone: [phone]: [phone] [email or web address] [place]- [phone] LL[facility] [address] Stone City[place]Phone: [phone]: [phone] [place]Phone: [phone]: [phone]  Follow Up:  Patient agrees to Care Plan and Follow-up.  Plan: Telephone follow up appointment with care management team member scheduled for:  09/08/21   JRegina Eck PharmD, BCPS Clinical Pharmacist, [place]fontaine [place] Phone [phone]

## 2021-08-30 NOTE — Patient Instructions (Signed)
Visit Information  PATIENT GOALS:  Goals Addressed               This Visit's Progress     Patient Stated     T2DM-PHARMD GOAL (pt-stated)        Current Barriers:  Unable to independently afford treatment regimen Unable to maintain control of T2DM   Pharmacist Clinical Goal(s):  Over the next 90 days, patient will verbalize ability to afford treatment regimen maintain control of T2DM as evidenced by IMPROVED GLYCEMIC CONTROL  through collaboration with PharmD and provider.    Interventions: 1:1 collaboration with Dettinger, Elige Radon, MD regarding development and update of comprehensive plan of care as evidenced by provider attestation and co-signature Inter-disciplinary care team collaboration (see longitudinal plan of care) Comprehensive medication review performed; medication list updated in electronic medical record  Diabetes: Uncontrolled (A1C 7.1%--IMPROVING); current treatment: TRESIBA 50-70 UNITS DAILY, OZEMIPIC 0.5MG  SQ WEEKLY;  ENROLLED IN NOVO N[employer]ISK PATIENT ASSISTANCE PROGRAM (PREVIOUSLY WITH HEALTH DEPT--WE ARE HAVING TO RE-ENROLL TODAY) USING ANYWHERE FROM 60-70 UNITS OF TRESIBA--WOULD LIKE TO OPTIMIZE GLP1 AND REDUCE INSULIN, PATIENT WORRIED ABOUT GI--WILL CONTINUE TO ADDRESS USES LIBRE CGM--NEED TO CLARIFY 14 DAY VS 2 SYSTEM (CALLED IN DIFFERENTLY) DISCUSSED INCREASING OZEMPIC TO 1MG  AT F/U IF NOT CONTROLLED Denies personal and family history of Medullary thyroid cancer (MTC) Current glucose readings: fasting glucose: <150, post prandial glucose: <200 Denies hypoglycemic/hyperglycemic symptoms Discussed meal planning options and Plate method for healthy eating Avoid sugary drinks and desserts Incorporate balanced protein, non starchy veggies, 1 serving of carbohydrate with each meal Increase water intake Increase physical activity as able Current exercise: N/A Recommended POTENTIAL INCREASE IN GLP1 (OZEMPIC); DECREASE IN INSULIN Assessed patient finances.  WILL RE-ENROLL PATIENT IN NOVO [facility] PATIENT ASSISTANCE FOR TRESIBA AND OZEMPIC   Patient Goals/Self-Care Activities Over the next 90 days, patient will:  - take medications as prescribed check glucose USING LIBRE CGM, document, and provide at future appointments collaborate with provider on medication access solutions  Follow Up Plan: Telephone follow up appointment with care management team member scheduled for: 1 MONTH         The patient verbalized understanding of instructions, educational materials, and care plan provided today and declined offer to receive copy of patient instructions, educational materials, and care plan.   Telephone follow up appointment with care management team member scheduled for: 09/08/21  Signature 13/3/22, PharmD, BCPS Clinical Pharmacist, [facility]  II Phone [phone]

## 2021-09-02 ENCOUNTER — Telehealth (INDEPENDENT_AMBULATORY_CARE_PROVIDER_SITE_OTHER): Payer: Medicare Other | Admitting: Family Medicine

## 2021-09-02 ENCOUNTER — Encounter: Payer: Self-pay | Admitting: Family Medicine

## 2021-09-02 DIAGNOSIS — J069 Acute upper respiratory infection, unspecified: Secondary | ICD-10-CM

## 2021-09-02 MED ORDER — AMOXICILLIN 875 MG PO TABS: 875.0000 mg | ORAL_TABLET | Freq: Two times a day (BID) | ORAL | 0 refills | Status: AC

## 2021-09-02 NOTE — Progress Notes (Signed)
   Virtual Visit via video Note   Due to COVID-19 pandemic this visit was conducted virtually. This visit type was conducted due to national recommendations for restrictions regarding the COVID-19 Pandemic (e.g. social distancing, sheltering in place) in an effort to limit this patient's exposure and mitigate transmission in our community. All issues noted in this document were discussed and addressed.  A physical exam was not performed with this format.  I connected with  [name]  on 09/02/21 at 1129 by video and verified that I am speaking with the correct person using two identifiers. [name] is currently located at home and no one is currently with him during the visit. The provider, Gabriel Earing, FNP is located in their office at time of visit.  I discussed the limitations, risks, security and privacy concerns of performing an evaluation and management service by video  and the availability of in person appointments. I also discussed with the patient that there may be a patient responsible charge related to this service. The patient expressed understanding and agreed to proceed.  CC: cough  History and Present Illness:  [name] reports a cough for 1 week with clear and yellow sputum. He also reports congestion, HA, chills, and body aches. He denies shortness of breath, nausea, vomiting, or diarrhea. He has been taking honey, claritin, and robitussin for his symptoms. He has Covid tests at home but has not taken one, he has been quarantining instead.    ROS As per HPI.     Observations/Objective: Alert and oriented x 3. Able to speak in full sentences without difficulty. Respirations appear unlabored. No obvious distress.    Assessment and Plan: [name] was seen today for cough.  Diagnoses and all orders for this visit:  Upper respiratory tract infection, unspecified type Symptoms x 7 days. He has been Nature conservation officer. Declined Covid test. Amoxicillin as below. Discussed  symptomatic care and return precautions.  -     amoxicillin (AMOXIL) 875 MG tablet; Take 1 tablet (875 mg total) by mouth 2 (two) times daily for 7 days.   Follow Up Instructions: As needed.     I discussed the assessment and treatment plan with the patient. The patient was provided an opportunity to ask questions and all were answered. The patient agreed with the plan and demonstrated an understanding of the instructions.   The patient was advised to call back or seek an in-person evaluation if the symptoms worsen or if the condition fails to improve as anticipated.  The above assessment and management plan was discussed with the patient. The patient verbalized understanding of and has agreed to the management plan. Patient is aware to call the clinic if symptoms persist or worsen. Patient is aware when to return to the clinic for a follow-up visit. Patient educated on when it is appropriate to go to the emergency department.   Time call ended: 1135  I provided 6 minutes of face-to-face time during this encounter.    Gabriel Earing, FNP

## 2021-09-05 DIAGNOSIS — E1165 Type 2 diabetes mellitus with hyperglycemia: Secondary | ICD-10-CM | POA: Diagnosis not present

## 2021-09-05 DIAGNOSIS — E1122 Type 2 diabetes mellitus with diabetic chronic kidney disease: Secondary | ICD-10-CM | POA: Diagnosis not present

## 2021-09-05 DIAGNOSIS — Z794 Long term (current) use of insulin: Secondary | ICD-10-CM | POA: Diagnosis not present

## 2021-09-08 ENCOUNTER — Telehealth: Payer: Self-pay | Admitting: Family Medicine

## 2021-09-08 ENCOUNTER — Ambulatory Visit (INDEPENDENT_AMBULATORY_CARE_PROVIDER_SITE_OTHER): Payer: Medicare Other | Admitting: Pharmacist

## 2021-09-08 DIAGNOSIS — E785 Hyperlipidemia, unspecified: Secondary | ICD-10-CM

## 2021-09-08 DIAGNOSIS — Z794 Long term (current) use of insulin: Secondary | ICD-10-CM

## 2021-09-08 DIAGNOSIS — I10 Essential (primary) hypertension: Secondary | ICD-10-CM

## 2021-09-08 MED ORDER — APIXABAN 5 MG PO TABS: 5.0000 mg | ORAL_TABLET | Freq: Two times a day (BID) | ORAL | 11 refills | Status: DC

## 2021-09-08 NOTE — Progress Notes (Signed)
Chronic Care Management Pharmacy Note  09/08/2021 Name:  [name] MRN:  [id number] DOB:  1966/03/18  Summary: T2DM, HTN  Recommendations/Changes made from today's visit:   Subjective: [name] is an 55 y.o. year old male who is a primary patient of Dettinger, [name], MD.  The CCM team was consulted for assistance with disease management and care coordination needs.    Engaged with patient by telephone for follow up visit in response to provider referral for pharmacy case management and/or care coordination services.   Consent to Services:  The patient was given information about Chronic Care Management services, agreed to services, and gave verbal consent prior to initiation of services.  Please see initial visit note for detailed documentation.   Patient Care Team: Dettinger, [name], MD as PCP - General (Family Medicine) Satira Sark, MD as PCP - Cardiology (Cardiology) Lavera Guise, [facility] (Pharmacist) Irene Shipper, MD as Consulting Physician (Gastroenterology) Ralene Muskrat as Physician Assistant (Chiropractic Medicine) Celestia Khat, OD (Optometry)  Objective:  Lab Results  Component Value Date   CREATININE 1.11 07/14/2021   CREATININE 0.98 12/03/2020   CREATININE 0.88 04/23/2020    Lab Results  Component Value Date   HGBA1C 7.1 (H) 07/14/2021   Last diabetic Eye exam:  Lab Results  Component Value Date/Time   HMDIABEYEEXA No Retinopathy 08/18/2020 12:00 AM    Last diabetic Foot exam: No results found for: HMDIABFOOTEX      Component Value Date/Time   CHOL 91 (L) 07/14/2021 1042   TRIG 146 07/14/2021 1042   HDL 29 (L) 07/14/2021 1042   CHOLHDL 3.1 07/14/2021 1042   CHOLHDL 4.6 01/30/2018 1347   VLDL 54 (H) 01/30/2018 1347   LDLCALC 37 07/14/2021 1042    Hepatic Function Latest Ref Rng & Units 07/14/2021 12/03/2020 04/23/2020  Total Protein 6.0 - 8.5 g/dL 6.5 6.8 6.8  Albumin 3.8 - 4.9 g/dL 4.2 4.2 4.1  AST 0 - 40 IU/L 46(H)  20 17  ALT 0 - 44 IU/L 61(H) 37 27  Alk Phosphatase 44 - 121 IU/L 138(H) 142(H) 171(H)  Total Bilirubin 0.0 - 1.2 mg/dL 0.7 0.5 0.4    Lab Results  Component Value Date/Time   TSH 0.961 12/03/2020 11:18 AM   TSH 0.929 10/23/2019 03:59 AM   TSH 1.490 03/09/2014 11:09 AM    CBC Latest Ref Rng & Units 07/14/2021 12/03/2020 04/23/2020  WBC 3.4 - 10.8 x10E3/uL 6.8 7.0 4.8  Hemoglobin 13.0 - 17.7 g/dL 15.7 16.3 14.5  Hematocrit 37.5 - 51.0 % 46.0 48.7 42.8  Platelets 150 - 450 x10E3/uL 249 219 225    No results found for: VD25OH  Clinical ASCVD: No  The ASCVD Risk score (Arnett DK, et al., 2019) failed to calculate for the following reasons:   The valid total cholesterol range is 130 to 320 mg/dL    Other: (CHADS2VASc if Afib, PHQ9 if depression, MMRC or CAT for COPD, ACT, DEXA)  Social History   Tobacco Use  Smoking Status Never  Smokeless Tobacco Never   BP Readings from Last 3 Encounters:  08/02/21 129/79  07/25/21 (!) 142/82  07/14/21 130/80   Pulse Readings from Last 3 Encounters:  08/02/21 72  07/25/21 69  07/14/21 74   Wt Readings from Last 3 Encounters:  08/02/21 217 lb (98.4 kg)  07/26/21 220 lb (99.8 kg)  07/25/21 220 lb 12.8 oz (100.2 kg)    Assessment: Review of patient past medical history, allergies,  medications, health status, including review of consultants reports, laboratory and other test data, was performed as part of comprehensive evaluation and provision of chronic care management services.   SDOH:  (Social Determinants of Health) assessments and interventions performed:    CCM Care Plan  No Known Allergies  Medications Reviewed Today     Reviewed by Lavera Guise, [facility] (Pharmacist) on 09/22/21 at 2135  Med List Status: <None>   Medication Order Taking? Sig Documenting Provider Last Dose Status Informant  albuterol (PROVENTIL HFA;VENTOLIN HFA) 108 (90 Base) MCG/ACT inhaler [id number] No Inhale 2 puffs into the lungs every 6 (six) hours as  needed for wheezing or shortness of breath. Dettinger, [name], MD Unknown Active Self  apixaban (ELIQUIS) 5 MG TABS tablet [id number]  Take 1 tablet (5 mg total) by mouth 2 (two) times daily. Satira Sark, MD  Active   atorvastatin (LIPITOR) 20 MG tablet [id number] No Take 1 tablet (20 mg total) by mouth daily. Dettinger, [name], MD 08/01/2021 Active   Continuous Blood Gluc Receiver (FREESTYLE LIBRE 2 READER) DEVI [id number] No Use as directed to test blood sugar up to 6 times daily as directed. DX: E 11.9 Dettinger, [name], MD 08/02/2021 Active   Continuous Blood Gluc Sensor (FREESTYLE LIBRE 14 DAY SENSOR) MISC [id number] No Use as directed to test blood sugar up to 6 times daily as directed. DX: E 11.9 Dettinger, [name], MD 08/02/2021 Active   DULoxetine (CYMBALTA) 60 MG capsule [id number] No Take 1 capsule (60 mg total) by mouth daily. Dettinger, [name], MD 08/01/2021 Active   fluticasone (FLONASE) 50 MCG/ACT nasal spray [id number] No Place 1 spray into both nostrils 2 (two) times daily as needed for allergies or rhinitis. Dettinger, [name], MD Taking Active Self  gabapentin (NEURONTIN) 600 MG tablet [id number]  TAKE 1 TABLET BY MOUTH THREE TIMES A DAY Dettinger, [name], MD  Active   HYDROcodone-acetaminophen [facility]) 10-325 MG tablet [id number] No Take 1 tablet by mouth 2 (two) times daily as needed. Dettinger, [name], MD Unknown Active   HYDROcodone-acetaminophen [facility]) 10-325 MG tablet [id number] No Take 1 tablet by mouth 2 (two) times daily as needed. Dettinger, [name], MD Unknown Active   HYDROcodone-acetaminophen [facility]) 10-325 MG tablet [id number] No Take 1 tablet by mouth 2 (two) times daily as needed. Dettinger, [name], MD Unknown Active   insulin degludec (TRESIBA FLEXTOUCH) 100 UNIT/ML FlexTouch Pen [id number] No Inject 50-70 Units into the skin daily. [provider] Taking Active            Med Note Blanca Friend, Royce Macadamia   Tue Jun 28, 2021 11:55 AM) VIA [facility] [place]  PATIENT ASSISTANCE PROGRAM  metFORMIN (GLUCOPHAGE) 1000 MG tablet [id number] No Take 1 tablet (1,000 mg total) by mouth 2 (two) times daily. Dettinger, [name], MD 08/01/2021 Active   metoprolol succinate (TOPROL-XL) 25 MG 24 hr tablet [id number] No TAKE 1 TABLET BY MOUTH EVERY DAY Satira Sark, MD 08/01/2021 Active   Multiple Vitamins-Minerals (MULTIVITAMIN WITH MINERALS) tablet [id number] No Take 1 tablet by mouth daily. [provider] 08/01/2021 Active Self  Multiple Vitamins-Minerals (THERA-M) TABS [id number] No Take by mouth. [provider] 08/01/2021 Active   Semaglutide,0.25 or 0.5MG/DOS, (OZEMPIC, 0.25 OR 0.5 MG/DOSE,) 2 MG/1.5ML SOPN [id number] No Inject 0.5 mg into the skin once a week. Dettinger, [name], MD Past Week Active            Med Note Blanca Friend, Royce Macadamia   Tue Jun 28, 2021 11:55 AM) VIA NOVO [place] PATIENT ASSISTANCE PROGRAM            Patient Active Problem List   Diagnosis Date Noted   Dyslipidemia 07/14/2021   S/P BKA (below knee amputation) unilateral, right ([place]) 02/19/2020   Osteomyelitis of ankle and foot (HCC)    Abscess of tendon sheath, right ankle and foot 12/24/2019   Bacteremia due to methicillin susceptible Staphylococcus aureus (MSSA) 12/24/2019   History of DVT (deep vein thrombosis) 10/26/2019   A-fib ([place]) 10/22/2019   Lumbar spine scoliosis 08/13/2018   S/P BKA (below knee amputation) unilateral, left ([place]) 01/12/2018   Pain management contract signed 05/04/2017   Type 2 diabetes mellitus ([place]) 07/22/2015   Neuropathy in diabetes ([place]) 03/31/2014   Essential hypertension, benign 03/03/2013   DDD (degenerative disc disease), lumbar 03/03/2013    Immunization History  Administered Date(s) Administered   Influenza, Seasonal, Injecte, Preservative Fre 10/15/2015, 08/05/2016   Influenza,inj,Quad PF,6+ Mos 10/15/2015, 08/05/2016, 08/21/2017, 08/07/2018, 07/22/2019, 09/01/2020   Moderna Sars-Covid-2 Vaccination 05/20/2020,  06/14/2020   Pneumococcal Conjugate-13 07/22/2019, 07/08/2020   Tdap 07/08/2020   Zoster Recombinat (Shingrix) 06/30/2019, 10/06/2019    Conditions to be addressed/monitored: HTN and DMII  Care Plan : PHARMD MEDICATION MANAGEMENT  Updates made by Lavera Guise, [place] since 09/22/2021 12:00 AM     Problem: DISEASE PROGRESSION PREVENTION      Long-Range Goal: T2DM, HTN   Recent Progress: Not on track  Priority: High  Note:   Current Barriers:  Unable to independently afford treatment regimen Unable to maintain control of T2DM   Pharmacist Clinical Goal(s):  Over the next 90 days, patient will verbalize ability to afford treatment regimen maintain control of T2DM as evidenced by IMPROVED GLYCEMIC CONTROL  through collaboration with PharmD and provider.   Interventions: 1:1 collaboration with Dettinger, [name], MD regarding development and update of comprehensive plan of care as evidenced by provider attestation and co-signature Inter-disciplinary care team collaboration (see longitudinal plan of care) Comprehensive medication review performed; medication list updated in electronic medical record  Diabetes: Uncontrolled (A1C 7.1%--IMPROVING); current treatment: TRESIBA 50-70 UNITS DAILY, OZEMIPIC 0.5MG SQ WEEKLY;  ENROLLED IN NOVO [place] PATIENT ASSISTANCE PROGRAM (PREVIOUSLY WITH HEALTH DEPT--WE ARE HAVING TO RE-ENROLL for 2023) USING ANYWHERE FROM 60-70 UNITS OF TRESIBA DAILY--WOULD LIKE TO OPTIMIZE GLP1 AND REDUCE INSULIN, PATIENT WORRIED ABOUT GI--WILL CONTINUE TO ADDRESS-->PUSH OZEMPIC TO 1MG WEEKLY DISCUSSED INCREASING OZEMPIC TO 1MG AT F/U IF NOT CONTROLLED Denies personal and family history of Medullary thyroid cancer (MTC) USES LIBRE CGM--NEED TO CLARIFY 14 DAY VS 2 SYSTEM (CALLED IN DIFFERENTLY)--CALLED IN LIBRE 2 cgm SYSTEM TO [facility] (ESCRIBED) Current glucose readings: fasting glucose: <150, post prandial glucose: <200 Denies hypoglycemic/hyperglycemic  symptoms Discussed meal planning options and Plate method for healthy eating Avoid sugary drinks and desserts Incorporate balanced protein, non starchy veggies, 1 serving of carbohydrate with each meal Increase water intake Increase physical activity as able Current exercise: N/A Recommended POTENTIAL INCREASE IN GLP1 (OZEMPIC); DECREASE IN INSULIN Assessed patient finances. WILL RE-ENROLL PATIENT IN NOVO [place] PATIENT ASSISTANCE FOR TRESIBA AND OZEMPIC -DISCUSSED BLOOD PRESSURE-- Would benefit from ACEi/ARB, will address with PCP at patient's next visit.  ON METOPROLOL FOR AFIB PER CARDS He was previously on lisinopril per chart notes: "BP was soft last month, therefore his PCP discontinued Lisinopril.  He was discharged on Cardizem CD but refills were not provided, therefore he discontinued this several weeks ago.  BP is at 108/69 during today's visit.  Patient  Goals/Self-Care Activities Over the next 90 days, patient will:  - take medications as prescribed check glucose USING LIBRE CGM, document, and provide at future appointments collaborate with provider on medication access solutions  Follow Up Plan: Telephone follow up appointment with care management team member scheduled for: 1 MONTH      Medication Assistance: Application for OZEMPIC/TRESIBA--[facility] [place]  medication assistance program. in process.  Anticipated assistance start date TBD.  See plan of care for additional detail.  Patient's preferred pharmacy is:  [id number] [place] [address]Phone: [phone]: [phone] [facility] [address]Phone: [phone]: [phone] [facility] - [place]- [phone][facility] Rehabilitation[facility] [address]Jam[place]Phone: [phone]: [phone] [place]Phone: [phone]: [phone]  Follow Up:  Patient agrees to Care Plan and Follow-up.  Plan: Telephone follow up appointment with care management team member scheduled for:  1 MONTH   JRegina Eck PharmD, BCPS Clinical Pharmacist, [place] Phone [phone]

## 2021-09-09 ENCOUNTER — Telehealth: Payer: Self-pay | Admitting: Cardiology

## 2021-09-09 NOTE — Telephone Encounter (Signed)
Unfortunately the only thing that is less expensive and his Coumadin and he would have to come in regularly for Coumadin/INR checks, if he does want to make that transition then please have him schedule an appointment so we can talk about that transition but that is the only thing that is generic

## 2021-09-09 NOTE — Telephone Encounter (Signed)
Lmtcb.

## 2021-09-09 NOTE — Telephone Encounter (Signed)
Pt c/o medication issue:  1. Name of Medication:  apixaban (ELIQUIS) 5 MG TABS tablet  2. How are you currently taking this medication (dosage and times per day)?   3. Are you having a reaction (difficulty breathing--STAT)?   4. What is your medication issue?   Patient states this medication will cost him $47/30 day supply and he is no longer able to afford it. He would like to know if we offer assistance or if he can be put on an alternative. Please advise.

## 2021-09-09 NOTE — Telephone Encounter (Signed)
Patient aware and verbalized understanding. He will call his cardiologist and ask their opinions first.

## 2021-09-12 NOTE — Telephone Encounter (Signed)
Please see if patient qualifies for patient assistance.  If not, Will need Dr McDowell's approval to change from Eliquis to Warfarin. Thanks

## 2021-09-13 MED ORDER — APIXABAN 5 MG PO TABS: 5.0000 mg | ORAL_TABLET | Freq: Two times a day (BID) | ORAL | 0 refills | Status: DC

## 2021-09-13 NOTE — Telephone Encounter (Signed)
Advised that eliquis samples are available for pick up along with BMS-PAF that she is to fill out all patient sections and bring back to office in January 2023 with proof of income and prescription out of pocket expense for 2023. Verbalized understanding.

## 2021-09-13 NOTE — Telephone Encounter (Signed)
Samples provided 

## 2021-09-13 NOTE — Telephone Encounter (Signed)
Patient wants to know if there is a patient assistance program for Eliquis, he can't afford it anymore.  Or if there is something else he can take.  He also wants to know which office the samples are at.

## 2021-09-21 NOTE — Patient Instructions (Addendum)
Visit Information  Diabetes: Uncontrolled (A1C 7.1%--IMPROVING); current treatment: TRESIBA 50-70 UNITS DAILY, OZEMIPIC 0.5MG  SQ WEEKLY;  ENROLLED IN [facility] [place] PATIENT ASSISTANCE PROGRAM (PREVIOUSLY WITH HEALTH DEPT--WE ARE HAVING TO RE-ENROLL for 2023) USING ANYWHERE FROM 60-70 UNITS OF TRESIBA DAILY--WOULD LIKE TO OPTIMIZE GLP1 AND REDUCE INSULIN, PATIENT WORRIED ABOUT GI--WILL CONTINUE TO ADDRESS-->PUSH OZEMPIC TO 1MG  WEEKLY DISCUSSED INCREASING OZEMPIC TO 1MG  AT F/U IF NOT CONTROLLED Denies personal and family history of Medullary thyroid cancer (MTC) USES LIBRE CGM--NEED TO CLARIFY 14 DAY VS 2 SYSTEM (CALLED IN DIFFERENTLY)--CALLED IN LIBRE 2 cgm SYSTEM TO [facility] (ESCRIBED) Current glucose readings: fasting glucose: <150, post prandial glucose: <200 Denies hypoglycemic/hyperglycemic symptoms Discussed meal planning options and Plate method for healthy eating Avoid sugary drinks and desserts Incorporate balanced protein, non starchy veggies, 1 serving of carbohydrate with each meal Increase water intake Increase physical activity as able Current exercise: N/A Recommended POTENTIAL INCREASE IN GLP1 (OZEMPIC); DECREASE IN INSULIN Assessed patient finances. WILL RE-ENROLL PATIENT IN [facility] [place] PATIENT ASSISTANCE FOR TRESIBA AND OZEMPIC -DISCUSSED BLOOD PRESSURE-- Would benefit from ACEi/ARB, will address with PCP at patient's next visit.  ON METOPROLOL FOR AFIB PER CARDS He was previously on lisinopril per chart notes: "BP was soft last month, therefore his PCP discontinued Lisinopril.  He was discharged on Cardizem CD but refills were not provided, therefore he discontinued this several weeks ago.  BP is at 108/69 during today's visit.  Patient Goals/Self-Care Activities Over the next 90 days, patient will:  - take medications as prescribed check glucose USING LIBRE CGM, document, and provide at future appointments collaborate with provider on medication access  solutions  Follow Up Plan: Telephone follow up appointment with care management team member scheduled for: 1 MONTH  The patient verbalized understanding of instructions, educational materials, and care plan provided today and declined offer to receive copy of patient instructions, educational materials, and care plan.   Telephone follow up appointment with care management team member scheduled for: 1 MONTH  , PharmD, BCPS Clinical Pharmacist, [facility]  II Phone [phone]

## 2021-09-22 MED ORDER — FREESTYLE LIBRE 2 SENSOR MISC: 11 refills | Status: DC

## 2021-09-22 MED ORDER — FREESTYLE LIBRE 2 READER DEVI: 0 refills | Status: DC

## 2021-10-03 NOTE — Progress Notes (Signed)
Received notification from [employer] [facility] regarding 2023 RE-ENROLLMENT approval for TRESIBA & OZEMPIC. Patient assistance approved from 11/06/21 to 11/05/22.  NEXT SHIPMENTS WILL GO OUT IN JAN 2023  Phone: [phone]

## 2021-10-04 ENCOUNTER — Telehealth: Payer: Self-pay | Admitting: Family Medicine

## 2021-10-04 NOTE — Telephone Encounter (Signed)
Pt is running out of Ozempic and wants to know if he could get more samples. Please call back.

## 2021-10-04 NOTE — Telephone Encounter (Signed)
Patient aware.

## 2021-10-04 NOTE — Telephone Encounter (Signed)
Please pull #1 ozempic sample for patient  His patient assistance shipment will not go out until jan 2023  Received notification from [employer] regarding 2023 RE-ENROLLMENT approval for TRESIBA & OZEMPIC. Patient assistance approved from 11/06/21 to 11/05/22.   NEXT SHIPMENTS WILL GO OUT IN JAN 2023   Phone: [phone]

## 2021-10-05 DIAGNOSIS — Z794 Long term (current) use of insulin: Secondary | ICD-10-CM

## 2021-10-05 DIAGNOSIS — E1122 Type 2 diabetes mellitus with diabetic chronic kidney disease: Secondary | ICD-10-CM

## 2021-10-05 DIAGNOSIS — I10 Essential (primary) hypertension: Secondary | ICD-10-CM

## 2021-10-06 ENCOUNTER — Telehealth: Payer: Self-pay | Admitting: Family Medicine

## 2021-10-06 NOTE — Telephone Encounter (Signed)
Patient aware to stay on freestyle

## 2021-10-07 DIAGNOSIS — Z794 Long term (current) use of insulin: Secondary | ICD-10-CM | POA: Diagnosis not present

## 2021-10-07 DIAGNOSIS — E1165 Type 2 diabetes mellitus with hyperglycemia: Secondary | ICD-10-CM | POA: Diagnosis not present

## 2021-10-10 ENCOUNTER — Telehealth: Payer: Self-pay | Admitting: Family Medicine

## 2021-10-10 NOTE — Telephone Encounter (Signed)
Pt has an appt at 3:55 12/7

## 2021-10-12 ENCOUNTER — Encounter: Payer: Self-pay | Admitting: Family Medicine

## 2021-10-12 ENCOUNTER — Ambulatory Visit (INDEPENDENT_AMBULATORY_CARE_PROVIDER_SITE_OTHER): Payer: Medicare Other | Admitting: Family Medicine

## 2021-10-12 VITALS — BP 142/80 | HR 79 | Ht 74.0 in | Wt 215.0 lb

## 2021-10-12 DIAGNOSIS — E1122 Type 2 diabetes mellitus with diabetic chronic kidney disease: Secondary | ICD-10-CM | POA: Diagnosis not present

## 2021-10-12 DIAGNOSIS — Z23 Encounter for immunization: Secondary | ICD-10-CM

## 2021-10-12 DIAGNOSIS — R7989 Other specified abnormal findings of blood chemistry: Secondary | ICD-10-CM

## 2021-10-12 DIAGNOSIS — Z0289 Encounter for other administrative examinations: Secondary | ICD-10-CM

## 2021-10-12 DIAGNOSIS — G8929 Other chronic pain: Secondary | ICD-10-CM | POA: Diagnosis not present

## 2021-10-12 DIAGNOSIS — Z89512 Acquired absence of left leg below knee: Secondary | ICD-10-CM

## 2021-10-12 DIAGNOSIS — I10 Essential (primary) hypertension: Secondary | ICD-10-CM

## 2021-10-12 DIAGNOSIS — M79673 Pain in unspecified foot: Secondary | ICD-10-CM | POA: Diagnosis not present

## 2021-10-12 DIAGNOSIS — M5136 Other intervertebral disc degeneration, lumbar region: Secondary | ICD-10-CM | POA: Diagnosis not present

## 2021-10-12 DIAGNOSIS — Z794 Long term (current) use of insulin: Secondary | ICD-10-CM

## 2021-10-12 DIAGNOSIS — E1142 Type 2 diabetes mellitus with diabetic polyneuropathy: Secondary | ICD-10-CM | POA: Diagnosis not present

## 2021-10-12 LAB — BAYER DCA HB A1C WAIVED: HB A1C (BAYER DCA - WAIVED): 7.7 % — ABNORMAL HIGH (ref 4.8–5.6)

## 2021-10-12 MED ORDER — HYDROCODONE-ACETAMINOPHEN 10-325 MG PO TABS: 1.0000 | ORAL_TABLET | Freq: Two times a day (BID) | ORAL | 0 refills | Status: DC | PRN

## 2021-10-12 NOTE — Progress Notes (Signed)
BP (!) 142/80   Pulse 79   Ht 6' 2" (1.88 m)   Wt 215 lb (97.5 kg)   SpO2 96%   BMI 27.60 kg/m    Subjective:   Patient ID: [name], male    DOB: 04/22/1966, 55 y.o.   MRN: [id number]  HPI: [name] is a 55 y.o. male presenting on 10/12/2021 for Medical Management of Chronic Issues   HPI Type 2 diabetes mellitus Patient comes in today for recheck of his diabetes. Patient has been currently taking metformin and Ozempic and [place]. Patient is not currently on an ACE inhibitor/ARB. Patient has seen an ophthalmologist this year. Patient denies any issues with their feet. The symptom started onset as an adult htn and neuropathy and hyperlipidemia ARE RELATED TO DM   Pain assessment: Cause of pain-degenerative disc disease and scoliosis lumbar Pain location-back and legs Pain on scale of 1-10- 5 Frequency-Daily What increases pain-being on feet for prolonged periods What makes pain Better-pain medication and rest Effects on ADL -limits slightly but gets around pretty good Any change in general medical condition-none  Current opioids rx- norco 10-325 bid prn # meds rx-60 Effectiveness of current meds-works well Adverse reactions from pain meds-none Morphine equivalent-20  Pill count performed-No Last drug screen -03/09/2021 ( high risk q23m moderate risk q619mlow risk yearly ) Urine drug screen today- No Was the [place]- yes  If yes were their any concerning findings? - none   No flowsheet data found.   Pain contract signed on: 03/09/21  Hypertension Patient is currently on metoprolol, and their blood pressure today is 142/80. Patient denies any lightheadedness or dizziness. Patient denies headaches, blurred vision, chest pains, shortness of breath, or weakness. Denies any side effects from medication and is content with current medication.   Hyperlipidemia Patient is coming in for recheck of his hyperlipidemia. The patient is currently taking Lipitor.  They deny any issues with myalgias or history of liver damage from it. They deny any focal numbness or weakness or chest pain.   Relevant past medical, surgical, family and social history reviewed and updated as indicated. Interim medical history since our last visit reviewed. Allergies and medications reviewed and updated.  Review of Systems  Constitutional:  Negative for chills and fever.  Eyes:  Negative for visual disturbance.  Respiratory:  Negative for shortness of breath and wheezing.   Cardiovascular:  Negative for chest pain and leg swelling.  Musculoskeletal:  Negative for back pain and gait problem.  Skin:  Negative for rash.  Neurological:  Negative for dizziness, weakness and light-headedness.  All other systems reviewed and are negative.  Per HPI unless specifically indicated above   Allergies as of 10/12/2021   No Known Allergies      Medication List        Accurate as of October 12, 2021  4:42 PM. If you have any questions, ask your nurse or doctor.          albuterol 108 (90 Base) MCG/ACT inhaler Commonly known as: VENTOLIN HFA Inhale 2 puffs into the lungs every 6 (six) hours as needed for wheezing or shortness of breath.   apixaban 5 MG Tabs tablet Commonly known as: ELIQUIS Take 1 tablet (5 mg total) by mouth 2 (two) times daily.   atorvastatin 20 MG tablet Commonly known as: LIPITOR Take 1 tablet (20 mg total) by mouth daily.   DULoxetine 60 MG capsule Commonly known as: Cymbalta Take 1 capsule (60 mg  total) by mouth daily.   fluticasone 50 MCG/ACT nasal spray Commonly known as: FLONASE Place 1 spray into both nostrils 2 (two) times daily as needed for allergies or rhinitis.   FreeStyle Libre 2 Reader Devi Use as directed to test blood sugar up to 6 times daily as directed. DX: E 11.9   FreeStyle Libre 2 Sensor Misc Use as directed to test blood sugar up to 6 times daily as directed. DX: E 11.9   gabapentin 600 MG tablet Commonly known  as: NEURONTIN TAKE 1 TABLET BY MOUTH THREE TIMES A DAY   HYDROcodone-acetaminophen 10-325 MG tablet Commonly known as: NORCO Take 1 tablet by mouth 2 (two) times daily as needed. What changed: Another medication with the same name was changed. Make sure you understand how and when to take each. Changed by: Shirah Roseman A Braelynne Garinger, MD   HYDROcodone-acetaminophen 10-325 MG tablet Commonly known as: NORCO Take 1 tablet by mouth 2 (two) times daily as needed. Start taking on: November 11, 2021 What changed: These instructions start on November 11, 2021. If you are unsure what to do until then, ask your doctor or other care provider. Changed by: Sabrena Gavitt A Bernadette Armijo, MD   HYDROcodone-acetaminophen 10-325 MG tablet Commonly known as: NORCO Take 1 tablet by mouth 2 (two) times daily as needed. Start taking on: December 12, 2021 What changed: These instructions start on December 12, 2021. If you are unsure what to do until then, ask your doctor or other care provider. Changed by: Cathryne Mancebo A Alazar Cherian, MD   metFORMIN 1000 MG tablet Commonly known as: GLUCOPHAGE Take 1 tablet (1,000 mg total) by mouth 2 (two) times daily.   metoprolol succinate 25 MG 24 hr tablet Commonly known as: TOPROL-XL TAKE 1 TABLET BY MOUTH EVERY DAY   multivitamin with minerals tablet Take 1 tablet by mouth daily.   Ozempic (0.25 or 0.5 MG/DOSE) 2 MG/1.5ML Sopn Generic drug: Semaglutide(0.25 or 0.5MG/DOS) Inject 0.5 mg into the skin once a week.   Tresiba FlexTouch 100 UNIT/ML FlexTouch Pen Generic drug: insulin degludec Inject 50-70 Units into the skin daily.         Objective:   BP (!) 142/80   Pulse 79   Ht 6' 2" (1.88 m)   Wt 215 lb (97.5 kg)   SpO2 96%   BMI 27.60 kg/m   Wt Readings from Last 3 Encounters:  10/12/21 215 lb (97.5 kg)  08/02/21 217 lb (98.4 kg)  07/26/21 220 lb (99.8 kg)    Physical Exam Vitals and nursing note reviewed.  Constitutional:      General: He is not in acute distress.     Appearance: He is well-developed. He is not diaphoretic.  Eyes:     General: No scleral icterus.    Conjunctiva/sclera: Conjunctivae normal.  Neck:     Thyroid: No thyromegaly.  Cardiovascular:     Rate and Rhythm: Normal rate and regular rhythm.     Heart sounds: Normal heart sounds. No murmur heard. Pulmonary:     Effort: Pulmonary effort is normal. No respiratory distress.     Breath sounds: Normal breath sounds. No wheezing.  Musculoskeletal:        General: No swelling. Normal range of motion.     Cervical back: Neck supple.  Lymphadenopathy:     Cervical: No cervical adenopathy.  Skin:    General: Skin is warm and dry.     Findings: No rash.  Neurological:     Mental Status: He is alert   and oriented to person, place, and time.     Coordination: Coordination normal.  Psychiatric:        Behavior: Behavior normal.      Assessment & Plan:   Problem List Items Addressed This Visit       Cardiovascular and Mediastinum   Essential hypertension, benign     Endocrine   Neuropathy in diabetes (HCC)   Type 2 diabetes mellitus (HCC) - Primary   Relevant Orders   Bayer DCA Hb A1c Waived (Completed)     Musculoskeletal and Integument   DDD (degenerative disc disease), lumbar   Relevant Medications   HYDROcodone-acetaminophen (NORCO) 10-325 MG tablet   HYDROcodone-acetaminophen (NORCO) 10-325 MG tablet (Start on 12/12/2021)   HYDROcodone-acetaminophen (NORCO) 10-325 MG tablet (Start on 11/11/2021)     Other   Pain management contract signed   Relevant Medications   HYDROcodone-acetaminophen (NORCO) 10-325 MG tablet   HYDROcodone-acetaminophen (NORCO) 10-325 MG tablet (Start on 12/12/2021)   HYDROcodone-acetaminophen (NORCO) 10-325 MG tablet (Start on 11/11/2021)   S/P BKA (below knee amputation) unilateral, left (HCC)   Relevant Medications   HYDROcodone-acetaminophen (NORCO) 10-325 MG tablet   HYDROcodone-acetaminophen (NORCO) 10-325 MG tablet (Start on 12/12/2021)    HYDROcodone-acetaminophen (NORCO) 10-325 MG tablet (Start on 11/11/2021)   Other Visit Diagnoses     Chronic foot pain, unspecified laterality       Relevant Medications   HYDROcodone-acetaminophen (NORCO) 10-325 MG tablet   HYDROcodone-acetaminophen (NORCO) 10-325 MG tablet (Start on 12/12/2021)   HYDROcodone-acetaminophen (NORCO) 10-325 MG tablet (Start on 11/11/2021)   Elevated liver function tests       Relevant Orders   CMP14+EGFR   Need for immunization against influenza       Relevant Orders   Flu Vaccine QUAD 6mo+IM (Fluarix, Fluzone & Alfiuria Quad PF) (Completed)     Current medicine, A1c is slightly up at 7.7.  He wants to try diet next 3 months normal follow-up after that.  Follow up plan: Return in about 3 months (around 01/10/2022), or if symptoms worsen or fail to improve, for htn and dm2.  Counseling provided for all of the vaccine components Orders Placed This Encounter  Procedures   Flu Vaccine QUAD 6mo+IM (Fluarix, Fluzone & Alfiuria Quad PF)   Bayer DCA Hb A1c Waived   CMP14+EGFR    Joshua Dettinger, MD [facility] 10/12/2021, 4:42 PM     

## 2021-10-13 LAB — CMP14+EGFR
ALT: 19 IU/L (ref 0–44)
AST: 14 IU/L (ref 0–40)
Albumin/Globulin Ratio: 1.8 (ref 1.2–2.2)
Albumin: 4.2 g/dL (ref 3.8–4.9)
Alkaline Phosphatase: 143 IU/L — ABNORMAL HIGH (ref 44–121)
BUN/Creatinine Ratio: 12 (ref 9–20)
BUN: 12 mg/dL (ref 6–24)
Bilirubin Total: 0.6 mg/dL (ref 0.0–1.2)
CO2: 26 mmol/L (ref 20–29)
Calcium: 9.2 mg/dL (ref 8.7–10.2)
Chloride: 101 mmol/L (ref 96–106)
Creatinine, Ser: 1.04 mg/dL (ref 0.76–1.27)
Globulin, Total: 2.4 g/dL (ref 1.5–4.5)
Glucose: 222 mg/dL — ABNORMAL HIGH (ref 70–99)
Potassium: 5.6 mmol/L — ABNORMAL HIGH (ref 3.5–5.2)
Sodium: 139 mmol/L (ref 134–144)
Total Protein: 6.6 g/dL (ref 6.0–8.5)
eGFR: 84 mL/min/{1.73_m2} (ref >59)

## 2021-10-19 ENCOUNTER — Other Ambulatory Visit: Payer: Self-pay | Admitting: Family Medicine

## 2021-10-19 DIAGNOSIS — E875 Hyperkalemia: Secondary | ICD-10-CM

## 2021-10-20 ENCOUNTER — Ambulatory Visit (INDEPENDENT_AMBULATORY_CARE_PROVIDER_SITE_OTHER): Payer: Medicare Other | Admitting: Pharmacist

## 2021-10-20 DIAGNOSIS — E785 Hyperlipidemia, unspecified: Secondary | ICD-10-CM

## 2021-10-20 DIAGNOSIS — Z794 Long term (current) use of insulin: Secondary | ICD-10-CM

## 2021-10-20 NOTE — Progress Notes (Signed)
Chronic Care Management Pharmacy Note  10/20/2021 Name:  [name] MRN:  [id number] DOB:  July 05, 1966  Summary: T2DM, HTN  Recommendations/Changes made from today's visit:  Diabetes: Uncontrolled (A1C 7.1%--IMPROVING); current treatment: TRESIBA 50-70 UNITS DAILY, OZEMIPIC 0.5MG SQ WEEKLY;  Received notification from [place] regarding 2023 RE-ENROLLMENT approval for [place]. Patient assistance approved from 11/06/21 to 11/05/22.  NEXT SHIPMENTS WILL GO OUT IN JAN 2023 Phone: [phone] MEDICATION TO SHIP TO PCP OFFICE PER PROGRAM RULES USING ANYWHERE FROM 60-70 UNITS OF TRESIBA DAILY--WOULD LIKE TO OPTIMIZE GLP1 AND REDUCE INSULIN INCREASE OZEMPIC TO 1MG SQ WEEKLY WILL HAVE TO AWAIT NEW SUPPLY FROM PATIENT ASSISTANCE-->DOSE CHANGE REQUEST FORM SUBMITTED TO [facility] [place] FOR OZEMPIC 1MG Denies personal and family history of Medullary thyroid cancer (MTC) USES LIBRE CGM--CALLED IN LIBRE 2 cgm SYSTEM TO [facility] (ESCRIBED) Current glucose readings: fasting glucose: <150, post prandial glucose: <200 Denies hypoglycemic/hyperglycemic symptoms Discussed meal planning options and Plate method for healthy eating Avoid sugary drinks and desserts Incorporate balanced protein, non starchy veggies, 1 serving of carbohydrate with each meal Increase water intake Increase physical activity as able Current exercise: N/A Recommended POTENTIAL INCREASE IN GLP1 (OZEMPIC); DECREASE IN INSULIN Assessed patient finances. PATIENT RE-ENROLLED FOR [facility] [place] 2023  (TRESIBA AND OZEMPIC)  -DISCUSSED BLOOD PRESSURE-- Would benefit from ACEi/ARB, will address with PCP at patient's next visit.  ON METOPROLOL FOR AFIB PER CARDS He was previously on lisinopril per chart notes: "BP was soft last month, therefore his PCP discontinued Lisinopril.  He was discharged on Cardizem CD but refills were not provided, therefore he discontinued this several weeks ago.  BP is at 108/69  during today's visit.  Patient Goals/Self-Care Activities Over the next 90 days, patient will:  - take medications as prescribed check glucose USING LIBRE CGM, document, and provide at future appointments collaborate with provider on medication access solutions  Follow Up Plan: Telephone follow up appointment with care management team member scheduled for: 1 MONTH  Subjective: [name] is an 55 y.o. year old male who is a primary patient of Dettinger, [name], MD.  The CCM team was consulted for assistance with disease management and care coordination needs.    Engaged with patient by telephone for follow up visit in response to provider referral for pharmacy case management and/or care coordination services.   Consent to Services:  The patient was given information about Chronic Care Management services, agreed to services, and gave verbal consent prior to initiation of services.  Please see initial visit note for detailed documentation.   Patient Care Team: Dettinger, [name], MD as PCP - General (Family Medicine) Satira Sark, MD as PCP - Cardiology (Cardiology) Lavera Guise, [facility] (Pharmacist) Irene Shipper, MD as Consulting Physician (Gastroenterology) Ralene Muskrat as Physician Assistant (Chiropractic Medicine) Celestia Khat, OD (Optometry)  Objective:  Lab Results  Component Value Date   CREATININE 1.04 10/12/2021   CREATININE 1.11 07/14/2021   CREATININE 0.98 12/03/2020    Lab Results  Component Value Date   HGBA1C 7.7 (H) 10/12/2021   Last diabetic Eye exam:  Lab Results  Component Value Date/Time   HMDIABEYEEXA No Retinopathy 08/18/2020 12:00 AM    Last diabetic Foot exam: No results found for: HMDIABFOOTEX      Component Value Date/Time   CHOL 91 (L) 07/14/2021 1042   TRIG 146 07/14/2021 1042   HDL 29 (L) 07/14/2021 1042   CHOLHDL 3.1 07/14/2021 1042  CHOLHDL 4.6 01/30/2018 1347   VLDL 54 (H) 01/30/2018 1347   LDLCALC 37 07/14/2021  1042    Hepatic Function Latest Ref Rng & Units 10/12/2021 07/14/2021 12/03/2020  Total Protein 6.0 - 8.5 g/dL 6.6 6.5 6.8  Albumin 3.8 - 4.9 g/dL 4.2 4.2 4.2  AST 0 - 40 IU/L 14 46(H) 20  ALT 0 - 44 IU/L 19 61(H) 37  Alk Phosphatase 44 - 121 IU/L 143(H) 138(H) 142(H)  Total Bilirubin 0.0 - 1.2 mg/dL 0.6 0.7 0.5    Lab Results  Component Value Date/Time   TSH 0.961 12/03/2020 11:18 AM   TSH 0.929 10/23/2019 03:59 AM   TSH 1.490 03/09/2014 11:09 AM    CBC Latest Ref Rng & Units 07/14/2021 12/03/2020 04/23/2020  WBC 3.4 - 10.8 x10E3/uL 6.8 7.0 4.8  Hemoglobin 13.0 - 17.7 g/dL 15.7 16.3 14.5  Hematocrit 37.5 - 51.0 % 46.0 48.7 42.8  Platelets 150 - 450 x10E3/uL 249 219 225    No results found for: VD25OH  Clinical ASCVD: No  The ASCVD Risk score (Arnett DK, et al., 2019) failed to calculate for the following reasons:   The valid total cholesterol range is 130 to 320 mg/dL    Other: (CHADS2VASc if Afib, PHQ9 if depression, MMRC or CAT for COPD, ACT, DEXA)  Social History   Tobacco Use  Smoking Status Never  Smokeless Tobacco Never   BP Readings from Last 3 Encounters:  10/12/21 (!) 142/80  08/02/21 129/79  07/25/21 (!) 142/82   Pulse Readings from Last 3 Encounters:  10/12/21 79  08/02/21 72  07/25/21 69   Wt Readings from Last 3 Encounters:  10/12/21 215 lb (97.5 kg)  08/02/21 217 lb (98.4 kg)  07/26/21 220 lb (99.8 kg)    Assessment: Review of patient past medical history, allergies, medications, health status, including review of consultants reports, laboratory and other test data, was performed as part of comprehensive evaluation and provision of chronic care management services.   SDOH:  (Social Determinants of Health) assessments and interventions performed:    CCM Care Plan  No Known Allergies  Medications Reviewed Today     Reviewed by Lavera Guise, [facility] (Pharmacist) on 10/20/21 at 33  Med List Status: <None>   Medication Order Taking? Sig  Documenting Provider Last Dose Status Informant  albuterol (PROVENTIL HFA;VENTOLIN HFA) 108 (90 Base) MCG/ACT inhaler [id number] No Inhale 2 puffs into the lungs every 6 (six) hours as needed for wheezing or shortness of breath. Dettinger, [name], MD Taking Active Self  apixaban (ELIQUIS) 5 MG TABS tablet [id number] No Take 1 tablet (5 mg total) by mouth 2 (two) times daily. Satira Sark, MD Taking Active   atorvastatin (LIPITOR) 20 MG tablet [id number] No Take 1 tablet (20 mg total) by mouth daily. Dettinger, [name], MD Taking Active   Continuous Blood Gluc Receiver (FREESTYLE LIBRE 2 READER) DEVI [id number] No Use as directed to test blood sugar up to 6 times daily as directed. DX: E 11.9 Dettinger, [name], MD Taking Active   Continuous Blood Gluc Sensor (FREESTYLE LIBRE 2 SENSOR) MISC [id number] No Use as directed to test blood sugar up to 6 times daily as directed. DX: E 11.9 Dettinger, [name], MD Taking Active   DULoxetine (CYMBALTA) 60 MG capsule [id number] No Take 1 capsule (60 mg total) by mouth daily. Dettinger, [name], MD Taking Active   fluticasone [facility]) 50 MCG/ACT nasal spray [id number] No Place 1 spray into both nostrils 2 (two)  times daily as needed for allergies or rhinitis. Dettinger, [name], MD Taking Active Self  gabapentin (NEURONTIN) 600 MG tablet [id number] No TAKE 1 TABLET BY MOUTH THREE TIMES A DAY Dettinger, [name], MD Taking Active   HYDROcodone-acetaminophen [facility]) 10-325 MG tablet [id number]  Take 1 tablet by mouth 2 (two) times daily as needed. Dettinger, [name], MD  Active   HYDROcodone-acetaminophen [facility]) 10-325 MG tablet [id number]  Take 1 tablet by mouth 2 (two) times daily as needed. Dettinger, [name], MD  Active   HYDROcodone-acetaminophen [facility]) 10-325 MG tablet [id number]  Take 1 tablet by mouth 2 (two) times daily as needed. Dettinger, [name], MD  Active   insulin degludec (TRESIBA FLEXTOUCH) 100 UNIT/ML FlexTouch Pen [id number] No Inject 50-70  Units into the skin daily. [provider] Taking Active            Med Note Blanca Friend, Royce Macadamia   Tue Jun 28, 2021 11:55 AM) VIA [facility] [place] PATIENT ASSISTANCE PROGRAM  metFORMIN (GLUCOPHAGE) 1000 MG tablet [id number] No Take 1 tablet (1,000 mg total) by mouth 2 (two) times daily. Dettinger, [name], MD Taking Active   metoprolol succinate (TOPROL-XL) 25 MG 24 hr tablet [id number] No TAKE 1 TABLET BY MOUTH EVERY DAY Satira Sark, MD Taking Active   Multiple Vitamins-Minerals (MULTIVITAMIN WITH MINERALS) tablet [id number] No Take 1 tablet by mouth daily. [provider] Taking Active Self  Semaglutide,0.25 or 0.5MG/DOS, (OZEMPIC, 0.25 OR 0.5 MG/DOSE,) 2 MG/1.5ML SOPN [id number] No Inject 0.5 mg into the skin once a week. 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[place])    Abscess of tendon sheath, right ankle and foot 12/24/2019   Bacteremia due to methicillin susceptible Staphylococcus aureus (MSSA) 12/24/2019   History of DVT (deep vein thrombosis) 10/26/2019   A-fib ([place]) 10/22/2019   Lumbar spine scoliosis 08/13/2018   S/P BKA (below knee amputation) unilateral, left ([place]) 01/12/2018   Pain management contract signed 05/04/2017   Type 2 diabetes mellitus ([place]) 07/22/2015   Neuropathy in diabetes ([place]) 03/31/2014   Essential hypertension, benign 03/03/2013   DDD (degenerative disc disease), lumbar 03/03/2013    Immunization History  Administered Date(s) Administered   Influenza, Seasonal, Injecte, Preservative Fre 10/15/2015, 08/05/2016   Influenza,inj,Quad PF,6+ Mos 10/15/2015, 08/05/2016, 08/21/2017, 08/07/2018, 07/22/2019, 09/01/2020, 10/12/2021   Moderna  Sars-Covid-2 Vaccination 05/20/2020, 06/14/2020   Pneumococcal Conjugate-13 07/22/2019, 07/08/2020   Tdap 07/08/2020   Zoster Recombinat (Shingrix) 06/30/2019, 10/06/2019    Conditions to be addressed/monitored: DMII  Care Plan : PHARMD MEDICATION MANAGEMENT  Updates made by Lavera Guise, [place] since 10/20/2021 12:00 AM     Problem: DISEASE PROGRESSION PREVENTION      Long-Range Goal: T2DM, HTN   Recent Progress: Not on track  Priority: High  Note:   Current Barriers:  Unable to independently afford treatment regimen Unable to maintain control of T2DM  Pharmacist Clinical Goal(s):  Over the next 90 days, patient will verbalize ability to afford treatment regimen maintain control of T2DM as evidenced by IMPROVED GLYCEMIC CONTROL  through collaboration with PharmD and provider.  Interventions: 1:1 collaboration with Dettinger, [name], MD regarding development and update of comprehensive plan of care as evidenced by provider attestation and co-signature Inter-disciplinary care team collaboration (see longitudinal plan of care) Comprehensive medication review performed; medication list updated in electronic medical record  Diabetes: Uncontrolled (A1C 7.1%--IMPROVING); current treatment: TRESIBA 50-70 UNITS DAILY, OZEMIPIC 0.5MG SQ WEEKLY;  Received notification from [place] regarding 2023 RE-ENROLLMENT approval for [place]. Patient assistance approved from 11/06/21 to 11/05/22.  NEXT SHIPMENTS WILL GO OUT IN JAN 2023 Phone: [phone] MEDICATION TO SHIP TO PCP OFFICE PER PROGRAM RULES USING ANYWHERE FROM 60-70 UNITS OF TRESIBA DAILY--WOULD LIKE TO OPTIMIZE GLP1 AND REDUCE INSULIN INCREASE OZEMPIC TO 1MG SQ WEEKLY WILL HAVE TO AWAIT NEW SUPPLY FROM PATIENT ASSISTANCE-->DOSE CHANGE REQUEST FORM SUBMITTED TO [facility] [place] FOR OZEMPIC 1MG Denies personal and family history of Medullary thyroid cancer (MTC) USES LIBRE CGM--CALLED IN LIBRE 2 cgm SYSTEM TO [facility] (ESCRIBED) Current glucose readings: fasting glucose: <150, post prandial glucose: <200 Denies hypoglycemic/hyperglycemic symptoms Discussed meal planning options and Plate method for healthy eating Avoid sugary drinks and desserts Incorporate balanced protein, non starchy veggies, 1 serving of carbohydrate with each meal Increase water intake Increase physical activity as able Current exercise: N/A Recommended POTENTIAL INCREASE IN GLP1 (OZEMPIC); DECREASE IN INSULIN Assessed patient finances. PATIENT RE-ENROLLED FOR [facility] [place] 2023  (TRESIBA AND OZEMPIC)  -DISCUSSED BLOOD PRESSURE-- Would benefit from ACEi/ARB, will address with PCP at patient's next visit.  ON METOPROLOL FOR AFIB PER CARDS He was previously on lisinopril per chart notes: "BP was soft last month, therefore his PCP discontinued Lisinopril.  He was discharged on Cardizem CD but refills were not provided, therefore he discontinued this several weeks ago.  BP is at 108/69 during today's visit.  Patient Goals/Self-Care Activities Over the next 90 days, patient will:  - take medications as prescribed check glucose USING LIBRE CGM, document, and provide at future appointments collaborate with provider on medication access solutions  Follow Up Plan: Telephone follow up appointment with care management team member scheduled for: 1 MONTH      Medication Assistance:  TRESIBA, OZEMPIC obtained through [place]  medication assistance program.  Enrollment ends 10/05/22  Patient's preferred pharmacy is:  [id number] [place]Phone: [phone]: [phone] [facility] [address] [address]Phone: [phone]: [phone] [facility] [place]- [address] HospitalDr [address]Phone:  [phone]: [phone] [place]Phone: [phone]: [phone] Uses pill box? No - N/A Pt endorses 100% compliance  Follow Up:  Patient agrees to Care Plan and Follow-up.  Plan: Telephone follow up appointment with care management team member scheduled for:  1 MONTH   JRegina Eck PharmD, BCPS Clinical Pharmacist, [place] Phone [phone]

## 2021-10-20 NOTE — Patient Instructions (Signed)
Visit Information  Thank you for taking time to visit with me today. Please don't hesitate to contact me if I can be of assistance to you before our next scheduled telephone appointment.  Following are the goals we discussed today:  Current Barriers:  Unable to independently afford treatment regimen Unable to maintain control of T2DM  Pharmacist Clinical Goal(s):  Over the next 90 days, patient will verbalize ability to afford treatment regimen maintain control of T2DM as evidenced by IMPROVED GLYCEMIC CONTROL  through collaboration with PharmD and provider.   Interventions: 1:1 collaboration with Dettinger, Elige Radon, MD regarding development and update of comprehensive plan of care as evidenced by provider attestation and co-signature Inter-disciplinary care team collaboration (see longitudinal plan of care) Comprehensive medication review performed; medication list updated in electronic medical record  Diabetes: Uncontrolled (A1C 7.1%--IMPROVING); current treatment: TRESIBA 50-70 UNITS DAILY, OZEMIPIC 0.5MG  SQ WEEKLY;  Received notification from [facility] regarding 2023 RE-ENROLLMENT approval for TRESIBA & OZEMPIC. Patient assistance approved from 11/06/21 to 11/05/22.  NEXT SHIPMENTS WILL GO OUT IN JAN 2023 Phone: [phone] MEDICATION TO SHIP TO PCP OFFICE PER PROGRAM RULES USING ANYWHERE FROM 60-70 UNITS OF TRESIBA DAILY--WOULD LIKE TO OPTIMIZE GLP1 AND REDUCE INSULIN INCREASE OZEMPIC TO 1MG  SQ WEEKLY WILL HAVE TO AWAIT NEW SUPPLY FROM PATIENT ASSISTANCE-->DOSE CHANGE REQUEST FORM SUBMITTED TO [facility] FOR OZEMPIC 1MG  Denies personal and family history of Medullary thyroid cancer (MTC) USES LIBRE CGM--CALLED IN LIBRE 2 cgm SYSTEM TO [facility] (ESCRIBED) Current glucose readings: fasting glucose: <150, post prandial glucose: <200 Denies hypoglycemic/hyperglycemic symptoms Discussed meal planning options and Plate method for healthy eating Avoid sugary drinks and  desserts Incorporate balanced protein, non starchy veggies, 1 serving of carbohydrate with each meal Increase water intake Increase physical activity as able Current exercise: N/A Recommended POTENTIAL INCREASE IN GLP1 (OZEMPIC); DECREASE IN INSULIN Assessed patient finances. PATIENT RE-ENROLLED FOR [facility] 2023  (TRESIBA AND OZEMPIC)  -DISCUSSED BLOOD PRESSURE-- Would benefit from ACEi/ARB, will address with PCP at patient's next visit.  ON METOPROLOL FOR AFIB PER CARDS He was previously on lisinopril per chart notes: "BP was soft last month, therefore his PCP discontinued Lisinopril.  He was discharged on Cardizem CD but refills were not provided, therefore he discontinued this several weeks ago.  BP is at 108/69 during today's visit.  Patient Goals/Self-Care Activities Over the next 90 days, patient will:  - take medications as prescribed check glucose USING LIBRE CGM, document, and provide at future appointments collaborate with provider on medication access solutions  Follow Up Plan: Telephone follow up appointment with care management team member scheduled for: 1 MONTH   Please call the care guide team at [phone] if you need to cancel or reschedule your appointment.   The patient verbalized understanding of instructions, educational materials, and care plan provided today and declined offer to receive copy of patient instructions, educational materials, and care plan.   2024, PharmD, BCPS Clinical Pharmacist, [facility]  II Phone [phone]

## 2021-10-25 ENCOUNTER — Other Ambulatory Visit: Payer: Medicare Other

## 2021-10-25 DIAGNOSIS — E875 Hyperkalemia: Secondary | ICD-10-CM

## 2021-10-26 LAB — BMP8+EGFR
BUN/Creatinine Ratio: 15 (ref 9–20)
BUN: 14 mg/dL (ref 6–24)
CO2: 27 mmol/L (ref 20–29)
Calcium: 9.1 mg/dL (ref 8.7–10.2)
Chloride: 104 mmol/L (ref 96–106)
Creatinine, Ser: 0.96 mg/dL (ref 0.76–1.27)
Glucose: 165 mg/dL — ABNORMAL HIGH (ref 70–99)
Potassium: 4.8 mmol/L (ref 3.5–5.2)
Sodium: 145 mmol/L — ABNORMAL HIGH (ref 134–144)
eGFR: 92 mL/min/{1.73_m2} (ref >59)

## 2021-11-04 ENCOUNTER — Telehealth: Payer: Self-pay | Admitting: Family Medicine

## 2021-11-04 NOTE — Telephone Encounter (Signed)
If we have sample, please get for patient He needs to back down to taking 0.5mg  on sample (company does not give [place] 1mg  samples and it runs out too quick)  If not, his ozempic is on backorder from patient assistance so it could be 30 days or more He may need to fill at local pharmacy 1st of the year under his insurance

## 2021-11-04 NOTE — Telephone Encounter (Signed)
Patient aware we have no samples and aware to get a local pharmacy.

## 2021-11-05 DIAGNOSIS — E1122 Type 2 diabetes mellitus with diabetic chronic kidney disease: Secondary | ICD-10-CM

## 2021-11-05 DIAGNOSIS — Z794 Long term (current) use of insulin: Secondary | ICD-10-CM

## 2021-11-05 DIAGNOSIS — E785 Hyperlipidemia, unspecified: Secondary | ICD-10-CM

## 2021-11-07 DIAGNOSIS — Z794 Long term (current) use of insulin: Secondary | ICD-10-CM | POA: Diagnosis not present

## 2021-11-07 DIAGNOSIS — E1165 Type 2 diabetes mellitus with hyperglycemia: Secondary | ICD-10-CM | POA: Diagnosis not present

## 2021-11-09 ENCOUNTER — Other Ambulatory Visit: Payer: Self-pay | Admitting: Family Medicine

## 2021-11-09 DIAGNOSIS — E1142 Type 2 diabetes mellitus with diabetic polyneuropathy: Secondary | ICD-10-CM

## 2021-11-14 ENCOUNTER — Encounter: Payer: Self-pay | Admitting: Nurse Practitioner

## 2021-11-14 ENCOUNTER — Ambulatory Visit (INDEPENDENT_AMBULATORY_CARE_PROVIDER_SITE_OTHER): Payer: Medicare Other | Admitting: Nurse Practitioner

## 2021-11-14 DIAGNOSIS — L089 Local infection of the skin and subcutaneous tissue, unspecified: Secondary | ICD-10-CM

## 2021-11-14 MED ORDER — CEPHALEXIN 500 MG PO CAPS: 500.0000 mg | ORAL_CAPSULE | Freq: Two times a day (BID) | ORAL | 0 refills | Status: DC

## 2021-11-14 NOTE — Progress Notes (Signed)
Virtual Visit  Note Due to COVID-19 pandemic this visit was conducted virtually. This visit type was conducted due to national recommendations for restrictions regarding the COVID-19 Pandemic (e.g. social distancing, sheltering in place) in an effort to limit this patient's exposure and mitigate transmission in our community. All issues noted in this document were discussed and addressed.  A physical exam was not performed with this format.  I connected with [name] on 11/14/21 at 10:16 am  by telephone and verified that I am speaking with the correct person using two identifiers. [name] is currently located at home during visit. The provider, Daryll Drown, NP is located in their office at time of visit.  I discussed the limitations, risks, security and privacy concerns of performing an evaluation and management service by telephone and the availability of in person appointments. I also discussed with the patient that there may be a patient responsible charge related to this service. The patient expressed understanding and agreed to proceed.   History and Present Illness:  HPI  Patient presents with skin infection/abscess right inner cheek and back of neck.  This is not new for patient.  In the past treated patient was treated with antibiotic.  No fever associated with current symptoms.  Patient reports mild to moderate pain.  Review of Systems  Constitutional: Negative.   HENT: Negative.    Respiratory: Negative.    Cardiovascular: Negative.   Gastrointestinal: Negative.   Skin:        Abscess  All other systems reviewed and are negative.   Observations/Objective: Televisit patient not in distress  Assessment and Plan:  Infected abscess right  cheek and back of neck. Patient reports this is occurs quite frequently and has been treated in the past with antibiotics.  -We reviewed the etiology of recurrent abscesses of skin.  Skin abscesses are collections of pus  within the dermis and deeper skin tissues. Skin abscesses manifest as painful, tender, fluctuant, and erythematous nodules, frequently surmounted by a pustule and surrounded by a rim of erythematous swelling.  Spontaneous drainage of purulent material may occur.  Fever can occur on occasion.   -Skin abscesses can develop in healthy individuals with no predisposing conditions other than skin or nasal carriage of Staphylococcus aureus.  Individuals in close contact with others who have active infection with skin abscesses, In addition, any process leading to a breach in the skin barrier can also predispose to the development of a skin abscesses, such as atopic dermatitis.   Patient verbalized understanding.  Keflex 500 mg tablet by mouth for 7 days.    Follow Up Instructions: Follow-up with worsening unresolved symptoms    I discussed the assessment and treatment plan with the patient. The patient was provided an opportunity to ask questions and all were answered. The patient agreed with the plan and demonstrated an understanding of the instructions.   The patient was advised to call back or seek an in-person evaluation if the symptoms worsen or if the condition fails to improve as anticipated.  The above assessment and management plan was discussed with the patient. The patient verbalized understanding of and has agreed to the management plan. Patient is aware to call the clinic if symptoms persist or worsen. Patient is aware when to return to the clinic for a follow-up visit. Patient educated on when it is appropriate to go to the emergency department.   Time call ended: 10:27 AM  I provided 8 minutes of  non face-to-face time during this encounter.    Daryll Drown, NP

## 2021-11-16 ENCOUNTER — Telehealth: Payer: Self-pay | Admitting: Pharmacist

## 2021-11-16 NOTE — Telephone Encounter (Signed)
Pt is aware and will come pick up today.

## 2021-11-16 NOTE — Telephone Encounter (Signed)
Please let patient know:  #2 boxes of Ozempic is in the fridge (side door)for pick up via patient assistance program  They sent the 0.5mg  weekly dose His new dose of 1mg  weekly has not arrived due to backorder.  We will call him when it arrives

## 2021-11-17 ENCOUNTER — Ambulatory Visit (INDEPENDENT_AMBULATORY_CARE_PROVIDER_SITE_OTHER): Payer: Medicare Other | Admitting: Pharmacist

## 2021-11-17 VITALS — BP 115/80

## 2021-11-17 DIAGNOSIS — E1122 Type 2 diabetes mellitus with diabetic chronic kidney disease: Secondary | ICD-10-CM

## 2021-11-17 DIAGNOSIS — Z794 Long term (current) use of insulin: Secondary | ICD-10-CM

## 2021-11-17 DIAGNOSIS — I10 Essential (primary) hypertension: Secondary | ICD-10-CM

## 2021-11-17 NOTE — Progress Notes (Signed)
Chronic Care Management Pharmacy Note  11/17/2021 Name:  [name] MRN:  [id number] DOB:  [birth year]  Summary: T2DM, HTN  Recommendations/Changes made from today's visit:  Diabetes: Uncontrolled (A1C 7.7%--SLIGHT INCREASE--ADMITS TO EATING BREAD AND POPTARTS) Current treatment: TRESIBA 68-72 UNITS DAILY, OZEMPIC 1MG SQ WEEKLY;  Received notification from [place] regarding 2023 RE-ENROLLMENT approval for [place]. Patient assistance approved from 11/06/21 to 11/05/22.  NEXT SHIPMENTS WILL GO OUT IN JAN 2023 Phone: [phone] MEDICATION TO SHIP TO PCP OFFICE PER PROGRAM RULES CONTINUE OZEMPIC TO 1MG SQ WEEKLY AND TRESIBA 68-72 UNITS DAILY WILL HAVE TO AWAIT NEW SUPPLY FROM PATIENT ASSISTANCE-->DOSE CHANGE REQUEST FORM SUBMITTED TO [facility] [place] FOR OZEMPIC 1MG Denies personal and family history of Medullary thyroid cancer (MTC) USES LIBRE CGM--CALLED IN LIBRE 2 cgm SYSTEM TO [facility] (ESCRIBED) Current glucose readings: fasting glucose: <150, post prandial glucose: <200 Denies hypoglycemic/hyperglycemic symptoms Discussed meal planning options and Plate method for healthy eating--ADMITS TO EATING INCREASED CARBOHYDRATES Avoid sugary drinks and desserts Incorporate balanced protein, non starchy veggies, 1 serving of carbohydrate with each meal Increase water intake Increase physical activity as able Current exercise: N/A Recommended POTENTIAL INCREASE IN GLP1 (OZEMPIC); DECREASE IN INSULIN Assessed patient finances. PATIENT RE-ENROLLED FOR [facility] [place] 2023  ([place])  Hypertension: -DISCUSSED BLOOD PRESSURE-- Would benefit from ACEi/ARB for diabetes, will address with PCP at patient's next visit.  ON METOPROLOL FOR AFIB PER CARDS He was previously on lisinopril per chart notes: "BP was soft last month, therefore his PCP discontinued Lisinopril.  He was discharged on Cardizem CD but refills were not provided, therefore he discontinued this  several weeks ago.  BP is at goal <130/80  Patient Goals/Self-Care Activities Over the next 90 days, patient will:  - take medications as prescribed check glucose USING LIBRE CGM, document, and provide at future appointments collaborate with provider on medication access solutions  Follow Up Plan: Telephone follow up appointment with care management team member scheduled for: 01/2022  Subjective: [name] is an 56 y.o. year old male who is a primary patient of Dettinger, [name], MD.  The CCM team was consulted for assistance with disease management and care coordination needs.    Engaged with patient by telephone for follow up visit in response to provider referral for pharmacy case management and/or care coordination services.   Consent to Services:  The patient was given information about Chronic Care Management services, agreed to services, and gave verbal consent prior to initiation of services.  Please see initial visit note for detailed documentation.   Patient Care Team: Dettinger, [name], MD as PCP - General (Family Medicine) Satira Sark, MD as PCP - Cardiology (Cardiology) Lavera Guise, [facility] (Pharmacist) Irene Shipper, MD as Consulting Physician (Gastroenterology) Ralene Muskrat as Physician Assistant (Chiropractic Medicine) Celestia Khat, OD (Optometry)  Objective:  Lab Results  Component Value Date   CREATININE 0.96 10/25/2021   CREATININE 1.04 10/12/2021   CREATININE 1.11 07/14/2021    Lab Results  Component Value Date   HGBA1C 7.7 (H) 10/12/2021   Last diabetic Eye exam:  Lab Results  Component Value Date/Time   HMDIABEYEEXA No Retinopathy 08/18/2020 12:00 AM    Last diabetic Foot exam: No results found for: HMDIABFOOTEX      Component Value Date/Time   CHOL 91 (L) 07/14/2021 1042   TRIG 146 07/14/2021 1042   HDL 29 (L) 07/14/2021 1042   CHOLHDL 3.1 07/14/2021 1042   CHOLHDL 4.6  01/30/2018 1347   VLDL 54 (H) 01/30/2018 1347    LDLCALC 37 07/14/2021 1042    Hepatic Function Latest Ref Rng & Units 10/12/2021 07/14/2021 12/03/2020  Total Protein 6.0 - 8.5 g/dL 6.6 6.5 6.8  Albumin 3.8 - 4.9 g/dL 4.2 4.2 4.2  AST 0 - 40 IU/L 14 46(H) 20  ALT 0 - 44 IU/L 19 61(H) 37  Alk Phosphatase 44 - 121 IU/L 143(H) 138(H) 142(H)  Total Bilirubin 0.0 - 1.2 mg/dL 0.6 0.7 0.5    Lab Results  Component Value Date/Time   TSH 0.961 12/03/2020 11:18 AM   TSH 0.929 10/23/2019 03:59 AM   TSH 1.490 03/09/2014 11:09 AM    CBC Latest Ref Rng & Units 07/14/2021 12/03/2020 04/23/2020  WBC 3.4 - 10.8 x10E3/uL 6.8 7.0 4.8  Hemoglobin 13.0 - 17.7 g/dL 15.7 16.3 14.5  Hematocrit 37.5 - 51.0 % 46.0 48.7 42.8  Platelets 150 - 450 x10E3/uL 249 219 225    No results found for: VD25OH  Clinical ASCVD: No  The ASCVD Risk score (Arnett DK, et al., 2019) failed to calculate for the following reasons:   The valid total cholesterol range is 130 to 320 mg/dL    Other: (CHADS2VASc if Afib, PHQ9 if depression, MMRC or CAT for COPD, ACT, DEXA)  Social History   Tobacco Use  Smoking Status Never  Smokeless Tobacco Never   BP Readings from Last 3 Encounters:  10/12/21 (!) 142/80  08/02/21 129/79  07/25/21 (!) 142/82   Pulse Readings from Last 3 Encounters:  10/12/21 79  08/02/21 72  07/25/21 69   Wt Readings from Last 3 Encounters:  10/12/21 215 lb (97.5 kg)  08/02/21 217 lb (98.4 kg)  07/26/21 220 lb (99.8 kg)    Assessment: Review of patient past medical history, allergies, medications, health status, including review of consultants reports, laboratory and other test data, was performed as part of comprehensive evaluation and provision of chronic care management services.   SDOH:  (Social Determinants of Health) assessments and interventions performed:    CCM Care Plan  No Known Allergies  Medications Reviewed Today     Reviewed by Lavera Guise, [facility] (Pharmacist) on 11/17/21 at [place] List Status: <None>   Medication  Order Taking? Sig Documenting Provider Last Dose Status Informant  apixaban (ELIQUIS) 5 MG TABS tablet [id number] No Take 1 tablet (5 mg total) by mouth 2 (two) times daily. Satira Sark, MD Taking Active   atorvastatin (LIPITOR) 20 MG tablet [id number] No Take 1 tablet (20 mg total) by mouth daily. Dettinger, [name], MD Taking Active   cephALEXin (KEFLEX) 500 MG capsule [id number]  Take 1 capsule (500 mg total) by mouth 2 (two) times daily. Ivy Lynn, NP  Active   Continuous Blood Gluc Receiver (FREESTYLE LIBRE 2 READER) DEVI [id number] No Use as directed to test blood sugar up to 6 times daily as directed. DX: E 11.9 Dettinger, [name], MD Taking Active   Continuous Blood Gluc Sensor (FREESTYLE LIBRE 2 SENSOR) MISC [id number] No Use as directed to test blood sugar up to 6 times daily as directed. DX: E 11.9 Dettinger, [name], MD Taking Active   DULoxetine (CYMBALTA) 60 MG capsule [id number] No Take 1 capsule (60 mg total) by mouth daily. Dettinger, [name], MD Taking Active   fluticasone [facility]) 50 MCG/ACT nasal spray [id number] No Place 1 spray into both nostrils 2 (two) times daily as needed for allergies or rhinitis. Dettinger, [name], MD  Taking Active Self  gabapentin (NEURONTIN) 600 MG tablet [id number]  TAKE 1 TABLET BY MOUTH THREE TIMES A DAY Dettinger, [name], MD  Active   HYDROcodone-acetaminophen [facility]) 10-325 MG tablet [id number]  Take 1 tablet by mouth 2 (two) times daily as needed. Dettinger, [name], MD  Active   HYDROcodone-acetaminophen [facility]) 10-325 MG tablet [id number]  Take 1 tablet by mouth 2 (two) times daily as needed. Dettinger, [name], MD  Active   HYDROcodone-acetaminophen [facility]) 10-325 MG tablet [id number]  Take 1 tablet by mouth 2 (two) times daily as needed. Dettinger, [name], MD  Active   insulin degludec (TRESIBA FLEXTOUCH) 100 UNIT/ML FlexTouch Pen [id number] No Inject 50-70 Units into the skin daily. [provider] Taking Active             Med Note Blanca Friend, Royce Macadamia   Tue Jun 28, 2021 11:55 AM) VIA [facility] PATIENT ASSISTANCE PROGRAM  metFORMIN (GLUCOPHAGE) 1000 MG tablet [id number] No Take 1 tablet (1,000 mg total) by mouth 2 (two) times daily. Dettinger, [name], MD Taking Active   metoprolol succinate (TOPROL-XL) 25 MG 24 hr tablet [id number] No TAKE 1 TABLET BY MOUTH EVERY DAY Satira Sark, MD Taking Active   Multiple Vitamins-Minerals (MULTIVITAMIN WITH MINERALS) tablet [id number] No Take 1 tablet by mouth daily. [provider] Taking Active Self  Semaglutide,0.25 or 0.5MG/DOS, (OZEMPIC, 0.25 OR 0.5 MG/DOSE,) 2 MG/1.5ML SOPN [id number] No Inject 0.5 mg into the skin once a week.  Patient taking differently: Inject 1 mg into the skin once a week.   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[place])    Abscess of tendon sheath, right ankle and foot 12/24/2019   Bacteremia due to methicillin susceptible Staphylococcus aureus (MSSA) 12/24/2019   History of DVT (deep vein thrombosis) 10/26/2019   A-fib ([place]) 10/22/2019   Lumbar spine scoliosis 08/13/2018   S/P BKA (below knee amputation) unilateral, left ([place]) 01/12/2018   Pain management contract signed 05/04/2017   Type 2 diabetes mellitus ([place]) 07/22/2015   Neuropathy in diabetes ([place]) 03/31/2014   Essential hypertension, benign 03/03/2013   DDD (degenerative disc disease), lumbar 03/03/2013    Immunization History  Administered Date(s) Administered   Influenza, Seasonal, Injecte, Preservative Fre 10/15/2015, 08/05/2016   Influenza,inj,Quad PF,6+ Mos 10/15/2015, 08/05/2016, 08/21/2017, 08/07/2018, 07/22/2019, 09/01/2020, 10/12/2021   Moderna Sars-Covid-2  Vaccination 05/20/2020, 06/14/2020   Pneumococcal Conjugate-13 07/22/2019, 07/08/2020   Tdap 07/08/2020   Zoster Recombinat (Shingrix) 06/30/2019, 10/06/2019    Conditions to be addressed/monitored: HTN and DMII  Care Plan : PHARMD MEDICATION MANAGEMENT  Updates made by Lavera Guise, RPH since 11/17/2021 12:00 AM     Problem: DISEASE PROGRESSION PREVENTION      Long-Range Goal: T2DM, HTN   Recent Progress: Not on track  Priority: High  Note:   Current Barriers:  Unable to independently afford treatment regimen Unable to maintain control of T2DM  Pharmacist Clinical Goal(s):  Over the next 90 days, patient will verbalize ability to afford treatment regimen maintain control of T2DM as evidenced by IMPROVED GLYCEMIC CONTROL  through collaboration  with PharmD and provider.   Interventions: 1:1 collaboration with Dettinger, [name], MD regarding development and update of comprehensive plan of care as evidenced by provider attestation and co-signature Inter-disciplinary care team collaboration (see longitudinal plan of care) Comprehensive medication review performed; medication list updated in electronic medical record  Diabetes: Uncontrolled (A1C 7.7%--SLIGHT INCREASE--ADMITS TO EATING BREAD AND POPTARTS) Current treatment: TRESIBA 68-72 UNITS DAILY, OZEMPIC 1MG SQ WEEKLY;  Received notification from [place] regarding 2023 RE-ENROLLMENT approval for [place]. Patient assistance approved from 11/06/21 to 11/05/22.  NEXT SHIPMENTS WILL GO OUT IN JAN 2023 Phone: [phone] MEDICATION TO SHIP TO PCP OFFICE PER PROGRAM RULES CONTINUE OZEMPIC TO 1MG SQ WEEKLY WILL HAVE TO AWAIT NEW SUPPLY FROM PATIENT ASSISTANCE-->DOSE CHANGE REQUEST FORM SUBMITTED TO [facility] [place] FOR OZEMPIC 1MG Denies personal and family history of Medullary thyroid cancer (MTC) USES LIBRE CGM--CALLED IN LIBRE 2 cgm SYSTEM TO [facility] (ESCRIBED) Current glucose readings: fasting glucose:  <150, post prandial glucose: <200 Denies hypoglycemic/hyperglycemic symptoms Discussed meal planning options and Plate method for healthy eating--ADMITS TO EATING INCREASED CARBOHYDRATES Avoid sugary drinks and desserts Incorporate balanced protein, non starchy veggies, 1 serving of carbohydrate with each meal Increase water intake Increase physical activity as able Current exercise: N/A Recommended POTENTIAL INCREASE IN GLP1 (OZEMPIC); DECREASE IN INSULIN Assessed patient finances. PATIENT RE-ENROLLED FOR [facility] [place] 2023  ([place])  Hypertension: -DISCUSSED BLOOD PRESSURE-- Would benefit from ACEi/ARB for diabetes, will address with PCP at patient's next visit.  ON METOPROLOL FOR AFIB PER CARDS He was previously on lisinopril per chart notes: "BP was soft last month, therefore his PCP discontinued Lisinopril.  He was discharged on Cardizem CD but refills were not provided, therefore he discontinued this several weeks ago.  BP is at goal <130/80  Patient Goals/Self-Care Activities Over the next 90 days, patient will:  - take medications as prescribed check glucose USING LIBRE CGM, document, and provide at future appointments collaborate with provider on medication access solutions  Follow Up Plan: Telephone follow up appointment with care management team member scheduled for: 1 MONTH      Medication Assistance:  TRESIBA/OZEMIPCobtained through [place] medication assistance program.  Enrollment ends 11/05/22  Patient's preferred pharmacy is:  [id number] [place]Phone: [phone]: [phone] [facility] [place]- [address]Phone: [phone]: [phone] [facility] [place]- [id number] [address] [place]Phone: [phone]:  [phone] [place]Phone: [phone]: [phone]  Follow Up:  Patient agrees to Care Plan and Follow-up.  Plan: Telephone follow up appointment with care management team member scheduled for:  01/2022   JRegina Eck PharmD, BCPS Clinical Pharmacist, [place] Phone [phone]

## 2021-11-17 NOTE — Patient Instructions (Addendum)
Visit Information  Thank you for taking time to visit with me today. Please don't hesitate to contact me if I can be of assistance to you before our next scheduled telephone appointment.  Following are the goals we discussed today:  Current Barriers:  Unable to independently afford treatment regimen Unable to maintain control of T2DM  Pharmacist Clinical Goal(s):  Over the next 90 days, patient will verbalize ability to afford treatment regimen maintain control of T2DM as evidenced by IMPROVED GLYCEMIC CONTROL  through collaboration with PharmD and provider.   Interventions: 1:1 collaboration with Dettinger, Elige Radon, MD regarding development and update of comprehensive plan of care as evidenced by provider attestation and co-signature Inter-disciplinary care team collaboration (see longitudinal plan of care) Comprehensive medication review performed; medication list updated in electronic medical record  Diabetes: Uncontrolled (A1C 7.7%--SLIGHT INCREASE--ADMITS TO EATING BREAD AND POPTARTS) Current treatment: TRESIBA 68-72 UNITS DAILY, OZEMPIC 1MG  SQ WEEKLY;  Received notification from [employer] [facility] regarding 2023 RE-ENROLLMENT approval for TRESIBA & OZEMPIC. Patient assistance approved from 11/06/21 to 11/05/22.  NEXT SHIPMENTS WILL GO OUT IN JAN 2023 Phone: [phone] MEDICATION TO SHIP TO PCP OFFICE PER PROGRAM RULES CONTINUE OZEMPIC TO 1MG  SQ WEEKLY WILL HAVE TO AWAIT NEW SUPPLY FROM PATIENT ASSISTANCE-->DOSE CHANGE REQUEST FORM SUBMITTED TO [facility] [facility] FOR OZEMPIC 1MG  Denies personal and family history of Medullary thyroid cancer (MTC) USES LIBRE CGM--CALLED IN LIBRE 2 cgm SYSTEM TO [facility] (ESCRIBED) Current glucose readings: fasting glucose: <150, post prandial glucose: <200 Denies hypoglycemic/hyperglycemic symptoms Discussed meal planning options and Plate method for healthy eating--ADMITS TO EATING INCREASED CARBOHYDRATES Avoid sugary drinks and  desserts Incorporate balanced protein, non starchy veggies, 1 serving of carbohydrate with each meal Increase water intake Increase physical activity as able Current exercise: N/A Recommended POTENTIAL INCREASE IN GLP1 (OZEMPIC); DECREASE IN INSULIN Assessed patient finances. PATIENT RE-ENROLLED FOR [facility] 2023  (TRESIBA AND OZEMPIC)  Hypertension: -DISCUSSED BLOOD PRESSURE-- Would benefit from ACEi/ARB for diabetes, will address with PCP at patient's next visit.  ON METOPROLOL FOR AFIB PER CARDS He was previously on lisinopril per chart notes: "BP was soft last month, therefore his PCP discontinued Lisinopril.  He was discharged on Cardizem CD but refills were not provided, therefore he discontinued this several weeks ago.  BP is at goal <130/80  Patient Goals/Self-Care Activities Over the next 90 days, patient will:  - take medications as prescribed check glucose USING LIBRE CGM, document, and provide at future appointments collaborate with provider on medication access solutions  Follow Up Plan: Telephone follow up appointment with care management team member scheduled for: 1 MONTH   Our next appointment is by telephone on 01/2022 at 8:30AM  Please call the care guide team at [phone] if you need to cancel or reschedule your appointment.   If you are experiencing a Mental Health or Behavioral Health Crisis or need someone to talk to, please call the Suicide and Crisis Lifeline: 988   The patient verbalized understanding of instructions, educational materials, and care plan provided today and agreed to receive a mailed copy of patient instructions, educational materials, and care plan.   Signature 02/2022, PharmD, BCPS Clinical Pharmacist, [phone] Family Medicine [facility] [address]  [place] Phone [phone]

## 2021-12-06 DIAGNOSIS — I10 Essential (primary) hypertension: Secondary | ICD-10-CM | POA: Diagnosis not present

## 2021-12-06 DIAGNOSIS — Z794 Long term (current) use of insulin: Secondary | ICD-10-CM

## 2021-12-06 DIAGNOSIS — E1122 Type 2 diabetes mellitus with diabetic chronic kidney disease: Secondary | ICD-10-CM

## 2021-12-08 DIAGNOSIS — Z794 Long term (current) use of insulin: Secondary | ICD-10-CM | POA: Diagnosis not present

## 2021-12-08 DIAGNOSIS — E1165 Type 2 diabetes mellitus with hyperglycemia: Secondary | ICD-10-CM | POA: Diagnosis not present

## 2021-12-27 ENCOUNTER — Telehealth: Payer: Self-pay | Admitting: Family Medicine

## 2021-12-27 ENCOUNTER — Telehealth (INDEPENDENT_AMBULATORY_CARE_PROVIDER_SITE_OTHER): Payer: Medicare Other | Admitting: Family

## 2021-12-27 ENCOUNTER — Encounter: Payer: Self-pay | Admitting: Family

## 2021-12-27 DIAGNOSIS — L0291 Cutaneous abscess, unspecified: Secondary | ICD-10-CM | POA: Diagnosis not present

## 2021-12-27 MED ORDER — SULFAMETHOXAZOLE-TRIMETHOPRIM 800-160 MG PO TABS: 1.0000 | ORAL_TABLET | Freq: Two times a day (BID) | ORAL | 0 refills | Status: DC

## 2021-12-27 NOTE — Telephone Encounter (Signed)
Patient aware no samples at the moment  

## 2021-12-27 NOTE — Progress Notes (Signed)
Virtual Visit Consent   [name], you are scheduled for a virtual visit with a [facility] provider today.     Just as with appointments in the office, your consent must be obtained to participate.  Your consent will be active for this visit and any virtual visit you may have with one of our providers in the next 365 days.     If you have a MyChart account, a copy of this consent can be sent to you electronically.  All virtual visits are billed to your insurance company just like a traditional visit in the office.    As this is a virtual visit, video technology does not allow for your provider to perform a traditional examination.  This may limit your provider's ability to fully assess your condition.  If your provider identifies any concerns that need to be evaluated in person or the need to arrange testing (such as labs, EKG, etc.), we will make arrangements to do so.     Although advances in technology are sophisticated, we cannot ensure that it will always work on either your end or our end.  If the connection with a video visit is poor, the visit may have to be switched to a telephone visit.  With either a video or telephone visit, we are not always able to ensure that we have a secure connection.     I need to obtain your verbal consent now.   Are you willing to proceed with your visit today?    [name] has provided verbal consent on 12/27/2021 for a virtual visit (video or telephone).   Jannifer Rodney, FNP   Date: 12/27/2021 12:44 PM   Virtual Visit via Video Note   I, Jannifer Rodney, connected with  [name]  ([id number], Nov 17, 1965) on 12/27/21 at 12:10 PM EST by a video-enabled telemedicine application and verified that I am speaking with the correct person using two identifiers.  Location: Patient: Virtual Visit Location Patient: Home Provider: Virtual Visit Location Provider: Home Office   I discussed the limitations of evaluation and management by telemedicine  and the availability of in person appointments. The patient expressed understanding and agreed to proceed.    History of Present Illness: [name] is a 56 y.o. who identifies as a male who was assigned male at birth, and is being seen today for abscess on right cheek and neck. He reports he has three hard pustules on this face. He reports they do drain a white thick discharge with a bloody discharge at times.    HPI: HPI  Problems:  Patient Active Problem List   Diagnosis Date Noted   Dyslipidemia 07/14/2021   S/P BKA (below knee amputation) unilateral, right (HCC) 02/19/2020   Osteomyelitis of ankle and foot (HCC)    Abscess of tendon sheath, right ankle and foot 12/24/2019   Bacteremia due to methicillin susceptible Staphylococcus aureus (MSSA) 12/24/2019   History of DVT (deep vein thrombosis) 10/26/2019   A-fib (HCC) 10/22/2019   Lumbar spine scoliosis 08/13/2018   S/P BKA (below knee amputation) unilateral, left (HCC) 01/12/2018   Pain management contract signed 05/04/2017   Type 2 diabetes mellitus (HCC) 07/22/2015   Neuropathy in diabetes (HCC) 03/31/2014   Essential hypertension, benign 03/03/2013   DDD (degenerative disc disease), lumbar 03/03/2013    Allergies: No Known Allergies Medications:  Current Outpatient Medications:    sulfamethoxazole-trimethoprim (BACTRIM DS) 800-160 MG tablet, Take 1 tablet by mouth 2 (two) times daily.,  Disp: 20 tablet, Rfl: 0   apixaban (ELIQUIS) 5 MG TABS tablet, Take 1 tablet (5 mg total) by mouth 2 (two) times daily., Disp: 56 tablet, Rfl: 0   atorvastatin (LIPITOR) 20 MG tablet, Take 1 tablet (20 mg total) by mouth daily., Disp: 90 tablet, Rfl: 3   Continuous Blood Gluc Receiver (FREESTYLE LIBRE 2 READER) DEVI, Use as directed to test blood sugar up to 6 times daily as directed. DX: E 11.9, Disp: 1 each, Rfl: 0   Continuous Blood Gluc Sensor (FREESTYLE LIBRE 2 SENSOR) MISC, Use as directed to test blood sugar up to 6 times daily as  directed. DX: E 11.9, Disp: 6 each, Rfl: 11   DULoxetine (CYMBALTA) 60 MG capsule, Take 1 capsule (60 mg total) by mouth daily., Disp: 90 capsule, Rfl: 3   fluticasone (FLONASE) 50 MCG/ACT nasal spray, Place 1 spray into both nostrils 2 (two) times daily as needed for allergies or rhinitis., Disp: 16 g, Rfl: 6   gabapentin (NEURONTIN) 600 MG tablet, TAKE 1 TABLET BY MOUTH THREE TIMES A DAY, Disp: 90 tablet, Rfl: 2   HYDROcodone-acetaminophen (NORCO) 10-325 MG tablet, Take 1 tablet by mouth 2 (two) times daily as needed., Disp: 60 tablet, Rfl: 0   HYDROcodone-acetaminophen (NORCO) 10-325 MG tablet, Take 1 tablet by mouth 2 (two) times daily as needed., Disp: 60 tablet, Rfl: 0   HYDROcodone-acetaminophen (NORCO) 10-325 MG tablet, Take 1 tablet by mouth 2 (two) times daily as needed., Disp: 60 tablet, Rfl: 0   insulin degludec (TRESIBA FLEXTOUCH) 100 UNIT/ML FlexTouch Pen, Inject 50-70 Units into the skin daily., Disp: , Rfl:    metFORMIN (GLUCOPHAGE) 1000 MG tablet, Take 1 tablet (1,000 mg total) by mouth 2 (two) times daily., Disp: 180 tablet, Rfl: 3   metoprolol succinate (TOPROL-XL) 25 MG 24 hr tablet, TAKE 1 TABLET BY MOUTH EVERY DAY, Disp: 90 tablet, Rfl: 3   Multiple Vitamins-Minerals (MULTIVITAMIN WITH MINERALS) tablet, Take 1 tablet by mouth daily., Disp: , Rfl:    Semaglutide,0.25 or 0.5MG /DOS, (OZEMPIC, 0.25 OR 0.5 MG/DOSE,) 2 MG/1.5ML SOPN, Inject 0.5 mg into the skin once a week. (Patient taking differently: Inject 1 mg into the skin once a week.), Disp: 2 mL, Rfl: 11  Observations/Objective: Patient is well-developed, well-nourished in no acute distress.  Resting comfortably  at home.  Head is normocephalic, atraumatic.  No labored breathing. Speech is clear and coherent with logical content.  Patient is alert and oriented at baseline.  Large pustules   Assessment and Plan: 1. Abscess - sulfamethoxazole-trimethoprim (BACTRIM DS) 800-160 MG tablet; Take 1 tablet by mouth 2 (two)  times daily.  Dispense: 20 tablet; Refill: 0  Keep clean and dry Warm compresses Start bactrim  Do not pick  Good hand hygiene   Follow Up Instructions: I discussed the assessment and treatment plan with the patient. The patient was provided an opportunity to ask questions and all were answered. The patient agreed with the plan and demonstrated an understanding of the instructions.  A copy of instructions were sent to the patient via MyChart unless otherwise noted below.     The patient was advised to call back or seek an in-person evaluation if the symptoms worsen or if the condition fails to improve as anticipated.  Time:  I spent 8 minutes with the patient via telehealth technology discussing the above problems/concerns.    Jannifer Rodney, FNP

## 2021-12-27 NOTE — Patient Instructions (Signed)
Skin Abscess  A skin abscess is an infected area on or under your skin that contains a collection of pus and other material. An abscess may also be called a furuncle,carbuncle, or boil. An abscess can occur in or on almost any part of your body. Some abscesses break open (rupture) on their own. Most continue to get worse unless they are treated. The infection can spread deeper into the body and eventually into your blood, whichcan make you feel ill. Treatment usually involves draining the abscess. What are the causes? An abscess occurs when germs, like bacteria, pass through your skin and cause an infection. This may be caused by: A scrape or cut on your skin. A puncture wound through your skin, including a needle injection or insect bite. Blocked oil or sweat glands. Blocked and infected hair follicles. A cyst that forms beneath your skin (sebaceous cyst) and becomes infected. What increases the risk? This condition is more likely to develop in people who: Have a weak body defense system (immune system). Have diabetes. Have dry and irritated skin. Get frequent injections or use illegal IV drugs. Have a foreign body in a wound, such as a splinter. Have problems with their lymph system or veins. What are the signs or symptoms? Symptoms of this condition include: A painful, firm bump under the skin. A bump with pus at the top. This may break through the skin and drain. Other symptoms include: Redness surrounding the abscess site. Warmth. Swelling of the lymph nodes (glands) near the abscess. Tenderness. A sore on the skin. How is this diagnosed? This condition may be diagnosed based on: A physical exam. Your medical history. A sample of pus. This may be used to find out what is causing the infection. Blood tests. Imaging tests, such as an ultrasound, CT scan, or MRI. How is this treated? A small abscess that drains on its own may not need treatment. Treatment for larger abscesses  may include: Moist heat or heat pack applied to the area several times a day. A procedure to drain the abscess (incision and drainage). Antibiotic medicines. For a severe abscess, you may first get antibiotics through an IV and then change to antibiotics by mouth. Follow these instructions at home: Medicines  Take over-the-counter and prescription medicines only as told by your health care provider. If you were prescribed an antibiotic medicine, take it as told by your health care provider. Do not stop taking the antibiotic even if you start to feel better.  Abscess care  If you have an abscess that has not drained, apply heat to the affected area. Use the heat source that your health care provider recommends, such as a moist heat pack or a heating pad. Place a towel between your skin and the heat source. Leave the heat on for 20-30 minutes. Remove the heat if your skin turns bright red. This is especially important if you are unable to feel pain, heat, or cold. You may have a greater risk of getting burned. Follow instructions from your health care provider about how to take care of your abscess. Make sure you: Cover the abscess with a bandage (dressing). Change your dressing or gauze as told by your health care provider. Wash your hands with soap and water before you change the dressing or gauze. If soap and water are not available, use hand sanitizer. Check your abscess every day for signs of a worsening infection. Check for: More redness, swelling, or pain. More fluid or blood. Warmth. More   pus or a bad smell.  General instructions To avoid spreading the infection: Do not share personal care items, towels, or hot tubs with others. Avoid making skin contact with other people. Keep all follow-up visits as told by your health care provider. This is important. Contact a health care provider if you have: More redness, swelling, or pain around your abscess. More fluid or blood coming  from your abscess. Warm skin around your abscess. More pus or a bad smell coming from your abscess. A fever. Muscle aches. Chills or a general ill feeling. Get help right away if you: Have severe pain. See red streaks on your skin spreading away from the abscess. Summary A skin abscess is an infected area on or under your skin that contains a collection of pus and other material. A small abscess that drains on its own may not need treatment. Treatment for larger abscesses may include having a procedure to drain the abscess and taking an antibiotic. This information is not intended to replace advice given to you by your health care provider. Make sure you discuss any questions you have with your healthcare provider. Document Revised: 02/13/2019 Document Reviewed: 12/06/2017 Elsevier Patient Education  2022 Elsevier Inc.  

## 2022-01-02 ENCOUNTER — Telehealth: Payer: Self-pay | Admitting: Family Medicine

## 2022-01-02 NOTE — Telephone Encounter (Signed)
Samples of Eliquis up front #42 of 5mg  BID   Pt informed. He states that has been helping with his Ozempic. We do not have any in the office at this time. Pt made aware that Raynelle Fanning is off today but will return tomorrow.

## 2022-01-03 MED ORDER — OZEMPIC (0.25 OR 0.5 MG/DOSE) 2 MG/1.5ML ~~LOC~~ SOPN: 0.5000 mg | PEN_INJECTOR | SUBCUTANEOUS | 1 refills | Status: DC

## 2022-01-03 NOTE — Telephone Encounter (Signed)
He can call [phone] All ozempic is delayed up to 6 weeks We will call him when it comes in I would highly encourage patient to fill at least 1 month at the local drug store.  This should be around $47

## 2022-01-03 NOTE — Telephone Encounter (Signed)
Pt has been informed of Julie's recommendations. One month supply of Ozempic sent to [facility] in [place].

## 2022-01-09 DIAGNOSIS — Z794 Long term (current) use of insulin: Secondary | ICD-10-CM | POA: Diagnosis not present

## 2022-01-09 DIAGNOSIS — E1165 Type 2 diabetes mellitus with hyperglycemia: Secondary | ICD-10-CM | POA: Diagnosis not present

## 2022-01-16 ENCOUNTER — Encounter: Payer: Self-pay | Admitting: Family Medicine

## 2022-01-16 ENCOUNTER — Telehealth: Payer: Self-pay

## 2022-01-16 ENCOUNTER — Ambulatory Visit (INDEPENDENT_AMBULATORY_CARE_PROVIDER_SITE_OTHER): Payer: Medicare Other | Admitting: Family Medicine

## 2022-01-16 VITALS — BP 147/81 | HR 85 | Ht 74.0 in | Wt 223.0 lb

## 2022-01-16 DIAGNOSIS — Z79899 Other long term (current) drug therapy: Secondary | ICD-10-CM | POA: Diagnosis not present

## 2022-01-16 DIAGNOSIS — Z0289 Encounter for other administrative examinations: Secondary | ICD-10-CM

## 2022-01-16 DIAGNOSIS — I10 Essential (primary) hypertension: Secondary | ICD-10-CM

## 2022-01-16 DIAGNOSIS — L0201 Cutaneous abscess of face: Secondary | ICD-10-CM

## 2022-01-16 DIAGNOSIS — E1142 Type 2 diabetes mellitus with diabetic polyneuropathy: Secondary | ICD-10-CM

## 2022-01-16 DIAGNOSIS — Z89512 Acquired absence of left leg below knee: Secondary | ICD-10-CM

## 2022-01-16 DIAGNOSIS — Z794 Long term (current) use of insulin: Secondary | ICD-10-CM

## 2022-01-16 DIAGNOSIS — G8929 Other chronic pain: Secondary | ICD-10-CM | POA: Diagnosis not present

## 2022-01-16 DIAGNOSIS — M79673 Pain in unspecified foot: Secondary | ICD-10-CM | POA: Diagnosis not present

## 2022-01-16 DIAGNOSIS — E1122 Type 2 diabetes mellitus with diabetic chronic kidney disease: Secondary | ICD-10-CM | POA: Diagnosis not present

## 2022-01-16 DIAGNOSIS — M5136 Other intervertebral disc degeneration, lumbar region: Secondary | ICD-10-CM

## 2022-01-16 LAB — BAYER DCA HB A1C WAIVED: HB A1C (BAYER DCA - WAIVED): 9.8 % — ABNORMAL HIGH (ref 4.8–5.6)

## 2022-01-16 MED ORDER — HYDROCODONE-ACETAMINOPHEN 10-325 MG PO TABS: 1.0000 | ORAL_TABLET | Freq: Two times a day (BID) | ORAL | 0 refills | Status: DC | PRN

## 2022-01-16 MED ORDER — SULFAMETHOXAZOLE-TRIMETHOPRIM 800-160 MG PO TABS: 1.0000 | ORAL_TABLET | Freq: Two times a day (BID) | ORAL | 0 refills | Status: DC

## 2022-01-16 MED ORDER — SEMAGLUTIDE (1 MG/DOSE) 4 MG/3ML ~~LOC~~ SOPN
1.0000 mg | PEN_INJECTOR | SUBCUTANEOUS | 3 refills | Status: DC
Start: 2022-01-16 — End: 2022-11-21

## 2022-01-16 NOTE — Telephone Encounter (Signed)
[name], ? ?Dr. Louanne Skye asked to send a note to you about pt. He is currently on Ozempic and uses [name] as well. Pt needs financial assistance. Looks like pt is scheduled to see you on 02/01/22. Let me know if I need to do anything. ?

## 2022-01-16 NOTE — Addendum Note (Signed)
Addended by: Dorene Sorrow on: 01/16/2022 10:43 AM ? ? Modules accepted: Orders ? ?

## 2022-01-16 NOTE — Progress Notes (Signed)
BP (!) 147/81    Pulse 85    Ht 6\' 2"  (1.88 m)    Wt 223 lb (101.2 kg)    SpO2 97%    BMI 28.63 kg/m    Subjective:   Patient ID: [name], male    DOB: 1966-01-07, 56 y.o.   MRN: [id number]  HPI: [name] is a 56 y.o. male presenting on 01/16/2022 for Medical Management of Chronic Issues, Diabetes, and Hypertension   HPI Type 2 diabetes mellitus Patient comes in today for recheck of his diabetes. Patient has been currently taking 01/18/2022 and Ozempic and metformin, he admits that he has been skipping some of his Ozempic because of affordability problems and supply chain problems through the pharmacies.  His A1c today is 9.8.  He also says his diet has not been as good either.. Patient is not currently on an ACE inhibitor/ARB. Patient has not seen an ophthalmologist this year. Patient denies any issues with their feet. The symptom started onset as an adult hypertension and neuropathy ARE RELATED TO DM   Hypertension Patient is currently on metoprolol, and their blood pressure today is 147/81. Patient denies any lightheadedness or dizziness. Patient denies headaches, blurred vision, chest pains, shortness of breath, or weakness. Denies any side effects from medication and is content with current medication.   Pain assessment: Cause of pain-degenerative disc disease and pain with his amputations on his legs and neuropathy Pain location-back and legs Pain on scale of 1-10- 5 Frequency-Daily What increases pain-walking and sitting and weather changes What makes pain Better-resting and hydrocodone and gabapentin Effects on ADL -some limitations because of amputations and prosthetics but does pretty well Any change in general medical condition-none except diabetes worsened  Current opioids rx-hydrocodone-acetaminophen 10-3 25 twice daily as needed # meds rx-60 Effectiveness of current meds-works well Adverse reactions from pain meds-none Morphine equivalent-20  Pill count  performed-No Last drug screen -03/09/2021 ( high risk q53m, moderate risk q28m, low risk yearly ) Urine drug screen today- Yes Was the NCCSR reviewed-yes  If yes were their any concerning findings? -None  No flowsheet data found.   Pain contract signed on: 01/16/22  Patient is started with an abscess on the right side of his face over the past week.  He was treated a few weeks for the same thing.  He says it just started he usually put some topical antibiotic stuff on it and peroxide but he has a few spots that are getting larger  Relevant past medical, surgical, family and social history reviewed and updated as indicated. Interim medical history since our last visit reviewed. Allergies and medications reviewed and updated.  Review of Systems  Constitutional:  Negative for chills and fever.  Respiratory:  Negative for shortness of breath and wheezing.   Cardiovascular:  Negative for chest pain and leg swelling.  Musculoskeletal:  Negative for back pain and gait problem.  Skin:  Positive for color change and rash. Negative for wound.  All other systems reviewed and are negative.  Per HPI unless specifically indicated above   Allergies as of 01/16/2022   No Known Allergies      Medication List        Accurate as of January 16, 2022  9:21 AM. If you have any questions, ask your nurse or doctor.          STOP taking these medications    Ozempic (0.25 or 0.5 MG/DOSE) 2 MG/1.5ML Sopn Generic drug: Semaglutide(0.25 or 0.5MG /DOS)  Replaced by: Semaglutide (1 MG/DOSE) 4 MG/3ML Sopn Stopped by: Elige Radon Nykeria Mealing, MD       TAKE these medications    apixaban 5 MG Tabs tablet Commonly known as: ELIQUIS Take 1 tablet (5 mg total) by mouth 2 (two) times daily.   atorvastatin 20 MG tablet Commonly known as: LIPITOR Take 1 tablet (20 mg total) by mouth daily.   DULoxetine 60 MG capsule Commonly known as: Cymbalta Take 1 capsule (60 mg total) by mouth daily.   fluticasone 50  MCG/ACT nasal spray Commonly known as: FLONASE Place 1 spray into both nostrils 2 (two) times daily as needed for allergies or rhinitis.   FreeStyle [place] 2 Reader [place] Use as directed to test blood sugar up to 6 times daily as directed. DX: E 11.9   FreeStyle Libre 2 Sensor Misc Use as directed to test blood sugar up to 6 times daily as directed. DX: E 11.9   gabapentin 600 MG tablet Commonly known as: NEURONTIN TAKE 1 TABLET BY MOUTH THREE TIMES A DAY   HYDROcodone-acetaminophen 10-325 MG tablet Commonly known as: NORCO Take 1 tablet by mouth 2 (two) times daily as needed. What changed: Another medication with the same name was changed. Make sure you understand how and when to take each. Changed by: Nils Pyle, MD   HYDROcodone-acetaminophen 10-325 MG tablet Commonly known as: NORCO Take 1 tablet by mouth 2 (two) times daily as needed. Start taking on: February 15, 2022 What changed: These instructions start on February 15, 2022. If you are unsure what to do until then, ask your doctor or other care provider. Changed by: Nils Pyle, MD   HYDROcodone-acetaminophen 10-325 MG tablet Commonly known as: NORCO Take 1 tablet by mouth 2 (two) times daily as needed. Start taking on: Mar 17, 2022 What changed: These instructions start on Mar 17, 2022. If you are unsure what to do until then, ask your doctor or other care provider. Changed by: Elige Radon Shaquinta Peruski, MD   metFORMIN 1000 MG tablet Commonly known as: GLUCOPHAGE Take 1 tablet (1,000 mg total) by mouth 2 (two) times daily.   metoprolol succinate 25 MG 24 hr tablet Commonly known as: TOPROL-XL TAKE 1 TABLET BY MOUTH EVERY DAY   multivitamin with minerals tablet Take 1 tablet by mouth daily.   Semaglutide (1 MG/DOSE) 4 MG/3ML Sopn Inject 1 mg as directed once a week. Replaces: Ozempic (0.25 or 0.5 MG/DOSE) 2 MG/1.5ML Sopn Started by: Elige Radon Saphronia Ozdemir, MD   sulfamethoxazole-trimethoprim 800-160 MG  tablet Commonly known as: BACTRIM DS Take 1 tablet by mouth 2 (two) times daily.   Evaristo Bury FlexTouch 100 UNIT/ML FlexTouch Pen Generic drug: insulin degludec Inject 50-70 Units into the skin daily.         Objective:   BP (!) 147/81    Pulse 85    Ht  (1.88 m)    Wt 223 lb (101.2 kg)    SpO2 97%    BMI 28.63 kg/m   Wt Readings from Last 3 Encounters:  01/16/22 223 lb (101.2 kg)  10/12/21 215 lb (97.5 kg)  08/02/21 217 lb (98.4 kg)    Physical Exam Vitals and nursing note reviewed.  Constitutional:      General: He is not in acute distress.    Appearance: He is well-developed. He is not diaphoretic.  Eyes:     General: No scleral icterus.    Conjunctiva/sclera: Conjunctivae normal.  Neck:     Thyroid: No thyromegaly.  Cardiovascular:     Rate and Rhythm: Normal rate and regular rhythm.     Heart sounds: Normal heart sounds. No murmur heard. Pulmonary:     Effort: Pulmonary effort is normal. No respiratory distress.     Breath sounds: Normal breath sounds. No wheezing.  Musculoskeletal:        General: No swelling. Normal range of motion.     Cervical back: Neck supple.     Comments: Bilateral prosthetic device for BKA's  Lymphadenopathy:     Cervical: No cervical adenopathy.  Skin:    General: Skin is warm and dry.     Findings: Lesion (Right jawline abscess, red, small amount of induration but no fluctuation.  Slightly tender to palpation) present. No rash.  Neurological:     Mental Status: He is alert and oriented to person, place, and time.     Coordination: Coordination normal.  Psychiatric:        Behavior: Behavior normal.      Assessment & Plan:   Problem List Items Addressed This Visit       Cardiovascular and Mediastinum   Essential hypertension, benign     Endocrine   Neuropathy in diabetes (HCC)   Relevant Medications   Semaglutide, 1 MG/DOSE, 4 MG/3ML SOPN   Type 2 diabetes mellitus (HCC) - Primary   Relevant Medications    Semaglutide, 1 MG/DOSE, 4 MG/3ML SOPN   Other Relevant Orders   Bayer DCA Hb A1c Waived   Microalbumin / creatinine urine ratio     Musculoskeletal and Integument   DDD (degenerative disc disease), lumbar   Relevant Medications   HYDROcodone-acetaminophen (NORCO) 10-325 MG tablet   HYDROcodone-acetaminophen (NORCO) 10-325 MG tablet (Start on 03/17/2022)   HYDROcodone-acetaminophen (NORCO) 10-325 MG tablet (Start on 02/15/2022)     Other   Pain management contract signed   Relevant Medications   HYDROcodone-acetaminophen (NORCO) 10-325 MG tablet   HYDROcodone-acetaminophen (NORCO) 10-325 MG tablet (Start on 03/17/2022)   HYDROcodone-acetaminophen (NORCO) 10-325 MG tablet (Start on 02/15/2022)   S/P BKA (below knee amputation) unilateral, left (HCC)   Relevant Medications   HYDROcodone-acetaminophen (NORCO) 10-325 MG tablet   HYDROcodone-acetaminophen (NORCO) 10-325 MG tablet (Start on 03/17/2022)   HYDROcodone-acetaminophen (NORCO) 10-325 MG tablet (Start on 02/15/2022)   Other Visit Diagnoses     Controlled substance agreement signed       Relevant Orders   ToxASSURE Select 13 (MW), Urine   Chronic foot pain, unspecified laterality       Relevant Medications   HYDROcodone-acetaminophen (NORCO) 10-325 MG tablet   HYDROcodone-acetaminophen (NORCO) 10-325 MG tablet (Start on 03/17/2022)   HYDROcodone-acetaminophen (NORCO) 10-325 MG tablet (Start on 02/15/2022)   Abscess of face       Relevant Medications   sulfamethoxazole-trimethoprim (BACTRIM DS) 800-160 MG tablet       Patient currently uses a freestyle libre. This office visit was a visit to discuss patient's diabetic management and he is under much better control with the freestyle libre and using insulin 4 times daily and having to check his blood sugars 4 times daily I believe he is a good candidate f to continue having a continuous subcutaneous glucose monitor such as freestyle libre.   We will try to increase for Ozempic and  sent message to pharmacist to see about help with financial needs.  He did not have his libre machine because his phone got lost and he still working on getting on his new phone so he will bring that back  when he comes back Follow up plan: Return if symptoms worsen or fail to improve, for 2-week with pharmacist in 3 months with me for diabetes.  Counseling provided for all of the vaccine components Orders Placed This Encounter  Procedures   ToxASSURE Select 13 (MW), Urine   Bayer DCA Hb A1c Waived   Microalbumin / creatinine urine ratio    Arville CareJoshua Dorethy Tomey, MD [facility] 01/16/2022, 9:21 AM

## 2022-01-17 LAB — MICROALBUMIN / CREATININE URINE RATIO
Creatinine, Urine: 301.3 mg/dL
Microalb/Creat Ratio: 54 mg/g creat — ABNORMAL HIGH (ref 0–29)
Microalbumin, Urine: 163.9 ug/mL

## 2022-01-17 NOTE — Telephone Encounter (Signed)
Can you check on patient's ozempic patient assistance ?  ?No rush ?

## 2022-01-21 LAB — DRUG SCREEN 10 W/CONF, SERUM
Amphetamines, IA: NEGATIVE ng/mL
Barbiturates, IA: NEGATIVE ug/mL
Benzodiazepines, IA: NEGATIVE ng/mL
Cocaine & Metabolite, IA: NEGATIVE ng/mL
Methadone, IA: NEGATIVE ng/mL
Opiates, IA: NEGATIVE ng/mL
Oxycodones, IA: NEGATIVE ng/mL
Phencyclidine, IA: NEGATIVE ng/mL
Propoxyphene, IA: NEGATIVE ng/mL
THC(Marijuana) Metabolite, IA: NEGATIVE ng/mL

## 2022-02-01 ENCOUNTER — Telehealth: Payer: Medicare Other

## 2022-02-02 ENCOUNTER — Ambulatory Visit: Payer: Medicare Other | Admitting: Cardiology

## 2022-02-03 ENCOUNTER — Encounter: Payer: Self-pay | Admitting: Cardiology

## 2022-02-03 ENCOUNTER — Ambulatory Visit (INDEPENDENT_AMBULATORY_CARE_PROVIDER_SITE_OTHER): Payer: Medicare Other | Admitting: Cardiology

## 2022-02-03 VITALS — BP 118/80 | HR 84 | Ht 74.0 in | Wt 223.0 lb

## 2022-02-03 DIAGNOSIS — I480 Paroxysmal atrial fibrillation: Secondary | ICD-10-CM | POA: Diagnosis not present

## 2022-02-03 DIAGNOSIS — I10 Essential (primary) hypertension: Secondary | ICD-10-CM

## 2022-02-03 DIAGNOSIS — E782 Mixed hyperlipidemia: Secondary | ICD-10-CM | POA: Diagnosis not present

## 2022-02-03 MED ORDER — APIXABAN 5 MG PO TABS: 5.0000 mg | ORAL_TABLET | Freq: Two times a day (BID) | ORAL | 0 refills | Status: DC

## 2022-02-03 NOTE — Progress Notes (Signed)
? ? ?Cardiology Office Note ? ?Date: 02/03/2022  ? ?ID: [name], DOB 02/05/1966, MRN [id number] ? ?PCP:  Dettinger, Elige Radon, MD  ?Cardiologist:  Nona Dell, MD ?Electrophysiologist:  None  ? ?Chief Complaint  ?Patient presents with  ? Cardiac follow-up  ? ? ?History of Present Illness: ?[name] is a 56 y.o. male last seen in September 2022.  He is here for a follow-up visit.  He does not report any palpitations since last encounter.  We went over his medications and he states that he has been taking his Eliquis only once a day, has been getting samples and we did talk about getting paperwork completed for the assistance program.  I reviewed his last lab work, he continues to follow with Dr. Louanne Skye.  Also remains on Toprol-XL. ? ?Past Medical History:  ?Diagnosis Date  ? Charcot ankle   ? Essential hypertension   ? Osteomyelitis of toe of right foot (HCC)   ? Paroxysmal atrial fibrillation (HCC)   ? Recurrent boils   ? Type 2 diabetes mellitus with diabetic neuropathy (HCC)   ? Diagnosed age 55  ? ? ?Past Surgical History:  ?Procedure Laterality Date  ? AMPUTATION Right 06/17/2014  ? Procedure: PARTIAL AMPUTATION RIGHT 3RD TOE;  Surgeon: [name], DPM;  Location: [facility];  Service: Podiatry;  Laterality: Right;  ? AMPUTATION Right 09/02/2014  ? Procedure: PARTIAL AMPUTATION 2ND TOE RIGHT FOOT;  Surgeon: [name], DPM;  Location: [facility];  Service: Podiatry;  Laterality: Right;  ? AMPUTATION Right 04/01/2015  ? Procedure: AMPUTATION DIGIT 1ST TOE RIGHT FOOT;  Surgeon: Laurell Josephs, DPM;  Location: [facility];  Service: Podiatry;  Laterality: Right;  ? AMPUTATION Right 06/29/2016  ? pinkeye   ? AMPUTATION Right 10/24/2019  ? Procedure: PARTIAL THIRD RAY AMPUTATION;  Surgeon: Ferman Hamming, DPM;  Location: [facility];  Service: Podiatry;  Laterality: Right;  ? AMPUTATION Right 02/19/2020  ? Procedure: AMPUTATION BELOW KNEE; RIGHT;  Surgeon: [name], MD;  Location: [facility];   Service: General;  Laterality: Right;  ? FOOT AMPUTATION Left   ? FOOT SURGERY Left 03/2017  ? HERNIA REPAIR Bilateral   ? and 3rd hernia repair does not remember ehat side  ? IRRIGATION AND DEBRIDEMENT ABSCESS Right 11/05/2019  ? Procedure: IRRIGATION AND DRAINAGE ABSCESS RIGHT ANKLE;  Surgeon: Ferman Hamming, DPM;  Location: [facility];  Service: Podiatry;  Laterality: Right;  ? TEE WITHOUT CARDIOVERSION N/A 10/29/2019  ? Procedure: TRANSESOPHAGEAL ECHOCARDIOGRAM (TEE) WITH PROPOFOL;  Surgeon: Pricilla Riffle, MD;  [facility]: [facility];  Service: Cardiovascular;  Laterality: N/A;  ? ? ?Current Outpatient Medications  ?Medication Sig Dispense Refill  ? apixaban (ELIQUIS) 5 MG TABS tablet Take 1 tablet (5 mg total) by mouth 2 (two) times daily. 56 tablet 0  ? atorvastatin (LIPITOR) 20 MG tablet Take 1 tablet (20 mg total) by mouth daily. 90 tablet 3  ? Continuous Blood Gluc Receiver (FREESTYLE LIBRE 2 READER) DEVI Use as directed to test blood sugar up to 6 times daily as directed. DX: E 11.9 1 each 0  ? Continuous Blood Gluc Sensor (FREESTYLE LIBRE 2 SENSOR) MISC Use as directed to test blood sugar up to 6 times daily as directed. DX: E 11.9 6 each 11  ? fluticasone (FLONASE) 50 MCG/ACT nasal spray Place 1 spray into both nostrils 2 (two) times daily as needed for allergies or rhinitis. 16 g 6  ? gabapentin (NEURONTIN) 600 MG tablet TAKE 1  TABLET BY MOUTH THREE TIMES A DAY 90 tablet 2  ? HYDROcodone-acetaminophen (NORCO) 10-325 MG tablet Take 1 tablet by mouth 2 (two) times daily as needed. 60 tablet 0  ? [START ON 03/17/2022] HYDROcodone-acetaminophen (NORCO) 10-325 MG tablet Take 1 tablet by mouth 2 (two) times daily as needed. 60 tablet 0  ? [START ON 02/15/2022] HYDROcodone-acetaminophen (NORCO) 10-325 MG tablet Take 1 tablet by mouth 2 (two) times daily as needed. 60 tablet 0  ? insulin degludec (TRESIBA FLEXTOUCH) 100 UNIT/ML FlexTouch Pen Inject 50-70 Units into the skin daily.    ? metFORMIN (GLUCOPHAGE)  1000 MG tablet Take 1 tablet (1,000 mg total) by mouth 2 (two) times daily. 180 tablet 3  ? metoprolol succinate (TOPROL-XL) 25 MG 24 hr tablet TAKE 1 TABLET BY MOUTH EVERY DAY 90 tablet 3  ? Multiple Vitamins-Minerals (MULTIVITAMIN WITH MINERALS) tablet Take 1 tablet by mouth daily.    ? Semaglutide, 1 MG/DOSE, 4 MG/3ML SOPN Inject 1 mg as directed once a week. 3 mL 3  ? sulfamethoxazole-trimethoprim (BACTRIM DS) 800-160 MG tablet Take 1 tablet by mouth 2 (two) times daily. 20 tablet 0  ? DULoxetine (CYMBALTA) 60 MG capsule Take 1 capsule (60 mg total) by mouth daily. (Patient not taking: Reported on 02/03/2022) 90 capsule 3  ? ?No current facility-administered medications for this visit.  ? ?Allergies:  Patient has no known allergies.  ? ?ROS: No chest pain, no dizziness or syncope. ? ?Physical Exam: ?VS:  BP 118/80   Pulse 84   Ht 6\' 2"  (1.88 m)   Wt 223 lb (101.2 kg)   SpO2 96%   BMI 28.63 kg/m? , BMI Body mass index is 28.63 kg/m?. ? ?Wt Readings from Last 3 Encounters:  ?02/03/22 223 lb (101.2 kg)  ?01/16/22 223 lb (101.2 kg)  ?10/12/21 215 lb (97.5 kg)  ?  ?General: Patient appears comfortable at rest. ?HEENT: Conjunctiva and lids normal. ?Neck: Supple, no elevated JVP or carotid bruits, no thyromegaly. ?Lungs: Clear to auscultation, nonlabored breathing at rest. ?Cardiac: Regular rate and rhythm, no S3 or significant systolic murmur, no pericardial rub. ?Extremities: Status post right BKA. ? ?ECG:  An ECG dated 07/25/2021 was personally reviewed today and demonstrated:  Sinus rhythm. ? ?Recent Labwork: ?07/14/2021: Hemoglobin 15.7; Platelets 249 ?10/12/2021: ALT 19; AST 14 ?10/25/2021: BUN 14; Creatinine, Ser 0.96; Potassium 4.8; Sodium 145  ?   ?Component Value Date/Time  ? CHOL 91 (L) 07/14/2021 1042  ? TRIG 146 07/14/2021 1042  ? HDL 29 (L) 07/14/2021 1042  ? CHOLHDL 3.1 07/14/2021 1042  ? CHOLHDL 4.6 01/30/2018 1347  ? VLDL 54 (H) 01/30/2018 1347  ? LDLCALC 37 07/14/2021 1042  ?March 2023: Hemoglobin A1c  9.8% ? ?Other Studies Reviewed Today: ? ?TEE 10/29/2019: ? 1. Left ventricular ejection fraction, by visual estimation, is 60 to  ?65%. The left ventricle has normal function. There is no left ventricular  ?hypertrophy.  ? 2. The left ventricle has no regional wall motion abnormalities.  ? 3. Global right ventricle has normal systolic function.The right  ?ventricular size is normal. Right vetricular wall thickness was not  ?assessed.  ? 4. Left atrial size was normal.  ? 5. Right atrial size was normal.  ? 6. The mitral valve is normal in structure. No evidence of mitral valve  ?regurgitation.  ? 7. The tricuspid valve is normal in structure.  ? 8. The aortic valve is normal in structure. Aortic valve regurgitation is  ?not visualized.  ? 9. The  pulmonic valve was normal in structure. Pulmonic valve  ?regurgitation is trivial.  ? ?Assessment and Plan: ? ?1.  History of atrial fibrillation with CHA2DS2-VASc score of 5.  He has had no recurrent episodes on beta-blocker and continues on Eliquis as well for stroke prophylaxis.  Continue to follow lab work with PCP, he does not report any interval bleeding problems.  We discussed beginning paperwork completed for the assistance program.  Samples provided of Eliquis. ? ?2.  Essential hypertension, blood pressure is well controlled today. ? ?3.  Mixed hyperlipidemia, doing well on Lipitor with recent LDL 37. ? ?Medication Adjustments/Labs and Tests Ordered: ?Current medicines are reviewed at length with the patient today.  Concerns regarding medicines are outlined above.  ? ?Tests Ordered: ?No orders of the defined types were placed in this encounter. ? ? ?Medication Changes: ?No orders of the defined types were placed in this encounter. ? ? ?Disposition:  Follow up  6 months. ? ?Signed, ?Jonelle Sidle, MD, [facility] ?02/03/2022 2:30 PM    ?[facility] ?[address] [place], [place], [place] [address] ?Phone: [phone]; Fax: [phone]  ?

## 2022-02-03 NOTE — Patient Instructions (Addendum)
Medication Instructions:  ?Continue all current medications. ? ?Labwork: ?none ? ?Testing/Procedures: ?none ? ?Follow-Up: ?6 months  ? ?Any Other Special Instructions Will Be Listed Below (If Applicable). ?Eliquis samples & application for patient assistance given today.  ? ?If you need a refill on your cardiac medications before your next appointment, please call your pharmacy. ? ?

## 2022-02-03 NOTE — Addendum Note (Signed)
Addended by: Lesle Chris on: 02/03/2022 04:30 PM ? ? Modules accepted: Orders ? ?

## 2022-02-09 DIAGNOSIS — Z794 Long term (current) use of insulin: Secondary | ICD-10-CM | POA: Diagnosis not present

## 2022-02-09 DIAGNOSIS — E1165 Type 2 diabetes mellitus with hyperglycemia: Secondary | ICD-10-CM | POA: Diagnosis not present

## 2022-02-13 ENCOUNTER — Other Ambulatory Visit: Payer: Self-pay | Admitting: Family Medicine

## 2022-02-13 DIAGNOSIS — E1142 Type 2 diabetes mellitus with diabetic polyneuropathy: Secondary | ICD-10-CM

## 2022-02-14 ENCOUNTER — Ambulatory Visit (INDEPENDENT_AMBULATORY_CARE_PROVIDER_SITE_OTHER): Payer: Medicare Other | Admitting: Pharmacist

## 2022-02-14 DIAGNOSIS — I10 Essential (primary) hypertension: Secondary | ICD-10-CM

## 2022-02-14 DIAGNOSIS — E1122 Type 2 diabetes mellitus with diabetic chronic kidney disease: Secondary | ICD-10-CM

## 2022-02-14 NOTE — Telephone Encounter (Signed)
Called patient  ?Need insurance card & proof of income for patient assistance ?

## 2022-02-21 NOTE — Patient Instructions (Signed)
Visit Information ? ?Following are the goals we discussed today:  ?Current Barriers:  ?Unable to independently afford treatment regimen ?Unable to maintain control of T2DM ? ?Pharmacist Clinical Goal(s):  ?Over the next 90 days, patient will verbalize ability to afford treatment regimen ?maintain control of T2DM as evidenced by IMPROVED GLYCEMIC CONTROL  through collaboration with PharmD and provider.  ? ?Interventions: ?1:1 collaboration with Dettinger, [name], MD regarding development and update of comprehensive plan of care as evidenced by provider attestation and co-signature ?Inter-disciplinary care team collaboration (see longitudinal plan of care) ?Comprehensive medication review performed; medication list updated in electronic medical record ? ?Diabetes: ?Uncontrolled (A1C 7.7%-->9.5%--INCREASED) ?Current treatment: TRESIBA 68-72 UNITS DAILY, OZEMPIC 1MG  SQ WEEKLY;  ?Received notification from [facility] regarding 2023 RE-ENROLLMENT--they now state they do not have his enrollment--will have to resend a ?Last SHIPMENT patient got in JAN 2023 ?Phone: [phone] ?MEDICATION TO SHIP TO PCP OFFICE PER PROGRAM RULES ?CONTINUE OZEMPIC TO 1MG  SQ WEEKLY AND TRESIBA 68-72 UNITS DAILY ?WILL HAVE TO AWAIT NEW SUPPLY FROM PATIENT ASSISTANCE-->considering increasing ozempic dose to 2mg  weekly, patient states Bgs are better but a1c not at goal; did not bring in [place] meter ?Denies personal and family history of Medullary thyroid cancer (MTC) ?USES LIBRE CGM--CALLED IN LIBRE 2 cgm SYSTEM TO [facility] (ESCRIBED) ?Current glucose readings: fasting glucose: <150, post prandial glucose: <200 ?Denies hypoglycemic/hyperglycemic symptoms ?Discussed meal planning options and Plate method for healthy eating--ADMITS TO EATING INCREASED CARBOHYDRATES ?Avoid sugary drinks and desserts ?Incorporate balanced protein, non starchy veggies, 1 serving of carbohydrate with each meal ?Increase water intake ?Increase physical  activity as able ?Current exercise: N/A ?Recommended POTENTIAL INCREASE IN GLP1 (OZEMPIC) ?Assessed patient finances. PATIENT RE-ENROLLED FOR [facility]OVO [place] 2023  (Secretary) ? ?Hypertension: ?-DISCUSSED BLOOD PRESSURE-- ?Would benefit from ACEi/ARB for diabetes, will address with PCP at patient's next visit.  ?ON METOPROLOL FOR AFIB PER CARDS ?He was previously on lisinopril per chart notes: "BP was soft last month, therefore his PCP discontinued Lisinopril.  ?He was discharged on Cardizem CD but refills were not provided, therefore he discontinued this several weeks ago.  ?BP is at goal <130/80 ? ?Patient Goals/Self-Care Activities ?Over the next 90 days, patient will:  ?- take medications as prescribed ?check glucose USING LIBRE CGM, document, and provide at future appointments ?collaborate with provider on medication access solutions ? ?Follow Up Plan: Telephone follow up appointment with care management team member scheduled for: 1 MONTH ? ? ?Plan: Face to Face appointment with care management team member scheduled for: 1 month ? ?Signature ?Regina Eck, PharmD, BCPS ?Clinical Pharmacist, [facility] ?[place]  [place] Phone [phone] ? ? ?Please call the care guide team at [phone] if you need to cancel or reschedule your appointment.  ? ?The patient verbalized understanding of instructions, educational materials, and care plan provided today and declined offer to receive copy of patient instructions, educational materials, and care plan.  ? ?

## 2022-02-21 NOTE — Progress Notes (Signed)
? ? ?Chronic Care Management ?Pharmacy Note ? ?02/14/2022 ?Name:  [name] MRN:  [id number] DOB:  [birth year] ? ?Summary: ?Diabetes: ?Uncontrolled (A1C 7.7%-->9.5%--INCREASED) ?Current treatment: TRESIBA 68-72 UNITS DAILY, OZEMPIC 1MG SQ WEEKLY;  ?Received notification from [facility] regarding 2023 RE-ENROLLMENT--they now state they do not have his enrollment--will have to resend a ?Last SHIPMENT patient got in JAN 2023 ?Phone: [phone] ?MEDICATION TO SHIP TO PCP OFFICE PER PROGRAM RULES ?CONTINUE OZEMPIC TO 1MG SQ WEEKLY AND TRESIBA 68-72 UNITS DAILY ?WILL HAVE TO AWAIT NEW SUPPLY FROM PATIENT ASSISTANCE-->considering increasing ozempic dose to 69m weekly, patient states Bgs are better but a1c not at goal; [place] ?Denies personal and family history of Medullary thyroid cancer (MTC) ?USES LIBRE CGM--CALLED IN LIBRE 2 cgm SYSTEM TO [facility] (ESCRIBED) ?Current glucose readings: fasting glucose: <150, post prandial glucose: <200 ?Denies hypoglycemic/hyperglycemic symptoms ?Discussed meal planning options and Plate method for healthy eating--ADMITS TO EATING INCREASED CARBOHYDRATES ?Avoid sugary drinks and desserts ?Incorporate balanced protein, non starchy veggies, 1 serving of carbohydrate with each meal ?Increase water intake ?Increase physical activity as able ?Current exercise: N/A ?Recommended POTENTIAL INCREASE IN GLP1 (OZEMPIC) ?Assessed patient finances. [place]  ([place] ? ?Hypertension: ?-DISCUSSED BLOOD PRESSURE-- ?Would benefit from ACEi/ARB for diabetes, will address with PCP at patient's next visit.  ?ON METOPROLOL FOR AFIB PER CARDS ?He was previously on lisinopril per chart notes: "BP was soft last month, therefore his PCP discontinued Lisinopril.  ?He was discharged on Cardizem CD but refills were not provided, therefore he discontinued this several weeks ago.  ?BP is at goal <130/80 ? ?Subjective: ?[name] is an 56y.o. year old male who is a primary patient of Dettinger, JFransisca Kaufmann MD.  The CCM team was consulted for assistance with disease management and care coordination needs.   ? ?Engaged with patient by telephone for follow up visit in response to provider referral for pharmacy case management and/or care coordination services.  ? ?Consent to Services:  ?The patient was given information about Chronic Care Management services, agreed to services, and gave verbal consent prior to initiation of services.  Please see initial visit note for detailed documentation.  ? ?Patient Care Team: ?Dettinger, JFransisca Kaufmann MD as PCP - General (Family Medicine) ?MSatira Sark MD as PCP - Cardiology (Cardiology) ?[facility]avera [facility](Pharmacist) ?PIrene Shipper MD as Consulting Physician (Gastroenterology) ?LRalene Muskratas Physician Assistant (Chiropractic Medicine) ?JCelestia Khat OD (Optometry) ? ? ?Objective: ? ?Lab Results  ?Component Value Date  ? CREATININE 0.96 10/25/2021  ? CREATININE 1.04 10/12/2021  ? CREATININE 1.11 07/14/2021  ? ? ?Lab Results  ?Component Value Date  ? HGBA1C 9.8 (H) 01/16/2022  ? ?Last diabetic Eye exam:  ?Lab Results  ?Component Value Date/Time  ? HMDIABEYEEXA No Retinopathy 08/18/2020 12:00 AM  ?  ?Last diabetic Foot exam: No results found for: HMDIABFOOTEX  ? ?   ?Component Value Date/Time  ? CHOL 91 (L) 07/14/2021 1042  ? TRIG 146 07/14/2021 1042  ? HDL 29 (L) 07/14/2021 1042  ? CHOLHDL 3.1 07/14/2021 1042  ? CHOLHDL 4.6 01/30/2018 1347  ? VLDL 54 (H) 01/30/2018 1347  ? [place]37 07/14/2021 1042  ? ? ? ?  Latest Ref Rng & Units 10/12/2021  ?  3:41 PM 07/14/2021  ? 10:42 AM 12/03/2020  ? 11:18 AM  ?Hepatic Function  ?Total Protein 6.0 - 8.5 g/dL 6.6   6.5  6.8    ?Albumin 3.8 - 4.9 g/dL 4.2   4.2   4.2    ?AST 0 - 40 IU/L 14   46   20    ?ALT 0 - 44 IU/L 19   61   37    ?Alk Phosphatase 44 - 121 IU/L 143   138   142    ?Total Bilirubin 0.0 - 1.2 mg/dL 0.6   0.7   0.5    ? ? ?Lab Results   ?Component Value Date/Time  ? TSH 0.961 12/03/2020 11:18 AM  ? TSH 0.929 10/23/2019 03:59 AM  ? TSH 1.490 03/09/2014 11:09 AM  ? ? ? ?  Latest Ref Rng & Units 07/14/2021  ? 10:42 AM 12/03/2020  ? 11:18 AM 04/23/2020  ? 12:31 PM  ?CBC  ?WBC 3.4 - 10.8 x10E3/uL 6.8   7.0   4.8    ?Hemoglobin 13.0 - 17.7 g/dL 15.7   16.3   14.5    ?Hematocrit 37.5 - 51.0 % 46.0   48.7   42.8    ?Platelets 150 - 450 x10E3/uL 249   219   225    ? ? ?No results found for: VD25OH ? ?Clinical ASCVD: No  ?The ASCVD Risk score (Arnett DK, et al., 2019) failed to calculate for the following reasons: ?  The valid total cholesterol range is 130 to 320 mg/dL   ? ?Other: (CHADS2VASc if Afib, PHQ9 if depression, MMRC or CAT for COPD, ACT, DEXA) ? ?Social History  ? ?Tobacco Use  ?Smoking Status Never  ?Smokeless Tobacco Never  ? ?BP Readings from Last 3 Encounters:  ?02/03/22 118/80  ?01/16/22 (!) 147/81  ?11/17/21 115/80  ? ?Pulse Readings from Last 3 Encounters:  ?02/03/22 84  ?01/16/22 85  ?10/12/21 79  ? ?Wt Readings from Last 3 Encounters:  ?02/03/22 223 lb (101.2 kg)  ?01/16/22 223 lb (101.2 kg)  ?10/12/21 215 lb (97.5 kg)  ? ? ?Assessment: Review of patient past medical history, allergies, medications, health status, including review of consultants reports, laboratory and other test data, was performed as part of comprehensive evaluation and provision of chronic care management services.  ? ?SDOH:  (Social Determinants of Health) assessments and interventions performed:  ? ? ?CCM Care Plan ? ?No Known Allergies ? ?Medications Reviewed Today   ? ? Reviewed by Lavera Guise, [facility] (Pharmacist) on 02/14/22 at 34  Med List Status: <None>  ? ?Medication Order Taking? Sig Documenting Provider Last Dose Status Informant  ?apixaban (ELIQUIS) 5 MG TABS tablet [id number]  Take 1 tablet (5 mg total) by mouth 2 (two) times daily. Satira Sark, MD  Active   ?atorvastatin (LIPITOR) 20 MG tablet [id number] No Take 1 tablet (20 mg total) by mouth daily.  Dettinger, [name], MD Taking Active   ?Continuous Blood Gluc Receiver (FREESTYLE LIBRE 2 READER) DEVI [id number] No Use as directed to test blood sugar up to 6 times daily as directed. DX: E 11.9 Dettinger, [name], MD Taking Active   ?         ?Med Note Parthenia Ames Nov 17, 2021 10:56 AM) VIA [facility] [place] [facility] ?  ?Continuous Blood Gluc Sensor (FREESTYLE LIBRE 2 SENSOR) MISC [id number] No Use as directed to test blood sugar up to 6 times daily as directed. DX: E 11.9 Dettinger, [name], MD Taking Active   ?         ?Med Note (Khamari Yousuf D  Thu Nov 17, 2021 10:56 AM) [facility]IA [facility] [place] [facility]  ?DULoxetine (CYMBALTA) 60 MG capsule [id number] No Take 1 capsule (60 mg total) by mouth daily.  ?Patient not taking: Reported on 02/03/2022  ? Dettinger, [name], MD Not Taking Active   ?fluticasone (FLONASE) 50 MCG/ACT nasal spray [id number] No Place 1 spray into both nostrils 2 (two) times daily as needed for allergies or rhinitis. Dettinger, [name], MD Taking Active Self  ?gabapentin (NEURONTIN) 600 MG tablet [id number]  TAKE 1 TABLET BY MOUTH THREE TIMES A DAY Dettinger, [name], MD  Active   ?HYDROcodone-acetaminophen (NORCO) 10-325 MG tablet [id number] No Take 1 tablet by mouth 2 (two) times daily as needed. Dettinger, [name], MD Taking Active   ?HYDROcodone-acetaminophen (NORCO) 10-325 MG tablet [id number] No Take 1 tablet by mouth 2 (two) times daily as needed. Dettinger, [name], MD Taking Active   ?HYDROcodone-acetaminophen (NORCO) 10-325 MG tablet [id number] No Take 1 tablet by mouth 2 (two) times daily as needed. Dettinger, [name], MD Taking Active   ?insulin degludec (TRESIBA FLEXTOUCH) 100 UNIT/ML FlexTouch Pen [id number] No Inject 50-70 Units into the skin daily. [provider] Taking Active   ?         ?Med Note Blanca Friend, Kaimana Neuzil D   Tue Jun 28, 2021 11:55 AM) VIA [facility] PATIENT ASSISTANCE PROGRAM  ?metFORMIN (GLUCOPHAGE) 1000 MG tablet [id number]  No Take 1 tablet (1,000 mg total) by mouth 2 (two) times daily. Dettinger, [name], MD Taking Active   ?metoprolol succinate (TOPROL-XL) 25 MG 24 hr tablet [id number] No TAKE 1 TABLET BY MOUTH EVERY DA

## 2022-02-23 NOTE — Telephone Encounter (Signed)
I resent his application if we can follow this one closely--not sure what [employer] did on this one ?Approved and then not ?So hoping this goes through ?Will route you app ?Thank you so much! ?

## 2022-02-28 ENCOUNTER — Telehealth: Payer: Self-pay

## 2022-02-28 NOTE — Telephone Encounter (Signed)
Spoke with pt regarding proof of income needed for novo nordisk re-enrollment application. Pt should be able to bring in disability statement letter to office tomorrow.  ?

## 2022-03-05 DIAGNOSIS — E1122 Type 2 diabetes mellitus with diabetic chronic kidney disease: Secondary | ICD-10-CM

## 2022-03-05 DIAGNOSIS — I10 Essential (primary) hypertension: Secondary | ICD-10-CM | POA: Diagnosis not present

## 2022-03-05 DIAGNOSIS — Z794 Long term (current) use of insulin: Secondary | ICD-10-CM

## 2022-03-08 ENCOUNTER — Telehealth: Payer: Self-pay | Admitting: Family Medicine

## 2022-03-10 NOTE — Telephone Encounter (Signed)
Returned call ?Patient's income forms left up front  ?Samples in fridge ?

## 2022-03-12 DIAGNOSIS — Z794 Long term (current) use of insulin: Secondary | ICD-10-CM | POA: Diagnosis not present

## 2022-03-12 DIAGNOSIS — E1165 Type 2 diabetes mellitus with hyperglycemia: Secondary | ICD-10-CM | POA: Diagnosis not present

## 2022-03-17 ENCOUNTER — Telehealth: Payer: Self-pay

## 2022-03-17 ENCOUNTER — Other Ambulatory Visit: Payer: Self-pay | Admitting: Cardiology

## 2022-03-17 ENCOUNTER — Other Ambulatory Visit: Payer: Self-pay | Admitting: Family Medicine

## 2022-03-17 NOTE — Telephone Encounter (Signed)
Received notification from [employer] regarding approval for NOVOLOG, TRESIBA, OZEMPIC. Patient assistance approved UNTIL 10/05/22. ? ?MEDICATION WILL SHIP TO OFFICE ? ?Phone: [phone] ? ?

## 2022-03-21 ENCOUNTER — Telehealth: Payer: Self-pay

## 2022-03-21 NOTE — Telephone Encounter (Signed)
Patient assistance came in- novo fine 32G tip, tresiba u200, novolog flexpen & ozempic. ?Patient aware to pick up. ?Placed in lab fridge.  ?

## 2022-03-22 ENCOUNTER — Telehealth: Payer: Self-pay | Admitting: Family Medicine

## 2022-03-22 LAB — HM DIABETES EYE EXAM

## 2022-03-22 NOTE — Telephone Encounter (Signed)
4 boxes of Samples of 5mg  Eliquis left up front for pt. Pt made aware. ?

## 2022-04-10 ENCOUNTER — Ambulatory Visit: Payer: Medicare Other | Admitting: Family Medicine

## 2022-04-12 DIAGNOSIS — E1165 Type 2 diabetes mellitus with hyperglycemia: Secondary | ICD-10-CM | POA: Diagnosis not present

## 2022-04-14 ENCOUNTER — Ambulatory Visit: Payer: Medicare Other | Admitting: Family Medicine

## 2022-04-17 ENCOUNTER — Other Ambulatory Visit: Payer: Self-pay | Admitting: Family Medicine

## 2022-04-20 ENCOUNTER — Ambulatory Visit (INDEPENDENT_AMBULATORY_CARE_PROVIDER_SITE_OTHER): Payer: Medicare Other | Admitting: Pharmacist

## 2022-04-20 DIAGNOSIS — E785 Hyperlipidemia, unspecified: Secondary | ICD-10-CM

## 2022-04-20 DIAGNOSIS — E1122 Type 2 diabetes mellitus with diabetic chronic kidney disease: Secondary | ICD-10-CM

## 2022-04-24 ENCOUNTER — Encounter: Payer: Self-pay | Admitting: Family Medicine

## 2022-04-24 ENCOUNTER — Ambulatory Visit: Payer: Medicare Other | Admitting: Family Medicine

## 2022-04-24 VITALS — BP 121/77 | HR 94 | Temp 97.9°F | Ht 74.0 in | Wt 209.0 lb

## 2022-04-24 DIAGNOSIS — E1122 Type 2 diabetes mellitus with diabetic chronic kidney disease: Secondary | ICD-10-CM | POA: Diagnosis not present

## 2022-04-24 DIAGNOSIS — E1140 Type 2 diabetes mellitus with diabetic neuropathy, unspecified: Secondary | ICD-10-CM | POA: Diagnosis not present

## 2022-04-24 DIAGNOSIS — Z0289 Encounter for other administrative examinations: Secondary | ICD-10-CM

## 2022-04-24 DIAGNOSIS — E1142 Type 2 diabetes mellitus with diabetic polyneuropathy: Secondary | ICD-10-CM

## 2022-04-24 DIAGNOSIS — M5136 Other intervertebral disc degeneration, lumbar region: Secondary | ICD-10-CM

## 2022-04-24 DIAGNOSIS — E785 Hyperlipidemia, unspecified: Secondary | ICD-10-CM

## 2022-04-24 DIAGNOSIS — Z89512 Acquired absence of left leg below knee: Secondary | ICD-10-CM | POA: Diagnosis not present

## 2022-04-24 DIAGNOSIS — Z794 Long term (current) use of insulin: Secondary | ICD-10-CM

## 2022-04-24 DIAGNOSIS — I10 Essential (primary) hypertension: Secondary | ICD-10-CM | POA: Diagnosis not present

## 2022-04-24 LAB — BAYER DCA HB A1C WAIVED: HB A1C (BAYER DCA - WAIVED): 9.7 % — ABNORMAL HIGH (ref 4.8–5.6)

## 2022-04-24 MED ORDER — HYDROCODONE-ACETAMINOPHEN 10-325 MG PO TABS: 1.0000 | ORAL_TABLET | Freq: Two times a day (BID) | ORAL | 0 refills | Status: DC | PRN

## 2022-04-24 MED ORDER — ATORVASTATIN CALCIUM 20 MG PO TABS: 20.0000 mg | ORAL_TABLET | Freq: Every day | ORAL | 3 refills | Status: DC

## 2022-04-24 MED ORDER — METFORMIN HCL 1000 MG PO TABS: 1000.0000 mg | ORAL_TABLET | Freq: Two times a day (BID) | ORAL | 3 refills | Status: DC

## 2022-04-24 MED ORDER — DULOXETINE HCL 60 MG PO CPEP: 60.0000 mg | ORAL_CAPSULE | Freq: Every day | ORAL | 3 refills | Status: DC

## 2022-04-24 MED ORDER — GABAPENTIN 600 MG PO TABS: 600.0000 mg | ORAL_TABLET | Freq: Three times a day (TID) | ORAL | 3 refills | Status: DC

## 2022-04-24 NOTE — Progress Notes (Signed)
BP 121/77   Pulse 94   Temp 97.9 F (36.6 C)   Ht _0  (1.88 m)   Wt 209 lb (94.8 kg)   SpO2 98%   BMI 26.83 kg/m    Subjective:   Patient ID: [name], male    DOB: 09/27/1966, 56 y.o.   MRN: [id number]  HPI: HAIK [name] is a 56 y.o. male presenting on 04/24/2022 for Medical Management of Chronic Issues, Diabetes, and Hypertension   HPI Hypertension Patient is currently on metoprolol, and their blood pressure today is 121/77. Patient denies any lightheadedness or dizziness. Patient denies headaches, blurred vision, chest pains, shortness of breath, or weakness. Denies any side effects from medication and is content with current medication.   Type 2 diabetes mellitus Patient comes in today for recheck of his diabetes. Patient has been currently taking Tresiba 76 units and metformin and Ozempic. Patient is not currently on an ACE inhibitor/ARB. Patient has seen an ophthalmologist this year. Patient has bilateral BKA. The symptom started onset as an adult neuropathy and dyslipidemia ARE RELATED TO DM   Pain assessment: Cause of pain-degenerative disc disease Pain location-lower back and both legs status post amputation Pain on scale of 1-10- 5 Frequency-Daily What increases pain-weather changes and certain movements What makes pain Better-rest and medication Effects on ADL -Limited to some extent because of his amputations but gets around pretty good with prosthetics Any change in general medical condition-none  Current opioids rx-hydrocodone 10-3 25 twice daily as needed # meds rx-60 Effectiveness of current meds-works well Adverse reactions from pain meds-none Morphine equivalent-20  Pill count performed-No Last drug screen -01/20/2022 ( high risk q68m moderate risk q681mlow risk yearly ) Urine drug screen today- No [place]  If yes were their any concerning findings? - none Pain contract signed on: 01/20/2022  Relevant past medical,  surgical, family and social history reviewed and updated as indicated. Interim medical history since our last visit reviewed. Allergies and medications reviewed and updated.  Review of Systems  Constitutional:  Negative for chills and fever.  Eyes:  Negative for visual disturbance.  Respiratory:  Negative for shortness of breath and wheezing.   Cardiovascular:  Negative for chest pain and leg swelling.  Musculoskeletal:  Positive for arthralgias and back pain.  Skin:  Negative for rash.  Neurological:  Positive for numbness. Negative for dizziness and weakness.  All other systems reviewed and are negative.   Per HPI unless specifically indicated above   Allergies as of 04/24/2022   No Known Allergies      Medication List        Accurate as of April 24, 2022 11:55 AM. If you have any questions, ask your nurse or doctor.          STOP taking these medications    sulfamethoxazole-trimethoprim 800-160 MG tablet Commonly known as: BACTRIM DS Stopped by: JoFransisca Kaufmannettinger, MD       TAKE these medications    apixaban 5 MG Tabs tablet Commonly known as: ELIQUIS Take 1 tablet (5 mg total) by mouth 2 (two) times daily.   atorvastatin 20 MG tablet Commonly known as: LIPITOR Take 1 tablet (20 mg total) by mouth daily.   DULoxetine 60 MG capsule Commonly known as: Cymbalta Take 1 capsule (60 mg total) by mouth daily.   fluticasone 50 MCG/ACT nasal spray Commonly known as: FLONASE Place 1 spray into both nostrils 2 (two) times daily as needed for allergies or  rhinitis.   FreeStyle [place] 2 Reader [place] Use as directed to test blood sugar up to 6 times daily as directed. DX: E 11.9   FreeStyle Libre 2 Sensor Misc Use as directed to test blood sugar up to 6 times daily as directed. DX: E 11.9   gabapentin 600 MG tablet Commonly known as: NEURONTIN Take 1 tablet (600 mg total) by mouth 3 (three) times daily.   HYDROcodone-acetaminophen 10-325 MG tablet Commonly known  as: NORCO Take 1 tablet by mouth 2 (two) times daily as needed. What changed: Another medication with the same name was changed. Make sure you understand how and when to take each. Changed by: [name], MD   HYDROcodone-acetaminophen 10-325 MG tablet Commonly known as: NORCO Take 1 tablet by mouth 2 (two) times daily as needed. Start taking on: May 23, 2022 What changed: These instructions start on May 23, 2022. If you are unsure what to do until then, ask your doctor or other care provider. Changed by: [name], MD   HYDROcodone-acetaminophen 10-325 MG tablet Commonly known as: NORCO Take 1 tablet by mouth 2 (two) times daily as needed. Start taking on: June 23, 2022 What changed: These instructions start on June 23, 2022. If you are unsure what to do until then, ask your doctor or other care provider. Changed by: [name] [place] Truett, MD   metFORMIN 1000 MG tablet Commonly known as: GLUCOPHAGE Take 1 tablet (1,000 mg total) by mouth 2 (two) times daily.   metoprolol succinate 25 MG 24 hr tablet Commonly known as: TOPROL-XL TAKE 1 TABLET BY MOUTH EVERY DAY   multivitamin with minerals tablet Take 1 tablet by mouth daily.   Semaglutide (1 MG/DOSE) 4 MG/3ML Sopn Inject 1 mg as directed once a week.   Tyler Aas FlexTouch 100 UNIT/ML FlexTouch Pen Generic drug: insulin degludec Inject 50-70 Units into the skin daily.         Objective:   BP 121/77   Pulse 94   Temp 97.9 F (36.6 C)   Ht _0  (1.88 m)   Wt 209 lb (94.8 kg)   SpO2 98%   BMI 26.83 kg/m   Wt Readings from Last 3 Encounters:  04/24/22 209 lb (94.8 kg)  02/03/22 223 lb (101.2 kg)  01/16/22 223 lb (101.2 kg)    Physical Exam Vitals and nursing note reviewed.  Constitutional:      General: He is not in acute distress.    Appearance: He is well-developed. He is not diaphoretic.  Eyes:     General: No scleral icterus.    Conjunctiva/sclera: Conjunctivae normal.  Neck:      Thyroid: No thyromegaly.  Cardiovascular:     Rate and Rhythm: Normal rate and regular rhythm.     Heart sounds: Normal heart sounds. No murmur heard. Pulmonary:     Effort: Pulmonary effort is normal. No respiratory distress.     Breath sounds: Normal breath sounds. No wheezing.  Musculoskeletal:     Cervical back: Neck supple.  Lymphadenopathy:     Cervical: No cervical adenopathy.  Skin:    General: Skin is warm and dry.     Findings: No rash.  Neurological:     Mental Status: He is alert and oriented to person, place, and time.     Coordination: Coordination normal.  Psychiatric:        Behavior: Behavior normal.       Assessment & Plan:   Problem List Items Addressed This Visit  Endocrine   Neuropathy in diabetes (HCC)   Relevant Medications   gabapentin (NEURONTIN) 600 MG tablet   atorvastatin (LIPITOR) 20 MG tablet   metFORMIN (GLUCOPHAGE) 1000 MG tablet   Type 2 diabetes mellitus (HCC) - Primary   Relevant Medications   DULoxetine (CYMBALTA) 60 MG capsule   atorvastatin (LIPITOR) 20 MG tablet   metFORMIN (GLUCOPHAGE) 1000 MG tablet     Musculoskeletal and Integument   DDD (degenerative disc disease), lumbar   Relevant Medications   HYDROcodone-acetaminophen (NORCO) 10-325 MG tablet   HYDROcodone-acetaminophen (NORCO) 10-325 MG tablet (Start on 05/23/2022)   HYDROcodone-acetaminophen (NORCO) 10-325 MG tablet (Start on 06/23/2022)     Other   Pain management contract signed   Relevant Medications   HYDROcodone-acetaminophen (NORCO) 10-325 MG tablet   HYDROcodone-acetaminophen (NORCO) 10-325 MG tablet (Start on 05/23/2022)   HYDROcodone-acetaminophen (NORCO) 10-325 MG tablet (Start on 06/23/2022)   S/P BKA (below knee amputation) unilateral, left (HCC)   Relevant Medications   HYDROcodone-acetaminophen (NORCO) 10-325 MG tablet   HYDROcodone-acetaminophen (NORCO) 10-325 MG tablet (Start on 05/23/2022)   HYDROcodone-acetaminophen (NORCO) 10-325 MG tablet  (Start on 06/23/2022)   Dyslipidemia   Relevant Medications   atorvastatin (LIPITOR) 20 MG tablet   Other Relevant Orders   CBC with Differential/Platelet   CMP14+EGFR   Lipid panel   Bayer DCA Hb A1c Waived (Completed)    A1c is 9.7 which is now much improved from before, he says he just increased his [place] to 76 units less than a week ago, But he has not seen too much improvement.  He does admit that he was not checking regularly because he lost his phone and did not have a way to read the sensor prior to this week.  He says it has been running high. Follow up plan: Return in about 3 months (around 07/25/2022), or if symptoms worsen or fail to improve, for Diabetes recheck.  Counseling provided for all of the vaccine components Orders Placed This Encounter  Procedures   CBC with Differential/Platelet   CMP14+EGFR   Lipid panel   Bayer DCA Hb A1c Waived    Caryl Pina, MD [facility] 04/24/2022, 11:55 AM

## 2022-04-25 LAB — LIPID PANEL
Chol/HDL Ratio: 2.7 ratio (ref 0.0–5.0)
Cholesterol, Total: 61 mg/dL — ABNORMAL LOW (ref 100–199)
HDL: 23 mg/dL — ABNORMAL LOW (ref >39)
LDL Chol Calc (NIH): 15 mg/dL (ref 0–99)
Triglycerides: 130 mg/dL (ref 0–149)
VLDL Cholesterol Cal: 23 mg/dL (ref 5–40)

## 2022-04-25 LAB — CMP14+EGFR
ALT: 58 IU/L — ABNORMAL HIGH (ref 0–44)
AST: 24 IU/L (ref 0–40)
Albumin/Globulin Ratio: 1.7 (ref 1.2–2.2)
Albumin: 4 g/dL (ref 3.8–4.9)
Alkaline Phosphatase: 172 IU/L — ABNORMAL HIGH (ref 44–121)
BUN/Creatinine Ratio: 20 (ref 9–20)
BUN: 19 mg/dL (ref 6–24)
Bilirubin Total: 0.7 mg/dL (ref 0.0–1.2)
CO2: 22 mmol/L (ref 20–29)
Calcium: 9.2 mg/dL (ref 8.7–10.2)
Chloride: 101 mmol/L (ref 96–106)
Creatinine, Ser: 0.94 mg/dL (ref 0.76–1.27)
Globulin, Total: 2.4 g/dL (ref 1.5–4.5)
Glucose: 296 mg/dL — ABNORMAL HIGH (ref 70–99)
Potassium: 4.8 mmol/L (ref 3.5–5.2)
Sodium: 137 mmol/L (ref 134–144)
Total Protein: 6.4 g/dL (ref 6.0–8.5)
eGFR: 95 mL/min/{1.73_m2} (ref >59)

## 2022-04-25 LAB — CBC WITH DIFFERENTIAL/PLATELET
Basophils Absolute: 0 10*3/uL (ref 0.0–0.2)
Basos: 0 %
EOS (ABSOLUTE): 0 10*3/uL (ref 0.0–0.4)
Eos: 1 %
Hematocrit: 48.3 % (ref 37.5–51.0)
Hemoglobin: 16.5 g/dL (ref 13.0–17.7)
Immature Grans (Abs): 0 10*3/uL (ref 0.0–0.1)
Immature Granulocytes: 0 %
Lymphocytes Absolute: 1.3 10*3/uL (ref 0.7–3.1)
Lymphs: 26 %
MCH: 29.8 pg (ref 26.6–33.0)
MCHC: 34.2 g/dL (ref 31.5–35.7)
MCV: 87 fL (ref 79–97)
Monocytes Absolute: 0.4 10*3/uL (ref 0.1–0.9)
Monocytes: 8 %
Neutrophils Absolute: 3.4 10*3/uL (ref 1.4–7.0)
Neutrophils: 65 %
Platelets: 178 10*3/uL (ref 150–450)
RBC: 5.53 x10E6/uL (ref 4.14–5.80)
RDW: 11.8 % (ref 11.6–15.4)
WBC: 5.2 10*3/uL (ref 3.4–10.8)

## 2022-04-28 NOTE — Progress Notes (Signed)
Chronic Care Management Pharmacy Note  04/20/2022 Name:  [name] MRN:  [id number] DOB:  07-02-1966  Summary: Diabetes: Uncontrolled (A1C 7.7%-->9.5%--INCREASED) Current treatment: TRESIBA 68-72 UNITS DAILY, OZEMPIC 1MG SQ WEEKLY;  Received notification from [facility] regarding 2023 RE-ENROLLMENT--they now state they do not have his enrollment--will have to resend a Last SHIPMENT patient got in JAN 2023 Phone: [phone] MEDICATION TO SHIP TO PCP OFFICE PER PROGRAM RULES CONTINUE OZEMPIC TO 1MG SQ WEEKLY AND TRESIBA 68-72 UNITS DAILY WILL HAVE TO AWAIT NEW SUPPLY FROM PATIENT ASSISTANCE-->increase Ozempic dose to 93m weekly, patient states Bgs are better but a1c not at goal; did not [place] Denies personal and family history of Medullary thyroid cancer (MTC) USES LIBRE CGM--CALLED IN LIBRE 2 cgm SYSTEM TO [facility] (ESCRIBED) Current glucose readings: fasting glucose: <150, post prandial glucose: <200 Denies hypoglycemic/hyperglycemic symptoms Discussed meal planning options and Plate method for healthy eating--ADMITS TO EATING INCREASED CARBOHYDRATES Avoid sugary drinks and desserts Incorporate balanced protein, non starchy veggies, 1 serving of carbohydrate with each meal Increase water intake Increase physical activity as able Current exercise: N/A Recommended POTENTIAL INCREASE IN GLP1 (OZEMPIC) Assessed patient finances. [place] [place]  [facility]   Subjective: Anthony FERGis an 56y.o. year old male who is a primary patient of Dettinger, JFransisca Kaufmann MD.  The CCM team was consulted for assistance with disease management and care coordination needs.    Engaged with patient by telephone for follow up visit in response to provider referral for pharmacy case management and/or care coordination services.   Consent to Services:  The patient was given information about Chronic Care Management services, agreed  to services, and gave verbal consent prior to initiation of services.  Please see initial visit note for detailed documentation.   Patient Care Team: Dettinger, JFransisca Kaufmann MD as PCP - General (Family Medicine) MSatira Sark MD as PCP - Cardiology (Cardiology) PLavera Guise [facility](Pharmacist) PIrene Shipper MD as Consulting Physician (Gastroenterology) LRalene Muskratas Physician Assistant (Chiropractic Medicine) JCelestia Khat OD (Optometry)  Objective:  Lab Results  Component Value Date   CREATININE 0.94 04/24/2022   CREATININE 0.96 10/25/2021   CREATININE 1.04 10/12/2021    Lab Results  Component Value Date   HGBA1C 9.7 (H) 04/24/2022   Last diabetic Eye exam:  Lab Results  Component Value Date/Time   HMDIABEYEEXA No Retinopathy 03/22/2022 12:00 AM    Last diabetic Foot exam: No results found for: "HMDIABFOOTEX"      Component Value Date/Time   CHOL 61 (L) 04/24/2022 1055   TRIG 130 04/24/2022 1055   HDL 23 (L) 04/24/2022 1055   CHOLHDL 2.7 04/24/2022 1055   CHOLHDL 4.6 01/30/2018 1347   VLDL 54 (H) 01/30/2018 1347   LC[place]15 04/24/2022 1055       Latest Ref Rng & Units 04/24/2022   10:55 AM 10/12/2021    3:41 PM 07/14/2021   10:42 AM  Hepatic Function  Total Protein 6.0 - 8.5 g/dL 6.4  6.6  6.5   Albumin 3.8 - 4.9 g/dL 4.0  4.2  4.2   AST 0 - 40 IU/L 24  14  46   ALT 0 - 44 IU/L 58  19  61   Alk Phosphatase 44 - 121 IU/L 172  143  138   Total Bilirubin 0.0 - 1.2 mg/dL 0.7  0.6  0.7     Lab Results  Component  Value Date/Time   TSH 0.961 12/03/2020 11:18 AM   TSH 0.929 10/23/2019 03:59 AM   TSH 1.490 03/09/2014 11:09 AM       Latest Ref Rng & Units 04/24/2022   10:55 AM 07/14/2021   10:42 AM 12/03/2020   11:18 AM  CBC  WBC 3.4 - 10.8 x10E3/uL 5.2  6.8  7.0   Hemoglobin 13.0 - 17.7 g/dL 16.5  15.7  16.3   Hematocrit 37.5 - 51.0 % 48.3  46.0  48.7   Platelets 150 - 450 x10E3/uL 178  249  219     No results found for: "VD25OH"  Clinical  ASCVD: No  The ASCVD Risk score (Arnett DK, et al., 2019) failed to calculate for the following reasons:   The valid total cholesterol range is 130 to 320 mg/dL    Other: (CHADS2VASc if Afib, PHQ9 if depression, MMRC or CAT for COPD, ACT, DEXA)  Social History   Tobacco Use  Smoking Status Never  Smokeless Tobacco Never   BP Readings from Last 3 Encounters:  04/24/22 121/77  02/03/22 118/80  01/16/22 (!) 147/81   Pulse Readings from Last 3 Encounters:  04/24/22 94  02/03/22 84  01/16/22 85   Wt Readings from Last 3 Encounters:  04/24/22 209 lb (94.8 kg)  02/03/22 223 lb (101.2 kg)  01/16/22 223 lb (101.2 kg)    Assessment: Review of patient past medical history, allergies, medications, health status, including review of consultants reports, laboratory and other test data, was performed as part of comprehensive evaluation and provision of chronic care management services.   SDOH:  (Social Determinants of Health) assessments and interventions performed:    CCM Care Plan  No Known Allergies  Medications Reviewed Today     Reviewed by Lavera Guise, [facility] (Pharmacist) on 04/28/22 at [place] List Status: <None>   Medication Order Taking? Sig Documenting Provider Last Dose Status Informant  apixaban (ELIQUIS) 5 MG TABS tablet [id number] No Take 1 tablet (5 mg total) by mouth 2 (two) times daily. Satira Sark, MD Taking Active   atorvastatin (LIPITOR) 20 MG tablet [id number]  Take 1 tablet (20 mg total) by mouth daily. Dettinger, [name], MD  Active   Continuous Blood Gluc Receiver (FREESTYLE LIBRE 2 READER) DEVI [id number] No Use as directed to test blood sugar up to 6 times daily as directed. DX: E 11.9 Dettinger, [name], MD Taking Active            Med Note Blanca Friend, Sherian Maroon Nov 17, 2021 10:56 AM) VIA [facility] VIA [place] [facility]   Continuous Blood Gluc Sensor (FREESTYLE LIBRE 2 SENSOR) MISC [id number] No Use as directed to test blood sugar up to 6  times daily as directed. DX: E 11.9 Dettinger, [name], MD Taking Active            Med Note Blanca Friend, Sherian Maroon Nov 17, 2021 10:56 AM) VIA [facility] VIA [place] PORT[facility]  DULoxetine (CYMBALTA) 60 MG capsule [id number]  Take 1 capsule (60 mg total) by mouth daily. Dettinger, [name], MD  Active   fluticasone [facility]) 50 MCG/ACT nasal spray [id number] No Place 1 spray into both nostrils 2 (two) times daily as needed for allergies or rhinitis. Dettinger, [name], MD Taking Active Self  gabapentin (NEURONTIN) 600 MG tablet [id number]  Take 1 tablet (600 mg total) by mouth 3 (three) times daily. Dettinger, [name], MD  Active   HYDROcodone-acetaminophen [facility])  10-325 MG tablet [id number]  Take 1 tablet by mouth 2 (two) times daily as needed. Dettinger, [name], MD  Active   HYDROcodone-acetaminophen [facility]) 10-325 MG tablet [id number]  Take 1 tablet by mouth 2 (two) times daily as needed. Dettinger, [name], MD  Active   HYDROcodone-acetaminophen [facility]) 10-325 MG tablet [id number]  Take 1 tablet by mouth 2 (two) times daily as needed. Dettinger, [name], MD  Active   insulin degludec (TRESIBA FLEXTOUCH) 100 UNIT/ML FlexTouch Pen [id number] No Inject 50-70 Units into the skin daily. [provider] Taking Active            Med Note Blanca Friend, Royce Macadamia   Tue Jun 28, 2021 11:55 AM) VIA NOVO [employer]ISK PATIENT ASSISTANCE PROGRAM  metFORMIN (GLUCOPHAGE) 1000 MG tablet [id number]  Take 1 tablet (1,000 mg total) by mouth 2 (two) times daily. Dettinger, [name], MD  Active   metoprolol succinate (TOPROL-XL) 25 MG 24 hr tablet [id number] No TAKE 1 TABLET BY MOUTH EVERY DAY Satira Sark, MD Taking Active   Multiple Vitamins-Minerals (MULTIVITAMIN WITH MINERALS) tablet [id number] No Take 1 tablet by mouth daily. [provider] Taking Active Self  Semaglutide, 1 MG/DOSE, 4 MG/3ML SOPN [id number] No Inject 1 mg as directed once a week. Dettinger, [name], MD Taking Active            Med  Note Blanca Friend, Royce Macadamia   Tue Feb 21, 2022  8:36 AM) Via novo nordisk patient assistance program              Patient Active Problem List   Diagnosis Date Noted   Dyslipidemia 07/14/2021   S/P BKA (below knee amputation) unilateral, right ([place]) 02/19/2020   Osteomyelitis of ankle and foot (HCC)    Abscess of tendon sheath, right ankle and foot 12/24/2019   Bacteremia due to methicillin susceptible Staphylococcus aureus (MSSA) 12/24/2019   History of DVT (deep vein thrombosis) 10/26/2019   A-fib ([place]) 10/22/2019   Lumbar spine scoliosis 08/13/2018   S/P BKA (below knee amputation) unilateral, left ([place]) 01/12/2018   Pain management contract signed 05/04/2017   Type 2 diabetes mellitus ([place]) 07/22/2015   Neuropathy in diabetes ([place]) 03/31/2014   Essential hypertension, benign 03/03/2013   DDD (degenerative disc disease), lumbar 03/03/2013    Immunization History  Administered Date(s) Administered   Influenza, Seasonal, Injecte, Preservative Fre 10/15/2015, 08/05/2016   Influenza,inj,Quad PF,6+ Mos 10/15/2015, 08/05/2016, 08/21/2017, 08/07/2018, 07/22/2019, 09/01/2020, 10/12/2021   Moderna Sars-Covid-2 Vaccination 05/20/2020, 06/14/2020   Pneumococcal Conjugate-13 07/22/2019, 07/08/2020   Tdap 07/08/2020   Zoster Recombinat (Shingrix) 06/30/2019, 10/06/2019    Conditions to be addressed/monitored: DMII  Care Plan : PHARMD MEDICATION MANAGEMENT  Updates made by Lavera Guise, RPH since 04/28/2022 12:00 AM     Problem: DISEASE PROGRESSION PREVENTION      Long-Range Goal: T2DM, HTN   Recent Progress: Not on track  Priority: High  Note:   Current Barriers:  Unable to independently afford treatment regimen Unable to maintain control of T2DM  Pharmacist Clinical Goal(s):  Over the next 90 days, patient will verbalize ability to afford treatment regimen maintain control of T2DM as evidenced by IMPROVED GLYCEMIC CONTROL  through collaboration with PharmD and provider.    Interventions: 1:1 collaboration with Dettinger, [name], MD regarding development and update of comprehensive plan of care as evidenced by provider attestation and co-signature Inter-disciplinary care team collaboration (see longitudinal plan of care) Comprehensive medication review performed; medication list updated in electronic medical record  Diabetes: Uncontrolled (A1C 7.7%-->9.5%--INCREASED) Current treatment: TRESIBA 68-72 UNITS DAILY, OZEMPIC 1MG SQ WEEKLY;  Received notification from [place] regarding 2023 RE-ENROLLMENT--they now state they do not have his enrollment--will have to resend a Last SHIPMENT patient got in JAN 2023 Phone: [phone] MEDICATION TO SHIP TO PCP OFFICE PER PROGRAM RULES CONTINUE OZEMPIC TO 1MG SQ WEEKLY AND TRESIBA 68-72 UNITS DAILY WILL HAVE TO AWAIT NEW SUPPLY FROM PATIENT ASSISTANCE-->increase Ozempic dose to 85m weekly, patient states Bgs are better but a1c not at goal; did not [place] in [place] Denies personal and family history of Medullary thyroid cancer (MTC) USES LIBRE CGM--CALLED IN LIBRE 2 cgm SYSTEM TO [facility] (ESCRIBED) Current glucose readings: fasting glucose: <150, post prandial glucose: <200 Denies hypoglycemic/hyperglycemic symptoms Discussed meal planning options and Plate method for healthy eating--ADMITS TO EATING INCREASED CARBOHYDRATES Avoid sugary drinks and desserts Incorporate balanced protein, non starchy veggies, 1 serving of carbohydrate with each meal Increase water intake Increase physical activity as able Current exercise: N/A Recommended POTENTIAL INCREASE IN GLP1 (OZEMPIC) Assessed patient finances. [place]  [place]  Hypertension: -DISCUSSED BLOOD PRESSURE-- Would benefit from ACEi/ARB for diabetes, will address with PCP at patient's next visit.  ON METOPROLOL FOR AFIB PER CARDS He was previously on lisinopril per chart notes: "BP was soft last  month, therefore his PCP discontinued Lisinopril.  He was discharged on Cardizem CD but refills were not provided, therefore he discontinued this several weeks ago.  BP is at goal <130/80  Patient Goals/Self-Care Activities Over the next 90 days, patient will:  - take medications as prescribed check glucose USING LIBRE CGM, document, and provide at future appointments collaborate with provider on medication access solutions  Follow Up Plan: Telephone follow up appointment with care management team member scheduled for: 1 MONTH       JRegina Eck PharmD, BCPS Clinical Pharmacist, [place] Phone [phone]

## 2022-05-05 DIAGNOSIS — Z7984 Long term (current) use of oral hypoglycemic drugs: Secondary | ICD-10-CM

## 2022-05-05 DIAGNOSIS — Z794 Long term (current) use of insulin: Secondary | ICD-10-CM

## 2022-05-05 DIAGNOSIS — E1159 Type 2 diabetes mellitus with other circulatory complications: Secondary | ICD-10-CM | POA: Diagnosis not present

## 2022-05-05 DIAGNOSIS — I10 Essential (primary) hypertension: Secondary | ICD-10-CM

## 2022-05-13 DIAGNOSIS — E1165 Type 2 diabetes mellitus with hyperglycemia: Secondary | ICD-10-CM | POA: Diagnosis not present

## 2022-05-17 ENCOUNTER — Ambulatory Visit (INDEPENDENT_AMBULATORY_CARE_PROVIDER_SITE_OTHER): Payer: Medicare Other | Admitting: Pharmacist

## 2022-05-17 DIAGNOSIS — I4811 Longstanding persistent atrial fibrillation: Secondary | ICD-10-CM

## 2022-05-17 DIAGNOSIS — E1122 Type 2 diabetes mellitus with diabetic chronic kidney disease: Secondary | ICD-10-CM

## 2022-05-17 NOTE — Progress Notes (Signed)
Chronic Care Management Pharmacy Note  05/17/2022 Name:  [name] MRN:  [id number] DOB:  August 19, 1966  Summary:  Diabetes: Uncontrolled (A1C 7.7%-->9.5%--INCREASED) Current treatment: TRESIBA 68-72 UNITS DAILY, OZEMPIC 1MG SQ WEEKLY-->increasing to 68m once patient assistance supply arrives;  Received [place] from [place] 2023 RE-ENROLLMENT Phone: [phone] MEDICATION TO SHIP TO PCP OFFICE PER PROGRAM RULES CONTINUE OZEMPIC TO 1MG SQ WEEKLY AND TRESIBA 68-72 UNITS DAILY WILL HAVE TO AWAIT NEW SUPPLY FROM PATIENT ASSISTANCE-->increase Ozempic dose to 262mweekly, patient states Bgs are better but a1c not at goal Denies personal and family history of Medullary thyroid cancer (MTC) USES LIBRE CGM--CALLED IN LIBRE 2 cgm ADS Current glucose readings: fasting glucose: <150, post prandial glucose: <200 Denies hypoglycemic/hyperglycemic symptoms Discussed meal planning options and Plate method for healthy eating--ADMITS TO EATING INCREASED CARBOHYDRATES Avoid sugary drinks and desserts Incorporate balanced protein, non starchy veggies, 1 serving of carbohydrate with each meal Increase water intake Increase physical activity as able Current exercise: N/A Recommended INCREASE IN GLP1 (OZEMPIC) Assessed patient finances. [place]  ([place] Hypertension: -DISCUSSED BLOOD PRESSURE-- Would benefit from ACEi/ARB for diabetes, will address with PCP at patient's next visit.  ON METOPROLOL FOR AFIB PER CARDS He was previously on lisinopril per chart notes: "BP was soft last month, therefore his PCP discontinued Lisinopril.  He was discharged on Cardizem CD but refills were not provided, therefore he discontinued this several weeks ago.  BP is at goal <130/80  Patient Goals/Self-Care Activities Over the next 90 days, patient will:  - take medications as prescribed check glucose USING LIBRE CGM, document, and provide at  future appointments collaborate with provider on medication access solutions  Follow Up Plan: Telephone follow up appointment with care management team member scheduled for: 1 MONTH   Subjective: Anthony CHOUINARDs an [age].o. year old male who is a primary patient of Dettinger, JoFransisca KaufmannMD.  The CCM team was consulted for assistance with disease management and care coordination needs.    Engaged with patient by telephone for follow up visit in response to provider referral for pharmacy case management and/or care coordination services.   Consent to Services:  The patient was given information about Chronic Care Management services, agreed to services, and gave verbal consent prior to initiation of services.  Please see initial visit note for detailed documentation.   Patient Care Team: Dettinger, JoFransisca KaufmannMD as PCP - General (Family Medicine) McSatira SarkMD as PCP - Cardiology (Cardiology) PrLavera GuiseRPVibra [facility] CampusPharmacist) PeIrene ShipperMD as Consulting Physician (Gastroenterology) LiRalene Muskrats Physician Assistant (Chiropractic Medicine) JoCelestia KhatOD (Optometry)  Objective:  Lab Results  Component Value Date   CREATININE 0.94 04/24/2022   CREATININE 0.96 10/25/2021   CREATININE 1.04 10/12/2021    Lab Results  Component Value Date   HGBA1C 9.7 (H) 04/24/2022   Last diabetic Eye exam:  Lab Results  Component Value Date/Time   HMDIABEYEEXA No Retinopathy 03/22/2022 12:00 AM      Component Value Date/Time   CHOL 61 (L) 04/24/2022 1055   TRIG 130 04/24/2022 1055   HDL 23 (L) 04/24/2022 1055   CHOLHDL 2.7 04/24/2022 1055   CHOLHDL 4.6 01/30/2018 1347   VLDL 54 (H) 01/30/2018 1347   LD[place]5 04/24/2022 1055       Latest Ref Rng & Units 04/24/2022   10:55 AM 10/12/2021    3:41 PM 07/14/2021  10:42 AM  Hepatic Function  Total Protein 6.0 - 8.5 g/dL 6.4  6.6  6.5   Albumin 3.8 - 4.9 g/dL 4.0  4.2  4.2   AST 0 - 40 IU/L 24  14  46   ALT 0 - 44  IU/L 58  19  61   Alk Phosphatase 44 - 121 IU/L 172  143  138   Total Bilirubin 0.0 - 1.2 mg/dL 0.7  0.6  0.7     Lab Results  Component Value Date/Time   TSH 0.961 12/03/2020 11:18 AM   TSH 0.929 10/23/2019 03:59 AM   TSH 1.490 03/09/2014 11:09 AM       Latest Ref Rng & Units 04/24/2022   10:55 AM 07/14/2021   10:42 AM 12/03/2020   11:18 AM  CBC  WBC 3.4 - 10.8 x10E3/uL 5.2  6.8  7.0   Hemoglobin 13.0 - 17.7 g/dL 16.5  15.7  16.3   Hematocrit 37.5 - 51.0 % 48.3  46.0  48.7   Platelets 150 - 450 x10E3/uL 178  249  219     No results found for: "VD25OH"  Clinical ASCVD: No  The ASCVD Risk score (Arnett DK, et al., 2019) failed to calculate for the following reasons:   The valid total cholesterol range is 130 to 320 mg/dL    Other: (CHADS2VASc if Afib, PHQ9 if depression, MMRC or CAT for COPD, ACT, DEXA)  Social History   Tobacco Use  Smoking Status Never  Smokeless Tobacco Never   BP Readings from Last 3 Encounters:  04/24/22 121/77  02/03/22 118/80  01/16/22 (!) 147/81   Pulse Readings from Last 3 Encounters:  04/24/22 94  02/03/22 84  01/16/22 85   Wt Readings from Last 3 Encounters:  04/24/22 209 lb (94.8 kg)  02/03/22 223 lb (101.2 kg)  01/16/22 223 lb (101.2 kg)    Assessment: Review of patient past medical history, allergies, medications, health status, including review of consultants reports, laboratory and other test data, was performed as part of comprehensive evaluation and provision of chronic care management services.   SDOH:  (Social Determinants of Health) assessments and interventions performed:    CCM Care Plan  No Known Allergies  Medications Reviewed Today     Reviewed by Lavera Guise, [place] (Pharmacist) on 04/28/22 at [place] [facility] Status: <None>   Medication Order Taking? Sig Documenting Provider Last Dose Status Informant  apixaban (ELIQUIS) 5 MG TABS tablet [id number] No Take 1 tablet (5 mg total) by mouth 2 (two) times daily.  Satira Sark, MD Taking Active   atorvastatin (LIPITOR) 20 MG tablet [id number]  Take 1 tablet (20 mg total) by mouth daily. Dettinger, [name], MD  Active   Continuous Blood Gluc Receiver (FREESTYLE LIBRE 2 READER) DEVI [id number] No Use as directed to test blood sugar up to 6 times daily as directed. DX: E 11.9 Dettinger, [name], MD Taking Active            Med Note Blanca Friend, Sherian Maroon Nov 17, 2021 10:56 AM) [facility]IA [facility] [place] [facility]   Continuous Blood Gluc Sensor (FREESTYLE LIBRE 2 SENSOR) MISC [id number] No Use as directed to test blood sugar up to 6 times daily as directed. DX: E 11.9 Dettinger, [name], MD Taking Active            Med Note Blanca Friend, Sherian Maroon Nov 17, 2021 10:56 AM) [facility]IA [facility] [place]  PORTAL  DULoxetine (CYMBALTA) 60 MG capsule [id number]  Take 1 capsule (60 mg total) by mouth daily. Dettinger, [name], MD  Active   fluticasone [facility]) 50 MCG/ACT nasal spray [id number] No Place 1 spray into both nostrils 2 (two) times daily as needed for allergies or rhinitis. Dettinger, [name], MD Taking Active Self  gabapentin (NEURONTIN) 600 MG tablet [id number]  Take 1 tablet (600 mg total) by mouth 3 (three) times daily. Dettinger, [name], MD  Active   HYDROcodone-acetaminophen [facility]) 10-325 MG tablet [id number]  Take 1 tablet by mouth 2 (two) times daily as needed. Dettinger, [name], MD  Active   HYDROcodone-acetaminophen [facility]) 10-325 MG tablet [id number]  Take 1 tablet by mouth 2 (two) times daily as needed. Dettinger, [name], MD  Active   HYDROcodone-acetaminophen [facility]) 10-325 MG tablet [id number]  Take 1 tablet by mouth 2 (two) times daily as needed. Dettinger, [name], MD  Active   insulin degludec (TRESIBA FLEXTOUCH) 100 UNIT/ML FlexTouch Pen [id number] No Inject 50-70 Units into the skin daily. [provider] Taking Active            Med Note Blanca Friend, Royce Macadamia   Tue Jun 28, 2021 11:55 AM) VIA NOVO [employer]ORDISK PATIENT  ASSISTANCE PROGRAM  metFORMIN (GLUCOPHAGE) 1000 MG tablet [id number]  Take 1 tablet (1,000 mg total) by mouth 2 (two) times daily. Dettinger, [name], MD  Active   metoprolol succinate (TOPROL-XL) 25 MG 24 hr tablet [id number] No TAKE 1 TABLET BY MOUTH EVERY DAY Satira Sark, MD Taking Active   Multiple Vitamins-Minerals (MULTIVITAMIN WITH MINERALS) tablet [id number] No Take 1 tablet by mouth daily. [provider] Taking Active Self  Semaglutide, 1 MG/DOSE, 4 MG/3ML SOPN [id number] No Inject 1 mg as directed once a week. Dettinger, [name], MD Taking Active            Med Note Blanca Friend, Royce Macadamia   Tue Feb 21, 2022  8:36 AM) Via novo nordisk patient assistance program              Patient Active Problem List   Diagnosis Date Noted   Dyslipidemia 07/14/2021   S/P BKA (below knee amputation) unilateral, right ([place]) 02/19/2020   Osteomyelitis of ankle and foot (HCC)    Abscess of tendon sheath, right ankle and foot 12/24/2019   Bacteremia due to methicillin susceptible Staphylococcus aureus (MSSA) 12/24/2019   History of DVT (deep vein thrombosis) 10/26/2019   A-fib ([place]) 10/22/2019   Lumbar spine scoliosis 08/13/2018   S/P BKA (below knee amputation) unilateral, left ([place]) 01/12/2018   Pain management contract signed 05/04/2017   Type 2 diabetes mellitus ([place]) 07/22/2015   Neuropathy in diabetes ([place]) 03/31/2014   Essential hypertension, benign 03/03/2013   DDD (degenerative disc disease), lumbar 03/03/2013    Immunization History  Administered Date(s) Administered   Influenza, Seasonal, Injecte, Preservative Fre 10/15/2015, 08/05/2016   Influenza,inj,Quad PF,6+ Mos 10/15/2015, 08/05/2016, 08/21/2017, 08/07/2018, 07/22/2019, 09/01/2020, 10/12/2021   Moderna Sars-Covid-2 Vaccination 05/20/2020, 06/14/2020   Pneumococcal Conjugate-13 07/22/2019, 07/08/2020   Tdap 07/08/2020   Zoster Recombinat (Shingrix) 06/30/2019, 10/06/2019    Conditions to be  addressed/monitored: DMII  Care Plan : PHARMD MEDICATION MANAGEMENT  Updates made by Lavera Guise, RPH since 05/19/2022 12:00 AM     Problem: DISEASE PROGRESSION PREVENTION      Long-Range Goal: T2DM, HTN   Recent Progress: Not on track  Priority: High  Note:   Current Barriers:  Unable to independently afford treatment regimen  Unable to maintain control of T2DM  Pharmacist Clinical Goal(s):  Over the next 90 days, patient will verbalize ability to afford treatment regimen maintain control of T2DM as evidenced by IMPROVED GLYCEMIC CONTROL  through collaboration with PharmD and provider.   Interventions: 1:1 collaboration with Dettinger, [name], MD regarding development and update of comprehensive plan of care as evidenced by provider attestation and co-signature Inter-disciplinary care team collaboration (see longitudinal plan of care) Comprehensive medication review performed; medication list updated in electronic medical record  Diabetes: Uncontrolled (A1C 7.7%-->9.5%--INCREASED) Current treatment: TRESIBA 68-72 UNITS DAILY, OZEMPIC 1MG SQ WEEKLY-->increasing to 44m once patient assistance supply arrives;  Received notification from [place] 20[place] RE-ENROLLMENT Phone: [phone] MEDICATION TO SHIP TO PCP OFFICE PER PROGRAM RULES CONTINUE OZEMPIC TO 1MG SQ WEEKLY AND TRESIBA 68-72 UNITS DAILY WILL HAVE TO AWAIT NEW SUPPLY FROM PATIENT ASSISTANCE-->increase Ozempic dose to 222mweekly, patient states Bgs are better but a1c not at goal Denies personal and family history of Medullary thyroid cancer (MTC) USES LIBRE CGM--CALLED IN LIBRE 2 cgm ADS Current glucose readings: fasting glucose: <150, post prandial glucose: <200 Denies hypoglycemic/hyperglycemic symptoms Discussed meal planning options and Plate method for healthy eating--ADMITS TO EATING INCREASED CARBOHYDRATES Avoid sugary drinks and desserts Incorporate balanced protein, non starchy veggies, 1  serving of carbohydrate with each meal Increase water intake Increase physical activity as able Current exercise: N/A Recommended INCREASE IN GLP1 (OZEMPIC) Assessed patient finances. [place]  [place] Hypertension: -DISCUSSED BLOOD PRESSURE-- Would benefit from ACEi/ARB for diabetes, will address with PCP at patient's next visit.  ON METOPROLOL FOR AFIB PER CARDS He was previously on lisinopril per chart notes: "BP was soft last month, therefore his PCP discontinued Lisinopril.  He was discharged on Cardizem CD but refills were not provided, therefore he discontinued this several weeks ago.  BP is at goal <130/80  Patient Goals/Self-Care Activities Over the next 90 days, patient will:  - take medications as prescribed check glucose USING LIBRE CGM, document, and provide at future appointments collaborate with provider on medication access solutions  Follow Up Plan: Telephone follow up appointment with care management team member scheduled for: 1 MONTH     Follow Up:  Patient agrees to Care Plan and Follow-up.  Plan: Telephone follow up appointment with care management team member scheduled for:  1 MONTH   JuRegina EckPharmD, BCPS Clinical Pharmacist, [place] Phone [phone]

## 2022-05-19 NOTE — Patient Instructions (Signed)
Visit Information  Following are the goals we discussed today:  Current Barriers:  Unable to independently afford treatment regimen Unable to maintain control of T2DM  Pharmacist Clinical Goal(s):  Over the next 90 days, patient will verbalize ability to afford treatment regimen maintain control of T2DM as evidenced by IMPROVED GLYCEMIC CONTROL  through collaboration with PharmD and provider.   Interventions: 1:1 collaboration with Dettinger, Elige Radon, MD regarding development and update of comprehensive plan of care as evidenced by provider attestation and co-signature Inter-disciplinary care team collaboration (see longitudinal plan of care) Comprehensive medication review performed; medication list updated in electronic medical record  Diabetes: Uncontrolled (A1C 7.7%-->9.5%--INCREASED) Current treatment: TRESIBA 68-72 UNITS DAILY, OZEMPIC 1MG  SQ WEEKLY-->increasing to 2mg  once patient assistance supply arrives;  Received notification from NOVO [place]ISK regarding 2023 RE-ENROLLMENT Phone: [phone]; MEDICATION TO SHIP TO PCP OFFICE PER PROGRAM RULES CONTINUE OZEMPIC TO 1MG  SQ WEEKLY AND TRESIBA 68-72 UNITS DAILY WILL HAVE TO AWAIT NEW SUPPLY FROM PATIENT ASSISTANCE-->increase Ozempic dose to 2mg  weekly, patient states Bgs are better but a1c not at goal Denies personal and family history of Medullary thyroid cancer (MTC) USES LIBRE CGM--CALLED IN LIBRE 2 cgm ADS Current glucose readings: fasting glucose: <150, post prandial glucose: <200 Denies hypoglycemic/hyperglycemic symptoms Discussed meal planning options and Plate method for healthy eating--ADMITS TO EATING INCREASED CARBOHYDRATES Avoid sugary drinks and desserts Incorporate balanced protein, non starchy veggies, 1 serving of carbohydrate with each meal Increase water intake Increase physical activity as able Current exercise: N/A Recommended INCREASE IN GLP1 (OZEMPIC) Assessed patient finances. PATIENT RE-ENROLLED FOR  NOVO [place] 2023  (TRESIBA AND OZEMPIC)  Hypertension: -DISCUSSED BLOOD PRESSURE-- Would benefit from ACEi/ARB for diabetes, will address with PCP at patient's next visit.  ON METOPROLOL FOR AFIB PER CARDS He was previously on lisinopril per chart notes: "BP was soft last month, therefore his PCP discontinued Lisinopril.  He was discharged on Cardizem CD but refills were not provided, therefore he discontinued this several weeks ago.  BP is at goal <130/80  Patient Goals/Self-Care Activities Over the next 90 days, patient will:  - take medications as prescribed check glucose USING LIBRE CGM, document, and provide at future appointments collaborate with provider on medication access solutions  Follow Up Plan: Telephone follow up appointment with care management team member scheduled for: 1 MONTH   Plan: Telephone follow up appointment with care management team member scheduled for:  09/2022  Signature , PharmD, BCPS Clinical Pharmacist, [facility]  II Phone [phone]   Please call the care guide team at [phone] if you need to cancel or reschedule your appointment.   The patient verbalized understanding of instructions, educational materials, and care plan provided today and DECLINED offer to receive copy of patient instructions, educational materials, and care plan.

## 2022-06-05 ENCOUNTER — Encounter: Payer: Self-pay | Admitting: Family Medicine

## 2022-06-05 ENCOUNTER — Ambulatory Visit (INDEPENDENT_AMBULATORY_CARE_PROVIDER_SITE_OTHER): Payer: Medicare Other | Admitting: Family Medicine

## 2022-06-05 ENCOUNTER — Ambulatory Visit: Payer: Medicare Other

## 2022-06-05 ENCOUNTER — Ambulatory Visit (INDEPENDENT_AMBULATORY_CARE_PROVIDER_SITE_OTHER): Payer: Medicare Other

## 2022-06-05 VITALS — BP 124/78 | HR 88 | Temp 98.9°F | Ht 74.0 in | Wt 203.8 lb

## 2022-06-05 DIAGNOSIS — S63641A Sprain of metacarpophalangeal joint of right thumb, initial encounter: Secondary | ICD-10-CM | POA: Diagnosis not present

## 2022-06-05 DIAGNOSIS — E1122 Type 2 diabetes mellitus with diabetic chronic kidney disease: Secondary | ICD-10-CM | POA: Diagnosis not present

## 2022-06-05 DIAGNOSIS — Z794 Long term (current) use of insulin: Secondary | ICD-10-CM | POA: Diagnosis not present

## 2022-06-05 DIAGNOSIS — M79644 Pain in right finger(s): Secondary | ICD-10-CM | POA: Diagnosis not present

## 2022-06-05 DIAGNOSIS — M1811 Unilateral primary osteoarthritis of first carpometacarpal joint, right hand: Secondary | ICD-10-CM | POA: Diagnosis not present

## 2022-06-05 DIAGNOSIS — I4811 Longstanding persistent atrial fibrillation: Secondary | ICD-10-CM | POA: Diagnosis not present

## 2022-06-05 NOTE — Progress Notes (Signed)
Chief Complaint  Patient presents with   Fall   Finger Injury    Right thumb    HPI  Patient presents today for fall on outstretched right hand last week. DOI 05/30/22. Now pain at right 1st MCP for opposition and flexion  PMH: Smoking status noted ROS: Per HPI  Objective: BP 124/78   Pulse 88   Temp 98.9 F (37.2 C)   Ht 6\' 2"  (1.88 m)   Wt 203 lb 12.8 oz (92.4 kg)   SpO2 97%   BMI 26.17 kg/m  Gen: NAD, alert, cooperative with exam HEENT: NCAT, EOMI, PERRL Ext: No edema, warm. Moderate tenderness with flexion and opposition of the thumb at the MCP dorsally. NV intact R thumb Neuro: Alert and oriented, No gross deficits  Assessment and plan:  1. Sprain of metacarpophalangeal (MCP) joint of right thumb, initial encounter     No orders of the defined types were placed in this encounter.   Orders Placed This Encounter  Procedures   DG Finger Thumb Right    Standing Status:   Future    Number of Occurrences:   1    Standing Expiration Date:   07/06/2022    Order Specific Question:   Reason for Exam (SYMPTOM  OR DIAGNOSIS REQUIRED)    Answer:   pain at 1st MCP    Order Specific Question:   Preferred imaging location?    Answer:   Internal   DG Hand Complete Right    Standing Status:   Future    Number of Occurrences:   1    Standing Expiration Date:   07/06/2022    Order Specific Question:   Reason for Exam (SYMPTOM  OR DIAGNOSIS REQUIRED)    Answer:   pain at 1st MCP    Order Specific Question:   Preferred imaging location?    Answer:   Internal   Wear spica until pain clears. ROM QID reviewed. Ice several times a day. Follow up as needed.  07/08/2022, MD

## 2022-06-13 ENCOUNTER — Ambulatory Visit: Payer: Medicare Other | Admitting: Pharmacist

## 2022-06-13 DIAGNOSIS — E1165 Type 2 diabetes mellitus with hyperglycemia: Secondary | ICD-10-CM | POA: Diagnosis not present

## 2022-06-20 ENCOUNTER — Telehealth: Payer: Self-pay | Admitting: Family Medicine

## 2022-06-28 ENCOUNTER — Telehealth: Payer: Self-pay

## 2022-06-28 NOTE — Telephone Encounter (Signed)
Patient assistance revived- ozempic  Patient aware- medication placed in fridge

## 2022-07-04 ENCOUNTER — Ambulatory Visit: Payer: Medicare Other | Admitting: Pharmacist

## 2022-07-14 DIAGNOSIS — E1165 Type 2 diabetes mellitus with hyperglycemia: Secondary | ICD-10-CM | POA: Diagnosis not present

## 2022-07-18 ENCOUNTER — Telehealth: Payer: Medicare Other

## 2022-07-26 ENCOUNTER — Encounter: Payer: Self-pay | Admitting: Family Medicine

## 2022-07-26 ENCOUNTER — Ambulatory Visit (INDEPENDENT_AMBULATORY_CARE_PROVIDER_SITE_OTHER): Payer: Medicare Other | Admitting: Family Medicine

## 2022-07-26 VITALS — BP 136/79 | HR 89 | Temp 98.3°F | Ht 74.0 in | Wt 198.0 lb

## 2022-07-26 DIAGNOSIS — Z23 Encounter for immunization: Secondary | ICD-10-CM

## 2022-07-26 DIAGNOSIS — E1122 Type 2 diabetes mellitus with diabetic chronic kidney disease: Secondary | ICD-10-CM | POA: Diagnosis not present

## 2022-07-26 DIAGNOSIS — E785 Hyperlipidemia, unspecified: Secondary | ICD-10-CM

## 2022-07-26 DIAGNOSIS — I10 Essential (primary) hypertension: Secondary | ICD-10-CM

## 2022-07-26 DIAGNOSIS — Z794 Long term (current) use of insulin: Secondary | ICD-10-CM | POA: Diagnosis not present

## 2022-07-26 DIAGNOSIS — E1142 Type 2 diabetes mellitus with diabetic polyneuropathy: Secondary | ICD-10-CM

## 2022-07-26 DIAGNOSIS — M5136 Other intervertebral disc degeneration, lumbar region: Secondary | ICD-10-CM | POA: Diagnosis not present

## 2022-07-26 DIAGNOSIS — Z89512 Acquired absence of left leg below knee: Secondary | ICD-10-CM

## 2022-07-26 DIAGNOSIS — Z0289 Encounter for other administrative examinations: Secondary | ICD-10-CM

## 2022-07-26 DIAGNOSIS — E1140 Type 2 diabetes mellitus with diabetic neuropathy, unspecified: Secondary | ICD-10-CM

## 2022-07-26 LAB — BAYER DCA HB A1C WAIVED: HB A1C (BAYER DCA - WAIVED): 8.1 % — ABNORMAL HIGH (ref 4.8–5.6)

## 2022-07-26 MED ORDER — HYDROCODONE-ACETAMINOPHEN 10-325 MG PO TABS: 1.0000 | ORAL_TABLET | Freq: Two times a day (BID) | ORAL | 0 refills | Status: DC | PRN

## 2022-07-26 NOTE — Progress Notes (Signed)
BP 136/79   Pulse 89   Temp 98.3 F (36.8 C)   Ht 6\' 2"  (1.88 m)   Wt 198 lb (89.8 kg)   SpO2 97%   BMI 25.42 kg/m    Subjective:   Patient ID: [name], male    DOB: [birth year], 56 y.o.   MRN: [id number]  HPI: [name] is a 56 y.o. male presenting on 07/26/2022 for Medical Management of Chronic Issues, Diabetes, and Hypertension   HPI Type 2 diabetes mellitus Patient comes in today for recheck of his diabetes. Patient has been currently taking Tresiba 72 units daily and metformin and Ozempic 1 mg, he supposed to up to 2 but he still has the 1 so he will finish off that before going up to the 2 mg.. Patient is not currently on an ACE inhibitor/ARB. Patient has seen an ophthalmologist this year. Patient has bilateral BKA. The symptom started onset as an adult bilateral BKA, hypertension and hyperlipidemia and A-fib ARE RELATED TO DM   Hypertension Patient is currently on metoprolol, and their blood pressure today is 136/79. Patient denies any lightheadedness or dizziness. Patient denies headaches, blurred vision, chest pains, shortness of breath, or weakness. Denies any side effects from medication and is content with current medication.   Pain assessment: Cause of pain-degenerative disc disease lumbar Pain location-lower back and down legs Pain on scale of 1-10- 5 Frequency-Daily What increases pain-being on feet What makes pain Better-we will ask you to add medication Effects on ADL -some limitations from this but also bilateral BKA's admit him as well Any change in general medical condition-none  Current opioids rx-hydrocodone-acetaminophen 10-3 25 twice daily as needed # meds rx-60 Effectiveness of current meds-works well Adverse reactions from pain meds-none Morphine equivalent-20  Pill count performed-No Last drug screen -01/20/2022 ( high risk q28m, moderate risk q1m, low risk yearly ) Urine drug screen today- No Was the [facility] reviewed-yes  If yes were  their any concerning findings? -None  Pain contract signed on: 01/20/2022  Relevant past medical, surgical, family and social history reviewed and updated as indicated. Interim medical history since our last visit reviewed. Allergies and medications reviewed and updated.  Review of Systems  Constitutional:  Negative for chills and fever.  Eyes:  Negative for visual disturbance.  Respiratory:  Negative for shortness of breath and wheezing.   Cardiovascular:  Negative for chest pain and leg swelling.  Musculoskeletal:  Positive for arthralgias and gait problem.  Skin:  Negative for rash.  Neurological:  Negative for dizziness, weakness and light-headedness.  All other systems reviewed and are negative.   Per HPI unless specifically indicated above   Allergies as of 07/26/2022   No Known Allergies      Medication List        Accurate as of July 26, 2022 11:27 AM. If you have any questions, ask your nurse or doctor.          apixaban 5 MG Tabs tablet Commonly known as: ELIQUIS Take 1 tablet (5 mg total) by mouth 2 (two) times daily.   atorvastatin 20 MG tablet Commonly known as: LIPITOR Take 1 tablet (20 mg total) by mouth daily.   DULoxetine 60 MG capsule Commonly known as: Cymbalta Take 1 capsule (60 mg total) by mouth daily.   fluticasone 50 MCG/ACT nasal spray Commonly known as: FLONASE Place 1 spray into both nostrils 2 (two) times daily as needed for allergies or rhinitis.   FreeStyle [place] 2 Reader  Devi Use as directed to test blood sugar up to 6 times daily as directed. DX: E 11.9   FreeStyle Libre 2 Sensor Misc Use as directed to test blood sugar up to 6 times daily as directed. DX: E 11.9   gabapentin 600 MG tablet Commonly known as: NEURONTIN Take 1 tablet (600 mg total) by mouth 3 (three) times daily.   HYDROcodone-acetaminophen 10-325 MG tablet Commonly known as: NORCO Take 1 tablet by mouth 2 (two) times daily as needed. What changed:  Another medication with the same name was changed. Make sure you understand how and when to take each. Changed by: [name], MD   HYDROcodone-acetaminophen 10-325 MG tablet Commonly known as: NORCO Take 1 tablet by mouth 2 (two) times daily as needed. Start taking on: August 24, 2022 What changed: These instructions start on August 24, 2022. If you are unsure what to do until then, ask your doctor or other care provider. Changed by: [name], MD   HYDROcodone-acetaminophen 10-325 MG tablet Commonly known as: NORCO Take 1 tablet by mouth 2 (two) times daily as needed. Start taking on: September 24, 2022 What changed: These instructions start on September 24, 2022. If you are unsure what to do until then, ask your doctor or other care provider. Changed by: [name] Murrel Bertram, MD   metFORMIN 1000 MG tablet Commonly known as: GLUCOPHAGE Take 1 tablet (1,000 mg total) by mouth 2 (two) times daily.   metoprolol succinate 25 MG 24 hr tablet Commonly known as: TOPROL-XL TAKE 1 TABLET BY MOUTH EVERY DAY   multivitamin with minerals tablet Take 1 tablet by mouth daily.   Semaglutide (1 MG/DOSE) 4 MG/3ML Sopn Inject 1 mg as directed once a week.   Tyler Aas FlexTouch 100 UNIT/ML FlexTouch Pen Generic drug: insulin degludec Inject 50-70 Units into the skin daily.         Objective:   BP 136/79   Pulse 89   Temp 98.3 F (36.8 C)   Ht 6\' 2"  (1.88 m)   Wt 198 lb (89.8 kg)   SpO2 97%   BMI 25.42 kg/m   Wt Readings from Last 3 Encounters:  07/26/22 198 lb (89.8 kg)  06/05/22 203 lb 12.8 oz (92.4 kg)  04/24/22 209 lb (94.8 kg)    Physical Exam Vitals and nursing note reviewed.  Constitutional:      General: He is not in acute distress.    Appearance: He is well-developed. He is not diaphoretic.  Eyes:     General: No scleral icterus.    Conjunctiva/sclera: Conjunctivae normal.  Neck:     Thyroid: No thyromegaly.  Cardiovascular:     Rate and Rhythm:  Normal rate and regular rhythm.     Heart sounds: Normal heart sounds. No murmur heard. Pulmonary:     Effort: Pulmonary effort is normal. No respiratory distress.     Breath sounds: Normal breath sounds. No wheezing.  Musculoskeletal:     Cervical back: Neck supple.     Comments: Status post bilateral BKA  Lymphadenopathy:     Cervical: No cervical adenopathy.  Skin:    General: Skin is warm and dry.     Findings: No rash.  Neurological:     Mental Status: He is alert and oriented to person, place, and time.     Coordination: Coordination normal.  Psychiatric:        Behavior: Behavior normal.     Results for orders placed or performed in visit on  04/25/22  HM DIABETES EYE EXAM  Result Value Ref Range   HM Diabetic Eye Exam No Retinopathy No Retinopathy    Assessment & Plan:   Problem List Items Addressed This Visit       Cardiovascular and Mediastinum   Essential hypertension, benign   Relevant Orders   Bayer DCA Hb A1c Waived   CBC with Differential/Platelet   Lipid panel     Endocrine   Neuropathy in diabetes (HCC)   Type 2 diabetes mellitus (HCC) - Primary   Relevant Orders   Bayer DCA Hb A1c Waived   CBC with Differential/Platelet   Lipid panel     Musculoskeletal and Integument   DDD (degenerative disc disease), lumbar   Relevant Medications   HYDROcodone-acetaminophen (NORCO) 10-325 MG tablet   HYDROcodone-acetaminophen (NORCO) 10-325 MG tablet (Start on 08/24/2022)   HYDROcodone-acetaminophen (NORCO) 10-325 MG tablet (Start on 09/24/2022)     Other   Pain management contract signed   Relevant Medications   HYDROcodone-acetaminophen (NORCO) 10-325 MG tablet   HYDROcodone-acetaminophen (NORCO) 10-325 MG tablet (Start on 08/24/2022)   HYDROcodone-acetaminophen (NORCO) 10-325 MG tablet (Start on 09/24/2022)   S/P BKA (below knee amputation) unilateral, left (HCC)   Relevant Medications   HYDROcodone-acetaminophen (NORCO) 10-325 MG tablet    HYDROcodone-acetaminophen (NORCO) 10-325 MG tablet (Start on 08/24/2022)   HYDROcodone-acetaminophen (NORCO) 10-325 MG tablet (Start on 09/24/2022)   Dyslipidemia   Relevant Orders   Bayer DCA Hb A1c Waived   CBC with Differential/Platelet   Lipid panel    A1c 8.2, continue medicine, he is going to increase the Ozempic to 2 mg.  He already has the medicine for it. Follow up plan: Return in about 3 months (around 10/25/2022), or if symptoms worsen or fail to improve, for Diabetes and pain management and hypertension.  Counseling provided for all of the vaccine components Orders Placed This Encounter  Procedures   Bayer DCA Hb A1c Waived   CBC with Differential/Platelet   Lipid panel    Arville Care, MD [facility] 07/26/2022, 11:27 AM

## 2022-07-27 ENCOUNTER — Ambulatory Visit: Payer: Medicare Other

## 2022-07-27 LAB — CBC WITH DIFFERENTIAL/PLATELET
Basophils Absolute: 0 10*3/uL (ref 0.0–0.2)
Basos: 1 %
EOS (ABSOLUTE): 0.1 10*3/uL (ref 0.0–0.4)
Eos: 1 %
Hematocrit: 47.6 % (ref 37.5–51.0)
Hemoglobin: 16.5 g/dL (ref 13.0–17.7)
Immature Grans (Abs): 0 10*3/uL (ref 0.0–0.1)
Immature Granulocytes: 0 %
Lymphocytes Absolute: 1.7 10*3/uL (ref 0.7–3.1)
Lymphs: 30 %
MCH: 29.2 pg (ref 26.6–33.0)
MCHC: 34.7 g/dL (ref 31.5–35.7)
MCV: 84 fL (ref 79–97)
Monocytes Absolute: 0.4 10*3/uL (ref 0.1–0.9)
Monocytes: 8 %
Neutrophils Absolute: 3.4 10*3/uL (ref 1.4–7.0)
Neutrophils: 60 %
Platelets: 182 10*3/uL (ref 150–450)
RBC: 5.65 x10E6/uL (ref 4.14–5.80)
RDW: 11.8 % (ref 11.6–15.4)
WBC: 5.6 10*3/uL (ref 3.4–10.8)

## 2022-07-27 LAB — LIPID PANEL
Chol/HDL Ratio: 2.7 ratio (ref 0.0–5.0)
Cholesterol, Total: 70 mg/dL — ABNORMAL LOW (ref 100–199)
HDL: 26 mg/dL — ABNORMAL LOW (ref >39)
LDL Chol Calc (NIH): 27 mg/dL (ref 0–99)
Triglycerides: 84 mg/dL (ref 0–149)
VLDL Cholesterol Cal: 17 mg/dL (ref 5–40)

## 2022-08-03 NOTE — Telephone Encounter (Signed)
Opened in error

## 2022-08-14 DIAGNOSIS — E1165 Type 2 diabetes mellitus with hyperglycemia: Secondary | ICD-10-CM | POA: Diagnosis not present

## 2022-08-16 ENCOUNTER — Telehealth: Payer: Self-pay | Admitting: Family Medicine

## 2022-08-16 NOTE — Telephone Encounter (Signed)
Pt informed that we do not have the 5mg . We do have 2.5mg  if he does not mind taking 2 tabs at a time. Pt informed and is fine to use the 2.5mg . He will call on a later date to see if we have the 5mg . Samples placed up front.

## 2022-08-16 NOTE — Telephone Encounter (Signed)
Patient calling to see if we have samples of eliquis. Please  call back and advise.

## 2022-08-25 ENCOUNTER — Telehealth: Payer: Self-pay | Admitting: Family Medicine

## 2022-08-25 MED ORDER — HYDROCODONE-ACETAMINOPHEN 5-325 MG PO TABS: 2.0000 | ORAL_TABLET | Freq: Two times a day (BID) | ORAL | 0 refills | Status: DC | PRN

## 2022-08-25 NOTE — Telephone Encounter (Signed)
Patient calling back and would like to speak to nurse. Please call back.

## 2022-08-25 NOTE — Telephone Encounter (Signed)
Pt has been informed and understood. 

## 2022-08-25 NOTE — Telephone Encounter (Signed)
Patient calling to let us know that HYDROcodone-acetaminophen (NORCO) 10-325 MG tablet is on backorder at all pharmacies that he has called. Would like to know what he needs to do. Please call back.

## 2022-08-25 NOTE — Telephone Encounter (Signed)
I sent the hydrocodone 5-3 25 enough for 1 month to replace his normal prescription.

## 2022-08-25 NOTE — Telephone Encounter (Signed)
[facility] 5-[facility]. Pt states that he is fine doubling that. He is worried that he will not get enough of the medication. Requesting to send in one months worth. [facility] said [facility] the 10-325 become available.

## 2022-08-25 NOTE — Telephone Encounter (Signed)
[facility], [facility] ([facility]), [facility] ( have to have all meds filled there)  Per Dr. Warrick Parisian pt needs to call pharmacy to see if they had different strengths.  Pt understood. States that [facility] will [facility] here [facility] [place] [facility].  Pt can try per [place]:  5-325 and double  Or 7.5 daily

## 2022-08-30 ENCOUNTER — Encounter: Payer: Self-pay | Admitting: Internal Medicine

## 2022-09-07 ENCOUNTER — Telehealth: Payer: Self-pay

## 2022-09-07 NOTE — Telephone Encounter (Signed)
Mailed Novo Nordisk renewal application to patient home.   Jovani Colquhoun L. CPhT [facility] Patient Advocate  

## 2022-09-14 DIAGNOSIS — E1165 Type 2 diabetes mellitus with hyperglycemia: Secondary | ICD-10-CM | POA: Diagnosis not present

## 2022-10-09 ENCOUNTER — Telehealth: Payer: Self-pay | Admitting: Family Medicine

## 2022-10-09 MED ORDER — HYDROCODONE-ACETAMINOPHEN 5-325 MG PO TABS: 2.0000 | ORAL_TABLET | Freq: Two times a day (BID) | ORAL | 0 refills | Status: DC | PRN

## 2022-10-09 NOTE — Telephone Encounter (Signed)
[facility] [place] [facility] HYDROcodone-acetaminophen (NORCO) 10-325 MG tablet. Patient needs a refill today 12/4, wants to know if HYDROcodone-acetaminophen (NORCO/VICODIN) 5-325 MG tablet can be called in instead. Please call back and advise

## 2022-10-09 NOTE — Telephone Encounter (Signed)
Sent in prescription for 5 mg hydrocodone.

## 2022-10-09 NOTE — Telephone Encounter (Signed)
Pt informed

## 2022-10-17 DIAGNOSIS — E1165 Type 2 diabetes mellitus with hyperglycemia: Secondary | ICD-10-CM | POA: Diagnosis not present

## 2022-10-18 ENCOUNTER — Telehealth: Payer: Self-pay | Admitting: Family Medicine

## 2022-10-18 NOTE — Telephone Encounter (Signed)
Give him a sample of the 0.5 mg, at least that he is taking that he is at least getting some which is better than nothing.  And then have a message sent to Raynelle Fanning to see what he can do going forward.

## 2022-10-18 NOTE — Telephone Encounter (Signed)
Pt informed and will come by tomorrow morning for sample. Will send message to [place].

## 2022-10-18 NOTE — Telephone Encounter (Signed)
Dr. Louanne Skye,  We only have samples of the 0.25 or 0.5mg . What should the pt do?

## 2022-10-25 ENCOUNTER — Encounter: Payer: Self-pay | Admitting: Family Medicine

## 2022-10-25 ENCOUNTER — Ambulatory Visit (INDEPENDENT_AMBULATORY_CARE_PROVIDER_SITE_OTHER): Payer: Medicare Other | Admitting: Family Medicine

## 2022-10-25 VITALS — BP 125/77 | HR 82 | Temp 97.4°F | Ht 74.0 in | Wt 211.0 lb

## 2022-10-25 DIAGNOSIS — I10 Essential (primary) hypertension: Secondary | ICD-10-CM | POA: Diagnosis not present

## 2022-10-25 DIAGNOSIS — E785 Hyperlipidemia, unspecified: Secondary | ICD-10-CM

## 2022-10-25 DIAGNOSIS — Z89512 Acquired absence of left leg below knee: Secondary | ICD-10-CM | POA: Diagnosis not present

## 2022-10-25 DIAGNOSIS — M5136 Other intervertebral disc degeneration, lumbar region: Secondary | ICD-10-CM

## 2022-10-25 DIAGNOSIS — Z0289 Encounter for other administrative examinations: Secondary | ICD-10-CM

## 2022-10-25 DIAGNOSIS — Z794 Long term (current) use of insulin: Secondary | ICD-10-CM | POA: Diagnosis not present

## 2022-10-25 DIAGNOSIS — E1122 Type 2 diabetes mellitus with diabetic chronic kidney disease: Secondary | ICD-10-CM

## 2022-10-25 DIAGNOSIS — E1142 Type 2 diabetes mellitus with diabetic polyneuropathy: Secondary | ICD-10-CM | POA: Diagnosis not present

## 2022-10-25 MED ORDER — HYDROCODONE-ACETAMINOPHEN 10-325 MG PO TABS: 1.0000 | ORAL_TABLET | Freq: Two times a day (BID) | ORAL | 0 refills | Status: DC | PRN

## 2022-10-25 MED ORDER — INSULIN LISPRO (1 UNIT DIAL) 100 UNIT/ML (KWIKPEN): 10.0000 [IU] | PEN_INJECTOR | Freq: Three times a day (TID) | SUBCUTANEOUS | 11 refills | Status: DC

## 2022-10-25 NOTE — Patient Instructions (Signed)
Added mealtime insulin, start with 10 units 3 times daily with meals but may need to go up to 20.  Check 1 hour after eating and if blood sugar is greater than 180 consistently after meals then let me know and we can increase the Humalog

## 2022-10-25 NOTE — Progress Notes (Signed)
BP 125/77   Pulse 82   Temp (!) 97.4 F (36.3 C)   Ht _0  (1.88 m)   Wt 211 lb (95.7 kg)   SpO2 97%   BMI 27.09 kg/m    Subjective:   Patient ID: [name], male    DOB: 1965-12-12, 56 y.o.   MRN: [id number]  HPI: [name] is a 55 y.o. male presenting on 10/25/2022 for Medical Management of Chronic Issues, Hypertension, and Diabetes   HPI Type 2 diabetes mellitus Patient comes in today for recheck of his diabetes. Patient has been currently taking Ozempic and Tresiba 72. Patient is not currently on an ACE inhibitor/ARB. Patient has seen an ophthalmologist this year. Patient denies any issues with his stumps, bilateral leg amputee. The symptom started onset as an adult for lipidemia and neuropathy ARE RELATED TO DM   Hypertension Patient is currently on metoprolol, and their blood pressure today is 125/77. Patient denies any lightheadedness or dizziness. Patient denies headaches, blurred vision, chest pains, shortness of breath, or weakness. Denies any side effects from medication and is content with current medication.   Hyperlipidemia Patient is coming in for recheck of his hyperlipidemia. The patient is currently taking Lipitor. They deny any issues with myalgias or history of liver damage from it. They deny any focal numbness or weakness or chest pain.   Pain assessment: Cause of pain-degenerative disc disease lumbar Pain location-bilateral lower back and legs Pain on scale of 1-10- 5 Frequency-daily What increases pain-being on his feet and trying to walk especially with his prosthetic legs What makes pain Better-medication Effects on ADL -limited some especially with amputations Any change in general medical condition-none  Current opioids rx-Norco 10-3 25 twice daily as needed # meds rx-60/month Effectiveness of current meds-works well Adverse reactions from pain meds-none Morphine equivalent-20  Pill count performed-No Last drug screen -01/20/2022 (  high risk q84m moderate risk q636mlow risk yearly ) Urine drug screen today- No Was the [facility]-yes  If yes were their any concerning findings? -Due to stock issues, he did get one of the prescriptions of the 5-325 earlier, this should get back on track  Pain contract signed on: 01/20/2022  Relevant past medical, surgical, family and social history reviewed and updated as indicated. Interim medical history since our last visit reviewed. Allergies and medications reviewed and updated.  Review of Systems  Constitutional:  Negative for chills and fever.  Respiratory:  Negative for shortness of breath and wheezing.   Cardiovascular:  Negative for chest pain and leg swelling.  Musculoskeletal:  Negative for back pain and gait problem.  Skin:  Negative for color change, rash and wound.  All other systems reviewed and are negative.   Per HPI unless specifically indicated above   Allergies as of 10/25/2022   No Known Allergies      Medication List        Accurate as of October 25, 2022 10:00 AM. If you have any questions, ask your nurse or doctor.          apixaban 5 MG Tabs tablet Commonly known as: ELIQUIS Take 1 tablet (5 mg total) by mouth 2 (two) times daily.   atorvastatin 20 MG tablet Commonly known as: LIPITOR Take 1 tablet (20 mg total) by mouth daily.   DULoxetine 60 MG capsule Commonly known as: Cymbalta Take 1 capsule (60 mg total) by mouth daily.   fluticasone 50 MCG/ACT nasal spray Commonly known as: FLONASE Place 1  spray into both nostrils 2 (two) times daily as needed for allergies or rhinitis.   FreeStyle [place] 2 Reader [place] Use as directed to test blood sugar up to 6 times daily as directed. DX: E 11.9   FreeStyle Libre 2 Sensor Misc Use as directed to test blood sugar up to 6 times daily as directed. DX: E 11.9   gabapentin 600 MG tablet Commonly known as: NEURONTIN Take 1 tablet (600 mg total) by mouth 3 (three) times daily.    HYDROcodone-acetaminophen 10-325 MG tablet Commonly known as: NORCO Take 1 tablet by mouth 2 (two) times daily as needed. What changed:  Another medication with the same name was changed. Make sure you understand how and when to take each. Another medication with the same name was removed. Continue taking this medication, and follow the directions you see here. Changed by: [name], MD   HYDROcodone-acetaminophen 10-325 MG tablet Commonly known as: NORCO Take 1 tablet by mouth 2 (two) times daily as needed. Start taking on: November 24, 2022 What changed:  These instructions start on November 24, 2022. If you are unsure what to do until then, ask your doctor or other care provider. Another medication with the same name was removed. Continue taking this medication, and follow the directions you see here. Changed by: [name], MD   HYDROcodone-acetaminophen 10-325 MG tablet Commonly known as: NORCO Take 1 tablet by mouth 2 (two) times daily as needed. Start taking on: December 24, 2022 What changed:  These instructions start on December 24, 2022. If you are unsure what to do until then, ask your doctor or other care provider. Another medication with the same name was removed. Continue taking this medication, and follow the directions you see here. Changed by: [name] Mclain Freer, MD   insulin lispro 100 UNIT/ML KwikPen Commonly known as: HumaLOG KwikPen Inject 10-20 Units into the skin 3 (three) times daily with meals. Started by: [name], MD   metFORMIN 1000 MG tablet Commonly known as: GLUCOPHAGE Take 1 tablet (1,000 mg total) by mouth 2 (two) times daily.   metoprolol succinate 25 MG 24 hr tablet Commonly known as: TOPROL-XL TAKE 1 TABLET BY MOUTH EVERY DAY   multivitamin with minerals tablet Take 1 tablet by mouth daily.   Semaglutide (1 MG/DOSE) 4 MG/3ML Sopn Inject 1 mg as directed once a week.   Tyler Aas FlexTouch 100 UNIT/ML FlexTouch  Pen Generic drug: insulin degludec Inject 50-70 Units into the skin daily.         Objective:   BP 125/77   Pulse 82   Temp (!) 97.4 F (36.3 C)   Ht _0  (1.88 m)   Wt 211 lb (95.7 kg)   SpO2 97%   BMI 27.09 kg/m   Wt Readings from Last 3 Encounters:  10/25/22 211 lb (95.7 kg)  07/26/22 198 lb (89.8 kg)  06/05/22 203 lb 12.8 oz (92.4 kg)    Physical Exam Vitals and nursing note reviewed.  Constitutional:      General: He is not in acute distress.    Appearance: He is well-developed. He is not diaphoretic.  Eyes:     General: No scleral icterus.    Conjunctiva/sclera: Conjunctivae normal.  Neck:     Thyroid: No thyromegaly.  Cardiovascular:     Rate and Rhythm: Normal rate and regular rhythm.     Heart sounds: Normal heart sounds. No murmur heard. Pulmonary:     Effort: Pulmonary effort is normal.  No respiratory distress.     Breath sounds: Normal breath sounds. No wheezing.  Musculoskeletal:        General: No swelling. Normal range of motion.     Cervical back: Neck supple.  Lymphadenopathy:     Cervical: No cervical adenopathy.  Skin:    General: Skin is warm and dry.     Findings: No rash.  Neurological:     Mental Status: He is alert and oriented to person, place, and time.     Coordination: Coordination normal.  Psychiatric:        Behavior: Behavior normal.       Assessment & Plan:   Problem List Items Addressed This Visit       Cardiovascular and Mediastinum   Essential hypertension, benign     Endocrine   Neuropathy in diabetes ([place])   Relevant Medications   insulin lispro (HUMALOG KWIKPEN) 100 UNIT/ML KwikPen   Type 2 diabetes mellitus (HCC) - Primary   Relevant Medications   insulin lispro (HUMALOG KWIKPEN) 100 UNIT/ML KwikPen   Other Relevant Orders   Bayer DCA Hb A1c Waived   CBC with Differential/Platelet   CMP14+EGFR   Lipid panel     Musculoskeletal and Integument   DDD (degenerative disc disease), lumbar   Relevant  Medications   HYDROcodone-acetaminophen (NORCO) 10-325 MG tablet (Start on 11/24/2022)   HYDROcodone-acetaminophen (NORCO) 10-325 MG tablet (Start on 12/24/2022)   HYDROcodone-acetaminophen (NORCO) 10-325 MG tablet     Other   Dyslipidemia   Pain management contract signed   Relevant Medications   HYDROcodone-acetaminophen (NORCO) 10-325 MG tablet (Start on 11/24/2022)   HYDROcodone-acetaminophen (NORCO) 10-325 MG tablet (Start on 12/24/2022)   HYDROcodone-acetaminophen (NORCO) 10-325 MG tablet   S/P BKA (below knee amputation) unilateral, left (HCC)   Relevant Medications   HYDROcodone-acetaminophen (NORCO) 10-325 MG tablet (Start on 11/24/2022)   HYDROcodone-acetaminophen (NORCO) 10-325 MG tablet (Start on 12/24/2022)   HYDROcodone-acetaminophen (NORCO) 10-325 MG tablet    A1c is 8.1, he is on Tresiba 72 and Ozempic 2 mg.  He does admit that he eats a lot of bread products and that is why it is more elevated.  Will start mealtime insulin to help control when he eats breads and carbohydrates. Follow up plan: Return in about 3 months (around 01/24/2023), or if symptoms worsen or fail to improve, for Diabetes recheck.  Counseling provided for all of the vaccine components Orders Placed This Encounter  Procedures   Bayer [place] Hb A1c Waived   CBC with Differential/Platelet   CMP14+EGFR   Lipid panel    Caryl Pina, MD [facility] 10/25/2022, 10:00 AM

## 2022-10-26 LAB — CBC WITH DIFFERENTIAL/PLATELET
Basophils Absolute: 0 10*3/uL (ref 0.0–0.2)
Basos: 1 %
EOS (ABSOLUTE): 0.1 10*3/uL (ref 0.0–0.4)
Eos: 1 %
Hematocrit: 49 % (ref 37.5–51.0)
Hemoglobin: 16.8 g/dL (ref 13.0–17.7)
Immature Grans (Abs): 0.1 10*3/uL (ref 0.0–0.1)
Immature Granulocytes: 1 %
Lymphocytes Absolute: 2.7 10*3/uL (ref 0.7–3.1)
Lymphs: 34 %
MCH: 31.1 pg (ref 26.6–33.0)
MCHC: 34.3 g/dL (ref 31.5–35.7)
MCV: 91 fL (ref 79–97)
Monocytes Absolute: 0.6 10*3/uL (ref 0.1–0.9)
Monocytes: 8 %
Neutrophils Absolute: 4.5 10*3/uL (ref 1.4–7.0)
Neutrophils: 55 %
Platelets: 213 10*3/uL (ref 150–450)
RBC: 5.41 x10E6/uL (ref 4.14–5.80)
RDW: 12.8 % (ref 11.6–15.4)
WBC: 8 10*3/uL (ref 3.4–10.8)

## 2022-10-26 LAB — CMP14+EGFR
ALT: 19 IU/L (ref 0–44)
AST: 16 IU/L (ref 0–40)
Albumin/Globulin Ratio: 1.9 (ref 1.2–2.2)
Albumin: 4.3 g/dL (ref 3.8–4.9)
Alkaline Phosphatase: 136 IU/L — ABNORMAL HIGH (ref 44–121)
BUN/Creatinine Ratio: 16 (ref 9–20)
BUN: 17 mg/dL (ref 6–24)
Bilirubin Total: 0.7 mg/dL (ref 0.0–1.2)
CO2: 27 mmol/L (ref 20–29)
Calcium: 9.3 mg/dL (ref 8.7–10.2)
Chloride: 102 mmol/L (ref 96–106)
Creatinine, Ser: 1.09 mg/dL (ref 0.76–1.27)
Globulin, Total: 2.3 g/dL (ref 1.5–4.5)
Glucose: 120 mg/dL — ABNORMAL HIGH (ref 70–99)
Potassium: 5.4 mmol/L — ABNORMAL HIGH (ref 3.5–5.2)
Sodium: 141 mmol/L (ref 134–144)
Total Protein: 6.6 g/dL (ref 6.0–8.5)
eGFR: 79 mL/min/{1.73_m2} (ref >59)

## 2022-10-26 LAB — LIPID PANEL
Chol/HDL Ratio: 2.7 ratio (ref 0.0–5.0)
Cholesterol, Total: 71 mg/dL — ABNORMAL LOW (ref 100–199)
HDL: 26 mg/dL — ABNORMAL LOW (ref >39)
LDL Chol Calc (NIH): 23 mg/dL (ref 0–99)
Triglycerides: 120 mg/dL (ref 0–149)
VLDL Cholesterol Cal: 22 mg/dL (ref 5–40)

## 2022-10-26 LAB — BAYER DCA HB A1C WAIVED: HB A1C (BAYER DCA - WAIVED): 8.1 % — ABNORMAL HIGH (ref 4.8–5.6)

## 2022-11-02 ENCOUNTER — Telehealth (INDEPENDENT_AMBULATORY_CARE_PROVIDER_SITE_OTHER): Payer: Medicare Other | Admitting: Family Medicine

## 2022-11-02 ENCOUNTER — Encounter: Payer: Self-pay | Admitting: Family Medicine

## 2022-11-02 DIAGNOSIS — L0201 Cutaneous abscess of face: Secondary | ICD-10-CM | POA: Diagnosis not present

## 2022-11-02 MED ORDER — SULFAMETHOXAZOLE-TRIMETHOPRIM 800-160 MG PO TABS: 1.0000 | ORAL_TABLET | Freq: Two times a day (BID) | ORAL | 0 refills | Status: AC

## 2022-11-02 NOTE — Progress Notes (Addendum)
Virtual Visit via MyChart Video Note Due to COVID-19 pandemic this visit was conducted virtually. This visit type was conducted due to national recommendations for restrictions regarding the COVID-19 Pandemic (e.g. social distancing, sheltering in place) in an effort to limit this patient's exposure and mitigate transmission in our community. All issues noted in this document were discussed and addressed.  A physical exam was not performed with this format.   I connected with [name] on 11/02/2022 at 1050 by MyChart Video and verified that I am speaking with the correct person using two identifiers. [name] is currently located at home and patient is currently with them during visit. The provider, Kari BaarsMichelle Neida Ellegood, FNP is located in their office at time of visit.  I discussed the limitations, risks, security and privacy concerns of performing an evaluation and management service by virtual visit and the availability of in person appointments. I also discussed with the patient that there may be a patient responsible charge related to this service. The patient expressed understanding and agreed to proceed.  Subjective:  Patient ID: [name], male    DOB: 12-14-1965, 56 y.o.   MRN: [id number]  Chief Complaint:  Abscess   HPI: [name] is a 56 y.o. male presenting on 11/02/2022 for Abscess   Pt reports a skin abscess to his chin. Has had similar in the past. States this one started 5 days ago. He has been using warm compresses without relief. Denies shaving recently. No fever, chills, weakness, or confusion. States the nodule is hard, red, and tender to touch. Has increased in size over the last 2 days. No drainage.      Relevant past medical, surgical, family, and social history reviewed and updated as indicated.  Allergies and medications reviewed and updated.   Past Medical History:  Diagnosis Date   Charcot ankle    Essential hypertension    Osteomyelitis of toe  of right foot (HCC)    Paroxysmal atrial fibrillation (HCC)    Recurrent boils    Type 2 diabetes mellitus with diabetic neuropathy [facility](HCC)    Diagnosed age 56    Past Surgical History:  Procedure Laterality Date   AMPUTATION Right 06/17/2014   Procedure: PARTIAL AMPUTATION RIGHT 3RD TOE;  Surgeon: Dallas SchimkeBenjamin Ivan McKinney, DPM;  Location: [facility];  Service: Podiatry;  Laterality: Right;   AMPUTATION Right 09/02/2014   Procedure: PARTIAL AMPUTATION 2ND TOE RIGHT FOOT;  Surgeon: Dallas SchimkeBenjamin Ivan McKinney, DPM;  Location: [facility];  Service: Podiatry;  Laterality: Right;   AMPUTATION Right 04/01/2015   Procedure: AMPUTATION DIGIT 1ST TOE RIGHT FOOT;  Surgeon: Laurell JosephsSherry Lu, DPM;  Location: [facility];  Service: Podiatry;  Laterality: Right;   AMPUTATION Right 06/29/2016   pinkeye    AMPUTATION Right 10/24/2019   Procedure: PARTIAL THIRD RAY AMPUTATION;  Surgeon: Ferman HammingMcKinney, Benjamin, DPM;  Location: [facility];  Service: Podiatry;  Laterality: Right;   AMPUTATION Right 02/19/2020   Procedure: AMPUTATION BELOW KNEE; RIGHT;  Surgeon: Franky MachoJenkins, Mark, MD;  Location: [facility];  Service: General;  Laterality: Right;   FOOT AMPUTATION Left    FOOT SURGERY Left 03/2017   HERNIA REPAIR Bilateral    and 3rd hernia repair does not remember ehat side   IRRIGATION AND DEBRIDEMENT ABSCESS Right 11/05/2019   Procedure: IRRIGATION AND DRAINAGE ABSCESS RIGHT ANKLE;  Surgeon: Ferman HammingMcKinney, Benjamin, DPM;  Location: [facility];  Service: Podiatry;  Laterality: Right;   TEE WITHOUT CARDIOVERSION N/A 10/29/2019   Procedure: TRANSESOPHAGEAL ECHOCARDIOGRAM (  TEE) WITH PROPOFOL;  Surgeon: Pricilla Riffle, MD;  [facility];  Service: Cardiovascular;  Laterality: N/A;    Social History   Socioeconomic History   Marital status: Significant Other    Spouse name: Not on file   Number of children: 1   Years of education: Not on file   Highest education level: Not on file  Occupational History   Occupation: Receiving  Tobacco Use    Smoking status: Never   Smokeless tobacco: Never  Vaping Use   Vaping Use: Never used  Substance and Sexual Activity   Alcohol use: No   Drug use: No   Sexual activity: Not on file  Other Topics Concern   Not on file  Social History Narrative   Has a significant other - lives alone - son lives in [place]   Social Determinants of Health   Financial Resource Strain: Medium Risk (07/26/2021)   Overall Financial Resource Strain (CARDIA)    Difficulty of Paying Living Expenses: Somewhat hard  Food Insecurity: Food Insecurity Present (07/26/2021)   Hunger Vital Sign    Worried About Running Out of Food in the Last Year: Sometimes true    Ran Out of Food in the Last Year: Never true  Transportation Needs: No Transportation Needs (07/26/2021)   PRAPARE - Administrator, Civil Service (Medical): No    Lack of Transportation (Non-Medical): No  Physical Activity: Sufficiently Active (07/26/2021)   Exercise Vital Sign    Days of Exercise per Week: 7 days    Minutes of Exercise per Session: 30 min  Stress: No Stress Concern Present (07/26/2021)   [employer] of Occupational Health - Occupational Stress Questionnaire    Feeling of Stress : Not at all  Social Connections: Moderately Integrated (07/26/2021)   Social Connection and Isolation Panel [NHANES]    Frequency of Communication with Friends and Family: More than three times a week    Frequency of Social Gatherings with Friends and Family: More than three times a week    Attends Religious Services: More than 4 times per year    Active Member of [employer] or Organizations: Yes    Attends Banker Meetings: 1 to 4 times per year    Marital Status: Divorced  Intimate Partner Violence: Not At Risk (07/26/2021)   Humiliation, Afraid, Rape, and Kick questionnaire    Fear of Current or Ex-Partner: No    Emotionally Abused: No    Physically Abused: No    Sexually Abused: No    Outpatient Encounter Medications as of  11/02/2022  Medication Sig   sulfamethoxazole-trimethoprim (BACTRIM DS) 800-160 MG tablet Take 1 tablet by mouth 2 (two) times daily for 10 days.   apixaban (ELIQUIS) 5 MG TABS tablet Take 1 tablet (5 mg total) by mouth 2 (two) times daily.   atorvastatin (LIPITOR) 20 MG tablet Take 1 tablet (20 mg total) by mouth daily.   Continuous Blood Gluc Receiver (FREESTYLE LIBRE 2 READER) DEVI Use as directed to test blood sugar up to 6 times daily as directed. DX: E 11.9   Continuous Blood Gluc Sensor (FREESTYLE LIBRE 2 SENSOR) MISC Use as directed to test blood sugar up to 6 times daily as directed. DX: E 11.9   DULoxetine (CYMBALTA) 60 MG capsule Take 1 capsule (60 mg total) by mouth daily.   fluticasone (FLONASE) 50 MCG/ACT nasal spray Place 1 spray into both nostrils 2 (two) times daily as needed for allergies or rhinitis.  gabapentin (NEURONTIN) 600 MG tablet Take 1 tablet (600 mg total) by mouth 3 (three) times daily.   [START ON 11/24/2022] HYDROcodone-acetaminophen (NORCO) 10-325 MG tablet Take 1 tablet by mouth 2 (two) times daily as needed.   [START ON 12/24/2022] HYDROcodone-acetaminophen (NORCO) 10-325 MG tablet Take 1 tablet by mouth 2 (two) times daily as needed.   HYDROcodone-acetaminophen (NORCO) 10-325 MG tablet Take 1 tablet by mouth 2 (two) times daily as needed.   insulin degludec (TRESIBA FLEXTOUCH) 100 UNIT/ML FlexTouch Pen Inject 50-70 Units into the skin daily.   insulin lispro (HUMALOG KWIKPEN) 100 UNIT/ML KwikPen Inject 10-20 Units into the skin 3 (three) times daily with meals.   metFORMIN (GLUCOPHAGE) 1000 MG tablet Take 1 tablet (1,000 mg total) by mouth 2 (two) times daily.   metoprolol succinate (TOPROL-XL) 25 MG 24 hr tablet TAKE 1 TABLET BY MOUTH EVERY DAY   Multiple Vitamins-Minerals (MULTIVITAMIN WITH MINERALS) tablet Take 1 tablet by mouth daily.   Semaglutide, 1 MG/DOSE, 4 MG/3ML SOPN Inject 1 mg as directed once a week.   No facility-administered encounter medications  on file as of 11/02/2022.    No Known Allergies  Review of Systems  Constitutional:  Negative for activity change, appetite change, chills, diaphoresis, fatigue, fever and unexpected weight change.  HENT: Negative.  Negative for sore throat, trouble swallowing and voice change.   Eyes: Negative.   Respiratory:  Negative for cough, chest tightness and shortness of breath.   Cardiovascular:  Negative for chest pain, palpitations and leg swelling.  Gastrointestinal:  Negative for abdominal pain, blood in stool, constipation, diarrhea, nausea and vomiting.  Endocrine: Negative.   Genitourinary:  Negative for decreased urine volume, difficulty urinating, dysuria, frequency and urgency.  Musculoskeletal:  Negative for arthralgias and myalgias.  Skin:  Positive for color change and wound. Negative for pallor and rash.  Allergic/Immunologic: Negative.   Neurological:  Negative for dizziness, tremors, seizures, syncope, facial asymmetry, speech difficulty, weakness, light-headedness, numbness and headaches.  Hematological: Negative.   Psychiatric/Behavioral:  Negative for agitation, confusion, hallucinations, sleep disturbance and suicidal ideas.   All other systems reviewed and are negative.        Observations/Objective: No vital signs or physical exam, this was a virtual health encounter.  Pt alert and oriented, answers all questions appropriately, and able to speak in full sentences.  Approximate 2 cm red and raised nodule under chin with surrounding erythema. No drainage noted.   Assessment and Plan: Robey was seen today for abscess.  Diagnoses and all orders for this visit:  Abscess of chin Abscess under chin. Has had similar in the past and those culture results have been reviewed, will base medications off of previous culture report. Will initiate below. No indications of systemic infection. Medications as prescribed along with warm compresses. Pt aware to report new, worsening,  or persistent symptoms. Follow up if not improving.  -     sulfamethoxazole-trimethoprim (BACTRIM DS) 800-160 MG tablet; Take 1 tablet by mouth 2 (two) times daily for 10 days.     Follow Up Instructions: Return if symptoms worsen or fail to improve.    I discussed the assessment and treatment plan with the patient. The patient was provided an opportunity to ask questions and all were answered. The patient agreed with the plan and demonstrated an understanding of the instructions.   The patient was advised to call back or seek an in-person evaluation if the symptoms worsen or if the condition fails to improve as anticipated.  The above assessment and management plan was discussed with the patient. The patient verbalized understanding of and has agreed to the management plan. Patient is aware to call the clinic if they develop any new symptoms or if symptoms persist or worsen. Patient is aware when to return to the clinic for a follow-up visit. Patient educated on when it is appropriate to go to the emergency department.    I provided 16 minutes of time during this MyChart Video encounter.   Kari Baars, FNP-C [facility] [address] [place], [place] [address] [phone] 11/02/2022

## 2022-11-03 ENCOUNTER — Telehealth: Payer: Self-pay | Admitting: Pharmacist

## 2022-11-03 NOTE — Telephone Encounter (Signed)
Can you please add novolog to patient's novo nordisk patient assistance meds?  He should already be enrolled in novo PAP for ozempic/tresiba  Sig: 20 units 3 times daily with meals (max 60 u/day)  Thanks!

## 2022-11-14 ENCOUNTER — Telehealth: Payer: Self-pay | Admitting: Family Medicine

## 2022-11-15 NOTE — Telephone Encounter (Signed)
Pt calling again today about this message.

## 2022-11-15 NOTE — Telephone Encounter (Addendum)
Pt advised that we do not have samples at this time. Pt would like Almyra Free to be aware and to also schedule an appt with her.  Appt scheduled for 1/16 at 2

## 2022-11-16 ENCOUNTER — Telehealth: Payer: Self-pay | Admitting: Cardiology

## 2022-11-16 MED ORDER — APIXABAN 5 MG PO TABS: 5.0000 mg | ORAL_TABLET | Freq: Two times a day (BID) | ORAL | 6 refills | Status: DC

## 2022-11-16 MED ORDER — APIXABAN 5 MG PO TABS: 5.0000 mg | ORAL_TABLET | Freq: Two times a day (BID) | ORAL | 0 refills | Status: AC

## 2022-11-16 NOTE — Telephone Encounter (Signed)
Patient calling the office for samples of medication:   1.  What medication and dosage are you requesting samples for?   apixaban (ELIQUIS) 5 MG TABS tablet    2.  Are you currently out of this medication? Not yet, pt has about 4-5 days left

## 2022-11-16 NOTE — Telephone Encounter (Signed)
Advised that eliquis samples are available for pick up along with BMS-PAF that she is to fill out all patient sections and bring back to office with proof of income and prescription out of pocket expense for 2024. Verbalized understanding.

## 2022-11-17 ENCOUNTER — Telehealth: Payer: Self-pay

## 2022-11-17 NOTE — Telephone Encounter (Signed)
Received notification from [place] regarding RE-ENROLLMENT approval for [place]. Patient assistance approved from 11/16/22 to 11/06/23.  MEDICATION WILL SHIP TO OFFICE  Phone: [phone]

## 2022-11-17 NOTE — Telephone Encounter (Signed)
Patient informed that two boxes of Ozempic have been received and will be placed in the refrigerator for him to pick up.

## 2022-11-21 ENCOUNTER — Ambulatory Visit (INDEPENDENT_AMBULATORY_CARE_PROVIDER_SITE_OTHER): Payer: Medicare Other | Admitting: Pharmacist

## 2022-11-21 ENCOUNTER — Encounter: Payer: Self-pay | Admitting: Pharmacist

## 2022-11-21 ENCOUNTER — Ambulatory Visit: Payer: Medicare Other | Admitting: Nurse Practitioner

## 2022-11-21 VITALS — BP 130/75

## 2022-11-21 DIAGNOSIS — Z794 Long term (current) use of insulin: Secondary | ICD-10-CM

## 2022-11-21 DIAGNOSIS — E1122 Type 2 diabetes mellitus with diabetic chronic kidney disease: Secondary | ICD-10-CM

## 2022-11-21 MED ORDER — NOVOLOG FLEXPEN 100 UNIT/ML ~~LOC~~ SOPN: 5.0000 [IU] | PEN_INJECTOR | Freq: Three times a day (TID) | SUBCUTANEOUS | 11 refills | Status: DC

## 2022-11-21 MED ORDER — SEMAGLUTIDE (2 MG/DOSE) 8 MG/3ML ~~LOC~~ SOPN: 2.0000 mg | PEN_INJECTOR | SUBCUTANEOUS | 5 refills | Status: DC

## 2022-11-21 NOTE — Progress Notes (Signed)
    11/21/2022 Name: [name] MRN: [id number] DOB: [birth year]   S:  66yoM Presents for diabetes evaluation, education, and management Patient was referred and last seen by Primary Care Provider on 10/25/22.  Insurance coverage/medication affordability: UHC  Patient reports adherence with medications. Current diabetes medications include: tresiba, ozempic, metformin Current hypertension medications include: metoprolol Goal 130/80 Current hyperlipidemia medications include: atorvastatin   Patient denies hypoglycemic events.   Patient reported dietary habits: Eats 3 meals/day  Patient-reported exercise habits: encouraged as able   O:  Lab Results  Component Value Date   HGBA1C 8.1 (H) 10/25/2022   Lipid Panel     Component Value Date/Time   CHOL 71 (L) 10/25/2022 0927   TRIG 120 10/25/2022 0927   HDL 26 (L) 10/25/2022 0927   CHOLHDL 2.7 10/25/2022 0927   CHOLHDL 4.6 01/30/2018 1347   VLDL 54 (H) 01/30/2018 1347   [place] 23 10/25/2022 0927     Home fasting blood sugars: <145  2 hour post-meal/random blood sugars: n/a    Clinical Atherosclerotic Cardiovascular Disease (ASCVD): No   The ASCVD Risk score (Arnett DK, et al., 2019) failed to calculate for the following reasons:   The valid total cholesterol range is 130 to 320 mg/dL    A/P:  Uncontrolled (A1C 8.1%)  Current treatment:  TRESIBA 72-74 UNITS DAILY, INCREASE OZEMPIC TO 2MG  SQ WEEKLY ADDING MEAL TIME COVERAGE TO DINNER AS NEEDED (NOVOLOG TO COME IN ON PATIENT ASSISTANCE CONTINUE METFORMIN  -Extensively discussed pathophysiology of diabetes, recommended lifestyle interventions, dietary effects on blood sugar control  -Counseled on s/sx of and management of hypoglycemia  -Next A1C anticipated 3 months.  Written patient instructions provided.  Total time in face to face counseling 25 minutes.     Regina Eck, PharmD, BCPS, BCACP Clinical Pharmacist, [place]  [facility]  T [phone]

## 2022-11-21 NOTE — Progress Notes (Deleted)
Cardiology Office Note:    Date:  11/21/2022   ID:  [name], DOB 13-Jul-1966, MRN [id number]  PCP:  Dettinger, [name], MD   [place] Prov[facility] Cardiologist:  Rozann Lesches, MD { Click to update primary MD,subspecialty MD or APP then REFRESH:1}    Referring MD: Dettinger, [name], MD   No chief complaint on file. ***  History of Present Illness:    [name] is a 57 y.o. male with a hx of ***  PAF Hypertension Type 2 diabetes Osteomyelitis  Was admitted to [facility] in 2020 for sepsis of right foot infection with cellulitis and osteomyelitis, ultimately required amputation.  Cardiology was consulted as he was found to be in A-fib with RVR, received Cardizem and IV amiodarone.  Converted back to normal sinus rhythm undergoing DCCV.  Started on Eliquis for anticoagulation as he was given new diagnosis of right popliteal and peroneal DVT during admission.  TEE was negative for any evidence of vegetations.  Patient is a 57 year old male with past medical history as mentioned above.  Last seen by Dr. Domenic Polite on February 03, 2022.  Patient was overall doing very well.  Denied any palpitations.  Tolerating medications well.  Was provided samples of Eliquis.  No changes in medication therapy were made.  Was told to follow-up in 6 months.  Today he presents for 52-monthfollow-up.  He states.  Past Medical History:  Diagnosis Date   Charcot ankle    Essential hypertension    Osteomyelitis of toe of right foot (HCC)    Paroxysmal atrial fibrillation (HCC)    Recurrent boils    Type 2 diabetes mellitus with diabetic neuropathy ([facility]    Diagnosed age [age]   Past Surgical History:  Procedure Laterality Date   AMPUTATION Right 06/17/2014   Procedure: PARTIAL AMPUTATION RIGHT 3RD TOE;  Surgeon: BMarcheta Grammes DPM;  Location: [facility];  Service: Podiatry;  Laterality: Right;   AMPUTATION Right 09/02/2014   Procedure: PARTIAL AMPUTATION 2ND TOE RIGHT  FOOT;  Surgeon: BMarcheta Grammes DPM;  Location: [facility];  Service: Podiatry;  Laterality: Right;   AMPUTATION Right 04/01/2015   Procedure: AMPUTATION DIGIT 1ST TOE RIGHT FOOT;  Surgeon: SJuanita Laster DPM;  Location: [facility];  Service: Podiatry;  Laterality: Right;   AMPUTATION Right 06/29/2016   pinkeye    AMPUTATION Right 10/24/2019   Procedure: PARTIAL THIRD RAY AMPUTATION;  Surgeon: MCaprice Beaver DPM;  Location: [facility];  Service: Podiatry;  Laterality: Right;   AMPUTATION Right 02/19/2020   Procedure: AMPUTATION BELOW KNEE; RIGHT;  Surgeon: JAviva Signs MD;  Location: [facility];  Service: General;  Laterality: Right;   FOOT AMPUTATION Left    FOOT SURGERY Left 03/2017   HERNIA REPAIR Bilateral    and 3rd hernia repair does not remember ehat side   IRRIGATION AND DEBRIDEMENT ABSCESS Right 11/05/2019   Procedure: IRRIGATION AND DRAINAGE ABSCESS RIGHT ANKLE;  Surgeon: MCaprice Beaver DPM;  Location: [facility];  Service: Podiatry;  Laterality: Right;   TEE WITHOUT CARDIOVERSION N/A 10/29/2019   Procedure: TRANSESOPHAGEAL ECHOCARDIOGRAM (TEE) WITH PROPOFOL;  Surgeon: RFay Records MD;  Location[facility];  Service: Cardiovascular;  Laterality: N/A;    Current Medications: No outpatient medications have been marked as taking for the 11/21/22 encounter (Appointment) with PFinis Bud NP.     Allergies:   Patient has no known allergies.   Social History   Socioeconomic History   Marital status: Significant Other  Spouse name: Not on file   Number of children: 1   Years of education: Not on file   Highest education level: Not on file  Occupational History   Occupation: Receiving  Tobacco Use   Smoking status: Never   Smokeless tobacco: Never  Vaping Use   Vaping Use: Never used  Substance and Sexual Activity   Alcohol use: No   Drug use: No   Sexual activity: Not on file  Other Topics Concern   Not on file  Social History Narrative   Has a significant other  - lives alone - son lives in [place] Determinants of Health   Financial Resource Strain: Medium Risk (07/26/2021)   Overall Financial Resource Strain ([place])    Difficulty of Paying Living Expenses: Somewhat hard  Food Insecurity: Food Insecurity Present (07/26/2021)   Hunger Vital Sign    Worried About [place] in the Last Year: Sometimes true    Ran Out of Food in the Last Year: Never true  Transportation Needs: No Transportation Needs (07/26/2021)   PRAPARE - Hydrologist (Medical): No    Lack of Transportation (Non-Medical): No  Physical Activity: Sufficiently Active (07/26/2021)   Exercise Vital Sign    Days of Exercise per Week: 7 days    Minutes of Exercise per Session: 30 min  Stress: No Stress Concern Present (07/26/2021)   [place]    Feeling of Stress : Not at all  Social Connections: Moderately Integrated (07/26/2021)   Social Connection and Isolation Panel [NHANES]    Frequency of Communication with Friends and Family: More than three times a week    Frequency of Social Gatherings with Friends and Family: More than three times a week    Attends Religious Services: More than 4 times per year    Active Member of [employer] or Organizations: Yes    Attends Archivist Meetings: 1 to 4 times per year    Marital Status: Divorced     Family History: The patient's ***family history includes Aneurysm in his father; Asthma in his father; COPD in his father; Diabetes in his brother and mother; Stroke in his brother and father. There is no history of Colon cancer, Heart disease, Rectal cancer, or Stomach cancer.  ROS:   Please see the history of present illness.    *** All other systems reviewed and are negative.  EKGs/Labs/Other Studies Reviewed:    The following studies were reviewed today: ***  EKG:  EKG is *** ordered today.  The ekg ordered today  demonstrates ***  Recent Labs: 10/25/2022: ALT 19; BUN 17; Creatinine, Ser 1.09; Hemoglobin 16.8; Platelets 213; Potassium 5.4; Sodium 141  Recent Lipid Panel    Component Value Date/Time   CHOL 71 (L) 10/25/2022 0927   TRIG 120 10/25/2022 0927   HDL 26 (L) 10/25/2022 0927   CHOLHDL 2.7 10/25/2022 0927   CHOLHDL 4.6 01/30/2018 1347   VLDL 54 (H) 01/30/2018 1347   LDLCALC 23 10/25/2022 0927     Risk Assessment/Calculations:   {Does this patient have ATRIAL FIBRILLATION?:[phone]}  No BP recorded.  {Refresh Note OR Click here to enter BP  :1}***         Physical Exam:    VS:  There were no vitals taken for this visit.    Wt Readings from Last 3 Encounters:  10/25/22 211 lb (95.7 kg)  07/26/22 198 lb (  89.8 kg)  06/05/22 203 lb 12.8 oz (92.4 kg)     GEN: *** Well nourished, well developed in no acute distress HEENT: Normal NECK: No JVD; No carotid bruits LYMPHATICS: No lymphadenopathy CARDIAC: ***RRR, no murmurs, rubs, gallops RESPIRATORY:  Clear to auscultation without rales, wheezing or rhonchi  ABDOMEN: Soft, non-tender, non-distended MUSCULOSKELETAL:  No edema; No deformity  SKIN: Warm and dry NEUROLOGIC:  Alert and oriented x 3 PSYCHIATRIC:  Normal affect   ASSESSMENT:    No diagnosis found. PLAN:    In order of problems listed above:  ***      {Are you ordering a CV Procedure (e.g. stress test, cath, DCCV, TEE, etc)?   Press F2        :[id number]    Medication Adjustments/Labs and Tests Ordered: Current medicines are reviewed at length with the patient today.  Concerns regarding medicines are outlined above.  No orders of the defined types were placed in this encounter.  No orders of the defined types were placed in this encounter.   There are no Patient Instructions on file for this visit.   Signed, Finis Bud, NP  11/21/2022 8:20 AM    Jud

## 2022-11-27 ENCOUNTER — Telehealth: Payer: Self-pay | Admitting: Family Medicine

## 2022-11-27 NOTE — Telephone Encounter (Signed)
Tried calling pt on both numbers. He did not answer and vmail is full on cell and home vmail is not set up.  Per Dr. Warrick Parisian he can have sample of Tresiba. It is up front in [employer] fridge with his name on it.  Pt needs to keep juice, glucagon tabs with him and keep a close eye on his sugars.

## 2022-11-27 NOTE — Telephone Encounter (Signed)
Spoke to pt. He is doing fine and keeping an eye on his blood sugars. He will come by tomorrow and pick up [place] samples.

## 2022-11-27 NOTE — Telephone Encounter (Signed)
Pt says he was taking Tresiba 100 units but ran out of that so he started taking Novolog 100 units. Wants to make sure that's ok.

## 2022-11-27 NOTE — Telephone Encounter (Signed)
No that is not okay, NovoLog is short acting insulin and [name] is long-acting insulin, they are not the same thing, taking that much NovoLog will likely bottom him out so he needs to watch his blood sugars very closely in the next hour and make sure he does not bottom out.  We have a sample for [name] so please have him come in and pick it up.  Changing of insulin on his own is not a good idea to do.

## 2022-11-27 NOTE — Telephone Encounter (Signed)
Patient is aware that we have samples, he will come by tomorrow 1/23 to pick up.

## 2022-11-28 ENCOUNTER — Ambulatory Visit: Payer: Medicare Other | Admitting: Nurse Practitioner

## 2022-12-10 ENCOUNTER — Other Ambulatory Visit: Payer: Self-pay | Admitting: Family Medicine

## 2022-12-10 DIAGNOSIS — E785 Hyperlipidemia, unspecified: Secondary | ICD-10-CM

## 2022-12-12 ENCOUNTER — Ambulatory Visit: Payer: Medicare Other | Admitting: Nurse Practitioner

## 2022-12-12 ENCOUNTER — Telehealth (INDEPENDENT_AMBULATORY_CARE_PROVIDER_SITE_OTHER): Payer: Medicare Other | Admitting: Family Medicine

## 2022-12-12 ENCOUNTER — Encounter: Payer: Self-pay | Admitting: Family Medicine

## 2022-12-12 DIAGNOSIS — J069 Acute upper respiratory infection, unspecified: Secondary | ICD-10-CM

## 2022-12-12 MED ORDER — BENZONATATE 100 MG PO CAPS: 100.0000 mg | ORAL_CAPSULE | Freq: Three times a day (TID) | ORAL | 0 refills | Status: DC | PRN

## 2022-12-12 MED ORDER — GUAIFENESIN ER 600 MG PO TB12: 600.0000 mg | ORAL_TABLET | Freq: Two times a day (BID) | ORAL | 0 refills | Status: AC

## 2022-12-12 NOTE — Progress Notes (Signed)
Virtual Visit via MyChart Video Note Due to COVID-19 pandemic this visit was conducted virtually. This visit type was conducted due to national recommendations for restrictions regarding the COVID-19 Pandemic (e.g. social distancing, sheltering in place) in an effort to limit this patient's exposure and mitigate transmission in our community. All issues noted in this document were discussed and addressed.  A physical exam was not performed with this format.   I connected with [name] on 12/12/2022 at 1245 by MyChart Video and verified that I am speaking with the correct person using two identifiers. [name] is currently located at home and patient is currently with them during visit. The provider, Kari Baars, FNP is located in their office at time of visit.  I discussed the limitations, risks, security and privacy concerns of performing an evaluation and management service by virtual visit and the availability of in person appointments. I also discussed with the patient that there may be a patient responsible charge related to this service. The patient expressed understanding and agreed to proceed.  Subjective:  Patient ID: [name], male    DOB: 1965/11/18, 57 y.o.   MRN: [id number]  Chief Complaint:  Sinus Problem   HPI: [name] is a 57 y.o. male presenting on 12/12/2022 for Sinus Problem   Sinus Problem This is a new problem. Episode onset: Sunday. The problem is unchanged. The pain is moderate. Associated symptoms include chills, congestion, coughing, headaches and sinus pressure. Pertinent negatives include no diaphoresis, ear pain, hoarse voice, neck pain, shortness of breath, sneezing, sore throat or swollen glands. Past treatments include oral decongestants and nasal decongestants. The treatment provided no relief.     Relevant past medical, surgical, family, and social history reviewed and updated as indicated.  Allergies and medications reviewed and  updated.   Past Medical History:  Diagnosis Date   Charcot ankle    Essential hypertension    Osteomyelitis of toe of right foot (HCC)    Paroxysmal atrial fibrillation (HCC)    Recurrent boils    Type 2 diabetes mellitus with diabetic neuropathy [facility])    Diagnosed age 5    Past Surgical History:  Procedure Laterality Date   AMPUTATION Right 06/17/2014   Procedure: PARTIAL AMPUTATION RIGHT 3RD TOE;  Surgeon: [name], DPM;  Location: [facility];  Service: Podiatry;  Laterality: Right;   AMPUTATION Right 09/02/2014   Procedure: PARTIAL AMPUTATION 2ND TOE RIGHT FOOT;  Surgeon: [name], DPM;  Location: [facility];  Service: Podiatry;  Laterality: Right;   AMPUTATION Right 04/01/2015   Procedure: AMPUTATION DIGIT 1ST TOE RIGHT FOOT;  Surgeon: Laurell Josephs, DPM;  Location: [facility];  Service: Podiatry;  Laterality: Right;   AMPUTATION Right 06/29/2016   pinkeye    AMPUTATION Right 10/24/2019   Procedure: PARTIAL THIRD RAY AMPUTATION;  Surgeon: Ferman Hamming, DPM;  Location: [facility];  Service: Podiatry;  Laterality: Right;   AMPUTATION Right 02/19/2020   Procedure: AMPUTATION BELOW KNEE; RIGHT;  Surgeon: [name], MD;  Location: [facility];  Service: General;  Laterality: Right;   FOOT AMPUTATION Left    FOOT SURGERY Left 03/2017   HERNIA REPAIR Bilateral    and 3rd hernia repair does not remember ehat side   IRRIGATION AND DEBRIDEMENT ABSCESS Right 11/05/2019   Procedure: IRRIGATION AND DRAINAGE ABSCESS RIGHT ANKLE;  Surgeon: Ferman Hamming, DPM;  Location: [facility];  Service: Podiatry;  Laterality: Right;   TEE WITHOUT CARDIOVERSION N/A 10/29/2019  Procedure: TRANSESOPHAGEAL ECHOCARDIOGRAM (TEE) WITH PROPOFOL;  Surgeon: Fay Records, MD;  Location: [facility];  Service: Cardiovascular;  Laterality: N/A;    Social History   Socioeconomic History   Marital status: Significant Other    Spouse name: Not on file   Number of children: 1   Years of  education: Not on file   Highest education level: Not on file  Occupational History   Occupation: Receiving  Tobacco Use   Smoking status: Never   Smokeless tobacco: Never  Vaping Use   Vaping Use: Never used  Substance and Sexual Activity   Alcohol use: No   Drug use: No   Sexual activity: Not on file  Other Topics Concern   Not on file  Social History Narrative   Has a significant other - lives alone - son lives in [place] Determinants of Health   Financial Resource Strain: Medium Risk (07/26/2021)   Overall Financial Resource Strain (CARDIA)    Difficulty of Paying Living Expenses: Somewhat hard  Food Insecurity: Food Insecurity Present (07/26/2021)   Hunger Vital Sign    Worried About [place] in the Last Year: Sometimes true    [place] in the Last Year: Never true  Transportation Needs: No Transportation Needs (07/26/2021)   PRAPARE - Hydrologist (Medical): No    Lack of Transportation (Non-Medical): No  Physical Activity: Sufficiently Active (07/26/2021)   Exercise Vital Sign    Days of Exercise per Week: 7 days    Minutes of Exercise per Session: 30 min  Stress: No Stress Concern Present (07/26/2021)   [place]    Feeling of Stress : Not at all  Social Connections: Moderately Integrated (07/26/2021)   Social Connection and Isolation Panel [NHANES]    Frequency of Communication with Friends and Family: More than three times a week    Frequency of Social Gatherings with Friends and Family: More than three times a week    Attends Religious Services: More than 4 times per year    Active Member of [employer] or Organizations: Yes    Attends Archivist Meetings: 1 to 4 times per year    Marital Status: Divorced  Intimate Partner Violence: Not At Risk (07/26/2021)   Humiliation, Afraid, Rape, and Kick questionnaire    Fear of Current or Ex-Partner:  No    Emotionally Abused: No    Physically Abused: No    Sexually Abused: No    Outpatient Encounter Medications as of 12/12/2022  Medication Sig   benzonatate (TESSALON PERLES) 100 MG capsule Take 1 capsule (100 mg total) by mouth 3 (three) times daily as needed for cough.   guaiFENesin (MUCINEX) 600 MG 12 hr tablet Take 1 tablet (600 mg total) by mouth 2 (two) times daily for 10 days.   apixaban (ELIQUIS) 5 MG TABS tablet Take 1 tablet (5 mg total) by mouth 2 (two) times daily.   atorvastatin (LIPITOR) 20 MG tablet TAKE 1 TABLET BY MOUTH EVERY DAY   Continuous Blood Gluc Receiver (FREESTYLE LIBRE 2 READER) DEVI Use as directed to test blood sugar up to 6 times daily as directed. DX: E 11.9   Continuous Blood Gluc Sensor (FREESTYLE LIBRE 2 SENSOR) MISC Use as directed to test blood sugar up to 6 times daily as directed. DX: E 11.9   DULoxetine (CYMBALTA) 60 MG capsule Take 1 capsule (60  mg total) by mouth daily.   fluticasone (FLONASE) 50 MCG/ACT nasal spray Place 1 spray into both nostrils 2 (two) times daily as needed for allergies or rhinitis.   gabapentin (NEURONTIN) 600 MG tablet Take 1 tablet (600 mg total) by mouth 3 (three) times daily.   HYDROcodone-acetaminophen (NORCO) 10-325 MG tablet Take 1 tablet by mouth 2 (two) times daily as needed.   [START ON 12/24/2022] HYDROcodone-acetaminophen (NORCO) 10-325 MG tablet Take 1 tablet by mouth 2 (two) times daily as needed.   HYDROcodone-acetaminophen (NORCO) 10-325 MG tablet Take 1 tablet by mouth 2 (two) times daily as needed.   insulin aspart (NOVOLOG FLEXPEN) 100 UNIT/ML FlexPen Inject 5-10 Units into the skin 3 (three) times daily with meals.   insulin degludec (TRESIBA FLEXTOUCH) 100 UNIT/ML FlexTouch Pen Inject 72-74 Units into the skin daily.   metFORMIN (GLUCOPHAGE) 1000 MG tablet Take 1 tablet (1,000 mg total) by mouth 2 (two) times daily.   metoprolol succinate (TOPROL-XL) 25 MG 24 hr tablet TAKE 1 TABLET BY MOUTH EVERY DAY    Multiple Vitamins-Minerals (MULTIVITAMIN WITH MINERALS) tablet Take 1 tablet by mouth daily.   Semaglutide, 2 MG/DOSE, 8 MG/3ML SOPN Inject 2 mg as directed once a week.   No facility-administered encounter medications on file as of 12/12/2022.    No Known Allergies  Review of Systems  Constitutional:  Positive for chills. Negative for activity change, appetite change, diaphoresis, fatigue, fever and unexpected weight change.  HENT:  Positive for congestion, postnasal drip, rhinorrhea, sinus pressure and sinus pain. Negative for dental problem, drooling, ear discharge, ear pain, facial swelling, hearing loss, hoarse voice, mouth sores, nosebleeds, sneezing, sore throat, trouble swallowing and voice change.   Respiratory:  Positive for cough. Negative for apnea, choking, chest tightness, shortness of breath, wheezing and stridor.   Cardiovascular:  Negative for chest pain, palpitations and leg swelling.  Genitourinary:  Negative for decreased urine volume and difficulty urinating.  Musculoskeletal:  Negative for myalgias and neck pain.  Neurological:  Positive for headaches. Negative for dizziness, tremors, seizures, syncope, facial asymmetry, speech difficulty, weakness, light-headedness and numbness.  Psychiatric/Behavioral:  Negative for confusion.   All other systems reviewed and are negative.        Observations/Objective: No vital signs or physical exam, this was a virtual health encounter.  Pt alert and oriented, answers all questions appropriately, and able to speak in full sentences.    Assessment and Plan: [name] was seen today for sinus problem.  Diagnoses and all orders for this visit:  URI with cough and congestion No indications of acute bacterial illness. COVID negative at home. Will add below to treatment regimen. Aware to report new, worsening, or persistent symptoms. Follow up if not improving.  -     guaiFENesin (MUCINEX) 600 MG 12 hr tablet; Take 1 tablet (600 mg  total) by mouth 2 (two) times daily for 10 days. -     benzonatate (TESSALON PERLES) 100 MG capsule; Take 1 capsule (100 mg total) by mouth 3 (three) times daily as needed for cough.     Follow Up Instructions: Return if symptoms worsen or fail to improve.    I discussed the assessment and treatment plan with the patient. The patient was provided an opportunity to ask questions and all were answered. The patient agreed with the plan and demonstrated an understanding of the instructions.   The patient was advised to call back or seek an in-person evaluation if the symptoms worsen or if the condition  fails to improve as anticipated.  The above assessment and management plan was discussed with the patient. The patient verbalized understanding of and has agreed to the management plan. Patient is aware to call the clinic if they develop any new symptoms or if symptoms persist or worsen. Patient is aware when to return to the clinic for a follow-up visit. Patient educated on when it is appropriate to go to the emergency department.    I provided 15 minutes of time during this MyChart Video encounter.   Monia Pouch, FNP-C [facility] [address] [place], [place] [address] [phone] 12/12/2022

## 2022-12-15 ENCOUNTER — Telehealth: Payer: Self-pay | Admitting: Family Medicine

## 2022-12-15 MED ORDER — HYDROCODONE-ACETAMINOPHEN 7.5-325 MG PO TABS: 1.0000 | ORAL_TABLET | Freq: Two times a day (BID) | ORAL | 0 refills | Status: DC | PRN

## 2022-12-15 NOTE — Telephone Encounter (Signed)
Okay sounds good

## 2022-12-15 NOTE — Telephone Encounter (Signed)
I sent in that dose for him that is in stock, will be lower so it may not be as effective but it is better than not having any and all.  Hopefully this only last 1 month.

## 2022-12-15 NOTE — Telephone Encounter (Signed)
TC from [facility] @ [facility] She got in shipment of Hydrocodone 10-325 mg today She will fill pt's script for this dose & cancel the 7.5-325 mg script that was sent in today.

## 2022-12-26 ENCOUNTER — Ambulatory Visit: Payer: Medicare Other | Admitting: Nurse Practitioner

## 2023-01-09 ENCOUNTER — Ambulatory Visit: Payer: Medicare Other | Admitting: Nurse Practitioner

## 2023-01-17 DIAGNOSIS — E1165 Type 2 diabetes mellitus with hyperglycemia: Secondary | ICD-10-CM | POA: Diagnosis not present

## 2023-01-19 ENCOUNTER — Telehealth: Payer: Self-pay | Admitting: Pharmacist

## 2023-01-19 ENCOUNTER — Telehealth: Payer: Self-pay | Admitting: Family Medicine

## 2023-01-19 NOTE — Telephone Encounter (Signed)
Contacted [name] to schedule their annual wellness visit. Appointment made for 01/30/2023.  Thank you,  Colletta [place],  [place] [facility] Direct Dial ??[phone]

## 2023-01-23 ENCOUNTER — Ambulatory Visit: Payer: Medicare Other | Admitting: Nurse Practitioner

## 2023-01-25 ENCOUNTER — Ambulatory Visit (INDEPENDENT_AMBULATORY_CARE_PROVIDER_SITE_OTHER): Payer: Medicare Other | Admitting: Family Medicine

## 2023-01-25 ENCOUNTER — Encounter: Payer: Self-pay | Admitting: Family Medicine

## 2023-01-25 VITALS — BP 117/76 | HR 95 | Ht 74.0 in | Wt 212.0 lb

## 2023-01-25 DIAGNOSIS — Z89512 Acquired absence of left leg below knee: Secondary | ICD-10-CM

## 2023-01-25 DIAGNOSIS — Z0289 Encounter for other administrative examinations: Secondary | ICD-10-CM

## 2023-01-25 DIAGNOSIS — E1159 Type 2 diabetes mellitus with other circulatory complications: Secondary | ICD-10-CM

## 2023-01-25 DIAGNOSIS — E1142 Type 2 diabetes mellitus with diabetic polyneuropathy: Secondary | ICD-10-CM | POA: Diagnosis not present

## 2023-01-25 DIAGNOSIS — M19011 Primary osteoarthritis, right shoulder: Secondary | ICD-10-CM | POA: Diagnosis not present

## 2023-01-25 DIAGNOSIS — M5136 Other intervertebral disc degeneration, lumbar region: Secondary | ICD-10-CM

## 2023-01-25 DIAGNOSIS — E785 Hyperlipidemia, unspecified: Secondary | ICD-10-CM | POA: Diagnosis not present

## 2023-01-25 DIAGNOSIS — I10 Essential (primary) hypertension: Secondary | ICD-10-CM

## 2023-01-25 DIAGNOSIS — I152 Hypertension secondary to endocrine disorders: Secondary | ICD-10-CM | POA: Diagnosis not present

## 2023-01-25 DIAGNOSIS — E1122 Type 2 diabetes mellitus with diabetic chronic kidney disease: Secondary | ICD-10-CM

## 2023-01-25 DIAGNOSIS — Z794 Long term (current) use of insulin: Secondary | ICD-10-CM | POA: Diagnosis not present

## 2023-01-25 DIAGNOSIS — I4811 Longstanding persistent atrial fibrillation: Secondary | ICD-10-CM | POA: Diagnosis not present

## 2023-01-25 DIAGNOSIS — E1169 Type 2 diabetes mellitus with other specified complication: Secondary | ICD-10-CM | POA: Diagnosis not present

## 2023-01-25 LAB — BAYER DCA HB A1C WAIVED: HB A1C (BAYER DCA - WAIVED): 7.9 % — ABNORMAL HIGH (ref 4.8–5.6)

## 2023-01-25 LAB — LIPID PANEL

## 2023-01-25 MED ORDER — HYDROCODONE-ACETAMINOPHEN 10-325 MG PO TABS: 1.0000 | ORAL_TABLET | Freq: Two times a day (BID) | ORAL | 0 refills | Status: DC | PRN

## 2023-01-25 MED ORDER — METHYLPREDNISOLONE ACETATE 80 MG/ML IJ SUSP
80.0000 mg | Freq: Once | INTRAMUSCULAR | Status: AC
  Administered 2023-01-25: 80 mg via INTRA_ARTICULAR

## 2023-01-25 NOTE — Progress Notes (Signed)
BP 117/76   Pulse 95   Ht 6\' 2"  (1.88 m)   Wt 96.2 kg   SpO2 98%   BMI 27.22 kg/m    Subjective:   Patient ID: [name], male    DOB: 1966-07-13, 57 y.o.   MRN: [id number]  HPI: [name] is a 57 y.o. male presenting on 01/25/2023 for Medical Management of Chronic Issues, Hypertension, Diabetes, and Chronic Kidney Disease  Type 2 diabetes mellitus Patient comes in today for recheck of their Type 2 Diabetes Mellitus. Patient's current medications include Ozempic, Tresiba, and Novolog. Blood glucose readings at home have been 220-240s after eating. Patient states he has been eating lots of bread rolls and sweets. Patient is not currently taking an ACE inhibitor/ARB. Patient has not seen an ophthalmologist this year. Patient's last ophthalmology exam was September 2023. Patient has bilateral knee amputations. The symptom started onset as an adult. Hyperlipidemia and hypertension are related to Type 2 Diabetes Mellitus.    Hypertension Patient is coming in for a recheck of their hypertension. Patient is currently taking  Metoprolol. Patient's blood pressure today is 117/76. Patient denies lightheadedness, dizziness, weakness, headaches, blurred vision, chest pains, and shortness of breath. Patient denies any side effects from medication(s) and is content with current medication(s).   Patient is presenting with right anterior shoulder pain that has been present since last year. Patient has been using voltaren gel and NSAIDs. Patient states the voltaren gel and hydrocodone have helped with the pain until the medication wears off. Pain is worse in the morning upon awakening and at rest. Patient has not seen an orthopedic. Patient insists on trying an injection first.  Pain assessment: Cause of pain: degenerative disc disease, lumbar Pain location: bilateral lower back and legs Pain on scale of 1-10: 5 Frequency: daily What increases pain: being on feet and trying to walk especially  with his prosthetic legs What makes pain Better: medication Effects on ADL: limited, some especially with amputations Any change in general medical condition: none   Current opioids rx: Norco 10-3 25 twice daily as needed # meds rx: 60/month Effectiveness of current meds: works well Adverse reactions from pain meds: none Morphine equivalent: 20   Pill count performed: No Last drug screen: 01/25/2023 ( high risk q29m, moderate risk q65m, low risk yearly) Urine drug screen today- Yes Was the [place] reviewed-yes             If yes were their any concerning findings? No   Pain contract signed on: 01/25/2023  Relevant past medical, surgical, family and social history were reviewed and updated as indicated. Interim medical history were reviewed. Allergies and medications were reviewed and updated.  Review of Systems  Constitutional:  Negative for chills and fever.  Eyes:  Negative for visual disturbance.  Respiratory:  Negative for chest tightness and shortness of breath.   Gastrointestinal:  Negative for abdominal pain.  Musculoskeletal:  Positive for arthralgias.  Skin:  Negative for color change, rash and wound.  Neurological:  Negative for dizziness, weakness, light-headedness, numbness and headaches.  Psychiatric/Behavioral:  Negative for behavioral problems and confusion.   All other systems reviewed and are negative.   Per HPI unless specifically indicated above.   Allergies as of 01/25/2023   No Known Allergies      Medication List        Accurate as of January 25, 2023 10:52 AM. If you have any questions, ask your nurse or doctor.  apixaban 5 MG Tabs tablet Commonly known as: ELIQUIS Take 1 tablet (5 mg total) by mouth 2 (two) times daily.   atorvastatin 20 MG tablet Commonly known as: LIPITOR TAKE 1 TABLET BY MOUTH EVERY DAY   benzonatate 100 MG capsule Commonly known as: Tessalon Perles Take 1 capsule (100 mg total) by mouth 3 (three) times daily as  needed for cough.   DULoxetine 60 MG capsule Commonly known as: Cymbalta Take 1 capsule (60 mg total) by mouth daily.   fluticasone 50 MCG/ACT nasal spray Commonly known as: FLONASE Place 1 spray into both nostrils 2 (two) times daily as needed for allergies or rhinitis.   FreeStyle [place] 2 Reader [place] Use as directed to test blood sugar up to 6 times daily as directed. DX: E 11.9   FreeStyle Libre 2 Sensor Misc Use as directed to test blood sugar up to 6 times daily as directed. DX: E 11.9   gabapentin 600 MG tablet Commonly known as: NEURONTIN Take 1 tablet (600 mg total) by mouth 3 (three) times daily.   HYDROcodone-acetaminophen 10-325 MG tablet Commonly known as: NORCO Take 1 tablet by mouth 2 (two) times daily as needed. What changed:  Another medication with the same name was changed. Make sure you understand how and when to take each. Another medication with the same name was removed. Continue taking this medication, and follow the directions you see here. Changed by: [name], MD   HYDROcodone-acetaminophen 10-325 MG tablet Commonly known as: NORCO Take 1 tablet by mouth 2 (two) times daily as needed. Start taking on: February 24, 2023 What changed:  These instructions start on February 24, 2023. If you are unsure what to do until then, ask your doctor or other care provider. Another medication with the same name was removed. Continue taking this medication, and follow the directions you see here. Changed by: [name], MD   HYDROcodone-acetaminophen 10-325 MG tablet Commonly known as: NORCO Take 1 tablet by mouth 2 (two) times daily as needed. Start taking on: February 24, 2023 What changed:  These instructions start on February 24, 2023. If you are unsure what to do until then, ask your doctor or other care provider. Another medication with the same name was removed. Continue taking this medication, and follow the directions you see here. Changed by:  [name] Anita Mcadory, MD   metFORMIN 1000 MG tablet Commonly known as: GLUCOPHAGE Take 1 tablet (1,000 mg total) by mouth 2 (two) times daily.   metoprolol succinate 25 MG 24 hr tablet Commonly known as: TOPROL-XL TAKE 1 TABLET BY MOUTH EVERY DAY   multivitamin with minerals tablet Take 1 tablet by mouth daily.   NovoLOG FlexPen 100 UNIT/ML FlexPen Generic drug: insulin aspart Inject 5-10 Units into the skin 3 (three) times daily with meals.   Semaglutide (2 MG/DOSE) 8 MG/3ML Sopn Inject 2 mg as directed once a week.   Tyler Aas FlexTouch 100 UNIT/ML FlexTouch Pen Generic drug: insulin degludec Inject 72-74 Units into the skin daily.         Objective:   BP 117/76   Pulse 95   Ht 6\' 2"  (1.88 m)   Wt 96.2 kg   SpO2 98%   BMI 27.22 kg/m   Wt Readings from Last 3 Encounters:  01/25/23 96.2 kg  10/25/22 95.7 kg  07/26/22 89.8 kg    Physical Exam Vitals reviewed.  Constitutional:      Appearance: Normal appearance.  HENT:     Head:  Normocephalic.  Eyes:     General: No scleral icterus.    Conjunctiva/sclera: Conjunctivae normal.  Cardiovascular:     Rate and Rhythm: Normal rate and regular rhythm.     Heart sounds: Normal heart sounds.  Pulmonary:     Effort: Pulmonary effort is normal.     Breath sounds: Normal breath sounds.  Musculoskeletal:     Right shoulder: Tenderness present. No swelling or deformity. Normal range of motion.     Left shoulder: Normal.       Arms:     Comments: Tender to palpation in anterior right shoulder. Some popping felt with flexion/extension and abduction/adduction. Patient is able to do all ranges of motion fully, but liftoff test causes pain.     Right Lower Extremity: Right leg is amputated below knee.     Left Lower Extremity: Left leg is amputated below knee.  Skin:    General: Skin is warm and dry.  Neurological:     Mental Status: He is alert.  Psychiatric:        Mood and Affect: Mood normal.        Behavior:  Behavior normal.    Assessment & Plan:   Problem List Items Addressed This Visit       Cardiovascular and Mediastinum   Essential hypertension, benign   A-fib (HCC)     Endocrine   Neuropathy in diabetes ([place])   Type 2 diabetes mellitus ([place]) - Primary   Relevant Orders   Bayer DCA Hb A1c Waived   CBC with Differential/Platelet   CMP14+EGFR   Lipid panel   Microalbumin / creatinine urine ratio     Musculoskeletal and Integument   DDD (degenerative disc disease), lumbar   Relevant Medications   HYDROcodone-acetaminophen (NORCO) 10-325 MG tablet (Start on 02/24/2023)   HYDROcodone-acetaminophen (NORCO) 10-325 MG tablet (Start on 02/24/2023)   HYDROcodone-acetaminophen (NORCO) 10-325 MG tablet   Other Relevant Orders   ToxASSURE Select 13 (MW), Urine     Other   Pain management contract signed   Relevant Medications   HYDROcodone-acetaminophen (NORCO) 10-325 MG tablet (Start on 02/24/2023)   HYDROcodone-acetaminophen (NORCO) 10-325 MG tablet (Start on 02/24/2023)   HYDROcodone-acetaminophen (NORCO) 10-325 MG tablet   Other Relevant Orders   ToxASSURE Select 13 (MW), Urine   S/P BKA (below knee amputation) unilateral, left (HCC)   Relevant Medications   HYDROcodone-acetaminophen (NORCO) 10-325 MG tablet (Start on 02/24/2023)   HYDROcodone-acetaminophen (NORCO) 10-325 MG tablet (Start on 02/24/2023)   HYDROcodone-acetaminophen (NORCO) 10-325 MG tablet   Other Relevant Orders   ToxASSURE Select 13 (MW), Urine   Dyslipidemia   Other Visit Diagnoses     Primary osteoarthritis of right shoulder       Relevant Medications   HYDROcodone-acetaminophen (NORCO) 10-325 MG tablet (Start on 02/24/2023)   HYDROcodone-acetaminophen (NORCO) 10-325 MG tablet (Start on 02/24/2023)   HYDROcodone-acetaminophen (NORCO) 10-325 MG tablet   methylPREDNISolone acetate (DEPO-MEDROL) injection 80 mg (Completed)      A1c is 7.9 today. Patient instructed to increase Novolog dose from 12 units to 15  units when he eats a meal that contains carbohydrates. Patient also encouraged to decrease carbohydrate intake.  Patient insists on receiving an injection in right shoulder before going to an orthopedist. Patient was given a Depo-Medrol injection 80 mg into posterolateral aspect of right shoulder.   Continue all other medications.   Follow up plan: Return in about 3 months (around 04/27/2023), or if symptoms worsen or fail to improve, for Diabetes  recheck.  Counseling provided for all of the vaccine components Orders Placed This Encounter  Procedures   Bayer DCA Hb A1c Waived   CBC with Differential/Platelet   CMP14+EGFR   Lipid panel   Microalbumin / creatinine urine ratio   ToxASSURE Select 13 (MW), Urine   Summer Dunlap, PA-S2 [place] 01/25/2023, 10:52 AM  Patient seen and examined PA student, agree with assessment and plan above.  Continue current medicine, refill current medicine as well. Vonna Kotyk [place] [facility] [facility] 01/25/2023, 10:52 AM

## 2023-01-26 LAB — CBC WITH DIFFERENTIAL/PLATELET
Basophils Absolute: 0 10*3/uL (ref 0.0–0.2)
Basos: 0 %
EOS (ABSOLUTE): 0.1 10*3/uL (ref 0.0–0.4)
Eos: 1 %
Hematocrit: 49.2 % (ref 37.5–51.0)
Hemoglobin: 16.5 g/dL (ref 13.0–17.7)
Immature Grans (Abs): 0 10*3/uL (ref 0.0–0.1)
Immature Granulocytes: 1 %
Lymphocytes Absolute: 2.3 10*3/uL (ref 0.7–3.1)
Lymphs: 32 %
MCH: 30.7 pg (ref 26.6–33.0)
MCHC: 33.5 g/dL (ref 31.5–35.7)
MCV: 92 fL (ref 79–97)
Monocytes Absolute: 0.5 10*3/uL (ref 0.1–0.9)
Monocytes: 7 %
Neutrophils Absolute: 4.3 10*3/uL (ref 1.4–7.0)
Neutrophils: 59 %
Platelets: 178 10*3/uL (ref 150–450)
RBC: 5.37 x10E6/uL (ref 4.14–5.80)
RDW: 12.1 % (ref 11.6–15.4)
WBC: 7.2 10*3/uL (ref 3.4–10.8)

## 2023-01-26 LAB — CMP14+EGFR
ALT: 15 IU/L (ref 0–44)
AST: 13 IU/L (ref 0–40)
Albumin/Globulin Ratio: 1.6 (ref 1.2–2.2)
Albumin: 4 g/dL (ref 3.8–4.9)
Alkaline Phosphatase: 164 IU/L — ABNORMAL HIGH (ref 44–121)
BUN/Creatinine Ratio: 18 (ref 9–20)
BUN: 20 mg/dL (ref 6–24)
Bilirubin Total: 0.7 mg/dL (ref 0.0–1.2)
CO2: 24 mmol/L (ref 20–29)
Calcium: 9.2 mg/dL (ref 8.7–10.2)
Chloride: 99 mmol/L (ref 96–106)
Creatinine, Ser: 1.09 mg/dL (ref 0.76–1.27)
Globulin, Total: 2.5 g/dL (ref 1.5–4.5)
Glucose: 239 mg/dL — ABNORMAL HIGH (ref 70–99)
Potassium: 4.8 mmol/L (ref 3.5–5.2)
Sodium: 139 mmol/L (ref 134–144)
Total Protein: 6.5 g/dL (ref 6.0–8.5)
eGFR: 79 mL/min/{1.73_m2} (ref >59)

## 2023-01-26 LAB — LIPID PANEL
Chol/HDL Ratio: 2.9 ratio (ref 0.0–5.0)
Cholesterol, Total: 73 mg/dL — ABNORMAL LOW (ref 100–199)
HDL: 25 mg/dL — ABNORMAL LOW (ref >39)
LDL Chol Calc (NIH): 23 mg/dL (ref 0–99)
Triglycerides: 145 mg/dL (ref 0–149)
VLDL Cholesterol Cal: 25 mg/dL (ref 5–40)

## 2023-01-28 LAB — MICROALBUMIN / CREATININE URINE RATIO
Creatinine, Urine: 311.2 mg/dL
Microalb/Creat Ratio: 41 mg/g creat — ABNORMAL HIGH (ref 0–29)
Microalbumin, Urine: 126.1 ug/mL

## 2023-01-30 ENCOUNTER — Ambulatory Visit (INDEPENDENT_AMBULATORY_CARE_PROVIDER_SITE_OTHER): Payer: Medicare Other

## 2023-01-30 VITALS — Ht 74.0 in | Wt 212.0 lb

## 2023-01-30 DIAGNOSIS — Z0000 Encounter for general adult medical examination without abnormal findings: Secondary | ICD-10-CM | POA: Diagnosis not present

## 2023-01-30 DIAGNOSIS — Z1211 Encounter for screening for malignant neoplasm of colon: Secondary | ICD-10-CM

## 2023-01-30 LAB — TOXASSURE SELECT 13 (MW), URINE

## 2023-01-30 NOTE — Patient Instructions (Signed)
[name] , Thank you for taking time to come for your Medicare Wellness Visit. I appreciate your ongoing commitment to your health goals. Please review the following plan we discussed and let me know if I can assist you in the future.   These are the goals we discussed:  Goals       DIET - INCREASE WATER INTAKE      T2DM-PHARMD GOAL (pt-stated)      Current Barriers:  Unable to independently afford treatment regimen Unable to maintain control of T2DM  Pharmacist Clinical Goal(s):  Over the next 90 days, patient will verbalize ability to afford treatment regimen maintain control of T2DM as evidenced by IMPROVED GLYCEMIC CONTROL  through collaboration with PharmD and provider.   Interventions: 1:1 collaboration with Dettinger, [name], MD regarding development and update of comprehensive plan of care as evidenced by provider attestation and co-signature Inter-disciplinary care team collaboration (see longitudinal plan of care) Comprehensive medication review performed; medication list updated in electronic medical record  Diabetes: Uncontrolled (A1C 7.7%-->9.5%--INCREASED) Current treatment: TRESIBA 68-72 UNITS DAILY, OZEMPIC 1MG  SQ WEEKLY-->increasing to 2mg  once patient assistance supply arrives;  Received notification from [facility] regarding 2023 RE-ENROLLMENT Phone: [phone]; MEDICATION TO SHIP TO PCP OFFICE PER PROGRAM RULES CONTINUE OZEMPIC TO 1MG  SQ WEEKLY AND TRESIBA 68-72 UNITS DAILY WILL HAVE TO AWAIT NEW SUPPLY FROM PATIENT ASSISTANCE-->increase Ozempic dose to 2mg  weekly, patient states Bgs are better but a1c not at goal Denies personal and family history of Medullary thyroid cancer (MTC) USES LIBRE CGM--CALLED IN LIBRE 2 cgm ADS Current glucose readings: fasting glucose: <150, post prandial glucose: <200 Denies hypoglycemic/hyperglycemic symptoms Discussed meal planning options and Plate method for healthy eating--ADMITS TO EATING INCREASED CARBOHYDRATES Avoid  sugary drinks and desserts Incorporate balanced protein, non starchy veggies, 1 serving of carbohydrate with each meal Increase water intake Increase physical activity as able Current exercise: N/A Recommended INCREASE IN GLP1 (OZEMPIC) Assessed patient finances. PATIENT RE-ENROLLED FOR NOVO [place] 2023  ([place])  Hypertension: -DISCUSSED BLOOD PRESSURE-- Would benefit from ACEi/ARB for diabetes, will address with PCP at patient's next visit.  ON METOPROLOL FOR AFIB PER CARDS He was previously on lisinopril per chart notes: "BP was soft last month, therefore his PCP discontinued Lisinopril.  He was discharged on Cardizem CD but refills were not provided, therefore he discontinued this several weeks ago.  BP is at goal <130/80  Patient Goals/Self-Care Activities Over the next 90 days, patient will:  - take medications as prescribed check glucose USING LIBRE CGM, document, and provide at future appointments collaborate with provider on medication access solutions  Follow Up Plan: Telephone follow up appointment with care management team member scheduled for: 1 MONTH         This is a list of the screening recommended for you and due dates:  Health Maintenance  Topic Date Due   COVID-19 Vaccine (3 - Moderna risk series) 07/12/2020   Colon Cancer Screening  08/02/2022   Stool Blood Test  04/26/2023*   Eye exam for diabetics  03/23/2023   Hemoglobin A1C  07/28/2023   Yearly kidney function blood test for diabetes  01/25/2024   Yearly kidney health urinalysis for diabetes  01/25/2024   Medicare Annual Wellness Visit  01/30/2024   DTaP/Tdap/Td vaccine (2 - Td or Tdap) 07/08/2030   Flu Shot  Completed   Hepatitis C Screening: USPSTF Recommendation to screen - Ages 18-79 yo.  Completed   HIV Screening  Completed   Zoster (Shingles)  Vaccine  Completed   HPV Vaccine  Aged Out   Complete foot exam   Discontinued  *Topic was postponed. The date shown is not the original  due date.    Advanced directives: Advance directive discussed with you today. I have provided a copy for you to complete at home and have notarized. Once this is complete please bring a copy in to our office so we can scan it into your chart.   Conditions/risks identified: Aim for 30 minutes of exercise or brisk walking, 6-8 glasses of water, and 5 servings of fruits and vegetables each day.   Next appointment: Follow up in one year for your annual wellness visit   Preventive Care 40-64 Years, Male Preventive care refers to lifestyle choices and visits with your health care provider that can promote health and wellness. What does preventive care include? A yearly physical exam. This is also called an annual well check. Dental exams once or twice a year. Routine eye exams. Ask your health care provider how often you should have your eyes checked. Personal lifestyle choices, including: Daily care of your teeth and gums. Regular physical activity. Eating a healthy diet. Avoiding tobacco and drug use. Limiting alcohol use. Practicing safe sex. Taking low-dose aspirin every day starting at age 52. What happens during an annual well check? The services and screenings done by your health care provider during your annual well check will depend on your age, overall health, lifestyle risk factors, and family history of disease. Counseling  Your health care provider may ask you questions about your: Alcohol use. Tobacco use. Drug use. Emotional well-being. Home and relationship well-being. Sexual activity. Eating habits. Work and work Statistician. Screening  You may have the following tests or measurements: Height, weight, and BMI. Blood pressure. Lipid and cholesterol levels. These may be checked every 5 years, or more frequently if you are over 52 years old. Skin check. Lung cancer screening. You may have this screening every year starting at age 19 if you have a 30-pack-year history  of smoking and currently smoke or have quit within the past 15 years. Fecal occult blood test (FOBT) of the stool. You may have this test every year starting at age 69. Flexible sigmoidoscopy or colonoscopy. You may have a sigmoidoscopy every 5 years or a colonoscopy every 10 years starting at age 75. Prostate cancer screening. Recommendations will vary depending on your family history and other risks. Hepatitis C blood test. Hepatitis B blood test. Sexually transmitted disease (STD) testing. Diabetes screening. This is done by checking your blood sugar (glucose) after you have not eaten for a while (fasting). You may have this done every 1-3 years. Discuss your test results, treatment options, and if necessary, the need for more tests with your health care provider. Vaccines  Your health care provider may recommend certain vaccines, such as: Influenza vaccine. This is recommended every year. Tetanus, diphtheria, and acellular pertussis (Tdap, Td) vaccine. You may need a Td booster every 10 years. Zoster vaccine. You may need this after age 31. Pneumococcal 13-valent conjugate (PCV13) vaccine. You may need this if you have certain conditions and have not been vaccinated. Pneumococcal polysaccharide (PPSV23) vaccine. You may need one or two doses if you smoke cigarettes or if you have certain conditions. Talk to your health care provider about which screenings and vaccines you need and how often you need them. This information is not intended to replace advice given to you by your health care provider. Make sure  you discuss any questions you have with your health care provider. Document Released: 11/19/2015 Document Revised: 07/12/2016 Document Reviewed: 08/24/2015 Elsevier Interactive Patient Education  2017 [place] Prevention in the Home Falls can cause injuries. They can happen to people of all ages. There are many things you can do to make your home safe and to help prevent  falls. What can I do on the outside of my home? Regularly fix the edges of walkways and driveways and fix any cracks. Remove anything that might make you trip as you walk through a door, such as a raised step or threshold. Trim any bushes or trees on the path to your home. Use bright outdoor lighting. Clear any walking paths of anything that might make someone trip, such as rocks or tools. Regularly check to see if handrails are loose or broken. Make sure that both sides of any steps have handrails. Any raised decks and porches should have guardrails on the edges. Have any leaves, snow, or ice cleared regularly. Use sand or salt on walking paths during winter. Clean up any spills in your garage right away. This includes oil or grease spills. What can I do in the bathroom? Use night lights. Install grab bars by the toilet and in the tub and shower. Do not use towel bars as grab bars. Use non-skid mats or decals in the tub or shower. If you need to sit down in the shower, use a plastic, non-slip stool. Keep the floor dry. Clean up any water that spills on the floor as soon as it happens. Remove soap buildup in the tub or shower regularly. Attach bath mats securely with double-sided non-slip rug tape. Do not have throw rugs and other things on the floor that can make you trip. What can I do in the bedroom? Use night lights. Make sure that you have a light by your bed that is easy to reach. Do not use any sheets or blankets that are too big for your bed. They should not hang down onto the floor. Have a firm chair that has side arms. You can use this for support while you get dressed. Do not have throw rugs and other things on the floor that can make you trip. What can I do in the kitchen? Clean up any spills right away. Avoid walking on wet floors. Keep items that you use a lot in easy-to-reach places. If you need to reach something above you, use a strong step stool that has a grab  bar. Keep electrical cords out of the way. Do not use floor polish or wax that makes floors slippery. If you must use wax, use non-skid floor wax. Do not have throw rugs and other things on the floor that can make you trip. What can I do with my stairs? Do not leave any items on the stairs. Make sure that there are handrails on both sides of the stairs and use them. Fix handrails that are broken or loose. Make sure that handrails are as long as the stairways. Check any carpeting to make sure that it is firmly attached to the stairs. Fix any carpet that is loose or worn. Avoid having throw rugs at the top or bottom of the stairs. If you do have throw rugs, attach them to the floor with carpet tape. Make sure that you have a light switch at the top of the stairs and the bottom of the stairs. If you do not have them, ask someone  to add them for you. What else can I do to help prevent falls? Wear shoes that: Do not have high heels. Have rubber bottoms. Are comfortable and fit you well. Are closed at the toe. Do not wear sandals. If you use a stepladder: Make sure that it is fully opened. Do not climb a closed stepladder. Make sure that both sides of the stepladder are locked into place. Ask someone to hold it for you, if possible. Clearly mark and make sure that you can see: Any grab bars or handrails. First and last steps. Where the edge of each step is. Use tools that help you move around (mobility aids) if they are needed. These include: Canes. Walkers. Scooters. Crutches. Turn on the lights when you go into a dark area. Replace any light bulbs as soon as they burn out. Set up your furniture so you have a clear path. Avoid moving your furniture around. If any of your floors are uneven, fix them. If there are any pets around you, be aware of where they are. Review your medicines with your doctor. Some medicines can make you feel dizzy. This can increase your chance of falling. Ask  your doctor what other things that you can do to help prevent falls. This information is not intended to replace advice given to you by your health care provider. Make sure you discuss any questions you have with your health care provider. Document Released: 08/19/2009 Document Revised: 03/30/2016 Document Reviewed: 11/27/2014 Elsevier Interactive Patient Education  2017 [employer].

## 2023-01-30 NOTE — Progress Notes (Signed)
Subjective:   [name] is a 57 y.o. male who presents for Medicare Annual/Subsequent preventive examination. I connected with  [name] on 01/30/23 by a audio enabled telemedicine application and verified that I am speaking with the correct person using two identifiers.  Patient Location: Home  Provider Location: Home Office  I discussed the limitations of evaluation and management by telemedicine. The patient expressed understanding and agreed to proceed.  Review of Systems     Cardiac Risk Factors include: advanced age (>25men, >48 women);diabetes mellitus;male gender;hypertension;dyslipidemia     Objective:    Today's Vitals   01/30/23 1445  Weight: 212 lb (96.2 kg)  Height: 6\' 2"  (1.88 m)   Body mass index is 27.22 kg/m.     01/30/2023    2:49 PM 07/26/2021   10:16 AM 02/19/2020   11:15 AM 02/18/2020   11:12 AM 11/05/2019   11:04 AM 11/04/2019    6:00 PM 10/29/2019   11:00 AM  Advanced Directives  Does Patient Have a Medical Advance Directive? Yes No No No No No No  Type of Paramedic of [place];Living will        Copy of [place] in Chart? No - copy requested        Would patient like information on creating a medical advance directive?  No - Patient declined No - Patient declined No - Patient declined  No - Patient declined No - Patient declined    Current Medications (verified) Outpatient Encounter Medications as of 01/30/2023  Medication Sig   apixaban (ELIQUIS) 5 MG TABS tablet Take 1 tablet (5 mg total) by mouth 2 (two) times daily.   atorvastatin (LIPITOR) 20 MG tablet TAKE 1 TABLET BY MOUTH EVERY DAY   Continuous Blood Gluc Receiver (FREESTYLE LIBRE 2 READER) DEVI Use as directed to test blood sugar up to 6 times daily as directed. DX: E 11.9   Continuous Blood Gluc Sensor (FREESTYLE LIBRE 2 SENSOR) MISC Use as directed to test blood sugar up to 6 times daily as directed. DX: E 11.9   DULoxetine  (CYMBALTA) 60 MG capsule Take 1 capsule (60 mg total) by mouth daily.   fluticasone (FLONASE) 50 MCG/ACT nasal spray Place 1 spray into both nostrils 2 (two) times daily as needed for allergies or rhinitis.   gabapentin (NEURONTIN) 600 MG tablet Take 1 tablet (600 mg total) by mouth 3 (three) times daily.   [START ON 02/24/2023] HYDROcodone-acetaminophen (NORCO) 10-325 MG tablet Take 1 tablet by mouth 2 (two) times daily as needed.   HYDROcodone-acetaminophen (NORCO) 10-325 MG tablet Take 1 tablet by mouth 2 (two) times daily as needed.   [START ON 02/24/2023] HYDROcodone-acetaminophen (NORCO) 10-325 MG tablet Take 1 tablet by mouth 2 (two) times daily as needed.   insulin aspart (NOVOLOG FLEXPEN) 100 UNIT/ML FlexPen Inject 5-10 Units into the skin 3 (three) times daily with meals.   insulin degludec (TRESIBA FLEXTOUCH) 100 UNIT/ML FlexTouch Pen Inject 72-74 Units into the skin daily.   metFORMIN (GLUCOPHAGE) 1000 MG tablet Take 1 tablet (1,000 mg total) by mouth 2 (two) times daily.   metoprolol succinate (TOPROL-XL) 25 MG 24 hr tablet TAKE 1 TABLET BY MOUTH EVERY DAY   Multiple Vitamins-Minerals (MULTIVITAMIN WITH MINERALS) tablet Take 1 tablet by mouth daily.   Semaglutide, 2 MG/DOSE, 8 MG/3ML SOPN Inject 2 mg as directed once a week.   benzonatate (TESSALON PERLES) 100 MG capsule Take 1 capsule (100 mg total) by mouth  3 (three) times daily as needed for cough. (Patient not taking: Reported on 01/30/2023)   No facility-administered encounter medications on file as of 01/30/2023.    Allergies (verified) Patient has no known allergies.   History: Past Medical History:  Diagnosis Date   Charcot ankle    Essential hypertension    Osteomyelitis of toe of right foot (HCC)    Paroxysmal atrial fibrillation (HCC)    Recurrent boils    Type 2 diabetes mellitus with diabetic neuropathy [facility])    Diagnosed age 41   Past Surgical History:  Procedure Laterality Date   AMPUTATION Right 06/17/2014    Procedure: PARTIAL AMPUTATION RIGHT 3RD TOE;  Surgeon: Marcheta Grammes, DPM;  Location: [facility];  Service: Podiatry;  Laterality: Right;   AMPUTATION Right 09/02/2014   Procedure: PARTIAL AMPUTATION 2ND TOE RIGHT FOOT;  Surgeon: Marcheta Grammes, DPM;  Location: [facility];  Service: Podiatry;  Laterality: Right;   AMPUTATION Right 04/01/2015   Procedure: AMPUTATION DIGIT 1ST TOE RIGHT FOOT;  Surgeon: Juanita Laster, DPM;  Location: [facility];  Service: Podiatry;  Laterality: Right;   AMPUTATION Right 06/29/2016   pinkeye    AMPUTATION Right 10/24/2019   Procedure: PARTIAL THIRD RAY AMPUTATION;  Surgeon: [name], DPM;  Location: [facility];  Service: Podiatry;  Laterality: Right;   AMPUTATION Right 02/19/2020   Procedure: AMPUTATION BELOW KNEE; RIGHT;  Surgeon: Aviva Signs, MD;  Location: [facility];  Service: General;  Laterality: Right;   FOOT AMPUTATION Left    FOOT SURGERY Left 03/2017   HERNIA REPAIR Bilateral    and 3rd hernia repair does not remember ehat side   IRRIGATION AND DEBRIDEMENT ABSCESS Right 11/05/2019   Procedure: IRRIGATION AND DRAINAGE ABSCESS RIGHT ANKLE;  Surgeon: [name], DPM;  Location: [facility];  Service: Podiatry;  Laterality: Right;   TEE WITHOUT CARDIOVERSION N/A 10/29/2019   Procedure: TRANSESOPHAGEAL ECHOCARDIOGRAM (TEE) WITH PROPOFOL;  Surgeon: Fay Records, MD;  Location: [facility];  Service: Cardiovascular;  Laterality: N/A;   Family History  Problem Relation Age of Onset   Diabetes Mother    Stroke Father    Asthma Father    COPD Father    Aneurysm Father        AAA   Diabetes Brother    Stroke Brother        2008   Colon cancer Neg Hx    Heart disease Neg Hx    Rectal cancer Neg Hx    Stomach cancer Neg Hx    Social History   Socioeconomic History   Marital status: Significant Other    Spouse name: Not on file   Number of children: 1   Years of education: Not on file   Highest education level: Some college, no degree   Occupational History   Occupation: Receiving  Tobacco Use   Smoking status: Never   Smokeless tobacco: Never  Vaping Use   Vaping Use: Never used  Substance and Sexual Activity   Alcohol use: No   Drug use: No   Sexual activity: Not on file  Other Topics Concern   Not on file  Social History Narrative   Has a significant other - lives alone - son lives in [place] Determinants of Health   Financial Resource Strain: [place]  (01/30/2023)   Overall Financial Resource Strain (CARDIA)    Difficulty of Paying Living Expenses: Not hard at all  Food Insecurity: No [place] (01/30/2023)  Hunger Vital Sign    Worried About Running Out of Food in the Last Year: Never true    Ran Out of Food in the Last Year: Never true  Transportation Needs: No Transportation Needs (01/30/2023)   PRAPARE - Hydrologist (Medical): No    Lack of Transportation (Non-Medical): No  Physical Activity: Sufficiently Active (01/30/2023)   Exercise Vital Sign    Days of Exercise per Week: 5 days    Minutes of Exercise per Session: 60 min  Recent Concern: Physical Activity - Insufficiently Active (01/24/2023)   Exercise Vital Sign    Days of Exercise per Week: 5 days    Minutes of Exercise per Session: 10 min  Stress: No Stress Concern Present (01/30/2023)   [place]    Feeling of Stress : Not at all  Social Connections: Moderately Integrated (01/30/2023)   Social Connection and Isolation Panel [NHANES]    Frequency of Communication with Friends and Family: More than three times a week    Frequency of Social Gatherings with Friends and Family: More than three times a week    Attends Religious Services: More than 4 times per year    Active Member of [employer] or Organizations: No    Attends Archivist Meetings: Never    Marital Status: Living with partner  Recent Concern: Social Connections -  Moderately Isolated (01/24/2023)   Social Connection and Isolation Panel [NHANES]    Frequency of Communication with Friends and Family: More than three times a week    Frequency of Social Gatherings with Friends and Family: More than three times a week    Attends Religious Services: More than 4 times per year    Active Member of [employer] or Organizations: No    Attends Music therapist: Not on file    Marital Status: Never married    Tobacco Counseling Counseling given: Not Answered   Clinical Intake:  Pre-visit preparation completed: Yes  Pain : No/denies pain     Nutritional Risks: None Diabetes: Yes CBG done?: No Did pt. bring in CBG monitor from home?: No  How often do you need to have someone help you when you read instructions, pamphlets, or other written materials from your doctor or pharmacy?: 1 - Never  Diabetic?yes Nutrition Risk Assessment:  Has the patient had any N/V/D within the last 2 months?  No  Does the patient have any non-healing wounds?  No  Has the patient had any unintentional weight loss or weight gain?  No   Diabetes:  Is the patient diabetic?  Yes  If diabetic, was a CBG obtained today?  No  Did the patient bring in their glucometer from home?  No  How often do you monitor your CBG's? Libre.   Financial Strains and Diabetes Management:  Are you having any financial strains with the device, your supplies or your medication? No .  Does the patient want to be seen by Chronic Care Management for management of their diabetes?  No  Would the patient like to be referred to a Nutritionist or for Diabetic Management?  No   Diabetic Exams:  Diabetic Eye Exam: Completed 09/2022 Diabetic Foot Exam: Overdue, Pt has been advised about the importance in completing this exam. Pt is scheduled for diabetic foot exam on next office visit .   Interpreter Needed?: No  Information entered by :: Jadene Pierini, LPN   Activities of Daily  Living     01/30/2023    2:49 PM  In your present state of health, do you have any difficulty performing the following activities:  Hearing? 0  Vision? 0  Difficulty concentrating or making decisions? 0  Walking or climbing stairs? 0  Dressing or bathing? 0  Doing errands, shopping? 0  Preparing Food and eating ? N  Using the Toilet? N  In the past six months, have you accidently leaked urine? N  Do you have problems with loss of bowel control? N  Managing your Medications? N  Managing your Finances? N  Housekeeping or managing your Housekeeping? N    Patient Care Team: Dettinger, [name], MD as PCP - General (Family Medicine) Satira Sark, MD as PCP - Cardiology (Cardiology) Lavera Guise, [facility] (Pharmacist) Irene Shipper, MD as Consulting Physician (Gastroenterology) Ralene Muskrat as Physician Assistant (Chiropractic Medicine) Celestia Khat, OD (Optometry)  Indicate any recent Medical Services you may have received from other than [facility] providers in the past year (date may be approximate).     Assessment:   This is a routine wellness examination for [place].  Hearing/Vision screen Vision Screening - Comments:: Wears rx glasses - up to date with routine eye exams with  Dr.Johnson   Dietary issues and exercise activities discussed: Current Exercise Habits: Home exercise routine, Type of exercise: walking, Time (Minutes): 60, Frequency (Times/Week): 5, Weekly Exercise (Minutes/Week): 300, Intensity: Mild, Exercise limited by: orthopedic condition(s)   Goals Addressed             This Visit's Progress    DIET - INCREASE WATER INTAKE         Depression Screen    01/30/2023    2:48 PM 01/25/2023    9:52 AM 10/25/2022    9:27 AM 07/26/2022   11:11 AM 06/05/2022   12:21 PM 04/24/2022   10:54 AM 10/12/2021    3:32 PM  PHQ 2/9 Scores  PHQ - 2 Score 0 0 0 0 0 0 0  PHQ- 9 Score 0  0 0       Fall Risk    01/30/2023    2:46 PM 01/25/2023    9:49 AM 10/25/2022    9:27 AM  07/26/2022   11:10 AM 06/05/2022   12:21 PM  Fall Risk   Falls in the past year? 0 1 0 1 1  Number falls in past yr: 0 1  0 0  Injury with Fall? 0 1  1 1   Risk for fall due to : No Fall Risks Impaired balance/gait;Impaired mobility;Orthopedic patient;History of fall(s)  Impaired balance/gait;Orthopedic patient History of fall(s)  Follow up Falls prevention discussed Falls evaluation completed  Falls evaluation completed Falls evaluation completed    [place]:  Any stairs in or around the home? Yes  If so, are there any without handrails? No  Home free of loose throw rugs in walkways, pet beds, electrical cords, etc? Yes  Adequate lighting in your home to reduce risk of falls? Yes   ASSISTIVE DEVICES UTILIZED TO PREVENT FALLS:  Life alert? No  Use of a cane, walker or w/c? Yes  Grab bars in the bathroom? Yes  Shower chair or bench in shower? Yes  Elevated toilet seat or a handicapped toilet? Yes       07/08/2020   10:15 AM  MMSE - Mini Mental State Exam  Orientation to time 5  Orientation to Place 5  Registration 3  Attention/ Calculation 5  Recall 3  Language- name 2 objects 2  Language- repeat 1  Language- follow 3 step command 3  Language- read & follow direction 1  Write a sentence 1  Copy design 1  Total score 30        01/30/2023    2:50 PM  6CIT Screen  What Year? 0 points  What month? 0 points  What time? 0 points  Count back from 20 0 points  Months in reverse 0 points  Repeat phrase 0 points  Total Score 0 points    Immunizations Immunization History  Administered Date(s) Administered   Influenza, Seasonal, Injecte, Preservative Fre 10/15/2015, 08/05/2016   Influenza,inj,Quad PF,6+ Mos 10/15/2015, 08/05/2016, 08/21/2017, 08/07/2018, 07/22/2019, 09/01/2020, 10/12/2021, 07/26/2022   Moderna Sars-Covid-2 Vaccination 05/20/2020, 06/14/2020   Pneumococcal Conjugate-13 07/22/2019, 07/08/2020   Tdap 07/08/2020   Zoster  Recombinat (Shingrix) 06/30/2019, 10/06/2019    TDAP status: Up to date  Flu Vaccine status: Up to date  Pneumococcal vaccine status: Up to date  Covid-19 vaccine status: Completed vaccines  Qualifies for Shingles Vaccine? Yes   Zostavax completed No   Shingrix Completed?: No.    Education has been provided regarding the importance of this vaccine. Patient has been advised to call insurance company to determine out of pocket expense if they have not yet received this vaccine. Advised may also receive vaccine at local pharmacy or Health Dept. Verbalized acceptance and understanding.  Screening Tests Health Maintenance  Topic Date Due   COVID-19 Vaccine (3 - Moderna risk series) 07/12/2020   COLONOSCOPY (Pts 45-28yrs Insurance coverage will need to be confirmed)  08/02/2022   COLON CANCER SCREENING ANNUAL FOBT  04/26/2023 (Originally 03/05/2019)   OPHTHALMOLOGY EXAM  03/23/2023   HEMOGLOBIN A1C  07/28/2023   Diabetic kidney evaluation - eGFR measurement  01/25/2024   Diabetic kidney evaluation - Urine ACR  01/25/2024   Medicare Annual Wellness (AWV)  01/30/2024   DTaP/Tdap/Td (2 - Td or Tdap) 07/08/2030   INFLUENZA VACCINE  Completed   Hepatitis C Screening  Completed   HIV Screening  Completed   Zoster Vaccines- Shingrix  Completed   HPV VACCINES  Aged Out   FOOT EXAM  Discontinued    Health Maintenance  Health Maintenance Due  Topic Date Due   COVID-19 Vaccine (3 - Moderna risk series) 07/12/2020   COLONOSCOPY (Pts 45-66yrs Insurance coverage will need to be confirmed)  08/02/2022    Colorectal cancer screening: Referral to GI placed 01/30/2023. Pt aware the office will call re: appt.  Lung Cancer Screening: (Low Dose CT Chest recommended if Age 7-80 years, 30 pack-year currently smoking OR have quit w/in 15years.) does not qualify.   Lung Cancer Screening Referral: n/a  Additional Screening:  Hepatitis C Screening: does not qualify; Completed 06/19/2016  Vision  Screening: Recommended annual ophthalmology exams for early detection of glaucoma and other disorders of the eye. Is the patient up to date with their annual eye exam?  Yes  Who is the provider or what is the name of the office in which the patient attends annual eye exams? Dr.Johnson  If pt is not established with a provider, would they like to be referred to a provider to establish care? No .   Dental Screening: Recommended annual dental exams for proper oral hygiene  Community Resource Referral / Chronic Care Management: CRR required this visit?  No   CCM required this visit?  No      Plan:  I have personally reviewed and noted the following in the patient's chart:   Medical and social history Use of alcohol, tobacco or illicit drugs  Current medications and supplements including opioid prescriptions. Patient is not currently taking opioid prescriptions. Functional ability and status Nutritional status Physical activity Advanced directives List of other physicians Hospitalizations, surgeries, and ER visits in previous 12 months Vitals Screenings to include cognitive, depression, and falls Referrals and appointments  In addition, I have reviewed and discussed with patient certain preventive protocols, quality metrics, and best practice recommendations. A written personalized care plan for preventive services as well as general preventive health recommendations were provided to patient.     Daphane Shepherd, LPN   [phone]   Nurse Notes: none

## 2023-02-01 ENCOUNTER — Other Ambulatory Visit: Payer: Self-pay | Admitting: Family Medicine

## 2023-02-01 NOTE — Telephone Encounter (Signed)
Pt states that his Tyler Aas is shipped here and Almyra Free calls him when it is ready.  He does not have a shipment here and there are no samples. Pt states that he has enough for next week but would like a call back on Monday on what he should do.  Pt has been approved through pt assistance until 11/06/23.  Informed pt that Dettinger can address on Monday with other options if Almyra Free is not here.

## 2023-02-05 ENCOUNTER — Other Ambulatory Visit: Payer: Self-pay

## 2023-02-05 ENCOUNTER — Emergency Department (HOSPITAL_COMMUNITY)
Admission: EM | Admit: 2023-02-05 | Discharge: 2023-02-05 | Disposition: A | Discharge status: Home / Self Care | Payer: Medicare Other | Attending: Student | Admitting: Student

## 2023-02-05 ENCOUNTER — Emergency Department (HOSPITAL_COMMUNITY): Payer: Medicare Other

## 2023-02-05 DIAGNOSIS — T8744 Infection of amputation stump, left lower extremity: Secondary | ICD-10-CM | POA: Diagnosis not present

## 2023-02-05 DIAGNOSIS — Z89512 Acquired absence of left leg below knee: Secondary | ICD-10-CM | POA: Insufficient documentation

## 2023-02-05 DIAGNOSIS — D72829 Elevated white blood cell count, unspecified: Secondary | ICD-10-CM | POA: Insufficient documentation

## 2023-02-05 DIAGNOSIS — T8789 Other complications of amputation stump: Secondary | ICD-10-CM | POA: Diagnosis not present

## 2023-02-05 DIAGNOSIS — M7989 Other specified soft tissue disorders: Secondary | ICD-10-CM | POA: Diagnosis not present

## 2023-02-05 DIAGNOSIS — L089 Local infection of the skin and subcutaneous tissue, unspecified: Secondary | ICD-10-CM

## 2023-02-05 DIAGNOSIS — Z79899 Other long term (current) drug therapy: Secondary | ICD-10-CM | POA: Insufficient documentation

## 2023-02-05 DIAGNOSIS — I10 Essential (primary) hypertension: Secondary | ICD-10-CM | POA: Insufficient documentation

## 2023-02-05 DIAGNOSIS — Z794 Long term (current) use of insulin: Secondary | ICD-10-CM | POA: Diagnosis not present

## 2023-02-05 DIAGNOSIS — E119 Type 2 diabetes mellitus without complications: Secondary | ICD-10-CM | POA: Diagnosis not present

## 2023-02-05 DIAGNOSIS — Z89511 Acquired absence of right leg below knee: Secondary | ICD-10-CM | POA: Insufficient documentation

## 2023-02-05 DIAGNOSIS — Z7984 Long term (current) use of oral hypoglycemic drugs: Secondary | ICD-10-CM | POA: Diagnosis not present

## 2023-02-05 DIAGNOSIS — Z7901 Long term (current) use of anticoagulants: Secondary | ICD-10-CM | POA: Diagnosis not present

## 2023-02-05 DIAGNOSIS — L03116 Cellulitis of left lower limb: Secondary | ICD-10-CM | POA: Diagnosis not present

## 2023-02-05 LAB — COMPREHENSIVE METABOLIC PANEL
ALT: 17 U/L (ref 0–44)
AST: 12 U/L — ABNORMAL LOW (ref 15–41)
Albumin: 4 g/dL (ref 3.5–5.0)
Alkaline Phosphatase: 155 U/L — ABNORMAL HIGH (ref 38–126)
Anion gap: 10 (ref 5–15)
BUN: 20 mg/dL (ref 6–20)
CO2: 25 mmol/L (ref 22–32)
Calcium: 9.1 mg/dL (ref 8.9–10.3)
Chloride: 98 mmol/L (ref 98–111)
Creatinine, Ser: 0.96 mg/dL (ref 0.61–1.24)
GFR, Estimated: >60 mL/min (ref >60)
Glucose, Bld: 181 mg/dL — ABNORMAL HIGH (ref 70–99)
Potassium: 4.5 mmol/L (ref 3.5–5.1)
Sodium: 133 mmol/L — ABNORMAL LOW (ref 135–145)
Total Bilirubin: 1.1 mg/dL (ref 0.3–1.2)
Total Protein: 7.8 g/dL (ref 6.5–8.1)

## 2023-02-05 LAB — CBC WITH DIFFERENTIAL/PLATELET
Abs Immature Granulocytes: 0.1 10*3/uL — ABNORMAL HIGH (ref 0.00–0.07)
Basophils Absolute: 0.1 10*3/uL (ref 0.0–0.1)
Basophils Relative: 0 %
Eosinophils Absolute: 0 10*3/uL (ref 0.0–0.5)
Eosinophils Relative: 0 %
HCT: 49.7 % (ref 39.0–52.0)
Hemoglobin: 16.9 g/dL (ref 13.0–17.0)
Immature Granulocytes: 1 %
Lymphocytes Relative: 16 %
Lymphs Abs: 2.2 10*3/uL (ref 0.7–4.0)
MCH: 31.1 pg (ref 26.0–34.0)
MCHC: 34 g/dL (ref 30.0–36.0)
MCV: 91.4 fL (ref 80.0–100.0)
Monocytes Absolute: 1.1 10*3/uL — ABNORMAL HIGH (ref 0.1–1.0)
Monocytes Relative: 8 %
Neutro Abs: 10.5 10*3/uL — ABNORMAL HIGH (ref 1.7–7.7)
Neutrophils Relative %: 75 %
Platelets: 232 10*3/uL (ref 150–400)
RBC: 5.44 MIL/uL (ref 4.22–5.81)
RDW: 12.6 % (ref 11.5–15.5)
WBC: 14 10*3/uL — ABNORMAL HIGH (ref 4.0–10.5)
nRBC: 0 % (ref 0.0–0.2)

## 2023-02-05 LAB — CBG MONITORING, ED: Glucose-Capillary: 204 mg/dL — ABNORMAL HIGH (ref 70–99)

## 2023-02-05 LAB — LACTIC ACID, PLASMA: Lactic Acid, Venous: 1.2 mmol/L (ref 0.5–1.9)

## 2023-02-05 MED ORDER — CEFADROXIL 500 MG PO CAPS: 500.0000 mg | ORAL_CAPSULE | Freq: Two times a day (BID) | ORAL | 0 refills | Status: AC

## 2023-02-05 MED ORDER — CEFADROXIL 500 MG PO CAPS
500.0000 mg | ORAL_CAPSULE | Freq: Once | ORAL | Status: AC
  Administered 2023-02-05: 500 mg via ORAL
  Filled 2023-02-05: qty 1

## 2023-02-05 NOTE — ED Provider Triage Note (Signed)
Emergency Medicine Provider Triage Evaluation Note  [name] , a 57 y.o. male  was evaluated in triage.  Pt complains of left leg wound.  History of diabetes status post bilateral BKA's.  States that he has a wound on his left stump that is has been becoming increasingly more painful and draining clear fluid.  Denies fevers or chills.  Review of Systems  Positive: Negative:   Physical Exam  BP (!) 162/97   Pulse 100   Temp 98.7 F (37.1 C)   Resp 16   Ht 6\' 2"  (1.88 m)   Wt 96.2 kg   SpO2 97%   BMI 27.22 kg/m  Gen:   Awake, no distress   Resp:  Normal effort  MSK:   Moves extremities without difficulty  Other:  Wound on the end of the patients BKA draining clear fluid with surrounding erythema  Medical Decision Making  Medically screening exam initiated at 3:38 PM.  Appropriate orders placed.  [name] was informed that the remainder of the evaluation will be completed by another provider, this initial triage assessment does not replace that evaluation, and the importance of remaining in the ED until their evaluation is complete.     Anthony Face, PA-C 02/05/23 1540

## 2023-02-05 NOTE — ED Triage Notes (Signed)
Pt has wound on bottom of Left BKA     draining clear drainage.  No order

## 2023-02-05 NOTE — Telephone Encounter (Signed)
Found a sample for tresiba, please have him come and get and then he needs to talk to Almyra Free and figure his medicine out.

## 2023-02-06 ENCOUNTER — Telehealth: Payer: Self-pay

## 2023-02-06 ENCOUNTER — Ambulatory Visit: Payer: Medicare Other

## 2023-02-06 NOTE — Telephone Encounter (Signed)
Patient is calling to inquire about when he will be receiving Toujeo from patient assistance.  Please advise.

## 2023-02-06 NOTE — Telephone Encounter (Signed)
Patient informed we have received a box of Ozempic and it will be placed in the refrigerator for pick up.

## 2023-02-06 NOTE — ED Provider Notes (Signed)
[place] Provider Note  CSN: [id number] Arrival date & time: 02/05/23 1424  Chief Complaint(s) Wound Check  HPI [name] is a 57 y.o. male with PMH osteomyelitis status post bilateral BKA, paroxysmal A-fib on Eliquis, T2DM who presents emergency department for evaluation of a wound check.  Patient states that over the last few days he has started to develop some mild worsening pain to the stump on the left and has noticed an open wound with some clear serous drainage.  He denies nausea, vomiting, fever, chest pain, shortness of breath or other associated symptoms.  He has been using a pile of large Band-Aids for barrier protection in this area and then applying an over-the-counter cream.   Past Medical History Past Medical History:  Diagnosis Date   Charcot ankle    Essential hypertension    Osteomyelitis of toe of right foot (HCC)    Paroxysmal atrial fibrillation (HCC)    Recurrent boils    Type 2 diabetes mellitus with diabetic neuropathy [facility])    Diagnosed age 65   Patient Active Problem List   Diagnosis Date Noted   Dyslipidemia 07/14/2021   S/P BKA (below knee amputation) unilateral, right 02/19/2020   Osteomyelitis of ankle and foot    Abscess of tendon sheath, right ankle and foot 12/24/2019   Bacteremia due to methicillin susceptible Staphylococcus aureus (MSSA) 12/24/2019   History of DVT (deep vein thrombosis) 10/26/2019   A-fib 10/22/2019   Lumbar spine scoliosis 08/13/2018   S/P BKA (below knee amputation) unilateral, left 01/12/2018   Pain management contract signed 05/04/2017   Type 2 diabetes mellitus 07/22/2015   Neuropathy in diabetes 03/31/2014   Essential hypertension, benign 03/03/2013   DDD (degenerative disc disease), lumbar 03/03/2013   Home Medication(s) Prior to Admission medications   Medication Sig Start Date End Date Taking? Authorizing Provider  cefadroxil (DURICEF) 500 MG capsule Take 1  capsule (500 mg total) by mouth 2 (two) times daily for 7 days. 02/05/23 02/12/23 Yes Chayse Gracey, MD  apixaban (ELIQUIS) 5 MG TABS tablet Take 1 tablet (5 mg total) by mouth 2 (two) times daily. 11/16/22   Satira Sark, MD  atorvastatin (LIPITOR) 20 MG tablet TAKE 1 TABLET BY MOUTH EVERY DAY 12/11/22   Dettinger, [name], MD  benzonatate (TESSALON PERLES) 100 MG capsule Take 1 capsule (100 mg total) by mouth 3 (three) times daily as needed for cough. Patient not taking: Reported on 01/30/2023 12/12/22   Baruch Gouty, FNP  Continuous Blood Gluc Receiver (FREESTYLE LIBRE 2 READER) DEVI Use as directed to test blood sugar up to 6 times daily as directed. DX: E 11.9 09/22/21   Dettinger, [name], MD  Continuous Blood Gluc Sensor (FREESTYLE LIBRE 2 SENSOR) MISC Use as directed to test blood sugar up to 6 times daily as directed. DX: E 11.9 09/22/21   Dettinger, [name], MD  DULoxetine (CYMBALTA) 60 MG capsule Take 1 capsule (60 mg total) by mouth daily. 04/24/22   Dettinger, [name], MD  fluticasone (FLONASE) 50 MCG/ACT nasal spray Place 1 spray into both nostrils 2 (two) times daily as needed for allergies or rhinitis. 01/08/19   Dettinger, [name], MD  gabapentin (NEURONTIN) 600 MG tablet Take 1 tablet (600 mg total) by mouth 3 (three) times daily. 04/24/22   Dettinger, [name], MD  HYDROcodone-acetaminophen (NORCO) 10-325 MG tablet Take 1 tablet by mouth 2 (two) times daily as needed. 02/24/23   Dettinger,  [name], MD  HYDROcodone-acetaminophen (NORCO) 10-325 MG tablet Take 1 tablet by mouth 2 (two) times daily as needed. 01/25/23   Dettinger, [name], MD  HYDROcodone-acetaminophen (NORCO) 10-325 MG tablet Take 1 tablet by mouth 2 (two) times daily as needed. 02/24/23   Dettinger, [name], MD  insulin aspart (NOVOLOG FLEXPEN) 100 UNIT/ML FlexPen Inject 5-10 Units into the skin 3 (three) times daily with meals. 11/21/22   Dettinger, [name], MD  insulin degludec (TRESIBA FLEXTOUCH) 100 UNIT/ML  FlexTouch Pen Inject 72-74 Units into the skin daily.    [provider]  metFORMIN (GLUCOPHAGE) 1000 MG tablet Take 1 tablet (1,000 mg total) by mouth 2 (two) times daily. 04/24/22   Dettinger, [name], MD  metoprolol succinate (TOPROL-XL) 25 MG 24 hr tablet TAKE 1 TABLET BY MOUTH EVERY DAY 03/17/22   Satira Sark, MD  Multiple Vitamins-Minerals (MULTIVITAMIN WITH MINERALS) tablet Take 1 tablet by mouth daily.    [provider]  Semaglutide, 2 MG/DOSE, 8 MG/3ML SOPN Inject 2 mg as directed once a week. 11/21/22   Dettinger, [name], MD                                                                                                                                    Past Surgical History Past Surgical History:  Procedure Laterality Date   AMPUTATION Right 06/17/2014   Procedure: PARTIAL AMPUTATION RIGHT 3RD TOE;  Surgeon: Marcheta Grammes, DPM;  Location: [facility];  Service: Podiatry;  Laterality: Right;   AMPUTATION Right 09/02/2014   Procedure: PARTIAL AMPUTATION 2ND TOE RIGHT FOOT;  Surgeon: Marcheta Grammes, DPM;  Location: [facility];  Service: Podiatry;  Laterality: Right;   AMPUTATION Right 04/01/2015   Procedure: AMPUTATION DIGIT 1ST TOE RIGHT FOOT;  Surgeon: Juanita Laster, DPM;  Location: [facility];  Service: Podiatry;  Laterality: Right;   AMPUTATION Right 06/29/2016   pinkeye    AMPUTATION Right 10/24/2019   Procedure: PARTIAL THIRD RAY AMPUTATION;  Surgeon: [name], DPM;  Location: [facility];  Service: Podiatry;  Laterality: Right;   AMPUTATION Right 02/19/2020   Procedure: AMPUTATION BELOW KNEE; RIGHT;  Surgeon: Aviva Signs, MD;  Location: [facility];  Service: General;  Laterality: Right;   FOOT AMPUTATION Left    FOOT SURGERY Left 03/2017   HERNIA REPAIR Bilateral    and 3rd hernia repair does not remember ehat side   IRRIGATION AND DEBRIDEMENT ABSCESS Right 11/05/2019   Procedure: IRRIGATION AND DRAINAGE ABSCESS RIGHT ANKLE;  Surgeon: [name], DPM;  Location: [facility];  Service: Podiatry;  Laterality: Right;   TEE WITHOUT CARDIOVERSION N/A 10/29/2019   Procedure: TRANSESOPHAGEAL ECHOCARDIOGRAM (TEE) WITH PROPOFOL;  Surgeon: Fay Records, MD;  Location: [facility];  Service: Cardiovascular;  Laterality: N/A;   Family History Family History  Problem Relation Age of Onset   Diabetes Mother    Stroke Father  Asthma Father    COPD Father    Aneurysm Father        AAA   Diabetes Brother    Stroke Brother        2008   Colon cancer Neg Hx    Heart disease Neg Hx    Rectal cancer Neg Hx    Stomach cancer Neg Hx     Social History Social History   Tobacco Use   Smoking status: Never   Smokeless tobacco: Never  Vaping Use   Vaping Use: Never used  Substance Use Topics   Alcohol use: No   Drug use: No   Allergies Patient has no known allergies.  Review of Systems Review of Systems  Skin:  Positive for wound.    Physical Exam Vital Signs  I have reviewed the triage vital signs BP (!) 140/80 (BP Location: Right Arm)   Pulse 80   Temp 98 F (36.7 C) (Oral)   Resp 18   Ht 6\' 2"  (1.88 m)   Wt 96.2 kg   SpO2 99%   BMI 27.22 kg/m   Physical Exam Constitutional:      General: He is not in acute distress.    Appearance: Normal appearance.  HENT:     Head: Normocephalic and atraumatic.     Nose: No congestion or rhinorrhea.  Eyes:     General:        Right eye: No discharge.        Left eye: No discharge.     Extraocular Movements: Extraocular movements intact.     Pupils: Pupils are equal, round, and reactive to light.  Cardiovascular:     Rate and Rhythm: Normal rate and regular rhythm.     Heart sounds: No murmur heard. Pulmonary:     Effort: No respiratory distress.     Breath sounds: No wheezing or rales.  Abdominal:     General: There is no distension.     Tenderness: There is no abdominal tenderness.  Musculoskeletal:        General: Normal range of motion.     Cervical back:  Normal range of motion.  Skin:    General: Skin is warm and dry.     Findings: Lesion present.  Neurological:     General: No focal deficit present.     Mental Status: He is alert.     ED Results and Treatments Labs (all labs ordered are listed, but only abnormal results are displayed) Labs Reviewed  COMPREHENSIVE METABOLIC PANEL - Abnormal; Notable for the following components:      Result Value   Sodium 133 (*)    Glucose, Bld 181 (*)    AST 12 (*)    Alkaline Phosphatase 155 (*)    All other components within normal limits  CBC WITH DIFFERENTIAL/PLATELET - Abnormal; Notable for the following components:   WBC 14.0 (*)    Neutro Abs 10.5 (*)    Monocytes Absolute 1.1 (*)    Abs Immature Granulocytes 0.10 (*)    All other components within normal limits  CBG MONITORING, ED - Abnormal; Notable for the following components:   Glucose-Capillary 204 (*)    All other components within normal limits  CULTURE, BLOOD (ROUTINE X 2)  CULTURE, BLOOD (ROUTINE X 2)  LACTIC ACID, PLASMA  Radiology DG Tibia/Fibula Left  Result Date: 02/05/2023 CLINICAL DATA:  Prior below-knee amputation, sore at bottom of stump EXAM: LEFT TIBIA AND FIBULA - 2 VIEW COMPARISON:  None Available. FINDINGS: Osseous demineralization. Prior LEFT BKA. Minimal narrowing at medial compartment LEFT knee. No acute fracture, dislocation, or bone destruction. Mild soft tissue swelling at anterior stump without soft tissue gas identified. IMPRESSION: Soft tissue swelling without acute bony abnormalities or soft tissue gas. Electronically Signed   By: Lavonia Dana M.D.   On: 02/05/2023 16:02    Pertinent labs & imaging results that were available during my care of the patient were reviewed by me and considered in my medical decision making (see MDM for details).  Medications Ordered in ED Medications   cefadroxil (DURICEF) capsule 500 mg (500 mg Oral Given 02/05/23 1824)                                                                                                                                     Procedures Procedures  (including critical care time)  Medical Decision Making / ED Course   This patient presents to the ED for concern of wound check, this involves an extensive number of treatment options, and is a complaint that carries with it a high risk of complications and morbidity.  The differential diagnosis includes wound infection, cellulitis, local irritation, osteomyelitis  MDM: Patient seen emergency room for evaluation of wound check.  Physical exam reveals a 1.5 cm open wound to the tip of the stump on the left that appears to have healthy granulation tissue with serous drainage and some mild surrounding erythema that is mildly tender to palpation.  Laboratory evaluation with leukocytosis of 14.0 but is otherwise unremarkable including a negative lactic acid.  X-ray reassuring.  Blood cultures were obtained.  Patient presentation may be consistent with a mild developing cellulitis but the wound does not appear grossly infected.  Given his high risk history and decreased sensation in this area, we will be cautious and cover with antibiotics today and patient will follow-up outpatient with his primary care physician.  Patient will require outpatient wound evaluation and we attempted to provide appropriate care protection in the ER.  Patient covered with [facility] discharge with outpatient follow-up.  Patient given strict return precautions of which he was understanding   Additional history obtained: -Additional history obtained from wife -External records from outside source obtained and reviewed including: Chart review including previous notes, labs, imaging, consultation notes   Lab Tests: -I ordered, reviewed, and interpreted labs.   The pertinent results include:   Labs  Reviewed  COMPREHENSIVE METABOLIC PANEL - Abnormal; Notable for the following components:      Result Value   Sodium 133 (*)    Glucose, Bld 181 (*)    AST 12 (*)    Alkaline Phosphatase 155 (*)    All other components within normal limits  CBC WITH DIFFERENTIAL/PLATELET -  Abnormal; Notable for the following components:   WBC 14.0 (*)    Neutro Abs 10.5 (*)    Monocytes Absolute 1.1 (*)    Abs Immature Granulocytes 0.10 (*)    All other components within normal limits  CBG MONITORING, ED - Abnormal; Notable for the following components:   Glucose-Capillary 204 (*)    All other components within normal limits  CULTURE, BLOOD (ROUTINE X 2)  CULTURE, BLOOD (ROUTINE X 2)  LACTIC ACID, PLASMA         Imaging Studies ordered: I ordered imaging studies including x-ray tib-fib I independently visualized and interpreted imaging. I agree with the radiologist interpretation   Medicines ordered and prescription drug management: Meds ordered this encounter  Medications   cefadroxil (DURICEF) capsule 500 mg   cefadroxil (DURICEF) 500 MG capsule    Sig: Take 1 capsule (500 mg total) by mouth 2 (two) times daily for 7 days.    Dispense:  14 capsule    Refill:  0    -I have reviewed the patients home medicines and have made adjustments as needed  Critical interventions none    Cardiac Monitoring: The patient was maintained on a cardiac monitor.  I personally viewed and interpreted the cardiac monitored which showed an underlying rhythm of: NSR  Social Determinants of Health:  Factors impacting patients care include: none   Reevaluation: After the interventions noted above, I reevaluated the patient and found that they have :improved  Co morbidities that complicate the patient evaluation  Past Medical History:  Diagnosis Date   Charcot ankle    Essential hypertension    Osteomyelitis of toe of right foot (HCC)    Paroxysmal atrial fibrillation (HCC)    Recurrent boils     Type 2 diabetes mellitus with diabetic neuropathy ([place])    Diagnosed age 78      Dispostion: I considered admission for this patient, but at this time he does not meet inpatient criteria for admission he is safe for discharge with outpatient PCP and wound care follow-up     Final Clinical Impression(s) / ED Diagnoses Final diagnoses:  Wound infection     @PCDICTATION @    Teressa Lower, MD 02/06/23 1126

## 2023-02-07 NOTE — Telephone Encounter (Signed)
I will f/u with patient tomorrow AM He gets tresiba and ozempic via [employer] patient assistance Some shipments have been delayed PCP gave samples yesterday

## 2023-02-08 ENCOUNTER — Ambulatory Visit: Payer: Medicare Other

## 2023-02-09 ENCOUNTER — Telehealth: Payer: Self-pay

## 2023-02-09 NOTE — Telephone Encounter (Signed)
     Patient  visit on 02/05/2023  at [facility] was for wound infection.  Have you been able to follow up with your primary care physician? Yes  The patient was or was not able to obtain any needed medicine or equipment. Patient was able to obtain medication.  Are there diet recommendations that you are having difficulty following? No  Patient expresses understanding of discharge instructions and education provided has no other needs at this time. Yes   [name] [name]  [name] Population Health Community Resource Care Guide   [name]  ?? [phone]   Website: [email or web address]  [name]

## 2023-02-10 LAB — CULTURE, BLOOD (ROUTINE X 2)
Culture: NO GROWTH
Culture: NO GROWTH
Special Requests: ADEQUATE
Special Requests: ADEQUATE

## 2023-02-20 ENCOUNTER — Encounter: Payer: Self-pay | Admitting: Pharmacist

## 2023-02-20 ENCOUNTER — Ambulatory Visit: Payer: Medicare Other | Admitting: Nurse Practitioner

## 2023-02-20 ENCOUNTER — Ambulatory Visit (INDEPENDENT_AMBULATORY_CARE_PROVIDER_SITE_OTHER): Payer: Medicare Other | Admitting: Pharmacist

## 2023-02-20 VITALS — BP 130/75 | HR 69

## 2023-02-20 DIAGNOSIS — E1122 Type 2 diabetes mellitus with diabetic chronic kidney disease: Secondary | ICD-10-CM

## 2023-02-20 DIAGNOSIS — Z794 Long term (current) use of insulin: Secondary | ICD-10-CM | POA: Diagnosis not present

## 2023-02-20 NOTE — Progress Notes (Signed)
       02/20/2023 Name: [name]  MRN: [id number]       DOB: [birth year]     S:  56yoM Presents for diabetes evaluation, education, and management Patient was referred and last seen by Primary Care Provider on 01/25/23   Insurance coverage/medication affordability: [facility] MEDICARE   Patient reports adherence with medications. Current diabetes medications include: tresiba, ozempic, metformin Current hypertension medications include: metoprolol Goal 130/80 Current hyperlipidemia medications include: atorvastatin   Patient denies hypoglycemic events.   Patient reported dietary habits: Eats 3 meals/day   Patient-reported exercise habits: encouraged as able     O:   Recent Labs       Lab Results  Component Value Date    HGBA1C 8.1 (H) 10/25/2022      Lipid Panel   Labs (Brief)          Component Value Date/Time    CHOL 71 (L) 10/25/2022 0927    TRIG 120 10/25/2022 0927    HDL 26 (L) 10/25/2022 0927    CHOLHDL 2.7 10/25/2022 0927    CHOLHDL 4.6 01/30/2018 1347    VLDL 54 (H) 01/30/2018 1347    LDLCALC 23 10/25/2022 0927         Home fasting blood sugars: <145; patient REPORTS ALL LIBRE READINGS ARE THE IN GREEN   2 hour post-meal/random blood sugars: n/a     Clinical Atherosclerotic Cardiovascular Disease (ASCVD): No    The ASCVD Risk score (Arnett DK, et al., 2019) failed to calculate for the following reasons:   The valid total cholesterol range is 130 to 320 mg/dL     A/P:   Uncontrolled (A1C 8.1%)   Current treatment:  CONTINUE TRESIBA 72-74 UNITS DAILY, CONTINUE OZEMPIC TO  SQ WEEKLY INCREASE NOVOLOG TO 15-20 UNITS WITH MEALS 3 TIMES DAILY CONTINUE METFORMIN   -Reminded patient of supply via patient assistance that is ready for pick up  -Extensively discussed pathophysiology of diabetes, recommended lifestyle interventions, dietary effects on blood sugar control   -Counseled on s/sx of and management of hypoglycemia   -Next A1C anticipated  3 months.   Written patient instructions provided.  Total time in face to face counseling 25 minutes.        Kieth Brightly, PharmD, BCPS, BCACP Clinical Pharmacist, [facility]  II  T [phone]

## 2023-03-07 NOTE — Telephone Encounter (Signed)
Eliquis samples given to patient  He has tresiba, Technical sales engineer and ozempic in stock at home via [employer] patient assistance program

## 2023-03-20 ENCOUNTER — Telehealth: Payer: Self-pay | Admitting: Family Medicine

## 2023-03-20 NOTE — Telephone Encounter (Signed)
Apt made first available

## 2023-03-21 ENCOUNTER — Ambulatory Visit: Payer: Medicare Other | Admitting: Nurse Practitioner

## 2023-03-22 ENCOUNTER — Ambulatory Visit: Payer: Medicare Other | Admitting: Family Medicine

## 2023-03-22 ENCOUNTER — Emergency Department (HOSPITAL_COMMUNITY): Payer: Medicare Other

## 2023-03-22 ENCOUNTER — Encounter (HOSPITAL_COMMUNITY): Payer: Self-pay | Admitting: *Deleted

## 2023-03-22 ENCOUNTER — Inpatient Hospital Stay (HOSPITAL_COMMUNITY)
Admission: EM | Admit: 2023-03-22 | Discharge: 2023-03-24 | DRG: 565 | Disposition: A | Discharge status: Home / Self Care | Payer: Medicare Other | Attending: Internal Medicine | Admitting: Internal Medicine

## 2023-03-22 ENCOUNTER — Other Ambulatory Visit: Payer: Self-pay

## 2023-03-22 DIAGNOSIS — E785 Hyperlipidemia, unspecified: Secondary | ICD-10-CM | POA: Diagnosis not present

## 2023-03-22 DIAGNOSIS — M8618 Other acute osteomyelitis, other site: Secondary | ICD-10-CM | POA: Diagnosis not present

## 2023-03-22 DIAGNOSIS — I480 Paroxysmal atrial fibrillation: Secondary | ICD-10-CM | POA: Diagnosis not present

## 2023-03-22 DIAGNOSIS — E1165 Type 2 diabetes mellitus with hyperglycemia: Secondary | ICD-10-CM | POA: Diagnosis not present

## 2023-03-22 DIAGNOSIS — E1122 Type 2 diabetes mellitus with diabetic chronic kidney disease: Secondary | ICD-10-CM

## 2023-03-22 DIAGNOSIS — L02818 Cutaneous abscess of other sites: Secondary | ICD-10-CM | POA: Diagnosis not present

## 2023-03-22 DIAGNOSIS — Z7985 Long-term (current) use of injectable non-insulin antidiabetic drugs: Secondary | ICD-10-CM

## 2023-03-22 DIAGNOSIS — E878 Other disorders of electrolyte and fluid balance, not elsewhere classified: Secondary | ICD-10-CM | POA: Diagnosis not present

## 2023-03-22 DIAGNOSIS — Z7901 Long term (current) use of anticoagulants: Secondary | ICD-10-CM | POA: Diagnosis not present

## 2023-03-22 DIAGNOSIS — S81802A Unspecified open wound, left lower leg, initial encounter: Secondary | ICD-10-CM

## 2023-03-22 DIAGNOSIS — Z794 Long term (current) use of insulin: Secondary | ICD-10-CM

## 2023-03-22 DIAGNOSIS — I10 Essential (primary) hypertension: Secondary | ICD-10-CM | POA: Diagnosis not present

## 2023-03-22 DIAGNOSIS — E1169 Type 2 diabetes mellitus with other specified complication: Secondary | ICD-10-CM | POA: Diagnosis not present

## 2023-03-22 DIAGNOSIS — Z823 Family history of stroke: Secondary | ICD-10-CM | POA: Diagnosis not present

## 2023-03-22 DIAGNOSIS — Y835 Amputation of limb(s) as the cause of abnormal reaction of the patient, or of later complication, without mention of misadventure at the time of the procedure: Secondary | ICD-10-CM | POA: Diagnosis present

## 2023-03-22 DIAGNOSIS — T8744 Infection of amputation stump, left lower extremity: Secondary | ICD-10-CM | POA: Diagnosis not present

## 2023-03-22 DIAGNOSIS — Z89512 Acquired absence of left leg below knee: Secondary | ICD-10-CM

## 2023-03-22 DIAGNOSIS — R600 Localized edema: Secondary | ICD-10-CM | POA: Diagnosis not present

## 2023-03-22 DIAGNOSIS — E1142 Type 2 diabetes mellitus with diabetic polyneuropathy: Secondary | ICD-10-CM | POA: Diagnosis not present

## 2023-03-22 DIAGNOSIS — M869 Osteomyelitis, unspecified: Secondary | ICD-10-CM | POA: Diagnosis present

## 2023-03-22 DIAGNOSIS — Z825 Family history of asthma and other chronic lower respiratory diseases: Secondary | ICD-10-CM

## 2023-03-22 DIAGNOSIS — Z79899 Other long term (current) drug therapy: Secondary | ICD-10-CM | POA: Diagnosis not present

## 2023-03-22 DIAGNOSIS — I4891 Unspecified atrial fibrillation: Secondary | ICD-10-CM | POA: Diagnosis present

## 2023-03-22 DIAGNOSIS — Z833 Family history of diabetes mellitus: Secondary | ICD-10-CM | POA: Diagnosis not present

## 2023-03-22 DIAGNOSIS — E11610 Type 2 diabetes mellitus with diabetic neuropathic arthropathy: Secondary | ICD-10-CM | POA: Diagnosis present

## 2023-03-22 DIAGNOSIS — E119 Type 2 diabetes mellitus without complications: Secondary | ICD-10-CM

## 2023-03-22 DIAGNOSIS — E871 Hypo-osmolality and hyponatremia: Secondary | ICD-10-CM | POA: Diagnosis present

## 2023-03-22 DIAGNOSIS — Z89511 Acquired absence of right leg below knee: Secondary | ICD-10-CM

## 2023-03-22 DIAGNOSIS — I152 Hypertension secondary to endocrine disorders: Secondary | ICD-10-CM | POA: Diagnosis present

## 2023-03-22 DIAGNOSIS — E1140 Type 2 diabetes mellitus with diabetic neuropathy, unspecified: Secondary | ICD-10-CM | POA: Diagnosis present

## 2023-03-22 DIAGNOSIS — Z7984 Long term (current) use of oral hypoglycemic drugs: Secondary | ICD-10-CM

## 2023-03-22 LAB — CBC WITH DIFFERENTIAL/PLATELET
Abs Immature Granulocytes: 0.04 10*3/uL (ref 0.00–0.07)
Basophils Absolute: 0 10*3/uL (ref 0.0–0.1)
Basophils Relative: 1 %
Eosinophils Absolute: 0 10*3/uL (ref 0.0–0.5)
Eosinophils Relative: 0 %
HCT: 44.3 % (ref 39.0–52.0)
Hemoglobin: 15.2 g/dL (ref 13.0–17.0)
Immature Granulocytes: 1 %
Lymphocytes Relative: 12 %
Lymphs Abs: 1.1 10*3/uL (ref 0.7–4.0)
MCH: 31 pg (ref 26.0–34.0)
MCHC: 34.3 g/dL (ref 30.0–36.0)
MCV: 90.4 fL (ref 80.0–100.0)
Monocytes Absolute: 0.7 10*3/uL (ref 0.1–1.0)
Monocytes Relative: 8 %
Neutro Abs: 7 10*3/uL (ref 1.7–7.7)
Neutrophils Relative %: 78 %
Platelets: 214 10*3/uL (ref 150–400)
RBC: 4.9 MIL/uL (ref 4.22–5.81)
RDW: 12.4 % (ref 11.5–15.5)
WBC: 8.8 10*3/uL (ref 4.0–10.5)
nRBC: 0 % (ref 0.0–0.2)

## 2023-03-22 LAB — BASIC METABOLIC PANEL
Anion gap: 10 (ref 5–15)
BUN: 18 mg/dL (ref 6–20)
CO2: 27 mmol/L (ref 22–32)
Calcium: 8.7 mg/dL — ABNORMAL LOW (ref 8.9–10.3)
Chloride: 94 mmol/L — ABNORMAL LOW (ref 98–111)
Creatinine, Ser: 1.03 mg/dL (ref 0.61–1.24)
GFR, Estimated: >60 mL/min (ref >60)
Glucose, Bld: 366 mg/dL — ABNORMAL HIGH (ref 70–99)
Potassium: 4.6 mmol/L (ref 3.5–5.1)
Sodium: 131 mmol/L — ABNORMAL LOW (ref 135–145)

## 2023-03-22 MED ORDER — METFORMIN HCL 500 MG PO TABS: 1000.0000 mg | ORAL_TABLET | Freq: Two times a day (BID) | ORAL | Status: DC

## 2023-03-22 MED ORDER — GADOBUTROL 1 MMOL/ML IV SOLN
10.0000 mL | Freq: Once | INTRAVENOUS | Status: AC | PRN
  Administered 2023-03-22: 10 mL via INTRAVENOUS

## 2023-03-22 MED ORDER — APIXABAN 5 MG PO TABS: 5.0000 mg | ORAL_TABLET | Freq: Two times a day (BID) | ORAL | Status: DC

## 2023-03-22 MED ORDER — VANCOMYCIN HCL 1500 MG/300ML IV SOLN
1500.0000 mg | Freq: Once | INTRAVENOUS | Status: DC
  Filled 2023-03-22: qty 300

## 2023-03-22 MED ORDER — SODIUM CHLORIDE 0.9 % IV SOLN
2.0000 g | Freq: Once | INTRAVENOUS | Status: AC
  Administered 2023-03-23: 2 g via INTRAVENOUS
  Filled 2023-03-22: qty 12.5

## 2023-03-22 MED ORDER — GABAPENTIN 300 MG PO CAPS: 600.0000 mg | ORAL_CAPSULE | Freq: Every day | ORAL | Status: DC

## 2023-03-22 MED ORDER — OXYCODONE HCL 5 MG PO TABS
5.0000 mg | ORAL_TABLET | Freq: Once | ORAL | Status: AC
  Administered 2023-03-22: 5 mg via ORAL
  Filled 2023-03-22: qty 1

## 2023-03-22 NOTE — ED Notes (Signed)
ED Provider at bedside. 

## 2023-03-22 NOTE — ED Provider Notes (Cosign Needed Addendum)
Physical Exam  BP 106/72 (BP Location: Left Arm)   Pulse 92   Temp 98.2 F (36.8 C) (Oral)   Resp 16   Wt 97.5 kg   SpO2 98%   BMI 27.60 kg/m   Physical Exam Vitals and nursing note reviewed.  Constitutional:      General: He is not in acute distress.    Appearance: He is well-developed.  HENT:     Head: Normocephalic and atraumatic.  Eyes:     Conjunctiva/sclera: Conjunctivae normal.  Cardiovascular:     Rate and Rhythm: Normal rate and regular rhythm.     Heart sounds: No murmur heard. Pulmonary:     Effort: Pulmonary effort is normal. No respiratory distress.     Breath sounds: Normal breath sounds.  Abdominal:     Palpations: Abdomen is soft.     Tenderness: There is no abdominal tenderness.  Musculoskeletal:        General: No swelling.     Cervical back: Neck supple.     Comments: Status post bilateral BKA.  Left BKA site with small opening, mild erythema and induration.  Serosanguineous drainage.  No crepitus, obvious purulence.  Skin:    General: Skin is warm and dry.     Capillary Refill: Capillary refill takes less than 2 seconds.  Neurological:     Mental Status: He is alert.  Psychiatric:        Mood and Affect: Mood normal.     Procedures  Procedures  ED Course / MDM   Clinical Course as of 03/23/23 0028  Thu Mar 22, 2023  1459 I spoke with the patient and significant other at the bedside about the labs and imaging findings.  Given that there is questionable evidence of possible early osteomyelitis I will likely do send the patient over to [facility] for an MRI as her machine is down at this time.  Patient is in agreement and will likely travel via private vehicle. [CF]  1500 CBC with Differential Normal.  [CF]  1500 Basic metabolic panel(!) Hyponatremia. Mild Hypochloremia. Significantly elevated glucose.  [CF]  1500 DG Tibia/Fibula Left I personally ordered interpreted the study and there is evidence of possible early infection representing  osteomyelitis in the medial compartment. [CF]  1501 I spoke with Dr. Lockie Mola over at [facility] who accepts the transfer. [CF]    Clinical Course User Index [CF] Teressa Lower, PA-C   Medical Decision Making Amount and/or Complexity of Data Reviewed Labs: ordered. Decision-making details documented in ED Course. Radiology: ordered. Decision-making details documented in ED Course.  Risk Prescription drug management. Decision regarding hospitalization.   57 year old male with history of osteomyelitis status post bilateral BKA, A-fib on Eliquis, type 2 diabetes who initially presented to [facility] for wound to the left BKA stump for 2 days that has been draining serous fluid/pus and become more painful.  He was recently treated with antibiotics cefadroxil which was started April 1 and was a 1 week course.  Symptoms resolved initially with this but is since returned over the last few days.  On exam he has possible cellulitis, no signs of necrotizing infection.  Sent here for MRI to evaluate for osteomyelitis.  Labs and pain notable for hyperglycemia to 366 without evidence of DKA, he has not missed any insulin.  CBC they are unremarkable.  Will obtain MRI for evaluation of osteomyelitis.  MRI reviewed, concerning for osteomyelitis, abscess, myositis.  Broad spectrum antibiotics ordered, plan for admission to hospitalist  service for management of acute osteomyelitis.  Patient comfortable with plan.  I discussed the patient with Dr. Carola Frost with orthopedic surgery.  They will see the patient in the morning, recommend keeping n.p.o. for now.      Fulton Reek, MD 03/23/23 Jacinta Shoe    Blane Ohara, MD 03/24/23 2300

## 2023-03-22 NOTE — Discharge Instructions (Addendum)
Please drive over to [facility] emergency department to get an MRI. I have placed the order. I have notified the ED and they know you are coming.

## 2023-03-22 NOTE — ED Provider Notes (Signed)
[place] EMERGENCY DEPARTMENT AT [facility] Provider Note   CSN: [id number] Arrival date & time: 03/22/23  1253     History Chief Complaint  Patient presents with   Leg Pain    OSSAMA NORTHAM is a 57 y.o. male with past medical history of osteomyelitis status post bilateral BKA, paroxysmal A-fib on Eliquis, type 2 diabetes who presents to the emergency department with a left leg wound this been ongoing for 2 days.  Patient states that it has become increasingly more painful and has been draining yellow serous fluid.  Chart review reveals that the patient was seen and evaluated back in April for similar symptoms.  He was treated for early wound infection with antibiotics.  He states at that time after antibiotics his wound completely healed and he was back to his normal activities of daily living.  Over the last couple of days however, this wound has returned.  He denies any fever, chills, sweats, trauma or injury to the leg.  There is N/A friction point of the prosthetic.   Leg Pain      Home Medications Prior to Admission medications   Medication Sig Start Date End Date Taking? Authorizing Provider  apixaban (ELIQUIS) 5 MG TABS tablet Take 1 tablet (5 mg total) by mouth 2 (two) times daily. 11/16/22   Jonelle Sidle, MD  atorvastatin (LIPITOR) 20 MG tablet TAKE 1 TABLET BY MOUTH EVERY DAY 12/11/22   Dettinger, Elige Radon, MD  benzonatate (TESSALON PERLES) 100 MG capsule Take 1 capsule (100 mg total) by mouth 3 (three) times daily as needed for cough. Patient not taking: Reported on 01/30/2023 12/12/22   Sonny Masters, FNP  Continuous Blood Gluc Receiver (FREESTYLE LIBRE 2 READER) DEVI Use as directed to test blood sugar up to 6 times daily as directed. DX: E 11.9 09/22/21   Dettinger, Elige Radon, MD  Continuous Blood Gluc Sensor (FREESTYLE LIBRE 2 SENSOR) MISC Use as directed to test blood sugar up to 6 times daily as directed. DX: E 11.9 09/22/21   Dettinger, Elige Radon, MD   DULoxetine (CYMBALTA) 60 MG capsule Take 1 capsule (60 mg total) by mouth daily. 04/24/22   Dettinger, Elige Radon, MD  fluticasone (FLONASE) 50 MCG/ACT nasal spray Place 1 spray into both nostrils 2 (two) times daily as needed for allergies or rhinitis. 01/08/19   Dettinger, Elige Radon, MD  gabapentin (NEURONTIN) 600 MG tablet Take 1 tablet (600 mg total) by mouth 3 (three) times daily. 04/24/22   Dettinger, Elige Radon, MD  HYDROcodone-acetaminophen (NORCO) 10-325 MG tablet Take 1 tablet by mouth 2 (two) times daily as needed. 02/24/23   Dettinger, Elige Radon, MD  HYDROcodone-acetaminophen (NORCO) 10-325 MG tablet Take 1 tablet by mouth 2 (two) times daily as needed. 01/25/23   Dettinger, Elige Radon, MD  HYDROcodone-acetaminophen (NORCO) 10-325 MG tablet Take 1 tablet by mouth 2 (two) times daily as needed. 02/24/23   Dettinger, Elige Radon, MD  insulin aspart (NOVOLOG FLEXPEN) 100 UNIT/ML FlexPen Inject 5-10 Units into the skin 3 (three) times daily with meals. Patient taking differently: Inject 15-20 Units into the skin 3 (three) times daily with meals. 11/21/22   Dettinger, Elige Radon, MD  insulin degludec (TRESIBA FLEXTOUCH) 100 UNIT/ML FlexTouch Pen Inject 72-74 Units into the skin daily.    [provider]  metFORMIN (GLUCOPHAGE) 1000 MG tablet Take 1 tablet (1,000 mg total) by mouth 2 (two) times daily. 04/24/22   Dettinger, Elige Radon, MD  metoprolol succinate (  TOPROL-XL) 25 MG 24 hr tablet TAKE 1 TABLET BY MOUTH EVERY DAY 03/17/22   Jonelle Sidle, MD  Multiple Vitamins-Minerals (MULTIVITAMIN WITH MINERALS) tablet Take 1 tablet by mouth daily.    [provider]  Semaglutide, 2 MG/DOSE, 8 MG/3ML SOPN Inject 2 mg as directed once a week. 11/21/22   Dettinger, Elige Radon, MD      Allergies    Patient has no known allergies.    Review of Systems   Review of Systems  All other systems reviewed and are negative.   Physical Exam Updated Vital Signs BP 131/81 (BP Location: Right Arm)   Pulse 97    Temp 98.4 F (36.9 C) (Oral)   Resp 16   Wt 97.5 kg   SpO2 94%   BMI 27.60 kg/m  Physical Exam Vitals and nursing note reviewed.  Constitutional:      General: He is not in acute distress.    Appearance: Normal appearance.  HENT:     Head: Normocephalic and atraumatic.  Eyes:     General:        Right eye: No discharge.        Left eye: No discharge.  Cardiovascular:     Comments: Regular rate and rhythm.  S1/S2 are distinct without any evidence of murmur, rubs, or gallops.  Radial pulses are 2+ bilaterally.  Dorsalis pedis pulses are 2+ bilaterally.  No evidence of pedal edema. Pulmonary:     Comments: Clear to auscultation bilaterally.  Normal effort.  No respiratory distress.  No evidence of wheezes, rales, or rhonchi heard throughout. Abdominal:     General: Abdomen is flat. Bowel sounds are normal. There is no distension.     Tenderness: There is no abdominal tenderness. There is no guarding or rebound.  Musculoskeletal:        General: Normal range of motion.     Cervical back: Neck supple.     Comments: Bilateral BKA.  There is a small punctate wound at the left stump with surrounding healthy granulation tissue.  There is a scant amount of serous fluid coming from the wound.  Surrounding area is not warm or tender to palpation.  Minimal surrounding erythema.  Skin:    General: Skin is warm and dry.     Findings: No rash.  Neurological:     General: No focal deficit present.     Mental Status: He is alert.  Psychiatric:        Mood and Affect: Mood normal.        Behavior: Behavior normal.     ED Results / Procedures / Treatments   Labs (all labs ordered are listed, but only abnormal results are displayed) Labs Reviewed  BASIC METABOLIC PANEL - Abnormal; Notable for the following components:      Result Value   Sodium 131 (*)    Chloride 94 (*)    Glucose, Bld 366 (*)    Calcium 8.7 (*)    All other components within normal limits  CBC WITH  DIFFERENTIAL/PLATELET    EKG None  Radiology DG Tibia/Fibula Left  Result Date: 03/22/2023 CLINICAL DATA:  BKA wound EXAM: LEFT TIBIA AND FIBULA - 2 VIEW COMPARISON:  February 05, 2023 FINDINGS: Osteopenia. Status post below the knee amputation. There is slightly increased lucency and irregularity along the medial distal tibial surgical margin cortex. Smooth surgical margins of the distal fibula. No acute fracture or dislocation. No soft tissue air. IMPRESSION: Slightly increased lucency and irregularity  along the medial distal tibial cortex at the surgical margin. This is nonspecific but could reflect early osteomyelitis. Electronically Signed   By: Meda Klinefelter M.D.   On: 03/22/2023 14:23    Procedures Procedures    Medications Ordered in ED Medications - No data to display  ED Course/ Medical Decision Making/ A&P Clinical Course as of 03/22/23 1512  Thu Mar 22, 2023  1459 I spoke with the patient and significant other at the bedside about the labs and imaging findings.  Given that there is questionable evidence of possible early osteomyelitis I will likely do send the patient over to [facility] for an MRI as her machine is down at this time.  Patient is in agreement and will likely travel via private vehicle. [CF]  1500 CBC with Differential Normal.  [CF]  1500 Basic metabolic panel(!) Hyponatremia. Mild Hypochloremia. Significantly elevated glucose.  [CF]  1500 DG Tibia/Fibula Left I personally ordered interpreted the study and there is evidence of possible early infection representing osteomyelitis in the medial compartment. [CF]  1501 I spoke with Dr. Lockie Mola over at [facility] who accepts the transfer. [CF]    Clinical Course User Index [CF] Teressa Lower, PA-C   {   Click here for ABCD2, HEART and other calculators  Medical Decision Making VIRENDER GRIERSON is a 57 y.o. male who presents to the emergency department today for further evaluation of a left leg wound.   Patient is high risk for infection given his medical history and BKA.  Wound overall does not appear to be overtly infected.  There is some redness and irritation but the area is not warm and not actively draining any purulent material.  Area is not tender to palpation.  Will plan to get some basic labs and an x-ray to further assess.  Seen the findings on the imaging patient likely needs an MRI to fully rule out whether or not this is osteomyelitis.  Patient does not have any evidence of leukocytosis which are points against this.  Vital signs are normal.  His wife is with him at the bedside and I do feel that the patient is appropriate to go to [facility] for MRI via private vehicle.  I spoke with Dr. Lockie Mola at [facility] who agrees and accepts transfer.  Patient fully understands and accepts the risks of not getting the MRI in the event that he does not show up at [facility].  I have put in the order for the MRI of the left tibia and fibula.  He is safe for discharge to over to [facility] at this time. All questions and concerns addressed.    Amount and/or Complexity of Data Reviewed Labs: ordered. Decision-making details documented in ED Course. Radiology: ordered. Decision-making details documented in ED Course.    Final Clinical Impression(s) / ED Diagnoses Final diagnoses:  Wound of left lower extremity, initial encounter    Rx / DC Orders ED Discharge Orders     None         Jolyn Lent 03/22/23 1512    Bethann Berkshire, MD 03/24/23 1910

## 2023-03-22 NOTE — ED Triage Notes (Signed)
Pt with drainage to left stump.  Causing pain with ambulation.

## 2023-03-23 DIAGNOSIS — M869 Osteomyelitis, unspecified: Secondary | ICD-10-CM | POA: Diagnosis present

## 2023-03-23 DIAGNOSIS — Z794 Long term (current) use of insulin: Secondary | ICD-10-CM | POA: Diagnosis not present

## 2023-03-23 DIAGNOSIS — L02818 Cutaneous abscess of other sites: Secondary | ICD-10-CM | POA: Diagnosis present

## 2023-03-23 DIAGNOSIS — T8744 Infection of amputation stump, left lower extremity: Secondary | ICD-10-CM | POA: Diagnosis present

## 2023-03-23 DIAGNOSIS — E1165 Type 2 diabetes mellitus with hyperglycemia: Secondary | ICD-10-CM | POA: Diagnosis present

## 2023-03-23 DIAGNOSIS — E11610 Type 2 diabetes mellitus with diabetic neuropathic arthropathy: Secondary | ICD-10-CM | POA: Diagnosis present

## 2023-03-23 DIAGNOSIS — Z89511 Acquired absence of right leg below knee: Secondary | ICD-10-CM | POA: Diagnosis not present

## 2023-03-23 DIAGNOSIS — Y835 Amputation of limb(s) as the cause of abnormal reaction of the patient, or of later complication, without mention of misadventure at the time of the procedure: Secondary | ICD-10-CM | POA: Diagnosis present

## 2023-03-23 DIAGNOSIS — Z7901 Long term (current) use of anticoagulants: Secondary | ICD-10-CM | POA: Diagnosis not present

## 2023-03-23 DIAGNOSIS — Z825 Family history of asthma and other chronic lower respiratory diseases: Secondary | ICD-10-CM | POA: Diagnosis not present

## 2023-03-23 DIAGNOSIS — I10 Essential (primary) hypertension: Secondary | ICD-10-CM | POA: Diagnosis present

## 2023-03-23 DIAGNOSIS — M86062 Acute hematogenous osteomyelitis, left tibia and fibula: Secondary | ICD-10-CM

## 2023-03-23 DIAGNOSIS — I480 Paroxysmal atrial fibrillation: Secondary | ICD-10-CM | POA: Diagnosis present

## 2023-03-23 DIAGNOSIS — Z823 Family history of stroke: Secondary | ICD-10-CM | POA: Diagnosis not present

## 2023-03-23 DIAGNOSIS — Z833 Family history of diabetes mellitus: Secondary | ICD-10-CM | POA: Diagnosis not present

## 2023-03-23 DIAGNOSIS — M8618 Other acute osteomyelitis, other site: Secondary | ICD-10-CM | POA: Diagnosis present

## 2023-03-23 DIAGNOSIS — E871 Hypo-osmolality and hyponatremia: Secondary | ICD-10-CM | POA: Diagnosis present

## 2023-03-23 DIAGNOSIS — E1169 Type 2 diabetes mellitus with other specified complication: Secondary | ICD-10-CM | POA: Diagnosis present

## 2023-03-23 DIAGNOSIS — S81802A Unspecified open wound, left lower leg, initial encounter: Secondary | ICD-10-CM | POA: Diagnosis not present

## 2023-03-23 DIAGNOSIS — Z79899 Other long term (current) drug therapy: Secondary | ICD-10-CM | POA: Diagnosis not present

## 2023-03-23 DIAGNOSIS — E785 Hyperlipidemia, unspecified: Secondary | ICD-10-CM | POA: Diagnosis present

## 2023-03-23 DIAGNOSIS — E878 Other disorders of electrolyte and fluid balance, not elsewhere classified: Secondary | ICD-10-CM | POA: Diagnosis present

## 2023-03-23 DIAGNOSIS — Z7984 Long term (current) use of oral hypoglycemic drugs: Secondary | ICD-10-CM | POA: Diagnosis not present

## 2023-03-23 DIAGNOSIS — E1142 Type 2 diabetes mellitus with diabetic polyneuropathy: Secondary | ICD-10-CM | POA: Diagnosis present

## 2023-03-23 DIAGNOSIS — Z7985 Long-term (current) use of injectable non-insulin antidiabetic drugs: Secondary | ICD-10-CM | POA: Diagnosis not present

## 2023-03-23 LAB — GLUCOSE, CAPILLARY
Glucose-Capillary: 113 mg/dL — ABNORMAL HIGH (ref 70–99)
Glucose-Capillary: 106 mg/dL — ABNORMAL HIGH (ref 70–99)
Glucose-Capillary: 91 mg/dL (ref 70–99)
Glucose-Capillary: 201 mg/dL — ABNORMAL HIGH (ref 70–99)

## 2023-03-23 LAB — PHOSPHORUS: Phosphorus: 3.2 mg/dL (ref 2.5–4.6)

## 2023-03-23 LAB — BASIC METABOLIC PANEL
Anion gap: 9 (ref 5–15)
BUN: 16 mg/dL (ref 6–20)
CO2: 26 mmol/L (ref 22–32)
Calcium: 8.6 mg/dL — ABNORMAL LOW (ref 8.9–10.3)
Chloride: 98 mmol/L (ref 98–111)
Creatinine, Ser: 1.07 mg/dL (ref 0.61–1.24)
GFR, Estimated: >60 mL/min (ref >60)
Glucose, Bld: 118 mg/dL — ABNORMAL HIGH (ref 70–99)
Potassium: 4.2 mmol/L (ref 3.5–5.1)
Sodium: 133 mmol/L — ABNORMAL LOW (ref 135–145)

## 2023-03-23 LAB — CBC
HCT: 41.6 % (ref 39.0–52.0)
Hemoglobin: 14.4 g/dL (ref 13.0–17.0)
MCH: 31 pg (ref 26.0–34.0)
MCHC: 34.6 g/dL (ref 30.0–36.0)
MCV: 89.5 fL (ref 80.0–100.0)
Platelets: 190 10*3/uL (ref 150–400)
RBC: 4.65 MIL/uL (ref 4.22–5.81)
RDW: 12.5 % (ref 11.5–15.5)
WBC: 7.5 10*3/uL (ref 4.0–10.5)
nRBC: 0 % (ref 0.0–0.2)

## 2023-03-23 LAB — HIV ANTIBODY (ROUTINE TESTING W REFLEX): HIV Screen 4th Generation wRfx: NONREACTIVE

## 2023-03-23 LAB — MRSA NEXT GEN BY PCR, NASAL: MRSA by PCR Next Gen: NOT DETECTED

## 2023-03-23 LAB — HEMOGLOBIN A1C
Hgb A1c MFr Bld: 7.9 % — ABNORMAL HIGH (ref 4.8–5.6)
Mean Plasma Glucose: 180.03 mg/dL

## 2023-03-23 LAB — MAGNESIUM: Magnesium: 1.5 mg/dL — ABNORMAL LOW (ref 1.7–2.4)

## 2023-03-23 LAB — CBG MONITORING, ED: Glucose-Capillary: 146 mg/dL — ABNORMAL HIGH (ref 70–99)

## 2023-03-23 MED ORDER — METOPROLOL SUCCINATE ER 25 MG PO TB24
25.0000 mg | ORAL_TABLET | Freq: Every day | ORAL | Status: DC
  Administered 2023-03-23 – 2023-03-24 (×2): 25 mg via ORAL
  Filled 2023-03-23 (×2): qty 1

## 2023-03-23 MED ORDER — ATORVASTATIN CALCIUM 10 MG PO TABS
20.0000 mg | ORAL_TABLET | Freq: Every day | ORAL | Status: DC
  Administered 2023-03-23 – 2023-03-24 (×2): 20 mg via ORAL
  Filled 2023-03-23 (×2): qty 2

## 2023-03-23 MED ORDER — INSULIN ASPART 100 UNIT/ML IJ SOLN
0.0000 [IU] | Freq: Three times a day (TID) | INTRAMUSCULAR | Status: DC
  Administered 2023-03-24: 2 [IU] via SUBCUTANEOUS

## 2023-03-23 MED ORDER — HYDROMORPHONE HCL 1 MG/ML IJ SOLN: 0.5000 mg | INTRAMUSCULAR | Status: DC | PRN

## 2023-03-23 MED ORDER — DOXYCYCLINE HYCLATE 100 MG PO CAPS: 100.0000 mg | ORAL_CAPSULE | Freq: Two times a day (BID) | ORAL | 0 refills | Status: AC

## 2023-03-23 MED ORDER — ACETAMINOPHEN 325 MG PO TABS: 650.0000 mg | ORAL_TABLET | Freq: Four times a day (QID) | ORAL | Status: DC | PRN

## 2023-03-23 MED ORDER — OXYCODONE HCL 5 MG PO TABS
5.0000 mg | ORAL_TABLET | Freq: Four times a day (QID) | ORAL | Status: DC | PRN
  Administered 2023-03-23: 5 mg via ORAL
  Filled 2023-03-23 (×2): qty 1

## 2023-03-23 MED ORDER — VANCOMYCIN HCL 2000 MG/400ML IV SOLN
2000.0000 mg | Freq: Once | INTRAVENOUS | Status: AC
  Administered 2023-03-23: 2000 mg via INTRAVENOUS
  Filled 2023-03-23: qty 400

## 2023-03-23 MED ORDER — PROCHLORPERAZINE EDISYLATE 10 MG/2ML IJ SOLN: 5.0000 mg | Freq: Four times a day (QID) | INTRAMUSCULAR | Status: DC | PRN

## 2023-03-23 MED ORDER — GABAPENTIN 300 MG PO CAPS
300.0000 mg | ORAL_CAPSULE | Freq: Every day | ORAL | Status: DC
  Administered 2023-03-23: 300 mg via ORAL
  Filled 2023-03-23: qty 1

## 2023-03-23 MED ORDER — SODIUM CHLORIDE 0.9 % IV SOLN
2.0000 g | Freq: Three times a day (TID) | INTRAVENOUS | Status: DC
  Administered 2023-03-23 – 2023-03-24 (×4): 2 g via INTRAVENOUS
  Filled 2023-03-23 (×4): qty 12.5

## 2023-03-23 MED ORDER — INSULIN ASPART 100 UNIT/ML IJ SOLN
0.0000 [IU] | Freq: Every day | INTRAMUSCULAR | Status: DC
  Administered 2023-03-23: 2 [IU] via SUBCUTANEOUS

## 2023-03-23 MED ORDER — POLYETHYLENE GLYCOL 3350 17 G PO PACK: 17.0000 g | PACK | Freq: Every day | ORAL | Status: DC | PRN

## 2023-03-23 MED ORDER — INSULIN GLARGINE-YFGN 100 UNIT/ML ~~LOC~~ SOLN
25.0000 [IU] | Freq: Every day | SUBCUTANEOUS | Status: DC
  Administered 2023-03-23: 25 [IU] via SUBCUTANEOUS
  Filled 2023-03-23 (×3): qty 0.25

## 2023-03-23 MED ORDER — HEPARIN SODIUM (PORCINE) 5000 UNIT/ML IJ SOLN
5000.0000 [IU] | Freq: Three times a day (TID) | INTRAMUSCULAR | Status: DC
  Administered 2023-03-23 – 2023-03-24 (×4): 5000 [IU] via SUBCUTANEOUS
  Filled 2023-03-23 (×4): qty 1

## 2023-03-23 MED ORDER — VANCOMYCIN HCL 1250 MG/250ML IV SOLN
1250.0000 mg | Freq: Two times a day (BID) | INTRAVENOUS | Status: DC
  Administered 2023-03-23 – 2023-03-24 (×2): 1250 mg via INTRAVENOUS
  Filled 2023-03-23 (×2): qty 250

## 2023-03-23 MED ORDER — SODIUM CHLORIDE 0.9 % IV SOLN: INTRAVENOUS | Status: DC

## 2023-03-23 NOTE — Plan of Care (Signed)
  Problem: Fluid Volume: Goal: Ability to maintain a balanced intake and output will improve Outcome: Progressing   Problem: Nutritional: Goal: Maintenance of adequate nutrition will improve Outcome: Progressing   Problem: Skin Integrity: Goal: Risk for impaired skin integrity will decrease Outcome: Not Progressing

## 2023-03-23 NOTE — ED Notes (Signed)
ED TO INPATIENT HANDOFF REPORT  ED Nurse Name and Phone #: Grover Canavan [phone]  S Name/Age/Gender [name] 57 y.o. male Room/Bed: H012C/H012C  Code Status   Code Status: Prior  Home/SNF/Other Home Patient oriented to: self, place, time, and situation Is this baseline? Yes   Triage Complete: Triage complete  Chief Complaint Osteomyelitis [facility]) [M86.9]  Triage Note Pt with drainage to left stump.  Causing pain with ambulation.    Allergies No Known Allergies  Level of Care/Admitting Diagnosis ED Disposition   ED Disposition: Admit Condition: None Comment: Hospital Area: [facility] [[id number]]  Level of Care: Telemetry Surgical [105]  May admit patient to [facility] or [facility] if equivalent level of care is available:: Yes  Covid Evaluation: Asymptomatic - no recent exposure (last 10 days) testing not required  Diagnosis: Osteomyelitis [facility]) [[id number]]  Admitting Physician: Darlin Drop [[id number]]  Attending Physician: Darlin Drop [[id number]]  Certification:: I certify this patient will need inpatient services for at least 2 midnights  Estimated Length of Stay: 2      B Medical/Surgery History Past Medical History:  Diagnosis Date   Charcot ankle    Essential hypertension    Osteomyelitis of toe of right foot (HCC)    Paroxysmal atrial fibrillation (HCC)    Recurrent boils    Type 2 diabetes mellitus with diabetic neuropathy [facility])    Diagnosed age 70   Past Surgical History:  Procedure Laterality Date   AMPUTATION Right 06/17/2014   Procedure: PARTIAL AMPUTATION RIGHT 3RD TOE;  Surgeon: [name], DPM;  Location: [facility] ORS;  Service: Podiatry;  Laterality: Right;   AMPUTATION Right 09/02/2014   Procedure: PARTIAL AMPUTATION 2ND TOE RIGHT FOOT;  Surgeon: [name], DPM;  Location: [facility] ORS;  Service: Podiatry;  Laterality: Right;   AMPUTATION Right 04/01/2015   Procedure: AMPUTATION DIGIT 1ST TOE RIGHT FOOT;  Surgeon:  Laurell Josephs, DPM;  Location: [facility] ORS;  Service: Podiatry;  Laterality: Right;   AMPUTATION Right 06/29/2016   pinkeye    AMPUTATION Right 10/24/2019   Procedure: PARTIAL THIRD RAY AMPUTATION;  Surgeon: Ferman Hamming, DPM;  Location: [facility] ORS;  Service: Podiatry;  Laterality: Right;   AMPUTATION Right 02/19/2020   Procedure: AMPUTATION BELOW KNEE; RIGHT;  Surgeon: [name], MD;  Location: [facility] ORS;  Service: General;  Laterality: Right;   FOOT AMPUTATION Left    FOOT SURGERY Left 03/2017   HERNIA REPAIR Bilateral    and 3rd hernia repair does not remember ehat side   IRRIGATION AND DEBRIDEMENT ABSCESS Right 11/05/2019   Procedure: IRRIGATION AND DRAINAGE ABSCESS RIGHT ANKLE;  Surgeon: Ferman Hamming, DPM;  Location: [facility] ORS;  Service: Podiatry;  Laterality: Right;   TEE WITHOUT CARDIOVERSION N/A 10/29/2019   Procedure: TRANSESOPHAGEAL ECHOCARDIOGRAM (TEE) WITH PROPOFOL;  Surgeon: Pricilla Riffle, MD;  Location: [facility] ENDO SUITE;  Service: Cardiovascular;  Laterality: N/A;     A IV Location/Drains/Wounds Patient Lines/Drains/Airways Status     Active Line/Drains/Airways     Name Placement date Placement time Site Days   Peripheral IV 03/23/23 20 G Anterior;Distal;Left;Upper Arm 03/23/23  0025  Arm  less than 1            Intake/Output Last 24 hours No intake or output data in the 24 hours ending 03/23/23 0054  Labs/Imaging Results for orders placed or performed during the hospital encounter of 03/22/23 (from the past 48 hour(s))  CBC with Differential     Status: None  Collection Time: 03/22/23  2:11 PM  Result Value Ref Range   WBC 8.8 4.0 - 10.5 K/uL   RBC 4.90 4.22 - 5.81 MIL/uL   Hemoglobin 15.2 13.0 - 17.0 g/dL   HCT 16.1 09.6 - 04.5 %   MCV 90.4 80.0 - 100.0 fL   MCH 31.0 26.0 - 34.0 pg   MCHC 34.3 30.0 - 36.0 g/dL   RDW 40.9 81.1 - 91.4 %   Platelets 214 150 - 400 K/uL   nRBC 0.0 0.0 - 0.2 %   Neutrophils Relative % 78 %   Neutro Abs 7.0 1.7 - 7.7 K/uL    Lymphocytes Relative 12 %   Lymphs Abs 1.1 0.7 - 4.0 K/uL   Monocytes Relative 8 %   Monocytes Absolute 0.7 0.1 - 1.0 K/uL   Eosinophils Relative 0 %   Eosinophils Absolute 0.0 0.0 - 0.5 K/uL   Basophils Relative 1 %   Basophils Absolute 0.0 0.0 - 0.1 K/uL   Immature Granulocytes 1 %   Abs Immature Granulocytes 0.04 0.00 - 0.07 K/uL    Comment: Performed at [facility], [address] [place], [place] [address]  Basic metabolic panel     Status: Abnormal   Collection Time: 03/22/23  2:11 PM  Result Value Ref Range   Sodium 131 (L) 135 - 145 mmol/L   Potassium 4.6 3.5 - 5.1 mmol/L   Chloride 94 (L) 98 - 111 mmol/L   CO2 27 22 - 32 mmol/L   Glucose, Bld 366 (H) 70 - 99 mg/dL    Comment: Glucose reference range applies only to samples taken after fasting for at least 8 hours.   BUN 18 6 - 20 mg/dL   Creatinine, Ser 6.21 0.61 - 1.24 mg/dL   Calcium 8.7 (L) 8.9 - 10.3 mg/dL   GFR, Estimated >30 >86 mL/min    Comment: (NOTE) Calculated using the CKD-EPI Creatinine Equation (2021)    Anion gap 10 5 - 15    Comment: Performed at [facility], [address] [place], [place] [address]   MR TIBIA FIBULA LEFT W WO CONTRAST  Result Date: 03/22/2023 CLINICAL DATA:  Osteomyelitis suspected. Below-knee amputation wound. EXAM: MRI OF LOWER LEFT EXTREMITY WITHOUT AND WITH CONTRAST TECHNIQUE: Multiplanar, multisequence MR imaging of the left was performed both before and after administration of intravenous contrast. CONTRAST:  10mL GADAVIST GADOBUTROL 1 MMOL/ML IV SOLN COMPARISON:  Radiographs dated Mar 22, 2019 FINDINGS: Bones/Joint/Cartilage Status post left below-knee amputation. There is bone marrow edema of the tibia which extends from the amputation site proximally extending up to 10 cm. Residual fibula is normal in signal. Ligaments Knee ligaments appear intact. Muscles and Tendons There is marked edema of the flexor and extensor muscles suggesting myopathy/myositis. Soft tissues There is a  skin wound and small abscess about the amputation site with peripherally enhancing borders measuring a proximally 1.4 x 2.5 x 1.5 cm. Marked subcutaneous soft tissue edema about the stump. IMPRESSION: 1. Status post left below-knee amputation with bone marrow edema of the tibia extending proximally up to 10 cm concerning for osteomyelitis. 2. Skin wound and small abscess about the amputation site measuring 1.4 x 2.5 x 1.5 cm. 3. Marked edema of the flexor and extensor muscles suggesting myopathy/myositis. Electronically Signed   By: Larose Hires D.O.   On: 03/22/2023 23:48   DG Tibia/Fibula Left  Result Date: 03/22/2023 CLINICAL DATA:  BKA wound EXAM: LEFT TIBIA AND FIBULA - 2 VIEW COMPARISON:  February 05, 2023  FINDINGS: Osteopenia. Status post below the knee amputation. There is slightly increased lucency and irregularity along the medial distal tibial surgical margin cortex. Smooth surgical margins of the distal fibula. No acute fracture or dislocation. No soft tissue air. IMPRESSION: Slightly increased lucency and irregularity along the medial distal tibial cortex at the surgical margin. This is nonspecific but could reflect early osteomyelitis. Electronically Signed   By: Meda Klinefelter M.D.   On: 03/22/2023 14:23    Pending Labs Unresulted Labs (From admission, onward)     Start     Ordered   03/23/23 0500  CBC  Tomorrow morning,   R        03/23/23 0035   03/23/23 0500  Basic metabolic panel  Tomorrow morning,   R        03/23/23 0035   03/23/23 0500  Magnesium  Tomorrow morning,   R        03/23/23 0035   03/23/23 0500  Phosphorus  Tomorrow morning,   R        03/23/23 0035   03/23/23 0035  MRSA Next Gen by PCR, Nasal  Once,   R       Question:  Patient immune status  Answer:  Normal   03/23/23 0034   03/23/23 0032  Hemoglobin A1c  Once,   R       Comments: To assess prior glycemic control    03/23/23 0031   03/22/23 2354  Blood culture (routine x 2)  BLOOD CULTURE X 2,   R (with  STAT occurrences)      03/22/23 2353            Vitals/Pain Today's Vitals   03/22/23 1303 03/22/23 1551 03/23/23 0043 03/23/23 0047  BP: 131/81 106/72 118/79   Pulse: 97 92 (Abnormal) 103   Resp: 16 16 18    Temp: 98.4 F (36.9 C) 98.2 F (36.8 C) 98.8 F (37.1 C)   TempSrc: Oral Oral Oral   SpO2: 94% 98% 94%   Weight:      PainSc:  3   8     Isolation Precautions No active isolations  Medications Medications  ceFEPIme (MAXIPIME) 2 g in sodium chloride 0.9 % 100 mL IVPB (2 g Intravenous New Bag/Given 03/23/23 0046)  0.9 %  sodium chloride infusion (has no administration in time range)  insulin aspart (novoLOG) injection 0-9 Units (has no administration in time range)  insulin aspart (novoLOG) injection 0-5 Units (has no administration in time range)  atorvastatin (LIPITOR) tablet 20 mg (has no administration in time range)  gabapentin (NEURONTIN) capsule 300 mg (has no administration in time range)  acetaminophen (TYLENOL) tablet 650 mg (has no administration in time range)  prochlorperazine (COMPAZINE) injection 5 mg (has no administration in time range)  oxyCODONE (Oxy IR/ROXICODONE) immediate release tablet 5 mg (has no administration in time range)  HYDROmorphone (DILAUDID) injection 0.5 mg (has no administration in time range)  polyethylene glycol (MIRALAX / GLYCOLAX) packet 17 g (has no administration in time range)  vancomycin (VANCOREADY) IVPB 2000 mg/400 mL (has no administration in time range)  ceFEPIme (MAXIPIME) 2 g in sodium chloride 0.9 % 100 mL IVPB (has no administration in time range)  vancomycin (VANCOREADY) IVPB 1250 mg/250 mL (has no administration in time range)  oxyCODONE (Oxy IR/ROXICODONE) immediate release tablet 5 mg (5 mg Oral Given 03/22/23 2113)  gadobutrol (GADAVIST) 1 MMOL/ML injection 10 mL (10 mLs Intravenous Contrast Given 03/22/23 2309)    Mobility walks  with device     Focused Assessments Musculoskeletal    R Recommendations:  See Admitting Provider Note  Report given to:   Additional Notes:  2 prosthetic legs

## 2023-03-23 NOTE — Progress Notes (Signed)
Pharmacy Antibiotic Note  [name] is a 57 y.o. male admitted on 03/22/2023 with  L BKA site infection (s/p BKA for osteomyelitis) .  Pharmacy has been consulted for Vancomycin and Cefepime dosing.  Plan: Cefepime 2gm IV q8h Vancomycin 2000 mg IV now then 1250 mg IV Q 12 hrs. Goal AUC 400-550. Expected AUC: 446 SCr used: 1.03 Will f/u renal function, micro data, and pt's clinical condition Vanc levels prn   Weight: 97.5 kg (215 lb)  Temp (24hrs), Avg:98.3 F (36.8 C), Min:98.2 F (36.8 C), Max:98.4 F (36.9 C)  Recent Labs  Lab 03/22/23 1411  WBC 8.8  CREATININE 1.03    Estimated Creatinine Clearance: 90.9 mL/min (by C-G formula based on SCr of 1.03 mg/dL).    No Known Allergies  Antimicrobials this admission: 5/17 Cefepime >>  5/17 Vanc >>   Microbiology results:  BCx:   MRSA PCR:   Thank you for allowing pharmacy to be a part of this patient's care.  Christoper Fabian, PharmD, BCPS Please see amion for complete clinical pharmacist phone list 03/23/2023 12:35 AM

## 2023-03-23 NOTE — Plan of Care (Signed)
  Problem: Metabolic: Goal: Ability to maintain appropriate glucose levels will improve Outcome: Progressing   Problem: Skin Integrity: Goal: Risk for impaired skin integrity will decrease Outcome: Progressing   Problem: Education: Goal: Knowledge of General Education information will improve Description: Including pain rating scale, medication(s)/side effects and non-pharmacologic comfort measures Outcome: Progressing   Problem: Activity: Goal: Risk for activity intolerance will decrease Outcome: Progressing   Problem: Pain Managment: Goal: General experience of comfort will improve Outcome: Progressing   Problem: Skin Integrity: Goal: Risk for impaired skin integrity will decrease Outcome: Progressing

## 2023-03-23 NOTE — Progress Notes (Signed)
  PROGRESS NOTE  Patient admitted earlier this morning. See H&P.   [name] is a 57 y.o. male with medical history significant for osteomyelitis status post bilateral BKA, paroxysmal A-fib on Eliquis, type 2 diabetes, who initially presented to [facility] ED due to wound, erythema, pain, warmth on his left BKA stump for the past 2 days.  The wound has been draining serous fluid and pus, and has become more painful.  Treated with antibiotics cefadroxil x 1 week at the beginning of April.  Initially symptoms resolved and for the last few days they recurred.  Sent from [facility] ED to [facility] ED for left lower extremity MRI to evaluate osteomyelitis.  MRI of left lower extremity did reveal bone marrow edema of the tibia extending proximally, concerning for osteomyelitis, skin wound and small abscess at the amputation site, marked edema of flexor and extensor muscles suggesting myopathy/myositis.  Patient was started on broad-spectrum antibiotics vancomycin, cefepime and admitted to the hospital.  Orthopedic surgery consulted.    A/P:   Continue vancomycin, cefepime Hold Eliquis Await orthopedic surgery recommendations    Status is: Inpatient Remains inpatient appropriate because: IV antibiotics, surgical recommendations pending    Noralee Stain, DO [facility] 03/23/2023, 10:35 AM  Available via Epic secure chat 7am-7pm After these hours, please refer to coverage provider listed on [email or web address]

## 2023-03-23 NOTE — H&P (Signed)
History and Physical  [name] WUJ:[id number] DOB: [birth year] DOA: 03/22/2023  Referring physician: Dr. Earlene Plater, Resident-EDP  PCP: Dettinger, Elige Radon, MD  Outpatient Specialists: None Patient coming from: Home to [facility], ED  Chief Complaint: Wound on left lower extremity with concern for osteomyelitis  HPI: [name] is a 57 y.o. male with medical history significant for osteomyelitis status post bilateral BKA, paroxysmal A-fib on Eliquis, type 2 diabetes, who initially presented to [facility] ED due to wound, erythema, pain, warmth on his left BKA stump for the past 2 days.  The wound has been draining serous fluid and pus, and has become more painful.  Treated with antibiotics cefadroxil x 1 week at the beginning of April.  Initially symptoms resolved and for the last few days they recurred.  Sent from [facility] ED to [facility] ED for left lower extremity MRI to evaluate osteomyelitis.  In the ED, left lower extremity MRI revealed the following findings: 1. Status post left below-knee amputation with bone marrow edema of the tibia extending proximally up to 10 cm concerning for osteomyelitis. 2. Skin wound and small abscess about the amputation site measuring 1.4 x 2.5 x 1.5 cm. 3. Marked edema of the flexor and extensor muscles suggesting myopathy/myositis.   The patient was started on broad-spectrum IV antibiotics cefepime and IV vancomycin.  Also received opiate-based analgesics.  TR[facility], hospitalist service, was asked to admit.  ED Course: Temperature 98.2.  BP 106/72, pulse 92, respiratory 16, saturation 98% on room air.  Lab studies remarkable for serum sodium 131, serum glucose 366, CBC essentially unremarkable.  Review of Systems: Review of systems as noted in the HPI. All other systems reviewed and are negative.   Past Medical History:  Diagnosis Date   Charcot ankle    Essential hypertension    Osteomyelitis of toe of right foot (HCC)    Paroxysmal  atrial fibrillation (HCC)    Recurrent boils    Type 2 diabetes mellitus with diabetic neuropathy [facility])    Diagnosed age 58   Past Surgical History:  Procedure Laterality Date   AMPUTATION Right 06/17/2014   Procedure: PARTIAL AMPUTATION RIGHT 3RD TOE;  Surgeon: [name], DPM;  Location: [facility];  Service: Podiatry;  Laterality: Right;   AMPUTATION Right 09/02/2014   Procedure: PARTIAL AMPUTATION 2ND TOE RIGHT FOOT;  Surgeon: [name], DPM;  Location: [facility];  Service: Podiatry;  Laterality: Right;   AMPUTATION Right 04/01/2015   Procedure: AMPUTATION DIGIT 1ST TOE RIGHT FOOT;  Surgeon: Laurell Josephs, DPM;  Location: [facility];  Service: Podiatry;  Laterality: Right;   AMPUTATION Right 06/29/2016   pinkeye    AMPUTATION Right 10/24/2019   Procedure: PARTIAL THIRD RAY AMPUTATION;  Surgeon: Ferman Hamming, DPM;  Location: [facility];  Service: Podiatry;  Laterality: Right;   AMPUTATION Right 02/19/2020   Procedure: AMPUTATION BELOW KNEE; RIGHT;  Surgeon: [name], MD;  Location: [facility];  Service: General;  Laterality: Right;   FOOT AMPUTATION Left    FOOT SURGERY Left 03/2017   HERNIA REPAIR Bilateral    and 3rd hernia repair does not remember ehat side   IRRIGATION AND DEBRIDEMENT ABSCESS Right 11/05/2019   Procedure: IRRIGATION AND DRAINAGE ABSCESS RIGHT ANKLE;  Surgeon: Ferman Hamming, DPM;  Location: [facility];  Service: Podiatry;  Laterality: Right;   TEE WITHOUT CARDIOVERSION N/A 10/29/2019   Procedure: TRANSESOPHAGEAL ECHOCARDIOGRAM (TEE) WITH PROPOFOL;  Surgeon: Pricilla Riffle, MD;  Location: [facility];  Service: Cardiovascular;  Laterality: N/A;    Social History:  reports that he has never smoked. He has never used smokeless tobacco. He reports that he does not drink alcohol and does not use drugs.   No Known Allergies  Family History  Problem Relation Age of Onset   Diabetes Mother    Stroke Father    Asthma Father    COPD Father    Aneurysm  Father        AAA   Diabetes Brother    Stroke Brother        2008   Colon cancer Neg Hx    Heart disease Neg Hx    Rectal cancer Neg Hx    Stomach cancer Neg Hx       Prior to Admission medications   Medication Sig Start Date End Date Taking? Authorizing Provider  apixaban (ELIQUIS) 5 MG TABS tablet Take 1 tablet (5 mg total) by mouth 2 (two) times daily. 11/16/22  Yes Jonelle Sidle, MD  atorvastatin (LIPITOR) 20 MG tablet TAKE 1 TABLET BY MOUTH EVERY DAY 12/11/22  Yes Dettinger, Elige Radon, MD  fluticasone (FLONASE) 50 MCG/ACT nasal spray Place 1 spray into both nostrils 2 (two) times daily as needed for allergies or rhinitis. 01/08/19  Yes Dettinger, Elige Radon, MD  gabapentin (NEURONTIN) 600 MG tablet Take 1 tablet (600 mg total) by mouth 3 (three) times daily. Patient taking differently: Take 600 mg by mouth at bedtime. 04/24/22  Yes Dettinger, Elige Radon, MD  HYDROcodone-acetaminophen (NORCO) 10-325 MG tablet Take 1 tablet by mouth 2 (two) times daily as needed. Patient taking differently: Take 1 tablet by mouth 2 (two) times daily as needed for moderate pain. Take 1/2 to 1 tablet daily as needed 02/24/23  Yes Dettinger, Elige Radon, MD  insulin aspart (NOVOLOG FLEXPEN) 100 UNIT/ML FlexPen Inject 5-10 Units into the skin 3 (three) times daily with meals. Patient taking differently: Inject 15-20 Units into the skin 3 (three) times daily with meals. 11/21/22  Yes Dettinger, Elige Radon, MD  insulin degludec (TRESIBA FLEXTOUCH) 100 UNIT/ML FlexTouch Pen Inject 72-76 Units into the skin daily.   Yes [provider]  metFORMIN (GLUCOPHAGE) 1000 MG tablet Take 1 tablet (1,000 mg total) by mouth 2 (two) times daily. 04/24/22  Yes Dettinger, Elige Radon, MD  metoprolol succinate (TOPROL-XL) 25 MG 24 hr tablet TAKE 1 TABLET BY MOUTH EVERY DAY 03/17/22  Yes Jonelle Sidle, MD  Semaglutide, 2 MG/DOSE, 8 MG/3ML SOPN Inject 2 mg as directed once a week. 11/21/22  Yes Dettinger, Elige Radon, MD  benzonatate  (TESSALON PERLES) 100 MG capsule Take 1 capsule (100 mg total) by mouth 3 (three) times daily as needed for cough. Patient not taking: Reported on 01/30/2023 12/12/22   Sonny Masters, FNP  Continuous Blood Gluc Receiver (FREESTYLE LIBRE 2 READER) DEVI Use as directed to test blood sugar up to 6 times daily as directed. DX: E 11.9 09/22/21   Dettinger, Elige Radon, MD  Continuous Blood Gluc Sensor (FREESTYLE LIBRE 2 SENSOR) MISC Use as directed to test blood sugar up to 6 times daily as directed. DX: E 11.9 09/22/21   Dettinger, Elige Radon, MD  DULoxetine (CYMBALTA) 60 MG capsule Take 1 capsule (60 mg total) by mouth daily. Patient not taking: Reported on 03/22/2023 04/24/22   Dettinger, Elige Radon, MD  HYDROcodone-acetaminophen [facility]) 10-325 MG tablet Take 1 tablet by mouth 2 (two) times daily as needed. Patient not taking: Reported on 03/22/2023 01/25/23  Dettinger, Elige Radon, MD  HYDROcodone-acetaminophen (NORCO) 10-325 MG tablet Take 1 tablet by mouth 2 (two) times daily as needed. Patient not taking: Reported on 03/22/2023 02/24/23   Dettinger, Elige Radon, MD    Physical Exam: BP 106/72 (BP Location: Left Arm)   Pulse 92   Temp 98.2 F (36.8 C) (Oral)   Resp 16   Wt 97.5 kg   SpO2 98%   BMI 27.60 kg/m   General: 57 y.o. year-old male well developed well nourished in no acute distress.  Alert and oriented x3. Cardiovascular: Regular rate and rhythm with no rubs or gallops.  No thyromegaly or JVD noted.  No lower extremity edema. 2/4 pulses in all 4 extremities. Respiratory: Clear to auscultation with no wheezes or rales. Good inspiratory effort. Abdomen: Soft nontender nondistended with normal bowel sounds x4 quadrants. Muskuloskeletal: No cyanosis, clubbing or edema noted bilaterally Neuro: CN II-XII intact, strength, sensation, reflexes Skin: Erythema and warmth involving the left lower extremity. Psychiatry: Judgement and insight appear normal. Mood is appropriate for condition and setting           Labs on Admission:  Basic Metabolic Panel: Recent Labs  Lab 03/22/23 1411  NA 131*  K 4.6  CL 94*  CO2 27  GLUCOSE 366*  BUN 18  CREATININE 1.03  CALCIUM 8.7*   Liver Function Tests: No results for input(s): "AST", "ALT", "ALKPHOS", "BILITOT", "PROT", "ALBUMIN" in the last 168 hours. No results for input(s): "LIPASE", "AMYLASE" in the last 168 hours. No results for input(s): "AMMONIA" in the last 168 hours. CBC: Recent Labs  Lab 03/22/23 1411  WBC 8.8  NEUTROABS 7.0  HGB 15.2  HCT 44.3  MCV 90.4  PLT 214   Cardiac Enzymes: No results for input(s): "CKTOTAL", "CKMB", "CKMBINDEX", "TROPONINI" in the last 168 hours.  BNP (last 3 results) No results for input(s): "BNP" in the last 8760 hours.  ProBNP (last 3 results) No results for input(s): "PROBNP" in the last 8760 hours.  CBG: No results for input(s): "GLUCAP" in the last 168 hours.  Radiological Exams on Admission: MR TIBIA FIBULA LEFT W WO CONTRAST  Result Date: 03/22/2023 CLINICAL DATA:  Osteomyelitis suspected. Below-knee amputation wound. EXAM: MRI OF LOWER LEFT EXTREMITY WITHOUT AND WITH CONTRAST TECHNIQUE: Multiplanar, multisequence MR imaging of the left was performed both before and after administration of intravenous contrast. CONTRAST:  10mL GADAVIST GADOBUTROL 1 MMOL/ML IV SOLN COMPARISON:  Radiographs dated Mar 22, 2019 FINDINGS: Bones/Joint/Cartilage Status post left below-knee amputation. There is bone marrow edema of the tibia which extends from the amputation site proximally extending up to 10 cm. Residual fibula is normal in signal. Ligaments Knee ligaments appear intact. Muscles and Tendons There is marked edema of the flexor and extensor muscles suggesting myopathy/myositis. Soft tissues There is a skin wound and small abscess about the amputation site with peripherally enhancing borders measuring a proximally 1.4 x 2.5 x 1.5 cm. Marked subcutaneous soft tissue edema about the stump. IMPRESSION: 1.  Status post left below-knee amputation with bone marrow edema of the tibia extending proximally up to 10 cm concerning for osteomyelitis. 2. Skin wound and small abscess about the amputation site measuring 1.4 x 2.5 x 1.5 cm. 3. Marked edema of the flexor and extensor muscles suggesting myopathy/myositis. Electronically Signed   By: Larose Hires D.O.   On: 03/22/2023 23:48   DG Tibia/Fibula Left  Result Date: 03/22/2023 CLINICAL DATA:  BKA wound EXAM: LEFT TIBIA AND FIBULA - 2 VIEW COMPARISON:  February 05, 2023 FINDINGS: Osteopenia. Status post below the knee amputation. There is slightly increased lucency and irregularity along the medial distal tibial surgical margin cortex. Smooth surgical margins of the distal fibula. No acute fracture or dislocation. No soft tissue air. IMPRESSION: Slightly increased lucency and irregularity along the medial distal tibial cortex at the surgical margin. This is nonspecific but could reflect early osteomyelitis. Electronically Signed   By: Meda Klinefelter M.D.   On: 03/22/2023 14:23    EKG: I independently viewed the EKG done and my findings are as followed: None available at the time of this visit.  Assessment/Plan Present on Admission:  Osteomyelitis (HCC)  Active Problems:   Osteomyelitis (HCC)  Left BKA stump osteomyelitis, POA Seen on MRI Follow blood cultures x 2 peripherally Continue empiric IV antibiotics IV vancomycin, cefepime Monitor fever curve and WBC Follow MRSA screening test Maintain MAP greater than 65 Gentle IV fluid hydration NS at 50 cc/h x 2 days.  Type 2 diabetes with hyperglycemia Presented with serum glucose of 366 Heart healthy carb modified diet. Obtain hemoglobin A1c Hold off home hypoglycemics Start insulin sliding scale.  Diabetic polyneuropathy Resume home gabapentin  Hyperlipidemia Resume home Lipitor.     DVT prophylaxis: Subcu heparin 3 times daily  Code Status: Full code  Family Communication: None at  bedside  Disposition Plan: Admitted to telemetry surgical unit.  Consults called: Orthopedic surgery was consulted by EDP.  Admission status: Inpatient status.   Status is: Inpatient The patient requires at least 2 midnights for further evaluation and treatment of present condition.   Darlin Drop MD [facility] Pager [phone]  If 7PM-7AM, please contact night-coverage [email or web address] Password [facility]  03/23/2023, 12:19 AM

## 2023-03-23 NOTE — Consult Note (Signed)
ORTHOPAEDIC CONSULTATION  REQUESTING PHYSICIAN: Noralee Stain, DO  Chief Complaint: Acute pain and swelling left below-knee amputation.  HPI: Anthony SCIUTO is a 57 y.o. male who presents with bilateral transtibial amputations.  Patient states that the right leg is more recent and has no problems.  Patient states the left leg amputation is 59 years old his prosthesis is 59 years old.  Patient has been and bearing in the socket causing increased pain over the residual limb.  Patient states he had an acute fall recently and struck the end of the residual limb on the left.  Patient states the pain was acute after the traumatic event.  Past Medical History:  Diagnosis Date   Charcot ankle    Essential hypertension    Osteomyelitis of toe of right foot (HCC)    Paroxysmal atrial fibrillation (HCC)    Recurrent boils    Type 2 diabetes mellitus with diabetic neuropathy [facility])    Diagnosed age 26   Past Surgical History:  Procedure Laterality Date   AMPUTATION Right 06/17/2014   Procedure: PARTIAL AMPUTATION RIGHT 3RD TOE;  Surgeon: [name], DPM;  Location: [facility];  Service: Podiatry;  Laterality: Right;   AMPUTATION Right 09/02/2014   Procedure: PARTIAL AMPUTATION 2ND TOE RIGHT FOOT;  Surgeon: [name], DPM;  Location: [facility];  Service: Podiatry;  Laterality: Right;   AMPUTATION Right 04/01/2015   Procedure: AMPUTATION DIGIT 1ST TOE RIGHT FOOT;  Surgeon: Laurell Josephs, DPM;  Location: [facility];  Service: Podiatry;  Laterality: Right;   AMPUTATION Right 06/29/2016   pinkeye    AMPUTATION Right 10/24/2019   Procedure: PARTIAL THIRD RAY AMPUTATION;  Surgeon: Ferman Hamming, DPM;  Location: [facility];  Service: Podiatry;  Laterality: Right;   AMPUTATION Right 02/19/2020   Procedure: AMPUTATION BELOW KNEE; RIGHT;  Surgeon: [name], MD;  Location: [facility];  Service: General;  Laterality: Right;   FOOT AMPUTATION Left    FOOT SURGERY Left 03/2017   HERNIA REPAIR  Bilateral    and 3rd hernia repair does not remember ehat side   IRRIGATION AND DEBRIDEMENT ABSCESS Right 11/05/2019   Procedure: IRRIGATION AND DRAINAGE ABSCESS RIGHT ANKLE;  Surgeon: Ferman Hamming, DPM;  Location: [facility];  Service: Podiatry;  Laterality: Right;   TEE WITHOUT CARDIOVERSION N/A 10/29/2019   Procedure: TRANSESOPHAGEAL ECHOCARDIOGRAM (TEE) WITH PROPOFOL;  Surgeon: Pricilla Riffle, MD;  Location: [facility];  Service: Cardiovascular;  Laterality: N/A;   Social History   Socioeconomic History   Marital status: Significant Other    Spouse name: Not on file   Number of children: 1   Years of education: Not on file   Highest education level: Some college, no degree  Occupational History   Occupation: Receiving  Tobacco Use   Smoking status: Never   Smokeless tobacco: Never  Vaping Use   Vaping Use: Never used  Substance and Sexual Activity   Alcohol use: No   Drug use: No   Sexual activity: Not on file  Other Topics Concern   Not on file  Social History Narrative   Has a significant other - lives alone - son lives in [place]   Social Determinants of Health   Financial Resource Strain: Low Risk  (01/30/2023)   Overall Financial Resource Strain (CARDIA)    Difficulty of Paying Living Expenses: Not hard at all  Food Insecurity: No Food Insecurity (03/23/2023)   Hunger Vital Sign    Worried About Running Out  of Food in the Last Year: Never true    Ran Out of Food in the Last Year: Never true  Transportation Needs: No Transportation Needs (03/23/2023)   PRAPARE - Administrator, Civil Service (Medical): No    Lack of Transportation (Non-Medical): No  Physical Activity: Sufficiently Active (01/30/2023)   Exercise Vital Sign    Days of Exercise per Week: 5 days    Minutes of Exercise per Session: 60 min  Recent Concern: Physical Activity - Insufficiently Active (01/24/2023)   Exercise Vital Sign    Days of Exercise per Week: 5 days    Minutes of  Exercise per Session: 10 min  Stress: No Stress Concern Present (01/30/2023)   [employer] of Occupational Health - Occupational Stress Questionnaire    Feeling of Stress : Not at all  Social Connections: Moderately Integrated (01/30/2023)   Social Connection and Isolation Panel [NHANES]    Frequency of Communication with Friends and Family: More than three times a week    Frequency of Social Gatherings with Friends and Family: More than three times a week    Attends Religious Services: More than 4 times per year    Active Member of [employer] or Organizations: No    Attends Banker Meetings: Never    Marital Status: Living with partner  Recent Concern: Social Connections - Moderately Isolated (01/24/2023)   Social Connection and Isolation Panel [NHANES]    Frequency of Communication with Friends and Family: More than three times a week    Frequency of Social Gatherings with Friends and Family: More than three times a week    Attends Religious Services: More than 4 times per year    Active Member of [employer] or Organizations: No    Attends Engineer, structural: Not on file    Marital Status: Never married   Family History  Problem Relation Age of Onset   Diabetes Mother    Stroke Father    Asthma Father    COPD Father    Aneurysm Father        AAA   Diabetes Brother    Stroke Brother        2008   Colon cancer Neg Hx    Heart disease Neg Hx    Rectal cancer Neg Hx    Stomach cancer Neg Hx    - negative except otherwise stated in the family history section No Known Allergies Prior to Admission medications   Medication Sig Start Date End Date Taking? Authorizing Provider  apixaban (ELIQUIS) 5 MG TABS tablet Take 1 tablet (5 mg total) by mouth 2 (two) times daily. 11/16/22  Yes Jonelle Sidle, MD  atorvastatin (LIPITOR) 20 MG tablet TAKE 1 TABLET BY MOUTH EVERY DAY 12/11/22  Yes Dettinger, Elige Radon, MD  fluticasone (FLONASE) 50 MCG/ACT nasal spray Place 1  spray into both nostrils 2 (two) times daily as needed for allergies or rhinitis. 01/08/19  Yes Dettinger, Elige Radon, MD  gabapentin (NEURONTIN) 600 MG tablet Take 1 tablet (600 mg total) by mouth 3 (three) times daily. Patient taking differently: Take 600 mg by mouth at bedtime. 04/24/22  Yes Dettinger, Elige Radon, MD  HYDROcodone-acetaminophen (NORCO) 10-325 MG tablet Take 1 tablet by mouth 2 (two) times daily as needed. Patient taking differently: Take 1 tablet by mouth 2 (two) times daily as needed for moderate pain. Take 1/2 to 1 tablet daily as needed 02/24/23  Yes Dettinger, Elige Radon, MD  insulin aspart (  NOVOLOG FLEXPEN) 100 UNIT/ML FlexPen Inject 5-10 Units into the skin 3 (three) times daily with meals. Patient taking differently: Inject 15-20 Units into the skin 3 (three) times daily with meals. 11/21/22  Yes Dettinger, Elige Radon, MD  insulin degludec (TRESIBA FLEXTOUCH) 100 UNIT/ML FlexTouch Pen Inject 72-76 Units into the skin daily.   Yes [provider]  metFORMIN (GLUCOPHAGE) 1000 MG tablet Take 1 tablet (1,000 mg total) by mouth 2 (two) times daily. 04/24/22  Yes Dettinger, Elige Radon, MD  metoprolol succinate (TOPROL-XL) 25 MG 24 hr tablet TAKE 1 TABLET BY MOUTH EVERY DAY 03/17/22  Yes Jonelle Sidle, MD  Semaglutide, 2 MG/DOSE, 8 MG/3ML SOPN Inject 2 mg as directed once a week. 11/21/22  Yes Dettinger, Elige Radon, MD  Continuous Blood Gluc Receiver (FREESTYLE LIBRE 2 READER) DEVI Use as directed to test blood sugar up to 6 times daily as directed. DX: E 11.9 09/22/21   Dettinger, Elige Radon, MD  Continuous Blood Gluc Sensor (FREESTYLE LIBRE 2 SENSOR) MISC Use as directed to test blood sugar up to 6 times daily as directed. DX: E 11.9 09/22/21   Dettinger, Elige Radon, MD   MR TIBIA FIBULA LEFT W WO CONTRAST  Result Date: 03/22/2023 CLINICAL DATA:  Osteomyelitis suspected. Below-knee amputation wound. EXAM: MRI OF LOWER LEFT EXTREMITY WITHOUT AND WITH CONTRAST TECHNIQUE: Multiplanar,  multisequence MR imaging of the left was performed both before and after administration of intravenous contrast. CONTRAST:  10mL GADAVIST GADOBUTROL 1 MMOL/ML IV SOLN COMPARISON:  Radiographs dated Mar 22, 2019 FINDINGS: Bones/Joint/Cartilage Status post left below-knee amputation. There is bone marrow edema of the tibia which extends from the amputation site proximally extending up to 10 cm. Residual fibula is normal in signal. Ligaments Knee ligaments appear intact. Muscles and Tendons There is marked edema of the flexor and extensor muscles suggesting myopathy/myositis. Soft tissues There is a skin wound and small abscess about the amputation site with peripherally enhancing borders measuring a proximally 1.4 x 2.5 x 1.5 cm. Marked subcutaneous soft tissue edema about the stump. IMPRESSION: 1. Status post left below-knee amputation with bone marrow edema of the tibia extending proximally up to 10 cm concerning for osteomyelitis. 2. Skin wound and small abscess about the amputation site measuring 1.4 x 2.5 x 1.5 cm. 3. Marked edema of the flexor and extensor muscles suggesting myopathy/myositis. Electronically Signed   By: Larose Hires D.O.   On: 03/22/2023 23:48   DG Tibia/Fibula Left  Result Date: 03/22/2023 CLINICAL DATA:  BKA wound EXAM: LEFT TIBIA AND FIBULA - 2 VIEW COMPARISON:  February 05, 2023 FINDINGS: Osteopenia. Status post below the knee amputation. There is slightly increased lucency and irregularity along the medial distal tibial surgical margin cortex. Smooth surgical margins of the distal fibula. No acute fracture or dislocation. No soft tissue air. IMPRESSION: Slightly increased lucency and irregularity along the medial distal tibial cortex at the surgical margin. This is nonspecific but could reflect early osteomyelitis. Electronically Signed   By: Meda Klinefelter M.D.   On: 03/22/2023 14:23   - pertinent xrays, CT, MRI studies were reviewed and independently interpreted  Positive ROS: All  other systems have been reviewed and were otherwise negative with the exception of those mentioned in the HPI and as above.  Physical Exam: General: Alert, no acute distress Psychiatric: Patient is competent for consent with normal mood and affect Lymphatic: No axillary or cervical lymphadenopathy Cardiovascular: No pedal edema Respiratory: No cyanosis, no use of accessory musculature GI: No  organomegaly, abdomen is soft and non-tender    Images:  @ENCIMAGES @  Labs:  Lab Results  Component Value Date   HGBA1C 7.9 (H) 03/23/2023   HGBA1C 7.9 (H) 01/25/2023   HGBA1C 8.1 (H) 10/25/2022   ESRSEDRATE 80 (H) 11/06/2019   ESRSEDRATE 43 (H) 10/22/2019   ESRSEDRATE 64 (H) 04/11/2017   CRP 40.7 (H) 10/22/2019   CRP 17.8 (H) 04/11/2017   CRP 3.0 10/05/2016   LABURIC 4.6 08/05/2016   LABURIC 4.4 07/11/2016   REPTSTATUS PENDING 03/23/2023   GRAMSTAIN  11/05/2019    FEW WBC PRESENT,BOTH PMN AND MONONUCLEAR ABUNDANT GRAM POSITIVE COCCI    CULT  03/23/2023    NO GROWTH < 12 HOURS Performed at [facility], [address] [place], [place] [address]    [facility] STAPHYLOCOCCUS AUREUS 11/05/2019    Lab Results  Component Value Date   ALBUMIN 4.0 02/05/2023   ALBUMIN 4.0 01/25/2023   ALBUMIN 4.3 10/25/2022   PREALBUMIN 9.6 (L) 04/11/2017   LABURIC 4.6 08/05/2016   LABURIC 4.4 07/11/2016        Latest Ref Rng & Units 03/23/2023    7:40 AM 03/22/2023    2:11 PM 02/05/2023    4:10 PM  CBC EXTENDED  WBC 4.0 - 10.5 K/uL 7.5  8.8  14.0   RBC 4.22 - 5.81 MIL/uL 4.65  4.90  5.44   Hemoglobin 13.0 - 17.0 g/dL 60.4  54.0  98.1   HCT 39.0 - 52.0 % 41.6  44.3  49.7   Platelets 150 - 400 K/uL 190  214  232   NEUT# 1.7 - 7.7 K/uL  7.0  10.5   Lymph# 0.7 - 4.0 K/uL  1.1  2.2     Neurologic: Patient does not have protective sensation bilateral lower extremities.   MUSCULOSKELETAL:   Skin: Examination there is callus over the left residual limb consistent with subsiding into his  socket.  The proximal tibia is not tender to palpation there is no redness or cellulitis.  Currently there is no drainage patient states that he has had some clear drainage.  Review of the right transtibial amputation there is no ulceration or callus skin.  White cell count 7.5 with a normal lymphocyte and neutrophil ratio.  Hemoglobin A1c 7.9.  Review of the MRI scan shows no abscess there is edema involving essentially the and Styer tibial shaft.  Assessment: Assessment: Acute blunt trauma left below-knee amputation with patient end bearing and subsiding into his socket with a 56-year-old socket.  Plan: Patient can discharge to home on oral doxycycline I will follow-up in the office in 1 week and get patient set up with [employer] for new socket liner materials and supplies.  Thank you for the consult and the opportunity to see Mr. Kindle Carver, MD [facility] [phone] 4:24 PM

## 2023-03-24 DIAGNOSIS — S81802A Unspecified open wound, left lower leg, initial encounter: Secondary | ICD-10-CM | POA: Diagnosis not present

## 2023-03-24 LAB — GLUCOSE, CAPILLARY: Glucose-Capillary: 179 mg/dL — ABNORMAL HIGH (ref 70–99)

## 2023-03-24 NOTE — Progress Notes (Addendum)
Pt has DC order. Code44 has been DC, cleared by Case Manager for DC. AVS was given and explained to pt, all questions were answered. Foam was applied on the LLE wound. Sister will drive the pt home.

## 2023-03-24 NOTE — Discharge Summary (Signed)
Physician Discharge Summary  BRITISH LENDER ZOX:[id number] DOB: 20-Sep-1966 DOA: 03/22/2023  PCP: Dettinger, Elige Radon, MD  Admit date: 03/22/2023 Discharge date: 03/24/2023  Admitted From: Home Disposition: Home  Recommendations for Outpatient Follow-up:  Follow up with Dr. Lajoyce Corners in 1 week  Discharge Condition: Stable CODE STATUS: Full code Diet recommendation: Carb modified diet  Brief/Interim Summary: [name] is a 57 y.o. male with medical history significant for osteomyelitis status post bilateral BKA, paroxysmal A-fib on Eliquis, type 2 diabetes, who initially presented to [facility] ED due to wound, erythema, pain, warmth on his left BKA stump for the past 2 days.  The wound has been draining serous fluid and pus, and has become more painful.  Treated with antibiotics cefadroxil x 1 week at the beginning of April.  Initially symptoms resolved and for the last few days they recurred.  Sent from [facility] ED to [facility] ED for left lower extremity MRI to evaluate osteomyelitis.   MRI of left lower extremity did reveal bone marrow edema of the tibia extending proximally, concerning for osteomyelitis, skin wound and small abscess at the amputation site, marked edema of flexor and extensor muscles suggesting myopathy/myositis.  Patient was started on broad-spectrum antibiotics vancomycin, cefepime and admitted to the hospital.  Orthopedic surgery consulted.  Patient was seen and examined by Dr. Lajoyce Corners.  He recommended discharge home on oral doxycycline with follow-up in office.  Patient remained clinically stable for discharge home.  Discharge Diagnoses:   Principal Problem:   Wound of left leg Active Problems:   Essential hypertension, benign   Neuropathy in diabetes (HCC)   Type 2 diabetes mellitus (HCC)   S/P BKA (below knee amputation) unilateral, left (HCC)   A-fib (HCC)   S/P BKA (below knee amputation) unilateral, right (HCC)   Dyslipidemia   Osteomyelitis  (HCC)   Discharge Instructions  Discharge Instructions     Call MD for:  extreme fatigue   Complete by: As directed    Call MD for:  hives   Complete by: As directed    Call MD for:  persistant dizziness or light-headedness   Complete by: As directed    Call MD for:  persistant nausea and vomiting   Complete by: As directed    Call MD for:  severe uncontrolled pain   Complete by: As directed    Call MD for:  temperature >100.4   Complete by: As directed    Diet Carb Modified   Complete by: As directed    Increase activity slowly   Complete by: As directed       Allergies as of 03/24/2023   No Known Allergies      Medication List     TAKE these medications    apixaban 5 MG Tabs tablet Commonly known as: ELIQUIS Take 1 tablet (5 mg total) by mouth 2 (two) times daily.   atorvastatin 20 MG tablet Commonly known as: LIPITOR TAKE 1 TABLET BY MOUTH EVERY DAY   doxycycline 100 MG capsule Commonly known as: VIBRAMYCIN Take 1 capsule (100 mg total) by mouth 2 (two) times daily for 7 days.   fluticasone 50 MCG/ACT nasal spray Commonly known as: FLONASE Place 1 spray into both nostrils 2 (two) times daily as needed for allergies or rhinitis.   FreeStyle Cesar Chavez 2 Reader Ericson Use as directed to test blood sugar up to 6 times daily as directed. DX: E 11.9   FreeStyle Libre 2 Sensor Misc Use as directed  to test blood sugar up to 6 times daily as directed. DX: E 11.9   gabapentin 600 MG tablet Commonly known as: NEURONTIN Take 1 tablet (600 mg total) by mouth 3 (three) times daily. What changed: when to take this   HYDROcodone-acetaminophen 10-325 MG tablet Commonly known as: NORCO Take 1 tablet by mouth 2 (two) times daily as needed. What changed:  reasons to take this additional instructions   metFORMIN 1000 MG tablet Commonly known as: GLUCOPHAGE Take 1 tablet (1,000 mg total) by mouth 2 (two) times daily.   metoprolol succinate 25 MG 24 hr tablet Commonly  known as: TOPROL-XL TAKE 1 TABLET BY MOUTH EVERY DAY   NovoLOG FlexPen 100 UNIT/ML FlexPen Generic drug: insulin aspart Inject 5-10 Units into the skin 3 (three) times daily with meals. What changed: how much to take   Semaglutide (2 MG/DOSE) 8 MG/3ML Sopn Inject 2 mg as directed once a week.   Evaristo Bury FlexTouch 100 UNIT/ML FlexTouch Pen Generic drug: insulin degludec Inject 72-76 Units into the skin daily.        Follow-up Information     Nadara Mustard, MD Follow up in 1 week(s).   Specialty: Orthopedic Surgery Contact information: [address] [place] [address] [phone]                No Known Allergies  Consultations: Orthopedic surgery   Procedures/Studies: MR TIBIA FIBULA LEFT W WO CONTRAST  Result Date: 03/22/2023 CLINICAL DATA:  Osteomyelitis suspected. Below-knee amputation wound. EXAM: MRI OF LOWER LEFT EXTREMITY WITHOUT AND WITH CONTRAST TECHNIQUE: Multiplanar, multisequence MR imaging of the left was performed both before and after administration of intravenous contrast. CONTRAST:  10mL GADAVIST GADOBUTROL 1 MMOL/ML IV SOLN COMPARISON:  Radiographs dated Mar 22, 2019 FINDINGS: Bones/Joint/Cartilage Status post left below-knee amputation. There is bone marrow edema of the tibia which extends from the amputation site proximally extending up to 10 cm. Residual fibula is normal in signal. Ligaments Knee ligaments appear intact. Muscles and Tendons There is marked edema of the flexor and extensor muscles suggesting myopathy/myositis. Soft tissues There is a skin wound and small abscess about the amputation site with peripherally enhancing borders measuring a proximally 1.4 x 2.5 x 1.5 cm. Marked subcutaneous soft tissue edema about the stump. IMPRESSION: 1. Status post left below-knee amputation with bone marrow edema of the tibia extending proximally up to 10 cm concerning for osteomyelitis. 2. Skin wound and small abscess about the amputation site  measuring 1.4 x 2.5 x 1.5 cm. 3. Marked edema of the flexor and extensor muscles suggesting myopathy/myositis. Electronically Signed   By: Larose Hires D.O.   On: 03/22/2023 23:48   DG Tibia/Fibula Left  Result Date: 03/22/2023 CLINICAL DATA:  BKA wound EXAM: LEFT TIBIA AND FIBULA - 2 VIEW COMPARISON:  February 05, 2023 FINDINGS: Osteopenia. Status post below the knee amputation. There is slightly increased lucency and irregularity along the medial distal tibial surgical margin cortex. Smooth surgical margins of the distal fibula. No acute fracture or dislocation. No soft tissue air. IMPRESSION: Slightly increased lucency and irregularity along the medial distal tibial cortex at the surgical margin. This is nonspecific but could reflect early osteomyelitis. Electronically Signed   By: Meda Klinefelter M.D.   On: 03/22/2023 14:23       Discharge Exam: Vitals:   03/24/23 0419 03/24/23 0731  BP: 124/82 132/78  Pulse: 82 88  Resp: 20   Temp: 98 F (36.7 C) 98.3 F (36.8 C)  SpO2:  96% 94%    General: Pt is alert, awake, not in acute distress Cardiovascular: RRR, S1/S2 +, no edema Respiratory: CTA bilaterally, no wheezing, no rhonchi, no respiratory distress, no conversational dyspnea  Abdominal: Soft, NT, ND, bowel sounds + Extremities: Bilateral BKA Psych: Normal mood and affect, stable judgement and insight     The results of significant diagnostics from this hospitalization (including imaging, microbiology, ancillary and laboratory) are listed below for reference.     Microbiology: Recent Results (from the past 240 hour(s))  Blood culture (routine x 2)     Status: None (Preliminary result)   Collection Time: 03/23/23 12:25 AM   Specimen: BLOOD LEFT HAND  Result Value Ref Range Status   Specimen Description BLOOD LEFT HAND  Final   Special Requests   Final    BOTTLES DRAWN AEROBIC AND ANAEROBIC Blood Culture adequate volume   Culture   Final    NO GROWTH 1 DAY Performed at [facility], [address] [place], [place] [address]    Report Status PENDING  Incomplete  Blood culture (routine x 2)     Status: None (Preliminary result)   Collection Time: 03/23/23 12:30 AM   Specimen: BLOOD  Result Value Ref Range Status   Specimen Description BLOOD LEFT ANTECUBITAL  Final   Special Requests   Final    BOTTLES DRAWN AEROBIC AND ANAEROBIC Blood Culture results may not be optimal due to an excessive volume of blood received in culture bottles   Culture   Final    NO GROWTH 1 DAY Performed at [facility], [address], [place], [place] [address]    Report Status PENDING  Incomplete  MRSA Next Gen by PCR, Nasal     Status: None   Collection Time: 03/23/23  2:35 AM   Specimen: Nasal Mucosa; Nasal Swab  Result Value Ref Range Status   MRSA by PCR Next Gen NOT DETECTED NOT DETECTED Final    Comment: (NOTE) The GeneXpert MRSA Assay (FDA approved for NASAL specimens only), is one component of a comprehensive MRSA colonization surveillance program. It is not intended to diagnose MRSA infection nor to guide or monitor treatment for MRSA infections. Test performance is not FDA approved in patients less than 73 years old. Performed at [facility], [address] [place], [place] [address]      Labs: BNP (last 3 results) No results for input(s): "BNP" in the last 8760 hours. Basic Metabolic Panel: Recent Labs  Lab 03/22/23 1411 03/23/23 0740  NA 131* 133*  K 4.6 4.2  CL 94* 98  CO2 27 26  GLUCOSE 366* 118*  BUN 18 16  CREATININE 1.03 1.07  CALCIUM 8.7* 8.6*  MG  --  1.5*  PHOS  --  3.2   Liver Function Tests: No results for input(s): "AST", "ALT", "ALKPHOS", "BILITOT", "PROT", "ALBUMIN" in the last 168 hours. No results for input(s): "LIPASE", "AMYLASE" in the last 168 hours. No results for input(s): "AMMONIA" in the last 168 hours. CBC: Recent Labs  Lab 03/22/23 1411 03/23/23 0740  WBC 8.8 7.5  NEUTROABS 7.0  --   HGB 15.2  14.4  HCT 44.3 41.6  MCV 90.4 89.5  PLT 214 190   Cardiac Enzymes: No results for input(s): "CKTOTAL", "CKMB", "CKMBINDEX", "TROPONINI" in the last 168 hours. BNP: Invalid input(s): "POCBNP" CBG: Recent Labs  Lab 03/23/23 0727 03/23/23 1149 03/23/23 1601 03/23/23 2041 03/24/23 0732  GLUCAP 113* 106* 91 201* 179*  D-Dimer No results for input(s): "DDIMER" in the last 72 hours. Hgb A1c Recent Labs    03/23/23 0740  HGBA1C 7.9*   Lipid Profile No results for input(s): "CHOL", "HDL", "LDLCALC", "TRIG", "CHOLHDL", "LDLDIRECT" in the last 72 hours. Thyroid function studies No results for input(s): "TSH", "T4TOTAL", "T3FREE", "THYROIDAB" in the last 72 hours.  Invalid input(s): "FREET3" Anemia work up No results for input(s): "VITAMINB12", "FOLATE", "FERRITIN", "TIBC", "IRON", "RETICCTPCT" in the last 72 hours. Urinalysis    Component Value Date/Time   COLORURINE YELLOW 10/22/2019 1508   APPEARANCEUR HAZY (A) 10/22/2019 1508   LABSPEC 1.017 10/22/2019 1508   PHURINE 5.0 10/22/2019 1508   GLUCOSEU >=500 (A) 10/22/2019 1508   HGBUR SMALL (A) 10/22/2019 1508   BILIRUBINUR NEGATIVE 10/22/2019 1508   BILIRUBINUR negative 03/26/2014 1212   KETONESUR 5 (A) 10/22/2019 1508   PROTEINUR NEGATIVE 10/22/2019 1508   UROBILINOGEN 0.2 06/13/2014 1048   NITRITE NEGATIVE 10/22/2019 1508   LEUKOCYTESUR NEGATIVE 10/22/2019 1508   Sepsis Labs Recent Labs  Lab 03/22/23 1411 03/23/23 0740  WBC 8.8 7.5   Microbiology Recent Results (from the past 240 hour(s))  Blood culture (routine x 2)     Status: None (Preliminary result)   Collection Time: 03/23/23 12:25 AM   Specimen: BLOOD LEFT HAND  Result Value Ref Range Status   Specimen Description BLOOD LEFT HAND  Final   Special Requests   Final    BOTTLES DRAWN AEROBIC AND ANAEROBIC Blood Culture adequate volume   Culture   Final    NO GROWTH 1 DAY Performed at [facility], [address], [place], [place] [address]     Report Status PENDING  Incomplete  Blood culture (routine x 2)     Status: None (Preliminary result)   Collection Time: 03/23/23 12:30 AM   Specimen: BLOOD  Result Value Ref Range Status   Specimen Description BLOOD LEFT ANTECUBITAL  Final   Special Requests   Final    BOTTLES DRAWN AEROBIC AND ANAEROBIC Blood Culture results may not be optimal due to an excessive volume of blood received in culture bottles   Culture   Final    NO GROWTH 1 DAY Performed at [facility], [address], [place], [place] [address]    Report Status PENDING  Incomplete  MRSA Next Gen by PCR, Nasal     Status: None   Collection Time: 03/23/23  2:35 AM   Specimen: Nasal Mucosa; Nasal Swab  Result Value Ref Range Status   MRSA by PCR Next Gen NOT DETECTED NOT DETECTED Final    Comment: (NOTE) The GeneXpert MRSA Assay (FDA approved for NASAL specimens only), is one component of a comprehensive MRSA colonization surveillance program. It is not intended to diagnose MRSA infection nor to guide or monitor treatment for MRSA infections. Test performance is not FDA approved in patients less than 23 years old. Performed at [facility], [address] [place], [place] [address]      Patient was seen and examined on the day of discharge and was found to be in stable condition. Time coordinating discharge: 25 minutes including assessment and coordination of care, as well as examination of the patient.   SIGNED:  Noralee Stain, DO [facility] 03/24/2023, 10:12 AM

## 2023-03-26 ENCOUNTER — Telehealth: Payer: Self-pay | Admitting: *Deleted

## 2023-03-26 ENCOUNTER — Encounter: Payer: Self-pay | Admitting: *Deleted

## 2023-03-26 ENCOUNTER — Ambulatory Visit: Payer: Medicare Other | Admitting: Nurse Practitioner

## 2023-03-26 ENCOUNTER — Ambulatory Visit: Payer: Self-pay

## 2023-03-26 NOTE — Transitions of Care (Post Inpatient/ED Visit) (Signed)
03/26/2023  Name: [name] MRN: [id number] DOB: [birth year]  Today's TOC FU Call Status: Today's TOC FU Call Status:: Successful TOC FU Call Competed TOC FU Call Complete Date: 03/26/23  Transition Care Management Follow-up Telephone Call Date of Discharge: 03/24/23 Discharge Facility: [facility]) Type of Discharge: Inpatient Admission Primary Inpatient Discharge Diagnosis:: (L) stump wound/ infection-- bilateral BKA How have you been since you were released from the hospital?: Better ("I am doing much better and the wound looks much better.  I am back to doing everything on my own without a problem.  I am taking the antibiotics as prescribed") Any questions or concerns?: No  Items Reviewed: Did you receive and understand the discharge instructions provided?: Yes (thoroughly reviewed with patient who verbalizes good understanding of same) Medications obtained,verified, and reconciled?: Yes (Medications Reviewed) (Full medication reconciliation/ review completed; no concerns or discrepancies identified; confirmed patient obtained/ is taking all newly Rx'd medications as instructed; self-manages medications and denies questions/ concerns around medications today) Any new allergies since your discharge?: No Dietary orders reviewed?: Yes Type of Diet Ordered:: Diabetic Do you have support at home?: Yes People in Home: alone Name of Support/Comfort Primary Source: Reports independent in self-care activities; fiancee and sister in law assists as/ if needed/ indicated  Medications Reviewed Today: Medications Reviewed Today     Reviewed by Michaela Corner, RN (Registered Nurse) on 03/26/23 at 1556  Med List Status: <None>   Medication Order Taking? Sig Documenting Provider Last Dose Status Informant  apixaban (ELIQUIS) 5 MG TABS tablet [id number] Yes Take 1 tablet (5 mg total) by mouth 2 (two) times daily. Jonelle Sidle, MD Taking Active Self, Pharmacy Records  atorvastatin  (LIPITOR) 20 MG tablet [id number] Yes TAKE 1 TABLET BY MOUTH EVERY DAY Dettinger, Elige Radon, MD Taking Active Self, Pharmacy Records  Continuous Blood Gluc Receiver (FREESTYLE LIBRE 2 READER) DEVI [id number] Yes Use as directed to test blood sugar up to 6 times daily as directed. DX: E 11.9 Dettinger, Elige Radon, MD Taking Active Self, Pharmacy Records           Med Note Cresenciano Genre, Okey Dupre Nov 17, 2021 10:56 AM) [facility] [place]   Continuous Blood Gluc Sensor (FREESTYLE LIBRE 2 SENSOR) MISC [id number] Yes Use as directed to test blood sugar up to 6 times daily as directed. DX: E 11.9 Dettinger, Elige Radon, MD Taking Active Self, Pharmacy Records           Med Note Cresenciano Genre, Okey Dupre Nov 17, 2021 10:56 AM) [facility] [place] [facility]  doxycycline (VIBRAMYCIN) 100 MG capsule [id number] Yes Take 1 capsule (100 mg total) by mouth 2 (two) times daily for 7 days. Noralee Stain, DO Taking Active   fluticasone [facility]) 50 MCG/ACT nasal spray [id number] Yes Place 1 spray into both nostrils 2 (two) times daily as needed for allergies or rhinitis. Dettinger, Elige Radon, MD Taking Active Self, Pharmacy Records           Med Note Marilu Favre Mar 26, 2023  3:54 PM) 03/26/23- reports has not needed recently  gabapentin (NEURONTIN) 600 MG tablet [id number] Yes Take 1 tablet (600 mg total) by mouth 3 (three) times daily.  Patient taking differently: Take 600 mg by mouth at bedtime.   Dettinger, Elige Radon, MD Taking Active Self, Pharmacy Records           Med Note [place], DAWN S  Thu Mar 22, 2023  3:25 PM) 1200 mg last night 03/21/23  HYDROcodone-acetaminophen (NORCO) 10-325 MG tablet [id number] Yes Take 1 tablet by mouth 2 (two) times daily as needed.  Patient taking differently: Take 1 tablet by mouth 2 (two) times daily as needed for moderate pain. Take 1/2 to 1 tablet daily as needed   Dettinger, Elige Radon, MD Taking Active Self, Pharmacy Records  insulin aspart  (NOVOLOG FLEXPEN) 100 UNIT/ML FlexPen [id number] Yes Inject 5-10 Units into the skin 3 (three) times daily with meals.  Patient taking differently: Inject 15-20 Units into the skin 3 (three) times daily with meals.   Dettinger, Elige Radon, MD Taking Active Self, Pharmacy Records           Med Note Cresenciano Genre, Lilla Shook   Tue Feb 20, 2023  9:43 AM) Via novo nordisk patient assistance program    insulin degludec (TRESIBA FLEXTOUCH) 100 UNIT/ML FlexTouch Pen [id number] Yes Inject 72-76 Units into the skin daily. [provider] Taking Active Self, Pharmacy Records           Med Note Cresenciano Genre, Lilla Shook   Tue Jun 28, 2021 11:55 AM) VIA NOVO [employer] PATIENT ASSISTANCE PROGRAM  metFORMIN (GLUCOPHAGE) 1000 MG tablet [id number] Yes Take 1 tablet (1,000 mg total) by mouth 2 (two) times daily. Dettinger, Elige Radon, MD Taking Active Self, Pharmacy Records  metoprolol succinate (TOPROL-XL) 25 MG 24 hr tablet [id number] Yes TAKE 1 TABLET BY MOUTH EVERY DAY Jonelle Sidle, MD Taking Active Self, Pharmacy Records  Semaglutide, 2 MG/DOSE, 8 MG/3ML [facility] [id number] Yes Inject 2 mg as directed once a week. Dettinger, Elige Radon, MD Taking Active Self, Pharmacy Records           Med Note Cresenciano Genre, Lilla Shook   Tue Feb 20, 2023  9:00 AM) Via novo nordisk patient assistance program              Home Care and Equipment/Supplies: Were Home Health Services Ordered?: No Any new equipment or medical supplies ordered?: No  Functional Questionnaire: Do you need assistance with bathing/showering or dressing?: No Do you need assistance with meal preparation?: No Do you need assistance with eating?: No Do you have difficulty maintaining continence: No Do you need assistance with getting out of bed/getting out of a chair/moving?: No Do you have difficulty managing or taking your medications?: No  Follow up appointments reviewed: PCP Follow-up appointment confirmed?: NA (verified not indicated per hospital discharging  provider discharge notes) Specialist Hospital Follow-up appointment confirmed?: Yes Date of Specialist follow-up appointment?: 03/29/23 Follow-Up Specialty Provider:: Dr. Lajoyce Corners, orthopedic provider Do you need transportation to your follow-up appointment?: No Do you understand care options if your condition(s) worsen?: Yes-patient verbalized understanding  SDOH Interventions Today    Flowsheet Row Most Recent Value  SDOH Interventions   Food Insecurity Interventions Intervention Not Indicated  Transportation Interventions Intervention Not Indicated  [family/ girlfriend provides transportation]      TOC Interventions Today    Flowsheet Row Most Recent Value  TOC Interventions   TOC Interventions Discussed/Reviewed TOC Interventions Discussed      Interventions Today    Flowsheet Row Most Recent Value  Chronic Disease   Chronic disease during today's visit Other  [(L) stump wound/ infection]  General Interventions   General Interventions Discussed/Reviewed General Interventions Discussed, Doctor Visits, Durable Medical Equipment (DME)  Doctor Visits Discussed/Reviewed Specialist, Doctor Visits Discussed, PCP  Durable Medical Equipment (DME) Wheelchair  [currently using manual wheelchair while (L) stump wound heals]  Wheelchair Standard  PCP/Specialist Visits Compliance with follow-up visit  Communication with PCP/Specialists  Nutrition Interventions   Nutrition Discussed/Reviewed Nutrition Discussed  Pharmacy Interventions   Pharmacy Dicussed/Reviewed Pharmacy Topics Discussed  [Full medication review with updating medication list in EHR per patient report]      Caryl Pina, RN, BSN, CCRN Alumnus RN CM Care Coordination/ Transition of Care- [facility] Care Management [phone]: direct office

## 2023-03-26 NOTE — Chronic Care Management (AMB) (Signed)
   03/26/2023  [name] May 07, 1966 [id number]   Reason for Encounter: Patient is not currently enrolled in the CCM program. CCM status changed to previously enrolled.   [name]/Chronic Care Management [phone]

## 2023-03-28 LAB — CULTURE, BLOOD (ROUTINE X 2)
Culture: NO GROWTH
Culture: NO GROWTH
Special Requests: ADEQUATE

## 2023-03-29 ENCOUNTER — Encounter: Payer: Self-pay | Admitting: Orthopedic Surgery

## 2023-03-29 ENCOUNTER — Ambulatory Visit (INDEPENDENT_AMBULATORY_CARE_PROVIDER_SITE_OTHER): Payer: Medicare Other | Admitting: Orthopedic Surgery

## 2023-03-29 DIAGNOSIS — Z89511 Acquired absence of right leg below knee: Secondary | ICD-10-CM

## 2023-03-29 DIAGNOSIS — Z89512 Acquired absence of left leg below knee: Secondary | ICD-10-CM

## 2023-03-29 NOTE — Progress Notes (Signed)
Office Visit Note   Patient: [name]           Date of Birth: 1966-01-24           MRN: [id number] Visit Date: 03/29/2023              Requested by: Dettinger, Elige Radon, MD [address] [place],  [place] [address] PCP: Dettinger, Elige Radon, MD  Chief Complaint  Patient presents with   Left Leg - Follow-up    ER follow up 03/22/2023. HX Left BKA 2019       HPI: Patient is a 57 year old gentleman status post bilateral transtibial amputations.  Patient recently went to the emergency room after a fall with an ulcer over the residual limb on the left.  Patient is still on doxycycline twice a day.  Patient has broken sockets with the torn liners.  Assessment & Plan: Visit Diagnoses:  1. S/P bilateral below knee amputation (HCC)     Plan: Patient was provided a prescription for new sockets liners materials and supplies.  He was given a size medium under liner to wear around-the-clock he is not to wear his prosthesis until the wound is healed.  Follow-Up Instructions: Return if symptoms worsen or fail to improve.   Ortho Exam  Patient is alert, oriented, no adenopathy, well-dressed, normal affect, normal respiratory effort. Examination patient has serosanguineous drainage from the residual ulcer from the blunt trauma.  There is no surrounding cellulitis there is no swelling.  Patient was placed in a prosthetic under liner with 4 x 4 gauze and Ace wrap on top.  Patient is an existing bilateral transtibial  amputee.  Patient's current comorbidities are not expected to impact the ability to function with the prescribed prosthesis. Patient verbally communicates a strong desire to use a prosthesis. Patient currently requires mobility aids to ambulate without a prosthesis.  Expects not to use mobility aids with a new prosthesis.  Patient is a K3 level ambulator that spends a lot of time walking around on uneven terrain over obstacles, up and down stairs, and ambulates with a variable  cadence.     Imaging: No results found. No images are attached to the encounter.  Labs: Lab Results  Component Value Date   HGBA1C 7.9 (H) 03/23/2023   HGBA1C 7.9 (H) 01/25/2023   HGBA1C 8.1 (H) 10/25/2022   ESRSEDRATE 80 (H) 11/06/2019   ESRSEDRATE 43 (H) 10/22/2019   ESRSEDRATE 64 (H) 04/11/2017   CRP 40.7 (H) 10/22/2019   CRP 17.8 (H) 04/11/2017   CRP 3.0 10/05/2016   LABURIC 4.6 08/05/2016   LABURIC 4.4 07/11/2016   REPTSTATUS 03/28/2023 FINAL 03/23/2023   GRAMSTAIN  11/05/2019    FEW WBC PRESENT,BOTH PMN AND MONONUCLEAR ABUNDANT GRAM POSITIVE COCCI    CULT  03/23/2023    NO GROWTH 5 DAYS Performed at [facility], [address] [place], [place] [address]    [facility] STAPHYLOCOCCUS AUREUS 11/05/2019     Lab Results  Component Value Date   ALBUMIN 4.0 02/05/2023   ALBUMIN 4.0 01/25/2023   ALBUMIN 4.3 10/25/2022   PREALBUMIN 9.6 (L) 04/11/2017    Lab Results  Component Value Date   MG 1.5 (L) 03/23/2023   MG 1.8 02/23/2020   MG 1.6 (L) 02/22/2020   No results found for: "VD25OH"  Lab Results  Component Value Date   PREALBUMIN 9.6 (L) 04/11/2017      Latest Ref Rng & Units 03/23/2023    7:40 AM  03/22/2023    2:11 PM 02/05/2023    4:10 PM  CBC EXTENDED  WBC 4.0 - 10.5 K/uL 7.5  8.8  14.0   RBC 4.22 - 5.81 MIL/uL 4.65  4.90  5.44   Hemoglobin 13.0 - 17.0 g/dL 16.1  09.6  04.5   HCT 39.0 - 52.0 % 41.6  44.3  49.7   Platelets 150 - 400 K/uL 190  214  232   NEUT# 1.7 - 7.7 K/uL  7.0  10.5   Lymph# 0.7 - 4.0 K/uL  1.1  2.2      There is no height or weight on file to calculate BMI.  Orders:  No orders of the defined types were placed in this encounter.  No orders of the defined types were placed in this encounter.    Procedures: No procedures performed  Clinical Data: No additional findings.  ROS:  All other systems negative, except as noted in the HPI. Review of Systems  Objective: Vital Signs: There were no vitals taken for  this visit.  Specialty Comments:  No specialty comments available.  PMFS History: Patient Active Problem List   Diagnosis Date Noted   Osteomyelitis (HCC) 03/23/2023   Wound of left leg 03/23/2023   Dyslipidemia 07/14/2021   S/P BKA (below knee amputation) unilateral, right (HCC) 02/19/2020   Osteomyelitis of ankle and foot (HCC)    Abscess of tendon sheath, right ankle and foot 12/24/2019   Bacteremia due to methicillin susceptible Staphylococcus aureus (MSSA) 12/24/2019   History of DVT (deep vein thrombosis) 10/26/2019   A-fib (HCC) 10/22/2019   Lumbar spine scoliosis 08/13/2018   S/P BKA (below knee amputation) unilateral, left (HCC) 01/12/2018   Pain management contract signed 05/04/2017   Type 2 diabetes mellitus (HCC) 07/22/2015   Neuropathy in diabetes (HCC) 03/31/2014   Essential hypertension, benign 03/03/2013   DDD (degenerative disc disease), lumbar 03/03/2013   Past Medical History:  Diagnosis Date   Charcot ankle    Essential hypertension    Osteomyelitis of toe of right foot (HCC)    Paroxysmal atrial fibrillation (HCC)    Recurrent boils    Type 2 diabetes mellitus with diabetic neuropathy (HCC)    Diagnosed age 73    Family History  Problem Relation Age of Onset   Diabetes Mother    Stroke Father    Asthma Father    COPD Father    Aneurysm Father        AAA   Diabetes Brother    Stroke Brother        2008   Colon cancer Neg Hx    Heart disease Neg Hx    Rectal cancer Neg Hx    Stomach cancer Neg Hx     Past Surgical History:  Procedure Laterality Date   AMPUTATION Right 06/17/2014   Procedure: PARTIAL AMPUTATION RIGHT 3RD TOE;  Surgeon: [name], DPM;  Location: [facility];  Service: Podiatry;  Laterality: Right;   AMPUTATION Right 09/02/2014   Procedure: PARTIAL AMPUTATION 2ND TOE RIGHT FOOT;  Surgeon: [name], DPM;  Location: [facility];  Service: Podiatry;  Laterality: Right;   AMPUTATION Right 04/01/2015   Procedure:  AMPUTATION DIGIT 1ST TOE RIGHT FOOT;  Surgeon: Laurell Josephs, DPM;  Location: [facility];  Service: Podiatry;  Laterality: Right;   AMPUTATION Right 06/29/2016   pinkeye    AMPUTATION Right 10/24/2019   Procedure: PARTIAL THIRD RAY AMPUTATION;  Surgeon: Ferman Hamming, DPM;  Location: [facility];  Service:  Podiatry;  Laterality: Right;   AMPUTATION Right 02/19/2020   Procedure: AMPUTATION BELOW KNEE; RIGHT;  Surgeon: [name], MD;  Location: [facility];  Service: General;  Laterality: Right;   FOOT AMPUTATION Left    FOOT SURGERY Left 03/2017   HERNIA REPAIR Bilateral    and 3rd hernia repair does not remember ehat side   IRRIGATION AND DEBRIDEMENT ABSCESS Right 11/05/2019   Procedure: IRRIGATION AND DRAINAGE ABSCESS RIGHT ANKLE;  Surgeon: Ferman Hamming, DPM;  Location: [facility];  Service: Podiatry;  Laterality: Right;   TEE WITHOUT CARDIOVERSION N/A 10/29/2019   Procedure: TRANSESOPHAGEAL ECHOCARDIOGRAM (TEE) WITH PROPOFOL;  Surgeon: Pricilla Riffle, MD;  [facility];  Service: Cardiovascular;  Laterality: N/A;   Social History   Occupational History   Occupation: Receiving  Tobacco Use   Smoking status: Never   Smokeless tobacco: Never  Vaping Use   Vaping Use: Never used  Substance and Sexual Activity   Alcohol use: No   Drug use: No   Sexual activity: Not on file

## 2023-04-09 ENCOUNTER — Other Ambulatory Visit: Payer: Self-pay | Admitting: Cardiology

## 2023-04-16 ENCOUNTER — Other Ambulatory Visit: Payer: Self-pay | Admitting: Family Medicine

## 2023-04-16 DIAGNOSIS — M5136 Other intervertebral disc degeneration, lumbar region: Secondary | ICD-10-CM

## 2023-04-16 DIAGNOSIS — Z0289 Encounter for other administrative examinations: Secondary | ICD-10-CM

## 2023-04-16 DIAGNOSIS — Z89512 Acquired absence of left leg below knee: Secondary | ICD-10-CM

## 2023-04-16 NOTE — Telephone Encounter (Signed)
  Prescription Request  04/16/2023  Is this a "Controlled Substance" medicine?   Have you seen your PCP in the last 2 weeks? LOV 01/25/23 NOV 05/02/23  If YES, route message to pool  -  If NO, patient needs to be scheduled for appointment.  What is the name of the medication or equipment?  HYDROcodone-acetaminophen (NORCO) 10-325 MG tablet   Have you contacted your pharmacy to request a refill? yes   Which pharmacy would you like this sent to? [facility] [place]    Patient notified that their request is being sent to the clinical staff for review and that they should receive a response within 2 business days.       *Patient also needs samples of Eliquis

## 2023-04-16 NOTE — Progress Notes (Signed)
Per [facility] in [place] pt last picked up his hydrocodone on 5/7. Pharmacy states that they do not have any refills on file for him.

## 2023-04-16 NOTE — Progress Notes (Unsigned)
3 prescriptions were sent for him on 01/25/2023, it looks like he is filled 2 of them, he should still have another 1 at the pharmacy.  Let me know if he does not but he should still have a third prescription at the pharmacy unless it was discontinued by the hospital, maybe that is why it does not show up there.  If that is the case then please send it to the pharmacy division as well as well as the hospitalist and saying they cannot discontinue multiple prescriptions, looks like Noralee Stain may be the person that discontinued a prescription   Sent prescription for the patient but we should really figure out what happened to the third prescription that we sent and why it was canceled by the pharmacy

## 2023-04-18 MED ORDER — HYDROCODONE-ACETAMINOPHEN 10-325 MG PO TABS
1.0000 | ORAL_TABLET | Freq: Two times a day (BID) | ORAL | 0 refills | Status: DC | PRN
Start: 2023-04-18 — End: 2023-05-02

## 2023-04-18 NOTE — Telephone Encounter (Signed)
Please look at my notes last time, again it looks like he has refilled it twice, at last visit I did send in 3 prescriptions.  There should be another prescription at the pharmacy, please check with the pharmacy as to why they do not have a third

## 2023-04-19 ENCOUNTER — Other Ambulatory Visit: Payer: Self-pay | Admitting: Family Medicine

## 2023-04-19 DIAGNOSIS — E1140 Type 2 diabetes mellitus with diabetic neuropathy, unspecified: Secondary | ICD-10-CM

## 2023-04-19 DIAGNOSIS — E785 Hyperlipidemia, unspecified: Secondary | ICD-10-CM

## 2023-04-20 ENCOUNTER — Telehealth: Payer: Self-pay | Admitting: Family Medicine

## 2023-04-20 NOTE — Telephone Encounter (Signed)
Pt requesting samples of Eliquis °

## 2023-04-20 NOTE — Telephone Encounter (Signed)
Patient has samples of front. Both Eliquis and diabetic needles from pt assistance.  Tried calling pt. No answer and vmail is full.

## 2023-04-23 ENCOUNTER — Ambulatory Visit: Payer: Medicare Other | Admitting: Nurse Practitioner

## 2023-04-30 ENCOUNTER — Ambulatory Visit (INDEPENDENT_AMBULATORY_CARE_PROVIDER_SITE_OTHER): Payer: Medicare Other | Admitting: Orthopedic Surgery

## 2023-04-30 DIAGNOSIS — Z89512 Acquired absence of left leg below knee: Secondary | ICD-10-CM | POA: Diagnosis not present

## 2023-04-30 DIAGNOSIS — Z89511 Acquired absence of right leg below knee: Secondary | ICD-10-CM | POA: Diagnosis not present

## 2023-05-01 ENCOUNTER — Encounter: Payer: Self-pay | Admitting: Orthopedic Surgery

## 2023-05-01 NOTE — Progress Notes (Signed)
Office Visit Note   Patient: [name]           Date of Birth: [birth year]           MRN: [id number] Visit Date: 04/30/2023              Requested by: Dettinger, Elige Radon, MD [address] [place],  [place] [address] PCP: Dettinger, Elige Radon, MD  Chief Complaint  Patient presents with   Left Leg - Wound Check      HPI: Patient is a 57 year old gentleman status post bilateral below-knee amputations.  Patient was in the emergency room May 16 with a recent fall.  Patient was in the office May 23 and patient was referred for a new socket and liner after the ulcer is healed.  Assessment & Plan: Visit Diagnoses:  1. S/P bilateral below knee amputation (HCC)     Plan: Patient continues to improve the distal ulcer.  He has a new medium prosthetic under liner.  He will follow-up with [facility] for new socket.  Follow-Up Instructions: No follow-ups on file.   Ortho Exam  Patient is alert, oriented, no adenopathy, well-dressed, normal affect, normal respiratory effort. Examination the ulcer is almost completely healed he does have some indurated skin.  There is no cellulitis no odor no drainage no depth to the wound.  Patient has a prescription for new socket from [facility].  Imaging: No results found. No images are attached to the encounter.  Labs: Lab Results  Component Value Date   HGBA1C 7.9 (H) 03/23/2023   HGBA1C 7.9 (H) 01/25/2023   HGBA1C 8.1 (H) 10/25/2022   ESRSEDRATE 80 (H) 11/06/2019   ESRSEDRATE 43 (H) 10/22/2019   ESRSEDRATE 64 (H) 04/11/2017   CRP 40.7 (H) 10/22/2019   CRP 17.8 (H) 04/11/2017   CRP 3.0 10/05/2016   LABURIC 4.6 08/05/2016   LABURIC 4.4 07/11/2016   REPTSTATUS 03/28/2023 FINAL 03/23/2023   GRAMSTAIN  11/05/2019    FEW WBC PRESENT,BOTH PMN AND MONONUCLEAR ABUNDANT GRAM POSITIVE COCCI    CULT  03/23/2023    NO GROWTH 5 DAYS Performed at [facility], [address] [place], [place] [address]    [facility] STAPHYLOCOCCUS AUREUS 11/05/2019      Lab Results  Component Value Date   ALBUMIN 4.0 02/05/2023   ALBUMIN 4.0 01/25/2023   ALBUMIN 4.3 10/25/2022   PREALBUMIN 9.6 (L) 04/11/2017    Lab Results  Component Value Date   MG 1.5 (L) 03/23/2023   MG 1.8 02/23/2020   MG 1.6 (L) 02/22/2020   No results found for: "VD25OH"  Lab Results  Component Value Date   PREALBUMIN 9.6 (L) 04/11/2017      Latest Ref Rng & Units 03/23/2023    7:40 AM 03/22/2023    2:11 PM 02/05/2023    4:10 PM  CBC EXTENDED  WBC 4.0 - 10.5 K/uL 7.5  8.8  14.0   RBC 4.22 - 5.81 MIL/uL 4.65  4.90  5.44   Hemoglobin 13.0 - 17.0 g/dL 82.9  56.2  13.0   HCT 39.0 - 52.0 % 41.6  44.3  49.7   Platelets 150 - 400 K/uL 190  214  232   NEUT# 1.7 - 7.7 K/uL  7.0  10.5   Lymph# 0.7 - 4.0 K/uL  1.1  2.2      There is no height or weight on file to calculate BMI.  Orders:  No orders of the defined types were placed in  this encounter.  No orders of the defined types were placed in this encounter.    Procedures: No procedures performed  Clinical Data: No additional findings.  ROS:  All other systems negative, except as noted in the HPI. Review of Systems  Objective: Vital Signs: There were no vitals taken for this visit.  Specialty Comments:  No specialty comments available.  PMFS History: Patient Active Problem List   Diagnosis Date Noted   Osteomyelitis (HCC) 03/23/2023   Wound of left leg 03/23/2023   Dyslipidemia 07/14/2021   S/P BKA (below knee amputation) unilateral, right (HCC) 02/19/2020   Osteomyelitis of ankle and foot (HCC)    Abscess of tendon sheath, right ankle and foot 12/24/2019   Bacteremia due to methicillin susceptible Staphylococcus aureus (MSSA) 12/24/2019   History of DVT (deep vein thrombosis) 10/26/2019   A-fib (HCC) 10/22/2019   Lumbar spine scoliosis 08/13/2018   S/P BKA (below knee amputation) unilateral, left (HCC) 01/12/2018   Pain management contract signed 05/04/2017   Type 2 diabetes mellitus  (HCC) 07/22/2015   Neuropathy in diabetes (HCC) 03/31/2014   Essential hypertension, benign 03/03/2013   DDD (degenerative disc disease), lumbar 03/03/2013   Past Medical History:  Diagnosis Date   Charcot ankle    Essential hypertension    Osteomyelitis of toe of right foot (HCC)    Paroxysmal atrial fibrillation (HCC)    Recurrent boils    Type 2 diabetes mellitus with diabetic neuropathy (HCC)    Diagnosed age 20    Family History  Problem Relation Age of Onset   Diabetes Mother    Stroke Father    Asthma Father    COPD Father    Aneurysm Father        AAA   Diabetes Brother    Stroke Brother        2008   Colon cancer Neg Hx    Heart disease Neg Hx    Rectal cancer Neg Hx    Stomach cancer Neg Hx     Past Surgical History:  Procedure Laterality Date   AMPUTATION Right 06/17/2014   Procedure: PARTIAL AMPUTATION RIGHT 3RD TOE;  Surgeon: [name], DPM;  Location: [facility];  Service: Podiatry;  Laterality: Right;   AMPUTATION Right 09/02/2014   Procedure: PARTIAL AMPUTATION 2ND TOE RIGHT FOOT;  Surgeon: [name], DPM;  Location: [facility];  Service: Podiatry;  Laterality: Right;   AMPUTATION Right 04/01/2015   Procedure: AMPUTATION DIGIT 1ST TOE RIGHT FOOT;  Surgeon: Laurell Josephs, DPM;  Location: [facility];  Service: Podiatry;  Laterality: Right;   AMPUTATION Right 06/29/2016   pinkeye    AMPUTATION Right 10/24/2019   Procedure: PARTIAL THIRD RAY AMPUTATION;  Surgeon: Ferman Hamming, DPM;  Location: [facility];  Service: Podiatry;  Laterality: Right;   AMPUTATION Right 02/19/2020   Procedure: AMPUTATION BELOW KNEE; RIGHT;  Surgeon: [name], MD;  Location: [facility];  Service: General;  Laterality: Right;   FOOT AMPUTATION Left    FOOT SURGERY Left 03/2017   HERNIA REPAIR Bilateral    and 3rd hernia repair does not remember ehat side   IRRIGATION AND DEBRIDEMENT ABSCESS Right 11/05/2019   Procedure: IRRIGATION AND DRAINAGE ABSCESS RIGHT ANKLE;   Surgeon: Ferman Hamming, DPM;  Location: [facility];  Service: Podiatry;  Laterality: Right;   TEE WITHOUT CARDIOVERSION N/A 10/29/2019   Procedure: TRANSESOPHAGEAL ECHOCARDIOGRAM (TEE) WITH PROPOFOL;  Surgeon: Pricilla Riffle, MD;  [facility];  Service: Cardiovascular;  Laterality: N/A;  Social History   Occupational History   Occupation: Receiving  Tobacco Use   Smoking status: Never   Smokeless tobacco: Never  Vaping Use   Vaping Use: Never used  Substance and Sexual Activity   Alcohol use: No   Drug use: No   Sexual activity: Not on file

## 2023-05-02 ENCOUNTER — Ambulatory Visit (INDEPENDENT_AMBULATORY_CARE_PROVIDER_SITE_OTHER): Payer: Medicare Other | Admitting: Family Medicine

## 2023-05-02 ENCOUNTER — Encounter: Payer: Self-pay | Admitting: Family Medicine

## 2023-05-02 VITALS — BP 131/82 | HR 102 | Ht 74.0 in | Wt 202.0 lb

## 2023-05-02 DIAGNOSIS — M51369 Other intervertebral disc degeneration, lumbar region without mention of lumbar back pain or lower extremity pain: Secondary | ICD-10-CM

## 2023-05-02 DIAGNOSIS — Z89512 Acquired absence of left leg below knee: Secondary | ICD-10-CM | POA: Diagnosis not present

## 2023-05-02 DIAGNOSIS — E1142 Type 2 diabetes mellitus with diabetic polyneuropathy: Secondary | ICD-10-CM | POA: Diagnosis not present

## 2023-05-02 DIAGNOSIS — I4811 Longstanding persistent atrial fibrillation: Secondary | ICD-10-CM

## 2023-05-02 DIAGNOSIS — I10 Essential (primary) hypertension: Secondary | ICD-10-CM | POA: Diagnosis not present

## 2023-05-02 DIAGNOSIS — E785 Hyperlipidemia, unspecified: Secondary | ICD-10-CM

## 2023-05-02 DIAGNOSIS — E1122 Type 2 diabetes mellitus with diabetic chronic kidney disease: Secondary | ICD-10-CM

## 2023-05-02 DIAGNOSIS — M5136 Other intervertebral disc degeneration, lumbar region: Secondary | ICD-10-CM

## 2023-05-02 DIAGNOSIS — Z794 Long term (current) use of insulin: Secondary | ICD-10-CM | POA: Diagnosis not present

## 2023-05-02 DIAGNOSIS — Z0289 Encounter for other administrative examinations: Secondary | ICD-10-CM | POA: Diagnosis not present

## 2023-05-02 MED ORDER — HYDROCODONE-ACETAMINOPHEN 10-325 MG PO TABS
1.0000 | ORAL_TABLET | Freq: Two times a day (BID) | ORAL | 0 refills | Status: DC | PRN
Start: 2023-05-31 — End: 2023-09-03

## 2023-05-02 MED ORDER — HYDROCODONE-ACETAMINOPHEN 10-325 MG PO TABS
1.0000 | ORAL_TABLET | Freq: Two times a day (BID) | ORAL | 0 refills | Status: DC | PRN
Start: 2023-05-02 — End: 2023-09-03

## 2023-05-02 MED ORDER — HYDROCODONE-ACETAMINOPHEN 10-325 MG PO TABS
1.0000 | ORAL_TABLET | Freq: Two times a day (BID) | ORAL | 0 refills | Status: DC | PRN
Start: 2023-07-01 — End: 2023-09-03

## 2023-05-02 NOTE — Addendum Note (Signed)
Addended by: Arville Care on: 05/02/2023 12:01 PM   Modules accepted: Orders

## 2023-05-02 NOTE — Progress Notes (Addendum)
BP 131/82   Pulse (!) 102   Ht 6\' 2"  (1.88 m)   Wt 202 lb (91.6 kg)   SpO2 97%   BMI 25.94 kg/m    Subjective:   Patient ID: [name], male    DOB: 08/24/1966, 57 y.o.   MRN: [id number]  HPI: [name] is a 57 y.o. male presenting on 05/02/2023 for Medical Management of Chronic Issues, Diabetes, and Hypertension   HPI Type 2 diabetes mellitus Patient comes in today for recheck of his diabetes. Patient has been currently taking metformin and NovoLog and [place] and Ozempic. Patient is not currently on an ACE inhibitor/ARB. Patient has not seen an ophthalmologist this year. 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  On his left leg he has a little sore on the left but says it was infected and was treated in the emergency department and it has been getting better.  He he was having some purulent drainage before but no longer.  The symptom started onset as an adult neuropathy and afib ARE RELATED TO DM   Hyperlipidemia Patient is coming in for recheck of his hyperlipidemia. The patient is currently taking atorvastatin. They deny any issues with myalgias or history of liver damage from it. They deny any focal numbness or weakness or chest pain.   Hypertension and Afib Patient is currently on Eliquis and metoprolol, and their blood pressure today is 131/82. Patient denies any lightheadedness or dizziness. Patient denies headaches, blurred vision, chest pains, shortness of breath, or weakness. Denies any side effects from medication and is content with current medication.   Pain assessment: Cause of pain-disc disease lumbar Pain location-lower back Pain on scale of 1-10- 5 Frequency-daily What increases pain-being on feet What makes pain Better-medication helps Effects on ADL -minimal Any change in general medical condition-blood sugar is not doing as well  Current opioids rx-hydrocodone 10-3 25 twice daily as needed he stated # meds  rx-60 Effectiveness of current meds-works well Adverse reactions from pain meds-none Morphine equivalent- 20  Pill count performed-No Last drug screen - 02/09/23 ( high risk q69m, moderate risk q91m, low risk yearly ) Urine drug screen today- No Was the [facility] reviewed- yes  If yes were their any concerning findings? - none  Pain contract signed on: 02/09/23  Relevant past medical, surgical, family and social history reviewed and updated as indicated. Interim medical history since our last visit reviewed. Allergies and medications reviewed and updated.  Review of Systems  Constitutional:  Negative for chills and fever.  Eyes:  Negative for visual disturbance.  Respiratory:  Negative for shortness of breath and wheezing.   Cardiovascular:  Negative for chest pain and leg swelling.  Musculoskeletal:  Positive for arthralgias and back pain. Negative for gait problem.  Skin:  Positive for wound. Negative for rash.  Neurological:  Negative for dizziness and light-headedness.  All other systems reviewed and are negative.   Per HPI unless specifically indicated above   Allergies as of 05/02/2023   No Known Allergies      Medication List        Accurate as of May 02, 2023 12:01 PM. If you have any questions, ask your nurse or doctor.          apixaban 5 MG Tabs tablet Commonly known as: ELIQUIS Take 1 tablet (5 mg total) by mouth 2 (two) times daily.   atorvastatin 20 MG tablet Commonly known as: LIPITOR TAKE 1 TABLET BY  MOUTH EVERY DAY   fluticasone 50 MCG/ACT nasal spray Commonly known as: FLONASE Place 1 spray into both nostrils 2 (two) times daily as needed for allergies or rhinitis.   FreeStyle [place] 2 Reader [place] Use as directed to test blood sugar up to 6 times daily as directed. DX: E 11.9   FreeStyle Libre 2 Sensor Misc Use as directed to test blood sugar up to 6 times daily as directed. DX: E 11.9   gabapentin 600 MG tablet Commonly known as:  NEURONTIN Take 1 tablet (600 mg total) by mouth 3 (three) times daily. What changed: when to take this   HYDROcodone-acetaminophen 10-325 MG tablet Commonly known as: NORCO Take 1 tablet by mouth 2 (two) times daily as needed. What changed: Another medication with the same name was added. Make sure you understand how and when to take each. Changed by: Nils Pyle, MD   HYDROcodone-acetaminophen 10-325 MG tablet Commonly known as: NORCO Take 1 tablet by mouth 2 (two) times daily as needed. Start taking on: May 31, 2023 What changed: You were already taking a medication with the same name, and this prescription was added. Make sure you understand how and when to take each. Changed by: Nils Pyle, MD   HYDROcodone-acetaminophen 10-325 MG tablet Commonly known as: NORCO Take 1 tablet by mouth 2 (two) times daily as needed. Start taking on: July 01, 2023 What changed: You were already taking a medication with the same name, and this prescription was added. Make sure you understand how and when to take each. Changed by: Elige Radon Shacarra Choe, MD   metFORMIN 1000 MG tablet Commonly known as: GLUCOPHAGE Take 1 tablet (1,000 mg total) by mouth 2 (two) times daily.   metoprolol succinate 25 MG 24 hr tablet Commonly known as: TOPROL-XL TAKE 1 TABLET BY MOUTH EVERY DAY   NovoLOG FlexPen 100 UNIT/ML FlexPen Generic drug: insulin aspart Inject 5-10 Units into the skin 3 (three) times daily with meals. What changed: how much to take   Semaglutide (2 MG/DOSE) 8 MG/3ML Sopn Inject 2 mg as directed once a week.   Evaristo Bury FlexTouch 100 UNIT/ML FlexTouch Pen Generic drug: insulin degludec Inject 72-76 Units into the skin daily.         Objective:   BP 131/82   Pulse (!) 102   Ht 6\' 2"  (1.88 m)   Wt 202 lb (91.6 kg)   SpO2 97%   BMI 25.94 kg/m   Wt Readings from Last 3 Encounters:  05/02/23 202 lb (91.6 kg)  03/24/23 198 lb 3.1 oz (89.9 kg)  02/05/23 212 lb  (96.2 kg)    Physical Exam Vitals and nursing note reviewed.  Constitutional:      General: He is not in acute distress.    Appearance: He is well-developed. He is not diaphoretic.  Eyes:     General: No scleral icterus.    Conjunctiva/sclera: Conjunctivae normal.  Neck:     Thyroid: No thyromegaly.  Cardiovascular:     Rate and Rhythm: Normal rate and regular rhythm.     Heart sounds: Normal heart sounds. No murmur heard. Pulmonary:     Effort: Pulmonary effort is normal. No respiratory distress.     Breath sounds: Normal breath sounds. No wheezing.  Musculoskeletal:        General: No swelling. Normal range of motion.     Cervical back: Neck supple.  Lymphadenopathy:     Cervical: No cervical adenopathy.  Skin:    General:  Skin is warm and dry.     Findings: Lesion (Small 0.2 cm healing lesion on the end of his left leg on the stump.) present. No erythema or rash.  Neurological:     Mental Status: He is alert and oriented to person, place, and time.     Coordination: Coordination normal.  Psychiatric:        Behavior: Behavior normal.     Results for orders placed or performed during the hospital encounter of 03/22/23  Blood culture (routine x 2)   Specimen: BLOOD  Result Value Ref Range   Specimen Description BLOOD LEFT ANTECUBITAL    Special Requests      BOTTLES DRAWN AEROBIC AND ANAEROBIC Blood Culture results may not be optimal due to an excessive volume of blood received in culture bottles   Culture      NO GROWTH 5 DAYS Performed at [facility], [address] [place], [place] [address]    Report Status 03/28/2023 FINAL   Blood culture (routine x 2)   Specimen: BLOOD LEFT HAND  Result Value Ref Range   Specimen Description BLOOD LEFT HAND    Special Requests      BOTTLES DRAWN AEROBIC AND ANAEROBIC Blood Culture adequate volume   Culture      NO GROWTH 5 DAYS Performed at [facility], [address] [place], [place] [address]    Report  Status 03/28/2023 FINAL   MRSA Next Gen by PCR, Nasal   Specimen: Nasal Mucosa; Nasal Swab  Result Value Ref Range   MRSA by PCR Next Gen NOT DETECTED NOT DETECTED  CBC with Differential  Result Value Ref Range   WBC 8.8 4.0 - 10.5 K/uL   RBC 4.90 4.22 - 5.81 MIL/uL   Hemoglobin 15.2 13.0 - 17.0 g/dL   HCT 62.9 52.8 - 41.3 %   MCV 90.4 80.0 - 100.0 fL   MCH 31.0 26.0 - 34.0 pg   MCHC 34.3 30.0 - 36.0 g/dL   RDW 24.4 01.0 - 27.2 %   Platelets 214 150 - 400 K/uL   nRBC 0.0 0.0 - 0.2 %   Neutrophils Relative % 78 %   Neutro Abs 7.0 1.7 - 7.7 K/uL   Lymphocytes Relative 12 %   Lymphs Abs 1.1 0.7 - 4.0 K/uL   Monocytes Relative 8 %   Monocytes Absolute 0.7 0.1 - 1.0 K/uL   Eosinophils Relative 0 %   Eosinophils Absolute 0.0 0.0 - 0.5 K/uL   Basophils Relative 1 %   Basophils Absolute 0.0 0.0 - 0.1 K/uL   Immature Granulocytes 1 %   Abs Immature Granulocytes 0.04 0.00 - 0.07 K/uL  Basic metabolic panel  Result Value Ref Range   Sodium 131 (L) 135 - 145 mmol/L   Potassium 4.6 3.5 - 5.1 mmol/L   Chloride 94 (L) 98 - 111 mmol/L   CO2 27 22 - 32 mmol/L   Glucose, Bld 366 (H) 70 - 99 mg/dL   BUN 18 6 - 20 mg/dL   Creatinine, Ser 5.36 0.61 - 1.24 mg/dL   Calcium 8.7 (L) 8.9 - 10.3 mg/dL   GFR, Estimated >64 >40 mL/min   Anion gap 10 5 - 15  Hemoglobin A1c  Result Value Ref Range   Hgb A1c MFr Bld 7.9 (H) 4.8 - 5.6 %   Mean Plasma Glucose 180.03 mg/dL  CBC  Result Value Ref Range   WBC 7.5 4.0 - 10.5 K/uL   RBC 4.65  4.22 - 5.81 MIL/uL   Hemoglobin 14.4 13.0 - 17.0 g/dL   HCT 95.6 21.3 - 08.6 %   MCV 89.5 80.0 - 100.0 fL   MCH 31.0 26.0 - 34.0 pg   MCHC 34.6 30.0 - 36.0 g/dL   RDW 57.8 46.9 - 62.9 %   Platelets 190 150 - 400 K/uL   nRBC 0.0 0.0 - 0.2 %  Basic metabolic panel  Result Value Ref Range   Sodium 133 (L) 135 - 145 mmol/L   Potassium 4.2 3.5 - 5.1 mmol/L   Chloride 98 98 - 111 mmol/L   CO2 26 22 - 32 mmol/L   Glucose, Bld 118 (H) 70 - 99 mg/dL   BUN 16 6 -  20 mg/dL   Creatinine, Ser 5.28 0.61 - 1.24 mg/dL   Calcium 8.6 (L) 8.9 - 10.3 mg/dL   GFR, Estimated >41 >32 mL/min   Anion gap 9 5 - 15  Magnesium  Result Value Ref Range   Magnesium 1.5 (L) 1.7 - 2.4 mg/dL  Phosphorus  Result Value Ref Range   Phosphorus 3.2 2.5 - 4.6 mg/dL  HIV Antibody (routine testing w rflx)  Result Value Ref Range   HIV Screen 4th Generation wRfx Non Reactive Non Reactive  Glucose, capillary  Result Value Ref Range   Glucose-Capillary 113 (H) 70 - 99 mg/dL  Glucose, capillary  Result Value Ref Range   Glucose-Capillary 106 (H) 70 - 99 mg/dL  Glucose, capillary  Result Value Ref Range   Glucose-Capillary 91 70 - 99 mg/dL  Glucose, capillary  Result Value Ref Range   Glucose-Capillary 201 (H) 70 - 99 mg/dL  Glucose, capillary  Result Value Ref Range   Glucose-Capillary 179 (H) 70 - 99 mg/dL  CBG monitoring, ED  Result Value Ref Range   Glucose-Capillary 146 (H) 70 - 99 mg/dL    Assessment & Plan:   Problem List Items Addressed This Visit       Cardiovascular and Mediastinum   Essential hypertension, benign   A-fib (HCC)     Endocrine   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   Type 2 diabetes mellitus (HCC)     Musculoskeletal and Integument   DDD (degenerative disc disease), lumbar   Relevant Medications   HYDROcodone-acetaminophen (NORCO) 10-325 MG tablet   HYDROcodone-acetaminophen (NORCO) 10-325 MG tablet (Start on 05/31/2023)   HYDROcodone-acetaminophen (NORCO) 10-325 MG tablet (Start on 07/01/2023)     Other   Dyslipidemia   Pain management contract signed   Relevant Medications   HYDROcodone-acetaminophen (NORCO) 10-325 MG tablet   HYDROcodone-acetaminophen (NORCO) 10-325 MG tablet (Start on 05/31/2023)   HYDROcodone-acetaminophen (NORCO) 10-325 MG tablet (Start on 07/01/2023)   S/P BKA (below knee amputation) unilateral, left (HCC)   Relevant Medications   HYDROcodone-acetaminophen (NORCO) 10-325 MG tablet    HYDROcodone-acetaminophen (NORCO) 10-325 MG tablet (Start on 05/31/2023)   HYDROcodone-acetaminophen (NORCO) 10-325 MG tablet (Start on 07/01/2023)    A1c was up in the hospital at 7.9.  Sounds like with the infection it was elevated.  He is going to refocus on diet and be more consistent about his mealtime insulins, he does admit that he misses them sometimes. Follow up plan: Return in about 3 months (around 08/02/2023), or if symptoms worsen or fail to improve, for Diabetes and hypertension and A-fib and cholesterol.  Counseling provided for all of the vaccine components No orders of the defined types were placed in this encounter.   Arville Care, MD [facility] 05/02/2023, 12:01 PM

## 2023-05-18 ENCOUNTER — Telehealth: Payer: Self-pay | Admitting: Family Medicine

## 2023-05-18 NOTE — Telephone Encounter (Signed)
Patient calling because he is running out of [place] and said that Raynelle Fanning usually helps him get it. Please call back and advise.

## 2023-05-23 ENCOUNTER — Telehealth: Payer: Self-pay | Admitting: Family Medicine

## 2023-05-23 NOTE — Telephone Encounter (Signed)
Patient is aware and wanted to make sure the medication was being sent to our office.

## 2023-05-23 NOTE — Telephone Encounter (Signed)
No answer and vmail is full. 

## 2023-05-23 NOTE — Telephone Encounter (Signed)
Patient needs samples of Eliquis. Please call back to let him know if we have any.

## 2023-05-23 NOTE — Telephone Encounter (Signed)
He calls every month for it (does not meet requirements for patient assitance) He can ask cardiology for sampls and I'm pretty sure we are out

## 2023-05-24 ENCOUNTER — Other Ambulatory Visit: Payer: Self-pay | Admitting: Family Medicine

## 2023-05-24 DIAGNOSIS — E1142 Type 2 diabetes mellitus with diabetic polyneuropathy: Secondary | ICD-10-CM

## 2023-05-24 DIAGNOSIS — E1140 Type 2 diabetes mellitus with diabetic neuropathy, unspecified: Secondary | ICD-10-CM

## 2023-05-24 NOTE — Telephone Encounter (Signed)
Emailed [place] refills to [facility].  Will fax to novo nordisk urgent fax line [phone] (attn; pt. ID [id number])

## 2023-05-29 ENCOUNTER — Telehealth: Payer: Self-pay | Admitting: Family Medicine

## 2023-05-29 NOTE — Telephone Encounter (Signed)
Patient aware and verbalized understanding. No samples.

## 2023-06-12 ENCOUNTER — Telehealth: Payer: Self-pay | Admitting: Family Medicine

## 2023-06-12 DIAGNOSIS — Z794 Long term (current) use of insulin: Secondary | ICD-10-CM

## 2023-06-12 MED ORDER — TRESIBA FLEXTOUCH 200 UNIT/ML ~~LOC~~ SOPN
76.0000 [IU] | PEN_INJECTOR | Freq: Every day | SUBCUTANEOUS | 5 refills | Status: DC
Start: 2023-06-12 — End: 2024-01-28

## 2023-06-12 NOTE — Telephone Encounter (Signed)
Informed pt that we do not have samples of Eliquis or [place].  Pt has enough Eliquis for one week and is out of the [place].  Pt states that Raynelle Fanning was helping him with [place]. Could not see the correct pharmacy to send it to.

## 2023-06-12 NOTE — Telephone Encounter (Signed)
Please let patient know [name] RX sent to [facility] pharmacy We have not received his tresiba yet & are out of samples of insulin & eliquis I will send to [facility] as well to track shipment, etc  Durward Mallard, can you f/u up patient assistance for tresiba/ozempic?

## 2023-06-12 NOTE — Telephone Encounter (Signed)
Patient said he is unable to pay for the medication and wants to know what is delaying him getting it through the patient assistance program. Please call back and advise.

## 2023-06-13 NOTE — Telephone Encounter (Signed)
No tresiba samples available  1 toujeo pen sample left with Toniann Fail, RN Discussed same dosing in units as Tresiba CPhT to f/u novo nordisk patient assistance Patient given phone number to call to check on PAP [phone] press 4

## 2023-06-19 ENCOUNTER — Other Ambulatory Visit: Payer: Self-pay | Admitting: Family Medicine

## 2023-06-19 DIAGNOSIS — E1142 Type 2 diabetes mellitus with diabetic polyneuropathy: Secondary | ICD-10-CM

## 2023-06-19 DIAGNOSIS — E1140 Type 2 diabetes mellitus with diabetic neuropathy, unspecified: Secondary | ICD-10-CM

## 2023-06-19 DIAGNOSIS — E785 Hyperlipidemia, unspecified: Secondary | ICD-10-CM

## 2023-06-19 NOTE — Telephone Encounter (Signed)
Pt called stating that [employer] is still waiting to receive his paperwork for the [place]. Please advise and call pt with an update.

## 2023-06-19 NOTE — Telephone Encounter (Signed)
Pt says he was unaware that Dr Louanne Skye took him off the Duloxetine. Says he is still taking and has been this whole time. Wants to know why Dr Dettinger took him off of the medicine?  Pt aware that Dr Louanne Skye is on vacation and wont get this message until next week when he comes back.

## 2023-06-21 NOTE — Telephone Encounter (Signed)
Reached out to pt via mychart to inform him that ozempic and tresiba refills were faxed 06/15/23. Also provided company phone number to f/u on shipment.

## 2023-07-05 ENCOUNTER — Telehealth: Payer: Self-pay | Admitting: Family Medicine

## 2023-07-05 NOTE — Telephone Encounter (Signed)
Called patient to collect for handicap form and he is going to come by and pay for it. Form is on LAT desk in basket.

## 2023-07-11 NOTE — Telephone Encounter (Signed)
Pt paid for handicap form. Will put in [place] box.

## 2023-07-11 NOTE — Telephone Encounter (Signed)
Pt called to see if we have received his shipment yet? Says he called patient assistance program and was told that they have already shipped his orders.   Pt also wants to know if we have samples of Eliquis?

## 2023-07-11 NOTE — Telephone Encounter (Signed)
Pt picked up order and Dr Dettinger gave him 2 sample boxes of Eliquis 5mg .

## 2023-07-16 NOTE — Telephone Encounter (Signed)
Pt made aware that form is complete. He will come by the office and pick up today.

## 2023-07-18 ENCOUNTER — Other Ambulatory Visit: Payer: Self-pay | Admitting: Family Medicine

## 2023-07-18 DIAGNOSIS — E1142 Type 2 diabetes mellitus with diabetic polyneuropathy: Secondary | ICD-10-CM

## 2023-07-18 NOTE — Telephone Encounter (Signed)
Pt wants to know if we have any samples of Eliquis that we can give him?

## 2023-07-23 ENCOUNTER — Ambulatory Visit: Payer: Medicare Other

## 2023-07-24 ENCOUNTER — Ambulatory Visit (INDEPENDENT_AMBULATORY_CARE_PROVIDER_SITE_OTHER): Payer: Medicare Other

## 2023-07-24 DIAGNOSIS — E1122 Type 2 diabetes mellitus with diabetic chronic kidney disease: Secondary | ICD-10-CM

## 2023-07-24 DIAGNOSIS — Z794 Long term (current) use of insulin: Secondary | ICD-10-CM

## 2023-07-24 DIAGNOSIS — Z23 Encounter for immunization: Secondary | ICD-10-CM | POA: Diagnosis not present

## 2023-07-24 LAB — HM DIABETES EYE EXAM

## 2023-07-24 NOTE — Progress Notes (Signed)
[name] arrived 07/24/2023 and has given verbal consent to obtain images and complete their overdue diabetic retinal screening.  The images have been sent to an ophthalmologist or optometrist for review and interpretation.  Results will be sent back to Dettinger, Elige Radon, MD for review.  Patient has been informed they will be contacted when we receive the results via telephone or MyChart

## 2023-08-03 ENCOUNTER — Ambulatory Visit: Payer: Medicare Other | Admitting: Family Medicine

## 2023-08-23 ENCOUNTER — Telehealth: Payer: Self-pay | Admitting: Family Medicine

## 2023-08-23 ENCOUNTER — Other Ambulatory Visit: Payer: Self-pay | Admitting: Family Medicine

## 2023-08-23 DIAGNOSIS — E1142 Type 2 diabetes mellitus with diabetic polyneuropathy: Secondary | ICD-10-CM

## 2023-08-23 NOTE — Telephone Encounter (Signed)
Pt wants a refill on HYDROcodone-acetaminophen (NORCO) 10-325 MG tablet before his apt next week with Dr Louanne Skye. I explained to him that the refill cannot be called in outside if office visit and he insisted that I send a message because the dr has sent in refill in the past. I explained that due to pain contract he cannot get the refill. Please call back

## 2023-08-23 NOTE — Telephone Encounter (Signed)
Yes he cannot get refills outside of visits, I do not know why his visit is late but in the future we need to make sure it is on schedule

## 2023-08-23 NOTE — Telephone Encounter (Signed)
Patients appt for 9/27 was cancelled by [place] because of weather and rescheduled for next week. This time it was not the patients fault. Do you want to refill or bring patient in sooner?

## 2023-08-24 MED ORDER — HYDROCODONE-ACETAMINOPHEN 10-325 MG PO TABS: 1.0000 | ORAL_TABLET | Freq: Two times a day (BID) | ORAL | 0 refills | Status: DC | PRN

## 2023-08-24 NOTE — Telephone Encounter (Signed)
Sent enough for him to get through to the appointment

## 2023-08-24 NOTE — Telephone Encounter (Signed)
Patient aware and verbalized understanding. °

## 2023-08-28 ENCOUNTER — Encounter: Payer: Self-pay | Admitting: Pharmacist

## 2023-09-03 ENCOUNTER — Encounter: Payer: Self-pay | Admitting: Family Medicine

## 2023-09-03 ENCOUNTER — Ambulatory Visit (INDEPENDENT_AMBULATORY_CARE_PROVIDER_SITE_OTHER): Payer: Medicare Other | Admitting: Family Medicine

## 2023-09-03 VITALS — BP 133/71 | HR 93 | Ht 74.0 in | Wt 199.0 lb

## 2023-09-03 DIAGNOSIS — E785 Hyperlipidemia, unspecified: Secondary | ICD-10-CM

## 2023-09-03 DIAGNOSIS — E1142 Type 2 diabetes mellitus with diabetic polyneuropathy: Secondary | ICD-10-CM | POA: Diagnosis not present

## 2023-09-03 DIAGNOSIS — Z89512 Acquired absence of left leg below knee: Secondary | ICD-10-CM

## 2023-09-03 DIAGNOSIS — I10 Essential (primary) hypertension: Secondary | ICD-10-CM

## 2023-09-03 DIAGNOSIS — Z0289 Encounter for other administrative examinations: Secondary | ICD-10-CM

## 2023-09-03 DIAGNOSIS — Z794 Long term (current) use of insulin: Secondary | ICD-10-CM | POA: Diagnosis not present

## 2023-09-03 DIAGNOSIS — E1140 Type 2 diabetes mellitus with diabetic neuropathy, unspecified: Secondary | ICD-10-CM

## 2023-09-03 DIAGNOSIS — M51369 Other intervertebral disc degeneration, lumbar region without mention of lumbar back pain or lower extremity pain: Secondary | ICD-10-CM | POA: Diagnosis not present

## 2023-09-03 DIAGNOSIS — E1122 Type 2 diabetes mellitus with diabetic chronic kidney disease: Secondary | ICD-10-CM | POA: Diagnosis not present

## 2023-09-03 DIAGNOSIS — K047 Periapical abscess without sinus: Secondary | ICD-10-CM

## 2023-09-03 LAB — BAYER DCA HB A1C WAIVED: HB A1C (BAYER DCA - WAIVED): 6.6 % — ABNORMAL HIGH (ref 4.8–5.6)

## 2023-09-03 MED ORDER — METFORMIN HCL 1000 MG PO TABS
1000.0000 mg | ORAL_TABLET | Freq: Two times a day (BID) | ORAL | 0 refills | Status: DC
Start: 2023-09-03 — End: 2024-01-28

## 2023-09-03 MED ORDER — HYDROCODONE-ACETAMINOPHEN 10-325 MG PO TABS
1.0000 | ORAL_TABLET | Freq: Two times a day (BID) | ORAL | 0 refills | Status: DC | PRN
Start: 2023-09-03 — End: 2024-01-28

## 2023-09-03 MED ORDER — ATORVASTATIN CALCIUM 20 MG PO TABS: 20.0000 mg | ORAL_TABLET | Freq: Every day | ORAL | 3 refills | Status: DC

## 2023-09-03 MED ORDER — AMOXICILLIN-POT CLAVULANATE 875-125 MG PO TABS
1.0000 | ORAL_TABLET | Freq: Two times a day (BID) | ORAL | 0 refills | Status: DC
Start: 2023-09-03 — End: 2024-01-28

## 2023-09-03 MED ORDER — DULOXETINE HCL 60 MG PO CPEP: 60.0000 mg | ORAL_CAPSULE | Freq: Every evening | ORAL | 3 refills | Status: DC | PRN

## 2023-09-03 MED ORDER — HYDROCODONE-ACETAMINOPHEN 10-325 MG PO TABS
1.0000 | ORAL_TABLET | Freq: Two times a day (BID) | ORAL | 0 refills | Status: DC | PRN
Start: 2023-10-03 — End: 2024-01-28

## 2023-09-03 MED ORDER — HYDROCODONE-ACETAMINOPHEN 10-325 MG PO TABS
1.0000 | ORAL_TABLET | Freq: Two times a day (BID) | ORAL | 0 refills | Status: DC | PRN
Start: 2023-11-02 — End: 2024-01-28

## 2023-09-03 MED ORDER — GABAPENTIN 600 MG PO TABS: 600.0000 mg | ORAL_TABLET | Freq: Three times a day (TID) | ORAL | 3 refills | Status: DC

## 2023-09-03 NOTE — Progress Notes (Unsigned)
BP 133/71   Pulse 93   Ht 6\' 2"  (1.88 m)   Wt 199 lb (90.3 kg)   SpO2 99%   BMI 25.55 kg/m    Subjective:   Patient ID: [name], male    DOB: August 17, 1966, 57 y.o.   MRN: [id number]  HPI: [name] is a 57 y.o. male presenting on 09/03/2023 for Medical Management of Chronic Issues, Diabetes, and Hypertension  Type 2 diabetes mellitus Patient is currently taking Ozempic, Tresiba, Novolog, metformin, and gabapentin. His blood glucose ranges between 110-220s at home. He has bilateral BKAs with prosthesis. He has not seen an ophthalmologist this year. His diabetes is complicated by diabetic neuropathy. He denies any hypoglycemic episodes.  Hypertension/Atrial fibrillation Patient is currently taking metoprolol and apixaban. His blood pressure and heart rate are 133/81 and 93 today, respectively. He denies lightheadedness or dizziness, chest pain, shortness of breath, headaches, LE edema, blurred vision, and significant bleeding or bruising. He does have palpitations if he does not take the metoprolol.  Hyperlipidemia Patient is currently taking atorvastatin. He denies myalgias or weakness. He does not have a history of liver damage from it.  Patient also reports tooth pain that started about 10 days ago. The pain is localized to the right upper jaw and radiates to the right temporal area. He describes the pain as throbbing and rates the pain as an 11/10. He has tried taking gabapentin, hydrocodone, ibuprofen, and orogel for the pain, but nothing relieves the pain for more than a few minutes. He has trouble eating due to the pain. He denies fevers, chills, sinus pressure, or congestion. He was seen by a dentist for a routine cleaning around the same time the pain started, and x-rays were negative. He was referred to an oral surgeon today.  Relevant past medical, surgical, family and social history reviewed and updated as indicated. Interim medical history since our last visit  reviewed. Allergies and medications reviewed and updated.  Review of Systems  Constitutional:  Negative for chills and fever.  HENT:  Positive for dental problem. Negative for congestion, sinus pressure and sinus pain.   Eyes:  Negative for visual disturbance.  Respiratory:  Negative for chest tightness and shortness of breath.   Cardiovascular:  Negative for chest pain and palpitations.  Gastrointestinal:  Positive for vomiting. Negative for blood in stool.       Vomiting from tooth pain  Genitourinary:  Negative for hematuria.  Musculoskeletal:  Positive for myalgias.       Pulled hamstring  Neurological:  Positive for headaches. Negative for dizziness, syncope, weakness and light-headedness.       Headache from tooth pain  Hematological:  Does not bruise/bleed easily.    Per HPI unless specifically indicated above   Allergies as of 09/03/2023   No Known Allergies      Medication List        Accurate as of September 03, 2023  3:56 PM. If you have any questions, ask your nurse or doctor.          amoxicillin-clavulanate 875-125 MG tablet Commonly known as: AUGMENTIN Take 1 tablet by mouth 2 (two) times daily. Started by: Elige Radon Dettinger   apixaban 5 MG Tabs tablet Commonly known as: ELIQUIS Take 1 tablet (5 mg total) by mouth 2 (two) times daily.   atorvastatin 20 MG tablet Commonly known as: LIPITOR Take 1 tablet (20 mg total) by mouth daily.   DULoxetine 60 MG capsule Commonly known as:  CYMBALTA Take 1 capsule (60 mg total) by mouth at bedtime and may repeat dose one time if needed.   fluticasone 50 MCG/ACT nasal spray Commonly known as: FLONASE Place 1 spray into both nostrils 2 (two) times daily as needed for allergies or rhinitis.   FreeStyle [place] 2 Reader [place] Use as directed to test blood sugar up to 6 times daily as directed. DX: E 11.9   FreeStyle Libre 2 Sensor Misc Use as directed to test blood sugar up to 6 times daily as directed. DX: E  11.9   gabapentin 600 MG tablet Commonly known as: NEURONTIN Take 1 tablet (600 mg total) by mouth 3 (three) times daily.   HYDROcodone-acetaminophen 10-325 MG tablet Commonly known as: NORCO Take 1 tablet by mouth 2 (two) times daily as needed. What changed:  Another medication with the same name was changed. Make sure you understand how and when to take each. Another medication with the same name was removed. Continue taking this medication, and follow the directions you see here. Changed by: Elige Radon Dettinger   HYDROcodone-acetaminophen 10-325 MG tablet Commonly known as: NORCO Take 1 tablet by mouth 2 (two) times daily as needed. Start taking on: October 03, 2023 What changed:  These instructions start on October 03, 2023. If you are unsure what to do until then, ask your doctor or other care provider. Another medication with the same name was removed. Continue taking this medication, and follow the directions you see here. Changed by: Elige Radon Dettinger   HYDROcodone-acetaminophen 10-325 MG tablet Commonly known as: NORCO Take 1 tablet by mouth 2 (two) times daily as needed. Start taking on: November 02, 2023 What changed:  These instructions start on November 02, 2023. If you are unsure what to do until then, ask your doctor or other care provider. Another medication with the same name was removed. Continue taking this medication, and follow the directions you see here. Changed by: Elige Radon Dettinger   metFORMIN 1000 MG tablet Commonly known as: GLUCOPHAGE Take 1 tablet (1,000 mg total) by mouth 2 (two) times daily.   metoprolol succinate 25 MG 24 hr tablet Commonly known as: TOPROL-XL TAKE 1 TABLET BY MOUTH EVERY DAY   NovoLOG FlexPen 100 UNIT/ML FlexPen Generic drug: insulin aspart Inject 5-10 Units into the skin 3 (three) times daily with meals. What changed: how much to take   Semaglutide (2 MG/DOSE) 8 MG/3ML Sopn Inject 2 mg as directed once a week.    Evaristo Bury FlexTouch 200 UNIT/ML FlexTouch Pen Generic drug: insulin degludec Inject 76 Units into the skin daily.         Objective:   BP 133/71   Pulse 93   Ht 6\' 2"  (1.88 m)   Wt 199 lb (90.3 kg)   SpO2 99%   BMI 25.55 kg/m   Wt Readings from Last 3 Encounters:  09/03/23 199 lb (90.3 kg)  05/02/23 202 lb (91.6 kg)  03/24/23 198 lb 3.1 oz (89.9 kg)    Physical Exam Vitals and nursing note reviewed.  Constitutional:      General: He is not in acute distress.    Appearance: Normal appearance.  HENT:     Head: Normocephalic and atraumatic.     Right Ear: Tympanic membrane, ear canal and external ear normal.     Left Ear: Tympanic membrane, ear canal and external ear normal.     Nose: No congestion.     Mouth/Throat:     Mouth: Mucous membranes are  moist.     Comments: Swelling of right cheek Eyes:     Conjunctiva/sclera: Conjunctivae normal.  Cardiovascular:     Rate and Rhythm: Normal rate and regular rhythm.     Heart sounds: Normal heart sounds.  Pulmonary:     Effort: Pulmonary effort is normal. No respiratory distress.     Breath sounds: Normal breath sounds.  Musculoskeletal:        General: Tenderness present. No swelling. Normal range of motion.     Cervical back: Normal range of motion and neck supple.     Comments: Bilateral BKAs. Right lateral hamstring tender to palpation. ROM normal.  Skin:    General: Skin is warm and dry.  Neurological:     Mental Status: He is alert and oriented to person, place, and time.  Psychiatric:        Mood and Affect: Mood normal.        Behavior: Behavior normal.        Thought Content: Thought content normal.        Judgment: Judgment normal.    Results for orders placed or performed in visit on 08/03/23  HM DIABETES EYE EXAM  Result Value Ref Range   HM Diabetic Eye Exam No Retinopathy No Retinopathy    Assessment & Plan:   Problem List Items Addressed This Visit       Cardiovascular and Mediastinum    Essential hypertension, benign   Relevant Medications   atorvastatin (LIPITOR) 20 MG tablet     Endocrine   Neuropathy in diabetes (HCC)   Relevant Medications   DULoxetine (CYMBALTA) 60 MG capsule   metFORMIN (GLUCOPHAGE) 1000 MG tablet   gabapentin (NEURONTIN) 600 MG tablet   atorvastatin (LIPITOR) 20 MG tablet   Type 2 diabetes mellitus (HCC) - Primary   Relevant Medications   metFORMIN (GLUCOPHAGE) 1000 MG tablet   atorvastatin (LIPITOR) 20 MG tablet   Other Relevant Orders   CBC with Differential/Platelet   CMP14+EGFR   Lipid panel   Bayer DCA Hb A1c Waived     Musculoskeletal and Integument   DDD (degenerative disc disease), lumbar   Relevant Medications   HYDROcodone-acetaminophen (NORCO) 10-325 MG tablet (Start on 11/02/2023)   HYDROcodone-acetaminophen (NORCO) 10-325 MG tablet (Start on 10/03/2023)   HYDROcodone-acetaminophen (NORCO) 10-325 MG tablet     Other   Pain management contract signed   Relevant Medications   HYDROcodone-acetaminophen (NORCO) 10-325 MG tablet (Start on 11/02/2023)   HYDROcodone-acetaminophen (NORCO) 10-325 MG tablet (Start on 10/03/2023)   HYDROcodone-acetaminophen (NORCO) 10-325 MG tablet   S/P BKA (below knee amputation) unilateral, left (HCC)   Relevant Medications   HYDROcodone-acetaminophen (NORCO) 10-325 MG tablet (Start on 11/02/2023)   HYDROcodone-acetaminophen (NORCO) 10-325 MG tablet (Start on 10/03/2023)   HYDROcodone-acetaminophen (NORCO) 10-325 MG tablet   Dyslipidemia   Relevant Medications   atorvastatin (LIPITOR) 20 MG tablet   Other Visit Diagnoses     Dental infection       Relevant Medications   amoxicillin-clavulanate (AUGMENTIN) 875-125 MG tablet       Blood pressure controlled at 133/81. Heart rate normal and in regular rhythm today. Will continue current dose of metoprolol and Eliquis. HgbA1c improved at 6.5%. Encouraged patient to continue medications and diet changes. Patient is tolerating atorvastatin  well. Will recheck lipid panel today. Prescribed Augmentin for dental infection. Discussed need to schedule appointment with oral surgeon soon.  Follow up plan: Return in about 3 months (around 12/04/2023), or if symptoms worsen or  fail to improve, for Diabetes and pain management.  Counseling provided for all of the vaccine components Orders Placed This Encounter  Procedures   CBC with Differential/Platelet   CMP14+EGFR   Lipid panel   Bayer DCA Hb A1c Waived   Gillermina Phy, Medical Student [facility] 09/03/2023, 3:56 PM

## 2023-09-04 LAB — CBC WITH DIFFERENTIAL/PLATELET
Basophils Absolute: 0.1 10*3/uL (ref 0.0–0.2)
Basos: 1 %
EOS (ABSOLUTE): 0 10*3/uL (ref 0.0–0.4)
Eos: 1 %
Hematocrit: 45.6 % (ref 37.5–51.0)
Hemoglobin: 15.4 g/dL (ref 13.0–17.7)
Immature Grans (Abs): 0 10*3/uL (ref 0.0–0.1)
Immature Granulocytes: 0 %
Lymphocytes Absolute: 1.6 10*3/uL (ref 0.7–3.1)
Lymphs: 26 %
MCH: 30.9 pg (ref 26.6–33.0)
MCHC: 33.8 g/dL (ref 31.5–35.7)
MCV: 92 fL (ref 79–97)
Monocytes Absolute: 0.4 10*3/uL (ref 0.1–0.9)
Monocytes: 7 %
Neutrophils Absolute: 4 10*3/uL (ref 1.4–7.0)
Neutrophils: 65 %
Platelets: 209 10*3/uL (ref 150–450)
RBC: 4.98 x10E6/uL (ref 4.14–5.80)
RDW: 12.1 % (ref 11.6–15.4)
WBC: 6.1 10*3/uL (ref 3.4–10.8)

## 2023-09-04 LAB — CMP14+EGFR
ALT: 28 [IU]/L (ref 0–44)
AST: 21 [IU]/L (ref 0–40)
Albumin: 4 g/dL (ref 3.8–4.9)
Alkaline Phosphatase: 141 [IU]/L — ABNORMAL HIGH (ref 44–121)
BUN/Creatinine Ratio: 16 (ref 9–20)
BUN: 15 mg/dL (ref 6–24)
Bilirubin Total: 0.8 mg/dL (ref 0.0–1.2)
CO2: 23 mmol/L (ref 20–29)
Calcium: 8.9 mg/dL (ref 8.7–10.2)
Chloride: 101 mmol/L (ref 96–106)
Creatinine, Ser: 0.96 mg/dL (ref 0.76–1.27)
Globulin, Total: 2.5 g/dL (ref 1.5–4.5)
Glucose: 124 mg/dL — ABNORMAL HIGH (ref 70–99)
Potassium: 4.2 mmol/L (ref 3.5–5.2)
Sodium: 140 mmol/L (ref 134–144)
Total Protein: 6.5 g/dL (ref 6.0–8.5)
eGFR: 91 mL/min/{1.73_m2} (ref >59)

## 2023-09-04 LAB — LIPID PANEL
Chol/HDL Ratio: 2.6 ratio (ref 0.0–5.0)
Cholesterol, Total: 63 mg/dL — ABNORMAL LOW (ref 100–199)
HDL: 24 mg/dL — ABNORMAL LOW (ref >39)
LDL Chol Calc (NIH): 16 mg/dL (ref 0–99)
Triglycerides: 129 mg/dL (ref 0–149)
VLDL Cholesterol Cal: 23 mg/dL (ref 5–40)

## 2023-09-20 ENCOUNTER — Telehealth: Payer: Self-pay | Admitting: Family Medicine

## 2023-09-20 NOTE — Telephone Encounter (Signed)
Copied from CRM [id number]. Topic: General - Other >> Sep 20, 2023  9:32 AM Prudencio Pair wrote: Reason for CRM: Pt is requesting for Vanice Sarah to give him a call at [phone]. This is in regards to his [employer].

## 2023-09-20 NOTE — Telephone Encounter (Signed)
Copied from CRM [id number]. Topic: General - Other >> Sep 20, 2023  9:25 AM Georgeanna Harrison H wrote: Reason for CRM: PT would like for nurse Morrie Sheldon to give him a call.

## 2023-09-25 ENCOUNTER — Telehealth: Payer: Self-pay | Admitting: Pharmacist

## 2023-09-25 NOTE — Telephone Encounter (Signed)
Copied from CRM [id number]. Topic: General - Other >> Sep 25, 2023 12:42 PM Prudencio Pair wrote: Reason for CRM: Patient called stating that he received a text message from [facility] stating that doctor ordered medical supplies for him. Pt states he thinks it's a scam because when he tries to open the text, it says the site can't be reached. He stated the number is [phone], [facility]. Checked notes and looks as if Vanice Sarah, Pharmacist, may have tried to reach out to him but not sure if it was in regards to this issue. Pt would like for either nurse or Raynelle Fanning to give him a call in regards to this. CB # [phone].

## 2023-09-26 ENCOUNTER — Telehealth: Payer: Self-pay | Admitting: Family Medicine

## 2023-09-26 NOTE — Telephone Encounter (Signed)
3 boxes of Tresiba 200units/ml of patient assistance has arrived for patient. Patient called and aware to pick up from office.

## 2023-09-28 NOTE — Telephone Encounter (Signed)
Responded to patient via mychart

## 2023-10-18 ENCOUNTER — Other Ambulatory Visit: Payer: Self-pay | Admitting: Cardiology

## 2023-10-30 DIAGNOSIS — E1165 Type 2 diabetes mellitus with hyperglycemia: Secondary | ICD-10-CM | POA: Diagnosis not present

## 2023-11-13 ENCOUNTER — Telehealth: Payer: Self-pay | Admitting: Family Medicine

## 2023-11-13 NOTE — Telephone Encounter (Signed)
No samples at the moment

## 2023-11-13 NOTE — Telephone Encounter (Signed)
 Copied from CRM [id number]. Topic: Clinical - Medication Question >> Nov 13, 2023  3:47 PM [name] wrote: Reason for CRM: Patient would like to know if the nurses have any free samples of Eliquis.

## 2023-12-05 ENCOUNTER — Ambulatory Visit: Payer: Medicare Other | Admitting: Family Medicine

## 2023-12-05 DIAGNOSIS — M51369 Other intervertebral disc degeneration, lumbar region without mention of lumbar back pain or lower extremity pain: Secondary | ICD-10-CM

## 2023-12-05 DIAGNOSIS — Z0289 Encounter for other administrative examinations: Secondary | ICD-10-CM

## 2023-12-05 DIAGNOSIS — E1122 Type 2 diabetes mellitus with diabetic chronic kidney disease: Secondary | ICD-10-CM

## 2023-12-05 DIAGNOSIS — Z89512 Acquired absence of left leg below knee: Secondary | ICD-10-CM

## 2023-12-06 ENCOUNTER — Encounter: Payer: Self-pay | Admitting: Family Medicine

## 2024-01-03 ENCOUNTER — Ambulatory Visit: Payer: Medicare Other | Admitting: Family Medicine

## 2024-01-09 ENCOUNTER — Encounter: Payer: Self-pay | Admitting: Family Medicine

## 2024-01-25 ENCOUNTER — Ambulatory Visit: Payer: Medicare Other | Admitting: Family Medicine

## 2024-01-28 ENCOUNTER — Encounter: Payer: Self-pay | Admitting: Family Medicine

## 2024-01-28 ENCOUNTER — Telehealth: Payer: Self-pay | Admitting: Family Medicine

## 2024-01-28 ENCOUNTER — Ambulatory Visit (INDEPENDENT_AMBULATORY_CARE_PROVIDER_SITE_OTHER): Admitting: Family Medicine

## 2024-01-28 VITALS — BP 132/87 | HR 82 | Ht 74.0 in | Wt 200.0 lb

## 2024-01-28 DIAGNOSIS — Z0289 Encounter for other administrative examinations: Secondary | ICD-10-CM | POA: Diagnosis not present

## 2024-01-28 DIAGNOSIS — Z89512 Acquired absence of left leg below knee: Secondary | ICD-10-CM | POA: Diagnosis not present

## 2024-01-28 DIAGNOSIS — Z79899 Other long term (current) drug therapy: Secondary | ICD-10-CM

## 2024-01-28 DIAGNOSIS — E785 Hyperlipidemia, unspecified: Secondary | ICD-10-CM

## 2024-01-28 DIAGNOSIS — E1140 Type 2 diabetes mellitus with diabetic neuropathy, unspecified: Secondary | ICD-10-CM

## 2024-01-28 DIAGNOSIS — M51369 Other intervertebral disc degeneration, lumbar region without mention of lumbar back pain or lower extremity pain: Secondary | ICD-10-CM

## 2024-01-28 DIAGNOSIS — E1122 Type 2 diabetes mellitus with diabetic chronic kidney disease: Secondary | ICD-10-CM

## 2024-01-28 DIAGNOSIS — Z794 Long term (current) use of insulin: Secondary | ICD-10-CM | POA: Diagnosis not present

## 2024-01-28 DIAGNOSIS — I10 Essential (primary) hypertension: Secondary | ICD-10-CM

## 2024-01-28 LAB — BAYER DCA HB A1C WAIVED: HB A1C (BAYER DCA - WAIVED): 8 % — ABNORMAL HIGH (ref 4.8–5.6)

## 2024-01-28 MED ORDER — HYDROCODONE-ACETAMINOPHEN 10-325 MG PO TABS: 1.0000 | ORAL_TABLET | Freq: Two times a day (BID) | ORAL | 0 refills | Status: DC | PRN

## 2024-01-28 MED ORDER — ATORVASTATIN CALCIUM 20 MG PO TABS: 20.0000 mg | ORAL_TABLET | Freq: Every day | ORAL | 3 refills | Status: AC

## 2024-01-28 MED ORDER — METFORMIN HCL 1000 MG PO TABS: 1000.0000 mg | ORAL_TABLET | Freq: Two times a day (BID) | ORAL | 0 refills | Status: DC

## 2024-01-28 MED ORDER — NOVOLOG FLEXPEN 100 UNIT/ML ~~LOC~~ SOPN: 5.0000 [IU] | PEN_INJECTOR | Freq: Three times a day (TID) | SUBCUTANEOUS | 3 refills | Status: AC

## 2024-01-28 MED ORDER — TRESIBA FLEXTOUCH 200 UNIT/ML ~~LOC~~ SOPN: 76.0000 [IU] | PEN_INJECTOR | Freq: Every day | SUBCUTANEOUS | 5 refills | Status: DC

## 2024-01-28 MED ORDER — SEMAGLUTIDE (2 MG/DOSE) 8 MG/3ML ~~LOC~~ SOPN
2.0000 mg | PEN_INJECTOR | SUBCUTANEOUS | 3 refills | Status: DC
Start: 2024-01-28 — End: 2024-01-31

## 2024-01-28 NOTE — Addendum Note (Signed)
 Addended by: Arville Care on: 01/28/2024 03:30 PM   Modules accepted: Orders

## 2024-01-28 NOTE — Addendum Note (Signed)
 Addended by: Arville Care on: 01/28/2024 04:26 PM   Modules accepted: Orders

## 2024-01-28 NOTE — Progress Notes (Signed)
 BP 132/87   Pulse 82   Ht 6\' 2"  (1.88 m)   Wt 200 lb (90.7 kg)   SpO2 97%   BMI 25.68 kg/m    Subjective:   Patient ID: [name], male    DOB: Jan 23, 1966, 58 y.o.   MRN: [id number]  HPI: [name] is a 58 y.o. male presenting on 01/28/2024 for Medical Management of Chronic Issues, Diabetes, and Hypertension   HPI Type 2 diabetes mellitus Patient comes in today for recheck of his diabetes. Patient has been currently taking [place] and NovoLog and metformin and Ozempic. Patient is not currently on an ACE inhibitor/ARB. Patient has not seen an ophthalmologist this year. Patient denies any new issues with their feet. The symptom started onset as an adult hypertension and hyperlipidemia ARE RELATED TO DM   Hypertension Patient is currently on metoprolol, and their blood pressure today is 132/80. Patient denies any lightheadedness or dizziness. Patient denies headaches, blurred vision, chest pains, shortness of breath, or weakness. Denies any side effects from medication and is content with current medication.   Hyperlipidemia Patient is coming in for recheck of his hyperlipidemia. The patient is currently taking atorvastatin. They deny any issues with myalgias or history of liver damage from it. They deny any focal numbness or weakness or chest pain.   Pain assessment: Cause of pain-degenerative disc disease lumbar Pain location-lower back Pain on scale of 1-10- 5 Frequency-daily What increases pain-being on his feet some and walking more What makes pain Better-rest and medication Effects on ADL -minimal Any change in general medical condition-none  Current opioids rx-hydrocodone 10-3 25 twice daily as needed # meds rx-60/month Effectiveness of current meds-works well for the most part Adverse reactions from pain meds-none Morphine equivalent-20  Pill count performed-No Last drug screen -02/09/2023 ( high risk q21m, moderate risk q1m, low risk yearly ) Urine drug  screen today-yes Was the NCCSR reviewed-yes  If yes were their any concerning findings? -None Pain contract signed on: Today  Relevant past medical, surgical, family and social history reviewed and updated as indicated. Interim medical history since our last visit reviewed. Allergies and medications reviewed and updated.  Review of Systems  Constitutional:  Negative for chills and fever.  Eyes:  Negative for visual disturbance.  Respiratory:  Negative for shortness of breath and wheezing.   Cardiovascular:  Negative for chest pain and leg swelling.  Musculoskeletal:  Negative for back pain and gait problem.  Skin:  Negative for rash.  Neurological:  Negative for dizziness, weakness and light-headedness.  All other systems reviewed and are negative.   Per HPI unless specifically indicated above   Allergies as of 01/28/2024   No Known Allergies      Medication List        Accurate as of January 28, 2024  3:12 PM. If you have any questions, ask your nurse or doctor.          STOP taking these medications    amoxicillin-clavulanate 875-125 MG tablet Commonly known as: AUGMENTIN Stopped by: Elige Radon Niylah Hassan       TAKE these medications    apixaban 5 MG Tabs tablet Commonly known as: ELIQUIS Take 1 tablet (5 mg total) by mouth 2 (two) times daily.   atorvastatin 20 MG tablet Commonly known as: LIPITOR Take 1 tablet (20 mg total) by mouth daily.   DULoxetine 60 MG capsule Commonly known as: CYMBALTA Take 1 capsule (60 mg total) by mouth at bedtime and may  repeat dose one time if needed.   fluticasone 50 MCG/ACT nasal spray Commonly known as: FLONASE Place 1 spray into both nostrils 2 (two) times daily as needed for allergies or rhinitis.   FreeStyle [place] 2 Reader [place] Use as directed to test blood sugar up to 6 times daily as directed. DX: E 11.9   FreeStyle Libre 2 Sensor Misc Use as directed to test blood sugar up to 6 times daily as directed. DX: E  11.9   gabapentin 600 MG tablet Commonly known as: NEURONTIN Take 1 tablet (600 mg total) by mouth 3 (three) times daily.   HYDROcodone-acetaminophen 10-325 MG tablet Commonly known as: NORCO Take 1 tablet by mouth 2 (two) times daily as needed. What changed: Another medication with the same name was changed. Make sure you understand how and when to take each. Changed by: Elige Radon Zyair Russi   HYDROcodone-acetaminophen 10-325 MG tablet Commonly known as: NORCO Take 1 tablet by mouth 2 (two) times daily as needed. What changed: Another medication with the same name was changed. Make sure you understand how and when to take each. Changed by: Elige Radon Daney Moor   HYDROcodone-acetaminophen 10-325 MG tablet Commonly known as: NORCO Take 1 tablet by mouth 2 (two) times daily as needed. Start taking on: Mar 28, 2024 What changed: These instructions start on Mar 28, 2024. If you are unsure what to do until then, ask your doctor or other care provider. Changed by: Elige Radon Aniesha Haughn   metFORMIN 1000 MG tablet Commonly known as: GLUCOPHAGE Take 1 tablet (1,000 mg total) by mouth 2 (two) times daily.   metoprolol succinate 25 MG 24 hr tablet Commonly known as: TOPROL-XL TAKE 1 TABLET BY MOUTH EVERY DAY   NovoLOG FlexPen 100 UNIT/ML FlexPen Generic drug: insulin aspart Inject 5-10 Units into the skin 3 (three) times daily with meals. What changed: how much to take   Semaglutide (2 MG/DOSE) 8 MG/3ML Sopn Inject 2 mg as directed once a week.   Evaristo Bury FlexTouch 200 UNIT/ML FlexTouch Pen Generic drug: insulin degludec Inject 76 Units into the skin daily.         Objective:   BP 132/87   Pulse 82   Ht 6\' 2"  (1.88 m)   Wt 200 lb (90.7 kg)   SpO2 97%   BMI 25.68 kg/m   Wt Readings from Last 3 Encounters:  01/28/24 200 lb (90.7 kg)  09/03/23 199 lb (90.3 kg)  05/02/23 202 lb (91.6 kg)    Physical Exam Vitals and nursing note reviewed.  Constitutional:      General: He  is not in acute distress.    Appearance: He is well-developed. He is not diaphoretic.  Eyes:     General: No scleral icterus.    Conjunctiva/sclera: Conjunctivae normal.  Neck:     Thyroid: No thyromegaly.  Cardiovascular:     Rate and Rhythm: Normal rate and regular rhythm.     Heart sounds: Normal heart sounds. No murmur heard. Pulmonary:     Effort: Pulmonary effort is normal. No respiratory distress.     Breath sounds: Normal breath sounds. No wheezing.  Musculoskeletal:        General: No swelling.     Cervical back: Neck supple.  Lymphadenopathy:     Cervical: No cervical adenopathy.  Skin:    General: Skin is warm and dry.     Findings: No rash.  Neurological:     Mental Status: He is alert and oriented to person, place,  and time.     Coordination: Coordination normal.  Psychiatric:        Behavior: Behavior normal.       Assessment & Plan:   Problem List Items Addressed This Visit       Cardiovascular and Mediastinum   Essential hypertension, benign   Relevant Medications   atorvastatin (LIPITOR) 20 MG tablet     Endocrine   Type 2 diabetes mellitus (HCC) - Primary   Relevant Medications   atorvastatin (LIPITOR) 20 MG tablet   insulin degludec (TRESIBA FLEXTOUCH) 200 UNIT/ML FlexTouch Pen   metFORMIN (GLUCOPHAGE) 1000 MG tablet   Semaglutide, 2 MG/DOSE, 8 MG/3ML SOPN   insulin aspart (NOVOLOG FLEXPEN) 100 UNIT/ML FlexPen   Other Relevant Orders   Bayer DCA Hb A1c Waived   CBC with Differential/Platelet   CMP14+EGFR   Lipid panel   PSA, total and free   Microalbumin/Creatinine Ratio, Urine     Musculoskeletal and Integument   DDD (degenerative disc disease), lumbar   Relevant Medications   HYDROcodone-acetaminophen (NORCO) 10-325 MG tablet   HYDROcodone-acetaminophen (NORCO) 10-325 MG tablet (Start on 03/28/2024)   HYDROcodone-acetaminophen (NORCO) 10-325 MG tablet     Other   Dyslipidemia   Relevant Medications   atorvastatin (LIPITOR) 20 MG  tablet   Pain management contract signed   Relevant Medications   HYDROcodone-acetaminophen (NORCO) 10-325 MG tablet   HYDROcodone-acetaminophen (NORCO) 10-325 MG tablet (Start on 03/28/2024)   HYDROcodone-acetaminophen (NORCO) 10-325 MG tablet   S/P BKA (below knee amputation) unilateral, left (HCC)   Relevant Medications   HYDROcodone-acetaminophen (NORCO) 10-325 MG tablet   HYDROcodone-acetaminophen (NORCO) 10-325 MG tablet (Start on 03/28/2024)   HYDROcodone-acetaminophen (NORCO) 10-325 MG tablet   Other Visit Diagnoses       Controlled substance agreement signed       Relevant Orders   ToxASSURE [place] 13 (MW), Urine       Continue current medicine, seems to be doing well.  Still has a little congestion but clearing,  A1c was 8.0 which is up because has been recently ill, is going to focus on getting it back down. Follow up plan: Return in about 3 months (around 04/29/2024), or if symptoms worsen or fail to improve, for Diabetes and hypertension and cholesterol.  Counseling provided for all of the vaccine components Orders Placed This Encounter  Procedures   Bayer DCA Hb A1c Waived   CBC with Differential/Platelet   CMP14+EGFR   Lipid panel   PSA, total and free   Microalbumin/Creatinine Ratio, Urine   ToxASSURE [place] 13 (MW), Urine    Arville Care, MD [facility] 01/28/2024, 3:12 PM

## 2024-01-28 NOTE — Telephone Encounter (Signed)
 Copied from CRM [id number]. Topic: General - Other >> Jan 28, 2024  4:13 PM Everette C wrote: Reason for CRM: The patient would like to be contacted by a member of clinical staff when possible to follow up on insulin paperwork they received during their appointment today 01/28/24  Please contact when available

## 2024-01-28 NOTE — Telephone Encounter (Signed)
 Pt concerned that on his AVS it says that he needs to pick up his medications. He uses mail order pharmacy and it is in [place]. That  is where Dettinger sent the medication to. Informed pt that it is a automatic letter on the AVS. That it would always have that in his AVS whether he uses local or mail order. Pt understood and has no further concerns.

## 2024-01-29 LAB — CBC WITH DIFFERENTIAL/PLATELET
Basophils Absolute: 0 x10E3/uL (ref 0.0–0.2)
Basos: 1 %
EOS (ABSOLUTE): 0 x10E3/uL (ref 0.0–0.4)
Eos: 1 %
Hematocrit: 49.7 % (ref 37.5–51.0)
Hemoglobin: 17.2 g/dL (ref 13.0–17.7)
Immature Grans (Abs): 0 x10E3/uL (ref 0.0–0.1)
Immature Granulocytes: 0 %
Lymphocytes Absolute: 1.9 x10E3/uL (ref 0.7–3.1)
Lymphs: 34 %
MCH: 31.2 pg (ref 26.6–33.0)
MCHC: 34.6 g/dL (ref 31.5–35.7)
MCV: 90 fL (ref 79–97)
Monocytes Absolute: 0.4 x10E3/uL (ref 0.1–0.9)
Monocytes: 8 %
Neutrophils Absolute: 3.1 x10E3/uL (ref 1.4–7.0)
Neutrophils: 56 %
Platelets: 189 x10E3/uL (ref 150–450)
RBC: 5.52 x10E6/uL (ref 4.14–5.80)
RDW: 12.7 % (ref 11.6–15.4)
WBC: 5.5 x10E3/uL (ref 3.4–10.8)

## 2024-01-29 LAB — CMP14+EGFR
ALT: 31 IU/L (ref 0–44)
AST: 19 IU/L (ref 0–40)
Albumin: 4.1 g/dL (ref 3.8–4.9)
Alkaline Phosphatase: 196 IU/L — ABNORMAL HIGH (ref 44–121)
BUN/Creatinine Ratio: 18 (ref 9–20)
BUN: 16 mg/dL (ref 6–24)
Bilirubin Total: 1.1 mg/dL (ref 0.0–1.2)
CO2: 24 mmol/L (ref 20–29)
Calcium: 9.2 mg/dL (ref 8.7–10.2)
Chloride: 101 mmol/L (ref 96–106)
Creatinine, Ser: 0.91 mg/dL (ref 0.76–1.27)
Globulin, Total: 2.4 g/dL (ref 1.5–4.5)
Glucose: 123 mg/dL — ABNORMAL HIGH (ref 70–99)
Potassium: 4.5 mmol/L (ref 3.5–5.2)
Sodium: 140 mmol/L (ref 134–144)
Total Protein: 6.5 g/dL (ref 6.0–8.5)
eGFR: 97 mL/min/{1.73_m2} (ref >59)

## 2024-01-29 LAB — LIPID PANEL
Chol/HDL Ratio: 2.8 ratio (ref 0.0–5.0)
Cholesterol, Total: 83 mg/dL — ABNORMAL LOW (ref 100–199)
HDL: 30 mg/dL — ABNORMAL LOW (ref >39)
LDL Chol Calc (NIH): 36 mg/dL (ref 0–99)
Triglycerides: 83 mg/dL (ref 0–149)
VLDL Cholesterol Cal: 17 mg/dL (ref 5–40)

## 2024-01-29 LAB — PSA, TOTAL AND FREE
PSA, Free Pct: 60 %
PSA, Free: 0.12 ng/mL
Prostate Specific Ag, Serum: 0.2 ng/mL (ref 0.0–4.0)

## 2024-01-30 LAB — MICROALBUMIN / CREATININE URINE RATIO
Creatinine, Urine: 271.1 mg/dL
Microalb/Creat Ratio: 30 mg/g{creat} — ABNORMAL HIGH (ref 0–29)
Microalbumin, Urine: 80.4 ug/mL

## 2024-01-31 ENCOUNTER — Encounter: Payer: Self-pay | Admitting: Family Medicine

## 2024-01-31 ENCOUNTER — Telehealth: Payer: Self-pay | Admitting: *Deleted

## 2024-01-31 DIAGNOSIS — E1122 Type 2 diabetes mellitus with diabetic chronic kidney disease: Secondary | ICD-10-CM

## 2024-01-31 LAB — TOXASSURE SELECT 13 (MW), URINE

## 2024-01-31 MED ORDER — SEMAGLUTIDE (2 MG/DOSE) 8 MG/3ML ~~LOC~~ SOPN: 2.0000 mg | PEN_INJECTOR | SUBCUTANEOUS | 1 refills | Status: AC

## 2024-01-31 MED ORDER — TRESIBA FLEXTOUCH 200 UNIT/ML ~~LOC~~ SOPN: 76.0000 [IU] | PEN_INJECTOR | Freq: Every day | SUBCUTANEOUS | 2 refills | Status: DC

## 2024-01-31 NOTE — Addendum Note (Signed)
 Addended by: Dorene Sorrow on: 01/31/2024 04:43 PM   Modules accepted: Orders

## 2024-01-31 NOTE — Telephone Encounter (Signed)
 Referral placed to [facility].  Refills of [place] and Ozempic to [facility] in [place] in the meantime.  Will route to [place] to make aware.

## 2024-01-31 NOTE — Telephone Encounter (Signed)
 Called patient with lab results and he is upset that the 3 medications that Dr Dettinger sent to [facility] (patient assistance) they do not carry. Wants to know what to do now?

## 2024-01-31 NOTE — Telephone Encounter (Signed)
 [name] can you help me with this, did I send him to the wrong pharmacy

## 2024-01-31 NOTE — Telephone Encounter (Signed)
 I don't know anything about [employer] Does he want to re-enroll in patient assistance with [place]? There is no help for eliquis, but we can get him the insulin and ozempic Just let me know

## 2024-02-01 ENCOUNTER — Ambulatory Visit: Payer: Medicare Other

## 2024-02-01 ENCOUNTER — Telehealth: Payer: Self-pay | Admitting: Pharmacist

## 2024-02-01 ENCOUNTER — Telehealth: Payer: Self-pay

## 2024-02-01 ENCOUNTER — Other Ambulatory Visit (HOSPITAL_COMMUNITY): Payer: Self-pay

## 2024-02-01 VITALS — Ht 74.0 in | Wt 200.0 lb

## 2024-02-01 DIAGNOSIS — Z0000 Encounter for general adult medical examination without abnormal findings: Secondary | ICD-10-CM

## 2024-02-01 NOTE — Telephone Encounter (Signed)
 Copied from CRM [id number]. Topic: Clinical - Prescription Issue >> Feb 01, 2024 12:05 PM Antwanette L wrote: Reason for CRM: Patient is calling to get an update on his prescription refills for Tresiba, Novolog, and Ozempic 2mg . The patient said [facility] [employer] reached out to him but he cannot afford the medicine. Patient is requesting a call back at [phone]

## 2024-02-01 NOTE — Progress Notes (Unsigned)
 Pharmacy Medication Assistance Program Note    02/07/2024  Patient ID: [name], male   DOB: 10-13-1966, 58 y.o.   MRN: [id number]     02/01/2024  Outreach Medication One  Manufacturer Medication One [employer] [facility] Evaristo Bury  Dose of Evaristo Bury U200  Type of Radiographer, therapeutic Assistance  Date Application Submitted to Manufacturer 02/01/2024  Method Application Sent to Manufacturer Online  Patient Assistance Determination Approved  Approval Start Date 02/06/2024  Approval End Date 11/05/2024         02/07/2024  Patient ID: [name], male  DOB: March 05, 1966, 58 y.o.  MRN:  [id number]     02/01/2024  Outreach Medication Two  Manufacturer Medication Two Novo Nordisk  [facility] Ozempic  Dose of Ozempic 2MG   Type of Journalist, newspaper to [employer] Fax  Date Application Submitted to Manufacturer 02/01/2024  Patient Assistance Determination Approved  Approval Start Date 02/06/2024        02/07/2024  Patient ID: [name], male  DOB: 1966/02/21, 58 y.o.  MRN:  [id number]     02/07/2024  Outreach Medication Three  Manufacturer Medication Three Novo Nordisk  [facility] Novolog  Type of Radiographer, therapeutic Assistance  Patient Assistance Determination Approved  Approval Start Date 02/06/2024  Approval End Date 11/05/2024     Renewal approved - should arrive to office in 10-14 business days. Expect delays in shipments.

## 2024-02-01 NOTE — Telephone Encounter (Signed)
 Called and spoke with patient. Advised that Raynelle Fanning is working on his patient assistance and she would reach out to him. Patient verbalized understanding

## 2024-02-01 NOTE — Telephone Encounter (Signed)
 Please re-enroll novo nordisk PAP for insulins (tresiba and novolog) and Ozempic 2mg  Send me entire PAP Thanks!

## 2024-02-01 NOTE — Patient Instructions (Signed)
 Mr. [name] , Thank you for taking time to come for your Medicare Wellness Visit. I appreciate your ongoing commitment to your health goals. Please review the following plan we discussed and let me know if I can assist you in the future.   Referrals/Orders/Follow-Ups/Clinician Recommendations: Aim for 30 minutes of exercise or brisk walking, 6-8 glasses of water, and 5 servings of fruits and vegetables each day.  This is a list of the screening recommended for you and due dates:  Health Maintenance  Topic Date Due   Stool Blood Test  03/05/2019   COVID-19 Vaccine (3 - Moderna risk series) 07/12/2020   Colon Cancer Screening  05/01/2024*   Eye exam for diabetics  07/23/2024   Hemoglobin A1C  07/30/2024   Yearly kidney function blood test for diabetes  01/27/2025   Yearly kidney health urinalysis for diabetes  01/27/2025   Medicare Annual Wellness Visit  01/31/2025   DTaP/Tdap/Td vaccine (2 - Td or Tdap) 07/08/2030   Flu Shot  Completed   Hepatitis C Screening  Completed   HIV Screening  Completed   Zoster (Shingles) Vaccine  Completed   HPV Vaccine  Aged Out   Pneumococcal Vaccination  Discontinued   Complete foot exam   Discontinued  *Topic was postponed. The date shown is not the original due date.    Advanced directives: (ACP Link)Information on Advanced Care Planning can be found at [facility] [place] Advance Health Care Directives Advance Health Care Directives. [email or web address]   Next Medicare Annual Wellness Visit scheduled for next year: Yes

## 2024-02-01 NOTE — Progress Notes (Signed)
 Subjective:   [name] is a 58 y.o. who presents for a Medicare Wellness preventive visit.  Visit Complete: Virtual I connected with  [name] on 02/01/24 by a audio enabled telemedicine application and verified that I am speaking with the correct person using two identifiers.  Patient Location: Home  Provider Location: Home Office  I discussed the limitations of evaluation and management by telemedicine. The patient expressed understanding and agreed to proceed.  Vital Signs: Because this visit was a virtual/telehealth visit, some criteria may be missing or patient reported. Any vitals not documented were not able to be obtained and vitals that have been documented are patient reported.  VideoDeclined- This patient declined Librarian, academic. Therefore the visit was completed with audio only.  Persons Participating in Visit: Patient.  AWV Questionnaire: Yes: Patient Medicare AWV questionnaire was completed by the patient on 01/31/24; I have confirmed that all information answered by patient is correct and no changes since this date.  Cardiac Risk Factors include: advanced age (>67men, >50 women);diabetes mellitus;male gender;hypertension;dyslipidemia     Objective:    Today's Vitals   02/01/24 1037  Weight: 200 lb (90.7 kg)  Height: 6\' 2"  (1.88 m)   Body mass index is 25.68 kg/m.     02/01/2024   12:56 PM 03/23/2023    2:00 AM 02/05/2023    2:50 PM 01/30/2023    2:49 PM 07/26/2021   10:16 AM 02/19/2020   11:15 AM 02/18/2020   11:12 AM  Advanced Directives  Does Patient Have a Medical Advance Directive? No No No Yes No No No  Type of Theme park manager;Living will     Copy of Healthcare Power of Attorney in Chart?    No - copy requested     Would patient like information on creating a medical advance directive? Yes (MAU/Ambulatory/Procedural Areas - Information given) No - Patient declined   No - Patient  declined No - Patient declined No - Patient declined    Current Medications (verified) Outpatient Encounter Medications as of 02/01/2024  Medication Sig   apixaban (ELIQUIS) 5 MG TABS tablet Take 1 tablet (5 mg total) by mouth 2 (two) times daily.   atorvastatin (LIPITOR) 20 MG tablet Take 1 tablet (20 mg total) by mouth daily.   Continuous Blood Gluc Receiver (FREESTYLE LIBRE 2 READER) DEVI Use as directed to test blood sugar up to 6 times daily as directed. DX: E 11.9   Continuous Blood Gluc Sensor (FREESTYLE LIBRE 2 SENSOR) MISC Use as directed to test blood sugar up to 6 times daily as directed. DX: E 11.9   DULoxetine (CYMBALTA) 60 MG capsule Take 1 capsule (60 mg total) by mouth at bedtime and may repeat dose one time if needed.   fluticasone (FLONASE) 50 MCG/ACT nasal spray Place 1 spray into both nostrils 2 (two) times daily as needed for allergies or rhinitis.   gabapentin (NEURONTIN) 600 MG tablet Take 1 tablet (600 mg total) by mouth 3 (three) times daily.   HYDROcodone-acetaminophen (NORCO) 10-325 MG tablet Take 1 tablet by mouth 2 (two) times daily as needed.   [START ON 03/28/2024] HYDROcodone-acetaminophen (NORCO) 10-325 MG tablet Take 1 tablet by mouth 2 (two) times daily as needed.   HYDROcodone-acetaminophen (NORCO) 10-325 MG tablet Take 1 tablet by mouth 2 (two) times daily as needed.   insulin aspart (NOVOLOG FLEXPEN) 100 UNIT/ML FlexPen Inject 5-10 Units into the skin 3 (three) times daily  with meals.   insulin degludec (TRESIBA FLEXTOUCH) 200 UNIT/ML FlexTouch Pen Inject 76 Units into the skin daily.   metFORMIN (GLUCOPHAGE) 1000 MG tablet Take 1 tablet (1,000 mg total) by mouth 2 (two) times daily.   metoprolol succinate (TOPROL-XL) 25 MG 24 hr tablet TAKE 1 TABLET BY MOUTH EVERY DAY   Semaglutide, 2 MG/DOSE, 8 MG/3ML SOPN Inject 2 mg as directed once a week.   No facility-administered encounter medications on file as of 02/01/2024.    Allergies (verified) Patient has no  known allergies.   History: Past Medical History:  Diagnosis Date   Charcot ankle    Essential hypertension    Osteomyelitis of toe of right foot (HCC)    Paroxysmal atrial fibrillation (HCC)    Recurrent boils    Type 2 diabetes mellitus with diabetic neuropathy [facility])    Diagnosed age 39   Past Surgical History:  Procedure Laterality Date   AMPUTATION Right 06/17/2014   Procedure: PARTIAL AMPUTATION RIGHT 3RD TOE;  Surgeon: [name], DPM;  Location: [facility];  Service: Podiatry;  Laterality: Right;   AMPUTATION Right 09/02/2014   Procedure: PARTIAL AMPUTATION 2ND TOE RIGHT FOOT;  Surgeon: [name], DPM;  Location: [facility];  Service: Podiatry;  Laterality: Right;   AMPUTATION Right 04/01/2015   Procedure: AMPUTATION DIGIT 1ST TOE RIGHT FOOT;  Surgeon: Laurell Josephs, DPM;  Location: [facility];  Service: Podiatry;  Laterality: Right;   AMPUTATION Right 06/29/2016   pinkeye    AMPUTATION Right 10/24/2019   Procedure: PARTIAL THIRD RAY AMPUTATION;  Surgeon: Ferman Hamming, DPM;  Location: [facility];  Service: Podiatry;  Laterality: Right;   AMPUTATION Right 02/19/2020   Procedure: AMPUTATION BELOW KNEE; RIGHT;  Surgeon: [name], MD;  Location: [facility];  Service: General;  Laterality: Right;   FOOT AMPUTATION Left    FOOT SURGERY Left 03/2017   HERNIA REPAIR Bilateral    and 3rd hernia repair does not remember ehat side   IRRIGATION AND DEBRIDEMENT ABSCESS Right 11/05/2019   Procedure: IRRIGATION AND DRAINAGE ABSCESS RIGHT ANKLE;  Surgeon: Ferman Hamming, DPM;  Location: [facility];  Service: Podiatry;  Laterality: Right;   TEE WITHOUT CARDIOVERSION N/A 10/29/2019   Procedure: TRANSESOPHAGEAL ECHOCARDIOGRAM (TEE) WITH PROPOFOL;  Surgeon: Pricilla Riffle, MD;  Location: [facility];  Service: Cardiovascular;  Laterality: N/A;   Family History  Problem Relation Age of Onset   Diabetes Mother    Stroke Father    Asthma Father    COPD Father    Aneurysm Father         AAA   Diabetes Brother    Stroke Brother        2008   Colon cancer Neg Hx    Heart disease Neg Hx    Rectal cancer Neg Hx    Stomach cancer Neg Hx    Social History   Socioeconomic History   Marital status: Significant Other    Spouse name: Not on file   Number of children: 1   Years of education: Not on file   Highest education level: Some college, no degree  Occupational History   Occupation: Receiving  Tobacco Use   Smoking status: Never   Smokeless tobacco: Never  Vaping Use   Vaping status: Never Used  Substance and Sexual Activity   Alcohol use: No   Drug use: No   Sexual activity: Yes    Birth control/protection: None  Other Topics Concern   Not on file  Social History Narrative   Has a significant other - lives alone - son lives in [place]   Social Drivers of Health   Financial Resource Strain: Low Risk  (02/01/2024)   Overall Financial Resource Strain (CARDIA)    Difficulty of Paying Living Expenses: Not very hard  Recent Concern: Financial Resource Strain - Medium Risk (01/02/2024)   Overall Financial Resource Strain (CARDIA)    Difficulty of Paying Living Expenses: Somewhat hard  Food Insecurity: Food Insecurity Present (02/01/2024)   Hunger Vital Sign    Worried About Running Out of Food in the Last Year: Sometimes true    Ran Out of Food in the Last Year: Sometimes true  Transportation Needs: No Transportation Needs (02/01/2024)   PRAPARE - Administrator, Civil Service (Medical): No    Lack of Transportation (Non-Medical): No  Physical Activity: Insufficiently Active (02/01/2024)   Exercise Vital Sign    Days of Exercise per Week: 3 days    Minutes of Exercise per Session: 30 min  Stress: No Stress Concern Present (02/01/2024)   [employer] of Occupational Health - Occupational Stress Questionnaire    Feeling of Stress : Not at all  Social Connections: Moderately Isolated (02/01/2024)   Social Connection and Isolation Panel  [NHANES]    Frequency of Communication with Friends and Family: More than three times a week    Frequency of Social Gatherings with Friends and Family: Twice a week    Attends Religious Services: More than 4 times per year    Active Member of [employer] or Organizations: No    Attends Engineer, structural: Never    Marital Status: Never married    Tobacco Counseling Counseling given: Not Answered    Clinical Intake:  Pre-visit preparation completed: Yes  Pain : No/denies pain     Diabetes: Yes CBG done?: No Did pt. bring in CBG monitor from home?: No  Lab Results  Component Value Date   HGBA1C 8.0 (H) 01/28/2024   HGBA1C 6.6 (H) 09/03/2023   HGBA1C 7.9 (H) 03/23/2023     How often do you need to have someone help you when you read instructions, pamphlets, or other written materials from your doctor or pharmacy?: 1 - Never  Interpreter Needed?: No  Information entered by :: Kandis Fantasia LPN   Activities of Daily Living     01/31/2024    7:14 AM 03/23/2023    2:30 AM  In your present state of health, do you have any difficulty performing the following activities:  Hearing? 0 0  Vision? 0 0  Difficulty concentrating or making decisions? 0 0  Walking or climbing stairs? 0 0  Dressing or bathing? 0 0  Doing errands, shopping? 0 0  Preparing Food and eating ? N   Using the Toilet? N   In the past six months, have you accidently leaked urine? N   Do you have problems with loss of bowel control? N   Managing your Medications? N   Managing your Finances? N   Housekeeping or managing your Housekeeping? N     Patient Care Team: Dettinger, Elige Radon, MD as PCP - General (Family Medicine) Jonelle Sidle, MD as PCP - Cardiology (Cardiology) Danella Maiers, [facility] (Pharmacist) Hilarie Fredrickson, MD as Consulting Physician (Gastroenterology) Moody Bruins as Physician Assistant (Chiropractic Medicine) Delora Fuel, OD (Optometry)  Indicate any recent  Medical Services you may have received from other than [facility] providers in the past year (  date may be approximate).     Assessment:   This is a routine wellness examination for [name].  Hearing/Vision screen Hearing Screening - Comments:: Denies hearing difficulties   Vision Screening - Comments:: No vision problems; will schedule routine eye exam soon     Goals Addressed             This Visit's Progress    Prevent falls         Depression Screen     02/01/2024   10:52 AM 01/28/2024    2:56 PM 09/03/2023    3:08 PM 05/02/2023   10:42 AM 01/30/2023    2:48 PM 01/25/2023    9:52 AM 10/25/2022    9:27 AM  PHQ 2/9 Scores  PHQ - 2 Score 0 0 0 0 0 0 0  PHQ- 9 Score     0  0    Fall Risk     01/31/2024    7:14 AM 01/28/2024    2:56 PM 09/03/2023    3:08 PM 05/02/2023   10:41 AM 01/30/2023    2:46 PM  Fall Risk   Falls in the past year? 0 1 0 1 0  Number falls in past yr: 0 1  1 0  Injury with Fall? 0 0  0 0  Risk for fall due to : Impaired mobility;Impaired balance/gait Orthopedic patient;History of fall(s);Impaired balance/gait;Impaired mobility  Impaired balance/gait;Orthopedic patient;Impaired mobility No Fall Risks  Follow up Falls evaluation completed;Education provided;Falls prevention discussed Falls evaluation completed  Falls evaluation completed Falls prevention discussed    MEDICARE RISK AT HOME:  Medicare Risk at Home Any stairs in or around the home?: (Patient-Rptd) Yes If so, are there any without handrails?: (Patient-Rptd) Yes Home free of loose throw rugs in walkways, pet beds, electrical cords, etc?: (Patient-Rptd) Yes Adequate lighting in your home to reduce risk of falls?: (Patient-Rptd) Yes Life alert?: (Patient-Rptd) No Use of a cane, walker or w/c?: (Patient-Rptd) Yes Grab bars in the bathroom?: (Patient-Rptd) Yes Shower chair or bench in shower?: (Patient-Rptd) Yes Elevated toilet seat or a handicapped toilet?: (Patient-Rptd) Yes  TIMED UP AND  GO:  Was the test performed?  No  Cognitive Function: 6CIT completed    07/08/2020   10:15 AM  MMSE - Mini Mental State Exam  Orientation to time 5  Orientation to Place 5  Registration 3  Attention/ Calculation 5  Recall 3  Language- name 2 objects 2  Language- repeat 1  Language- follow 3 step command 3  Language- read & follow direction 1  Write a sentence 1  Copy design 1  Total score 30        02/01/2024   12:57 PM 01/30/2023    2:50 PM  6CIT Screen  What Year? 0 points 0 points  What month? 0 points 0 points  What time? 0 points 0 points  Count back from 20 0 points 0 points  Months in reverse 0 points 0 points  Repeat phrase 0 points 0 points  Total Score 0 points 0 points    Immunizations Immunization History  Administered Date(s) Administered   Influenza, Seasonal, Injecte, Preservative Fre 10/15/2015, 08/05/2016, 07/24/2023   Influenza,inj,Quad PF,6+ Mos 10/15/2015, 08/05/2016, 08/21/2017, 08/07/2018, 07/22/2019, 09/01/2020, 10/12/2021, 07/26/2022   Moderna Sars-Covid-2 Vaccination 05/20/2020, 06/14/2020   Pneumococcal Conjugate-13 07/22/2019, 07/08/2020   Tdap 07/08/2020   Zoster Recombinant(Shingrix) 06/30/2019, 10/06/2019    Screening Tests Health Maintenance  Topic Date Due   COLON CANCER SCREENING ANNUAL FOBT  03/05/2019  COVID-19 Vaccine (3 - Moderna risk series) 07/12/2020   Colonoscopy  05/01/2024 (Originally 08/02/2022)   OPHTHALMOLOGY EXAM  07/23/2024   HEMOGLOBIN A1C  07/30/2024   Diabetic kidney evaluation - eGFR measurement  01/27/2025   Diabetic kidney evaluation - Urine ACR  01/27/2025   Medicare Annual Wellness (AWV)  01/31/2025   DTaP/Tdap/Td (2 - Td or Tdap) 07/08/2030   INFLUENZA VACCINE  Completed   Hepatitis C Screening  Completed   HIV Screening  Completed   Zoster Vaccines- Shingrix  Completed   HPV VACCINES  Aged Out   Pneumococcal Vaccine 73-33 Years old  Discontinued   FOOT EXAM  Discontinued    Health  Maintenance  Health Maintenance Due  Topic Date Due   COLON CANCER SCREENING ANNUAL FOBT  03/05/2019   COVID-19 Vaccine (3 - Moderna risk series) 07/12/2020    Additional Screening:  Vision Screening: Recommended annual ophthalmology exams for early detection of glaucoma and other disorders of the eye.  Dental Screening: Recommended annual dental exams for proper oral hygiene  Community Resource Referral / Chronic Care Management: CRR required this visit?  No   CCM required this visit?  No     Plan:     I have personally reviewed and noted the following in the patient's chart:   Medical and social history Use of alcohol, tobacco or illicit drugs  Current medications and supplements including opioid prescriptions. Patient is not currently taking opioid prescriptions. Functional ability and status Nutritional status Physical activity Advanced directives List of other physicians Hospitalizations, surgeries, and ER visits in previous 12 months Vitals Screenings to include cognitive, depression, and falls Referrals and appointments  In addition, I have reviewed and discussed with patient certain preventive protocols, quality metrics, and best practice recommendations. A written personalized care plan for preventive services as well as general preventive health recommendations were provided to patient.     Kandis Fantasia [place], [place]   6/57/8469   After Visit Summary: (MyChart) Due to this being a telephonic visit, the after visit summary with patients personalized plan was offered to patient via MyChart   Notes: Nothing significant to report at this time.

## 2024-02-05 ENCOUNTER — Telehealth: Payer: Self-pay

## 2024-02-05 NOTE — Progress Notes (Signed)
 Care Guide Pharmacy Note  02/05/2024 Name: [name] MRN: [id number] DOB: Nov 04, 1966  Referred By: Dettinger, Elige Radon, MD Reason for referral: Care Coordination (Outreach to schedule with Pharm d )   [name] is a 58 y.o. year old male who is a primary care patient of Dettinger, Elige Radon, MD.  [name] was referred to the pharmacist for assistance related to: DMII  Successful contact was made with the patient to discuss pharmacy services including being ready for the pharmacist to call at least 5 minutes before the scheduled appointment time and to have medication bottles and any blood pressure readings ready for review. The patient agreed to meet with the pharmacist via telephone visit on (date/time).02/29/2024  [name] , RMA     [employer]  [facility], [facility] Guide  Direct Dial: [phone]  Website: [email or web address]

## 2024-02-07 ENCOUNTER — Other Ambulatory Visit: Payer: Self-pay | Admitting: Cardiology

## 2024-02-11 NOTE — Telephone Encounter (Signed)
 The patient called stating he would like to speak with Raynelle Fanning as soon as possible. Please assist patient further.

## 2024-02-19 ENCOUNTER — Other Ambulatory Visit: Payer: Self-pay | Admitting: Cardiology

## 2024-02-20 ENCOUNTER — Telehealth: Payer: Self-pay

## 2024-02-20 NOTE — Telephone Encounter (Signed)
 Called to inform patient that their Ozempic, Novolog, and Horace Lye have arrived in office and ready for pick up. Patient verbalized understanding. [facility] 04/16

## 2024-02-29 ENCOUNTER — Other Ambulatory Visit: Admitting: Pharmacist

## 2024-02-29 ENCOUNTER — Telehealth: Payer: Self-pay | Admitting: Pharmacist

## 2024-02-29 DIAGNOSIS — E119 Type 2 diabetes mellitus without complications: Secondary | ICD-10-CM

## 2024-02-29 NOTE — Progress Notes (Signed)
 02/29/2024 Name: [name] MRN: [id number] DOB: [birth year]  Chief Complaint  Patient presents with   Diabetes    [name] is a 58 y.o. year old male who presented for a telephone visit.   They were referred to the pharmacist by their PCP for assistance in managing diabetes and medication access.    Subjective:  Patient would like to get back on his Freestyle Libre 3plus CGM.  He currently has his medications and is enrolled in the novo nordisk patient assistance.   Care Team: Primary Care Provider: Dettinger, Lucio Sabin, MD    Medication Access/Adherence  [facility] Pharmacy:  [phone] [place], [place] - [address] [place] [address] Phone: [phone] Fax: [phone]  [facility] - [place], [place] - [address] [place] [address] Phone: [phone] Fax: [phone]  [email or web address] - [place], [place] - [address] [employer] [address] [address] [place] [address] Phone: [phone] Fax: [phone]  [facility] - [place], [place] - [address] [place] [address] Phone: [phone] Fax: [phone]   Patient reports affordability concerns with their medications: Yes  Patient reports access/transportation concerns to their pharmacy: No  Patient reports adherence concerns with their medications:  No     Diabetes:  Current medications:  TRESIBA  72-76 UNITS NIGHTLY, NOVOLOG  10 UNITS 3 TIMES DAILY WITH MEALS, OZEMPIC  2MG   Current glucose readings: n/a OUT OF LIBRE  NEW ORDER SUBMITTED TO [facility]    Current meal patterns:  Discussed meal planning options and Plate method for healthy eating Avoid sugary drinks and desserts Incorporate balanced protein, non starchy veggies, 1 serving of carbohydrate with each meal Increase water intake Increase physical activity as able  Current physical activity:  encouraged as able; limited  Current medication access support: novo  Objective:  Lab Results  Component Value Date   HGBA1C 8.0 (H) 01/28/2024    Lab Results  Component Value Date   CREATININE 0.91 01/28/2024   BUN 16 01/28/2024   NA 140 01/28/2024   K 4.5 01/28/2024   CL 101 01/28/2024   CO2 24 01/28/2024    Lab Results  Component Value Date   CHOL 83 (L) 01/28/2024   HDL 30 (L) 01/28/2024   LDLCALC 36 01/28/2024   TRIG 83 01/28/2024   CHOLHDL 2.8 01/28/2024    Medications Reviewed Today     Reviewed by Delilah Fend, [facility] (Pharmacist) on 04/02/24 at 1536  Med List Status: <None>   Medication Order Taking? Sig Documenting Provider Last Dose Status Informant  apixaban  (ELIQUIS ) 5 MG TABS tablet [id number] No Take 1 tablet (5 mg total) by mouth 2 (two) times daily. Gerard Knight, MD Taking Active Self, Pharmacy Records  atorvastatin  (LIPITOR) 20 MG tablet [id number] No Take 1 tablet (20 mg total) by mouth daily. Dettinger, Lucio Sabin, MD Taking Active   Continuous Blood Gluc Receiver (FREESTYLE LIBRE 2 READER) DEVI [id number] No Use as directed to test blood sugar up to 6 times daily as directed. DX: E 11.9 Dettinger, Lucio Sabin, MD Taking Active Self, Pharmacy Records           Med Note Alpheus Arvin, Haig Levan Nov 17, 2021 10:56 AM) [facility]IA [facility]   Continuous Blood Gluc Sensor (FREESTYLE LIBRE 2 SENSOR) MISC [id number] No Use as directed to test blood sugar up to  6 times daily as directed. DX: E 11.9 Dettinger, Lucio Sabin, MD Taking Active Self, Pharmacy Records           Med Note Alpheus Arvin, Haig Levan Nov 17, 2021 10:56 AM) [facility]IA [facility]  DULoxetine  (CYMBALTA ) 60 MG capsule [id number] No Take 1 capsule (60 mg total) by mouth at bedtime and may repeat dose one time if needed. Dettinger, Lucio Sabin, MD Taking Active   fluticasone  (FLONASE ) 50 MCG/ACT nasal spray [id number] No Place 1 spray into both nostrils 2 (two) times  daily as needed for allergies or rhinitis. Dettinger, Lucio Sabin, MD Taking Active Self, Pharmacy Records           Med Note (TOUSEY, LAINE M   Mon Mar 26, 2023  3:54 PM) 03/26/23- reports has not needed recently  gabapentin  (NEURONTIN ) 600 MG tablet [id number] No Take 1 tablet (600 mg total) by mouth 3 (three) times daily. Dettinger, Lucio Sabin, MD Taking Active   HYDROcodone -acetaminophen  [facility]) 10-325 MG tablet [id number] No Take 1 tablet by mouth 2 (two) times daily as needed. Dettinger, Lucio Sabin, MD Taking Active   HYDROcodone -acetaminophen  [facility]) 10-325 MG tablet [id number] No Take 1 tablet by mouth 2 (two) times daily as needed. Dettinger, Lucio Sabin, MD Taking Active   HYDROcodone -acetaminophen  [facility]) 10-325 MG tablet [id number]  Take 1 tablet by mouth 2 (two) times daily as needed. Dettinger, Lucio Sabin, MD  Active   insulin  aspart (NOVOLOG  FLEXPEN) 100 UNIT/ML FlexPen [id number] No Inject 5-10 Units into the skin 3 (three) times daily with meals. Dettinger, Lucio Sabin, MD Taking Active   insulin  degludec (TRESIBA  FLEXTOUCH) 200 UNIT/ML FlexTouch Pen [id number] No Inject 76 Units into the skin daily. Dettinger, Lucio Sabin, MD Taking Active   metFORMIN  (GLUCOPHAGE ) 1000 MG tablet [id number] No Take 1 tablet (1,000 mg total) by mouth 2 (two) times daily. Dettinger, Lucio Sabin, MD Taking Active   metoprolol  succinate (TOPROL -XL) 25 MG 24 hr tablet [id number]  Take 1 tablet (25 mg total) by mouth daily. Patient needs an appointment, 2nd attempt Gerard Knight, MD  Active   Semaglutide , 2 MG/DOSE, 8 MG/3ML SOPN [id number] No Inject 2 mg as directed once a week. Dettinger, Lucio Sabin, MD Taking Active              Assessment/Plan:   Diabetes: - Currently uncontrolled--patient needs CGM reordered from parach[facility] - Reviewed long term cardiovascular and renal outcomes of uncontrolled blood sugar - Reviewed goal A1c, goal fasting, and goal 2 hour post prandial glucose - Reviewed dietary modifications including  FOLLOWING A HEART HEALTHY DIET/HEALTHY PLATE METHOD - Reviewed lifestyle modifications including: increased physical activity as able - Recommend to continue all medications as prescribed  - Patient denies personal or family history of multiple endocrine neoplasia type 2, medullary thyroid  cancer; personal history of pancreatitis or gallbladder disease.   Follow Up Plan: 1 month  Marvell Slider, PharmD, BCACP, CPP Clinical Pharmacist, [facility]

## 2024-02-29 NOTE — Telephone Encounter (Signed)
 NEW LIBRE 3 PLUS ORDER SUBMITTED TO [facility] (MAY HAVE TO GO THROUGH [facility] VENDOR SYNAPSE-->THEN FULFILLED BY [facility]S) Patient requested delivery to parent's address [address] [place] Patient knows to answer ADS phone call or call

## 2024-03-03 ENCOUNTER — Telehealth: Payer: Self-pay

## 2024-03-03 ENCOUNTER — Other Ambulatory Visit: Payer: Self-pay | Admitting: Family Medicine

## 2024-03-03 DIAGNOSIS — Z89512 Acquired absence of left leg below knee: Secondary | ICD-10-CM

## 2024-03-03 DIAGNOSIS — Z0289 Encounter for other administrative examinations: Secondary | ICD-10-CM

## 2024-03-03 DIAGNOSIS — M51369 Other intervertebral disc degeneration, lumbar region without mention of lumbar back pain or lower extremity pain: Secondary | ICD-10-CM

## 2024-03-03 MED ORDER — HYDROCODONE-ACETAMINOPHEN 10-325 MG PO TABS: 1.0000 | ORAL_TABLET | Freq: Two times a day (BID) | ORAL | 0 refills | Status: DC | PRN

## 2024-03-03 NOTE — Progress Notes (Signed)
 For some reason it does look like it messed up on the dates, I sent 1 for April now.

## 2024-03-03 NOTE — Telephone Encounter (Signed)
 Copied from CRM [id number]. Topic: Clinical - Medication Question >> Mar 03, 2024  8:22 AM Allyne Areola wrote: Reason for CRM: [facility] because on [facility]/28/2024 they received a prescription for HYDROcodone -acetaminophen  [facility]) 10-325, they also received one for May but they did not received one for April. They would need a prescription for April in order to fill for the patient.

## 2024-03-04 ENCOUNTER — Other Ambulatory Visit: Payer: Self-pay | Admitting: Cardiology

## 2024-03-04 ENCOUNTER — Telehealth: Payer: Self-pay

## 2024-03-04 NOTE — Telephone Encounter (Signed)
 Spoke with pt. He confirms that he is receiving pt assistance with Julie but it is not through [facility]. Informed pt that it was up to him whether he contacted them or not. Pt understood and will call back if needed.

## 2024-03-04 NOTE — Telephone Encounter (Signed)
 Copied from CRM [id number]. Topic: General - Other >> Mar 04, 2024  9:22 AM Emylou G wrote: Reason for CRM: [facility] calling patient about medication?  Is he suppose to call them back to cancel?  He is using someone else he said? His number on file is good.. Or do you need to cancel.

## 2024-03-07 ENCOUNTER — Other Ambulatory Visit (HOSPITAL_COMMUNITY): Payer: Self-pay

## 2024-03-07 ENCOUNTER — Telehealth: Payer: Self-pay | Admitting: Cardiology

## 2024-03-07 MED ORDER — METOPROLOL SUCCINATE ER 25 MG PO TB24
25.0000 mg | ORAL_TABLET | Freq: Every day | ORAL | 0 refills | Status: DC
  Filled 2024-03-07: qty 90, 90d supply, fill #0

## 2024-03-07 NOTE — Telephone Encounter (Signed)
 Pt c/o medication issue:  1. Name of Medication:   metoprolol  succinate (TOPROL -XL) 25 MG 24 hr tablet    2. How are you currently taking this medication (dosage and times per day)? As written  3. Are you having a reaction (difficulty breathing--STAT)? No   4. What is your medication issue? Pt is upset  because he only got 7 days for a refill. He said that is not going to work.

## 2024-03-07 NOTE — Telephone Encounter (Signed)
 Filled for 90 days until appointment.  Patient must show up to visit for further refills.

## 2024-04-11 ENCOUNTER — Other Ambulatory Visit: Payer: Self-pay | Admitting: Family Medicine

## 2024-04-11 ENCOUNTER — Other Ambulatory Visit: Payer: Self-pay | Admitting: Cardiology

## 2024-04-11 DIAGNOSIS — Z89512 Acquired absence of left leg below knee: Secondary | ICD-10-CM

## 2024-04-11 DIAGNOSIS — Z0289 Encounter for other administrative examinations: Secondary | ICD-10-CM

## 2024-04-11 DIAGNOSIS — M51369 Other intervertebral disc degeneration, lumbar region without mention of lumbar back pain or lower extremity pain: Secondary | ICD-10-CM

## 2024-04-11 NOTE — Telephone Encounter (Signed)
 Last Fill: 03/28/24 60 tabs/0 RF  Last OV: 01/28/24 Next OV: 05/07/24  Routing to provider for review/authorization.

## 2024-04-11 NOTE — Telephone Encounter (Signed)
 Copied from CRM [id number]. Topic: Clinical - Medication Refill >> Apr 11, 2024  2:28 PM Turkey B wrote: Medication: HYDROcodone -acetaminophen  (NORCO) 10-325 MG tablet  Has the patient contacted their pharmacy? No because of type of med   This is the patient's preferred pharmacy:  [id number] [place] [place] - [address] [place] [address] Phone: [phone] Fax: [phone]    Is this the correct pharmacy for this prescription? yes  Has the prescription been filled recently? no  Is the patient out of the medication? yes  Has the patient been seen for an appointment in the last year OR does the patient have an upcoming appointment? yes  Can we respond through MyChart? yes  Agent: Please be advised that Rx refills may take up to 3 business days. We ask that you follow-up with your pharmacy.

## 2024-04-11 NOTE — Telephone Encounter (Signed)
 [facility] are getting [facility] ready, pt aware.

## 2024-04-11 NOTE — Telephone Encounter (Signed)
 I do not know why he would be asking for this because on March 24 I sent in prescription for March 24 and April 24 and May 24, so he should have just had 1 that he refilled within a couple weeks ago?

## 2024-04-29 ENCOUNTER — Ambulatory Visit: Payer: Self-pay

## 2024-04-29 NOTE — Telephone Encounter (Signed)
 FYI Only or Action Required?: Action required by provider: medication refill request.  Patient was last seen in primary care on 01/28/2024 by Dettinger, Fonda LABOR, MD. Called Nurse Triage reporting Advice Only. Symptoms began N/A. Interventions attempted: Nothing. Symptoms are: N/A.  Triage Disposition: Call PCP When Office is Open  Patient/caregiver understands and will follow disposition?: Yes                                  Copied: Patient is calling because he needs samples of apixaban  (ELIQUIS ) 5 MG TABS tablet. Please contact the patient at [phone]  Reason for Disposition  [1] Caller requesting NON-URGENT health information AND [2] PCP's office is the best resource  Answer Assessment - Initial Assessment Questions 1. REASON FOR CALL or QUESTION: What is your reason for calling today? or How can I best help you? or What question do you have that I can help answer?     Patient is requesting samples of Eliquis . This medication is on the patient's MAR. This RN clarified if he was requesting a refill of Eliquis . Patient declined and stated he just needed samples. Patient did state Mliss is typically the person who handles his medications. Patient denied symptoms at this time.  Protocols used: Information Only Call - No Triage-A-AH

## 2024-04-30 MED ORDER — APIXABAN 2.5 MG PO TABS: 5.0000 mg | ORAL_TABLET | Freq: Two times a day (BID) | ORAL | Status: DC

## 2024-04-30 NOTE — Telephone Encounter (Signed)
 We have 2.5mg  Eliquis  in the office. Pt will need to double. Pt made aware and will come by and pick up.

## 2024-04-30 NOTE — Addendum Note (Signed)
 Addended by: LEIGH ROSINA SAILOR on: 04/30/2024 02:23 PM   Modules accepted: Orders

## 2024-05-02 ENCOUNTER — Telehealth: Payer: Self-pay | Admitting: Pharmacy Technician

## 2024-05-02 NOTE — Progress Notes (Signed)
   05/02/2024 Name: [name] MRN: [id number] DOB: [birth year]  Patient is appearing on a report for [employer] [place] Metric Diabetes and last engaged with the clinical pharmacist to discuss diabetes on 02/29/24. Contacted patient today to discuss diabetes management and completed medication review.   Diabetes Plan from last clinical pharmacist appointment:  Diabetes: - Currently uncontrolled--patient needs CGM reordered from parach[facility] - Reviewed long term cardiovascular and renal outcomes of uncontrolled blood sugar - Reviewed goal A1c, goal fasting, and goal 2 hour post prandial glucose - Reviewed dietary modifications including FOLLOWING A HEART HEALTHY DIET/HEALTHY PLATE METHOD - Reviewed lifestyle modifications including: increased physical activity as able - Recommend to continue all medications as prescribed  - Patient denies personal or family history of multiple endocrine neoplasia type 2, medullary thyroid  cancer; personal history of pancreatitis or gallbladder disease.     Follow Up Plan: 1 month     Medication Adherence Barriers Identified:  Patient made recommended medication changes per plan: Yes  Access issues with any new medication or testing device: Yes Patient informs he has not received any Libre sensors lately. He informs he received an email from [employer] that he needs to look at and respond to. He requested PharmD is made aware as well. Patient portion of patient assistance completed: Yes Patient is approved for Tresiba , Novolog  and Ozempic  thru Novo Nordisk patient assistance application thru 11/05/24. Patient is checking blood sugars as prescribed: Yes He uses the Freestyle Libre CGM for testing blood sugar. He informs in the mornings before breakfast his blood sugars have been ranging between 120-125 and after he eats they go up to 180-200 but will come back down.   Medication Adherence Barriers Addressed/Actions Taken:  Reviewed medication changes  per plan from last clinical pharmacist note Medication Access for Tresiba , Novolog  and Ozempic  ed patient to contact pharmacy regarding new prescriptions  Patient Assistance Program approved  Reviewed instructions for monitoring blood sugars at home and reminded patient to keep a written log to review with pharmacist Reminded patient of date/time of upcoming clinical pharmacist follow up and any upcoming PCP/specialists visits. Patient denies transportation barriers to the appointment. Yes  Next clinical pharmacist appointment is scheduled for: TBD , PCP appointment on  05/07/24  [name], CPhT [facility] Office: [phone] Email: [name]

## 2024-05-07 ENCOUNTER — Ambulatory Visit: Admitting: Family Medicine

## 2024-05-07 ENCOUNTER — Encounter: Payer: Self-pay | Admitting: Family Medicine

## 2024-05-07 VITALS — BP 138/81 | HR 95 | Ht 74.0 in | Wt 201.0 lb

## 2024-05-07 DIAGNOSIS — I4811 Longstanding persistent atrial fibrillation: Secondary | ICD-10-CM

## 2024-05-07 DIAGNOSIS — E1122 Type 2 diabetes mellitus with diabetic chronic kidney disease: Secondary | ICD-10-CM | POA: Diagnosis not present

## 2024-05-07 DIAGNOSIS — Z89512 Acquired absence of left leg below knee: Secondary | ICD-10-CM

## 2024-05-07 DIAGNOSIS — E785 Hyperlipidemia, unspecified: Secondary | ICD-10-CM

## 2024-05-07 DIAGNOSIS — Z0289 Encounter for other administrative examinations: Secondary | ICD-10-CM

## 2024-05-07 DIAGNOSIS — N181 Chronic kidney disease, stage 1: Secondary | ICD-10-CM | POA: Diagnosis not present

## 2024-05-07 DIAGNOSIS — Z794 Long term (current) use of insulin: Secondary | ICD-10-CM

## 2024-05-07 DIAGNOSIS — M51369 Other intervertebral disc degeneration, lumbar region without mention of lumbar back pain or lower extremity pain: Secondary | ICD-10-CM

## 2024-05-07 DIAGNOSIS — E1142 Type 2 diabetes mellitus with diabetic polyneuropathy: Secondary | ICD-10-CM

## 2024-05-07 DIAGNOSIS — I10 Essential (primary) hypertension: Secondary | ICD-10-CM | POA: Diagnosis not present

## 2024-05-07 LAB — BAYER DCA HB A1C WAIVED: HB A1C (BAYER DCA - WAIVED): 7.7 % — ABNORMAL HIGH (ref 4.8–5.6)

## 2024-05-07 MED ORDER — HYDROCODONE-ACETAMINOPHEN 10-325 MG PO TABS: 1.0000 | ORAL_TABLET | Freq: Two times a day (BID) | ORAL | 0 refills | Status: DC | PRN

## 2024-05-07 MED ORDER — HYDROCODONE-ACETAMINOPHEN 10-325 MG PO TABS
1.0000 | ORAL_TABLET | Freq: Two times a day (BID) | ORAL | 0 refills | Status: DC | PRN
Start: 2024-06-06 — End: 2024-08-06

## 2024-05-07 MED ORDER — METOPROLOL SUCCINATE ER 25 MG PO TB24: 25.0000 mg | ORAL_TABLET | Freq: Every day | ORAL | 0 refills | Status: DC

## 2024-05-07 NOTE — Progress Notes (Addendum)
 BP 138/81   Pulse 95   Ht 6' 2 (1.88 m)   Wt 201 lb (91.2 kg)   SpO2 95%   BMI 25.81 kg/m    Subjective:   Patient ID: [name], male    DOB: 06/16/1966, 58 y.o.   MRN: [id number]  HPI: [name] is a 58 y.o. male presenting on 05/07/2024 for Medical Management of Chronic Issues and Diabetes   HPI Hypertension and A-fib recheck Patient is currently on metoprolol  and Eliquis , and their blood pressure today is 138/81. Patient denies any lightheadedness or dizziness. Patient denies headaches, blurred vision, chest pains, shortness of breath, or weakness. Denies any side effects from medication and is content with current medication.   Type 2 diabetes mellitus Patient comes in today for recheck of his diabetes. Patient has been currently taking NovoLog  and Tresiba  and metformin . Patient is not currently on an ACE inhibitor/ARB. Patient has seen an ophthalmologist this year.  Patient has bilateral amputee BKA, denies any issues with his knees. The symptom started onset as an adult hypertension and A-fib and hyperlipidemia and history of bilateral BKA ARE RELATED TO DM   Hyperlipidemia Patient is coming in for recheck of his hyperlipidemia. The patient is currently taking atorvastatin . They deny any issues with myalgias or history of liver damage from it. They deny any focal numbness or weakness or chest pain.   Pain assessment: Cause of pain-degenerative disc disease of lumbar and pain in his legs from BKA Pain location-lower back and legs Pain on scale of 1-10- 3 Frequency-Daily What increases pain-being on his feet or walking more. What makes pain Better-medication and rest Effects on ADL -minimal Any change in general medical condition-none Current opioids rx-hydrocodone  10-3 25 twice daily as needed # meds rx-60/month Effectiveness of current meds-works well Adverse reactions from pain meds-none Morphine  equivalent-20 Pill count performed-No Last drug screen  -02/18/2024 ( high risk q69m, moderate risk q72m, low risk yearly ) Urine drug screen today- No Was the NCCSR reviewed-yes  If yes were their any concerning findings? -None Pain contract signed on: 02/18/2024  Relevant past medical, surgical, family and social history reviewed and updated as indicated. Interim medical history since our last visit reviewed. Allergies and medications reviewed and updated.  Review of Systems  Constitutional:  Negative for chills and fever.  Eyes:  Negative for visual disturbance.  Respiratory:  Negative for shortness of breath and wheezing.   Cardiovascular:  Negative for chest pain and leg swelling.  Musculoskeletal:  Positive for arthralgias and back pain. Negative for gait problem.  Skin:  Negative for rash.  Neurological:  Negative for dizziness and light-headedness.  All other systems reviewed and are negative.   Per HPI unless specifically indicated above   Allergies as of 05/07/2024   No Known Allergies      Medication List        Accurate as of May 07, 2024 11:59 PM. If you have any questions, ask your nurse or doctor.          apixaban  5 MG Tabs tablet Commonly known as: ELIQUIS  Take 1 tablet (5 mg total) by mouth 2 (two) times daily.   apixaban  2.5 MG Tabs tablet Commonly known as: ELIQUIS  Take 2 tablets (5 mg total) by mouth 2 (two) times daily.   atorvastatin  20 MG tablet Commonly known as: LIPITOR Take 1 tablet (20 mg total) by mouth daily.   DULoxetine  60 MG capsule Commonly known as: CYMBALTA  Take 1 capsule (60  mg total) by mouth at bedtime and may repeat dose one time if needed.   fluticasone  50 MCG/ACT nasal spray Commonly known as: FLONASE  Place 1 spray into both nostrils 2 (two) times daily as needed for allergies or rhinitis.   FreeStyle [place] 2 Reader [place] Use as directed to test blood sugar up to 6 times daily as directed. DX: E 11.9   FreeStyle Libre 2 Sensor Misc Use as directed to test blood sugar up to 6  times daily as directed. DX: E 11.9   gabapentin  600 MG tablet Commonly known as: NEURONTIN  Take 1 tablet (600 mg total) by mouth 3 (three) times daily.   HYDROcodone -acetaminophen  10-325 MG tablet Commonly known as: NORCO Take 1 tablet by mouth 2 (two) times daily as needed. What changed: Another medication with the same name was changed. Make sure you understand how and when to take each. Changed by: Fonda LABOR Kasidee Voisin   HYDROcodone -acetaminophen  10-325 MG tablet Commonly known as: NORCO Take 1 tablet by mouth 2 (two) times daily as needed. Start taking on: June 06, 2024 What changed: These instructions start on June 06, 2024. If you are unsure what to do until then, ask your doctor or other care provider. Changed by: Fonda LABOR Henrique Parekh   HYDROcodone -acetaminophen  10-325 MG tablet Commonly known as: NORCO Take 1 tablet by mouth 2 (two) times daily as needed. Start taking on: July 06, 2024 What changed: These instructions start on July 06, 2024. If you are unsure what to do until then, ask your doctor or other care provider. Changed by: Fonda LABOR Beckett Hickmon   metFORMIN  1000 MG tablet Commonly known as: GLUCOPHAGE  Take 1 tablet (1,000 mg total) by mouth 2 (two) times daily.   metoprolol  succinate 25 MG 24 hr tablet Commonly known as: TOPROL -XL Take 1 tablet (25 mg total) by mouth daily. Patient needs an appointment, 2nd attempt   NovoLOG  FlexPen 100 UNIT/ML FlexPen Generic drug: insulin  aspart Inject 5-10 Units into the skin 3 (three) times daily with meals.   Semaglutide  (2 MG/DOSE) 8 MG/3ML Sopn Inject 2 mg as directed once a week.   Tresiba  FlexTouch 200 UNIT/ML FlexTouch Pen Generic drug: insulin  degludec Inject 76 Units into the skin daily.         Objective:   BP 138/81   Pulse 95   Ht 6' 2 (1.88 m)   Wt 201 lb (91.2 kg)   SpO2 95%   BMI 25.81 kg/m   Wt Readings from Last 3 Encounters:  05/07/24 201 lb (91.2 kg)  02/01/24 200 lb (90.7 kg)   01/28/24 200 lb (90.7 kg)    Physical Exam Vitals and nursing note reviewed.  Constitutional:      General: He is not in acute distress.    Appearance: He is well-developed. He is not diaphoretic.  Eyes:     General: No scleral icterus.    Conjunctiva/sclera: Conjunctivae normal.  Neck:     Thyroid : No thyromegaly.  Cardiovascular:     Rate and Rhythm: Normal rate and regular rhythm.     Heart sounds: Normal heart sounds. No murmur heard. Pulmonary:     Effort: Pulmonary effort is normal. No respiratory distress.     Breath sounds: Normal breath sounds. No wheezing.  Skin:    General: Skin is warm and dry.     Findings: No rash.  Neurological:     Mental Status: He is alert and oriented to person, place, and time.     Coordination: Coordination normal.  Psychiatric:        Behavior: Behavior normal.       Assessment & Plan:   Problem List Items Addressed This Visit       Cardiovascular and Mediastinum   Essential hypertension, benign   Relevant Medications   metoprolol  succinate (TOPROL -XL) 25 MG 24 hr tablet   A-fib (HCC)   Relevant Medications   metoprolol  succinate (TOPROL -XL) 25 MG 24 hr tablet     Endocrine   Neuropathy in diabetes (HCC)   Type 2 diabetes mellitus (HCC) - Primary   Relevant Orders   Bayer DCA Hb A1c Waived (Completed)   CBC with Differential/Platelet (Completed)   CMP14+EGFR (Completed)   Lipid panel (Completed)     Musculoskeletal and Integument   DDD (degenerative disc disease), lumbar   Relevant Medications   HYDROcodone -acetaminophen  (NORCO) 10-325 MG tablet (Start on 06/06/2024)   HYDROcodone -acetaminophen  (NORCO) 10-325 MG tablet (Start on 07/06/2024)   HYDROcodone -acetaminophen  (NORCO) 10-325 MG tablet     Other   Dyslipidemia   Pain management contract signed   Relevant Medications   HYDROcodone -acetaminophen  (NORCO) 10-325 MG tablet (Start on 06/06/2024)   HYDROcodone -acetaminophen  (NORCO) 10-325 MG tablet (Start on  07/06/2024)   HYDROcodone -acetaminophen  (NORCO) 10-325 MG tablet   S/P BKA (below knee amputation) unilateral, left (HCC)   Relevant Medications   HYDROcodone -acetaminophen  (NORCO) 10-325 MG tablet (Start on 06/06/2024)   HYDROcodone -acetaminophen  (NORCO) 10-325 MG tablet (Start on 07/06/2024)   HYDROcodone -acetaminophen  (NORCO) 10-325 MG tablet    A1c is improved at 7.7, will refill current medicine, continue to focus on diet and monitoring and management.  Looks like he had 1 hypoglycemic episode and then yesterday had some hyperglycemia in the afternoon after eating.  Smaller more frequent meals is better than 1 large meal Follow up plan: Return in about 3 months (around 08/07/2024), or if symptoms worsen or fail to improve, for Diabetes.  Counseling provided for all of the vaccine components Orders Placed This Encounter  Procedures   Bayer DCA Hb A1c Waived   CBC with Differential/Platelet   CMP14+EGFR   Lipid panel    Fonda Levins, MD [facility] 05/23/2024, 3:18 PM

## 2024-05-08 LAB — CMP14+EGFR
ALT: 54 IU/L — ABNORMAL HIGH (ref 0–44)
AST: 24 IU/L (ref 0–40)
Albumin: 4.1 g/dL (ref 3.8–4.9)
Alkaline Phosphatase: 191 IU/L — ABNORMAL HIGH (ref 44–121)
BUN/Creatinine Ratio: 12 (ref 9–20)
BUN: 12 mg/dL (ref 6–24)
Bilirubin Total: 0.7 mg/dL (ref 0.0–1.2)
CO2: 26 mmol/L (ref 20–29)
Calcium: 9 mg/dL (ref 8.7–10.2)
Chloride: 99 mmol/L (ref 96–106)
Creatinine, Ser: 0.99 mg/dL (ref 0.76–1.27)
Globulin, Total: 2.4 g/dL (ref 1.5–4.5)
Glucose: 149 mg/dL — ABNORMAL HIGH (ref 70–99)
Potassium: 4.5 mmol/L (ref 3.5–5.2)
Sodium: 140 mmol/L (ref 134–144)
Total Protein: 6.5 g/dL (ref 6.0–8.5)
eGFR: 88 mL/min/{1.73_m2} (ref >59)

## 2024-05-08 LAB — CBC WITH DIFFERENTIAL/PLATELET
Basophils Absolute: 0 10*3/uL (ref 0.0–0.2)
Basos: 1 %
EOS (ABSOLUTE): 0 10*3/uL (ref 0.0–0.4)
Eos: 1 %
Hematocrit: 49 % (ref 37.5–51.0)
Hemoglobin: 16.6 g/dL (ref 13.0–17.7)
Immature Grans (Abs): 0 10*3/uL (ref 0.0–0.1)
Immature Granulocytes: 0 %
Lymphocytes Absolute: 1.6 10*3/uL (ref 0.7–3.1)
Lymphs: 25 %
MCH: 31.9 pg (ref 26.6–33.0)
MCHC: 33.9 g/dL (ref 31.5–35.7)
MCV: 94 fL (ref 79–97)
Monocytes Absolute: 0.5 10*3/uL (ref 0.1–0.9)
Monocytes: 7 %
Neutrophils Absolute: 4.4 10*3/uL (ref 1.4–7.0)
Neutrophils: 65 %
Platelets: 187 10*3/uL (ref 150–450)
RBC: 5.21 x10E6/uL (ref 4.14–5.80)
RDW: 12.8 % (ref 11.6–15.4)
WBC: 6.6 10*3/uL (ref 3.4–10.8)

## 2024-05-08 LAB — LIPID PANEL
Chol/HDL Ratio: 3.9 ratio (ref 0.0–5.0)
Cholesterol, Total: 98 mg/dL — ABNORMAL LOW (ref 100–199)
HDL: 25 mg/dL — ABNORMAL LOW (ref >39)
LDL Chol Calc (NIH): 36 mg/dL (ref 0–99)
Triglycerides: 240 mg/dL — ABNORMAL HIGH (ref 0–149)
VLDL Cholesterol Cal: 37 mg/dL (ref 5–40)

## 2024-05-12 ENCOUNTER — Ambulatory Visit: Payer: Self-pay | Admitting: Family Medicine

## 2024-05-19 ENCOUNTER — Ambulatory Visit: Admitting: Nurse Practitioner

## 2024-05-27 ENCOUNTER — Other Ambulatory Visit: Payer: Self-pay | Admitting: Family Medicine

## 2024-05-27 DIAGNOSIS — Z794 Long term (current) use of insulin: Secondary | ICD-10-CM

## 2024-05-27 DIAGNOSIS — E1122 Type 2 diabetes mellitus with diabetic chronic kidney disease: Secondary | ICD-10-CM

## 2024-05-29 ENCOUNTER — Telehealth (INDEPENDENT_AMBULATORY_CARE_PROVIDER_SITE_OTHER): Admitting: Family Medicine

## 2024-05-29 ENCOUNTER — Encounter: Payer: Self-pay | Admitting: Family Medicine

## 2024-05-29 DIAGNOSIS — L739 Follicular disorder, unspecified: Secondary | ICD-10-CM | POA: Diagnosis not present

## 2024-05-29 DIAGNOSIS — L731 Pseudofolliculitis barbae: Secondary | ICD-10-CM

## 2024-05-29 MED ORDER — SULFAMETHOXAZOLE-TRIMETHOPRIM 800-160 MG PO TABS: 1.0000 | ORAL_TABLET | Freq: Two times a day (BID) | ORAL | 0 refills | Status: DC

## 2024-05-29 NOTE — Progress Notes (Signed)
 Virtual Visit via MyChart video note  I connected with [name] on 05/29/24 at [phone] by video and verified that I am speaking with the correct person using two identifiers. [name] is currently located at home and patient are currently with her during visit. The provider, Fonda LABOR Makayah Pauli, MD is located in their office at time of visit.  Call ended at [phone]  I discussed the limitations, risks, security and privacy concerns of performing an evaluation and management service by video and the availability of in person appointments. I also discussed with the patient that there may be a patient responsible charge related to this service. The patient expressed understanding and agreed to proceed.   History and Present Illness: Patient had spots come up under arm on right axilla.  He has been using antifungal and antibiotic cream.  He denies any drainage, they are painful.  He denies fevers or chills. He has diabetes and is trying to keep it under control    1. Ingrown hair   2. Folliculitis     Outpatient Encounter Medications as of 05/29/2024  Medication Sig   sulfamethoxazole -trimethoprim  (BACTRIM  DS) 800-160 MG tablet Take 1 tablet by mouth 2 (two) times daily.   apixaban  (ELIQUIS ) 2.5 MG TABS tablet Take 2 tablets (5 mg total) by mouth 2 (two) times daily.   apixaban  (ELIQUIS ) 5 MG TABS tablet Take 1 tablet (5 mg total) by mouth 2 (two) times daily.   atorvastatin  (LIPITOR) 20 MG tablet Take 1 tablet (20 mg total) by mouth daily.   Continuous Blood Gluc Receiver (FREESTYLE LIBRE 2 READER) DEVI Use as directed to test blood sugar up to 6 times daily as directed. DX: E 11.9   Continuous Blood Gluc Sensor (FREESTYLE LIBRE 2 SENSOR) MISC Use as directed to test blood sugar up to 6 times daily as directed. DX: E 11.9   DULoxetine  (CYMBALTA ) 60 MG capsule Take 1 capsule (60 mg total) by mouth at bedtime and may repeat dose one time if needed.   fluticasone  (FLONASE ) 50 MCG/ACT nasal  spray Place 1 spray into both nostrils 2 (two) times daily as needed for allergies or rhinitis.   gabapentin  (NEURONTIN ) 600 MG tablet Take 1 tablet (600 mg total) by mouth 3 (three) times daily.   [START ON 06/06/2024] HYDROcodone -acetaminophen  (NORCO) 10-325 MG tablet Take 1 tablet by mouth 2 (two) times daily as needed.   [START ON 07/06/2024] HYDROcodone -acetaminophen  (NORCO) 10-325 MG tablet Take 1 tablet by mouth 2 (two) times daily as needed.   HYDROcodone -acetaminophen  (NORCO) 10-325 MG tablet Take 1 tablet by mouth 2 (two) times daily as needed.   insulin  aspart (NOVOLOG  FLEXPEN) 100 UNIT/ML FlexPen Inject 5-10 Units into the skin 3 (three) times daily with meals.   insulin  degludec (TRESIBA  FLEXTOUCH) 200 UNIT/ML FlexTouch Pen Inject 76 Units into the skin daily.   metFORMIN  (GLUCOPHAGE ) 1000 MG tablet TAKE 1 TABLET BY MOUTH TWICE A DAY   metoprolol  succinate (TOPROL -XL) 25 MG 24 hr tablet Take 1 tablet (25 mg total) by mouth daily. Patient needs an appointment, 2nd attempt   Semaglutide , 2 MG/DOSE, 8 MG/3ML SOPN Inject 2 mg as directed once a week.   No facility-administered encounter medications on file as of 05/29/2024.    Review of Systems  Constitutional:  Negative for chills and fever.  Eyes:  Negative for visual disturbance.  Respiratory:  Negative for shortness of breath and wheezing.   Cardiovascular:  Negative for chest pain and leg swelling.  Skin:  Positive for color change and rash.  All other systems reviewed and are negative.   Observations/Objective: Patient sounds comfortable and in no acute distress  Assessment and Plan: Problem List Items Addressed This Visit   None Visit Diagnoses       Ingrown hair    -  Primary   Relevant Medications   sulfamethoxazole -trimethoprim  (BACTRIM  DS) 800-160 MG tablet     Folliculitis           Will treat with folliculitis  Follow up plan: Return if symptoms worsen or fail to improve.     I discussed the assessment  and treatment plan with the patient. The patient was provided an opportunity to ask questions and all were answered. The patient agreed with the plan and demonstrated an understanding of the instructions.   The patient was advised to call back or seek an in-person evaluation if the symptoms worsen or if the condition fails to improve as anticipated.  The above assessment and management plan was discussed with the patient. The patient verbalized understanding of and has agreed to the management plan. Patient is aware to call the clinic if symptoms persist or worsen. Patient is aware when to return to the clinic for a follow-up visit. Patient educated on when it is appropriate to go to the emergency department.    I provided 8 minutes of non-face-to-face time during this encounter.    Fonda DELENA Levins, MD

## 2024-06-09 NOTE — Progress Notes (Unsigned)
 06/10/2024 Name: [name] MRN: [id number] DOB: January 15, 1966  Chief Complaint  Patient presents with   Diabetes    [name] is a 58 y.o. year old male who presented for a telephone visit.  I connected with  [name] on 06/10/24 by telephone and verified that I am speaking with the correct person using two identifiers. I discussed the limitations of evaluation and management by telemedicine. The patient expressed understanding and agreed to proceed.  Patient was located in her home and PharmD in PCP office during this visit.   They were referred to the pharmacist by their PCP for assistance in managing diabetes.   Subjective:  Patient reports he is now taking medications as prescribed.  He would like get his herlene filled again from [facility].  He admits to snacking some during the day, but tries to adhere to diet.  He is using libre 3 samples and reports his numbers are mostly in the green.  He also drinks a lot of water during the day   Care Team: Primary Care Provider: Dettinger, Fonda LABOR, MD ; Next Scheduled Visit: 08/08/2024 Cardiologist: Almarie Crate ; Next Scheduled Visit: 07/10/2024  Medication Access/Adherence  Current Pharmacy:  [id number] [place] [place] - [address] [place] [address] Phone: [phone] Fax: [phone]  [facility] - [place], [place] - [address] [place] [address] Phone: [phone] Fax: [phone]  [email or web address] - [place], [place] - [address] [facility] [address] [place] [address] Phone: [phone] Fax: [phone]  [facility] - [place], [place] - [address] [place] [address] Phone: [phone] Fax: [phone]  Patient reports affordability concerns with their medications: Yes  Patient reports access/transportation concerns to their pharmacy: No  Patient  reports adherence concerns with their medications:  Yes  due to cost  Diabetes:  Current medications: Tresiba  76 units SQ daily, Novolog  5-10 units SQ three times daily-->reports mostly 15-26 units at meals, 200 20-26 units, 150-200 15 units), Ozempic  2 mg SQ weekly (restarted in 2025), Metformin  1,000 mg PO twice daily Statin: atorvastatin  20 mg daily  Medications tried in the past: previous on Ozempic  in 2021, but stopped due to cost in 2023  A1c 7.7% 05/07/24, decreased from 8% on 01/28/24  Current glucose readings: denies hypoglycemia; reports mostly green  Using Freestyle Libre CGM - prescribed Libre 3 PLUS CGM sensor to [facility] via [facility] portal   Patient denies hypoglycemic s/sx including dizziness, shakiness, sweating. Patient denies hyperglycemic symptoms including polyuria, polydipsia, polyphagia, nocturia, neuropathy, blurred vision.  Current meal patterns:  Trying to eat better, more steady meals throughout the day - Drinks water, IV hydration sugar free mixes Current physical activity: active working in the yard; limited due to amputations  Current medication access support: Novo Nordisk PAP for Ozempic , Tresiba , Novolog  Libre via [facility]   Objective:  Lab Results  Component Value Date   HGBA1C 7.7 (H) 05/07/2024    Lab Results  Component Value Date   CREATININE 0.99 05/07/2024   BUN 12 05/07/2024   NA 140 05/07/2024   K 4.5 05/07/2024   CL 99 05/07/2024   CO2 26 05/07/2024    Lab Results  Component Value Date   CHOL 98 (L) 05/07/2024   HDL 25 (L) 05/07/2024   LDLCALC 36 05/07/2024   TRIG 240 (H)  05/07/2024   CHOLHDL 3.9 05/07/2024    Medications Reviewed Today     Reviewed by Billee Mliss BIRCH, [facility] (Pharmacist) on 06/10/24 at 1341  Med List Status: <None>   Medication Order Taking? Sig Documenting Provider Last Dose Status Informant  apixaban  (ELIQUIS ) 2.5 MG TABS tablet [id number]  Take 2 tablets (5 mg total) by  mouth 2 (two) times daily. Dettinger, Fonda LABOR, MD  Active   apixaban  (ELIQUIS ) 5 MG TABS tablet [id number]  Take 1 tablet (5 mg total) by mouth 2 (two) times daily. Debera Jayson MATSU, MD  Active Self, Pharmacy Records  atorvastatin  (LIPITOR) 20 MG tablet [id number]  Take 1 tablet (20 mg total) by mouth daily. Dettinger, Fonda LABOR, MD  Active     Discontinued 06/10/24 1053 (Change in therapy)          Med Note TENA, MLISS BIRCH Schaumann Nov 17, 2021 10:56 AM) [facility]     Discontinued 06/10/24 1053 (Change in therapy)          Med Note (PRUITT, JULIE D   Thu Nov 17, 2021 10:56 AM) [facility]  DULoxetine  (CYMBALTA ) 60 MG capsule [id number]  Take 1 capsule (60 mg total) by mouth at bedtime and may repeat dose one time if needed. Dettinger, Fonda LABOR, MD  Active   fluticasone  (FLONASE ) 50 MCG/ACT nasal spray [id number]  Place 1 spray into both nostrils 2 (two) times daily as needed for allergies or rhinitis. Dettinger, Fonda LABOR, MD  Active Self, Pharmacy Records           Med Note (TOUSEY, LAINE M   Mon Mar 26, 2023  3:54 PM) 03/26/23- reports has not needed recently  gabapentin  (NEURONTIN ) 600 MG tablet [id number]  Take 1 tablet (600 mg total) by mouth 3 (three) times daily. Dettinger, Fonda LABOR, MD  Active   HYDROcodone -acetaminophen  [facility]) 10-325 MG tablet [id number]  Take 1 tablet by mouth 2 (two) times daily as needed. Dettinger, Fonda LABOR, MD  Active   HYDROcodone -acetaminophen  [facility]) 10-325 MG tablet [id number]  Take 1 tablet by mouth 2 (two) times daily as needed. Dettinger, Fonda LABOR, MD  Active   HYDROcodone -acetaminophen  [facility]) 10-325 MG tablet [id number]  Take 1 tablet by mouth 2 (two) times daily as needed. Dettinger, Fonda LABOR, MD  Active   insulin  aspart (NOVOLOG  FLEXPEN) 100 UNIT/ML FlexPen [id number]  Inject 5-10 Units into the skin 3 (three) times daily with meals. Dettinger, Fonda LABOR, MD  Active   insulin  degludec (TRESIBA  FLEXTOUCH)  200 UNIT/ML FlexTouch Pen [id number]  Inject 76 Units into the skin daily. Dettinger, Fonda LABOR, MD  Active   metFORMIN  (GLUCOPHAGE ) 1000 MG tablet [id number]  Take 1 tablet (1,000 mg total) by mouth 2 (two) times daily. Dettinger, Fonda LABOR, MD  Active   metoprolol  succinate (TOPROL -XL) 25 MG 24 hr tablet [id number]  Take 1 tablet (25 mg total) by mouth daily. Patient needs an appointment, 2nd attempt Dettinger, Fonda LABOR, MD  Active   Semaglutide , 2 MG/DOSE, 8 MG/3ML SOPN [id number]  Inject 2 mg as directed once a week. Dettinger, Fonda LABOR, MD  Active     Discontinued 06/10/24 1048 (Completed Course)             Assessment/Plan:   Diabetes: - Currently uncontrolled, but improving - Reviewed long term cardiovascular and renal outcomes of uncontrolled blood sugar - Reviewed goal A1c, goal fasting, and goal 2 hour post prandial glucose - Recommend to :  Start libre 3 PLUS CGM--sent to [facility] Continue all other medications as prescribed for now, will adjust insulin  as needed at follow up appt with pharmD  - Patient denies personal or family history of multiple endocrine neoplasia type 2, medullary thyroid  cancer; personal history of pancreatitis or gallbladder disease. - Recommend to check glucose using Libre CGM - Enrolled in Novo PAP for (Tresiba , Novolog  & Ozempic )    Follow Up Plan: 2-4 weeks  Mliss Tarry Griffin, PharmD, BCACP, CPP Clinical Pharmacist, [facility]

## 2024-06-10 ENCOUNTER — Encounter: Payer: Self-pay | Admitting: Pharmacist

## 2024-06-10 ENCOUNTER — Other Ambulatory Visit (INDEPENDENT_AMBULATORY_CARE_PROVIDER_SITE_OTHER)

## 2024-06-10 DIAGNOSIS — Z794 Long term (current) use of insulin: Secondary | ICD-10-CM

## 2024-06-10 DIAGNOSIS — E1140 Type 2 diabetes mellitus with diabetic neuropathy, unspecified: Secondary | ICD-10-CM

## 2024-06-10 DIAGNOSIS — E1122 Type 2 diabetes mellitus with diabetic chronic kidney disease: Secondary | ICD-10-CM

## 2024-06-10 MED ORDER — METFORMIN HCL 1000 MG PO TABS: 1000.0000 mg | ORAL_TABLET | Freq: Two times a day (BID) | ORAL | 3 refills | Status: AC

## 2024-06-11 ENCOUNTER — Telehealth: Payer: Self-pay

## 2024-06-11 NOTE — Telephone Encounter (Signed)
 Called to inform patient that Ozempic , Tresiba , and NovoLog  have arrived in office and are ready for pick up. Patient verbalized understanding.

## 2024-06-20 ENCOUNTER — Encounter: Payer: Self-pay | Admitting: Pharmacist

## 2024-06-26 ENCOUNTER — Telehealth: Payer: Self-pay | Admitting: Pharmacist

## 2024-06-26 DIAGNOSIS — E1122 Type 2 diabetes mellitus with diabetic chronic kidney disease: Secondary | ICD-10-CM

## 2024-06-26 MED ORDER — FREESTYLE LIBRE 3 PLUS SENSOR MISC: 11 refills | Status: DC

## 2024-06-26 MED ORDER — FREESTYLE LIBRE 3 READER DEVI: 1 refills | Status: AC

## 2024-06-26 NOTE — Telephone Encounter (Signed)
   Patient inquiring about Libre 3 PLUS CGM delivery.  I will send the RX for Libre 3 PLUS CGM sensors and reader to the local [facility]) to see if they are able to fulfill.  Patient has filled insulin , therefore he should qualify.  Will continue to follow.  PharmD f/u on 07/01/24   Mliss Tarry Griffin, PharmD, BCACP, CPP Clinical Pharmacist, [facility]

## 2024-06-30 NOTE — Progress Notes (Unsigned)
 07/01/2024 Name: [name] MRN: [id number] DOB: 1966/08/08  Chief Complaint  Patient presents with   Diabetes    [name] is a 58 y.o. year old male who presented for a telephone visit.  I connected with  [name] on 07/01/24 by telephone and verified that I am speaking with the correct person using two identifiers. I discussed the limitations of evaluation and management by telemedicine. The patient expressed understanding and agreed to proceed.  Patient was located in her home and PharmD in PCP office during this visit.   They were referred to the pharmacist by their PCP for assistance in managing diabetes.   Subjective: Patient states that he has yet to [facility], but he is planning on going to the pharmacy tomorrow. Reports his sugars have been lower - 110-130s in the morning. He denies symptoms of hypoglycemia.   He states that he waits 1-2 days in between sensors because he wants to give his arm a break. Counseled patient on importance of switching arms/application site with each sensor. Patient is amenable to rotating sites moving forward to reduce number of days without CGM.   Care Team: Primary Care Provider: Dettinger, Fonda LABOR, MD ; Next Scheduled Visit: 08/08/2024 Cardiologist: Almarie Crate ; Next Scheduled Visit: 07/10/2024  Medication Access/Adherence  Current Pharmacy:  [id number] [place], [place] - [address] [place] [address] Phone: [phone] Fax: [phone]  [facility] - [place], [place] - [address] [place] [address] Phone: [phone] Fax: [phone]  [facility] - [place], [place] - [address] [facility] [address] [place] [address] Phone: [phone] Fax: [phone]  [facility] - [place], [place] - [address] [place] [address] Phone: [phone]  Fax: [phone]  Patient reports affordability concerns with their medications: Yes  Patient reports access/transportation concerns to their pharmacy: No  Patient reports adherence concerns with their medications:  Yes  - due to cost  Diabetes:  Current medications:  Tresiba  76 units SQ daily (72-76 units per day, based on sugars)  Novolog  5-10 units SQ three times daily-->reports mostly 15-26 units at meals  200 = 20-26 units 150-200 = 15 units Ozempic  2 mg SQ weekly (restarted in 2025)  Metformin  1,000 mg PO twice daily  Medications tried in the past: previous on Ozempic  in 2021, but stopped due to cost in 2023 Statin: atorvastatin  20 mg daily  A1c 7.7% 05/07/24, decreased from 8% on 01/28/24  Current glucose readings: FBG 110-130s Libre 3+ CGM, [facility]  Patient denies hypoglycemic s/sx including dizziness, shakiness, sweating.  Patient denies hyperglycemic symptoms including polyuria, polydipsia, polyphagia, nocturia, neuropathy, blurred vision.  Current meal patterns: Trying to eat better, more steady meals throughout the day Drinks water, IV hydration sugar free mixes  Current physical activity: active working in the yard; limited due to amputations  Current medication access support: [employer] Medicare, [employer] PAP for Ozempic , Tresiba , Novolog  Libre via [facility] supply via parachute portal  Objective: Lab Results  Component Value Date   HGBA1C 7.7 (H) 05/07/2024   Lab Results  Component Value Date   CREATININE 0.99 05/07/2024   BUN 12 05/07/2024   NA 140 05/07/2024   K 4.5 05/07/2024   CL 99 05/07/2024   CO2 26 05/07/2024   Lab Results  Component Value Date  CHOL 98 (L) 05/07/2024   HDL 25 (L) 05/07/2024   LDLCALC 36 05/07/2024   TRIG 240 (H) 05/07/2024   CHOLHDL 3.9 05/07/2024   Medications Reviewed Today     Reviewed by Bernette Falling, [facility] (Pharmacist) on 07/01/24 at 1351  Med List Status: <None>   Medication  Order Taking? Sig Documenting Provider Last Dose Status Informant  apixaban  (ELIQUIS ) 2.5 MG TABS tablet [id number]  Take 2 tablets (5 mg total) by mouth 2 (two) times daily. Dettinger, Fonda LABOR, MD  Active   apixaban  (ELIQUIS ) 5 MG TABS tablet [id number]  Take 1 tablet (5 mg total) by mouth 2 (two) times daily. Debera Jayson MATSU, MD  Active Self, Pharmacy Records  atorvastatin  (LIPITOR) 20 MG tablet [id number]  Take 1 tablet (20 mg total) by mouth daily. Dettinger, Fonda LABOR, MD  Active   Continuous Glucose Receiver (FREESTYLE LIBRE 3 READER) DEVI [id number]  Use with compatible Freestyle Libre 3 sensor to monitor glucose continuously. DX: E11.65 Dettinger, Fonda LABOR, MD  Active   Continuous Glucose Sensor (FREESTYLE LIBRE 3 PLUS SENSOR) [place] [id number]  Apply sensor to back of upper arm subcutaneously & change sensor every 15 days. DX. E11.65 Dettinger, Fonda LABOR, MD  Active   DULoxetine  (CYMBALTA ) 60 MG capsule [id number]  Take 1 capsule (60 mg total) by mouth at bedtime and may repeat dose one time if needed. Dettinger, Fonda LABOR, MD  Active   fluticasone  (FLONASE ) 50 MCG/ACT nasal spray [id number]  Place 1 spray into both nostrils 2 (two) times daily as needed for allergies or rhinitis. Dettinger, Fonda LABOR, MD  Active Self, Pharmacy Records           Med Note (TOUSEY, LAINE M   Mon Mar 26, 2023  3:54 PM) 03/26/23- reports has not needed recently  gabapentin  (NEURONTIN ) 600 MG tablet [id number]  Take 1 tablet (600 mg total) by mouth 3 (three) times daily. Dettinger, Fonda LABOR, MD  Active   HYDROcodone -acetaminophen  [facility]) 10-325 MG tablet [id number]  Take 1 tablet by mouth 2 (two) times daily as needed. Dettinger, Fonda LABOR, MD  Active   HYDROcodone -acetaminophen  [facility]) 10-325 MG tablet [id number]  Take 1 tablet by mouth 2 (two) times daily as needed. Dettinger, Fonda LABOR, MD  Active   HYDROcodone -acetaminophen  [facility]) 10-325 MG tablet [id number]  Take 1 tablet by mouth 2 (two) times daily as needed. Dettinger,  Fonda LABOR, MD  Active   insulin  aspart (NOVOLOG  FLEXPEN) 100 UNIT/ML FlexPen [id number] Yes Inject 5-10 Units into the skin 3 (three) times daily with meals. Dettinger, Fonda LABOR, MD  Active   insulin  degludec (TRESIBA  FLEXTOUCH) 200 UNIT/ML FlexTouch Pen [id number] Yes Inject 76 Units into the skin daily. Dettinger, Fonda LABOR, MD  Active   metFORMIN  (GLUCOPHAGE ) 1000 MG tablet [id number] Yes Take 1 tablet (1,000 mg total) by mouth 2 (two) times daily. Dettinger, Fonda LABOR, MD  Active   metoprolol  succinate (TOPROL -XL) 25 MG 24 hr tablet [id number]  Take 1 tablet (25 mg total) by mouth daily. Patient needs an appointment, 2nd attempt Dettinger, Fonda LABOR, MD  Active   Semaglutide , 2 MG/DOSE, 8 MG/3ML SOPN [id number] Yes Inject 2 mg as directed once a week. Dettinger, Fonda LABOR, MD  Active             Assessment/Plan:   Diabetes: - Currently uncontrolled - Reviewed long term cardiovascular and renal outcomes of uncontrolled blood sugar - Reviewed goal A1c, goal fasting, and goal 2 hour post prandial glucose - Recommend to: [employer]  3 PLUS CGM, pick-up from pharmacy Encouraged patient to reach out to PharmD if unable to [facility] Continue current diabetes medications - Patient denies personal or family history of multiple endocrine neoplasia type 2, medullary thyroid  cancer; personal history of pancreatitis or gallbladder disease. - Recommend to check glucose using Libre CGM. Counseled on rotating sensor sites with each sensor and reducing days without a sensor, as able. - Enrolled in Novo PAP for (Tresiba , Novolog  & Ozempic )   Follow Up Plan: 2 months  Woodie Jock, PharmD PGY1 Pharmacy Resident   Mliss Tarry Griffin, PharmD, BCACP, CPP Clinical Pharmacist, [facility]

## 2024-07-01 ENCOUNTER — Other Ambulatory Visit

## 2024-07-01 DIAGNOSIS — E1122 Type 2 diabetes mellitus with diabetic chronic kidney disease: Secondary | ICD-10-CM

## 2024-07-01 DIAGNOSIS — Z794 Long term (current) use of insulin: Secondary | ICD-10-CM

## 2024-07-10 ENCOUNTER — Ambulatory Visit: Admitting: Nurse Practitioner

## 2024-07-19 ENCOUNTER — Other Ambulatory Visit: Payer: Self-pay | Admitting: Family Medicine

## 2024-07-19 DIAGNOSIS — E1142 Type 2 diabetes mellitus with diabetic polyneuropathy: Secondary | ICD-10-CM

## 2024-07-24 ENCOUNTER — Ambulatory Visit: Admitting: Nurse Practitioner

## 2024-08-01 ENCOUNTER — Telehealth: Payer: Self-pay

## 2024-08-01 NOTE — Telephone Encounter (Signed)
 [name]

## 2024-08-08 ENCOUNTER — Encounter: Payer: Self-pay | Admitting: Family Medicine

## 2024-08-08 ENCOUNTER — Ambulatory Visit: Admitting: Family Medicine

## 2024-08-08 VITALS — BP 127/84 | HR 90 | Temp 97.6°F | Ht 74.0 in | Wt 199.2 lb

## 2024-08-08 DIAGNOSIS — E785 Hyperlipidemia, unspecified: Secondary | ICD-10-CM

## 2024-08-08 DIAGNOSIS — E1122 Type 2 diabetes mellitus with diabetic chronic kidney disease: Secondary | ICD-10-CM | POA: Diagnosis not present

## 2024-08-08 DIAGNOSIS — M51369 Other intervertebral disc degeneration, lumbar region without mention of lumbar back pain or lower extremity pain: Secondary | ICD-10-CM

## 2024-08-08 DIAGNOSIS — E1142 Type 2 diabetes mellitus with diabetic polyneuropathy: Secondary | ICD-10-CM

## 2024-08-08 DIAGNOSIS — E1159 Type 2 diabetes mellitus with other circulatory complications: Secondary | ICD-10-CM | POA: Diagnosis not present

## 2024-08-08 DIAGNOSIS — Z794 Long term (current) use of insulin: Secondary | ICD-10-CM

## 2024-08-08 DIAGNOSIS — Z23 Encounter for immunization: Secondary | ICD-10-CM | POA: Diagnosis not present

## 2024-08-08 DIAGNOSIS — I152 Hypertension secondary to endocrine disorders: Secondary | ICD-10-CM

## 2024-08-08 DIAGNOSIS — Z0289 Encounter for other administrative examinations: Secondary | ICD-10-CM

## 2024-08-08 DIAGNOSIS — Z89512 Acquired absence of left leg below knee: Secondary | ICD-10-CM

## 2024-08-08 DIAGNOSIS — Z1211 Encounter for screening for malignant neoplasm of colon: Secondary | ICD-10-CM

## 2024-08-08 LAB — LIPID PANEL

## 2024-08-08 LAB — BAYER DCA HB A1C WAIVED: HB A1C (BAYER DCA - WAIVED): 8.9 % — ABNORMAL HIGH (ref 4.8–5.6)

## 2024-08-08 MED ORDER — HYDROCODONE-ACETAMINOPHEN 10-325 MG PO TABS: 1.0000 | ORAL_TABLET | Freq: Two times a day (BID) | ORAL | 0 refills | Status: DC | PRN

## 2024-08-08 NOTE — Progress Notes (Addendum)
 BP 127/84   Pulse 90   Temp 97.6 F (36.4 C) (Temporal)   Ht 6' 2 (1.88 m)   Wt 199 lb 3.2 oz (90.4 kg)   BMI 25.58 kg/m    Subjective:   Patient ID: [name], male    DOB: 1966/07/24, 58 y.o.   MRN: [id number]  HPI: [name] is a 58 y.o. male presenting on 08/08/2024 for Medical Management of Chronic Issues   Discussed the use of AI scribe software for clinical note transcription with the patient, who gave verbal consent to proceed.  History of Present Illness   [name] is a 58 year old male with type 2 diabetes, hypertension, dyslipidemia, and degenerative disc disease who presents for a chronic medical recheck.  Hyperglycemia - Elevated blood glucose levels with recent hemoglobin A1c of 8.9% - Dietary intake includes high-carbohydrate foods such as Pop Tarts, sandwiches, hamburgers, fries, hush puppies, and hot dogs - Current diabetes regimen: Tresiba  72-76 units daily, Ozempic , metformin , and Novolog  14-16 units with meals - Occasionally omits Novolog  when eating out, contributing to poor glycemic control  Chronic pain and functional status - Chronic back and knee pain secondary to degenerative disc disease and bilateral below-knee amputations - Pain management includes hydrocodone  10/325 mg twice daily as needed, gabapentin , and Cymbalta  - Pain medications provide relief, allowing for continued activity - Fatigue and need to rest after excessive walking - Remains active by walking and mowing grass, which assists with weight maintenance          Relevant past medical, surgical, family and social history reviewed and updated as indicated. Interim medical history since our last visit reviewed. Allergies and medications reviewed and updated.  Review of Systems  Constitutional:  Negative for chills and fever.  Respiratory:  Negative for shortness of breath and wheezing.   Cardiovascular:  Negative for chest pain and leg swelling.  Musculoskeletal:   Positive for arthralgias, back pain and gait problem.  Skin:  Negative for rash.  All other systems reviewed and are negative.   Per HPI unless specifically indicated above   Allergies as of 08/08/2024   No Known Allergies      Medication List        Accurate as of August 08, 2024  1:09 PM. If you have any questions, ask your nurse or doctor.          apixaban  5 MG Tabs tablet Commonly known as: ELIQUIS  Take 1 tablet (5 mg total) by mouth 2 (two) times daily.   apixaban  2.5 MG Tabs tablet Commonly known as: ELIQUIS  Take 2 tablets (5 mg total) by mouth 2 (two) times daily.   atorvastatin  20 MG tablet Commonly known as: LIPITOR Take 1 tablet (20 mg total) by mouth daily.   DULoxetine  60 MG capsule Commonly known as: CYMBALTA  TAKE 1 CAPSULE (60 MG TOTAL) BY MOUTH AT BEDTIME AND MAY REPEAT DOSE ONE TIME IF NEEDED.   fluticasone  50 MCG/ACT nasal spray Commonly known as: FLONASE  Place 1 spray into both nostrils 2 (two) times daily as needed for allergies or rhinitis.   FreeStyle Libre 3 Plus Sensor Misc Apply sensor to back of upper arm subcutaneously & change sensor every 15 days. DX. E11.65   FreeStyle Libre 3 Reader [facility] Use with compatible Freestyle Libre 3 sensor to monitor glucose continuously. DX: E11.65   gabapentin  600 MG tablet Commonly known as: NEURONTIN  Take 1 tablet (600 mg total) by mouth 3 (three) times daily.  HYDROcodone -acetaminophen  10-325 MG tablet Commonly known as: NORCO Take 1 tablet by mouth 2 (two) times daily as needed. What changed: Another medication with the same name was changed. Make sure you understand how and when to take each. Changed by: Fonda LABOR Isak Sotomayor   HYDROcodone -acetaminophen  10-325 MG tablet Commonly known as: NORCO Take 1 tablet by mouth 2 (two) times daily as needed. Start taking on: September 07, 2024 What changed: These instructions start on September 07, 2024. If you are unsure what to do until then, ask your  doctor or other care provider. Changed by: Fonda LABOR Prakriti Carignan   HYDROcodone -acetaminophen  10-325 MG tablet Commonly known as: NORCO Take 1 tablet by mouth 2 (two) times daily as needed. Start taking on: October 07, 2024 What changed: These instructions start on October 07, 2024. If you are unsure what to do until then, ask your doctor or other care provider. Changed by: Fonda LABOR Shaneen Reeser   metFORMIN  1000 MG tablet Commonly known as: GLUCOPHAGE  Take 1 tablet (1,000 mg total) by mouth 2 (two) times daily.   metoprolol  succinate 25 MG 24 hr tablet Commonly known as: TOPROL -XL TAKE 1 TABLET (25 MG TOTAL) BY MOUTH DAILY. PATIENT NEEDS AN APPOINTMENT, 2ND ATTEMPT   NovoLOG  FlexPen 100 UNIT/ML FlexPen Generic drug: insulin  aspart Inject 5-10 Units into the skin 3 (three) times daily with meals.   Semaglutide  (2 MG/DOSE) 8 MG/3ML Sopn Inject 2 mg as directed once a week.   Tresiba  FlexTouch 200 UNIT/ML FlexTouch Pen Generic drug: insulin  degludec Inject 76 Units into the skin daily.         Objective:   BP 127/84   Pulse 90   Temp 97.6 F (36.4 C) (Temporal)   Ht 6' 2 (1.88 m)   Wt 199 lb 3.2 oz (90.4 kg)   BMI 25.58 kg/m   Wt Readings from Last 3 Encounters:  08/08/24 199 lb 3.2 oz (90.4 kg)  05/07/24 201 lb (91.2 kg)  02/01/24 200 lb (90.7 kg)    Physical Exam Physical Exam   CHEST: Lungs clear to auscultation bilaterally. CARDIOVASCULAR: Heart regular rate and rhythm, no murmurs.         Assessment & Plan:   Problem List Items Addressed This Visit       Cardiovascular and Mediastinum   Hypertension associated with diabetes (HCC)     Endocrine   Neuropathy in diabetes (HCC)   Type 2 diabetes mellitus (HCC) - Primary   Relevant Orders   Bayer DCA Hb A1c Waived   CBC with Differential/Platelet   CMP14+EGFR   Lipid panel   Vitamin B12     Musculoskeletal and Integument   DDD (degenerative disc disease), lumbar   Relevant Medications    HYDROcodone -acetaminophen  (NORCO) 10-325 MG tablet (Start on 09/07/2024)   HYDROcodone -acetaminophen  (NORCO) 10-325 MG tablet (Start on 10/07/2024)   HYDROcodone -acetaminophen  (NORCO) 10-325 MG tablet     Other   Dyslipidemia   Pain management contract signed   Relevant Medications   HYDROcodone -acetaminophen  (NORCO) 10-325 MG tablet (Start on 09/07/2024)   HYDROcodone -acetaminophen  (NORCO) 10-325 MG tablet (Start on 10/07/2024)   HYDROcodone -acetaminophen  (NORCO) 10-325 MG tablet   S/P BKA (below knee amputation) unilateral, left (HCC)   Relevant Medications   HYDROcodone -acetaminophen  (NORCO) 10-325 MG tablet (Start on 09/07/2024)   HYDROcodone -acetaminophen  (NORCO) 10-325 MG tablet (Start on 10/07/2024)   HYDROcodone -acetaminophen  (NORCO) 10-325 MG tablet   Other Visit Diagnoses       Encounter for immunization       Relevant Orders  Flu vaccine trivalent PF, 6mos and older(Flulaval,Afluria,Fluarix,Fluzone) (Completed)     Colon cancer screening       Relevant Orders   Fecal occult blood, imunochemical          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 Neuropathy managed with gabapentin  and Cymbalta . - Increase Novolog  dose to 20-22 units with high-carbohydrate meals. - Educated on pre-meal Novolog  administration and dose adjustment based on carbohydrate intake. - Discussed strategies to avoid high-carbohydrate foods or increase insulin  coverage if consumed. - Advised on keeping Novolog  in a cooler when away from home. - Prescribe three-month supply of hydrocodone  for pain management.  Lumbar degenerative disc disease with chronic pain syndrome Chronic pain syndrome secondary to lumbar degenerative disc disease. Adequate pain control with current regimen. - Prescribe three-month supply of hydrocodone  for pain management.  Essential hypertension  Hyperlipidemia      This office visit was a  visit to discuss patient's diabetic management and because he is out of control and using insulin  4 times daily and having to check his blood sugars 4 times daily I believe he is continue with his CGM and it is benefiting him.     Follow up plan: Return in about 3 months (around 11/08/2024), or if symptoms worsen or fail to improve, for Diabetes and pain management.  Counseling provided for all of the vaccine components Orders Placed This Encounter  Procedures   Fecal occult blood, imunochemical   Flu vaccine trivalent PF, 6mos and older(Flulaval,Afluria,Fluarix,Fluzone)   Bayer DCA Hb A1c Waived   CBC with Differential/Platelet   CMP14+EGFR   Lipid panel   Vitamin B12    Fonda Levins, MD [facility] 08/08/2024, 1:09 PM

## 2024-08-09 LAB — LIPID PANEL
Chol/HDL Ratio: 3.1 ratio (ref 0.0–5.0)
Cholesterol, Total: 87 mg/dL — ABNORMAL LOW (ref 100–199)
HDL: 28 mg/dL — ABNORMAL LOW (ref >39)
LDL Chol Calc (NIH): 34 mg/dL (ref 0–99)
Triglycerides: 142 mg/dL (ref 0–149)
VLDL Cholesterol Cal: 25 mg/dL (ref 5–40)

## 2024-08-09 LAB — CBC WITH DIFFERENTIAL/PLATELET
Basophils Absolute: 0 x10E3/uL (ref 0.0–0.2)
Basos: 1 %
EOS (ABSOLUTE): 0 x10E3/uL (ref 0.0–0.4)
Eos: 1 %
Hematocrit: 52.1 % — ABNORMAL HIGH (ref 37.5–51.0)
Hemoglobin: 17.4 g/dL (ref 13.0–17.7)
Immature Grans (Abs): 0 x10E3/uL (ref 0.0–0.1)
Immature Granulocytes: 0 %
Lymphocytes Absolute: 1.7 x10E3/uL (ref 0.7–3.1)
Lymphs: 27 %
MCH: 31.4 pg (ref 26.6–33.0)
MCHC: 33.4 g/dL (ref 31.5–35.7)
MCV: 94 fL (ref 79–97)
Monocytes Absolute: 0.5 x10E3/uL (ref 0.1–0.9)
Monocytes: 7 %
Neutrophils Absolute: 4 x10E3/uL (ref 1.4–7.0)
Neutrophils: 63 %
Platelets: 209 x10E3/uL (ref 150–450)
RBC: 5.55 x10E6/uL (ref 4.14–5.80)
RDW: 12 % (ref 11.6–15.4)
WBC: 6.3 x10E3/uL (ref 3.4–10.8)

## 2024-08-09 LAB — CMP14+EGFR
ALT: 19 IU/L (ref 0–44)
AST: 13 IU/L (ref 0–40)
Albumin: 4.1 g/dL (ref 3.8–4.9)
Alkaline Phosphatase: 155 IU/L — ABNORMAL HIGH (ref 47–123)
BUN/Creatinine Ratio: 18 (ref 9–20)
BUN: 16 mg/dL (ref 6–24)
Bilirubin Total: 0.8 mg/dL (ref 0.0–1.2)
CO2: 26 mmol/L (ref 20–29)
Calcium: 8.8 mg/dL (ref 8.7–10.2)
Chloride: 101 mmol/L (ref 96–106)
Creatinine, Ser: 0.89 mg/dL (ref 0.76–1.27)
Globulin, Total: 2.3 g/dL (ref 1.5–4.5)
Glucose: 197 mg/dL — ABNORMAL HIGH (ref 70–99)
Potassium: 4.6 mmol/L (ref 3.5–5.2)
Sodium: 142 mmol/L (ref 134–144)
Total Protein: 6.4 g/dL (ref 6.0–8.5)
eGFR: 99 mL/min/1.73 (ref >59)

## 2024-08-09 LAB — VITAMIN B12: Vitamin B-12: 288 pg/mL (ref 232–1245)

## 2024-08-14 ENCOUNTER — Ambulatory Visit: Payer: Self-pay | Admitting: Family Medicine

## 2024-08-15 ENCOUNTER — Other Ambulatory Visit: Payer: Self-pay | Admitting: Family Medicine

## 2024-08-15 MED ORDER — METOPROLOL SUCCINATE ER 25 MG PO TB24: 25.0000 mg | ORAL_TABLET | Freq: Every day | ORAL | 0 refills | Status: DC

## 2024-08-15 NOTE — Telephone Encounter (Signed)
 Copied from CRM [id number]. Topic: Clinical - Medication Refill >> Aug 15, 2024 12:45 PM Zebedee SAUN[place] wrote: Medication: metoprolol  succinate (TOPROL -XL) 25 MG 24 hr tablet  Has the patient contacted their pharmacy? Yes (Agent: If no, request that the patient contact the pharmacy for the refill. If patient does not wish to contact the pharmacy document the reason why and proceed with request.) (Agent: If yes, when and what did the pharmacy advise?)  This is the patient's preferred pharmacy:  [id number] - [place], [place] - [address] [place] [address] Phone: [phone] Fax: [phone]  Is this the correct pharmacy for this prescription? Yes If no, delete pharmacy and type the correct one.   Has the prescription been filled recently? Yes  Is the patient out of the medication? Yes  Has the patient been seen for an appointment in the last year OR does the patient have an upcoming appointment? Yes  Can we respond through MyChart? Yes  Agent: Please be advised that Rx refills may take up to 3 business days. We ask that you follow-up with your pharmacy.

## 2024-08-29 ENCOUNTER — Telehealth: Payer: Self-pay | Admitting: Pharmacist

## 2024-08-29 DIAGNOSIS — E1122 Type 2 diabetes mellitus with diabetic chronic kidney disease: Secondary | ICD-10-CM

## 2024-08-29 MED ORDER — FREESTYLE LIBRE 3 PLUS SENSOR MISC: 11 refills | Status: DC

## 2024-08-29 NOTE — Telephone Encounter (Signed)
 Assisted patient with LIS?extra help application He is right at the limit for single income cutoff Informed patient of the discontinuation of Novo Nordisk PAP  Patient to reach out to PharmD once he receives Extra Help outcome   Mliss Tarry Griffin, PharmD, JAQUELINE, CPP Clinical Pharmacist, [facility]

## 2024-08-29 NOTE — Telephone Encounter (Signed)
 Eliquis  samples given to patient 3 boxes/21 day supply LOT [id number] EXP 01/2025  Mliss Tarry Griffin, PharmD, BCACP, CPP Clinical Pharmacist, [facility]

## 2024-09-01 ENCOUNTER — Telehealth: Payer: Self-pay | Admitting: Family Medicine

## 2024-09-01 NOTE — Progress Notes (Signed)
 09/02/2024 Name: [name] MRN: [id number] DOB: 1966-02-25  Chief Complaint  Patient presents with   Diabetes   Medication Access    [name] is a 58 y.o. year old male who presented for a telephone visit. They were referred to the pharmacist by their PCP for assistance in managing diabetes and medication access.    Subjective: [name] presents for a follow-up telephone visit for medication access. He has received 6 sensors from the mail-order pharmacy recently. He was not charged for the prescription. He reports compliance with all other medications.   He states that he spoke with his insurance agent, and he will be staying on [facility] but is pending approval of ExtraHelp to receive prescriptions at a lower cost.   Care Team: Primary Care Provider: Dettinger, Fonda LABOR, MD ; Next Scheduled Visit: 11/11/2023  Medication Access/Adherence Current Pharmacy:  [phone] [place], [place] - [address] [place] [address] Phone: [phone] Fax: [phone]  [facility] - [place], [place] - [address] [place] [address] Phone: [phone] Fax: [phone]  [email or web address] - [place], [place] - [phone] [facility] [address] [facility] [address] [place] [address] Phone: [phone] Fax: [phone]  [facility] - [place], [place] - [address] [place] [address] Phone: [phone] Fax: [phone]   Patient reports affordability concerns with their medications: Yes  Patient reports access/transportation concerns to their pharmacy: No  Patient reports adherence concerns with their medications:  No    Diabetes: Current medications: Tresiba  76 (72-76) units daily, Novolog  TID (sliding scale, 15-20 units), Ozempic  2 mg weekly (Thursday), metformin  1,000 mg BID, Libre 3+ (phone) Medications tried in the past: Ozempic  (2021, but stopped due to  cost) Statin: atorvastatin  20 mg LDL of 34 on 08/08/24 ACEi/ARB: none UACR of 30 on 01/28/24  A1c of 8.9% on 08/08/24, increased from 7.7% on 05/07/24  Patient denies hypoglycemic s/sx including dizziness, shakiness, sweating. Patient denies hyperglycemic symptoms including polyuria, polydipsia, polyphagia, nocturia, neuropathy, blurred vision.  Current meal patterns:  Discussed meal planning options and Plate method for healthy eating Avoid sugary drinks and desserts Incorporate balanced protein, non starchy veggies, 1 serving of carbohydrate with each meal Increase water intake Increase physical activity as able  Current medication access support:  Novo Nordisk (Tresiba , Ozempic ) Pending ExtraHelp approval    Objective: Lab Results  Component Value Date   HGBA1C 8.9 (H) 08/08/2024   Lab Results  Component Value Date   CREATININE 0.89 08/08/2024   BUN 16 08/08/2024   NA 142 08/08/2024   K 4.6 08/08/2024   CL 101 08/08/2024   CO2 26 08/08/2024   Lab Results  Component Value Date   CHOL 87 (L) 08/08/2024   HDL 28 (L) 08/08/2024   LDLCALC 34 08/08/2024   TRIG 142 08/08/2024   CHOLHDL 3.1 08/08/2024   Medications Reviewed Today     Reviewed by Bernette Falling, RPH (Pharmacist) on 09/02/24 at 1340  Med List Status: <None>   Medication Order Taking? Sig Documenting Provider Last Dose Status Informant  apixaban  (ELIQUIS ) 2.5 MG TABS tablet [id number]  Take 2 tablets (5 mg total) by mouth 2 (two) times daily. Dettinger, Fonda LABOR, MD  Active   apixaban  (ELIQUIS ) 5 MG TABS tablet [id number]  Take 1 tablet (5 mg total) by mouth 2 (two) times daily. Debera Jayson MATSU, MD  Active Self, Pharmacy  Records           Med Note TENA MLISS BIRCH   Fri Aug 29, 2024 12:52 PM) LOT [id number] EXP 01/2025   atorvastatin  (LIPITOR) 20 MG tablet [id number] Yes Take 1 tablet (20 mg total) by mouth daily. Dettinger, Fonda LABOR, MD  Active   Continuous Glucose Receiver (FREESTYLE LIBRE 3 READER)  DEVI [id number] Yes Use with compatible Freestyle Libre 3 sensor to monitor glucose continuously. DX: E11.65 Dettinger, Fonda LABOR, MD  Active   Continuous Glucose Sensor (FREESTYLE LIBRE 3 PLUS SENSOR) [place] [id number] Yes Apply sensor to back of upper arm subcutaneously & change sensor every 15 days. DX. E11.65 Dettinger, Fonda LABOR, MD  Active   DULoxetine  (CYMBALTA ) 60 MG capsule [id number]  TAKE 1 CAPSULE (60 MG TOTAL) BY MOUTH AT BEDTIME AND MAY REPEAT DOSE ONE TIME IF NEEDED. Dettinger, Fonda LABOR, MD  Active   fluticasone  (FLONASE ) 50 MCG/ACT nasal spray [id number]  Place 1 spray into both nostrils 2 (two) times daily as needed for allergies or rhinitis. Dettinger, Fonda LABOR, MD  Active Self, Pharmacy Records           Med Note (TOUSEY, LAINE M   Mon Mar 26, 2023  3:54 PM) 03/26/23- reports has not needed recently  gabapentin  (NEURONTIN ) 600 MG tablet [id number]  Take 1 tablet (600 mg total) by mouth 3 (three) times daily. Dettinger, Fonda LABOR, MD  Active   HYDROcodone -acetaminophen  [facility]) 10-325 MG tablet [id number]  Take 1 tablet by mouth 2 (two) times daily as needed. Dettinger, Fonda LABOR, MD  Active   HYDROcodone -acetaminophen  (NORCO) 10-325 MG tablet [id number]  Take 1 tablet by mouth 2 (two) times daily as needed. Dettinger, Fonda LABOR, MD  Active   HYDROcodone -acetaminophen  [facility]) 10-325 MG tablet [id number]  Take 1 tablet by mouth 2 (two) times daily as needed. Dettinger, Fonda LABOR, MD  Active   insulin  aspart (NOVOLOG  FLEXPEN) 100 UNIT/ML FlexPen [id number]  Inject 5-10 Units into the skin 3 (three) times daily with meals. Dettinger, Fonda LABOR, MD  Active   insulin  degludec (TRESIBA  FLEXTOUCH) 200 UNIT/ML FlexTouch Pen [id number]  Inject 76 Units into the skin daily. Dettinger, Fonda LABOR, MD  Active   metFORMIN  (GLUCOPHAGE ) 1000 MG tablet [id number] Yes Take 1 tablet (1,000 mg total) by mouth 2 (two) times daily. Dettinger, Fonda LABOR, MD  Active   metoprolol  succinate (TOPROL -XL) 25 MG 24 hr tablet [id number]   Take 1 tablet (25 mg total) by mouth daily. Patient needs an appointment, 2nd attempt Dettinger, Fonda LABOR, MD  Active   Semaglutide , 2 MG/DOSE, 8 MG/3ML SOPN [id number] Yes Inject 2 mg as directed once a week. Dettinger, Fonda LABOR, MD  Active             Assessment/Plan:  Diabetes: Currently uncontrolled. Goal of <7%. Cardiorenal risk reduction is opportunities for improvement. Blood pressure is not at goal of <130/80 mmHg LDL is at goal of <70 mg/dL Reviewed long term cardiovascular and renal outcomes of uncontrolled blood sugar Reviewed goal A1c, goal fasting, and goal 2 hour post prandial glucose Recommend to:  Continue all diabetes medications as prescribed Pending approval from ExtraHelp, patient to call office when approved. He is awaiting a letter in the mail, has yet to receive.  Will send Libre 3+ sensors back to [facility], patient has received 6 sensors in the mail a week ago.  Recommend to check glucose continuously with Libre 3+ sensor (phone) Patient denies personal or family history of multiple endocrine neoplasia type  2, medullary thyroid  cancer; personal history of pancreatitis or gallbladder disease., Discussed side effects of gastrointestinal upset/nausea; eating smaller meals, avoiding high-fat foods, and remaining upright after eating may reduce nausea. Discussed that overeating is a major trigger of nausea with this class of medications, as often times patients will start to feel full sooner and may need to decrease portion sizes from what they were previously accustomed to.   Follow Up Plan:  PCP 11/10/2024  Woodie Jock, PharmD PGY1 Pharmacy Resident  09/02/2024  Mliss Tarry Griffin, PharmD, BCACP, CPP Clinical Pharmacist, [facility]

## 2024-09-01 NOTE — Telephone Encounter (Signed)
 Patient talked to his insurance agent said the plan he is on is the best plan for him to stay on.

## 2024-09-02 ENCOUNTER — Other Ambulatory Visit

## 2024-09-02 DIAGNOSIS — E1122 Type 2 diabetes mellitus with diabetic chronic kidney disease: Secondary | ICD-10-CM

## 2024-09-02 DIAGNOSIS — Z794 Long term (current) use of insulin: Secondary | ICD-10-CM

## 2024-09-04 ENCOUNTER — Telehealth: Payer: Self-pay

## 2024-09-04 NOTE — Telephone Encounter (Signed)
 Last PAP refill  In process of completing Novo Nordisk refills for patients OZEMPIC  1MG  DOSE PENS medication.  Application with Mliss SQUIBB for signature.

## 2024-09-04 NOTE — Telephone Encounter (Signed)
Faxed refills to novo nordisk.

## 2024-09-09 ENCOUNTER — Telehealth: Payer: Self-pay | Admitting: Pharmacist

## 2024-09-18 NOTE — Telephone Encounter (Signed)
 Patient denied for LIS/extra help  Patient to bring letter in so we can devise plan moving forward Eliquis  samples left up front for patient to pick up Will continue to follow Stable on current regimen  Mahima Hottle Dattero Kynleigh Artz, PharmD, BCACP, CPP Clinical Pharmacist, [facility]

## 2024-09-22 ENCOUNTER — Ambulatory Visit: Admitting: Nurse Practitioner

## 2024-10-23 ENCOUNTER — Telehealth: Payer: Self-pay

## 2024-10-23 ENCOUNTER — Other Ambulatory Visit (HOSPITAL_COMMUNITY): Payer: Self-pay

## 2024-10-23 NOTE — Telephone Encounter (Signed)
 Rec'd LIS denial letter (scanning a copy to patients media).   Will mail Novo Nordisk re-enrollment application to patients home for Tresiba  and Novolog .

## 2024-10-23 NOTE — Telephone Encounter (Signed)
 PAP: Patient assistance application for Novolog  and Tresiba  through Novo Nordisk has been mailed to pt's home address on file.  LIS denial letter scanned to patients media.

## 2024-10-27 ENCOUNTER — Telehealth: Payer: Self-pay

## 2024-10-27 DIAGNOSIS — E119 Type 2 diabetes mellitus without complications: Secondary | ICD-10-CM

## 2024-10-27 NOTE — Telephone Encounter (Signed)
 Copied from CRM #[id number]. Topic: General - Other >> Oct 27, 2024 10:44 AM Thersia BROCKS wrote: Reason for CRM: Patient called in regarding needing to speak with Pharmacist Mliss, would like for a callback patient is considering a new insurance and wants to know what medication is cover with new insurance

## 2024-10-28 ENCOUNTER — Telehealth: Payer: Self-pay | Admitting: Family Medicine

## 2024-10-28 ENCOUNTER — Telehealth: Payer: Self-pay | Admitting: Pharmacist

## 2024-10-28 DIAGNOSIS — E119 Type 2 diabetes mellitus without complications: Secondary | ICD-10-CM

## 2024-10-28 DIAGNOSIS — E1122 Type 2 diabetes mellitus with diabetic chronic kidney disease: Secondary | ICD-10-CM

## 2024-10-28 NOTE — Telephone Encounter (Signed)
 Toujeo  covered Not tresiba  Will call in to see if Toujeo  is $35 at the pharmacy Will f/u with patient in January

## 2024-10-28 NOTE — Telephone Encounter (Signed)
 Reason for CRM: lane from cover my meds is calling to speak with someone who hands prior authorzations. [name] is calling in for [name] and she was only able to verify his dob. [name] is calling to help the office submit the authorization. The authorization is for the  Continuous Glucose Sensor (FREESTYLE LIBRE 3 PLUS SENSOR) MISC . [name] can be reached at  [phone]

## 2024-11-05 ENCOUNTER — Other Ambulatory Visit (HOSPITAL_COMMUNITY): Payer: Self-pay

## 2024-11-05 ENCOUNTER — Telehealth: Payer: Self-pay | Admitting: Pharmacist

## 2024-11-05 DIAGNOSIS — E1122 Type 2 diabetes mellitus with diabetic chronic kidney disease: Secondary | ICD-10-CM

## 2024-11-05 MED ORDER — FREESTYLE LIBRE 3 PLUS SENSOR MISC
11 refills | Status: AC
  Filled 2024-11-05 – 2024-11-14 (×4): qty 2, 30d supply, fill #0

## 2024-11-05 NOTE — Telephone Encounter (Signed)
" ° ° °  Local pharmacies are unable to process libre CGM Patient has libre 3 reader device and is on insulin  New RX sent to [facility] to see if we are able to bill part B for FSL3+ sensors  Mliss Tarry Griffin, PharmD, [place], CPP Clinical Pharmacist, [facility]  "

## 2024-11-05 NOTE — Telephone Encounter (Signed)
 Will send Libre CGM to [facility] Not calling cover my meds back.  This should go under part B

## 2024-11-10 ENCOUNTER — Ambulatory Visit: Payer: Self-pay | Admitting: Family Medicine

## 2024-11-10 ENCOUNTER — Encounter: Payer: Self-pay | Admitting: Family Medicine

## 2024-11-10 ENCOUNTER — Other Ambulatory Visit (HOSPITAL_COMMUNITY): Payer: Self-pay

## 2024-11-10 ENCOUNTER — Telehealth: Payer: Self-pay

## 2024-11-10 VITALS — BP 126/84 | HR 89 | Ht 74.0 in | Wt 194.0 lb

## 2024-11-10 DIAGNOSIS — Z89512 Acquired absence of left leg below knee: Secondary | ICD-10-CM | POA: Diagnosis not present

## 2024-11-10 DIAGNOSIS — M419 Scoliosis, unspecified: Secondary | ICD-10-CM

## 2024-11-10 DIAGNOSIS — E1122 Type 2 diabetes mellitus with diabetic chronic kidney disease: Secondary | ICD-10-CM

## 2024-11-10 DIAGNOSIS — Z23 Encounter for immunization: Secondary | ICD-10-CM

## 2024-11-10 DIAGNOSIS — I152 Hypertension secondary to endocrine disorders: Secondary | ICD-10-CM | POA: Diagnosis not present

## 2024-11-10 DIAGNOSIS — Z0289 Encounter for other administrative examinations: Secondary | ICD-10-CM | POA: Diagnosis not present

## 2024-11-10 DIAGNOSIS — I4811 Longstanding persistent atrial fibrillation: Secondary | ICD-10-CM

## 2024-11-10 DIAGNOSIS — M51362 Other intervertebral disc degeneration, lumbar region with discogenic back pain and lower extremity pain: Secondary | ICD-10-CM | POA: Diagnosis not present

## 2024-11-10 DIAGNOSIS — E785 Hyperlipidemia, unspecified: Secondary | ICD-10-CM | POA: Diagnosis not present

## 2024-11-10 DIAGNOSIS — Z794 Long term (current) use of insulin: Secondary | ICD-10-CM | POA: Diagnosis not present

## 2024-11-10 DIAGNOSIS — E1159 Type 2 diabetes mellitus with other circulatory complications: Secondary | ICD-10-CM

## 2024-11-10 DIAGNOSIS — E1142 Type 2 diabetes mellitus with diabetic polyneuropathy: Secondary | ICD-10-CM | POA: Diagnosis not present

## 2024-11-10 LAB — BAYER DCA HB A1C WAIVED: HB A1C (BAYER DCA - WAIVED): 8 % — ABNORMAL HIGH (ref 4.8–5.6)

## 2024-11-10 MED ORDER — HYDROCODONE-ACETAMINOPHEN 10-325 MG PO TABS: 1.0000 | ORAL_TABLET | Freq: Two times a day (BID) | ORAL | 0 refills | Status: AC | PRN

## 2024-11-10 MED ORDER — METOPROLOL SUCCINATE ER 25 MG PO TB24: 25.0000 mg | ORAL_TABLET | Freq: Every day | ORAL | 3 refills | Status: AC

## 2024-11-10 MED ORDER — DULOXETINE HCL 60 MG PO CPEP: 60.0000 mg | ORAL_CAPSULE | Freq: Every evening | ORAL | 3 refills | Status: AC | PRN

## 2024-11-10 MED ORDER — GABAPENTIN 600 MG PO TABS: 600.0000 mg | ORAL_TABLET | Freq: Three times a day (TID) | ORAL | 3 refills | Status: AC

## 2024-11-10 NOTE — Telephone Encounter (Signed)
 Pt seen in office today with Dettinger. Per Dettinger pt needs an appt with Julie and needs to discuss Ozempic  and how to help with cost.

## 2024-11-10 NOTE — Progress Notes (Signed)
 "  BP 126/84   Pulse 89   Ht 6' 2 (1.88 m)   Wt 194 lb (88 kg)   SpO2 96%   BMI 24.91 kg/m    Subjective:   Patient ID: [name], male    DOB: 06/22/1966, 59 y.o.   MRN: [id number]  HPI: [name] is a 59 y.o. male presenting on 11/10/2024 for Medical Management of Chronic Issues and Diabetes   Discussed the use of AI scribe software for clinical note transcription with the patient, who gave verbal consent to proceed.  History of Present Illness   [name] is a 59 year old male with diabetes who presents for medication management and follow-up.  Glycemic control difficulties - Difficulty managing diabetes due to recall of glucose monitoring device - Discontinued use of glucose monitor after recall notice indicating some batches gave falsely low readings - Uncertain if his device batch was affected - No current glucose monitoring since recall  Medication access issues - Challenges obtaining insulin  and Ozempic  due to changes in patient assistance programs - Removed from insulin  patient assistance program - Ozempic  no longer available through assistance program - Concerned about diabetes management without these medications - Received samples of Ozempic  and Eliquis , stored in refrigerator  Anticoagulation and cardiovascular medication supply - Running low on Eliquis  and metoprolol  - Plans to address medication supply with healthcare provider - Upcoming cardiology appointment scheduled for February or March  Chronic pain and prosthetic use - Takes hydrocodone  for back pain and Cymbalta  for nerve pain with effective relief - Uses gabapentin  at night for pain management related to prosthetics - Experiences pain when using prosthetics, managed with medication and by minimizing leg use - Applies lotion to legs to prevent sores and keeps legs elevated when resting  Preventive health maintenance - Due for colonoscopy - Due for eye exam          Relevant past  medical, surgical, family and social history reviewed and updated as indicated. Interim medical history since our last visit reviewed. Allergies and medications reviewed and updated.  Review of Systems  Constitutional:  Negative for chills and fever.  Eyes:  Negative for visual disturbance.  Respiratory:  Negative for shortness of breath and wheezing.   Cardiovascular:  Negative for chest pain and leg swelling.  Musculoskeletal:  Positive for arthralgias, back pain and gait problem.  Skin:  Negative for rash.  Neurological:  Negative for dizziness and light-headedness.  All other systems reviewed and are negative.   Per HPI unless specifically indicated above   Allergies as of 11/10/2024   No Known Allergies      Medication List        Accurate as of November 10, 2024  2:41 PM. If you have any questions, ask your nurse or doctor.          apixaban  5 MG Tabs tablet Commonly known as: ELIQUIS  Take 1 tablet (5 mg total) by mouth 2 (two) times daily.   atorvastatin  20 MG tablet Commonly known as: LIPITOR Take 1 tablet (20 mg total) by mouth daily.   DULoxetine  60 MG capsule Commonly known as: CYMBALTA  Take 1 capsule (60 mg total) by mouth at bedtime and may repeat dose one time if needed.   fluticasone  50 MCG/ACT nasal spray Commonly known as: FLONASE  Place 1 spray into both nostrils 2 (two) times daily as needed for allergies or rhinitis.   FreeStyle Libre 3 Plus Sensor Misc Apply sensor to back of  upper arm subcutaneously & change sensor every 15 days.   FreeStyle Libre 3 Reader [employer] Use with compatible [employer] 3 sensor to monitor glucose continuously. DX: E11.65   gabapentin  600 MG tablet Commonly known as: NEURONTIN  Take 1 tablet (600 mg total) by mouth 3 (three) times daily.   HYDROcodone -acetaminophen  10-325 MG tablet Commonly known as: NORCO Take 1 tablet by mouth 2 (two) times daily as needed. What changed: Another medication with the same name was  changed. Make sure you understand how and when to take each. Changed by: Fonda Levins, MD   HYDROcodone -acetaminophen  10-325 MG tablet Commonly known as: NORCO Take 1 tablet by mouth 2 (two) times daily as needed. Start taking on: December 10, 2024 What changed: These instructions start on December 10, 2024. If you are unsure what to do until then, ask your doctor or other care provider. Changed by: Fonda Levins, MD   HYDROcodone -acetaminophen  10-325 MG tablet Commonly known as: NORCO Take 1 tablet by mouth 2 (two) times daily as needed. Start taking on: January 07, 2025 What changed: These instructions start on January 07, 2025. If you are unsure what to do until then, ask your doctor or other care provider. Changed by: Fonda Levins, MD   metFORMIN  1000 MG tablet Commonly known as: GLUCOPHAGE  Take 1 tablet (1,000 mg total) by mouth 2 (two) times daily.   metoprolol  succinate 25 MG 24 hr tablet Commonly known as: TOPROL -XL Take 1 tablet (25 mg total) by mouth daily. Patient needs an appointment, 2nd attempt   NovoLOG  FlexPen 100 UNIT/ML FlexPen Generic drug: insulin  aspart Inject 5-10 Units into the skin 3 (three) times daily with meals.   Semaglutide  (2 MG/DOSE) 8 MG/3ML Sopn Inject 2 mg as directed once a week.   Tresiba  FlexTouch 200 UNIT/ML FlexTouch Pen Generic drug: insulin  degludec Inject 76 Units into the skin daily.         Objective:   BP 126/84   Pulse 89   Ht 6' 2 (1.88 m)   Wt 194 lb (88 kg)   SpO2 96%   BMI 24.91 kg/m   Wt Readings from Last 3 Encounters:  11/10/24 194 lb (88 kg)  08/08/24 199 lb 3.2 oz (90.4 kg)  05/07/24 201 lb (91.2 kg)    Physical Exam Vitals and nursing note reviewed.  Constitutional:      Appearance: Normal appearance.  Neurological:     Mental Status: He is alert.    Physical Exam   VITALS: P- 84, BP- 126/84 CHEST: Lungs clear to auscultation bilaterally. CARDIOVASCULAR: Regular heart rate and rhythm, no  murmurs.         Assessment & Plan:   Problem List Items Addressed This Visit       Cardiovascular and Mediastinum   Hypertension associated with diabetes (HCC)   Relevant Medications   metoprolol  succinate (TOPROL -XL) 25 MG 24 hr tablet   Other Relevant Orders   CBC with Differential/Platelet   CMP14+EGFR   Lipid panel   PSA, total and free   TSH   Bayer DCA Hb A1c Waived   Vitamin B12   A-fib (HCC)   Relevant Medications   metoprolol  succinate (TOPROL -XL) 25 MG 24 hr tablet     Endocrine   Neuropathy in diabetes (HCC)   Relevant Medications   DULoxetine  (CYMBALTA ) 60 MG capsule   gabapentin  (NEURONTIN ) 600 MG tablet   Type 2 diabetes mellitus (HCC) - Primary   Relevant Orders   CBC with Differential/Platelet   CMP14+EGFR  Lipid panel   PSA, total and free   TSH   Bayer DCA Hb A1c Waived   Vitamin B12     Musculoskeletal and Integument   DDD (degenerative disc disease), lumbar   Relevant Medications   HYDROcodone -acetaminophen  (NORCO) 10-325 MG tablet   HYDROcodone -acetaminophen  (NORCO) 10-325 MG tablet (Start on 12/10/2024)   HYDROcodone -acetaminophen  (NORCO) 10-325 MG tablet (Start on 01/07/2025)   Lumbar spine scoliosis     Other   Dyslipidemia   Pain management contract signed   Relevant Medications   HYDROcodone -acetaminophen  (NORCO) 10-325 MG tablet   HYDROcodone -acetaminophen  (NORCO) 10-325 MG tablet (Start on 12/10/2024)   HYDROcodone -acetaminophen  (NORCO) 10-325 MG tablet (Start on 01/07/2025)   S/P BKA (below knee amputation) unilateral, left (HCC)   Relevant Medications   HYDROcodone -acetaminophen  (NORCO) 10-325 MG tablet   HYDROcodone -acetaminophen  (NORCO) 10-325 MG tablet (Start on 12/10/2024)   HYDROcodone -acetaminophen  (NORCO) 10-325 MG tablet (Start on 01/07/2025)       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 Ozempic  unavailable, requiring increased  insulin . Cymbalta  effective for neuropathy. - Contact pharmacy to verify sensor recall and obtain replacements. - Increase regular insulin  dosage. - Continue Cymbalta . - A1c is 8.0 which is slightly improved from last time, continue to focus on insulin  regulation and management.  Hypertension associated with diabetes Blood pressure controlled at 126/84 mmHg.  Longstanding persistent atrial fibrillation Running low on Eliquis  and metoprolol . Cardiologist appointment scheduled for February or March. - Contact cardiologist for Eliquis  and metoprolol  samples. - Ensure cardiologist follow-up is scheduled.  Lumbar degenerative disc disease Pain managed with hydrocodone  and Cymbalta . - Continue hydrocodone  and Cymbalta .  Acquired absence of left leg below knee Prosthetic discomfort managed with gabapentin  and hydrocodone . Lotion used to prevent sores. - Continue gabapentin  and hydrocodone . - Apply lotion to prevent sores.  General health maintenance Due for colonoscopy and eye exam. - Schedule colonoscopy. - Schedule eye exam.     Urine drug screen and contract were done on 02/18/2024     Follow up plan: Return in about 3 months (around 02/08/2025), or if symptoms worsen or fail to improve, for Diabetes.  Counseling provided for all of the vaccine components Orders Placed This Encounter  Procedures   CBC with Differential/Platelet   CMP14+EGFR   Lipid panel   PSA, total and free   TSH   Bayer DCA Hb A1c Waived   Vitamin B12    Anthony Oleson, MD [facility] 11/10/2024, 2:41 PM     "

## 2024-11-11 ENCOUNTER — Other Ambulatory Visit (HOSPITAL_COMMUNITY): Payer: Self-pay

## 2024-11-11 LAB — CBC WITH DIFFERENTIAL/PLATELET
Basophils Absolute: 0.1 x10E3/uL (ref 0.0–0.2)
Basos: 1 %
EOS (ABSOLUTE): 0.3 x10E3/uL (ref 0.0–0.4)
Eos: 5 %
Hematocrit: 53 % — ABNORMAL HIGH (ref 37.5–51.0)
Hemoglobin: 17.8 g/dL — ABNORMAL HIGH (ref 13.0–17.7)
Immature Grans (Abs): 0 x10E3/uL (ref 0.0–0.1)
Immature Granulocytes: 0 %
Lymphocytes Absolute: 1.9 x10E3/uL (ref 0.7–3.1)
Lymphs: 30 %
MCH: 31.2 pg (ref 26.6–33.0)
MCHC: 33.6 g/dL (ref 31.5–35.7)
MCV: 93 fL (ref 79–97)
Monocytes Absolute: 0.5 x10E3/uL (ref 0.1–0.9)
Monocytes: 7 %
Neutrophils Absolute: 3.7 x10E3/uL (ref 1.4–7.0)
Neutrophils: 56 %
Platelets: 205 x10E3/uL (ref 150–450)
RBC: 5.7 x10E6/uL (ref 4.14–5.80)
RDW: 12 % (ref 11.6–15.4)
WBC: 6.5 x10E3/uL (ref 3.4–10.8)

## 2024-11-11 LAB — CMP14+EGFR
ALT: 26 IU/L (ref 0–44)
AST: 14 IU/L (ref 0–40)
Albumin: 4.3 g/dL (ref 3.8–4.9)
Alkaline Phosphatase: 183 IU/L — ABNORMAL HIGH (ref 47–123)
BUN/Creatinine Ratio: 15 (ref 10–24)
BUN: 14 mg/dL (ref 8–27)
Bilirubin Total: 0.9 mg/dL (ref 0.0–1.2)
CO2: 26 mmol/L (ref 20–29)
Calcium: 9.3 mg/dL (ref 8.6–10.2)
Chloride: 98 mmol/L (ref 96–106)
Creatinine, Ser: 0.93 mg/dL (ref 0.76–1.27)
Globulin, Total: 2.5 g/dL (ref 1.5–4.5)
Glucose: 213 mg/dL — ABNORMAL HIGH (ref 70–99)
Potassium: 4.9 mmol/L (ref 3.5–5.2)
Sodium: 143 mmol/L (ref 134–144)
Total Protein: 6.8 g/dL (ref 6.0–8.5)
eGFR: 94 mL/min/1.73

## 2024-11-11 LAB — VITAMIN B12: Vitamin B-12: 372 pg/mL (ref 232–1245)

## 2024-11-11 LAB — LIPID PANEL
Chol/HDL Ratio: 2.5 ratio (ref 0.0–5.0)
Cholesterol, Total: 72 mg/dL — ABNORMAL LOW (ref 100–199)
HDL: 29 mg/dL — ABNORMAL LOW
LDL Chol Calc (NIH): 27 mg/dL (ref 0–99)
Triglycerides: 74 mg/dL (ref 0–149)
VLDL Cholesterol Cal: 16 mg/dL (ref 5–40)

## 2024-11-11 LAB — TSH: TSH: 2.98 u[IU]/mL (ref 0.450–4.500)

## 2024-11-11 LAB — PSA, TOTAL AND FREE
PSA, Free Pct: 50 %
PSA, Free: 0.15 ng/mL
Prostate Specific Ag, Serum: 0.3 ng/mL (ref 0.0–4.0)

## 2024-11-11 NOTE — Telephone Encounter (Addendum)
 Novo Nordisk PAP assistance in progress for insulins. See 10/23/24 telephone encounter.  Current Ozempic  2mg  dose copay $546 for 28 day supply.  You think patient would have a decent payment plan if he contacts his insurance to set up the Medicare Payment Plan? Im not familiar with how they stretch these out!

## 2024-11-11 NOTE — Telephone Encounter (Signed)
 Rec'd completed patient portion of Novo Nordisk application.   Emailed provider pages to [employer] for completion.

## 2024-11-12 ENCOUNTER — Other Ambulatory Visit (HOSPITAL_COMMUNITY): Payer: Self-pay

## 2024-11-13 ENCOUNTER — Other Ambulatory Visit (HOSPITAL_COMMUNITY): Payer: Self-pay

## 2024-11-13 ENCOUNTER — Telehealth (HOSPITAL_COMMUNITY): Payer: Self-pay

## 2024-11-13 NOTE — Telephone Encounter (Signed)
 Pharmacy Patient Advocate Encounter   Received notification from Pt Calls Messages that prior authorization for FreeStyle Libre 3 Plus Sensor  is required/requested.   Insurance verification completed.   The patient is insured through [employer].   Per test claim: PA required; PA submitted to above mentioned insurance via Latent Key/confirmation #/EOC [facility] Status is pending

## 2024-11-14 ENCOUNTER — Other Ambulatory Visit: Payer: Self-pay

## 2024-11-14 ENCOUNTER — Other Ambulatory Visit (HOSPITAL_COMMUNITY): Payer: Self-pay

## 2024-11-14 NOTE — Telephone Encounter (Signed)
 Pharmacy Patient Advocate Encounter  Received notification from [facility] that Prior Authorization for FreeStyle Libre 3 Plus Sensor  has been APPROVED from 11/14/24 to 11/05/25. Ran test claim, Copay is $0. This test claim was processed through [facility]- copay amounts may vary at other pharmacies due to pharmacy/plan contracts, or as the patient moves through the different stages of their insurance plan.   PA #/Case ID/Reference #: [id number]

## 2024-11-17 ENCOUNTER — Ambulatory Visit: Payer: Self-pay | Admitting: Family Medicine

## 2024-11-21 NOTE — Telephone Encounter (Signed)
 Call returned to patient.

## 2024-11-24 ENCOUNTER — Other Ambulatory Visit: Payer: Self-pay | Admitting: Family Medicine

## 2024-11-24 DIAGNOSIS — E119 Type 2 diabetes mellitus without complications: Secondary | ICD-10-CM

## 2024-11-24 MED ORDER — TOUJEO SOLOSTAR 300 UNIT/ML ~~LOC~~ SOPN: 76.0000 [IU] | PEN_INJECTOR | Freq: Every day | SUBCUTANEOUS | 5 refills | Status: DC

## 2024-11-24 NOTE — Telephone Encounter (Signed)
 Sent prescription for the 76 units

## 2024-11-25 NOTE — Telephone Encounter (Signed)
 PAP: re-enrollment application for Novolog  Tresiba  has been submitted to [employer], via fax

## 2024-12-01 NOTE — Telephone Encounter (Signed)
 PAP: Patient assistance RE-ENROLLMENT application for Novolog  & Tresiba  has been approved by PAP Companies: [employer] from 11/27/24 to 11/05/25.   Medication should be delivered to PAP Delivery: Provider's office.   For further shipping updates, please contact [employer] at [phone].   Patient ID is: [id number]

## 2024-12-02 ENCOUNTER — Telehealth: Payer: Self-pay

## 2024-12-02 ENCOUNTER — Other Ambulatory Visit

## 2024-12-02 DIAGNOSIS — E1122 Type 2 diabetes mellitus with diabetic chronic kidney disease: Secondary | ICD-10-CM

## 2024-12-02 NOTE — Telephone Encounter (Signed)
 Any updates on this one? Tresiba /novolog . Was denied for LIS/extra help! Look at us  using already established encounters! :)

## 2024-12-02 NOTE — Telephone Encounter (Signed)
 Please submit application to [employer] for Trulicity 4.5 mg weekly. Patient previous enrolled in Ozempic  PAP with [employer]. Patient applied for LIS and was denied. Please route PAP approval to Mliss, PharmD. Thank you!

## 2024-12-09 ENCOUNTER — Ambulatory Visit: Admitting: Nurse Practitioner

## 2024-12-10 ENCOUNTER — Telehealth: Payer: Self-pay

## 2024-12-10 ENCOUNTER — Other Ambulatory Visit (HOSPITAL_COMMUNITY): Payer: Self-pay

## 2024-12-10 NOTE — Telephone Encounter (Signed)
 New encounter created for [employer] PAP

## 2024-12-10 NOTE — Telephone Encounter (Signed)
 NEW PAP:  Patient assistance application for Trulicity through [employer] has been emailed to [employer]

## 2025-02-03 ENCOUNTER — Ambulatory Visit: Payer: Self-pay

## 2025-02-09 ENCOUNTER — Ambulatory Visit: Admitting: Family Medicine

## 2025-02-10 ENCOUNTER — Ambulatory Visit

## 2025-02-24 ENCOUNTER — Ambulatory Visit: Admitting: Nurse Practitioner
